# Patient Record
Sex: Female | Born: 1955 | Race: White | Hispanic: No | Smoking: Former smoker
Health system: Southern US, Community
[De-identification: ages and names within clinical notes are randomized; demographics above are authoritative.]

## PROBLEM LIST (undated history)

## (undated) DIAGNOSIS — J449 Chronic obstructive pulmonary disease, unspecified: Secondary | ICD-10-CM

## (undated) DIAGNOSIS — G709 Myoneural disorder, unspecified: Secondary | ICD-10-CM

## (undated) DIAGNOSIS — D649 Anemia, unspecified: Secondary | ICD-10-CM

## (undated) DIAGNOSIS — J189 Pneumonia, unspecified organism: Secondary | ICD-10-CM

## (undated) DIAGNOSIS — G4730 Sleep apnea, unspecified: Secondary | ICD-10-CM

## (undated) DIAGNOSIS — E162 Hypoglycemia, unspecified: Secondary | ICD-10-CM

## (undated) DIAGNOSIS — R7303 Prediabetes: Secondary | ICD-10-CM

## (undated) DIAGNOSIS — M1990 Unspecified osteoarthritis, unspecified site: Secondary | ICD-10-CM

## (undated) DIAGNOSIS — E785 Hyperlipidemia, unspecified: Secondary | ICD-10-CM

## (undated) DIAGNOSIS — I471 Supraventricular tachycardia, unspecified: Secondary | ICD-10-CM

## (undated) DIAGNOSIS — T7840XA Allergy, unspecified, initial encounter: Secondary | ICD-10-CM

## (undated) DIAGNOSIS — K5790 Diverticulosis of intestine, part unspecified, without perforation or abscess without bleeding: Secondary | ICD-10-CM

## (undated) DIAGNOSIS — M810 Age-related osteoporosis without current pathological fracture: Secondary | ICD-10-CM

## (undated) DIAGNOSIS — F329 Major depressive disorder, single episode, unspecified: Secondary | ICD-10-CM

## (undated) DIAGNOSIS — J441 Chronic obstructive pulmonary disease with (acute) exacerbation: Secondary | ICD-10-CM

## (undated) DIAGNOSIS — B029 Zoster without complications: Secondary | ICD-10-CM

## (undated) DIAGNOSIS — Z9889 Other specified postprocedural states: Secondary | ICD-10-CM

## (undated) DIAGNOSIS — I499 Cardiac arrhythmia, unspecified: Secondary | ICD-10-CM

## (undated) DIAGNOSIS — K219 Gastro-esophageal reflux disease without esophagitis: Secondary | ICD-10-CM

## (undated) DIAGNOSIS — F419 Anxiety disorder, unspecified: Secondary | ICD-10-CM

## (undated) DIAGNOSIS — K589 Irritable bowel syndrome without diarrhea: Secondary | ICD-10-CM

## (undated) DIAGNOSIS — R0600 Dyspnea, unspecified: Secondary | ICD-10-CM

## (undated) DIAGNOSIS — F32A Depression, unspecified: Secondary | ICD-10-CM

## (undated) HISTORY — PX: SPINAL FUSION: SHX223

## (undated) HISTORY — PX: BREAST SURGERY: SHX581

## (undated) HISTORY — DX: Allergy, unspecified, initial encounter: T78.40XA

## (undated) HISTORY — DX: Other specified postprocedural states: Z98.890

## (undated) HISTORY — PX: BACK SURGERY: SHX140

## (undated) HISTORY — DX: Chronic obstructive pulmonary disease with (acute) exacerbation: J44.1

## (undated) HISTORY — PX: VULVECTOMY: SHX1086

## (undated) HISTORY — DX: Myoneural disorder, unspecified: G70.9

## (undated) HISTORY — DX: Supraventricular tachycardia: I47.1

## (undated) HISTORY — DX: Unspecified osteoarthritis, unspecified site: M19.90

## (undated) HISTORY — DX: Age-related osteoporosis without current pathological fracture: M81.0

## (undated) HISTORY — DX: Supraventricular tachycardia, unspecified: I47.10

## (undated) SURGERY — COLONOSCOPY
Anesthesia: Moderate Sedation

---

## 1997-06-08 ENCOUNTER — Ambulatory Visit (HOSPITAL_COMMUNITY): Admission: RE | Admit: 1997-06-08 | Discharge: 1997-06-08 | Payer: Self-pay | Admitting: *Deleted

## 1998-07-04 ENCOUNTER — Other Ambulatory Visit: Admission: RE | Admit: 1998-07-04 | Discharge: 1998-07-04 | Payer: Self-pay | Admitting: Gynecology

## 1999-08-20 ENCOUNTER — Other Ambulatory Visit: Admission: RE | Admit: 1999-08-20 | Discharge: 1999-08-20 | Payer: Self-pay | Admitting: Gynecology

## 2002-04-12 ENCOUNTER — Other Ambulatory Visit: Admission: RE | Admit: 2002-04-12 | Discharge: 2002-04-12 | Payer: Self-pay | Admitting: Gynecology

## 2002-05-06 HISTORY — PX: CARDIAC CATHETERIZATION: SHX172

## 2003-01-28 ENCOUNTER — Encounter: Payer: Self-pay | Admitting: Family Medicine

## 2003-01-28 ENCOUNTER — Ambulatory Visit (HOSPITAL_COMMUNITY): Admission: RE | Admit: 2003-01-28 | Discharge: 2003-01-28 | Payer: Self-pay | Admitting: Family Medicine

## 2003-04-18 ENCOUNTER — Other Ambulatory Visit: Admission: RE | Admit: 2003-04-18 | Discharge: 2003-04-18 | Payer: Self-pay | Admitting: Gynecology

## 2003-05-12 ENCOUNTER — Ambulatory Visit (HOSPITAL_COMMUNITY): Admission: RE | Admit: 2003-05-12 | Discharge: 2003-05-12 | Payer: Self-pay | Admitting: Family Medicine

## 2003-07-05 ENCOUNTER — Ambulatory Visit (HOSPITAL_COMMUNITY): Admission: RE | Admit: 2003-07-05 | Discharge: 2003-07-05 | Payer: Self-pay | Admitting: *Deleted

## 2003-10-04 ENCOUNTER — Emergency Department (HOSPITAL_COMMUNITY): Admission: EM | Admit: 2003-10-04 | Discharge: 2003-10-04 | Payer: Self-pay | Admitting: Emergency Medicine

## 2004-03-26 ENCOUNTER — Encounter (INDEPENDENT_AMBULATORY_CARE_PROVIDER_SITE_OTHER): Payer: Self-pay | Admitting: Specialist

## 2004-03-26 ENCOUNTER — Ambulatory Visit (HOSPITAL_COMMUNITY): Admission: RE | Admit: 2004-03-26 | Discharge: 2004-03-26 | Payer: Self-pay | Admitting: Gynecology

## 2004-04-23 ENCOUNTER — Other Ambulatory Visit: Admission: RE | Admit: 2004-04-23 | Discharge: 2004-04-23 | Payer: Self-pay | Admitting: Gynecology

## 2004-10-30 ENCOUNTER — Ambulatory Visit (HOSPITAL_COMMUNITY): Admission: RE | Admit: 2004-10-30 | Discharge: 2004-10-30 | Payer: Self-pay | Admitting: Gynecology

## 2005-04-11 ENCOUNTER — Ambulatory Visit (HOSPITAL_COMMUNITY): Admission: RE | Admit: 2005-04-11 | Discharge: 2005-04-11 | Payer: Self-pay | Admitting: Family Medicine

## 2006-05-26 ENCOUNTER — Ambulatory Visit: Payer: Self-pay | Admitting: Orthopedic Surgery

## 2006-05-27 ENCOUNTER — Encounter (HOSPITAL_COMMUNITY): Admission: RE | Admit: 2006-05-27 | Discharge: 2006-06-26 | Payer: Self-pay | Admitting: Orthopedic Surgery

## 2006-06-10 ENCOUNTER — Inpatient Hospital Stay (HOSPITAL_COMMUNITY): Admission: EM | Admit: 2006-06-10 | Discharge: 2006-06-18 | Payer: Self-pay | Admitting: Emergency Medicine

## 2006-06-26 ENCOUNTER — Ambulatory Visit (HOSPITAL_COMMUNITY): Admission: RE | Admit: 2006-06-26 | Discharge: 2006-06-26 | Payer: Self-pay | Admitting: Internal Medicine

## 2006-06-30 ENCOUNTER — Ambulatory Visit (HOSPITAL_COMMUNITY): Admission: RE | Admit: 2006-06-30 | Discharge: 2006-06-30 | Payer: Self-pay | Admitting: Family Medicine

## 2006-08-07 ENCOUNTER — Ambulatory Visit (HOSPITAL_COMMUNITY): Admission: RE | Admit: 2006-08-07 | Discharge: 2006-08-07 | Payer: Self-pay | Admitting: Neurosurgery

## 2007-06-03 ENCOUNTER — Encounter: Admission: RE | Admit: 2007-06-03 | Discharge: 2007-06-03 | Payer: Self-pay | Admitting: Neurosurgery

## 2007-06-08 ENCOUNTER — Encounter: Payer: Self-pay | Admitting: Orthopedic Surgery

## 2007-06-11 ENCOUNTER — Ambulatory Visit (HOSPITAL_COMMUNITY): Admission: RE | Admit: 2007-06-11 | Discharge: 2007-06-12 | Payer: Self-pay | Admitting: Neurosurgery

## 2007-11-25 ENCOUNTER — Ambulatory Visit (HOSPITAL_BASED_OUTPATIENT_CLINIC_OR_DEPARTMENT_OTHER): Admission: RE | Admit: 2007-11-25 | Discharge: 2007-11-25 | Payer: Self-pay | Admitting: Gynecology

## 2007-11-25 ENCOUNTER — Encounter (INDEPENDENT_AMBULATORY_CARE_PROVIDER_SITE_OTHER): Payer: Self-pay | Admitting: Gynecology

## 2008-07-28 ENCOUNTER — Emergency Department (HOSPITAL_COMMUNITY): Admission: EM | Admit: 2008-07-28 | Discharge: 2008-07-28 | Payer: Self-pay | Admitting: Emergency Medicine

## 2008-08-28 ENCOUNTER — Emergency Department (HOSPITAL_COMMUNITY): Admission: EM | Admit: 2008-08-28 | Discharge: 2008-08-28 | Payer: Self-pay | Admitting: Emergency Medicine

## 2009-04-11 ENCOUNTER — Ambulatory Visit (HOSPITAL_COMMUNITY)
Admission: RE | Admit: 2009-04-11 | Discharge: 2009-04-11 | Payer: Self-pay | Admitting: Physical Medicine and Rehabilitation

## 2010-05-06 DIAGNOSIS — B029 Zoster without complications: Secondary | ICD-10-CM

## 2010-05-06 HISTORY — DX: Zoster without complications: B02.9

## 2010-05-27 ENCOUNTER — Encounter: Payer: Self-pay | Admitting: Internal Medicine

## 2010-07-24 ENCOUNTER — Other Ambulatory Visit (HOSPITAL_COMMUNITY): Payer: Self-pay | Admitting: Internal Medicine

## 2010-07-26 ENCOUNTER — Ambulatory Visit (HOSPITAL_COMMUNITY)
Admission: RE | Admit: 2010-07-26 | Discharge: 2010-07-26 | Disposition: A | Discharge status: Home / Self Care | Payer: Medicare Other | Source: Ambulatory Visit | Attending: Internal Medicine | Admitting: Internal Medicine

## 2010-07-26 DIAGNOSIS — I319 Disease of pericardium, unspecified: Secondary | ICD-10-CM | POA: Insufficient documentation

## 2010-07-26 DIAGNOSIS — R109 Unspecified abdominal pain: Secondary | ICD-10-CM | POA: Insufficient documentation

## 2010-08-15 LAB — POCT CARDIAC MARKERS
CKMB, poc: 1.6 ng/mL (ref 1.0–8.0)
Myoglobin, poc: 42.4 ng/mL (ref 12–200)
Troponin i, poc: <0.05 ng/mL (ref 0.00–0.09)

## 2010-08-16 LAB — DIFFERENTIAL
Lymphocytes Relative: 10 % — ABNORMAL LOW (ref 12–46)
Lymphs Abs: 2 10*3/uL (ref 0.7–4.0)
Monocytes Relative: 6 % (ref 3–12)
Neutro Abs: 16.6 10*3/uL — ABNORMAL HIGH (ref 1.7–7.7)
Neutrophils Relative %: 83 % — ABNORMAL HIGH (ref 43–77)

## 2010-08-16 LAB — BASIC METABOLIC PANEL
BUN: 7 mg/dL (ref 6–23)
Chloride: 98 mEq/L (ref 96–112)
Creatinine, Ser: 0.68 mg/dL (ref 0.4–1.2)
GFR calc Af Amer: >60 mL/min (ref >60)
GFR calc non Af Amer: >60 mL/min (ref >60)
Potassium: 3.9 mEq/L (ref 3.5–5.1)

## 2010-08-16 LAB — CBC
MCV: 96.1 fL (ref 78.0–100.0)
Platelets: 192 10*3/uL (ref 150–400)
RBC: 4.19 MIL/uL (ref 3.87–5.11)
WBC: 19.9 10*3/uL — ABNORMAL HIGH (ref 4.0–10.5)

## 2010-08-16 LAB — POCT CARDIAC MARKERS
CKMB, poc: <1 ng/mL — ABNORMAL LOW (ref 1.0–8.0)
Myoglobin, poc: 29 ng/mL (ref 12–200)
Myoglobin, poc: 31.2 ng/mL (ref 12–200)
Troponin i, poc: <0.05 ng/mL (ref 0.00–0.09)

## 2010-09-18 NOTE — Op Note (Signed)
NAMEFALISA, LAMORA                ACCOUNT NO.:  [id number]   MEDICAL RECORD NO.:  [id number]         PATIENT TYPE:  HAMB   LOCATION:                               FACILITY:  [facility]   PHYSICIAN:  Luvenia Redden, M.D.   DATE OF BIRTH:  1955/09/14   DATE OF PROCEDURE:  11/25/2007  DATE OF DISCHARGE:                               OPERATIVE REPORT   PREOPERATIVE DIAGNOSIS:  Metrorrhagia.   POSTOPERATIVE DIAGNOSES:  1. Metrorrhagia.  2. Endometrial polyps.   OPERATION:  1. Hysteroscopy.  2. Dilation and curettage.   SURGEON:  Luvenia Redden, M.D.   PROCEDURE:  Under good anesthesia, the patient was prepped and draped in  a sterile manner.  Bimanual exam revealed the uterus anterior, normal  size, and no adnexal masses.  Uterus sounded to a depth of 3-1/2 inches.  Cervix was dilated.  The hysteroscope was introduced into the cervical  canal.  Using sorbitol as the distending medium, the scope was advanced,  and the uterine cavity was observed and evaluated.  There were multiple  small polyps, and a very shaggy looking endometrium.  No evidence of any  intracavitary fibroids.   The scope was retracted, and the endocervical canal and the endometrial  canal were thoroughly curetted with a moderate amount of tissue being  obtained.  The endometrial cavity was then wiped with a dry sponge and  the procedure was terminated.  Blood loss was estimated at 25 mL, none  was replaced.  The fluid deficit was 40 mL.  The patient tolerated the  procedure well, and was removed to recovery in good condition.           ______________________________  Luvenia Redden, M.D.     WSB/MEDQ  D:  11/25/2007  T:  11/25/2007  Job:  [id number]

## 2010-09-21 NOTE — Group Therapy Note (Signed)
NAMECARAL, WHAN                ACCOUNT NO.:  [id number]   MEDICAL RECORD NO.:  [id number]          PATIENT TYPE:  INP   LOCATION:  A201                          FACILITY:  [facility]   PHYSICIAN:  Edward L. Juanetta Gosling, M.D.DATE OF BIRTH:  1956-01-06   DATE OF PROCEDURE:  DATE OF DISCHARGE:                                 PROGRESS NOTE   Patient of [facility]   SUBJECTIVE:  Ms. Belmar says she is feeling a little bit better.  She  continues to be somewhat short of breath, and does not feel as well as  she would like, but she is doing a little better.   PHYSICAL EXAMINATION:  Shows her chest is clear.  Her heart is regular.  Still with a mild tachycardia.  She still has  some prolonged expiration but she is not wheezing.  Her abdomen is soft.  Her extremities showed no edema.   ASSESSMENT:  She has chronic obstructive pulmonary disease with  exacerbation.  She is, I think, slowly improving.      Edward L. Juanetta Gosling, M.D.  Electronically Signed     ELH/MEDQ  D:  06/16/2006  T:  06/16/2006  Job:  [id number]

## 2010-09-21 NOTE — Op Note (Signed)
[name], [name]                ACCOUNT NO.:  [id number]   MEDICAL RECORD NO.:  [id number]          PATIENT TYPE:  OIB   LOCATION:  3534                         FACILITY:  [facility]   PHYSICIAN:  Cristi Loron, M.D.DATE OF BIRTH:  [birth year]   DATE OF PROCEDURE:  DATE OF DISCHARGE:  06/12/2007                               OPERATIVE REPORT   BRIEF HISTORY:  The patient is a 55 year old white female who has  suffered from neck and left arm pain consistent with left C7  radiculopathy.  She failed medical management and was worked up with a  cervical MRI, which demonstrated herniated disk at C6-C7 on the left.  I  discussed the various treatment options with the patient including  surgery.  The patient is aware of the risks, benefits, and alternatives  of surgery, so I will proceed with C6-C7 anterior cervical diskectomy,  fusion, plating, and placement of interbody prosthesis.   PREOPERATIVE DIAGNOSES:  C6-C7 herniated nucleus pulposus, spondylosis,  stenosis, cervical radiculopathy/myelopathy, and cervicalgia.   POSTOPERATIVE DIAGNOSES:  C6-C7 herniated nucleus pulposus, spondylosis,  stenosis, cervical radiculopathy/myelopathy, and cervicalgia.   PROCEDURE:  C6-C7 extensive anterior cervical diskectomy/decompression;  C6-C7 anterior interbody arthrodesis with local morselized autograft  bone Actifuse bone graft extender; C6-C7 interbody  prosthesis (Alphatec  PEEK interbody prosthesis); C6-7 anterior cervical plating with Codman  Slim-Loc  titanium plate and screws.   SURGEON:  Cristi Loron, M.D.   ASSISTANT:  Hilda Lias, M.D.   ANESTHESIA:  General endotracheal.   ESTIMATED BLOOD LOSS:  50 cc.   SPECIMENS:  None.   DRAINS:  None.   COMPLICATIONS:  None.   DESCRIPTION OF PROCEDURE:  The patient was brought to the operating room  by the anesthesia team.  General endotracheal anesthesia was induced.  The patient remained in supine position.  A roll was  placed on her  shoulders placing the neck in slight extension.  Her anterior cervical  region was then prepared with Betadine and scrubbed with Betadine  solution.  Sterile drapes were applied and then injected the area to be  incised with Marcaine with epinephrine solution.  We used a scalpel to  make a transverse incision in the patient's left anterior neck.  I used  the Metzenbaum scissors to divide the platysma muscle and then dissected  medial sternocleidomastoid muscle, jugular vein, and carotid artery.  I  carefully dissected down towards the anterior cervical spine identifying  the esophagus and retracted it medially.  We then used kitner swabs to  clear soft tissue from the anterior cervical spine and then we inserted  a bent spinal needle into the upper exposed intervertebral disk space.  We obtained intraoperative radiograph to confirm our location.   We then used electrocautery to detach the medial border of the longus  colli muscle bilaterally from C6-C7 intervertebral disk space.  We  inserted the Caspar self-retaining retractor underneath the longus colli  muscle bilaterally to provide exposure.  We then incised the C6-C7  intervertebral disk with 15-blade scalpel and performed partial  intervertebral diskectomy with pituitary forceps and Carlens curettes.  We  then inserted distraction screws into C6-C7 interspace and then used  a high-speed drill to decorticate the vertebral endplates at C6-C7  to  drill away the remainder of C6-C7 intervertebral disk, to drill away  some posterior spondylosis, and to thin out the posterior longitudinal  ligament.  We then incised the ligament with an arachnoid knife  undercutting the vertebral endplates decompressing the thecal sac.  We  then performed foraminotomies of bilateral C7 nerve root completing the  decompression.  Of note, we encountered a herniated disk and spondylosis  on the left as expected compressing the left C7 nerve  root.   Having completed the decompression, we now turned attention to the  arthrodesis.  We used trial spacers and determined to use a small PEEK  interbody prosthesis.  We prefilled the prosthesis with a combination of  local autograft bone we obtained during the decompression as well as  Actifuse bone graft extender.  We inserted the prosthesis, distracted C6-  C7 interspace and then removed distraction screws.  There was a good  snug fit of the prostheses in the interspace.   We now turned attention to the anterior instrumentation.  We used a high-  speed drill to remove some ventral spondylosis from the vertebral  endplates at C6-C7, so that the plate lay down flat.  We then selected  the appropriate-length Codman Slim-Loc anterior cervical plate, laid it  along the anterior aspect of intravertebral bodies at C6 and C7.  We  then drilled two 12-mm holes at C6-C7 and secured the plate to the  vertebral bodies by placing two 12 mm self-tapping screws to C6-C7.  We  obtained intraoperative radiograph.  There was limited visualization of  the plate screws and interbody prostheses, but they look good in vivo.  We therefore secured the screws and plate by locking each again.   We then obtained hemostasis using bipolar cautery, irrigated the wound  out with bacitracin solution, and removed the retractor.  We then re-  inspected the esophagus for any damage, there was none apparent.  We  then reapproximated the patient's platysmal muscle with interrupted 3-0  Vicryl suture, subcutaneous tissue with interrupted 3-0 Vicryl suture,  and the skin with Steri-Strips and Benzoin.  The wound was then coated  with bacitracin ointment.  Sterile dressing applied.  The drapes were  removed.  The patient was subsequently extubated by the anesthesia team  and transported to post-anesthesia care unit in stable condition.  All  sponge, instrument, and needle counts were correct at the end of the   case.      Cristi Loron, M.D.  Electronically Signed     JDJ/MEDQ  D:  06/15/2007  T:  06/16/2007  Job:  [id number]

## 2010-09-21 NOTE — Cardiovascular Report (Signed)
NAME:  [name], [name]                          ACCOUNT NO.:  [id number]   MEDICAL RECORD NO.:  [id number]                   PATIENT TYPE:  OIB   LOCATION:  2854                                 FACILITY:  [facility]   PHYSICIAN:  Darlin Priestly, M.D.             DATE OF BIRTH:  [birth year]   DATE OF PROCEDURE:  07/05/2003  DATE OF DISCHARGE:  07/05/2003                              CARDIAC CATHETERIZATION   PROCEDURES:  1. Left heart catheterization.  2. Coronary angiography.  3. Left ventriculogram.  4. Abdominal aortogram.   ATTENDING PHYSICIAN:  Darlin Priestly, M.D.   COMPLICATIONS:  None.   INDICATIONS:  [name] is a 55 year old female patient of Dr. 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  She did undergo a dobutamine  Cardiolite which revealed evidence of mild anterior wall thinning with  suggestion of mild anterior wall ischemia toward the apex and normal EF.  She is now referred for cardiac catheterization to define her coronary  anatomy.   DESCRIPTION OF OPERATION:  After obtaining informed written consent, the  patient was brought to the cardiac catheterization laboratory.  Right and  left groin was shaved and prepped and draped in the usual sterile fashion.  ECG monitor was established.  Using the modified Seldinger technique, a 6  French arterial sheath was inserted in the right femoral artery. A 6 French  diagnostic catheter was then used to performed diagnostic angiography.  This  reveals a medium size short left main with no significant disease.  The LAD  is a large vessel that coursed to the apex and gives rise to one diagonal  branch and one large septal perforator.  The LAD has no significant disease.  First diagonal is a medium size vessel  which bifurcates distally with no  significant disease.  There is a medium size septal perforator with a 50%  ostial lesion.   Left circumflex is a large vessel that coursed in the AV groove and gives  rise to three obtuse marginal branches.  The AV groove circumflex has no  significant disease.   The first OM is a large vessel which bifurcates distally with no significant  disease.  Second and third OMs are small vessels with no significant  disease.   The right coronary artery is a large vessel.  It is dominant.  It gives rise  to both PDA as well as posterior lateral branch.  There is mild 30% proximal  RCA narrowing.  The PDA and posterior lateral branch have no significant  disease.   Left ventriculogram reveals a preserved EF at 60%.   Abdominal aortogram reveals minimal distal aortic plaque with widely patent  renal arteries.   HEMODYNAMICS:  Systemic arterial pressure 117/75, LV pressure  118/9, LVEDP  of 13.   CONCLUSIONS:  1. No significant coronary artery disease.  2. Normal left ventricular systolic function.  3. No evidence of renal artery stenosis with minimal plaque in the distal     descending aorta.                                               Darlin Priestly, M.D.    RHM/MEDQ  D:  07/05/2003  T:  07/06/2003  Job:  [id number]   cc:   Corrie Mckusick, M.D.  [address] [place] [place]  [place]  [place] [address]  Fax: [phone]

## 2010-09-21 NOTE — Op Note (Signed)
NAMEANNICE, JOLLY                ACCOUNT NO.:  [id number]   MEDICAL RECORD NO.:  [id number]          PATIENT TYPE:  INP   LOCATION:  A201                          FACILITY:  [facility]   PHYSICIAN:  Ky Barban, M.D.DATE OF BIRTH:  1955-07-05   DATE OF PROCEDURE:  06/17/2006  DATE OF DISCHARGE:                               OPERATIVE REPORT   PREOPERATIVE DIAGNOSES:  Difficulty to void and hesitancy.   POSTOPERATIVE DIAGNOSES:  Difficulty to void and hesitancy.   PROCEDURE:  Cystoscopy, dilatation.   SURGEON:  Ky Barban, M.D.   DESCRIPTION OF PROCEDURE:  The patient was given general endotracheal  anesthesia and placed in the lithotomy position.  After the usual prep  and drape, a #25 cystoscope was introduced.  The urethra was tight so I  dilated it to a 30-French, and the cystoscope was then introduced.  The  bladder was inspected.  No tumor, stone or foreign body or inflammation.  Both orifices were located and were of normal size with clear efflux.  The urethra also looked normal.  The cystoscope was removed.  The  urethra was dilated with a 32-French.  A bimanual pelvic exam was  unremarkable.   The patient left the operating room in satisfactory condition.      Ky Barban, M.D.  Electronically Signed     MIJ/MEDQ  D:  06/17/2006  T:  06/17/2006  Job:  [id number]

## 2010-09-21 NOTE — Op Note (Signed)
[name], [name]                ACCOUNT NO.:  [id number]   MEDICAL RECORD NO.:  [id number]          PATIENT TYPE:  AMB   LOCATION:  SDC                           FACILITY:  [facility]   PHYSICIAN:  Luvenia Redden, M.D.   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   OPERATION:  Incision and destruction of chronic draining sinus tracts.   SURGEON:  Luvenia Redden, M.D.   PROCEDURE:  Under good sedation, the area of the left vulva was injected  with 1% lidocaine to achieve anesthesia.  There were two sinus tracts with  pinpoint openings.  These were probed.  The first was about a centimeter  long and the second one was about 1.5 cm long to probe.  These areas were  opened with a small iris scissors to open up the tract completely.  The  tract was then coagulated with electrocoagulation.  The wound was then  packed with Neosporin ointment copiously and it will be left to heal by a  second intention.  No dressing was applied.  Blood loss was nil.  The  procedure was terminated and the patient was removed to recovery in good  condition.       WSB/MEDQ  D:  10/30/2004  T:  10/30/2004  Job:  [id number]

## 2010-09-21 NOTE — Op Note (Signed)
[name], [name]                ACCOUNT NO.:  [id number]   MEDICAL RECORD NO.:  [id number]          PATIENT TYPE:  AMB   LOCATION:  SDC                           FACILITY[facility]   PHYSICIAN:  Luvenia Redden, M.D.   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   OPERATION:  Excision of sinus tract and chronic draining vulvar abscess.   SURGEON:  Luvenia Redden, M.D.   PROCEDURE IN DETAIL:  Under good anesthesia, the patient was prepped and  draped in a sterile manner.  The sinus tract was probed with small forceps  and actually was probed to depth of approximately 1-1/2 inch.  This was an  awful lot longer than our exam revealed in the office but at any rate, the  depth of the tract as ascertained.  Following this, a tract was opened  sharply with a pair of small scissors.  Once the extent of the region could  then be determined, the entire tract was excised completely using a scalpel.  This left a defect approximately 1-3/4 inches in length on the left labia  extending down into the subcutaneous tissues.  Bleeders were  electrocoagulated.  The area was irrigated copiously with warm saline.  Additional bleeders were coagulated.  The subcutaneous tissue was then  closed using layers of interrupted 4-0 Vicryl suture.  The skin was then  closed using 4-0 Vicryl horizontal mattress sutures to close the dead space  and close the skin.  The area was again irrigated with saline, 1% lidocaine  with some epinephrine was then injected locally for pain control.  Estimated  blood loss was about 50 mL.  None was replaced.  Specimen was sent to  pathology for examination.  The patient tolerated the procedure well.  She  was moved to recovery in good condition.      WSB/MEDQ  D:  03/26/2004  T:  03/26/2004  Job:  [id number]

## 2010-09-21 NOTE — Group Therapy Note (Signed)
[name], [name]                ACCOUNT NO.:  [id number]   MEDICAL RECORD NO.:  [id number]          PATIENT TYPE:  INP   LOCATION:  A201                          FACILITY:  [facility]   PHYSICIAN:  Edward L. Juanetta Gosling, M.D.DATE OF BIRTH:  1955-08-21   DATE OF PROCEDURE:  DATE OF DISCHARGE:                                 PROGRESS NOTE   Ms. [name] says she is feeling a little better, still very tired when she  moves around.   Her exam shows temperature is 98.9, pulse 93, respirations 18, blood  pressure 109/62, 02 saturation is 94% on room air.  Her chest is clear.  Heart is regular.  Abdomen is soft.   White count is down to 16,100, platelets 287.  Her electrolytes are  normal with the exception of a C02 of 33.   ASSESSMENT:  I think she is overall better.   PLAN:  I am going to try to get her up and moving and see what happens.  She complains of being very tired when she gets up.  I want to see if  she develops hypoxia with ambulation.      Edward L. Juanetta Gosling, M.D.  Electronically Signed     ELH/MEDQ  D:  06/17/2006  T:  06/17/2006  Job:  [id number]

## 2010-09-21 NOTE — Consult Note (Signed)
NAMEGEANA, LELLA                ACCOUNT NO.:  [id number]   MEDICAL RECORD NO.:  [id number]          PATIENT TYPE:  INP   LOCATION:  A201                          FACILITY:  [facility]   PHYSICIAN:  Edward L. Juanetta Gosling, M.D.DATE OF BIRTH:  03/31/1956   DATE OF CONSULTATION:  DATE OF DISCHARGE:                                 CONSULTATION   REASON FOR CONSULTATION:  COPD.   HISTORY:  Ms. Stefanick is a 55 year old who has had shortness of breath for  about a week or so.  She had been having fever at home last week but  none this.  She has not had any hemoptysis.  She did have some right  shoulder pain in December, and it was felt to be some sort of a  arthritic problem.  She has been trying to stop smoking using Nicoderm  patches.  Almost immediately before I arrived she had an episode of  supraventricular tachycardia which apparently she has had in the past  and said it felt a little different made her feel like she was going to  pass out.  Her heart rate jumped up into the 130 range.  It had been in  the 100 range.  She has been on theophylline.  She has been on  nebulizers.   PAST MEDICAL HISTORY:  Is positive for:  1. SVT.  2. COPD and asthma exacerbations.  3. Depression.  4. Hyperlipidemia.   SOCIAL HISTORY:  She does have a history of smoking cigarettes but is  working on reducing her consumption of cigarettes.   FAMILY HISTORY:  Her mother had COPD and congestive heart failure,  severe, and eventually died of complications from an MI.  She has not  had any complaints with her legs, except her ankles were somewhat  painful yesterday.   PHYSICAL EXAMINATION:  GENERAL:  She looks tachypneic.  VITAL SIGNS:  Her heart rate is about 120-130.  Her respirations are  rate of 18, blood pressure 106/64.  She is afebrile.  O2 sat 91% on room  air.  HEENT:  Her pupils are reactive.  Her nose and throat are clear.  NECK:  Supple without masses.  No bruits.  No jugular venous distension.  CHEST:  Pretty clear without wheezes, rales or rhonchi.  Her chest shows  decreased breath sounds and wheezing bilaterally.  HEART:  Regular with a tachycardia.  EXTREMITIES:  Showed no edema.  ABDOMEN:  Soft.   Her white blood count 24,300, hemoglobin 14.3, platelets 344.  D-dimer  on admission was 0.22, normal.  Electrolytes are normal.  BUN 13,  creatinine 0.7.  Theophylline level this morning was 9.8.   ASSESSMENT:  1. She has chronic obstructive pulmonary disease.  2. She has had episodes of supraventricular tachycardia in the past.      She has what looks like a sinus tach now.   However, I think we should go ahead do a ventilation/perfusion lung  scan.  I think she is low probability.  But I will go ahead and get a  TSH, get an EKG, a cardiac panel, and  make sure, since she did have a  near syncope with this episode.  I have discussed her situation with Dr.  Sherwood Gambler.      Ramon Dredge L. Juanetta Gosling, M.D.  Electronically Signed     ELH/MEDQ  D:  06/13/2006  T:  06/13/2006  Job:  [id number]

## 2010-09-21 NOTE — H&P (Signed)
NAME:  [name], [name] NO.:  [id number]   MEDICAL RECORD NO.:  [id number]                   PATIENT TYPE:  OIB   LOCATION:  2854                                 FACILITY:  [facility]   PHYSICIAN:  Darlin Priestly, M.D.             DATE OF BIRTH:  1955-08-01   DATE OF ADMISSION:  07/05/2003  DATE OF DISCHARGE:                                HISTORY & PHYSICAL   Ms. Kusel is a 55 year old female followed by Dr. Assunta Found who was  referred to Dr. Jenne Campus on June 01, 2003, for evaluation of  PSVT.  She  has a history of COPD.  She has had intermittent episodes of a heart rate of  170.  She was seen by Dr. Jenne Campus June 01, 2003.  She was set up for  echocardiogram  and Cardiolite study. Cardiolite study done June 14, 2003, revealed suggestion of anterior wall ischemia with good LV function.  Echocardiogram  done June 14, 2003, revealed normal LV function.  She is  admitted now for diagnostic catheterization. She would have had this sooner  except she had an upper respiratory tract infection, apparently she has  fairly significant asthmatic type COPD.  She denies any fever at the last 48  hours.   PAST MEDICAL HISTORY:  1. COPD.  2. PSVT.   ALLERGIES:  1. PENICILLIN.  2. IV CONTRAST which caused her to have hypotension and nausea and vomiting.   SOCIAL HISTORY:  She is divorced. She has two daughters, both of which were  at medical school.  She works at [employer] area of the [place].  She smokes half a pack a day.  She denies alcohol.   FAMILY HISTORY:  The patient's father is alive at 2 with COPD.  Mother is  alive at 65 with diabetes.  She has one sister in good health.   REVIEW OF SYMPTOMS:  Essentially unremarkable except for as noted above.  She denies any claudication.   PHYSICAL EXAMINATION:  GENERAL APPEARANCE:  She is a well-developed, well-  nourished female in no acute distress.  VITAL SIGNS:  Blood pressure  121/73, pulse 92, temperature 98.6.  HEENT:  Normocephalic.  Extraocular movements intact.  Sclerae are  anicteric.  Lids and conjunctivae within normal limits.  NECK:  Without JVD, without bruit.  CHEST:  Clear to auscultation and percussion.  There is no wheezing.  CARDIOVASCULAR:  Regular rate and rhythm without murmurs, rubs, or gallops.  Normal S1 and S2.  ABDOMEN:  Nontender, no hepatosplenomegaly.  EXTREMITIES:  Without edema.  Distal pulses are diminished bilaterally.  Could not appreciate femoral bruits.  NEUROLOGIC:  Grossly intact.  She is awake, alert and oriented and  cooperative.  She moves lower extremities without obvious deficit.   EKG from the office showed sinus rhythm without acute changes.   IMPRESSION:  1. Abnormal Cardiolite study, rule out cornua disease.  2. History of paroxysmal supraventricular tachycardia.  3. Asthmatic chronic obstructive pulmonary disease.  4. IV contrast allergy and penicillin allergy.  5. Question of peripheral vascular disease with diminished distal pulses on     examination.   PLAN:  The patient is to be admitted for diagnostic catheterization. We did  order some Tussionex.  She has been premedicated with steroids, Pepcid and  Benadryl.  Will also check a lipid panel.      Abelino Derrick, P.A.                      Darlin Priestly, M.D.    Lenard Lance  D:  07/05/2003  T:  07/05/2003  Job:  [id number]

## 2010-09-21 NOTE — Group Therapy Note (Signed)
NAMEMARGART, [name]                ACCOUNT NO.:  [id number]   MEDICAL RECORD NO.:  [id number]          PATIENT TYPE:  INP   LOCATION:  A201                          FACILITY:  [facility]   PHYSICIAN:  Edward L. Juanetta Gosling, M.D.DATE OF BIRTH:  Sep 06, 1955   DATE OF PROCEDURE:  DATE OF DISCHARGE:                                 PROGRESS NOTE   PROBLEM:  COPD.  A patient Dr. Sherwood Gambler.  [name] is overall about the  same.  She had some sort of an allergic reaction this morning.  She says  she feels a little bit less congested and she is having a little bit  less symptoms otherwise.   EXAM:  She is awake and alert.  Temperature is 97.7, pulse 81, respirations 20, blood pressure 98/58, O2  sats 96% on 2L.  Her chest is clearer.  She is moving air better.   ASSESSMENT:  She is improving.   PLAN:  Continue current treatments and medications, and follow.      Edward L. Juanetta Gosling, M.D.  Electronically Signed     ELH/MEDQ  D:  06/15/2006  T:  06/15/2006  Job:  [id number]

## 2010-09-21 NOTE — Group Therapy Note (Signed)
[name], [name]                ACCOUNT NO.:  [id number]   MEDICAL RECORD NO.:  [id number]          PATIENT TYPE:  INP   LOCATION:  A201                          FACILITY:  [facility]   PHYSICIAN:  Edward L. Juanetta Gosling, M.D.DATE OF BIRTH:  08-05-1955   DATE OF PROCEDURE:  DATE OF DISCHARGE:                                 PROGRESS NOTE   Ms. Pherigo is doing a little better I think.  She still has an elevated  heart rate about 116, but she feels a little bit better.  She is moving  air better.  Her chest is clearer than it was.  She says she is worried  about taking the albuterol tablet, because she said something she took  in pill form, and she seems to think it may have an albuterol, caused  her throat to swell in the past, so I have gone ahead and discontinued  that, even though it has just been ordered by Dr. Sherwood Gambler.  Otherwise, she  is awake and alert.  Temperature is 98.2, pulse was 90, now about 116,  respirations 24, blood pressure 110/63, O2 SATs 97% on 2 liters.  White  count is down to 19,300.  Electrolytes are normal.  Cardiac enzymes are  negative.  TSH normal.  Theophylline level was only 3.9.   ASSESSMENT:  I have think she is slowly improving.  Plan is to continue  with treatments and follow.      Edward L. Juanetta Gosling, M.D.  Electronically Signed     ELH/MEDQ  D:  06/14/2006  T:  06/14/2006  Job:  [id number]

## 2010-09-21 NOTE — Discharge Summary (Signed)
NAMESANDY, HAYE                ACCOUNT NO.:  [id number]   MEDICAL RECORD NO.:  [id number]          PATIENT TYPE:  INP   LOCATION:  A201                          FACILITY:  [facility]   PHYSICIAN:  Madelin Rear. Sherwood Gambler, MD  DATE OF BIRTH:  1955-07-09   DATE OF ADMISSION:  06/10/2006  DATE OF DISCHARGE:  02/13/2008LH                               DISCHARGE SUMMARY   DISCHARGE DIAGNOSES:  1. Acute exacerbation of chronic obstructive pulmonary disease,      severe.  2. Tobacco abuse.  3. Depression.  4. Hyperlipidemia.  5. Cardiac arrhythmia.  6. Adverse effects to medication.  7. Urethral stricture.  8. Stress incontinence.   DISCHARGE MEDICATIONS:  1. Advair 50/250 one inhalation b.i.d.  2. Lexapro 20 mg p.o. daily.  3. Aspirin 81 mg p.o. daily.  4. Vytorin 10/40 p.o. daily.  5. Zyrtec 10 daily p.r.n.  6. Spiriva 1 inhalation daily.  7. Nystatin p.o. q.i.d.  8. Xopenex nebulizer 1.25 nebulizer q.i.d.  9. Tussionex p.o. q.i.d. one teaspoon.  10.Advil p.r.n.  11.Prednisone 20 mg p.o. daily times one week.  12.Nicotine patch.  13.Xanax 0.25 mg p.o. q.i.d. p.r.n. anxiety.   SUMMARY:  The patient was admitted with severe acute dyspnea.  She was  medicated at home and was unsuccessful in resolving her symptoms.  On  the day of presentation, she was extremely dyspneic in the emergency  department and failed to resolve with nebulizers there.   PAST MEDICAL HISTORY:  1. Notable for SVTs in the past.  2. Chronic obstructive pulmonary disease.  3. Asthma exacerbation.  4. Depression.  5. Hyperlipidemia.   Her response to bronchodilators parenterally, as well as by inhalation,  as well as high-dose IV steroids, was one of gradual improvement in  house.  She was seen in consultation by pulmonology with my gratitude.  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  This was  secondary to urethral stricture.  On a  gradual day-by-day basis, she did improve from a pulmonary standpoint.  She was discharged in stable condition on the above aforementioned  medications and will follow-up in the office 1 week post discharge.      Madelin Rear. Sherwood Gambler, MD  Electronically Signed     LJF/MEDQ  D:  07/16/2006  T:  07/17/2006  Job:  [id number]

## 2010-09-21 NOTE — H&P (Signed)
[name], [name]                ACCOUNT NO.:  [id number]   MEDICAL RECORD NO.:  [id number]          PATIENT TYPE:  INP   LOCATION:  A201                          FACILITY:  [facility]   PHYSICIAN:  Madelin Rear. Sherwood Gambler, MD  DATE OF BIRTH:  [birth year]   DATE OF ADMISSION:  06/10/2006  DATE OF DISCHARGE:  LH                              HISTORY & PHYSICAL   CHIEF COMPLAINT:  Shortness of breath.   HISTORY OF PRESENT ILLNESS:  The patient had approximately one week of  progressively increasing shortness of breath.  She has been attempting  to quit smoking with the use of Nicoderm patches.  She has cut down her  consumption.  She admits to a cough but no fever, rigors, or chills.  No  hemoptysis.   PAST MEDICAL HISTORY:  She had SVT, previous COPD/asthma exacerbations,  depression, and hyperlipidemia.   SOCIAL HISTORY:  Positive for cigarette smoking as outlined above, she  is decreasing consumption.   FAMILY HISTORY:  Noncontributory.   REVIEW OF SYSTEMS:  In detail is outlined as under HPI, all else is  negative.  Specifically, she denied any leg swelling or DVT symptoms.   PHYSICAL EXAMINATION:  GENERAL:  She is tachypneic with accessory muscle  use and paradoxical respirations.  HEAD AND NECK:  No JVD or adenopathy.  Neck is supple.  CHEST:  Diffuse wheezing, prolonged expiratory phase, with obvious  obstructive symptoms.  No rales.  CARDIAC:  Regular rhythm, distant, no gallop or rub appreciated.  ABDOMEN:  Benign.  EXTREMITIES:  No cyanosis, clubbing, and edema.  NEUROLOGICAL:  Exam nonfocal.  PSYCHIATRIC:  Normal, alert and cooperative.   LABORATORY DATA:  Chest x-ray shows marked COPD changes but no  infiltrate or other acute pathology.  Lab work revealed an extremely  elevated white count greater than 20,000 at 24.3.  She had a normal  platelet count and normal H&H, positive left shift with 93% polys.  Electrolytes were unrevealing.  Arterial blood gas revealed pH 7.43,  pCO2 43.7, pO2 75.5 with oxygen saturation of 94%.  This was on FIO2 of  28% (2 liters nasal cannula).   IMPRESSION/PLAN:  1. The patient will be admitted for more aggressive bronchodilators,      IV bronchodilators, around the clock nebs.  Antibiotic empiric      coverage with a white count so high.  This may be steroid affect,      however, better safe than      sorry.  2. Hyperlipidemia, no acute intervention at present.  3. Depression.  Continue her Lexapro as ordered at home, 20 mg daily.      Madelin Rear. Sherwood Gambler, MD  Electronically Signed     LJF/MEDQ  D:  06/10/2006  T:  06/10/2006  Job:  [id number]

## 2010-09-21 NOTE — Consult Note (Signed)
NAMEJESSA, [name]                ACCOUNT NO.:  [id number]   MEDICAL RECORD NO.:  [id number]          PATIENT TYPE:  INP   LOCATION:  A201                          FACILITY:  [facility]   PHYSICIAN:  Ky Barban, M.D.DATE OF BIRTH:  26-Jul-1955   DATE OF CONSULTATION:  DATE OF DISCHARGE:                                 CONSULTATION   HISTORY OF PRESENT ILLNESS:  A 55 year old female who has difficulty  breathing. She was having shortness of breath, episodes of coughing, and  she was trying to quit smoking. Her coughing got worse, so she was  admitted primarily for that but she also complains of having a lot of  stress incontinence, going on for 1 week. As the cough was getting  better, the stress incontinence was getting better. At the same time,  she complains of difficulty voiding, no fever, chills, or gross  hematuria.   PAST MEDICAL HISTORY:  Includes COPD, asthma exacerbation, depression.   PHYSICAL EXAMINATION:  GENERAL:  A well developed, well nourished,  female not in acute distress. Fully conscious, alert, oriented.  VITAL SIGNS:  Blood pressure 130/80.  ABDOMEN:  Soft, flat. Liver, spleen, and kidney's not palpable.   LABORATORY DATA:  White blood cell count 12.9. Hematocrit 37.9. Sodium  140, potassium 3.6, chloride 101, CO2 33.   IMPRESSION/PLAN:  Difficulty to void, probably __________ stenosis. She  want to get it done while she is here in the hospital and wants to have  it done under anesthesia, so I will schedule it for Tuesday. __________  Dr. Sherwood Gambler.      Ky Barban, M.D.  Electronically Signed     MIJ/MEDQ  D:  06/15/2006  T:  06/15/2006  Job:  [id number]   cc:   Madelin Rear. Sherwood Gambler, MD  Fax: [phone]

## 2010-10-15 ENCOUNTER — Other Ambulatory Visit (HOSPITAL_COMMUNITY): Payer: Self-pay | Admitting: Physical Medicine and Rehabilitation

## 2010-10-15 DIAGNOSIS — R52 Pain, unspecified: Secondary | ICD-10-CM

## 2010-10-19 ENCOUNTER — Ambulatory Visit (HOSPITAL_COMMUNITY)
Admission: RE | Admit: 2010-10-19 | Discharge: 2010-10-19 | Disposition: A | Discharge status: Home / Self Care | Payer: Medicare Other | Source: Ambulatory Visit | Attending: Physical Medicine and Rehabilitation | Admitting: Physical Medicine and Rehabilitation

## 2010-10-19 DIAGNOSIS — M5020 Other cervical disc displacement, unspecified cervical region: Secondary | ICD-10-CM | POA: Insufficient documentation

## 2010-10-19 DIAGNOSIS — M542 Cervicalgia: Secondary | ICD-10-CM | POA: Insufficient documentation

## 2010-10-19 DIAGNOSIS — R52 Pain, unspecified: Secondary | ICD-10-CM

## 2010-10-19 DIAGNOSIS — M25519 Pain in unspecified shoulder: Secondary | ICD-10-CM | POA: Insufficient documentation

## 2010-10-19 MED ORDER — GADOBENATE DIMEGLUMINE 529 MG/ML IV SOLN: 10.0000 mL | Freq: Once | INTRAVENOUS | Status: AC | PRN

## 2010-12-10 ENCOUNTER — Encounter (HOSPITAL_COMMUNITY)
Admission: RE | Admit: 2010-12-10 | Discharge: 2010-12-10 | Disposition: A | Discharge status: Home / Self Care | Payer: Medicare Other | Source: Ambulatory Visit | Attending: Neurosurgery | Admitting: Neurosurgery

## 2010-12-10 LAB — BASIC METABOLIC PANEL
BUN: 10 mg/dL (ref 6–23)
Calcium: 9.4 mg/dL (ref 8.4–10.5)
Chloride: 98 mEq/L (ref 96–112)
Creatinine, Ser: 0.57 mg/dL (ref 0.50–1.10)
GFR calc Af Amer: >60 mL/min (ref >60)

## 2010-12-10 LAB — CBC
HCT: 37.3 % (ref 36.0–46.0)
MCH: 32.2 pg (ref 26.0–34.0)
MCHC: 34.3 g/dL (ref 30.0–36.0)
MCV: 94 fL (ref 78.0–100.0)
Platelets: 222 10*3/uL (ref 150–400)
RDW: 12.3 % (ref 11.5–15.5)
WBC: 10.6 10*3/uL — ABNORMAL HIGH (ref 4.0–10.5)

## 2010-12-17 ENCOUNTER — Inpatient Hospital Stay (HOSPITAL_COMMUNITY)
Admission: RE | Admit: 2010-12-17 | Discharge: 2010-12-18 | DRG: 472 | Disposition: A | Discharge status: Home / Self Care | Payer: Medicare Other | Source: Ambulatory Visit | Attending: Neurosurgery | Admitting: Neurosurgery

## 2010-12-17 ENCOUNTER — Inpatient Hospital Stay (HOSPITAL_COMMUNITY): Payer: Medicare Other

## 2010-12-17 DIAGNOSIS — Z79899 Other long term (current) drug therapy: Secondary | ICD-10-CM

## 2010-12-17 DIAGNOSIS — J4489 Other specified chronic obstructive pulmonary disease: Secondary | ICD-10-CM | POA: Diagnosis present

## 2010-12-17 DIAGNOSIS — Z981 Arthrodesis status: Secondary | ICD-10-CM

## 2010-12-17 DIAGNOSIS — Z01812 Encounter for preprocedural laboratory examination: Secondary | ICD-10-CM

## 2010-12-17 DIAGNOSIS — M4712 Other spondylosis with myelopathy, cervical region: Secondary | ICD-10-CM | POA: Diagnosis present

## 2010-12-17 DIAGNOSIS — M5000 Cervical disc disorder with myelopathy, unspecified cervical region: Principal | ICD-10-CM | POA: Diagnosis present

## 2010-12-17 DIAGNOSIS — Z7982 Long term (current) use of aspirin: Secondary | ICD-10-CM

## 2010-12-17 DIAGNOSIS — J449 Chronic obstructive pulmonary disease, unspecified: Secondary | ICD-10-CM | POA: Diagnosis present

## 2010-12-17 DIAGNOSIS — Z472 Encounter for removal of internal fixation device: Secondary | ICD-10-CM

## 2010-12-18 NOTE — Op Note (Signed)
[name], [name] NO.:  [id number]  MEDICAL RECORD NO.:  [id number]  LOCATION:  3309                         FACILITY:  [facility]  PHYSICIAN:  Cristi Loron, M.D.DATE OF BIRTH:  02-Dec-1955  DATE OF PROCEDURE:  12/17/2010 DATE OF DISCHARGE:                              OPERATIVE REPORT   BRIEF HISTORY:  The patient is a 55 year old white female, who I performed a C6-7 anterior cervical diskectomy, fusion and plating a few years ago.  The patient has developed recurrent neck and left shoulder and arm pain.  She has failed medical management, was worked up with the cervical MRI, which demonstrated the patient has a herniated disk at C5 and spondylosis/stenosis at C5-6.  These symptoms were consistent with a left C5 and/or C6 radiculopathy.  I discussed the various treatment options with the patient including surgery.  She has weighed the risks, benefits, and alternatives of surgery and desired to proceed with a exploration and cervical fusion with the C4-5, C5-6 anterior cervical diskectomy, fusion, and plating.  PREOPERATIVE DIAGNOSES: 1. 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 2. C5-6 spondylosis, stenosis. 3. Cervicalgia. 4. Cervical radiculopathy/myelopathy.  POSTOPERATIVE DIAGNOSES: 1. 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 2. C5-6 spondylosis, stenosis. 3. Cervicalgia. 4. Cervical radiculopathy/myelopathy.  PROCEDURES:  Exploration of cervical fusion with removal of plate at C6- 7; C4-5 and C5-6 extensive anterior cervical diskectomy/decompression; C4-5, C5-6 anterior interbody arthrodesis with local morselized autograft bone; and Actifuse bone graft extender; insertion of interbody prosthesis at C4-5 and C5-6 (Zimmer PEEK interbody prosthesis); intracervical plating C4-C6 with globus titanium plate and screws.  SURGEON:  Cristi Loron, MD.  ASSISTANT:  Hilda Lias, MD.  ANESTHESIA:  General endotracheal.  ESTIMATED BLOOD LOSS:  100  mL.  SPECIMENS:  None.  DRAINS:  None.  COMPLICATIONS:  None.  DESCRIPTION OF PROCEDURE:  The patient was brought to the operating room by the anesthesia team and general endotracheal anesthesia was induced. The patient remained in supine position.  A roll was placed under shoulders to keep her neck in the neutral position.  Her anterior cervical region was then prepared with Betadine scrub and Betadine solution.  Sterile drapes were applied.  I then injected the area to be incised with Marcaine with epinephrine solution.  I used a scalpel to make a transverse incision in the patient's left anterior neck.  I then used the Metzenbaum scissors to divide the platysma muscle and then to dissect medial to sternocleidomastoid muscle, jugular vein, and carotid artery.  We carefully dissected through the scar tissue from prior operation.  We used Kittner swabs to clear soft tissue from the anterior cervical spine exposing the old plate by use of 15 blade scalpel to incise the scar tissue over the old plate exposing the old screws.  I then used a cam tightener to align the screws and then the screwdriver to remove the screws.  We then removed the plate and inspected the arthrodesis at C6-7, it appeared to be solid.  We now turned our attention to the decompression.  I incised the C4-5 and C5-6 intervertebral disk with 15-blade scalpel and performed a partial intervertebral diskectomy with the pituitary forceps and Carlen curettes.  I  then inserted distraction screws at C4 interspace.  I then used high-speed drill to decorticate the vertebral endplates of C4-5, to drill away the remaining C4-5 intervertebral disk, to drill away some posterior spondylosis, and to thin out the posterior longitudinal ligament.  I then incised the ligament with arachnoid knife and then removed it with Kerrison punch undercutting the vertebral endplates, decompressing the thecal sac, and then performed  foraminotomy about the bilateral C5 nerve root completing the decompression.  Of note, we encountered a herniated disk on the left as expected, removed with Kerrison punch and pituitary forceps, decompressing the left C5 nerve roots.  We then repeated this procedure in an analogous fashion at C5-6 thecal sac and bilateral C6 nerve roots.  Having completed the decompression, we now turned our attention to arthrodesis.  I used a trial spacers and determined to use a 6-mm prosthesis at C4-5 and a 5-mm prosthesis at C5-6.  We prefilled this prosthesis with combination of local autograft bone we obtained during the decompression as well as above bone graft extender.  We inserted prosthesis and distracted interspaces at C4-5 and C5-6 and then removed distraction screws.  There was a good snug fit of prosthesis in the interspace at both levels.  We now turned our attention to the anterior spinal instrumentation.  We selected appropriate length globus anterior cervical plate laid along the anterior aspect of the vertebral process at C4-C6.  We used one of the old holes at C6 and secured the plate in place with a 12-mm self- tapping screw.  We also drilled new holes at C6 and again placed a 12-mm self-tapping screw.  We drilled two 12-mm holes at C4-C5 and secured the plate to the vertebra with two 12-mm self-tapping screws.  We then obtained intraoperative radiograph that demonstrated good position of the instrumentation.  We therefore secured the screws and plate by locking each cam.  This completed the instrumentation.  We then obtained hemostasis using bipolar electrocautery.  We irrigated the wound out with bacitracin solution.  We then removed the retractor. We inspected the esophagus for any damage, there was none apparent.  We then reapproximated the patient's platysma muscle with interrupted 3-0 Vicryl suture, subcutaneous tissue with interrupted 3-0 Vicryl suture, and the skin with  Steri-Strips and Benzoin.  The wound was then coated with bacitracin ointment and sterile dressing applied.  The drapes were removed, and the patient was subsequently extubated by the Anesthesia Team and transported to the Post Anesthesia Care Unit in stable condition.  All sponge, instrument, and needle counts were correct at the end of this case.     Cristi Loron, M.D.     JDJ/MEDQ  D:  12/17/2010  T:  12/18/2010  Job:  [id number]  Electronically Signed by Tressie Stalker M.D. on 12/18/2010 08:27:48 PM

## 2011-01-25 LAB — BASIC METABOLIC PANEL
BUN: 5 — ABNORMAL LOW
CO2: 26
Calcium: 8.1 — ABNORMAL LOW
Glucose, Bld: 94
Sodium: 132 — ABNORMAL LOW

## 2011-01-25 LAB — CBC
HCT: 38.8
Hemoglobin: 13.2
MCHC: 33.9
Platelets: 264
RDW: 13.1

## 2011-01-29 ENCOUNTER — Emergency Department (HOSPITAL_COMMUNITY): Payer: Medicare Other

## 2011-01-29 ENCOUNTER — Emergency Department (HOSPITAL_COMMUNITY)
Admission: EM | Admit: 2011-01-29 | Discharge: 2011-01-29 | Disposition: A | Discharge status: Home / Self Care | Payer: Medicare Other | Attending: Emergency Medicine | Admitting: Emergency Medicine

## 2011-01-29 DIAGNOSIS — K5792 Diverticulitis of intestine, part unspecified, without perforation or abscess without bleeding: Secondary | ICD-10-CM

## 2011-01-29 DIAGNOSIS — J4489 Other specified chronic obstructive pulmonary disease: Secondary | ICD-10-CM | POA: Insufficient documentation

## 2011-01-29 DIAGNOSIS — J449 Chronic obstructive pulmonary disease, unspecified: Secondary | ICD-10-CM | POA: Insufficient documentation

## 2011-01-29 DIAGNOSIS — K5732 Diverticulitis of large intestine without perforation or abscess without bleeding: Secondary | ICD-10-CM | POA: Insufficient documentation

## 2011-01-29 DIAGNOSIS — J984 Other disorders of lung: Secondary | ICD-10-CM | POA: Insufficient documentation

## 2011-01-29 HISTORY — DX: Hypoglycemia, unspecified: E16.2

## 2011-01-29 HISTORY — DX: Chronic obstructive pulmonary disease, unspecified: J44.9

## 2011-01-29 HISTORY — DX: Supraventricular tachycardia: I47.1

## 2011-01-29 LAB — URINE MICROSCOPIC-ADD ON

## 2011-01-29 LAB — LIPASE, BLOOD: Lipase: 36 U/L (ref 11–59)

## 2011-01-29 LAB — URINALYSIS, ROUTINE W REFLEX MICROSCOPIC
Bilirubin Urine: NEGATIVE
Nitrite: NEGATIVE
Specific Gravity, Urine: 1.02 (ref 1.005–1.030)
Urobilinogen, UA: 0.2 mg/dL (ref 0.0–1.0)

## 2011-01-29 LAB — DIFFERENTIAL
Basophils Relative: 0 % (ref 0–1)
Eosinophils Absolute: 0.1 10*3/uL (ref 0.0–0.7)
Neutrophils Relative %: 86 % — ABNORMAL HIGH (ref 43–77)

## 2011-01-29 LAB — BASIC METABOLIC PANEL
GFR calc Af Amer: >60 mL/min (ref >60)
GFR calc non Af Amer: >60 mL/min (ref >60)
Potassium: 3.9 mEq/L (ref 3.5–5.1)
Sodium: 138 mEq/L (ref 135–145)

## 2011-01-29 LAB — CBC
MCH: 31 pg (ref 26.0–34.0)
MCHC: 33.7 g/dL (ref 30.0–36.0)
Platelets: 242 10*3/uL (ref 150–400)
RBC: 4 MIL/uL (ref 3.87–5.11)

## 2011-01-29 MED ORDER — HYDROMORPHONE HCL 1 MG/ML IJ SOLN
1.0000 mg | Freq: Once | INTRAMUSCULAR | Status: AC
  Administered 2011-01-29: 1 mg via INTRAVENOUS
  Filled 2011-01-29: qty 1

## 2011-01-29 MED ORDER — ONDANSETRON HCL 4 MG/2ML IJ SOLN
4.0000 mg | Freq: Once | INTRAMUSCULAR | Status: AC
  Administered 2011-01-29: 4 mg via INTRAVENOUS
  Filled 2011-01-29: qty 2

## 2011-01-29 MED ORDER — MOXIFLOXACIN HCL 400 MG PO TABS: 400.0000 mg | ORAL_TABLET | Freq: Every day | ORAL | Status: AC

## 2011-01-29 MED ORDER — HYDROMORPHONE HCL 1 MG/ML IJ SOLN: 1.0000 mg | Freq: Once | INTRAMUSCULAR | Status: DC

## 2011-01-29 MED ORDER — SODIUM CHLORIDE 0.9 % IV BOLUS (SEPSIS)
250.0000 mL | Freq: Once | INTRAVENOUS | Status: AC
  Administered 2011-01-29: 1000 mL via INTRAVENOUS

## 2011-01-29 MED ORDER — DIPHENHYDRAMINE HCL 50 MG/ML IJ SOLN
INTRAMUSCULAR | Status: AC
  Administered 2011-01-29: 25 mg
  Filled 2011-01-29: qty 1

## 2011-01-29 MED ORDER — OXYCODONE-ACETAMINOPHEN 5-325 MG PO TABS: 1.0000 | ORAL_TABLET | ORAL | Status: AC | PRN

## 2011-01-29 MED ORDER — DIPHENHYDRAMINE HCL 50 MG/ML IJ SOLN: 25.0000 mg | Freq: Once | INTRAMUSCULAR | Status: DC

## 2011-01-29 MED ORDER — MOXIFLOXACIN HCL IN NACL 400 MG/250ML IV SOLN
400.0000 mg | Freq: Once | INTRAVENOUS | Status: AC
  Administered 2011-01-29: 400 mg via INTRAVENOUS
  Filled 2011-01-29: qty 250

## 2011-01-29 MED ORDER — SODIUM CHLORIDE 0.9 % IV SOLN: INTRAVENOUS | Status: DC

## 2011-01-29 MED ORDER — ONDANSETRON 8 MG PO TBDP: 8.0000 mg | ORAL_TABLET | Freq: Three times a day (TID) | ORAL | Status: AC | PRN

## 2011-01-29 MED ORDER — HYDROMORPHONE HCL 2 MG/ML IJ SOLN
INTRAMUSCULAR | Status: AC
  Administered 2011-01-29: 1 mg via INTRAVENOUS
  Filled 2011-01-29: qty 1

## 2011-01-29 NOTE — ED Notes (Signed)
edp ok w/ pt getting ice chips at this time. Ice chips provided to pt.

## 2011-01-29 NOTE — ED Notes (Signed)
Pt states she is feeling better at this time. Itching has improved. Pt requesting something to drink. Advised we are awaiting results of ct scan.

## 2011-01-29 NOTE — ED Notes (Signed)
Pt reports severe abd pain that began last night.  Pt reports frequent bm's with a lot of pressure, and cramping but little stool.  Pt denies any n/v.

## 2011-01-29 NOTE — ED Notes (Signed)
Pt stating she felt itchy after drinking oral contrast dr Saul Fordyce verbal order for benadryl given pt to ct. No noted distress no sob, no hives.

## 2011-02-01 LAB — GLUCOSE, CAPILLARY: Glucose-Capillary: 97

## 2011-02-05 ENCOUNTER — Encounter (INDEPENDENT_AMBULATORY_CARE_PROVIDER_SITE_OTHER): Payer: Self-pay | Admitting: *Deleted

## 2011-02-25 ENCOUNTER — Other Ambulatory Visit (INDEPENDENT_AMBULATORY_CARE_PROVIDER_SITE_OTHER): Payer: Self-pay | Admitting: *Deleted

## 2011-02-25 ENCOUNTER — Encounter (INDEPENDENT_AMBULATORY_CARE_PROVIDER_SITE_OTHER): Payer: Self-pay | Admitting: *Deleted

## 2011-02-25 ENCOUNTER — Ambulatory Visit (INDEPENDENT_AMBULATORY_CARE_PROVIDER_SITE_OTHER): Payer: Medicare Other | Admitting: Internal Medicine

## 2011-02-25 ENCOUNTER — Encounter (INDEPENDENT_AMBULATORY_CARE_PROVIDER_SITE_OTHER): Payer: Self-pay | Admitting: Internal Medicine

## 2011-02-25 ENCOUNTER — Telehealth (INDEPENDENT_AMBULATORY_CARE_PROVIDER_SITE_OTHER): Payer: Self-pay | Admitting: *Deleted

## 2011-02-25 VITALS — BP 104/58 | HR 72 | Temp 98.2°F | Ht 62.0 in | Wt 88.9 lb

## 2011-02-25 DIAGNOSIS — K5732 Diverticulitis of large intestine without perforation or abscess without bleeding: Secondary | ICD-10-CM

## 2011-02-25 DIAGNOSIS — K5792 Diverticulitis of intestine, part unspecified, without perforation or abscess without bleeding: Secondary | ICD-10-CM

## 2011-02-25 MED ORDER — PEG-KCL-NACL-NASULF-NA ASC-C 100 G PO SOLR: 1.0000 | Freq: Once | ORAL | Status: DC

## 2011-02-25 MED ORDER — MOXIFLOXACIN HCL 400 MG PO TABS: 400.0000 mg | ORAL_TABLET | Freq: Every day | ORAL | Status: AC

## 2011-02-25 NOTE — Patient Instructions (Signed)
Progress report in 2 weeks. Complete Avelox. Will schedule a colonoscopy when symptoms have completely resolved.

## 2011-02-25 NOTE — Telephone Encounter (Signed)
Patient needs movi prep 

## 2011-02-25 NOTE — Progress Notes (Signed)
Subjective:     Patient ID: [name], female   DOB: 1956-02-24, 55 y.o.   MRN: [id number]  HPI [name] is a 55 yr old female referred to our office for diverticulitis.  She presented to the ED in September.  She was hurting in her upper abdomen. She thought her appendix had burst. She was in severe pain.   She was placed on Avelox 400mg  for 10 days.  She tells me for the last 53 days she has ran a fever and abdominal pain.  She rates the pain 3 or 4 out of 10. Last night she rated the pain at a 7.    She had tenderness in her upper abdomen,.  She says her appetite is good. No weight loss. She has a BM usually once a day. No melena or rectal bleeding. Since she had diverticulitis her bowel movements have been irregular. She has never undergone a colonoscopy in the past.   01/29/2011 CT. Abdomen and Pelvis w/wo CM. IMPRESSION:  1. Findings suggestive of acute diverticulitis of the sigmoid  colon. Suggest correlation with the patient's symptoms and  laboratory values. After the patient's symptoms resolve, consider  follow-up colonoscopy to exclude a mass lesion as the cause of  sigmoid colon thickening.  2. Emphysema.  3. Multiple calcified pulmonary nodules. A solitary noncalcified  3-4 mm nodule is seen in the right lower lobe. Especially given  the patient's smoking history, a follow-up noncontrast chest CT  should be considered.  Review of Systems see hpi Current Outpatient Prescriptions  Medication Sig Dispense Refill  . albuterol (PROAIR HFA) 108 (90 BASE) MCG/ACT inhaler Inhale 2 puffs into the lungs every 6 (six) hours as needed. Rescue inhaler       . albuterol (PROVENTIL HFA;VENTOLIN HFA) 108 (90 BASE) MCG/ACT inhaler Inhale 2 puffs into the lungs every 6 (six) hours as needed.        . ALPRAZolam (XANAX) 0.5 MG tablet Take 0.5 mg by mouth 3 (three) times daily as needed. anxiety       . aspirin 81 MG chewable tablet Chew 81 mg by mouth daily.       Marland Kitchen BLACK COHOSH HOT FLASH RELIEF  PO Take 1 tablet by mouth 2 (two) times daily.       . cetirizine (ZYRTEC) 10 MG tablet Take 10 mg by mouth daily.        . diazepam (VALIUM) 5 MG tablet Take 5 mg by mouth every 6 (six) hours as needed. Spasms/prescribed after surgery       . diltiazem (DILACOR XR) 180 MG 24 hr capsule Take 180 mg by mouth daily.        Marland Kitchen escitalopram (LEXAPRO) 20 MG tablet Take 20 mg by mouth daily.        . Fluticasone-Salmeterol (ADVAIR) 250-50 MCG/DOSE AEPB Inhale 1 puff into the lungs every 12 (twelve) hours.        Marland Kitchen ibuprofen (ADVIL) 200 MG tablet Take 400 mg by mouth every 6 (six) hours as needed. pain       . metaxalone (SKELAXIN) 800 MG tablet Take 800 mg by mouth as needed. spasms      . Multiple Vitamins-Minerals (MULTIVITAMINS THER. W/MINERALS) TABS Take 1 tablet by mouth daily.        . roflumilast (DALIRESP) 500 MCG TABS tablet Take 500 mcg by mouth daily.        . rosuvastatin (CRESTOR) 20 MG tablet Take 20 mg by mouth daily.        Marland Kitchen  tiotropium (SPIRIVA) 18 MCG inhalation capsule Place 18 mcg into inhaler and inhale daily.       . traMADol-acetaminophen (ULTRACET) 37.5-325 MG per tablet Take 2 tablets by mouth every 6 (six) hours as needed. pain       . triamcinolone (NASACORT) 55 MCG/ACT nasal inhaler Place 2 sprays into the nose 2 (two) times daily as needed. Nasal congestion       . moxifloxacin (AVELOX) 400 MG tablet Take 1 tablet (400 mg total) by mouth daily.  10 tablet  0   Allergies  Allergen Reactions  . Iohexol      Desc: IV DYE  ANAPHYLATIC SHOCK X2 ONCE AFTER PREMEDS   . Penicillins    Past Surgical History  Procedure Date  . Spinal fusion   . Back surgery    Past Medical History  Diagnosis Date  . COPD (chronic obstructive pulmonary disease)   . Hypoglycemia   . SVT (supraventricular tachycardia)   . Tachycardia    .soc History   Social History Narrative  . No narrative on file   Family Status  Relation Status Death Age  . Mother Deceased     Heart disease    . Father Deceased     Pneumona, lung disease  . Sister Alive     good health  . Child Alive     good health   History   Social History Narrative  . No narrative on file       Objective:   Physical Exam  Filed Vitals:   02/25/11 1017  BP: 104/58  [name]: 72  Temp: 98.2 F (36.8 C)  Height: 5\' 2"  (1.575 m)  Weight: 88 lb 14.4 oz (40.325 kg)     Alert and oriented. Skin warm and dry. Oral mucosa is moist. Natural teeth in good condition. Sclera anicteric, conjunctivae is pink. Thyroid not enlarged. No cervical lymphadenopathy. Lungs clear. Heart regular rate and rhythm.  Abdomen is soft. Bowel sounds are positive. No hepatomegaly. No abdominal masses felt. No tenderness on palpatation. .  No edema to lower extremities. Patient is alert and oriented.      Assessment:    Diverticulitis. She c/o across her abdomen. Similar pain in September when she had a bout of diverticulitis, but not as intense.   Abnormal CT.  Recommended follow up colonoscopy to exclude a mass lesion as the cause of sigmoid colon thickening.    Plan:    Avelox 400mg  once a day x 10 days. Will schedule a colonoscopy.  She will call with a progress report in 2 weeks

## 2011-02-26 NOTE — ED Provider Notes (Signed)
History     CSN: [id number] Arrival date & time: 01/29/2011  1:20 PM   None     Chief Complaint  Patient presents with  . Abdominal Pain    (Consider location/radiation/quality/duration/timing/severity/associated sxs/prior treatment) Patient is a 55 y.o. female presenting with abdominal pain. The history is provided by the patient.  Abdominal Pain The primary symptoms of the illness include abdominal pain and nausea. The primary symptoms of the illness do not include fever, shortness of breath, vomiting, diarrhea or dysuria. The current episode started yesterday (AT 1900). The onset of the illness was sudden. The problem has been gradually worsening.  The abdominal pain is located in the RUQ and RLQ. The abdominal pain radiates to the back. The severity of the abdominal pain is 8/10. The abdominal pain is relieved by nothing.  The patient states that she believes she is currently not pregnant. Additional symptoms associated with the illness include back pain. Symptoms associated with the illness do not include chills, diaphoresis, urgency, hematuria or frequency. Associated symptoms comments: SPINAL FUSION 3 WEEKS AGO. HAS IVP DYE ALLERGY. . Significant associated medical issues include cardiac disease. Significant associated medical issues do not include PUD, GERD, inflammatory bowel disease, diabetes, sickle cell disease, gallstones or diverticulitis.    Past Medical History  Diagnosis Date  . COPD (chronic obstructive pulmonary disease)   . Hypoglycemia   . SVT (supraventricular tachycardia)   . Tachycardia     Past Surgical History  Procedure Date  . Spinal fusion   . Back surgery     No family history on file.  History  Substance Use Topics  . Smoking status: Former Games developer  . Smokeless tobacco: Not on file   Comment: quit 2 months ago after smoking at age 78 pack a day  . Alcohol Use: Yes     rare    OB History    Grav Para Term Preterm Abortions TAB SAB Ect Mult  Living                  Review of Systems  Constitutional: Negative for fever, chills and diaphoresis.  HENT: Negative for neck pain.   Eyes: Negative for visual disturbance.  Respiratory: Negative for shortness of breath.   Cardiovascular: Negative for chest pain and leg swelling.  Gastrointestinal: Positive for nausea and abdominal pain. Negative for vomiting and diarrhea.  Genitourinary: Negative for dysuria, urgency, frequency and hematuria.  Musculoskeletal: Positive for back pain.  Skin: Negative for rash.  Neurological: Negative for headaches.  Hematological: Negative for adenopathy. Does not bruise/bleed easily.    Allergies  Iohexol and Penicillins  Home Medications   Current Outpatient Rx  Name Route Sig Dispense Refill  . ALBUTEROL SULFATE HFA 108 (90 BASE) MCG/ACT IN AERS Inhalation Inhale 2 puffs into the lungs every 6 (six) hours as needed.      . ASPIRIN 81 MG PO CHEW Oral Chew 81 mg by mouth daily.     Marland Kitchen BLACK COHOSH HOT FLASH RELIEF PO Oral Take 1 tablet by mouth 2 (two) times daily.     Marland Kitchen CETIRIZINE HCL 10 MG PO TABS Oral Take 10 mg by mouth daily.      Marland Kitchen DILTIAZEM HCL CR 180 MG PO CP24 Oral Take 180 mg by mouth daily.      Marland Kitchen ESCITALOPRAM OXALATE 20 MG PO TABS Oral Take 20 mg by mouth daily.      Marland Kitchen FLUTICASONE-SALMETEROL 250-50 MCG/DOSE IN AEPB Inhalation Inhale 1 puff into the lungs  every 12 (twelve) hours.      . IBUPROFEN 200 MG PO TABS Oral Take 400 mg by mouth every 6 (six) hours as needed. pain     . METAXALONE 800 MG PO TABS Oral Take 800 mg by mouth as needed. spasms    . THERA M PLUS PO TABS Oral Take 1 tablet by mouth daily.      . ROFLUMILAST 500 MCG PO TABS Oral Take 500 mcg by mouth daily.      Marland Kitchen ROSUVASTATIN CALCIUM 20 MG PO TABS Oral Take 20 mg by mouth daily.      Marland Kitchen TIOTROPIUM BROMIDE MONOHYDRATE 18 MCG IN CAPS Inhalation Place 18 mcg into inhaler and inhale daily.     . TRIAMCINOLONE ACETONIDE 55 MCG/ACT NA INHA Nasal Place 2 sprays into the  nose 2 (two) times daily as needed. Nasal congestion     . ALBUTEROL SULFATE HFA 108 (90 BASE) MCG/ACT IN AERS Inhalation Inhale 2 puffs into the lungs every 6 (six) hours as needed. Rescue inhaler     . ALPRAZOLAM 0.5 MG PO TABS Oral Take 0.5 mg by mouth 3 (three) times daily as needed. anxiety     . DIAZEPAM 5 MG PO TABS Oral Take 5 mg by mouth every 6 (six) hours as needed. Spasms/prescribed after surgery     . MOXIFLOXACIN HCL 400 MG PO TABS Oral Take 1 tablet (400 mg total) by mouth daily. 10 tablet 0  . PEG-KCL-NACL-NASULF-NA ASC-C 100 G PO SOLR Oral Take 1 kit (100 g total) by mouth once. 1 kit 0  . TRAMADOL-ACETAMINOPHEN 37.5-325 MG PO TABS Oral Take 2 tablets by mouth every 6 (six) hours as needed. pain       BP 116/75  Pulse 101  Temp(Src) 98.8 F (37.1 C) (Oral)  Resp 17  Ht 5\' 3"  (1.6 m)  Wt 95 lb (43.092 kg)  BMI 16.83 kg/m2  SpO2 94%  Physical Exam  Nursing note and vitals reviewed. Constitutional: She is oriented to person, place, and time. She appears well-developed and well-nourished. No distress.  HENT:  Head: Normocephalic and atraumatic.  Mouth/Throat: Oropharynx is clear and moist.  Eyes: Conjunctivae and EOM are normal. Pupils are equal, round, and reactive to light.  Neck: Normal range of motion. Neck supple.  Cardiovascular: Normal rate, regular rhythm, normal heart sounds and intact distal pulses.   Pulmonary/Chest: Effort normal and breath sounds normal. She has no wheezes. She exhibits no tenderness.  Abdominal: Soft. Bowel sounds are normal. She exhibits no mass. There is tenderness. There is no guarding.       RIGHT SIDED.   Musculoskeletal: Normal range of motion. She exhibits no edema and no tenderness.  Neurological: She is alert and oriented to person, place, and time. No cranial nerve deficit. She exhibits normal muscle tone. Coordination normal.  Skin: Skin is warm. No rash noted.    ED Course  Procedures (including critical care time)  Labs  Reviewed  CBC - Abnormal; Notable for the following:    WBC 19.9 (*)    All other components within normal limits  DIFFERENTIAL - Abnormal; Notable for the following:    Neutrophils Relative 86 (*)    Neutro Abs 17.2 (*)    Lymphocytes Relative 8 (*)    Monocytes Absolute 1.1 (*)    All other components within normal limits  URINALYSIS, ROUTINE W REFLEX MICROSCOPIC - Abnormal; Notable for the following:    Hgb urine dipstick SMALL (*)  Ketones, ur TRACE (*)    All other components within normal limits  URINE MICROSCOPIC-ADD ON - Abnormal; Notable for the following:    Squamous Epithelial / LPF FEW (*)    All other components within normal limits  BASIC METABOLIC PANEL  LIPASE, BLOOD  LAB REPORT - SCANNED   Results for orders placed during the hospital encounter of 01/29/11  CBC      Component Value Range   WBC 19.9 (*) 4.0 - 10.5 (K/uL)   RBC 4.00  3.87 - 5.11 (MIL/uL)   Hemoglobin 12.4  12.0 - 15.0 (g/dL)   HCT 16.1  09.6 - 04.5 (%)   MCV 92.0  78.0 - 100.0 (fL)   MCH 31.0  26.0 - 34.0 (pg)   MCHC 33.7  30.0 - 36.0 (g/dL)   RDW 40.9  81.1 - 91.4 (%)   Platelets 242  150 - 400 (K/uL)  DIFFERENTIAL      Component Value Range   Neutrophils Relative 86 (*) 43 - 77 (%)   Neutro Abs 17.2 (*) 1.7 - 7.7 (K/uL)   Lymphocytes Relative 8 (*) 12 - 46 (%)   Lymphs Abs 1.5  0.7 - 4.0 (K/uL)   Monocytes Relative 5  3 - 12 (%)   Monocytes Absolute 1.1 (*) 0.1 - 1.0 (K/uL)   Eosinophils Relative 1  0 - 5 (%)   Eosinophils Absolute 0.1  0.0 - 0.7 (K/uL)   Basophils Relative 0  0 - 1 (%)   Basophils Absolute 0.0  0.0 - 0.1 (K/uL)  BASIC METABOLIC PANEL      Component Value Range   Sodium 138  135 - 145 (mEq/L)   Potassium 3.9  3.5 - 5.1 (mEq/L)   Chloride 99  96 - 112 (mEq/L)   CO2 29  19 - 32 (mEq/L)   Glucose, Bld 98  70 - 99 (mg/dL)   BUN 9  6 - 23 (mg/dL)   Creatinine, Ser 7.82  0.50 - 1.10 (mg/dL)   Calcium 9.3  8.4 - 95.6 (mg/dL)   GFR calc non Af Amer >60  >60 (mL/min)     GFR calc Af Amer >60  >60 (mL/min)  URINALYSIS, ROUTINE W REFLEX MICROSCOPIC      Component Value Range   Color, Urine YELLOW  YELLOW    Appearance CLEAR  CLEAR    Specific Gravity, Urine 1.020  1.005 - 1.030    pH 6.0  5.0 - 8.0    Glucose, UA NEGATIVE  NEGATIVE (mg/dL)   Hgb urine dipstick SMALL (*) NEGATIVE    Bilirubin Urine NEGATIVE  NEGATIVE    Ketones, ur TRACE (*) NEGATIVE (mg/dL)   Protein, ur NEGATIVE  NEGATIVE (mg/dL)   Urobilinogen, UA 0.2  0.0 - 1.0 (mg/dL)   Nitrite NEGATIVE  NEGATIVE    Leukocytes, UA NEGATIVE  NEGATIVE   URINE MICROSCOPIC-ADD ON      Component Value Range   Squamous Epithelial / LPF FEW (*) RARE    RBC / HPF 3-6  <3 (RBC/hpf)   Bacteria, UA RARE  RARE   LIPASE, BLOOD      Component Value Range   Lipase 36  11 - 59 (U/L)   Ct Abdomen Pelvis Wo Contrast  01/29/2011  *RADIOLOGY REPORT*  Clinical Data: Abdominal pain.  Frequent bowel movements and abdominal cramps for 2 days.  COPD.  Reported history of contrast allergy.  CT ABDOMEN AND PELVIS WITHOUT CONTRAST  Technique:  Multidetector CT imaging of the abdomen  and pelvis was performed following the standard protocol without intravenous contrast.  Comparison: Chest radiograph 01/29/2011 and abdominal ultrasound 07/26/2010  Findings: The visualized lung bases demonstrate emphysema.  There are multiple peripheral tiny nodules, the majority of which are calcified.  A 3-4 mm noncalcified nodule is seen on image #1 in the right lower lobe.  There is no pleural effusion.  The kidneys are normal in size and contour.  There is no renal calculus.  Negative for hydronephrosis.  The noncontrast appearance of the liver, gallbladder, spleen, adrenal glands, and pancreas is within normal limits.  Common bile duct appears normal.  Oral contrast was given to for examination, and the bowel is opacified, to the level of the hepatic flexure.  There is evidence of wall thickening of the sigmoid colon, in a region of numerous  diverticula.  The fat planes in the adjacent sigmoid mesentery demonstrate diffuse haziness.  Findings raise possibility of acute sigmoid colon diverticulitis.  No definite significant free fluid is seen.  No focal collection to suggest abscess.  Negative for free air.  There is no evidence of bowel obstruction.  The appendix is visualized and is normal.  Small bowel loops are normal in caliber.  The urinary bladder is not very distended at the time imaging grossly unremarkable.  The uterus is present and unremarkable by CT.  No adnexal mass is appreciated.  No lymphadenopathy. Abdominal aorta is normal in caliber contains atherosclerotic calcification.  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  No acute bony abnormality or significant degenerative change of the lumbar spine.  IMPRESSION: 1.  Findings suggestive of acute diverticulitis of the sigmoid colon.  Suggest correlation with the patient's symptoms and laboratory values. After the patient's symptoms resolve, consider follow-up colonoscopy to exclude a mass lesion as the cause of sigmoid colon thickening. 2.  Emphysema.  3.  Multiple calcified pulmonary nodules.  A solitary noncalcified 3-4 mm nodule is seen in the right lower lobe.  Especially given the patient's smoking history, a follow-up noncontrast chest CT should be considered.  Original Report Authenticated By: Britta Mccreedy, M.D.   Dg Abd Acute W/chest  01/29/2011  *RADIOLOGY REPORT*  Clinical Data: Abdominal pain  ACUTE ABDOMEN SERIES (ABDOMEN 2 VIEW & CHEST 1 VIEW)  Comparison: Chest radiographs dated 12/17/2010 and 07/28/2008  Findings: Stable emphysematous changes with calcified granulomata bilaterally.  Patchy opacity / scarring in the right lower lung, stable from recent prior but new from 2010. No pleural effusion or pneumothorax.  Cardiomediastinal silhouette is within normal limits.  Nonobstructive bowel gas pattern.  No evidence of free air under the diaphragm on the upright view.   Mild curvature of the thoracolumbar spine. Prior cervical spine fixation.  IMPRESSION: No evidence of small bowel obstruction or free air.  No evidence of acute cardiopulmonary disease. Emphysematous changes with calcified granulomata bilaterally.  Patchy opacity / scarring in the right lower lung, stable from recent prior but new from 2010.  CT chest is suggested for further characterization.  Original Report Authenticated By: Charline Bills, M.D.      1. Diverticulitis       MDM  HAS FOLLOW UP WITH DR Phillips Odor. CT CW DIVERTICULITIS. STABLE FOR OUT PATIENT TX. RX WIT ANTIBIOTICS IN THE ED.         Shelda Jakes, MD 02/26/11 1728

## 2011-03-07 ENCOUNTER — Telehealth (INDEPENDENT_AMBULATORY_CARE_PROVIDER_SITE_OTHER): Payer: Self-pay | Admitting: Internal Medicine

## 2011-03-07 DIAGNOSIS — R1012 Left upper quadrant pain: Secondary | ICD-10-CM

## 2011-03-07 NOTE — Telephone Encounter (Signed)
Pain rt upper quadrant. Will get a CBC and a Cmet on her . She is scheduled for a colonoscopy this month. She has a recent hx of diverticulitis and as taken two rounds of Avelox.

## 2011-03-08 ENCOUNTER — Telehealth (INDEPENDENT_AMBULATORY_CARE_PROVIDER_SITE_OTHER): Payer: Self-pay | Admitting: Internal Medicine

## 2011-03-08 DIAGNOSIS — R1012 Left upper quadrant pain: Secondary | ICD-10-CM

## 2011-03-08 LAB — COMPREHENSIVE METABOLIC PANEL
ALT: 35 U/L (ref 0–35)
CO2: 30 mEq/L (ref 19–32)
Chloride: 99 mEq/L (ref 96–112)
Sodium: 137 mEq/L (ref 135–145)
Total Bilirubin: 0.3 mg/dL (ref 0.3–1.2)
Total Protein: 6.9 g/dL (ref 6.0–8.3)

## 2011-03-08 LAB — CBC WITH DIFFERENTIAL/PLATELET
Eosinophils Absolute: 0.2 10*3/uL (ref 0.0–0.7)
Lymphocytes Relative: 30 % (ref 12–46)
Lymphs Abs: 2.6 10*3/uL (ref 0.7–4.0)
Neutrophils Relative %: 58 % (ref 43–77)
Platelets: 271 10*3/uL (ref 150–400)
RBC: 4.1 MIL/uL (ref 3.87–5.11)
WBC: 8.5 10*3/uL (ref 4.0–10.5)

## 2011-03-08 NOTE — Telephone Encounter (Signed)
Will get a cmet

## 2011-03-19 ENCOUNTER — Encounter (HOSPITAL_COMMUNITY): Payer: Self-pay | Admitting: Pharmacy Technician

## 2011-03-21 MED ORDER — SODIUM CHLORIDE 0.45 % IV SOLN
Freq: Once | INTRAVENOUS | Status: AC
  Administered 2011-03-22: 08:00:00 via INTRAVENOUS

## 2011-03-22 ENCOUNTER — Other Ambulatory Visit (INDEPENDENT_AMBULATORY_CARE_PROVIDER_SITE_OTHER): Payer: Self-pay | Admitting: Internal Medicine

## 2011-03-22 ENCOUNTER — Encounter (HOSPITAL_COMMUNITY): Payer: Self-pay

## 2011-03-22 ENCOUNTER — Encounter (HOSPITAL_COMMUNITY)
Admission: RE | Disposition: A | Discharge status: Home / Self Care | Payer: Self-pay | Source: Ambulatory Visit | Attending: Internal Medicine

## 2011-03-22 ENCOUNTER — Ambulatory Visit (HOSPITAL_COMMUNITY)
Admission: RE | Admit: 2011-03-22 | Discharge: 2011-03-22 | Disposition: A | Discharge status: Home / Self Care | Payer: Medicare Other | Source: Ambulatory Visit | Attending: Internal Medicine | Admitting: Internal Medicine

## 2011-03-22 DIAGNOSIS — D126 Benign neoplasm of colon, unspecified: Secondary | ICD-10-CM

## 2011-03-22 DIAGNOSIS — D129 Benign neoplasm of anus and anal canal: Secondary | ICD-10-CM

## 2011-03-22 DIAGNOSIS — D128 Benign neoplasm of rectum: Secondary | ICD-10-CM | POA: Insufficient documentation

## 2011-03-22 DIAGNOSIS — Z7982 Long term (current) use of aspirin: Secondary | ICD-10-CM | POA: Insufficient documentation

## 2011-03-22 DIAGNOSIS — Z1211 Encounter for screening for malignant neoplasm of colon: Secondary | ICD-10-CM

## 2011-03-22 DIAGNOSIS — K5730 Diverticulosis of large intestine without perforation or abscess without bleeding: Secondary | ICD-10-CM

## 2011-03-22 DIAGNOSIS — K644 Residual hemorrhoidal skin tags: Secondary | ICD-10-CM

## 2011-03-22 DIAGNOSIS — J4489 Other specified chronic obstructive pulmonary disease: Secondary | ICD-10-CM | POA: Insufficient documentation

## 2011-03-22 DIAGNOSIS — K5792 Diverticulitis of intestine, part unspecified, without perforation or abscess without bleeding: Secondary | ICD-10-CM

## 2011-03-22 DIAGNOSIS — Z8719 Personal history of other diseases of the digestive system: Secondary | ICD-10-CM

## 2011-03-22 DIAGNOSIS — R1011 Right upper quadrant pain: Secondary | ICD-10-CM

## 2011-03-22 DIAGNOSIS — J449 Chronic obstructive pulmonary disease, unspecified: Secondary | ICD-10-CM | POA: Insufficient documentation

## 2011-03-22 HISTORY — PX: COLONOSCOPY: SHX5424

## 2011-03-22 SURGERY — COLONOSCOPY
Anesthesia: Moderate Sedation

## 2011-03-22 MED ORDER — STERILE WATER FOR IRRIGATION IR SOLN
Status: DC | PRN
  Administered 2011-03-22: 09:00:00

## 2011-03-22 MED ORDER — MEPERIDINE HCL 50 MG/ML IJ SOLN
INTRAMUSCULAR | Status: AC
  Filled 2011-03-22: qty 1

## 2011-03-22 MED ORDER — MIDAZOLAM HCL 5 MG/5ML IJ SOLN
INTRAMUSCULAR | Status: DC | PRN
  Administered 2011-03-22 (×5): 2 mg via INTRAVENOUS

## 2011-03-22 MED ORDER — HYOSCYAMINE SULFATE 0.125 MG SL SUBL: 0.1250 mg | SUBLINGUAL_TABLET | Freq: Three times a day (TID) | SUBLINGUAL | Status: AC | PRN

## 2011-03-22 MED ORDER — MIDAZOLAM HCL 5 MG/5ML IJ SOLN
INTRAMUSCULAR | Status: AC
  Filled 2011-03-22: qty 10

## 2011-03-22 MED ORDER — MEPERIDINE HCL 50 MG/ML IJ SOLN
INTRAMUSCULAR | Status: DC | PRN
  Administered 2011-03-22 (×2): 20 mg via INTRAVENOUS

## 2011-03-22 NOTE — Op Note (Signed)
COLONOSCOPY PROCEDURE REPORT  PATIENT:  [name]  MR#:  [id number] Birthdate:  12/19/1955, 55 y.o., female Endoscopist:  Dr. Malissa Hippo, MD Referred By:  Dr. [name], MD. Procedure Date: 03/22/2011  Procedure:   Colonoscopy with snare polypectomy.  Indications: Patient is 55 year old Caucasian female who was treated for diverticulitis about 7 weeks ago. She is undergoing colonoscopy both for diagnostic and screening purposes.  Informed Consent:  Procedure and risks were reviewed with the patient and informed consent was obtained.  Medications:  Demerol 40 mg IV Versed 10 mg IV  Description of procedure:  After a digital rectal exam was performed, that colonoscope was advanced from the anus through the rectum and colon to the area of the cecum, ileocecal valve and appendiceal orifice. The cecum was deeply intubated. These structures were well-seen and photographed for the record. From the level of the cecum and ileocecal valve, the scope was slowly and cautiously withdrawn. The mucosal surfaces were carefully surveyed utilizing scope tip to flexion to facilitate fold flattening as needed. The scope was pulled down into the rectum where a thorough exam including retroflexion was performed.  Findings:   Prep fair to satisfactory. She had a lot of thick liquid stool and some segments. 8 mm broad-based polyp snared from distal transverse colon. This polyp wasn't retrieved using the Roth net. 3 small polyps at rectum one was coagulated and 2 were snared. One of these was lost. Moderate number of diverticula involving sigmoid colon. Hemorrhoids below the dentate line and sentinel skin tags  Therapeutic/Diagnostic Maneuvers Performed:  See above.  Complications:  None  Cecal Withdrawal Time:  28 minutes  Impression:  Examination performed to cecum. Moderate sigmoid diverticulosis. 8 mm polyp snared from distal transverse colon. Three small polyps at rectum two of  which were snared and one was coagulated. External hemorrhoids and sentinel skin tags.  Recommendations:  Standard instructions given. Patient advised to use ibuprofen sparingly as it can cause diverticulitis. I will be contacting patient with results of biopsy.  Eleaner Dibartolo U  03/22/2011 9:28 AM  CC: Dr. Cassell Smiles., MD & Dr. Bonnetta Barry ref. provider found

## 2011-03-22 NOTE — H&P (Signed)
This is an update to history and physical from 02/25/2011. She was diagnosed with diverticulitis about 7 weeks ago and symptoms of resolved with antibiotic therapy she is undergoing colonoscopy to make sure she does not have neoplasm or colitis.

## 2011-03-26 ENCOUNTER — Encounter (INDEPENDENT_AMBULATORY_CARE_PROVIDER_SITE_OTHER): Payer: Self-pay | Admitting: *Deleted

## 2011-04-01 ENCOUNTER — Encounter (HOSPITAL_COMMUNITY): Payer: Self-pay | Admitting: Internal Medicine

## 2011-07-11 DIAGNOSIS — J449 Chronic obstructive pulmonary disease, unspecified: Secondary | ICD-10-CM | POA: Diagnosis not present

## 2011-07-11 DIAGNOSIS — Z87891 Personal history of nicotine dependence: Secondary | ICD-10-CM | POA: Diagnosis not present

## 2011-07-11 DIAGNOSIS — J9600 Acute respiratory failure, unspecified whether with hypoxia or hypercapnia: Secondary | ICD-10-CM | POA: Diagnosis not present

## 2011-07-11 DIAGNOSIS — B370 Candidal stomatitis: Secondary | ICD-10-CM | POA: Diagnosis not present

## 2011-08-22 DIAGNOSIS — J449 Chronic obstructive pulmonary disease, unspecified: Secondary | ICD-10-CM | POA: Diagnosis not present

## 2011-08-22 DIAGNOSIS — E782 Mixed hyperlipidemia: Secondary | ICD-10-CM | POA: Diagnosis not present

## 2011-09-09 ENCOUNTER — Ambulatory Visit (HOSPITAL_COMMUNITY)
Admission: RE | Admit: 2011-09-09 | Discharge: 2011-09-09 | Disposition: A | Discharge status: Home / Self Care | Payer: Medicare Other | Source: Ambulatory Visit | Attending: Neurosurgery | Admitting: Neurosurgery

## 2011-09-09 DIAGNOSIS — M545 Low back pain, unspecified: Secondary | ICD-10-CM | POA: Insufficient documentation

## 2011-09-09 DIAGNOSIS — M6281 Muscle weakness (generalized): Secondary | ICD-10-CM | POA: Insufficient documentation

## 2011-09-09 DIAGNOSIS — IMO0001 Reserved for inherently not codable concepts without codable children: Secondary | ICD-10-CM | POA: Diagnosis not present

## 2011-09-09 NOTE — Evaluation (Signed)
Physical Therapy Evaluation  Patient Details  Name: [name] MRN: [id number] Date of Birth: 05-Mar-1956  Today's Date: 09/09/2011 Time: 4098-1191 PT Time Calculation (min): 38 min  Visit#: 1  of 8   Re-eval: 10/09/11 Assessment Diagnosis: back pain Next MD Visit: 10/09/2011 Prior Therapy: none   Authorization Visit#: 1  of 10  (will need G-codes)   Past Medical History:  Past Medical History  Diagnosis Date  . COPD (chronic obstructive pulmonary disease)   . Hypoglycemia   . SVT (supraventricular tachycardia)   . Tachycardia   . Complication of anesthesia     o2 sats dropped after back surgery  . Shortness of breath    Past Surgical History:  Past Surgical History  Procedure Date  . Spinal fusion   . Back surgery   . Colonoscopy 03/22/2011    Procedure: COLONOSCOPY;  Surgeon: Malissa Hippo, MD;  [facility]: [facility];  Service: Endoscopy;  Laterality: N/A;    Subjective Symptoms/Limitations Symptoms: [name] states that she has had three cervical spinal fusions.  The last fusion was less than a year ago.  She states that her neck pain has improved since the surgery but now she is now having low back pain.    She states that the pain is equal with no radicular pain. How long can you sit comfortably?: The patient states that she is only able to sit for ten minutes before she wants to shift her weight. How long can you stand comfortably?: The patient is able to stand unless she is holding or carrying an object. How long can you walk comfortably?: She is on O2 due to COPD therefore limitation in walking is due to back and breathing.  She states she is only able to go to the grocery store for 30 minutes and then she has to stop. Special Tests: The patient is waking up around three to four in the morning and then has difficulty falling back to sleep. Pain Assessment Currently in Pain?: Yes Pain Score:   5 (Highest has been a 7-8 that is in the mornings.) Pain  Location: Back Pain Orientation: Right;Left;Lower Pain Type: Chronic pain Pain Onset: More than a month ago Pain Frequency: Constant Pain Relieving Factors: Massaging with tennis balls. Effect of Pain on Daily Activities: increases    Prior Functioning  Prior Function Vocation: On disability  Cognition/Observation Cognition Overall Cognitive Status: Appears within functional limits for tasks assessed  Sensation/Coordination/Flexibility/Functional Tests Functional Tests Functional Tests: Oswestry 25/50  Assessment RLE Strength Right Hip Flexion: 5/5 Right Hip Extension: 3+/5 Right Hip ABduction: 5/5 Right Hip ADduction: 5/5 Right Knee Flexion: 3+/5 Right Knee Extension: 5/5 Right Ankle Dorsiflexion: 5/5 LLE Strength Left Hip Flexion: 5/5 Left Hip Extension: 3+/5 Left Hip ABduction: 5/5 Left Hip ADduction: 5/5 Left Knee Flexion: 3+/5 Left Knee Extension: 5/5 Left Ankle Dorsiflexion: 5/5 Lumbar AROM Lumbar Flexion: decreased 30% motion with hip flexion Lumbar Extension: decreased 30% Lumbar - Right Side Bend: decreased 30% Lumbar - Left Side Bend: decreased 30% Lumbar - Right Rotation: decreased 50% Lumbar - Left Rotation: decreased 505 Palpation Palpation: tight paraspinal mm  Exercise/Treatments      Seated Other Seated Lumbar Exercises: pelvic floor 10x 10 Other Seated Lumbar Exercises: transverse ab 10x 10   Physical Therapy Assessment and Plan PT Assessment and Plan Clinical Impression Statement: Pt with decreased core strength causing increased low back pain who will benefit from skilled Pt to educate on proper core strengthening to improve pt quality  of life. Pt will benefit from skilled therapeutic intervention in order to improve on the following deficits: Decreased activity tolerance;Cardiopulmonary status limiting activity;Pain;Increased muscle spasms;Increased fascial restricitons;Impaired perceived functional ability Rehab Potential: Good PT  Frequency: Min 2X/week PT Duration: 4 weeks PT Treatment/Interventions: Therapeutic exercise PT Plan: begin Knee to chest, active ham stretch, clam, bent knee lift and prone anterior pelvic tilt for exercises next treatment followed by massage to low back    Goals Home Exercise Program Pt will Perform Home Exercise Program: Independently PT Short Term Goals Time to Complete Short Term Goals: 2 weeks PT Short Term Goal 1: Pain no greater than a 5/10 PT Short Term Goal 2: Pt able to sleep throughout the night PT Long Term Goals Time to Complete Long Term Goals: 4 weeks PT Long Term Goal 1: I in advance HEP PT Long Term Goal 2: Pt pain to be no greater than a 2 75% of the day Long Term Goal 3: Pt to be able to sit for an hour without increase pain Long Term Goal 4: Pt to be able to walk for 30 minutes and not have any increase back pain, (pt will not be able to walk more secondary to COPD)  Problem List Patient Active Problem List  Diagnoses  . Diverticulitis    PT - End of Session Activity Tolerance: Patient tolerated treatment well General Behavior During Session: [facility] for tasks performed Cognition: [facility] for tasks performed PT Plan of Care PT Patient Instructions: given Consulted and Agree with Plan of Care: Patient  GP  Functional Reporting Modifier  Current Status  [phone] - Mobility: Walking & Moving Around CK - At least 40% but less than 60% impaired, limited or restricted  Goal Status  [id number] - Mobility: [place] & Moving Around CI - At least 1% but less than 20% impaired, limited or restricted  I chose CK due to pain level and oswestry;  CI due to pt COPD she will not be able to return to no impairment. [name],[name] 09/09/2011, 3:18 PM  Physician Documentation Your signature is required to indicate approval of the treatment plan as stated above.  Please sign and either send electronically or make a copy of this report for your files and return this physician signed original.    Please mark one 1.__approve of plan  2. ___approve of plan with the following conditions.   ______________________________                                                          _____________________ Physician Signature                                                                                                             Date

## 2011-09-11 ENCOUNTER — Ambulatory Visit (HOSPITAL_COMMUNITY): Payer: Medicare Other | Admitting: Physical Therapy

## 2011-09-11 DIAGNOSIS — Z79899 Other long term (current) drug therapy: Secondary | ICD-10-CM | POA: Diagnosis not present

## 2011-09-11 DIAGNOSIS — Z681 Body mass index (BMI) 19 or less, adult: Secondary | ICD-10-CM | POA: Diagnosis not present

## 2011-09-11 DIAGNOSIS — S01502A Unspecified open wound of oral cavity, initial encounter: Secondary | ICD-10-CM | POA: Diagnosis not present

## 2011-09-11 DIAGNOSIS — R5383 Other fatigue: Secondary | ICD-10-CM | POA: Diagnosis not present

## 2011-09-11 DIAGNOSIS — R5381 Other malaise: Secondary | ICD-10-CM | POA: Diagnosis not present

## 2011-09-11 DIAGNOSIS — B029 Zoster without complications: Secondary | ICD-10-CM | POA: Diagnosis not present

## 2011-09-12 ENCOUNTER — Ambulatory Visit (HOSPITAL_COMMUNITY)
Admission: RE | Admit: 2011-09-12 | Discharge: 2011-09-12 | Disposition: A | Discharge status: Home / Self Care | Payer: Medicare Other | Source: Ambulatory Visit | Attending: Internal Medicine | Admitting: Internal Medicine

## 2011-09-12 DIAGNOSIS — M6281 Muscle weakness (generalized): Secondary | ICD-10-CM | POA: Diagnosis not present

## 2011-09-12 DIAGNOSIS — IMO0001 Reserved for inherently not codable concepts without codable children: Secondary | ICD-10-CM | POA: Diagnosis not present

## 2011-09-12 DIAGNOSIS — M545 Low back pain: Secondary | ICD-10-CM | POA: Diagnosis not present

## 2011-09-12 NOTE — Progress Notes (Signed)
Physical Therapy Treatment Patient Details  Name: [name] MRN: [id number] Date of Birth: 09-26-1955  Today's Date: 09/12/2011 Time: 1305-1350 PT Time Calculation (min): 45 min Visit#: 2  of 8   Re-eval: 10/09/11 Charges:  therex  32', manual 10'    Subjective: Symptoms/Limitations Symptoms: Pt. states she is sore today in her abdominal area.  Reports her back is hurting today and usually wakes her up every morning.  Currently 6/10 from central LB to coccyx area. Pain Assessment Currently in Pain?: Yes Pain Score:   6 Pain Location: Back Pain Orientation: Mid;Lower   Exercise/Treatments Stretches Active Hamstring Stretch: 1 rep;60 seconds Single Knee to Chest Stretch: 1 rep;60 seconds Supine Ab Set: 10 reps;Limitations AB Set Limitations: TrA 10"hold Clam: 5 reps Bent Knee Raise: 10 reps Straight Leg Raise: 5 reps Other Supine Lumbar Exercises: kegals 10X10"   Manual Therapy Manual Therapy: Massage Massage: prone massage to central lumbar/SI region   Physical Therapy Assessment and Plan PT Assessment and Plan Clinical Impression Statement: Instructed with new exercises with overall good form/control.  Pt. with noted fatigue of glute med mm. bilaterally.    Pt. with decreased pain at end of session. PT Plan: Continue to progress per POC.  Add prone anterior pelvic tilt next visit.     Problem List Patient Active Problem List  Diagnoses  . Diverticulitis    PT - End of Session Activity Tolerance: Patient tolerated treatment well General Behavior During Session: [facility] for tasks performed Cognition: [facility] for tasks performed   Tyaira Heward B. Bascom Levels, PTA 09/12/2011, 3:46 PM

## 2011-09-16 ENCOUNTER — Ambulatory Visit (HOSPITAL_COMMUNITY)
Admission: RE | Admit: 2011-09-16 | Discharge: 2011-09-16 | Disposition: A | Discharge status: Home / Self Care | Payer: Medicare Other | Source: Ambulatory Visit | Attending: Neurosurgery | Admitting: Neurosurgery

## 2011-09-16 DIAGNOSIS — M545 Low back pain: Secondary | ICD-10-CM | POA: Diagnosis not present

## 2011-09-16 DIAGNOSIS — IMO0001 Reserved for inherently not codable concepts without codable children: Secondary | ICD-10-CM | POA: Diagnosis not present

## 2011-09-16 DIAGNOSIS — M6281 Muscle weakness (generalized): Secondary | ICD-10-CM | POA: Diagnosis not present

## 2011-09-16 NOTE — Progress Notes (Signed)
Physical Therapy Treatment Patient Details  Name: [name] MRN: [id number] Date of Birth: Apr 16, 1956  Today's Date: 09/16/2011 Time: 4098-1191 PT Time Calculation (min): 46 min Visit#: 3  of 8   Re-eval: 10/09/11 Charges: Manual x 10' Therex 32'   Subjective: Symptoms/Limitations Symptoms: The massage helped last tx. I'm hurting today. Pain Assessment Currently in Pain?: Yes Pain Score:   5 Pain Location: Back Pain Orientation: Mid;Lower   Exercise/Treatments Stretches Active Hamstring Stretch: 2 reps;60 seconds Single Knee to Chest Stretch: 2 reps;60 seconds Supine Ab Set: 10 reps;Limitations AB Set Limitations: TrA 10"hold Bent Knee Raise: 10 reps Straight Leg Raise: 5 reps Other Supine Lumbar Exercises: kegals 10X10"  Manual Therapy Manual Therapy: Massage Massage: prone massage to central lumbar/SI region   Physical Therapy Assessment and Plan PT Assessment and Plan Clinical Impression Statement: Pt completes all therex well but does require vc's for breathing. Pt presents with tightness and spasms throughout lumbar/sacral mm. Pt reports pain decrease to 3/10 at end of session. PT Plan: Continue to progress per PT POC. Begin prone anterior pelvic tild and progress clams to SL next session.     Problem List Patient Active Problem List  Diagnoses  . Diverticulitis    PT - End of Session Activity Tolerance: Patient tolerated treatment well General Behavior During Session: [facility] for tasks performed Cognition: [facility] for tasks performed   Seth Bake, PTA 09/16/2011, 3:31 PM

## 2011-09-18 ENCOUNTER — Ambulatory Visit (HOSPITAL_COMMUNITY): Payer: Medicare Other | Admitting: *Deleted

## 2011-09-19 ENCOUNTER — Ambulatory Visit (HOSPITAL_COMMUNITY): Payer: Medicare Other | Admitting: Physical Therapy

## 2011-09-20 ENCOUNTER — Ambulatory Visit (HOSPITAL_COMMUNITY)
Admission: RE | Admit: 2011-09-20 | Discharge: 2011-09-20 | Disposition: A | Discharge status: Home / Self Care | Payer: Medicare Other | Source: Ambulatory Visit

## 2011-09-20 DIAGNOSIS — M545 Low back pain: Secondary | ICD-10-CM | POA: Diagnosis not present

## 2011-09-20 DIAGNOSIS — IMO0001 Reserved for inherently not codable concepts without codable children: Secondary | ICD-10-CM | POA: Diagnosis not present

## 2011-09-20 DIAGNOSIS — M6281 Muscle weakness (generalized): Secondary | ICD-10-CM | POA: Diagnosis not present

## 2011-09-20 NOTE — Progress Notes (Signed)
Physical Therapy Treatment Patient Details  Name: [name] MRN: [id number] Date of Birth: 1955-09-29  Today's Date: 09/20/2011 Time: 5284-1324 PT Time Calculation (min): 40 min  Visit#: 4  of 8   Re-eval: 10/09/11  Charge: therex 30  Manual 10 min  Authorization: Medicare  Authorization Time Period:    Authorization Visit#: 4  of 10    Subjective: Symptoms/Limitations Symptoms: It's been a crazy day, LBP 4/10. Pain Assessment Currently in Pain?: Yes Pain Score:   4 Pain Location: Back Pain Orientation: Lower  Objective:  Exercise/Treatments Stretches Active Hamstring Stretch: 2 reps;60 seconds Single Knee to Chest Stretch: 60 seconds;1 rep Double Knee to Chest Stretch: 1 rep;60 seconds Supine Ab Set: 10 reps;Limitations AB Set Limitations: TrA 10"hold Bent Knee Raise: 10 reps Bridge: 10 reps Other Supine Lumbar Exercises: kegals 10X10" Sidelying Clam: Limitations Clam Limitations: NMR for glut med  with tactile cueing for correct mm 8x 5" Prone  Other Prone Lumbar Exercises: NMR for multifidus (bring tail bone towards back of head) tactile cueing for correct mm 10x 10"  Manual Therapy Manual Therapy: Massage Massage: prone massage to central lumbar/sacral region x 10  Physical Therapy Assessment and Plan PT Assessment and Plan Clinical Impression Statement: NMR complete for multifidus and glut med for strengthening and stability requiring vc-ing for correct technique and tactile cueing for correct mm.  No spasms palpated wtih STM to lumbar/sacral  region just tight mm, pt stated pain reduced to 1/10 at end of session. PT Plan: Continue progressing PT POC.    Goals    Problem List Patient Active Problem List  Diagnoses  . Diverticulitis    PT - End of Session Activity Tolerance: Patient tolerated treatment well General Behavior During Session: [facility] for tasks performed Cognition: [facility] for tasks performed  GP No functional reporting  required  Juel Burrow, PTA 09/20/2011, 2:26 PM

## 2011-09-23 ENCOUNTER — Ambulatory Visit (HOSPITAL_COMMUNITY)
Admission: RE | Admit: 2011-09-23 | Discharge: 2011-09-23 | Disposition: A | Discharge status: Home / Self Care | Payer: Medicare Other | Source: Ambulatory Visit | Attending: Internal Medicine | Admitting: Internal Medicine

## 2011-09-23 DIAGNOSIS — M6281 Muscle weakness (generalized): Secondary | ICD-10-CM | POA: Diagnosis not present

## 2011-09-23 DIAGNOSIS — M545 Low back pain: Secondary | ICD-10-CM | POA: Diagnosis not present

## 2011-09-23 DIAGNOSIS — IMO0001 Reserved for inherently not codable concepts without codable children: Secondary | ICD-10-CM | POA: Diagnosis not present

## 2011-09-23 NOTE — Evaluation (Signed)
Physical Therapy Evaluation  Patient Details  Name: [name] MRN: [id number] Date of Birth: 1955-05-31  Today's Date: 09/23/2011 Time: 4098-1191 PT Time Calculation (min): 45 min  Visit#: 5  of 8   Re-eval: 10/09/11   Authorization: Medicare    Past Medical History:  Past Medical History  Diagnosis Date  . COPD (chronic obstructive pulmonary disease)   . Hypoglycemia   . SVT (supraventricular tachycardia)   . Tachycardia   . Complication of anesthesia     o2 sats dropped after back surgery  . Shortness of breath    Past Surgical History:  Past Surgical History  Procedure Date  . Spinal fusion   . Back surgery   . Colonoscopy 03/22/2011    Procedure: COLONOSCOPY;  Surgeon: Malissa Hippo, MD;  Location: [facility];  Service: Endoscopy;  Laterality: N/A;    Subjective Symptoms/Limitations Symptoms: Pt states that she has only been doing pelvic floor and TA ex at home. Pain Assessment Currently in Pain?: Yes Pain Score:   5 Pain Location: Back Pain Type: Chronic pain      Exercise/Treatments      Stretches Active Hamstring Stretch: 2 reps;60 seconds Single Knee to Chest Stretch: 1 rep;10 seconds;30 seconds Lower Trunk Rotation: 5 reps Aerobic   Machines for Strengthening   Standing   Seated   Supine Clam: 5 reps Bent Knee Raise: 10 reps Bridge: 5 reps Sidelying Clam: Limitations Clam Limitations: NMR for glut med  with tactile cueing for correct mm 8x 5" Prone  Straight Leg Raise: 5 reps Other Prone Lumbar Exercises: NMR for multifidus (bring tail bone towards back of head) tactile cueing for correct mm 10x 10" Other Prone Lumbar Exercises: glut set x 10   Manual Therapy Manual Therapy: Massage Massage: Prone massage to lumbosacral area with Grade II mobs for L3-4; L4-5  Pt pain down to a 2 at end of sesssion. Physical Therapy Assessment and Plan PT Assessment and Plan Clinical Impression Statement: Pt observed putting O2 tank on  floor incorrectly,( using back and not legs); lying down incorrectly,(reverse sit-up), Pt educated on correct way to complete the above tasks.  Pt allowing back to move duing stab ex worked on correct technique.  Added prone hip ext and heel squeezes for glut strengthening. Rehab Potential: Good PT Frequency: Min 2X/week PT Duration: 4 weeks PT Plan: begin opposite arm leg lift next treatment.  Review proper body mechanics.    Goals    Problem List Patient Active Problem List  Diagnoses  . Diverticulitis    PT - End of Session Activity Tolerance: Patient tolerated treatment well General Behavior During Session: [facility] for tasks performed Cognition: [facility] for tasks performed    [name],[name] 09/23/2011, 12:03 PM  Physician Documentation Your signature is required to indicate approval of the treatment plan as stated above.  Please sign and either send electronically or make a copy of this report for your files and return this physician signed original.   Please mark one 1.__approve of plan  2. ___approve of plan with the following conditions.   ______________________________                                                          _____________________ Physician Signature  Date

## 2011-09-25 ENCOUNTER — Ambulatory Visit (HOSPITAL_COMMUNITY)
Admission: RE | Admit: 2011-09-25 | Discharge: 2011-09-25 | Disposition: A | Discharge status: Home / Self Care | Payer: Medicare Other | Source: Ambulatory Visit | Attending: Internal Medicine | Admitting: Internal Medicine

## 2011-09-25 DIAGNOSIS — M545 Low back pain: Secondary | ICD-10-CM | POA: Diagnosis not present

## 2011-09-25 DIAGNOSIS — M6281 Muscle weakness (generalized): Secondary | ICD-10-CM | POA: Diagnosis not present

## 2011-09-25 DIAGNOSIS — IMO0001 Reserved for inherently not codable concepts without codable children: Secondary | ICD-10-CM | POA: Diagnosis not present

## 2011-09-25 NOTE — Progress Notes (Signed)
Physical Therapy Treatment Patient Details  Name: NAYDA RIESEN MRN: [id number] Date of Birth: Oct 03, 1955  Today's Date: 09/25/2011 Time: 5284-1324 PT Time Calculation (min): 47 min  Visit#: 6  of 8   Re-eval: 10/03/11   Subjective:  Doing ex at home     Exercise/Treatments  Stretches Active Hamstring Stretch: 2 reps;30 seconds Single Knee to Chest Stretch: 3 reps;20 seconds Lower Trunk Rotation:  (10) Quadruped Mid Back Stretch: 5 reps    Supine Clam: 5 reps Heel Slides: 5 reps Bent Knee Raise: 10 reps Sidelying Clam: Limitations Clam Limitations: NMR for glut med  with tactile cueing for correct mm 8x 5" Hip Abduction: 10 reps Prone  Straight Leg Raise: 10 reps Opposite Arm/Leg Raise: 10 reps Other Prone Lumbar Exercises: NMR for multifidus (bring tail bone towards back of head) tactile cueing for correct mm 10x 10" Other Prone Lumbar Exercises: glut set x 10 Quadruped    Manual Therapy Manual Therapy: Massage Massage: Lumbosacral with grade II mobs for L3-4; L4-5  Physical Therapy Assessment and Plan PT Assessment and Plan Clinical Impression Statement: added heelslide, opposite A/L for  stability; all four mad cat/old horse for mobility.  Pt has difficulty isolating mm ie uses neck mm with clam PT Plan: continue to work on isolating mm with pt.  Body mechanics reviewed     Goals    Problem List Patient Active Problem List  Diagnoses  . Diverticulitis    PT - End of Session Activity Tolerance: Patient tolerated treatment well General Behavior During Session: [facility] for tasks performed Cognition: [facility] for tasks performed  GP No functional reporting required  Shekela Goodridge,[name] 09/25/2011, 11:59 AM

## 2011-10-01 ENCOUNTER — Ambulatory Visit (HOSPITAL_COMMUNITY)
Admission: RE | Admit: 2011-10-01 | Discharge: 2011-10-01 | Disposition: A | Discharge status: Home / Self Care | Payer: Medicare Other | Source: Ambulatory Visit | Attending: Internal Medicine | Admitting: Internal Medicine

## 2011-10-01 DIAGNOSIS — M545 Low back pain: Secondary | ICD-10-CM | POA: Diagnosis not present

## 2011-10-01 DIAGNOSIS — IMO0001 Reserved for inherently not codable concepts without codable children: Secondary | ICD-10-CM | POA: Diagnosis not present

## 2011-10-01 DIAGNOSIS — M6281 Muscle weakness (generalized): Secondary | ICD-10-CM | POA: Diagnosis not present

## 2011-10-01 NOTE — Progress Notes (Signed)
Physical Therapy Treatment Patient Details  Name: [name] MRN: [id number] Date of Birth: [birth year]  Today's Date: 10/01/2011 Time: 2130-8657 PT Time Calculation (min): 46 min  Visit#: 7  of 8   Re-eval: 10/03/11   There ex 24/ massage 19  Subjective: Symptoms/Limitations Symptoms: Pt states she has felt shaky last night and all today.  BP 125/69 pulse 87.  Pt states pulse is normal for her. Pain Assessment Currently in Pain?: Yes Pain Score:   4 Pain Location: Back Pain Orientation: Right Pain Type: Chronic pain    Exercise/Treatments     Stretches Double Knee to Chest Stretch: 5 reps;10 seconds Lower Trunk Rotation:  (10) Quadruped Mid Back Stretch: 5 reps Standing Wall Slides: 10 reps Other Standing Lumbar Exercises: wall push up x 10   Prone  Other Prone Lumbar Exercises: IR/ER B  Quadruped  mad cat/old horse x 10  Manual Therapy Manual Therapy: Massage Massage: thoracosacral with grade II mobs   Physical Therapy Assessment and Plan    Pain decreased to a 2 after treatment.  Pt stated shaky feeling had let up significantly.  Plan contract relax for biofeedback to relax legs/back next session.  Goals    Problem List Patient Active Problem List  Diagnoses  . Diverticulitis       GP No functional reporting required  [name],[name] 10/01/2011, 3:14 PM

## 2011-10-03 ENCOUNTER — Ambulatory Visit (HOSPITAL_COMMUNITY)
Admission: RE | Admit: 2011-10-03 | Discharge: 2011-10-03 | Disposition: A | Discharge status: Home / Self Care | Payer: Medicare Other | Source: Ambulatory Visit | Attending: Internal Medicine | Admitting: Internal Medicine

## 2011-10-03 DIAGNOSIS — IMO0001 Reserved for inherently not codable concepts without codable children: Secondary | ICD-10-CM | POA: Diagnosis not present

## 2011-10-03 DIAGNOSIS — M6281 Muscle weakness (generalized): Secondary | ICD-10-CM | POA: Diagnosis not present

## 2011-10-03 DIAGNOSIS — M545 Low back pain: Secondary | ICD-10-CM | POA: Diagnosis not present

## 2011-10-03 NOTE — Evaluation (Signed)
Physical Therapy Re-Evaluation  Patient Details  Name: [name] MRN: [id number] Date of Birth: July 22, 1955  Today's Date: 10/03/2011 Time: 1100-1158 PT Time Calculation (min): 58 min Charges: 1 ROM, 1 MMT, Manual x38, NMR x10, Self x8 Visit#: 8  of 16   Re-eval: 11/02/11    Authorization: MEDICARE  Authorization Time Period: Current: Mobility: CJ, Goal: CI  Authorization Visit#: 8  of 18    Past Medical History:  Past Medical History  Diagnosis Date  . COPD (chronic obstructive pulmonary disease)   . Hypoglycemia   . SVT (supraventricular tachycardia)   . Tachycardia   . Complication of anesthesia     o2 sats dropped after back surgery  . Shortness of breath    Past Surgical History:  Past Surgical History  Procedure Date  . Spinal fusion   . Back surgery   . Colonoscopy 03/22/2011    Procedure: COLONOSCOPY;  Surgeon: Malissa Hippo, MD;  Location: [facility];  Service: Endoscopy;  Laterality: N/A;    Subjective Symptoms/Limitations Symptoms: Pt visibly shaking, reports she has eaten this morning.  States it does not feel like a hypdglymic attack. HR: 77, BP: 133/74, O2 Supine: 100.  Sitting 94% w/4 L. My back is feeling ok, it is just everything else I have going on that is bothering me.   Pain Assessment Currently in Pain?: Yes Pain Score:   1 Pain Location: Back Pain Orientation: Right;Left Pain Type: Chronic pain  Cognition/Observation Observation/Other Assessments Observations: impaired breathing techique, increased anxiety, elevated shoulders, tight clasp while sitting.  Reports she does not know why her body is stressed, when she does not feel stressed.   Sensation/Coordination/Flexibility/Functional Tests Functional Tests Functional Tests: Oswestry: 27/50 - pt reports she does not know exactly how to fill it out because she is so limited by her COPD and states she is "guessing"for most of the questions.  (was 25/50)  Assessment RLE  Strength Right Hip Extension: 4/5 (was 3+/5) Right Knee Flexion: 4/5 (was 3+/5) LLE Strength Left Hip Extension: 4/5 (was 3+/5) Left Knee Flexion: 4/5 (was 3+/5) Lumbar AROM Lumbar Flexion: decreased 10% (decreased 30% motion with hip flexion) Lumbar Extension: decreased 50% - most painful (decreased 30%) Lumbar - Right Side Bend: WNL -mild increased pain  (decreased 30%) Lumbar - Left Side Bend: WNL - increaed pain  (decreased 30%) Lumbar - Right Rotation: WNL - pain free (decreased 50%) Lumbar - Left Rotation: WNL - pain free (decreased 50%) Palpation Palpation: tight paraspinal mm, decreased PA mobility to L2-L5 R and L TP's and L5 SP.  Increased fascial and muscle restrcitions to R coccyx region   Exercise/Treatments  Manual Therapy Manual Therapy: Myofascial release Massage: Grade I-IV PA to L3-S1 R and L TP's w/STM after to decrease muscular spasms. Myofascial Release: to L coccyx region to decrease pain and fascial restrcitions. Other Manual Therapy: Deep pressure applied to shoulders w/blanket over top to decrease visible shaking, with instruction for pursed lip breathing x10 minutes.  (Reports 85% improvement after manual techniques)  Physical Therapy Assessment and Plan PT Assessment and Plan Clinical Impression Statement: [name] has attended 8 OP PT visits to address LBP.  She has met 1/3 STG, and 0/3 LTG.  Added new goal.  She reports that she has had increasing shaking lately.  Vitals were stable and she improved after deep pressure applied.  Continues to have decreased pain after manual techniques and overall has improved lumbar ROM and LE strength.  Pt will benefit from  skilled therapeutic intervention in order to improve on the following deficits: Pain;Increased fascial restricitons;Increased muscle spasms;Decreased strength;Decreased mobility;Improper body mechanics;Impaired tone PT Frequency: Min 2X/week PT Duration: 4 weeks PT Treatment/Interventions: Functional  mobility training;Therapeutic activities;Therapeutic exercise;Neuromuscular re-education;Patient/family education;Other (comment) (manual techniques and modalities for pain) PT Plan: Continue with joint mobilization and manual STM to lumbar spine to decrease pain and muscle spasms, NMR breathing techniques to control ANS hypersensitivity and muscular spams, core strengthening    Goals Home Exercise Program Pt will Perform Home Exercise Program: Independently PT Goal: Perform Home Exercise Program - Progress: Met PT Short Term Goals Time to Complete Short Term Goals: 2 weeks PT Short Term Goal 1: Pain no greater than a 5/10 (At night 7-8/10; Day 3-5/10) PT Short Term Goal 1 - Progress: Partly met PT Short Term Goal 2: Pt able to sleep throughout the night PT Short Term Goal 2 - Progress: Not met PT Short Term Goal 3: New Goal 10/03/11:  Pt will be educated in a breathing program to help decrease her shaking episodes to improve QOL PT Short Term Goal 3 - Progress: Progressing toward goal PT Long Term Goals Time to Complete Long Term Goals: 8 weeks PT Long Term Goal 1: I in advance HEP PT Long Term Goal 1 - Progress: Progressing toward goal PT Long Term Goal 2: Pt pain to be no greater than a 2 75% of the day PT Long Term Goal 2 - Progress: Not met Long Term Goal 3: Pt to be able to sit for an hour without increase pain Long Term Goal 3 Progress: Not met (30 minutes) Long Term Goal 4: Pt to be able to walk for 30 minutes and not have any increase back pain, (pt will not be able to walk more secondary to COPD) Long Term Goal 4 Progress: Progressing toward goal (10 minutes, still unable secondary to COPD)  Problem List Patient Active Problem List  Diagnoses  . Diverticulitis    PT - End of Session Activity Tolerance: Patient tolerated treatment well PT Plan of Care PT Patient Instructions: on compression garments and deep pressure she can do on her own to control her shaking and ANS  hypersensitivity.  Consulted and Agree with Plan of Care: Patient  GP  Functional Reporting Modifier  Current Status  [phone] - Mobility: Walking & Moving Around CJ - At least 20% but less than 40% impaired, limited or restricted  Goal Status  [id number] - Mobility: [place] & Moving Around CI - At least 1% but less than 20% impaired, limited or restricted   [name] 10/03/2011, 12:26 PM  Physician Documentation Your signature is required to indicate approval of the treatment plan as stated above.  Please sign and either send electronically or make a copy of this report for your files and return this physician signed original.   Please mark one 1.__approve of plan  2. ___approve of plan with the following conditions.   ______________________________                                                          _____________________ Physician Signature  Date

## 2011-10-09 ENCOUNTER — Ambulatory Visit (HOSPITAL_COMMUNITY)
Admission: RE | Admit: 2011-10-09 | Discharge: 2011-10-09 | Disposition: A | Discharge status: Home / Self Care | Payer: Medicare Other | Source: Ambulatory Visit | Attending: Neurosurgery | Admitting: Neurosurgery

## 2011-10-09 DIAGNOSIS — IMO0001 Reserved for inherently not codable concepts without codable children: Secondary | ICD-10-CM | POA: Diagnosis not present

## 2011-10-09 DIAGNOSIS — M6281 Muscle weakness (generalized): Secondary | ICD-10-CM | POA: Diagnosis not present

## 2011-10-09 DIAGNOSIS — M545 Low back pain, unspecified: Secondary | ICD-10-CM | POA: Insufficient documentation

## 2011-10-09 NOTE — Progress Notes (Signed)
Physical Therapy Treatment Patient Details  Name: [name] MRN: [id number] Date of Birth: [birth year]  Today's Date: 10/09/2011 Time: 1130-1213 PT Time Calculation (min): 43 min  Visit#: 9  of 16   Re-eval: 11/01/11    Authorization:    Authorization Time Period:    Authorization Visit#:   of     Subjective: Symptoms/Limitations Symptoms: My back hurts all day.  Motion seems to help where staying still increases pain.   Pain Assessment Currently in Pain?: Yes Pain Score:   4 (was an 8 this morning.) Pain Location: Back  Precautions/Restrictions     Exercise/Treatments   Stretches Quadruped Mid Back Stretch: 5 reps   Prone  Single Arm Raise: 10 reps Straight Leg Raise: 10 reps Opposite Arm/Leg Raise: 10 reps    Manual Therapy Manual Therapy: Massage Massage: Grade II PA to L4-S1; f/b massage to decrease spasm  Physical Therapy Assessment and Plan PT Assessment and Plan Clinical Impression Statement: Pt with significant decreased tightness of paraspinal mm after treatment. PT Plan: continue with core stability and STM  add t-band ex next visit.    Goals    Problem List Patient Active Problem List  Diagnoses  . Diverticulitis    PT - End of Session Activity Tolerance: Patient tolerated treatment well General Behavior During Session: [facility] for tasks performed Cognition: [facility] for tasks performed  GP No functional reporting required  Marlies Ligman,[name] 10/09/2011, 12:28 PM

## 2011-10-10 ENCOUNTER — Ambulatory Visit (HOSPITAL_COMMUNITY)
Admission: RE | Admit: 2011-10-10 | Discharge: 2011-10-10 | Disposition: A | Discharge status: Home / Self Care | Payer: Medicare Other | Source: Ambulatory Visit | Attending: Physical Therapy | Admitting: Physical Therapy

## 2011-10-10 NOTE — Progress Notes (Signed)
Physical Therapy Treatment Patient Details  Name: [name] MRN: [id number] Date of Birth: 02/07/1956  Today's Date: 10/10/2011 Time: 9629-5284 PT Time Calculation (min): 52 min  Visit#: 10  of 16   Re-eval: 11/01/11    Authorization:    Authorization Time Period:    Authorization Visit#:   of     Subjective: Symptoms/Limitations Symptoms: My back is tight today Pain Assessment Currently in Pain?: Yes Pain Score:   2 Pain Location: Back    Exercise/Treatments     Stretches Quadruped Mid Back Stretch: 5 reps Prone Mid Back Stretch: 5 reps Standing Row: Both;10 reps;Theraband Theraband Level (Row): Level 3 (Green) Shoulder Extension: Strengthening;Left;Both;10 reps;Theraband Theraband Level (Shoulder Extension): Level 3 (Green)   Supine Other Supine Lumbar Exercises: biofeedback relax/contract from toes to fingers for relaxation of back mm Sidelying Hip Abduction: 15 reps   Quadruped Single Arm Raise: 5 reps Straight Leg Raise: 5 reps  Manual Therapy Manual Therapy: Massage Massage: Grad III to L2-L5 f/b stm to improve motion decrease spasm  Physical Therapy Assessment and Plan PT Assessment and Plan Clinical Impression Statement: Pt responded well to biofeedback with lumbar mm relaxing.   PT Plan: add scapular retraction. assess how pt does with biofeedback techniques at home.  Begin decompression ex 1-5    Goals    Problem List Patient Active Problem List  Diagnoses  . Diverticulitis    PT - End of Session Activity Tolerance: Patient tolerated treatment well General Behavior During Session: [facility] for tasks performed Cognition: [facility] for tasks performed  GP No functional reporting required  Felice Hope,[name] 10/10/2011, 3:30 PM

## 2011-10-14 ENCOUNTER — Inpatient Hospital Stay (HOSPITAL_COMMUNITY): Admission: RE | Admit: 2011-10-14 | Payer: Medicare Other | Source: Ambulatory Visit | Admitting: Physical Therapy

## 2011-10-14 ENCOUNTER — Telehealth (HOSPITAL_COMMUNITY): Payer: Self-pay

## 2011-10-16 ENCOUNTER — Ambulatory Visit (HOSPITAL_COMMUNITY)
Admission: RE | Admit: 2011-10-16 | Discharge: 2011-10-16 | Disposition: A | Discharge status: Home / Self Care | Payer: Medicare Other | Source: Ambulatory Visit | Attending: Internal Medicine | Admitting: Internal Medicine

## 2011-10-16 NOTE — Progress Notes (Addendum)
Physical Therapy Treatment Patient Details  Name: [name] MRN: [id number] Date of Birth: [birth year]  Today's Date: 10/16/2011 Time: 4098-1191 PT Time Calculation (min): 42 min Charges: 1 traction, 10' TE, 10' Self care Visit#: 70  of 16   Re-eval: 11/01/11    Authorization: MEDICARE  Authorization Time Period: Mobility:  Current CJ, Goal: CI  Authorization Visit#: 11  of 18    Subjective: Symptoms/Limitations Symptoms: I have noticed mild improvement in my back pain throughout the day, but I am still having the most difficulty at night.  I wake up around 4 every morning and I try to do a few exercises and it realizes it some, but I can't seem to rest.  I think it would feel great if I was on one of those table that I could hang upside down on.   Educated pt on sleeping patterns and disc rehydration.  Discuss lumbar traction, pt was agreeable to treatment.  Pain Assessment Currently in Pain?: Yes Pain Location: Back Pain Orientation: Right;Left  Exercise/Treatments Stretches Double Knee to Chest Stretch: 1 rep;60 seconds;Other (comment) (w/short and long ballistic movements) Lower Trunk Rotation: Other (comment) (20 reps) Supine Bridge: 5 reps;Limitations Bridge Limitations: w/pelvic rotation 5x on each leg Other Supine Lumbar Exercises: has biofeedback app on iphone she has used 2x.  Educated to continue to use before she goes to bed and before her mid day nap.  Modalities Modalities: Traction Traction Type of Traction: Lumbar Min (lbs): 25 Max (lbs): 50 Hold Time: 60 Rest Time: 15 Time: 17  Physical Therapy Assessment and Plan PT Assessment and Plan Clinical Impression Statement: Pt reports feeling stretched out and feeling good after traction and ther-ex. Educated to continue with biofeedback before resting in her bed.  PT Plan: F/U on lumbar traction.     Problem List Patient Active Problem List  Diagnosis  . Diverticulitis       GP No functional  reporting required  [name] 10/16/2011, 12:02 PM

## 2011-10-17 ENCOUNTER — Other Ambulatory Visit: Payer: Self-pay

## 2011-10-17 DIAGNOSIS — Z124 Encounter for screening for malignant neoplasm of cervix: Secondary | ICD-10-CM | POA: Diagnosis not present

## 2011-10-17 DIAGNOSIS — Z1231 Encounter for screening mammogram for malignant neoplasm of breast: Secondary | ICD-10-CM | POA: Diagnosis not present

## 2011-10-23 ENCOUNTER — Ambulatory Visit (HOSPITAL_COMMUNITY)
Admission: RE | Admit: 2011-10-23 | Discharge: 2011-10-23 | Disposition: A | Discharge status: Home / Self Care | Payer: Medicare Other | Source: Ambulatory Visit | Attending: Internal Medicine | Admitting: Internal Medicine

## 2011-10-23 NOTE — Progress Notes (Signed)
Physical Therapy Treatment Patient Details  Name: [name] MRN: [id number] Date of Birth: 08-28-1955  Today's Date: 10/23/2011 Time: 1115-1213 PT Time Calculation (min): 58 min  Visit#: 12  of 16   Re-eval: 11/01/11    Authorization: Medicare  Authorization Time Period: Current CJ goal Ci  Authorization Visit#:   of     Subjective: Symptoms/Limitations Symptoms: Pt states that the traction did seem to help a little bit last time.  Precautions/Restrictions     Exercise/Treatments Mobility/Balance        Stretches Active Hamstring Stretch: 2 reps;30 seconds Double Knee to Chest Stretch: 2 reps;30 seconds Lower Trunk Rotation: 5 reps Quadruped Mid Back Stretch: 5 reps Prone Mid Back Stretch: 5 reps      Modalities Modalities: Traction Manual Therapy Manual Therapy: Massage Massage: Grade III to reduce spasem Traction Type of Traction: Lumbar Min (lbs): 35 Max (lbs): 50 Hold Time: 60 Rest Time: 15 Time: 15  Physical Therapy Assessment and Plan PT Assessment and Plan Clinical Impression Statement: Pt mm not as tight.  States she felt traction helped last session.  Pt completed stretcheing f/b mobs with massage to imporve lumbar motion and traction. PT Frequency: Min 2X/week PT Treatment/Interventions: Therapeutic exercise;Modalities PT Plan: continue with traction.  Change treatment emphasis on mobility rather than stabiltiy.    Goals    Problem List Patient Active Problem List  Diagnosis  . Diverticulitis    PT - End of Session Activity Tolerance: Patient tolerated treatment well General Behavior During Session: [facility] for tasks performed Cognition: [facility] for tasks performed  GP No functional reporting required  Haylea Schlichting,[name] 10/23/2011, 12:04 PM

## 2011-10-24 ENCOUNTER — Ambulatory Visit (HOSPITAL_COMMUNITY)
Admission: RE | Admit: 2011-10-24 | Discharge: 2011-10-24 | Disposition: A | Discharge status: Home / Self Care | Payer: Medicare Other | Source: Ambulatory Visit | Attending: Internal Medicine | Admitting: Internal Medicine

## 2011-10-24 NOTE — Progress Notes (Signed)
Physical Therapy Treatment Patient Details  Name: [name] MRN: [id number] Date of Birth: 1955/10/02  Today's Date: 10/24/2011 Time: 1020-1118 PT Time Calculation (min): 58 min  Visit#: 13  of 16   Re-eval: 11/01/11  Charge: therex 38 min Traction 17 min  Authorization: Medicare  Authorization Time Period: Current CJ goal CI  Authorization Visit#: 13  of 18    Subjective: Symptoms/Limitations Symptoms: Pt stated that the traction did help a little but unsure if any better than the manual.  Pain scale 3/10. Pain Assessment Currently in Pain?: Yes Pain Score:   3 Pain Location: Back Pain Orientation: Right;Lower  Objective:   Exercise/Treatments Stretches Active Hamstring Stretch: 2 reps;30 seconds Double Knee to Chest Stretch: 2 reps;30 seconds Lower Trunk Rotation: 5 reps Quadruped Mid Back Stretch: 5 reps Prone Mid Back Stretch: 5 reps Seated Other Seated Lumbar Exercises: therapeutic ball pelvic weight gliding R/L, A/P, circles 10 reps, prone on ball following curvature  3x 20" holds, supine on ball 1x   Traction Type of Traction: Lumbar Min (lbs): 40 Max (lbs): 55 Hold Time: 45 Rest Time: 15 Time: 15 (17' total)  Physical Therapy Assessment and Plan PT Assessment and Plan Clinical Impression Statement: Added therapeutic ball activities to promote lumbar mobility.  Pt stated she had a ball at home and would search for it to begin these activities at home.  Increased lumbar traction weight by 5# max and min, pt stated pain reduced to 1/10 at end of session following traction. PT Plan: Continue with current POC.  Treatment emphasis on lumbar mobility, continue with traction.     Goals    Problem List Patient Active Problem List  Diagnosis  . Diverticulitis    PT - End of Session Activity Tolerance: Patient tolerated treatment well General Behavior During Session: [facility] for tasks performed Cognition: [facility] for tasks performed  GP No functional  reporting required  Juel Burrow, PTA 10/24/2011, 11:38 AM

## 2011-10-28 ENCOUNTER — Ambulatory Visit (HOSPITAL_COMMUNITY)
Admission: RE | Admit: 2011-10-28 | Discharge: 2011-10-28 | Disposition: A | Discharge status: Home / Self Care | Payer: Medicare Other | Source: Ambulatory Visit | Attending: Physical Therapy | Admitting: Physical Therapy

## 2011-10-28 NOTE — Progress Notes (Signed)
Physical Therapy Treatment Patient Details  Name: [name] MRN: [id number] Date of Birth: 1955-08-31  Today's Date: 10/28/2011 Time: 4098-1191 PT Time Calculation (min): 68 min  Visit#: 14  of 16   Re-eval: 11/01/11 Charges: Therex x 28' Manual x 10' Mech traction x 17'   Authorization: Medicare  Authorization Time Period: Current CJ goal CI  Authorization Visit#: 14  of 18    Subjective: Symptoms/Limitations Symptoms: Pt states that she beleives that the manual and the traction are helping her back pain. Pain Assessment Currently in Pain?: Yes Pain Score:   3 Pain Location: Back Pain Orientation: Right;Lower   Exercise/Treatments Stretches Active Hamstring Stretch: 2 reps;30 seconds Double Knee to Chest Stretch: 2 reps;30 seconds Lower Trunk Rotation: 5 reps Seated Other Seated Lumbar Exercises: therapeutic ball pelvic weight gliding R/L, A/P, circles 10 reps, supine on ball   Manual Therapy Manual Therapy: Joint mobilization Joint Mobilization:Grade II-III Spinal mobs completed to improve mobility/decrease pain (completed by Annett Fabian, PT) Traction Type of Traction: Lumbar Min (lbs): 40 Max (lbs): 55 Hold Time: 45 Rest Time: 15 Time: 15 (17' total)  Physical Therapy Assessment and Plan PT Assessment and Plan Clinical Impression Statement: Pt presents with improve pelvic mobility on physio ball. No change in traciton wt as pt is very small and reported good results after last session. Pt reports pain decrease to 1.5/10 at end of session. PT Plan: Continue with current POC.  Treatment emphasis on lumbar mobility, continue with traction.      Problem List Patient Active Problem List  Diagnosis  . Diverticulitis    PT - End of Session Activity Tolerance: Patient tolerated treatment well General Behavior During Session: [facility] for tasks performed Cognition: [facility] for tasks performed    Seth Bake, PTA 10/28/2011, 12:21 PM

## 2011-10-30 ENCOUNTER — Inpatient Hospital Stay (HOSPITAL_COMMUNITY): Admission: RE | Admit: 2011-10-30 | Payer: Medicare Other | Source: Ambulatory Visit | Admitting: Physical Therapy

## 2011-11-01 ENCOUNTER — Ambulatory Visit (HOSPITAL_COMMUNITY)
Admission: RE | Admit: 2011-11-01 | Discharge: 2011-11-01 | Disposition: A | Discharge status: Home / Self Care | Payer: Medicare Other | Source: Ambulatory Visit | Attending: Physical Therapy | Admitting: Physical Therapy

## 2011-11-06 ENCOUNTER — Ambulatory Visit (HOSPITAL_COMMUNITY)
Admission: RE | Admit: 2011-11-06 | Discharge: 2011-11-06 | Disposition: A | Discharge status: Home / Self Care | Payer: Medicare Other | Source: Ambulatory Visit | Attending: Neurosurgery | Admitting: Neurosurgery

## 2011-11-06 DIAGNOSIS — M545 Low back pain, unspecified: Secondary | ICD-10-CM | POA: Insufficient documentation

## 2011-11-06 DIAGNOSIS — IMO0001 Reserved for inherently not codable concepts without codable children: Secondary | ICD-10-CM | POA: Diagnosis not present

## 2011-11-06 DIAGNOSIS — M6281 Muscle weakness (generalized): Secondary | ICD-10-CM | POA: Insufficient documentation

## 2011-11-06 NOTE — Progress Notes (Signed)
Physical Therapy Treatment Patient Details  Name: [name] MRN: [id number] Date of Birth: [birth year]  Today's Date: 11/06/2011 Time: 7846-9629 PT Time Calculation (min): 50 min 25' therex 17' traction  Visit#: 15  of 16   Re-eval:      Authorization: Medicare  Authorization Time Period: Current CJ goal CI  Authorization Visit#: 15  of 18    Subjective: Symptoms/Limitations Symptoms: 5/10 LBP middle of spine;slightly to the right. Pain woke pt up at 4am this morning. She can't recall having done anything that might have aggravated her pain. Pain Assessment Pain Score:   5 Pain Location: Back Pain Orientation: Mid;Medial;Right  Precautions/Restrictions     Exercise/Treatments Mobility/Balance        Stretches Active Hamstring Stretch: 3 reps;30 seconds Double Knee to Chest Stretch: 3 reps;30 seconds Lower Trunk Rotation: 5 reps Aerobic   Machines for Strengthening   Standing   Seated Other Seated Lumbar Exercises: therapeutic ball pelvic weight gliding R/L, A/P, circles 10 reps, supine on ball  Supine   Sidelying   Prone    Quadruped    Modalities Modalities: Traction Traction Type of Traction: Lumbar Min (lbs): 40 Max (lbs): 55 Hold Time: 45 Rest Time: 15 Time: 15' (17' total)  Physical Therapy Assessment and Plan PT Assessment and Plan Clinical Impression Statement:  Patient reports LBP pain at end of session 2/10. Patient suggested to use larger (blue) ther ball for supine back stretches instead of green. Continue with traction and increase lumbar stabilization exercises    Goals    Problem List Patient Active Problem List  Diagnosis  . Diverticulitis    PT - End of Session Activity Tolerance: Patient tolerated treatment well General Behavior During Session: [facility] for tasks performed Cognition: [facility] for tasks performed  GP    [name] 11/06/2011, 12:36 PM

## 2011-11-08 ENCOUNTER — Ambulatory Visit (HOSPITAL_COMMUNITY)
Admission: RE | Admit: 2011-11-08 | Discharge: 2011-11-08 | Disposition: A | Discharge status: Home / Self Care | Payer: Medicare Other | Source: Ambulatory Visit | Attending: Internal Medicine | Admitting: Internal Medicine

## 2011-11-08 NOTE — Evaluation (Signed)
Physical Therapy Re-evaluation  Patient Details  Name: [name] MRN: [id number] Date of Birth: 09-Jan-1956  Today's Date: 11/08/2011 Time: 1110-1205 PT Time Calculation (min): 55 min  Visit#: 16  of 16   Re-eval: 11/08/11 Assessment Diagnosis: back pain Next MD Visit: 11/18/2011 Prior Therapy: none  Authorization: MEDICARE  Authorization Time Period: Goal CI; DC CI based on clinical assessment.  Authorization Visit#: 16  of 16    Past Medical History:  Past Medical History  Diagnosis Date  . COPD (chronic obstructive pulmonary disease)   . Hypoglycemia   . SVT (supraventricular tachycardia)   . Tachycardia   . Complication of anesthesia     o2 sats dropped after back surgery  . Shortness of breath    Past Surgical History:  Past Surgical History  Procedure Date  . Spinal fusion   . Back surgery   . Colonoscopy 03/22/2011    Procedure: COLONOSCOPY;  Surgeon: Malissa Hippo, MD;  Location: [facility];  Service: Endoscopy;  Laterality: N/A;    Subjective Symptoms/Limitations Symptoms: [name] states that overall her mobility and flexibility has improved immensely.  Pain currently is a 4/10 How long can you sit comfortably?: The patient states she is unable to sit more than ten minutes before she wants to shift her weight.  This has not changed since her initial evaluation How long can you stand comfortably?: The patient states she still is unable to stand and walk without any significant weight.  This has not changed. How long can you walk comfortably?: The patient is limited in walking due to her breathing, (pt is on continuous 02).   Special Tests: The patient states she is now waking up once a night and then has difficuty falling back to sleep.   Pain Assessment Currently in Pain?: Yes Pain Score:   4 (worst 8/10 this is brief; best 0/10.  PT is at a 3/10 80%/) Pain Orientation: Mid;Lower Pain Type: Chronic pain Pain Onset: More than a month ago Pain  Relieving Factors: exercise.     Prior Functioning  Prior Function Vocation: On disability  Cognition/Observation Cognition Overall Cognitive Status: Appears within functional limits for tasks assessed  Sensation/Coordination/Flexibility/Functional Tests Functional Tests Functional Tests: Oswestry 25/50 (Pt states she does not know how to answer these questions.  )  Assessment RLE Strength Right Hip Flexion: 5/5 Right Hip Extension: 5/5 (was 4/5 last reassessment) Right Hip ABduction: 5/5 Right Hip ADduction: 5/5 Right Knee Flexion: 5/5 (was 4/5 last reassessment) Right Knee Extension: 5/5 Right Ankle Dorsiflexion: 5/5 LLE Strength Left Hip Flexion: 5/5 Left Hip Extension: 5/5 (was 3+/5 last reassessment) Left Hip ABduction: 5/5 Left Hip ADduction: 5/5 Left Knee Flexion: 5/5 (was 4/5 last reassessment) Left Knee Extension: 5/5 Left Ankle Dorsiflexion: 5/5 Lumbar AROM Lumbar Flexion: wnl was decreased by 30% Lumbar Extension: wnl was decreased 305 Lumbar - Right Side Bend: wnl was decreased 30% Lumbar - Left Side Bend: wnl was decreased 30% Lumbar - Right Rotation: wnl was decreased 50% Lumbar - Left Rotation: wnl was decreased 50% Palpation Palpation: tightness in  paraspinal mm has decreased 50%  Exercise/Treatments Mobility/Balance    Pt states mobility is improved significantly since initial evaluation.  Modalities Modalities: Traction Manual Therapy Manual Therapy: Massage Joint Mobilization: grade II and III mobs to improve mobility and decrease pain. Traction Type of Traction: Lumbar Min (lbs): 40 Max (lbs): 50 Hold Time: 60 Rest Time: 15 Time: 15  Physical Therapy Assessment and Plan PT Assessment and Plan  Clinical Impression Statement: Pt has no deficits with objective measurements now but still has c/o pain.  Will see if insurance will cover home traction unit as pt pain significantly decreases after traction, down to a 1-2. PT Plan: Pt to be  discharged from therapy to HEP; encouraged to continue HEP and  get routine massage to help decrease lumbar paraspinal tightening    Goals Home Exercise Program Pt will Perform Home Exercise Program: Independently PT Goal: Perform Home Exercise Program - Progress: Met PT Short Term Goals PT Short Term Goal 1: Pain generally is at a 3/10 but at night will go as high as an 8 PT Short Term Goal 1 - Progress: Progressing toward goal PT Short Term Goal 2: Pt is continuing to wake up once a night due to pain. PT Short Term Goal 2 - Progress: Not met PT Short Term Goal 3 - Progress: Met PT Long Term Goals PT Long Term Goal 1: Pt I in ball exercise program. PT Long Term Goal 1 - Progress: Met PT Long Term Goal 2: Pt states her pain is no greater than a 3 75% of the day goal was to be at a 2 PT Long Term Goal 2 - Progress: Progressing toward goal Long Term Goal 3: Pt still has difficulty with sitting for a prolong time. Long Term Goal 3 Progress: Not met Long Term Goal 4: LTG 4 of being able to walk for 30 minutes will not be obtainable secondary to COPD  Problem List Patient Active Problem List  Diagnosis  . Diverticulitis    PT - End of Session Activity Tolerance: Patient tolerated treatment well General Behavior During Session: [facility] for tasks performed Cognition: [facility] for tasks performed PT Plan of Care Consulted and Agree with Plan of Care: Patient  GP Functional Assessment Tool Used: Clinical assessment-pt given Oswestry but pt states she is just guessing due to not being able to fit into many category ie can lift heavy object but unable to carry; breathing stops walking.... Functional Limitation: Mobility: Walking and moving around Mobility: Walking and Moving Around Goal Status [phone]): At least 1 percent but less than 20 percent impaired, limited or restricted Mobility: Walking and Moving Around Discharge Status [phone]): At least 1 percent but less than 20 percent impaired,  limited or restricted  [name],[name] 11/08/2011, 12:26 PM  Physician Documentation Your signature is required to indicate approval of the treatment plan as stated above.  Please sign and either send electronically or make a copy of this report for your files and return this physician signed original.   Please mark one 1.__approve of plan  2. ___approve of plan with the following conditions.   ______________________________                                                          _____________________ Physician Signature  Date

## 2011-11-11 ENCOUNTER — Ambulatory Visit (HOSPITAL_COMMUNITY): Payer: Medicare Other | Admitting: Physical Therapy

## 2011-11-19 DIAGNOSIS — K055 Other periodontal diseases: Secondary | ICD-10-CM | POA: Diagnosis not present

## 2011-11-19 DIAGNOSIS — R0602 Shortness of breath: Secondary | ICD-10-CM | POA: Diagnosis not present

## 2011-11-19 DIAGNOSIS — K056 Periodontal disease, unspecified: Secondary | ICD-10-CM | POA: Diagnosis not present

## 2011-11-19 DIAGNOSIS — J441 Chronic obstructive pulmonary disease with (acute) exacerbation: Secondary | ICD-10-CM | POA: Diagnosis not present

## 2011-11-19 DIAGNOSIS — J984 Other disorders of lung: Secondary | ICD-10-CM | POA: Diagnosis not present

## 2011-11-19 DIAGNOSIS — M5020 Other cervical disc displacement, unspecified cervical region: Secondary | ICD-10-CM | POA: Diagnosis not present

## 2011-11-19 DIAGNOSIS — M25549 Pain in joints of unspecified hand: Secondary | ICD-10-CM | POA: Diagnosis not present

## 2011-11-19 DIAGNOSIS — J9610 Chronic respiratory failure, unspecified whether with hypoxia or hypercapnia: Secondary | ICD-10-CM | POA: Diagnosis not present

## 2011-11-19 DIAGNOSIS — R0989 Other specified symptoms and signs involving the circulatory and respiratory systems: Secondary | ICD-10-CM | POA: Diagnosis not present

## 2011-11-27 ENCOUNTER — Ambulatory Visit (INDEPENDENT_AMBULATORY_CARE_PROVIDER_SITE_OTHER): Payer: Medicare Other | Admitting: Internal Medicine

## 2011-11-28 ENCOUNTER — Encounter (INDEPENDENT_AMBULATORY_CARE_PROVIDER_SITE_OTHER): Payer: Self-pay | Admitting: Internal Medicine

## 2011-11-28 ENCOUNTER — Other Ambulatory Visit (INDEPENDENT_AMBULATORY_CARE_PROVIDER_SITE_OTHER): Payer: Self-pay | Admitting: *Deleted

## 2011-11-28 ENCOUNTER — Encounter (INDEPENDENT_AMBULATORY_CARE_PROVIDER_SITE_OTHER): Payer: Self-pay | Admitting: *Deleted

## 2011-11-28 ENCOUNTER — Ambulatory Visit (INDEPENDENT_AMBULATORY_CARE_PROVIDER_SITE_OTHER): Payer: Medicare Other | Admitting: Internal Medicine

## 2011-11-28 VITALS — BP 124/64 | HR 84 | Temp 98.4°F | Ht 63.0 in | Wt 101.4 lb

## 2011-11-28 DIAGNOSIS — K219 Gastro-esophageal reflux disease without esophagitis: Secondary | ICD-10-CM

## 2011-11-28 MED ORDER — SUCRALFATE 1 GM/10ML PO SUSP: 1.0000 g | Freq: Four times a day (QID) | ORAL | Status: DC

## 2011-11-28 NOTE — Progress Notes (Signed)
Subjective:     Patient ID: [name], female   DOB: Sep 20, 1955, 56 y.o.   MRN: [id number]  HPI [name] is a 56 yr old female presenting with burning of her lips, gums and throat. She says her lips and gums are burning. She says she has a sorethroat.  Symptoms for a couple of months. She says foods do not feel like they are going down or she may choke.  She has actually bent over and had food to come back up in her mouth. Sometimes her stools are jelly like at time and then others are normal.  Appetite is okay. No weight loss. BM x 1-2 a day.  She is incontinent of stools when she voids.  She says she has seen a Peridontist and has periodontal disease.  She just finished Levaquin empirically. CXR was normal. Had just started Prilosec BID Colonoscopy with polypectomy 03/2011:Impression:  Examination performed to cecum.  Moderate sigmoid diverticulosis.  8 mm polyp snared from distal transverse colon.  Three small polyps at rectum two of which were snared and one was coagulated.  External hemorrhoids and sentinel skin tags. Biopsy: Tubular adenoma. No high grade dysplasia.  Review of Systems see hpi Current Outpatient Prescriptions  Medication Sig Dispense Refill  . albuterol (PROAIR HFA) 108 (90 BASE) MCG/ACT inhaler Inhale 2 puffs into the lungs every 6 (six) hours as needed. For shortness of breath      . ALPRAZolam (XANAX) 0.5 MG tablet Take 0.5 mg by mouth 3 (three) times daily as needed. anxiety       . aspirin 81 MG tablet Take 81 mg by mouth daily.      Marland Kitchen BLACK COHOSH HOT FLASH RELIEF PO Take 1 tablet by mouth 2 (two) times daily.       . cetirizine (ZYRTEC) 10 MG tablet Take 10 mg by mouth daily.        . diazepam (VALIUM) 5 MG tablet Take 5 mg by mouth every 6 (six) hours as needed. Spasms/prescribed after surgery       . diltiazem (DILACOR XR) 180 MG 24 hr capsule Take 180 mg by mouth daily.        Marland Kitchen escitalopram (LEXAPRO) 20 MG tablet Take 20 mg by mouth daily.        .  Fluticasone-Salmeterol (ADVAIR) 250-50 MCG/DOSE AEPB Inhale 1 puff into the lungs daily.       . Multiple Vitamins-Minerals (MULTIVITAMINS THER. W/MINERALS) TABS Take 1 tablet by mouth daily.        Marland Kitchen oxyCODONE (OXYCONTIN) 10 MG 12 hr tablet Take 10 mg by mouth every 12 (twelve) hours.      . roflumilast (DALIRESP) 500 MCG TABS tablet Take 500 mcg by mouth daily.        . rosuvastatin (CRESTOR) 20 MG tablet Take 20 mg by mouth daily.        Marland Kitchen tiotropium (SPIRIVA) 18 MCG inhalation capsule Place 18 mcg into inhaler and inhale daily.       . metaxalone (SKELAXIN) 800 MG tablet Take 800 mg by mouth as needed. spasms      . oxyCODONE-acetaminophen (PERCOCET) 5-325 MG per tablet Take 1 tablet by mouth every 6 (six) hours as needed. For severe pain       . peg 3350 powder (MOVIPREP) 100 G SOLR Take 1 kit (100 g total) by mouth once.  1 kit  0   Past Medical History  Diagnosis Date  . COPD (chronic obstructive pulmonary disease)   .  Hypoglycemia   . SVT (supraventricular tachycardia)   . Tachycardia   . Complication of anesthesia     o2 sats dropped after back surgery  . Shortness of breath    Past Surgical History  Procedure Date  . Spinal fusion   . Back surgery   . Colonoscopy 03/22/2011    Procedure: COLONOSCOPY;  Surgeon: Malissa Hippo, MD;  Location: [facility];  Service: Endoscopy;  Laterality: N/A;   . Family Status  Relation Status Death Age  . Mother Deceased     Heart disease  . Father Deceased     Pneumona, lung disease  . Sister Alive     good health  . Child Alive     good health   History   Social History  . Marital Status: Divorced    Spouse Name: N/A    Number of Children: N/A  . Years of Education: N/A   Occupational History  . Not on file.   Social History Main Topics  . Smoking status: Former Games developer  . Smokeless tobacco: Not on file   Comment: presently smoking about 2 a day  . Alcohol Use: Yes     rare  . Drug Use: No  . Sexually Active: Not  on file   Other Topics Concern  . Not on file   Social History Narrative  . No narrative on file   Family Status  Relation Status Death Age  . Mother Deceased     Heart disease  . Father Deceased     Pneumona, lung disease  . Sister Alive     good health  . Child Alive     good health   Allergies  Allergen Reactions  . Iohexol Anaphylaxis     Desc: IV DYE  ANAPHYLATIC SHOCK X2 ONCE AFTER PREMEDS   . Penicillins Other (See Comments)    unknown         Objective:   Physical Exam Filed Vitals:   11/28/11 1457  Height: 5\' 3"  (1.6 m)  Weight: 101 lb 6.4 oz (45.995 kg)   Alert and oriented. Skin warm and dry. Oral mucosa is moist. White patches to posterior pharynx  . Sclera anicteric, conjunctivae is pink. Thyroid not enlarged. No cervical lymphadenopathy. Lungs clear. Heart regular rate and rhythm.  Abdomen is soft. Bowel sounds are positive. No hepatomegaly. No abdominal masses felt. No tenderness.  No edema to lower extremities.       Assessment:    Dysphagia, Burning in her esophagus. Possible esophagitis.  Structure, candida esophagitis needs to be ruled out.    Plan:    EGD/ED. Carafate 1gm QID. Dexilant samples given to patient. Nystatin Swish and Swallow called to [facility]

## 2011-11-28 NOTE — Patient Instructions (Addendum)
Samples of Dexilant +5 given to patient. EGD/ED with Dr. Karilyn Cota.Switch and swallow called to pharmacy. Carafate e-prescribed

## 2011-11-29 ENCOUNTER — Encounter (HOSPITAL_COMMUNITY): Payer: Self-pay | Admitting: Pharmacy Technician

## 2011-12-04 ENCOUNTER — Telehealth (INDEPENDENT_AMBULATORY_CARE_PROVIDER_SITE_OTHER): Payer: Self-pay | Admitting: Internal Medicine

## 2011-12-04 DIAGNOSIS — R0602 Shortness of breath: Secondary | ICD-10-CM | POA: Diagnosis not present

## 2011-12-04 DIAGNOSIS — J9610 Chronic respiratory failure, unspecified whether with hypoxia or hypercapnia: Secondary | ICD-10-CM | POA: Diagnosis not present

## 2011-12-04 DIAGNOSIS — J029 Acute pharyngitis, unspecified: Secondary | ICD-10-CM | POA: Diagnosis not present

## 2011-12-04 DIAGNOSIS — K219 Gastro-esophageal reflux disease without esophagitis: Secondary | ICD-10-CM | POA: Diagnosis not present

## 2011-12-04 DIAGNOSIS — J449 Chronic obstructive pulmonary disease, unspecified: Secondary | ICD-10-CM | POA: Diagnosis not present

## 2011-12-04 MED ORDER — DEXLANSOPRAZOLE 60 MG PO CPDR: 60.0000 mg | DELAYED_RELEASE_CAPSULE | Freq: Every day | ORAL | Status: DC

## 2011-12-04 NOTE — Telephone Encounter (Signed)
Rx for Dexilant eprescribed to pharmacy

## 2011-12-06 ENCOUNTER — Encounter (HOSPITAL_COMMUNITY): Admission: RE | Payer: Self-pay | Source: Ambulatory Visit

## 2011-12-06 ENCOUNTER — Ambulatory Visit (HOSPITAL_COMMUNITY): Admission: RE | Admit: 2011-12-06 | Payer: Medicare Other | Source: Ambulatory Visit | Admitting: Internal Medicine

## 2011-12-06 SURGERY — ESOPHAGOGASTRODUODENOSCOPY (EGD) WITH ESOPHAGEAL DILATION
Anesthesia: Moderate Sedation

## 2011-12-23 ENCOUNTER — Ambulatory Visit (INDEPENDENT_AMBULATORY_CARE_PROVIDER_SITE_OTHER): Payer: Medicare Other | Admitting: Internal Medicine

## 2011-12-23 ENCOUNTER — Encounter (INDEPENDENT_AMBULATORY_CARE_PROVIDER_SITE_OTHER): Payer: Self-pay | Admitting: Internal Medicine

## 2011-12-23 VITALS — BP 120/80 | HR 80 | Temp 98.4°F | Resp 18 | Ht 63.0 in | Wt 100.7 lb

## 2011-12-23 DIAGNOSIS — K589 Irritable bowel syndrome without diarrhea: Secondary | ICD-10-CM | POA: Diagnosis not present

## 2011-12-23 DIAGNOSIS — K146 Glossodynia: Secondary | ICD-10-CM | POA: Diagnosis not present

## 2011-12-23 DIAGNOSIS — M62838 Other muscle spasm: Secondary | ICD-10-CM | POA: Insufficient documentation

## 2011-12-23 DIAGNOSIS — E785 Hyperlipidemia, unspecified: Secondary | ICD-10-CM

## 2011-12-23 DIAGNOSIS — J449 Chronic obstructive pulmonary disease, unspecified: Secondary | ICD-10-CM | POA: Insufficient documentation

## 2011-12-23 MED ORDER — DIAZEPAM 5 MG PO TABS: 5.0000 mg | ORAL_TABLET | Freq: Two times a day (BID) | ORAL | Status: DC | PRN

## 2011-12-23 NOTE — Patient Instructions (Addendum)
Fiber supplement 3-4 g by mouth daily at bedtime. Take diazepam 5 mg by mouth every morning. Keep stool diary for 4 weeks prior to office visit. Can take Imodium OTC 1 mg by mouth daily if above measures do not help decrease frequency of bowel movements.

## 2011-12-23 NOTE — Progress Notes (Signed)
Presenting complaint;  Persistent burning in mouth and throat. Frequent stools.  Subjective: [name] who is here for reevaluation of multiple GI symptoms. She was last seen by Ms. Setzer NP on 11/28/2011. For  last 3 months she's been experiencing unrelenting burning sensation involving her lips, mouth and throat. Nothing seems to help this symptom. she was switched from Prilosec to Dexilant on her last visit 4 weeks ago. She has noted improvement in her heartburn but not this burning. She was also given prescription for nystatin on her last exam possible candidiasis and she was also complaining of dysphagia. She noted this burning started as she was decreasing nicotine dose in an electronic cigarettes. She recently went back on electronic cigarettes and has noted decrease in severity of burning sensation. She was also seen by her dentist and periodontitis and felt to have mild periodontal disease. She was told the cost to treat her would be $6000 and it may or may not help her symptom.. She continues to complain of postprandial bloating and sporadic regurgitation. She denies dysphagia. She is having an average of 8-9 stools per day. Her baseline was formed stool every other day until she developed diverticulitis last year. Every now and then she may have a formed stool but most of her stools are "jelly crumbles" or bits and pieces. She has a bowel movement every time she voids. She also complains of urgency and mild lower abdominal discomfort. He was treated with course of antibiotic one month ago. She states her breathing has gotten worse over the last few months and she is requiring higher concentration of oxygen and wonders if her GI symptoms other reason for this deterioration. Last year she had an episode of diverticulitis and underwent colonoscopy following recovery with removal of 8 mm adenoma from transverse colon. Polyps in the rectum were hyperplastic. She also  complains of swollen nodes in her neck pain or spasms. She is using diazepam on as-needed basis. She states she seems to breathe better when she takes diazepam.    Current Medications: Current Outpatient Prescriptions  Medication Sig Dispense Refill  . Aclidinium Bromide (TUDORZA PRESSAIR) 400 MCG/ACT AEPB Inhale 400 mcg into the lungs 2 (two) times daily.      Marland Kitchen albuterol (PROAIR HFA) 108 (90 BASE) MCG/ACT inhaler Inhale 2 puffs into the lungs every 6 (six) hours as needed. For shortness of breath      . ALPRAZolam (XANAX) 0.5 MG tablet Take 0.5 mg by mouth daily as needed. anxiety      . aspirin 81 MG tablet Take 81 mg by mouth daily.      Marland Kitchen BLACK COHOSH HOT FLASH RELIEF PO Take 1 tablet by mouth 2 (two) times daily.       . cetirizine (ZYRTEC) 10 MG tablet Take 10 mg by mouth daily.        Marland Kitchen dexlansoprazole (DEXILANT) 60 MG capsule Take 1 capsule (60 mg total) by mouth daily.  90 capsule  3  . diazepam (VALIUM) 5 MG tablet Take 5 mg by mouth every 6 (six) hours as needed. Spasms/prescribed after surgery       . diltiazem (DILACOR XR) 180 MG 24 hr capsule Take 180 mg by mouth daily.        Marland Kitchen escitalopram (LEXAPRO) 20 MG tablet Take 20 mg by mouth daily.        . Fluticasone-Salmeterol (ADVAIR) 250-50 MCG/DOSE AEPB Inhale 1 puff into the lungs 2 (two) times daily.       Marland Kitchen  Multiple Vitamins-Minerals (MULTIVITAMINS THER. W/MINERALS) TABS Take 1 tablet by mouth daily.        Marland Kitchen oxyCODONE (OXYCONTIN) 10 MG 12 hr tablet Take 10 mg by mouth 2 (two) times daily as needed.       Marland Kitchen oxyCODONE-acetaminophen (PERCOCET) 5-325 MG per tablet Take 1 tablet by mouth every 6 (six) hours as needed. For severe pain       . roflumilast (DALIRESP) 500 MCG TABS tablet Take 500 mcg by mouth daily.        . rosuvastatin (CRESTOR) 20 MG tablet Take 20 mg by mouth daily.        . sucralfate (CARAFATE) 1 GM/10ML suspension Take 10 mLs (1 g total) by mouth 4 (four) times daily.  420 mL  1     Objective: Blood  pressure 120/80, pulse 80, temperature 98.4 F (36.9 C), temperature source Oral, resp. rate 18, height 5\' 3"  (1.6 m), weight 100 lb 11.2 oz (45.677 kg). Patient is alert and in no acute distress. She is on O2 via nasal cannula. Conjunctiva is pink. Sclera is nonicteric Oropharyngeal mucosa is normal. Palpation of floor of her mouth, upper mucosa and lips revealed no abnormality. No neck masses or thyromegaly noted. Cardiac exam with regular rhythm normal S1 and S2. No murmur or gallop noted. Auscultation of lungs revealed diminished intensity of breath sounds bilaterally. Abdomen is flat. Bowel sounds are normal. Abdomen is soft with mild mid epigastric tenderness.  No LE edema or clubbing noted.   Assessment:  #1. Oropharyngeal burning syndrome. This symptom does not appear to be secondary to GERD. 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 It is interesting to note that this symptom is not as severe when she is using electronic cigarette. I would not recommend gabapentin or similar medications cause of potential side effects. She may try taking diazepam once or twice daily to see if it helps. I do not believe EGD would help in sorting out this symptom. #2. Frequent stooling. Suspect she has irritable bowel syndrome could have been triggered by bronchodilator therapy. #3. GERD. She is having less retrosternal burning or heartburn as well as decrease in frequency of regurgitation.   Plan: Fiber supplement 3-4 g by mouth daily. Diazepam 5 mg by mouth every morning or twice a day. Prescription for diazepam given to the patient; 60 doses with one refill (please note original prescription was given to her by her neurosurgeon). Imodium OTC 1 mg by mouth every morning if fiber supplement and diazepam does not help slow down frequency of bowel movements. Office visit in 12 weeks. She'll keep symptom diary for 4 weeks prior to next office  visit.

## 2011-12-31 DIAGNOSIS — K219 Gastro-esophageal reflux disease without esophagitis: Secondary | ICD-10-CM | POA: Diagnosis not present

## 2011-12-31 DIAGNOSIS — R0602 Shortness of breath: Secondary | ICD-10-CM | POA: Diagnosis not present

## 2011-12-31 DIAGNOSIS — J441 Chronic obstructive pulmonary disease with (acute) exacerbation: Secondary | ICD-10-CM | POA: Diagnosis not present

## 2011-12-31 DIAGNOSIS — J9610 Chronic respiratory failure, unspecified whether with hypoxia or hypercapnia: Secondary | ICD-10-CM | POA: Diagnosis not present

## 2011-12-31 DIAGNOSIS — R0902 Hypoxemia: Secondary | ICD-10-CM | POA: Diagnosis not present

## 2011-12-31 DIAGNOSIS — K146 Glossodynia: Secondary | ICD-10-CM | POA: Diagnosis not present

## 2011-12-31 DIAGNOSIS — J984 Other disorders of lung: Secondary | ICD-10-CM | POA: Diagnosis not present

## 2012-01-16 ENCOUNTER — Encounter (HOSPITAL_COMMUNITY)
Admission: RE | Admit: 2012-01-16 | Discharge: 2012-01-16 | Disposition: A | Discharge status: Home / Self Care | Payer: Medicare Other | Source: Ambulatory Visit | Attending: Pediatric Pulmonology | Admitting: Pediatric Pulmonology

## 2012-01-16 ENCOUNTER — Encounter (HOSPITAL_COMMUNITY): Payer: Self-pay

## 2012-01-16 VITALS — BP 108/60 | HR 80 | Ht 62.0 in | Wt 98.8 lb

## 2012-01-16 DIAGNOSIS — J449 Chronic obstructive pulmonary disease, unspecified: Secondary | ICD-10-CM | POA: Insufficient documentation

## 2012-01-16 DIAGNOSIS — Z5189 Encounter for other specified aftercare: Secondary | ICD-10-CM | POA: Insufficient documentation

## 2012-01-16 DIAGNOSIS — J4489 Other specified chronic obstructive pulmonary disease: Secondary | ICD-10-CM | POA: Insufficient documentation

## 2012-01-16 NOTE — Progress Notes (Signed)
Patient was referred to Pulmonary Rehab by Dr. Ronnette Juniper due to COPD 496. During orientation advised patient on arrival and appointment times what to wear, what to do before, during and after exercise. Reviewed attendance and class policy. Talked about inclement weather and class consultation policy. Pt is scheduled to start Pulmonary Rehab on 01/20/12 at 1 pm. Pt was advised to come to class 5 minutes before class starts. He was also given instructions on meeting with the dietician and attending the Family Structure classes. Pt is eager to get started.

## 2012-01-16 NOTE — Patient Instructions (Addendum)
Pt has finished orientation and is scheduled to start Pulmonary Rehab on 01/20/12 at 1 pm. Pt has been instructed to arrive to class 15 minutes early for scheduled class. Pt has been instructed to wear comfortable clothing and shoes with rubber soles. Pt has been told to take their medications 1 hour prior to coming to class.  If the patient is not going to attend class, he/she has been instructed to call.   

## 2012-01-20 ENCOUNTER — Encounter (HOSPITAL_COMMUNITY)
Admission: RE | Admit: 2012-01-20 | Discharge: 2012-01-20 | Disposition: A | Discharge status: Home / Self Care | Payer: Medicare Other | Source: Ambulatory Visit | Attending: Pediatric Pulmonology | Admitting: Pediatric Pulmonology

## 2012-01-20 DIAGNOSIS — J449 Chronic obstructive pulmonary disease, unspecified: Secondary | ICD-10-CM | POA: Diagnosis not present

## 2012-01-20 DIAGNOSIS — Z5189 Encounter for other specified aftercare: Secondary | ICD-10-CM | POA: Diagnosis not present

## 2012-01-22 ENCOUNTER — Encounter (HOSPITAL_COMMUNITY)
Admission: RE | Admit: 2012-01-22 | Discharge: 2012-01-22 | Disposition: A | Discharge status: Home / Self Care | Payer: Medicare Other | Source: Ambulatory Visit | Attending: Pediatric Pulmonology | Admitting: Pediatric Pulmonology

## 2012-01-22 DIAGNOSIS — Z5189 Encounter for other specified aftercare: Secondary | ICD-10-CM | POA: Diagnosis not present

## 2012-01-22 DIAGNOSIS — J449 Chronic obstructive pulmonary disease, unspecified: Secondary | ICD-10-CM | POA: Diagnosis not present

## 2012-01-27 ENCOUNTER — Encounter (HOSPITAL_COMMUNITY)
Admission: RE | Admit: 2012-01-27 | Discharge: 2012-01-27 | Disposition: A | Discharge status: Home / Self Care | Payer: Medicare Other | Source: Ambulatory Visit | Attending: Pediatric Pulmonology | Admitting: Pediatric Pulmonology

## 2012-01-27 DIAGNOSIS — J449 Chronic obstructive pulmonary disease, unspecified: Secondary | ICD-10-CM | POA: Diagnosis not present

## 2012-01-27 DIAGNOSIS — Z5189 Encounter for other specified aftercare: Secondary | ICD-10-CM | POA: Diagnosis not present

## 2012-01-29 ENCOUNTER — Encounter (HOSPITAL_COMMUNITY): Payer: Medicare Other

## 2012-02-03 ENCOUNTER — Encounter (HOSPITAL_COMMUNITY)
Admission: RE | Admit: 2012-02-03 | Discharge: 2012-02-03 | Disposition: A | Discharge status: Home / Self Care | Payer: Medicare Other | Source: Ambulatory Visit | Attending: Pediatric Pulmonology | Admitting: Pediatric Pulmonology

## 2012-02-03 DIAGNOSIS — Z5189 Encounter for other specified aftercare: Secondary | ICD-10-CM | POA: Diagnosis not present

## 2012-02-03 DIAGNOSIS — J449 Chronic obstructive pulmonary disease, unspecified: Secondary | ICD-10-CM | POA: Diagnosis not present

## 2012-02-05 ENCOUNTER — Encounter (HOSPITAL_COMMUNITY)
Admission: RE | Admit: 2012-02-05 | Discharge: 2012-02-05 | Disposition: A | Discharge status: Home / Self Care | Payer: Medicare Other | Source: Ambulatory Visit | Attending: Pediatric Pulmonology | Admitting: Pediatric Pulmonology

## 2012-02-05 DIAGNOSIS — Z5189 Encounter for other specified aftercare: Secondary | ICD-10-CM | POA: Diagnosis not present

## 2012-02-05 DIAGNOSIS — J4489 Other specified chronic obstructive pulmonary disease: Secondary | ICD-10-CM | POA: Insufficient documentation

## 2012-02-05 DIAGNOSIS — J449 Chronic obstructive pulmonary disease, unspecified: Secondary | ICD-10-CM | POA: Diagnosis not present

## 2012-02-10 ENCOUNTER — Encounter (HOSPITAL_COMMUNITY): Payer: Medicare Other

## 2012-02-12 ENCOUNTER — Encounter (HOSPITAL_COMMUNITY)
Admission: RE | Admit: 2012-02-12 | Discharge: 2012-02-12 | Disposition: A | Discharge status: Home / Self Care | Payer: Medicare Other | Source: Ambulatory Visit | Attending: Pediatric Pulmonology | Admitting: Pediatric Pulmonology

## 2012-02-17 ENCOUNTER — Encounter (HOSPITAL_COMMUNITY): Payer: Medicare Other

## 2012-02-19 ENCOUNTER — Encounter (HOSPITAL_COMMUNITY): Payer: Medicare Other

## 2012-02-19 DIAGNOSIS — J309 Allergic rhinitis, unspecified: Secondary | ICD-10-CM | POA: Diagnosis not present

## 2012-02-19 DIAGNOSIS — J984 Other disorders of lung: Secondary | ICD-10-CM | POA: Diagnosis not present

## 2012-02-19 DIAGNOSIS — Z23 Encounter for immunization: Secondary | ICD-10-CM | POA: Diagnosis not present

## 2012-02-19 DIAGNOSIS — K219 Gastro-esophageal reflux disease without esophagitis: Secondary | ICD-10-CM | POA: Diagnosis not present

## 2012-02-19 DIAGNOSIS — J9610 Chronic respiratory failure, unspecified whether with hypoxia or hypercapnia: Secondary | ICD-10-CM | POA: Diagnosis not present

## 2012-02-19 DIAGNOSIS — J449 Chronic obstructive pulmonary disease, unspecified: Secondary | ICD-10-CM | POA: Diagnosis not present

## 2012-02-24 ENCOUNTER — Encounter (HOSPITAL_COMMUNITY)
Admission: RE | Admit: 2012-02-24 | Discharge: 2012-02-24 | Disposition: A | Discharge status: Home / Self Care | Payer: Medicare Other | Source: Ambulatory Visit | Attending: Pediatric Pulmonology | Admitting: Pediatric Pulmonology

## 2012-02-26 ENCOUNTER — Encounter (HOSPITAL_COMMUNITY)
Admission: RE | Admit: 2012-02-26 | Discharge: 2012-02-26 | Disposition: A | Discharge status: Home / Self Care | Payer: Medicare Other | Source: Ambulatory Visit | Attending: Pediatric Pulmonology | Admitting: Pediatric Pulmonology

## 2012-03-02 ENCOUNTER — Encounter (HOSPITAL_COMMUNITY)
Admission: RE | Admit: 2012-03-02 | Discharge: 2012-03-02 | Disposition: A | Discharge status: Home / Self Care | Payer: Medicare Other | Source: Ambulatory Visit | Attending: Pediatric Pulmonology | Admitting: Pediatric Pulmonology

## 2012-03-04 ENCOUNTER — Encounter (HOSPITAL_COMMUNITY)
Admission: RE | Admit: 2012-03-04 | Discharge: 2012-03-04 | Disposition: A | Discharge status: Home / Self Care | Payer: Medicare Other | Source: Ambulatory Visit | Attending: Pediatric Pulmonology | Admitting: Pediatric Pulmonology

## 2012-03-09 ENCOUNTER — Encounter (HOSPITAL_COMMUNITY)
Admission: RE | Admit: 2012-03-09 | Discharge: 2012-03-09 | Disposition: A | Discharge status: Home / Self Care | Payer: Medicare Other | Source: Ambulatory Visit | Attending: Pediatric Pulmonology | Admitting: Pediatric Pulmonology

## 2012-03-09 DIAGNOSIS — J4489 Other specified chronic obstructive pulmonary disease: Secondary | ICD-10-CM | POA: Insufficient documentation

## 2012-03-09 DIAGNOSIS — J449 Chronic obstructive pulmonary disease, unspecified: Secondary | ICD-10-CM | POA: Diagnosis not present

## 2012-03-09 DIAGNOSIS — Z5189 Encounter for other specified aftercare: Secondary | ICD-10-CM | POA: Diagnosis not present

## 2012-03-11 ENCOUNTER — Encounter (HOSPITAL_COMMUNITY)
Admission: RE | Admit: 2012-03-11 | Discharge: 2012-03-11 | Disposition: A | Discharge status: Home / Self Care | Payer: Medicare Other | Source: Ambulatory Visit | Attending: Pediatric Pulmonology | Admitting: Pediatric Pulmonology

## 2012-03-11 DIAGNOSIS — Z5189 Encounter for other specified aftercare: Secondary | ICD-10-CM | POA: Diagnosis not present

## 2012-03-11 DIAGNOSIS — J449 Chronic obstructive pulmonary disease, unspecified: Secondary | ICD-10-CM | POA: Diagnosis not present

## 2012-03-16 ENCOUNTER — Encounter (HOSPITAL_COMMUNITY): Payer: Medicare Other

## 2012-03-18 ENCOUNTER — Encounter (HOSPITAL_COMMUNITY): Payer: Medicare Other

## 2012-03-23 ENCOUNTER — Encounter (HOSPITAL_COMMUNITY): Payer: Medicare Other

## 2012-03-24 ENCOUNTER — Ambulatory Visit (INDEPENDENT_AMBULATORY_CARE_PROVIDER_SITE_OTHER): Payer: Medicare Other | Admitting: Internal Medicine

## 2012-03-25 ENCOUNTER — Encounter (HOSPITAL_COMMUNITY)
Admission: RE | Admit: 2012-03-25 | Discharge: 2012-03-25 | Disposition: A | Discharge status: Home / Self Care | Payer: Medicare Other | Source: Ambulatory Visit | Attending: Pediatric Pulmonology | Admitting: Pediatric Pulmonology

## 2012-03-25 DIAGNOSIS — J449 Chronic obstructive pulmonary disease, unspecified: Secondary | ICD-10-CM | POA: Diagnosis not present

## 2012-03-25 DIAGNOSIS — Z5189 Encounter for other specified aftercare: Secondary | ICD-10-CM | POA: Diagnosis not present

## 2012-03-30 ENCOUNTER — Encounter (HOSPITAL_COMMUNITY): Payer: Medicare Other

## 2012-04-01 ENCOUNTER — Encounter (HOSPITAL_COMMUNITY): Payer: Medicare Other

## 2012-04-06 ENCOUNTER — Encounter (HOSPITAL_COMMUNITY): Payer: Medicare Other

## 2012-04-08 ENCOUNTER — Encounter (HOSPITAL_COMMUNITY): Payer: Medicare Other

## 2012-04-13 ENCOUNTER — Encounter (HOSPITAL_COMMUNITY): Payer: Medicare Other

## 2012-04-15 ENCOUNTER — Encounter (HOSPITAL_COMMUNITY)
Admission: RE | Admit: 2012-04-15 | Discharge: 2012-04-15 | Disposition: A | Discharge status: Home / Self Care | Payer: Medicare Other | Source: Ambulatory Visit | Attending: Pediatric Pulmonology | Admitting: Pediatric Pulmonology

## 2012-04-15 DIAGNOSIS — J449 Chronic obstructive pulmonary disease, unspecified: Secondary | ICD-10-CM | POA: Diagnosis not present

## 2012-04-15 DIAGNOSIS — Z5189 Encounter for other specified aftercare: Secondary | ICD-10-CM | POA: Insufficient documentation

## 2012-04-15 DIAGNOSIS — J4489 Other specified chronic obstructive pulmonary disease: Secondary | ICD-10-CM | POA: Insufficient documentation

## 2012-04-20 ENCOUNTER — Encounter (HOSPITAL_COMMUNITY): Payer: Medicare Other

## 2012-04-22 ENCOUNTER — Encounter (HOSPITAL_COMMUNITY)
Admission: RE | Admit: 2012-04-22 | Discharge: 2012-04-22 | Disposition: A | Discharge status: Home / Self Care | Payer: Medicare Other | Source: Ambulatory Visit | Attending: Pediatric Pulmonology | Admitting: Pediatric Pulmonology

## 2012-04-27 ENCOUNTER — Encounter (HOSPITAL_COMMUNITY): Payer: Medicare Other

## 2012-05-04 ENCOUNTER — Encounter (HOSPITAL_COMMUNITY): Payer: Medicare Other

## 2012-05-06 ENCOUNTER — Encounter (HOSPITAL_COMMUNITY): Payer: Medicare Other

## 2012-05-11 ENCOUNTER — Encounter (HOSPITAL_COMMUNITY): Payer: Medicare Other

## 2012-05-13 ENCOUNTER — Encounter (HOSPITAL_COMMUNITY)
Admission: RE | Admit: 2012-05-13 | Discharge: 2012-05-13 | Disposition: A | Discharge status: Home / Self Care | Payer: Medicare Other | Source: Ambulatory Visit | Attending: Pediatric Pulmonology | Admitting: Pediatric Pulmonology

## 2012-05-13 DIAGNOSIS — J449 Chronic obstructive pulmonary disease, unspecified: Secondary | ICD-10-CM | POA: Diagnosis not present

## 2012-05-13 DIAGNOSIS — J4489 Other specified chronic obstructive pulmonary disease: Secondary | ICD-10-CM | POA: Insufficient documentation

## 2012-05-13 DIAGNOSIS — Z5189 Encounter for other specified aftercare: Secondary | ICD-10-CM | POA: Diagnosis not present

## 2012-05-18 ENCOUNTER — Encounter (HOSPITAL_COMMUNITY): Payer: Medicare Other

## 2012-05-19 ENCOUNTER — Encounter (INDEPENDENT_AMBULATORY_CARE_PROVIDER_SITE_OTHER): Payer: Self-pay | Admitting: Internal Medicine

## 2012-05-19 ENCOUNTER — Ambulatory Visit (INDEPENDENT_AMBULATORY_CARE_PROVIDER_SITE_OTHER): Payer: Medicare Other | Admitting: Internal Medicine

## 2012-05-19 VITALS — BP 122/68 | HR 84 | Temp 98.7°F | Resp 20 | Ht 63.0 in | Wt 94.7 lb

## 2012-05-19 DIAGNOSIS — R7309 Other abnormal glucose: Secondary | ICD-10-CM | POA: Diagnosis not present

## 2012-05-19 DIAGNOSIS — K589 Irritable bowel syndrome without diarrhea: Secondary | ICD-10-CM | POA: Diagnosis not present

## 2012-05-19 DIAGNOSIS — K146 Glossodynia: Secondary | ICD-10-CM

## 2012-05-19 DIAGNOSIS — R7303 Prediabetes: Secondary | ICD-10-CM

## 2012-05-19 NOTE — Patient Instructions (Addendum)
Physician will call you with results of blood work when available

## 2012-05-19 NOTE — Progress Notes (Signed)
Presenting complaint;  Follow for diarrhea and burning mouth syndrome. Subjective:  Patient is 57 year old Caucasian female who is here for scheduled visit. She was last seen 4 months ago for burning mouth syndrome and diarrhea. Diarrhea was felt to be secondary to IBS. 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 She was advised to take Imodium 1 mg every morning along with fiber supplement 3-4 g per day and she was also given prescription for diazepam she could use of diarrhea did not respond to loperamide and fiber supplements. She now returns stating that diarrhea is much better. On most days she has 2-3 soft stools. She remains with burning mouth. She seemed to have less burning with nicotine. Her appetite is fair. Heartburn is well controlled with therapy. Her weight is down by 5 pounds since her last visit. She is wondering if her glucose levels have gone up. She has history of present diabetes. She would like for me to check her hemoglobin A1c.  Current Medications: Current Outpatient Prescriptions  Medication Sig Dispense Refill  . albuterol (PROAIR HFA) 108 (90 BASE) MCG/ACT inhaler Inhale 2 puffs into the lungs every 6 (six) hours as needed. For shortness of breath      . ALPRAZolam (XANAX) 0.5 MG tablet Take 0.5 mg by mouth daily as needed. anxiety      . aspirin 81 MG tablet Take 81 mg by mouth daily.      . cetirizine (ZYRTEC) 10 MG tablet Take 10 mg by mouth daily.        . diazepam (VALIUM) 5 MG tablet Take 1 tablet (5 mg total) by mouth 2 (two) times daily as needed. Spasms/prescribed after surgery  60 tablet  1  . diltiazem (DILACOR XR) 180 MG 24 hr capsule Take 180 mg by mouth daily.        Marland Kitchen escitalopram (LEXAPRO) 20 MG tablet Take 20 mg by mouth daily.        . Fluticasone-Salmeterol (ADVAIR) 250-50 MCG/DOSE AEPB Inhale 1 puff into the lungs 2 (two) times daily.       . Multiple Vitamins-Minerals (MULTIVITAMINS THER. W/MINERALS) TABS Take 1 tablet by mouth  daily.        Marland Kitchen oxyCODONE (OXYCONTIN) 10 MG 12 hr tablet Take 10 mg by mouth 2 (two) times daily as needed.       Marland Kitchen oxyCODONE-acetaminophen (PERCOCET) 5-325 MG per tablet Take 1 tablet by mouth every 6 (six) hours as needed. For severe pain       . roflumilast (DALIRESP) 500 MCG TABS tablet Take 500 mcg by mouth daily.        . rosuvastatin (CRESTOR) 20 MG tablet Take 20 mg by mouth daily.        . Tiotropium Bromide Monohydrate (SPIRIVA HANDIHALER IN) Inhale 1 puff into the lungs daily.      Marland Kitchen dexlansoprazole (DEXILANT) 60 MG capsule Take 1 capsule (60 mg total) by mouth daily.  90 capsule  3     Objective: Blood pressure 122/68, pulse 84, temperature 98.7 F (37.1 C), temperature source Oral, resp. rate 20, height 5\' 3"  (1.6 m), weight 94 lb 11.2 oz (42.956 kg). Patient is alert in no acute distress. She has her nasal O2 on. Conjunctiva is pink. Sclera is nonicteric Oropharyngeal mucosa is normal. No neck masses or thyromegaly noted. Cardiac exam with regular rhythm normal S1 and S2. No murmur or gallop noted. Lungs are clear to auscultation. Abdomen is flat soft and nontender without organomegaly or masses.  No LE edema or clubbing noted.    Assessment:  #1. Diarrhea. She significantly improved since last visit. Diarrhea felt to be secondary to irritable bowel syndrome. I would still like her to try diazepam to determine if she gets additional benefit. #2. 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 Diazepam may help. It is interesting to note that nicotine seems to alleviate this symptom. #3. 3 diabetes. She has lost a few pounds. Will check hemoglobin A1c.  Plan:  Hemoglobin A1c. Continue fiber supplement. Can take diazepam 5 mg once or twice daily as needed for diarrhea and mouth symptoms. Office visit in 6 months.

## 2012-05-20 ENCOUNTER — Encounter (HOSPITAL_COMMUNITY)
Admission: RE | Admit: 2012-05-20 | Discharge: 2012-05-20 | Disposition: A | Discharge status: Home / Self Care | Payer: Medicare Other | Source: Ambulatory Visit | Attending: Pediatric Pulmonology | Admitting: Pediatric Pulmonology

## 2012-05-20 DIAGNOSIS — Z5189 Encounter for other specified aftercare: Secondary | ICD-10-CM | POA: Diagnosis not present

## 2012-05-20 DIAGNOSIS — J449 Chronic obstructive pulmonary disease, unspecified: Secondary | ICD-10-CM | POA: Diagnosis not present

## 2012-05-20 LAB — HEMOGLOBIN A1C
Hgb A1c MFr Bld: 5.9 % — ABNORMAL HIGH (ref <5.7)
Mean Plasma Glucose: 123 mg/dL — ABNORMAL HIGH (ref <117)

## 2012-05-21 DIAGNOSIS — J449 Chronic obstructive pulmonary disease, unspecified: Secondary | ICD-10-CM | POA: Diagnosis not present

## 2012-05-21 DIAGNOSIS — F17200 Nicotine dependence, unspecified, uncomplicated: Secondary | ICD-10-CM | POA: Diagnosis not present

## 2012-05-21 DIAGNOSIS — B370 Candidal stomatitis: Secondary | ICD-10-CM | POA: Diagnosis not present

## 2012-05-21 DIAGNOSIS — J9610 Chronic respiratory failure, unspecified whether with hypoxia or hypercapnia: Secondary | ICD-10-CM | POA: Diagnosis not present

## 2012-05-21 DIAGNOSIS — K146 Glossodynia: Secondary | ICD-10-CM | POA: Diagnosis not present

## 2012-05-24 DIAGNOSIS — R7303 Prediabetes: Secondary | ICD-10-CM | POA: Insufficient documentation

## 2012-05-25 ENCOUNTER — Encounter (HOSPITAL_COMMUNITY)
Admission: RE | Admit: 2012-05-25 | Discharge: 2012-05-25 | Disposition: A | Discharge status: Home / Self Care | Payer: Medicare Other | Source: Ambulatory Visit | Attending: Pediatric Pulmonology | Admitting: Pediatric Pulmonology

## 2012-05-25 DIAGNOSIS — Z5189 Encounter for other specified aftercare: Secondary | ICD-10-CM | POA: Diagnosis not present

## 2012-05-25 DIAGNOSIS — J449 Chronic obstructive pulmonary disease, unspecified: Secondary | ICD-10-CM | POA: Diagnosis not present

## 2012-05-27 ENCOUNTER — Encounter (HOSPITAL_COMMUNITY)
Admission: RE | Admit: 2012-05-27 | Discharge: 2012-05-27 | Disposition: A | Discharge status: Home / Self Care | Payer: Medicare Other | Source: Ambulatory Visit | Attending: Pediatric Pulmonology | Admitting: Pediatric Pulmonology

## 2012-05-27 DIAGNOSIS — Z5189 Encounter for other specified aftercare: Secondary | ICD-10-CM | POA: Diagnosis not present

## 2012-05-27 DIAGNOSIS — J449 Chronic obstructive pulmonary disease, unspecified: Secondary | ICD-10-CM | POA: Diagnosis not present

## 2012-06-01 ENCOUNTER — Encounter (HOSPITAL_COMMUNITY): Payer: Medicare Other

## 2012-06-03 ENCOUNTER — Encounter (HOSPITAL_COMMUNITY): Payer: Medicare Other

## 2012-06-08 ENCOUNTER — Encounter (HOSPITAL_COMMUNITY): Payer: Medicare Other

## 2012-06-10 ENCOUNTER — Encounter (HOSPITAL_COMMUNITY): Payer: Medicare Other

## 2012-06-15 ENCOUNTER — Encounter (HOSPITAL_COMMUNITY): Payer: Medicare Other

## 2012-06-16 ENCOUNTER — Other Ambulatory Visit (HOSPITAL_COMMUNITY): Payer: Self-pay | Admitting: Internal Medicine

## 2012-06-16 DIAGNOSIS — Z23 Encounter for immunization: Secondary | ICD-10-CM | POA: Diagnosis not present

## 2012-06-16 DIAGNOSIS — R51 Headache: Secondary | ICD-10-CM

## 2012-06-16 DIAGNOSIS — R159 Full incontinence of feces: Secondary | ICD-10-CM | POA: Diagnosis not present

## 2012-06-16 DIAGNOSIS — Z681 Body mass index (BMI) 19 or less, adult: Secondary | ICD-10-CM | POA: Diagnosis not present

## 2012-06-16 DIAGNOSIS — R413 Other amnesia: Secondary | ICD-10-CM | POA: Diagnosis not present

## 2012-06-17 ENCOUNTER — Encounter (HOSPITAL_COMMUNITY): Payer: Medicare Other

## 2012-06-17 ENCOUNTER — Ambulatory Visit (HOSPITAL_COMMUNITY)
Admission: RE | Admit: 2012-06-17 | Discharge: 2012-06-17 | Disposition: A | Discharge status: Home / Self Care | Payer: Medicare Other | Source: Ambulatory Visit | Attending: Internal Medicine | Admitting: Internal Medicine

## 2012-06-17 DIAGNOSIS — R259 Unspecified abnormal involuntary movements: Secondary | ICD-10-CM | POA: Diagnosis not present

## 2012-06-17 DIAGNOSIS — R51 Headache: Secondary | ICD-10-CM | POA: Diagnosis not present

## 2012-06-20 ENCOUNTER — Other Ambulatory Visit: Payer: Self-pay

## 2012-06-22 ENCOUNTER — Encounter (HOSPITAL_COMMUNITY): Payer: Medicare Other

## 2012-06-24 ENCOUNTER — Encounter (HOSPITAL_COMMUNITY): Payer: Medicare Other

## 2012-06-25 DIAGNOSIS — R413 Other amnesia: Secondary | ICD-10-CM | POA: Diagnosis not present

## 2012-06-25 DIAGNOSIS — R32 Unspecified urinary incontinence: Secondary | ICD-10-CM | POA: Diagnosis not present

## 2012-06-25 DIAGNOSIS — R197 Diarrhea, unspecified: Secondary | ICD-10-CM | POA: Diagnosis not present

## 2012-06-27 DIAGNOSIS — R413 Other amnesia: Secondary | ICD-10-CM | POA: Diagnosis not present

## 2012-06-27 DIAGNOSIS — M542 Cervicalgia: Secondary | ICD-10-CM | POA: Diagnosis not present

## 2012-06-29 ENCOUNTER — Encounter (HOSPITAL_COMMUNITY): Payer: Medicare Other

## 2012-07-01 ENCOUNTER — Encounter (HOSPITAL_COMMUNITY): Payer: Medicare Other

## 2012-07-06 ENCOUNTER — Encounter (HOSPITAL_COMMUNITY): Payer: Medicare Other

## 2012-07-08 ENCOUNTER — Encounter (HOSPITAL_COMMUNITY): Payer: Medicare Other

## 2012-07-13 ENCOUNTER — Encounter (HOSPITAL_COMMUNITY): Payer: Medicare Other

## 2012-07-15 ENCOUNTER — Encounter (HOSPITAL_COMMUNITY): Payer: Medicare Other

## 2012-07-17 NOTE — Progress Notes (Signed)
Pulmonary Rehabilitation Program Outcomes Report   Orientation:  01/16/2012 !st week report: 01/27/2012 Graduate Date:  tbd  Discharge Date:  tbd # of sessions completed: 3 DX: COPD  Pulmonologist: Dr. Ronnette Juniper Family MD:  Dr. Sigmund Hazel Time:  13:00  A.  Exercise Program:  Tolerates exercise @ 3.99 METS for 15 minutes and Walk Test Results:  Pre: Pre 6 minute Walk test, Resting HR 80, BP 108/60, O2 97%, RPE 9, RPD 9,, 6 min HR 91, BP 142/50, O2 95%, RPE 15 and RPD 15, Post HR 76,  BP 102/60, O2 99%, and RPE 7, and RPD 9. Walked 310ft at 1.1 mph.  B.  Mental Health:  Good mental attitude  C.  Education/Instruction/Skills  Accurately checks own pulse.  Rest:  89  Exercise:  105, Knows THR for exercise and Uses Perceived Exertion Scale and/or Dyspnea Scale  Uses Perceived Exertion Scale and/or Dyspnea Scale  D.  Nutrition/Weight Control/Body Composition:  Adherence to prescribed nutrition program: good  and There is no weight on file to calculate BMI.   E.  Blood Lipids    No results found for this basename: CHOL, HDL, LDLCALC, LDLDIRECT, TRIG, CHOLHDL    F.  Lifestyle Changes:  Making positive lifestyle changes  G.  Symptoms noted with exercise:  Asymptomatic  Report Completed By:  Lelon Huh. Ollivander See RN   Comments:  This is patients 1st week report. Her resting BP was 110/62 and resting Hr was 89 and Peak BP was 112/80 and peak HR was 105. A halfway report will follow on her 12th visit.

## 2012-07-17 NOTE — Progress Notes (Signed)
Pulmonary Rehabilitation Program Outcomes Report   Orientation:  01/16/2012 Halfway report: 03/11/2012 Graduate Date:  tbd Discharge Date:  tbd # of sessions completed: 12 DX: COPD  Pulmonologist: Dr. Ronnette Juniper Family MD:  Dr. Sigmund Hazel Time:  13:00  A.  Exercise Program:  Tolerates exercise @ 3.92 METS for 15 minutes and Poor attendance due to illness  B.  Mental Health:  Good mental attitude and Quality of Life (QOL)  improvements:  Overall  11.77 %, Health/Functioning 2.00 %, Socioeconomics 28.80 %, Psych/Spiritual 11.86 %, Family 27.00 %    C.  Education/Instruction/Skills  Accurately checks own pulse.  Rest:  95  Exercise:  112, Knows THR for exercise and Uses Perceived Exertion Scale and/or Dyspnea Scale  Uses Perceived Exertion Scale and/or Dyspnea Scale  D.  Nutrition/Weight Control/Body Composition:  Adherence to prescribed nutrition program: good    E.  Blood Lipids    No results found for this basename: CHOL, HDL, LDLCALC, LDLDIRECT, TRIG, CHOLHDL    F.  Lifestyle Changes:  Making positive lifestyle changes  G.  Symptoms noted with exercise:  Asymptomatic  Report Completed By: Lelon Huh. Raylin Winer RN   Comments:  This is patients halfway report . She has done well while in Pulmonary rehab, She achieved a Peak METS of 3.92. Her resting HR was 95 and resting BP was 120/82, Her peak HR was 112 and peak BP was 130/80. A report will follow upon graduation.

## 2012-07-17 NOTE — Progress Notes (Signed)
Pulmonary Rehabilitation Program Outcomes Report   Orientation:  01/16/2012 Graduate Date:  N/A Discharge Date:  05/27/2012 # of sessions completed: 19  Pulmonologist: Dr. Ronnette Juniper Family MD:  Sigmund Hazel Time:  13:00  A.  Exercise Program:  Tolerates exercise @ 3.90 METS for 15 minutes, Poor attendance due to illness and Discharged  B.  Mental Health:  Good mental attitude and Quality of Life (QOL)  improvements:  Overall  11.77 %, Health/Functioning 2.00 %, Socioeconomics 28.80 %, Psych/Spiritual 11.86 %, Family 27.00 %    C.  Education/Instruction/Skills  Accurately checks own pulse.  Rest:  90  Exercise:99 and Uses Perceived Exertion Scale and/or Dyspnea Scale  Anticipated home compliance with respiratory program:  excellent  D.  Nutrition/Weight Control/Body Composition:  Adherence to prescribed nutrition program: good    E.  Blood Lipids    No results found for this basename: CHOL, HDL, LDLCALC, LDLDIRECT, TRIG, CHOLHDL    F.  Lifestyle Changes:  Making positive lifestyle changes  G.  Symptoms noted with exercise:  Asymptomatic  Report Completed By:  Lelon Huh. Ruth Tully RN   Comments:  This is patients last day she attended Pulmonary Rehab, 05/27/2012. She attended 19 out of 24 sessions. She was absent a lot from breathing issues. Her resting HR was 90 and resting BP was 122/70. Her peak Hr was 99 and peak BP was 138/60. She did very well while in the program. She did not return to finish the program.

## 2012-07-28 NOTE — Patient Instructions (Signed)
Patient was instructed to continue to keep doctors appointment for her chronic condition.

## 2012-07-28 NOTE — Progress Notes (Signed)
Patient stopped Pulmonary Rehabilitation today on 05/27/12 after completing 19 sessions. She did not complete any of the exit test before stopping. Cardiac Rehab staff will make f/u calls at 1 month, 6 months, and 1 year.

## 2012-08-10 DIAGNOSIS — J449 Chronic obstructive pulmonary disease, unspecified: Secondary | ICD-10-CM | POA: Diagnosis not present

## 2012-08-10 DIAGNOSIS — E782 Mixed hyperlipidemia: Secondary | ICD-10-CM | POA: Diagnosis not present

## 2012-08-10 DIAGNOSIS — R079 Chest pain, unspecified: Secondary | ICD-10-CM | POA: Diagnosis not present

## 2012-08-13 ENCOUNTER — Other Ambulatory Visit (HOSPITAL_COMMUNITY): Payer: Self-pay | Admitting: Cardiovascular Disease

## 2012-08-13 DIAGNOSIS — R079 Chest pain, unspecified: Secondary | ICD-10-CM

## 2012-08-19 ENCOUNTER — Ambulatory Visit (HOSPITAL_COMMUNITY)
Admission: RE | Admit: 2012-08-19 | Discharge: 2012-08-19 | Disposition: A | Discharge status: Home / Self Care | Payer: Medicare Other | Source: Ambulatory Visit | Attending: Cardiovascular Disease | Admitting: Cardiovascular Disease

## 2012-08-19 DIAGNOSIS — R079 Chest pain, unspecified: Secondary | ICD-10-CM | POA: Insufficient documentation

## 2012-08-19 MED ORDER — TECHNETIUM TC 99M SESTAMIBI GENERIC - CARDIOLITE
8.7000 | Freq: Once | INTRAVENOUS | Status: AC | PRN
  Administered 2012-08-19: 9 via INTRAVENOUS

## 2012-08-19 MED ORDER — DOBUTAMINE INFUSION FOR EP/ECHO/NUC (1000 MCG/ML)
0.0000 ug/kg/min | INTRAVENOUS | Status: DC
  Administered 2012-08-19: 30 ug/kg/min via INTRAVENOUS

## 2012-08-19 MED ORDER — TECHNETIUM TC 99M SESTAMIBI GENERIC - CARDIOLITE
26.0000 | Freq: Once | INTRAVENOUS | Status: AC | PRN
  Administered 2012-08-19: 26 via INTRAVENOUS

## 2012-08-19 NOTE — Procedures (Addendum)
[facility] [address] [place] [address] [phone]  Cardiology Nuclear Med Study  [name] is a 57 y.o. female     MRN : [id number]     DOB: 1955-11-23  Procedure Date: 08/19/2012  Nuclear Med Background Indication for Stress Test:  Evaluation for Ischemia and PTCA Patency History:  COPD and PTCA--07/05/2003 Cardiac Risk Factors: Family History - CAD, Hypertension, Lipids and Smoker  Symptoms:  Chest Pain, Dizziness, DOE, Fatigue, Light-Headedness, Near Syncope, Palpitations and SOB   Nuclear Pre-Procedure Caffeine/Decaff Intake:  1:00am NPO After: 11 AM   IV Site: R Forearm  IV 0.9% NS with Angio Cath:  22g  Chest Size (in):  N/A IV Started by: Emmit Pomfret, RN  Height: 5\' 3"  (1.6 m)  Cup Size: B  BMI:  Body mass index is 16.83 kg/(m^2). Weight:  95 lb (43.092 kg)   Tech Comments:  N/A    Nuclear Med Study 1 or 2 day study: 1 day  Stress Test Type:  Dobutamine  Order Authorizing Provider:  Nanetta Batty, MD   Resting Radionuclide: Technetium 69m Sestamibi  Resting Radionuclide Dose: 8.7 mCi   Stress Radionuclide:  Technetium 74m Sestamibi  Stress Radionuclide Dose: 26.0 mCi           Stress Protocol Rest HR: 104 Stress HR: 142  Rest BP:132/83 Stress BP:155/78  Exercise Time (min): n/a METS: n/a   Predicted Max HR: 165 bpm % Max HR: 86.06 bpm Rate Pressure Product: 28413  Dose of Adenosine (mg):  n/a Dose of Lexiscan: n/a mg  Dose of Atropine (mg): n/a Dose of Dobutamine: n/a mcg/kg/min (at max HR)  Stress Test Technologist: Ernestene Mention, CCT Nuclear Technologist: Koren Shiver, CNMT   Rest Procedure:  Myocardial perfusion imaging was performed at rest 45 minutes following the intravenous administration of Technetium 82m Sestamibi. Stress Procedure:  The patient received IV dobutamine and no IV atropine.  There were no significant changes with infusion.  Technetium 61m Sestamibi was injected  at peak heart rate and quantitative spect images were obtained after a 45 minute delay.  Transient Ischemic Dilatation (Normal <1.22):  0.99 Lung/Heart Ratio (Normal <0.45):  0.26 QGS EDV:  45 ml QGS ESV:  8 ml LV Ejection Fraction: 83%  Signed by       Rest ECG: NSR - Normal EKG  Stress ECG: No significant change from baseline ECG  QPS Raw Data Images:  Normal; no motion artifact; normal heart/lung ratio. Stress Images:  Normal homogeneous uptake in all areas of the myocardium. Rest Images:  Normal homogeneous uptake in all areas of the myocardium. Subtraction (SDS):  No evidence of ischemia.  Impression Exercise Capacity:  Dobutamine study with no exercise. BP Response:  Normal blood pressure response. Clinical Symptoms:  No significant symptoms noted. ECG Impression:  No significant ST segment change suggestive of ischemia. Comparison with Prior Nuclear Study: No significant change from previous study  Overall Impression:  Normal stress nuclear study.  LV Wall Motion:  NL LV Function; NL Wall Motion   Runell Gess, MD  08/19/2012 5:58 PM

## 2012-08-20 DIAGNOSIS — J309 Allergic rhinitis, unspecified: Secondary | ICD-10-CM | POA: Diagnosis not present

## 2012-08-20 DIAGNOSIS — J449 Chronic obstructive pulmonary disease, unspecified: Secondary | ICD-10-CM | POA: Diagnosis not present

## 2012-08-20 DIAGNOSIS — J9610 Chronic respiratory failure, unspecified whether with hypoxia or hypercapnia: Secondary | ICD-10-CM | POA: Diagnosis not present

## 2012-08-20 DIAGNOSIS — B370 Candidal stomatitis: Secondary | ICD-10-CM | POA: Diagnosis not present

## 2012-08-20 DIAGNOSIS — R0902 Hypoxemia: Secondary | ICD-10-CM | POA: Diagnosis not present

## 2012-08-31 ENCOUNTER — Encounter: Payer: Self-pay | Admitting: Neurology

## 2012-08-31 ENCOUNTER — Ambulatory Visit (INDEPENDENT_AMBULATORY_CARE_PROVIDER_SITE_OTHER): Payer: Medicare Other | Admitting: Neurology

## 2012-08-31 VITALS — BP 122/66 | HR 86 | Ht 61.5 in | Wt 94.5 lb

## 2012-08-31 DIAGNOSIS — R259 Unspecified abnormal involuntary movements: Secondary | ICD-10-CM | POA: Diagnosis not present

## 2012-08-31 DIAGNOSIS — R251 Tremor, unspecified: Secondary | ICD-10-CM

## 2012-08-31 NOTE — Progress Notes (Signed)
HPI:  [name] is a 57 years old right-handed Caucasian female, referred by her primary care physician Dr. Elfredia Nevins accompanied by her sister, for evaluation of constellation of symptoms.  She has past medical history of COPD, home oxygen dependent for 2 years, also had a history of cervical infusion by Dr. Lovell Sheehan, last surgery was 2 years ago .  Review EPIC, she had a history of difficulty void and hesitancy had urethral dilatation in 2008.  She reported subacute worsening of functional status over the past 6 months, initial symptoms was dropped off her oxygen saturation from upper nineties to lower nineties, after consultant with her pulmonologist Dr. [name], she increased her nasal oxygen from 3 to 4 L every minutes, which failed to improve her symptoms, she later developed  diffuse lower abdominal pain, intermixed diarrhea, and constipation, she was diagnosed with diverticulitis, was treated with antibiotics, which has improved her symptoms,but ever since the event, she began to notice profound bowel and bladder incontinence, sometimes she would have urgency but could not make it, but sometimes she could not sense the signal,  She also complains of recurrent left buttock area shingle, oral thrush, burning mouth, bleeding gum, bilateral hands, whole-body tremor, difficulty concentrating, could not make a complicated meal, also become very emotional, anxious, lost her appetite  Her symptoms have plataued over the past few months   MRI brain showed  Subcortical, periventricular, brainstem white matter hypodensity, suggestive of small vessel disease, minimum compression compared to MRI scan in February 2008, no involvement in corpus callosum, MRI cervical spine showed post operative changes of anterior cervical spine from C4-7, without significant compression,  Laboratory evaluation showed negative or normal CBC, with elevated WBC 11.5, CMP, HIV, TSH, ANA, vitamin B12, C. reactive protein, hepatitis  A B. C.  She continued to complains of bilateral hands, whole-body jerking movement, she denies gait difficulty,  Physical Exam  Neck: supple no carotid bruits Respiratory: clear to auscultation bilaterally Cardiovascular: regular rate rhythm  Neurologic Exam  Mental Status: anxious oxygen dependent,  cooperative to history taking and casual conversation. Cranial Nerves: CN II-XII pupils were equal round reactive to light.  Fundi were sharp bilaterally.  Extraocular movements were full.  Visual fields were full on confrontational test.  Facial sensation and strength were normal.  Hearing was intact to finger rubbing bilaterally.  Uvula tongue were midline.  Head turning and shoulder shrugging were normal and symmetric.  Tongue protrusion into the cheeks strength were normal.  Motor: Normal tone, bulk, and strength, with mild bilateral hand postural tremor. Sensory: length dependent decreased light touch, pinprick to distal shin, preserved toe  proprioception, and vibratory sensation. Coordination: Normal finger-to-nose, heel-to-shin.  There was no dysmetria noticed. Gait and Station: Narrow based and steady, was able to perform tiptoe, heel, and tandem walking without difficulty.  Romberg sign: Negative Reflexes: Deep tendon reflexes: Biceps: 3/3, Brachioradialis: 3/3, Triceps: 3/3, Pateller: 3/3, Achilles:1/1.  Plantar responses are flexor.   Assessment and plan: 18 Caucasian female, with past medical history of COPD home oxygen dependent, multiple cervical decompression surgery now presenting with six-month history of urinary and bowel incontinence, bilateral hands tremor, gait difficulty, intermittent confusion, difficulty focusing, burning mouth, recurrent shingles, she was hyperreflexia on examination,  Tremor present also consistent with mild myoclonic jerking-like movements, there was no parkinsonian features, history and examination also does not support essential tremor   RTC as  needed.

## 2012-10-28 DIAGNOSIS — J9610 Chronic respiratory failure, unspecified whether with hypoxia or hypercapnia: Secondary | ICD-10-CM | POA: Diagnosis not present

## 2012-10-28 DIAGNOSIS — B370 Candidal stomatitis: Secondary | ICD-10-CM | POA: Diagnosis not present

## 2012-10-28 DIAGNOSIS — J449 Chronic obstructive pulmonary disease, unspecified: Secondary | ICD-10-CM | POA: Diagnosis not present

## 2012-10-28 DIAGNOSIS — J984 Other disorders of lung: Secondary | ICD-10-CM | POA: Diagnosis not present

## 2012-10-28 DIAGNOSIS — R0902 Hypoxemia: Secondary | ICD-10-CM | POA: Diagnosis not present

## 2012-11-10 DIAGNOSIS — H1030 Unspecified acute conjunctivitis, unspecified eye: Secondary | ICD-10-CM | POA: Diagnosis not present

## 2012-11-16 ENCOUNTER — Ambulatory Visit (INDEPENDENT_AMBULATORY_CARE_PROVIDER_SITE_OTHER): Payer: Medicare Other | Admitting: Internal Medicine

## 2012-11-18 DIAGNOSIS — H04129 Dry eye syndrome of unspecified lacrimal gland: Secondary | ICD-10-CM | POA: Diagnosis not present

## 2013-01-07 DIAGNOSIS — K146 Glossodynia: Secondary | ICD-10-CM | POA: Diagnosis not present

## 2013-01-07 DIAGNOSIS — J9610 Chronic respiratory failure, unspecified whether with hypoxia or hypercapnia: Secondary | ICD-10-CM | POA: Diagnosis not present

## 2013-01-07 DIAGNOSIS — J441 Chronic obstructive pulmonary disease with (acute) exacerbation: Secondary | ICD-10-CM | POA: Diagnosis not present

## 2013-01-07 DIAGNOSIS — B370 Candidal stomatitis: Secondary | ICD-10-CM | POA: Diagnosis not present

## 2013-01-22 ENCOUNTER — Other Ambulatory Visit: Payer: Self-pay | Admitting: *Deleted

## 2013-01-22 MED ORDER — ROSUVASTATIN CALCIUM 20 MG PO TABS: 20.0000 mg | ORAL_TABLET | Freq: Every day | ORAL | Status: DC

## 2013-02-18 DIAGNOSIS — J449 Chronic obstructive pulmonary disease, unspecified: Secondary | ICD-10-CM | POA: Diagnosis not present

## 2013-02-18 DIAGNOSIS — J9610 Chronic respiratory failure, unspecified whether with hypoxia or hypercapnia: Secondary | ICD-10-CM | POA: Diagnosis not present

## 2013-02-18 DIAGNOSIS — J984 Other disorders of lung: Secondary | ICD-10-CM | POA: Diagnosis not present

## 2013-02-18 DIAGNOSIS — J309 Allergic rhinitis, unspecified: Secondary | ICD-10-CM | POA: Diagnosis not present

## 2013-02-18 DIAGNOSIS — Z23 Encounter for immunization: Secondary | ICD-10-CM | POA: Diagnosis not present

## 2013-02-18 DIAGNOSIS — F17200 Nicotine dependence, unspecified, uncomplicated: Secondary | ICD-10-CM | POA: Diagnosis not present

## 2013-03-02 ENCOUNTER — Ambulatory Visit: Payer: Medicare Other | Admitting: Neurology

## 2013-03-11 ENCOUNTER — Other Ambulatory Visit: Payer: Self-pay

## 2013-03-18 DIAGNOSIS — Z681 Body mass index (BMI) 19 or less, adult: Secondary | ICD-10-CM | POA: Diagnosis not present

## 2013-03-18 DIAGNOSIS — N390 Urinary tract infection, site not specified: Secondary | ICD-10-CM | POA: Diagnosis not present

## 2013-04-08 ENCOUNTER — Other Ambulatory Visit (HOSPITAL_COMMUNITY): Payer: Self-pay | Admitting: Family Medicine

## 2013-04-08 ENCOUNTER — Ambulatory Visit (HOSPITAL_COMMUNITY)
Admission: RE | Admit: 2013-04-08 | Discharge: 2013-04-08 | Disposition: A | Discharge status: Home / Self Care | Payer: Medicare Other | Source: Ambulatory Visit | Attending: Family Medicine | Admitting: Family Medicine

## 2013-04-08 DIAGNOSIS — J449 Chronic obstructive pulmonary disease, unspecified: Secondary | ICD-10-CM | POA: Diagnosis not present

## 2013-04-08 DIAGNOSIS — R933 Abnormal findings on diagnostic imaging of other parts of digestive tract: Secondary | ICD-10-CM | POA: Diagnosis not present

## 2013-04-08 DIAGNOSIS — Z681 Body mass index (BMI) 19 or less, adult: Secondary | ICD-10-CM | POA: Diagnosis not present

## 2013-04-08 DIAGNOSIS — R109 Unspecified abdominal pain: Secondary | ICD-10-CM

## 2013-04-08 DIAGNOSIS — J4489 Other specified chronic obstructive pulmonary disease: Secondary | ICD-10-CM | POA: Insufficient documentation

## 2013-04-08 DIAGNOSIS — J438 Other emphysema: Secondary | ICD-10-CM | POA: Diagnosis not present

## 2013-04-21 ENCOUNTER — Other Ambulatory Visit (HOSPITAL_COMMUNITY): Payer: Self-pay | Admitting: Cardiovascular Disease

## 2013-04-23 NOTE — Telephone Encounter (Signed)
Rx was sent to pharmacy electronically. 

## 2013-05-06 DIAGNOSIS — J189 Pneumonia, unspecified organism: Secondary | ICD-10-CM

## 2013-05-06 HISTORY — DX: Pneumonia, unspecified organism: J18.9

## 2013-07-22 ENCOUNTER — Other Ambulatory Visit: Payer: Self-pay

## 2013-07-22 MED ORDER — DILTIAZEM HCL ER COATED BEADS 180 MG PO CP24: 180.0000 mg | ORAL_CAPSULE | Freq: Every day | ORAL | Status: DC

## 2013-07-22 NOTE — Telephone Encounter (Signed)
Rx was sent to pharmacy electronically. 

## 2013-08-11 ENCOUNTER — Telehealth: Payer: Self-pay | Admitting: Cardiovascular Disease

## 2013-08-13 ENCOUNTER — Encounter: Payer: Self-pay | Admitting: *Deleted

## 2013-08-17 ENCOUNTER — Ambulatory Visit: Payer: Medicare Other | Admitting: Cardiovascular Disease

## 2013-08-26 NOTE — Telephone Encounter (Signed)
Closed encounter °

## 2013-09-08 ENCOUNTER — Encounter: Payer: Self-pay | Admitting: Cardiovascular Disease

## 2013-09-08 ENCOUNTER — Ambulatory Visit (INDEPENDENT_AMBULATORY_CARE_PROVIDER_SITE_OTHER): Payer: Medicare Other | Admitting: Cardiovascular Disease

## 2013-09-08 VITALS — BP 144/68 | HR 88 | Ht 62.0 in | Wt 109.0 lb

## 2013-09-08 DIAGNOSIS — E785 Hyperlipidemia, unspecified: Secondary | ICD-10-CM | POA: Diagnosis not present

## 2013-09-08 DIAGNOSIS — R079 Chest pain, unspecified: Secondary | ICD-10-CM | POA: Insufficient documentation

## 2013-09-08 MED ORDER — ATORVASTATIN CALCIUM 40 MG PO TABS: 40.0000 mg | ORAL_TABLET | Freq: Every day | ORAL | Status: DC

## 2013-09-08 NOTE — Progress Notes (Signed)
Patient ID: [name], female   DOB: 12-26-1955, 58 y.o.   MRN: [id number]     09/08/2013 [name]   Sep 25, 1955  [id number]  Primary Physicia Cassell Smiles., MD Primary Cardiologist: Nanetta Batty, MD  HPI:  58 year old F with advanced COPD on continuous oxygen who presents for routine follow up. From a cardiac standpoint, she had a normal catheterization by Dr. Jenne Campus in 2005. She was last seen in April 2014 and underwent a nuclear stress test that was normal. Since that time she notes intermittent chest pain. She has severe left sided chest pain twice in the last two weeks. She describes it as a Engineer, building services" going through her chest. These episodes lasted for several minutes and resolved on their own. She did not take nitro or aspirin at the time. It was associated with nausea but no vomiting. She is always short of breath and continues for follow up with her pulmonologist in [place], [place].    Current Outpatient Prescriptions  Medication Sig Dispense Refill  . albuterol (PROAIR HFA) 108 (90 BASE) MCG/ACT inhaler Inhale 2 puffs into the lungs every 6 (six) hours as needed. For shortness of breath      . ALPRAZolam (XANAX) 0.5 MG tablet Take 0.5 mg by mouth daily as needed. anxiety      . aspirin 81 MG tablet Take 81 mg by mouth daily.      . cetirizine (ZYRTEC) 10 MG tablet Take 10 mg by mouth daily.        . diazepam (VALIUM) 5 MG tablet Take 1 tablet (5 mg total) by mouth 2 (two) times daily as needed. Spasms/prescribed after surgery  60 tablet  1  . diltiazem (CARDIZEM CD) 180 MG 24 hr capsule Take 1 capsule (180 mg total) by mouth daily.  90 capsule  0  . escitalopram (LEXAPRO) 20 MG tablet Take 20 mg by mouth daily.        . fluconazole (DIFLUCAN) 150 MG tablet 150 mg once a week.       . Fluticasone-Salmeterol (ADVAIR) 250-50 MCG/DOSE AEPB Inhale 1 puff into the lungs 2 (two) times daily.       . Multiple Vitamins-Minerals (MULTIVITAMINS THER. W/MINERALS) TABS Take 1  tablet by mouth daily.        Marland Kitchen oxybutynin (DITROPAN) 5 MG tablet Take 5 mg by mouth daily.       Marland Kitchen oxyCODONE (OXYCONTIN) 10 MG 12 hr tablet Take 10 mg by mouth 2 (two) times daily as needed.       . predniSONE (DELTASONE) 10 MG tablet 10 mg daily.       . roflumilast (DALIRESP) 500 MCG TABS tablet Take 500 mcg by mouth daily.        . Tiotropium Bromide Monohydrate (SPIRIVA HANDIHALER IN) Inhale 1 puff into the lungs daily.       No current facility-administered medications for this visit.    Allergies  Allergen Reactions  . Iohexol Anaphylaxis     Desc: IV DYE  ANAPHYLATIC SHOCK X2 ONCE AFTER PREMEDS   . Chantix [Varenicline] Other (See Comments)    PALPITATIONS  . Ketek [Telithromycin]   . Penicillins Other (See Comments)    unknown  . Prednisone     History   Social History  . Marital Status: Divorced    Spouse Name: N/A    Number of Children: N/A  . Years of Education: N/A   Occupational History  . Not on file.  Social History Main Topics  . Smoking status: Former Games developer  . Smokeless tobacco: Never Used     Comment: presently smoking about 2 a day  . Alcohol Use: Yes     Comment: rare  . Drug Use: No  . Sexual Activity: Not on file   Other Topics Concern  . Not on file   Social History Narrative  . No narrative on file     Review of Systems: General: negative for chills, fever, night sweats or weight changes.  Cardiovascular: positive for chest pain and dyspnea on exertion and shortness of breath; negative edema, orthopnea, palpitations, paroxysmal nocturnal dyspnea Dermatological: negative for rash Respiratory: negative for cough or wheezing Urologic: negative for hematuria Abdominal: negative for nausea, vomiting, diarrhea, bright red blood per rectum, melena, or hematemesis Neurologic: positive for decrease sensation in all extremities; negative for visual changes, syncope, or dizziness All other systems reviewed and are otherwise negative except as  noted above.    Blood pressure 144/68, pulse 88, height 5\' 2"  (1.575 m), weight 109 lb (49.442 kg).  General appearance: alert, cooperative, cachectic, mild distress and thin white female, appears much older than stated age Neck: no adenopathy and no carotid bruit Lungs: wheezes bilaterally and poor air movement, increased work of breathing, increased respiratory phase Heart: regular rate and rhythm and distant heart sounds Extremities: extremities normal, atraumatic, no cyanosis or edema Skin: petechiae - arm(s) bilateral  EKG normal sinus rhythm, rate 88 bpm   ASSESSMENT AND PLAN:   Chest pain A: two episodes of chest pain unlikely to be cardiac given hx of normal cath in 2005 and normal nuclear stress test 1 year ago P: check lipids, f/u in 1 year or sooner if needed    Early Chars, [facility] 09/08/2013 4:29 PM   I have seen and examined [name]  and agree with the assessment and plan Dr. Clinton Sawyer. She has severe oxygen-dependent COPD. Since I saw her last one year ago she's had 2 recent episodes of chest pain. She did have a negative Myoview one year ago and a normal cath back in 2005. At this point I'm going to treat her conservatively unless she has recurrent accelerated episodes. I will see her back in one year.  Runell Gess, M.D., FACP, [facility], [facility] [address] [place], [place]  [address]  [phone] 09/08/2013 5:17 PM

## 2013-09-08 NOTE — Assessment & Plan Note (Signed)
A: two episodes of chest pain unlikely to be cardiac given hx of normal cath in 2005 and normal nuclear stress test 1 year ago P: check lipids, f/u in 1 year or sooner if needed

## 2013-09-08 NOTE — Patient Instructions (Addendum)
Your physician wants you to follow-up in: 1 year with Dr Gwenlyn Found. You will receive a reminder letter in the mail two months in advance. If you don't receive a letter, please call our office to schedule the follow-up appointment.  Change the Crestor to Atorvastatin 40mg  daily.  Do not take your Atorvastatin the day that you take the [place] or the day after.  Please contact our office immediately if you develop any muscle aches or pains that are new or have worsened.

## 2013-10-18 ENCOUNTER — Other Ambulatory Visit: Payer: Self-pay | Admitting: *Deleted

## 2013-10-18 MED ORDER — DILTIAZEM HCL ER COATED BEADS 180 MG PO CP24: 180.0000 mg | ORAL_CAPSULE | Freq: Every day | ORAL | Status: DC

## 2013-10-18 NOTE — Telephone Encounter (Signed)
Rx refill sent to patient pharmacy   

## 2013-10-30 ENCOUNTER — Encounter: Payer: Self-pay | Admitting: Cardiovascular Disease

## 2013-11-16 DIAGNOSIS — J9610 Chronic respiratory failure, unspecified whether with hypoxia or hypercapnia: Secondary | ICD-10-CM | POA: Diagnosis not present

## 2013-11-16 DIAGNOSIS — J449 Chronic obstructive pulmonary disease, unspecified: Secondary | ICD-10-CM | POA: Diagnosis not present

## 2013-11-16 DIAGNOSIS — IMO0002 Reserved for concepts with insufficient information to code with codable children: Secondary | ICD-10-CM | POA: Diagnosis not present

## 2013-11-16 DIAGNOSIS — J309 Allergic rhinitis, unspecified: Secondary | ICD-10-CM | POA: Diagnosis not present

## 2013-11-16 DIAGNOSIS — R0902 Hypoxemia: Secondary | ICD-10-CM | POA: Diagnosis not present

## 2014-01-18 ENCOUNTER — Other Ambulatory Visit: Payer: Self-pay | Admitting: Cardiovascular Disease

## 2014-01-19 NOTE — Telephone Encounter (Signed)
Rx was sent to pharmacy electronically. 

## 2014-02-18 ENCOUNTER — Other Ambulatory Visit: Payer: Self-pay

## 2014-02-24 DIAGNOSIS — F419 Anxiety disorder, unspecified: Secondary | ICD-10-CM | POA: Diagnosis not present

## 2014-02-24 DIAGNOSIS — R000 Tachycardia, unspecified: Secondary | ICD-10-CM | POA: Diagnosis not present

## 2014-02-24 DIAGNOSIS — Z23 Encounter for immunization: Secondary | ICD-10-CM | POA: Diagnosis not present

## 2014-02-24 DIAGNOSIS — J9610 Chronic respiratory failure, unspecified whether with hypoxia or hypercapnia: Secondary | ICD-10-CM | POA: Diagnosis not present

## 2014-02-24 DIAGNOSIS — Z8619 Personal history of other infectious and parasitic diseases: Secondary | ICD-10-CM | POA: Diagnosis not present

## 2014-02-24 DIAGNOSIS — J449 Chronic obstructive pulmonary disease, unspecified: Secondary | ICD-10-CM | POA: Diagnosis not present

## 2014-02-24 DIAGNOSIS — Z716 Tobacco abuse counseling: Secondary | ICD-10-CM | POA: Diagnosis not present

## 2014-02-24 DIAGNOSIS — Z720 Tobacco use: Secondary | ICD-10-CM | POA: Diagnosis not present

## 2014-05-15 DIAGNOSIS — G4733 Obstructive sleep apnea (adult) (pediatric): Secondary | ICD-10-CM | POA: Diagnosis not present

## 2014-05-15 DIAGNOSIS — J9612 Chronic respiratory failure with hypercapnia: Secondary | ICD-10-CM | POA: Diagnosis not present

## 2014-06-01 DIAGNOSIS — B370 Candidal stomatitis: Secondary | ICD-10-CM | POA: Diagnosis not present

## 2014-06-01 DIAGNOSIS — I471 Supraventricular tachycardia: Secondary | ICD-10-CM | POA: Diagnosis not present

## 2014-06-01 DIAGNOSIS — G4733 Obstructive sleep apnea (adult) (pediatric): Secondary | ICD-10-CM | POA: Diagnosis not present

## 2014-06-01 DIAGNOSIS — J9621 Acute and chronic respiratory failure with hypoxia: Secondary | ICD-10-CM | POA: Diagnosis not present

## 2014-06-01 DIAGNOSIS — J9622 Acute and chronic respiratory failure with hypercapnia: Secondary | ICD-10-CM | POA: Diagnosis not present

## 2014-06-01 DIAGNOSIS — J449 Chronic obstructive pulmonary disease, unspecified: Secondary | ICD-10-CM | POA: Diagnosis not present

## 2014-07-07 DIAGNOSIS — J449 Chronic obstructive pulmonary disease, unspecified: Secondary | ICD-10-CM | POA: Diagnosis not present

## 2014-07-07 DIAGNOSIS — F419 Anxiety disorder, unspecified: Secondary | ICD-10-CM | POA: Diagnosis not present

## 2014-07-07 DIAGNOSIS — I471 Supraventricular tachycardia: Secondary | ICD-10-CM | POA: Diagnosis not present

## 2014-07-07 DIAGNOSIS — G4733 Obstructive sleep apnea (adult) (pediatric): Secondary | ICD-10-CM | POA: Diagnosis not present

## 2014-07-07 DIAGNOSIS — J9610 Chronic respiratory failure, unspecified whether with hypoxia or hypercapnia: Secondary | ICD-10-CM | POA: Diagnosis not present

## 2014-07-07 DIAGNOSIS — Z8619 Personal history of other infectious and parasitic diseases: Secondary | ICD-10-CM | POA: Diagnosis not present

## 2014-07-07 DIAGNOSIS — G4752 REM sleep behavior disorder: Secondary | ICD-10-CM | POA: Diagnosis not present

## 2014-09-12 DIAGNOSIS — I471 Supraventricular tachycardia: Secondary | ICD-10-CM | POA: Diagnosis not present

## 2014-09-12 DIAGNOSIS — Z8619 Personal history of other infectious and parasitic diseases: Secondary | ICD-10-CM | POA: Diagnosis not present

## 2014-09-12 DIAGNOSIS — G4752 REM sleep behavior disorder: Secondary | ICD-10-CM | POA: Diagnosis not present

## 2014-09-12 DIAGNOSIS — F419 Anxiety disorder, unspecified: Secondary | ICD-10-CM | POA: Diagnosis not present

## 2014-09-12 DIAGNOSIS — J449 Chronic obstructive pulmonary disease, unspecified: Secondary | ICD-10-CM | POA: Diagnosis not present

## 2014-09-12 DIAGNOSIS — J9610 Chronic respiratory failure, unspecified whether with hypoxia or hypercapnia: Secondary | ICD-10-CM | POA: Diagnosis not present

## 2014-09-12 DIAGNOSIS — G4733 Obstructive sleep apnea (adult) (pediatric): Secondary | ICD-10-CM | POA: Diagnosis not present

## 2014-09-26 DIAGNOSIS — E785 Hyperlipidemia, unspecified: Secondary | ICD-10-CM | POA: Diagnosis not present

## 2014-09-26 DIAGNOSIS — J449 Chronic obstructive pulmonary disease, unspecified: Secondary | ICD-10-CM | POA: Diagnosis not present

## 2014-09-26 DIAGNOSIS — H5203 Hypermetropia, bilateral: Secondary | ICD-10-CM | POA: Diagnosis not present

## 2014-09-26 DIAGNOSIS — Z681 Body mass index (BMI) 19 or less, adult: Secondary | ICD-10-CM | POA: Diagnosis not present

## 2014-09-26 DIAGNOSIS — H52223 Regular astigmatism, bilateral: Secondary | ICD-10-CM | POA: Diagnosis not present

## 2014-09-26 DIAGNOSIS — F419 Anxiety disorder, unspecified: Secondary | ICD-10-CM | POA: Diagnosis not present

## 2014-09-26 DIAGNOSIS — H2510 Age-related nuclear cataract, unspecified eye: Secondary | ICD-10-CM | POA: Diagnosis not present

## 2014-09-26 DIAGNOSIS — H3561 Retinal hemorrhage, right eye: Secondary | ICD-10-CM | POA: Diagnosis not present

## 2014-10-12 ENCOUNTER — Other Ambulatory Visit: Payer: Self-pay | Admitting: Cardiovascular Disease

## 2014-10-12 NOTE — Telephone Encounter (Signed)
Rx(s) sent to pharmacy electronically.  

## 2014-10-17 ENCOUNTER — Other Ambulatory Visit: Payer: Self-pay | Admitting: Cardiovascular Disease

## 2014-10-18 ENCOUNTER — Other Ambulatory Visit: Payer: Self-pay | Admitting: *Deleted

## 2014-10-18 MED ORDER — ATORVASTATIN CALCIUM 40 MG PO TABS: 40.0000 mg | ORAL_TABLET | Freq: Every day | ORAL | Status: DC

## 2014-10-24 DIAGNOSIS — B370 Candidal stomatitis: Secondary | ICD-10-CM | POA: Diagnosis not present

## 2014-10-24 DIAGNOSIS — J9611 Chronic respiratory failure with hypoxia: Secondary | ICD-10-CM | POA: Diagnosis not present

## 2014-10-24 DIAGNOSIS — G4733 Obstructive sleep apnea (adult) (pediatric): Secondary | ICD-10-CM | POA: Diagnosis not present

## 2014-10-24 DIAGNOSIS — J449 Chronic obstructive pulmonary disease, unspecified: Secondary | ICD-10-CM | POA: Diagnosis not present

## 2014-10-24 DIAGNOSIS — G4752 REM sleep behavior disorder: Secondary | ICD-10-CM | POA: Diagnosis not present

## 2014-10-26 DIAGNOSIS — D225 Melanocytic nevi of trunk: Secondary | ICD-10-CM | POA: Diagnosis not present

## 2014-10-26 DIAGNOSIS — D485 Neoplasm of uncertain behavior of skin: Secondary | ICD-10-CM | POA: Diagnosis not present

## 2014-10-26 DIAGNOSIS — L853 Xerosis cutis: Secondary | ICD-10-CM | POA: Diagnosis not present

## 2014-10-26 DIAGNOSIS — C44729 Squamous cell carcinoma of skin of left lower limb, including hip: Secondary | ICD-10-CM | POA: Diagnosis not present

## 2014-10-26 DIAGNOSIS — Z1283 Encounter for screening for malignant neoplasm of skin: Secondary | ICD-10-CM | POA: Diagnosis not present

## 2014-11-14 ENCOUNTER — Emergency Department (HOSPITAL_COMMUNITY): Payer: Medicare Other

## 2014-11-14 ENCOUNTER — Encounter (HOSPITAL_COMMUNITY): Payer: Self-pay | Admitting: Emergency Medicine

## 2014-11-14 ENCOUNTER — Inpatient Hospital Stay (HOSPITAL_COMMUNITY): Payer: Medicare Other

## 2014-11-14 ENCOUNTER — Inpatient Hospital Stay (HOSPITAL_COMMUNITY)
Admission: EM | Admit: 2014-11-14 | Discharge: 2014-11-19 | DRG: 193 | Disposition: A | Discharge status: Home Health | Payer: Medicare Other | Attending: Internal Medicine | Admitting: Internal Medicine

## 2014-11-14 DIAGNOSIS — Z7952 Long term (current) use of systemic steroids: Secondary | ICD-10-CM | POA: Diagnosis not present

## 2014-11-14 DIAGNOSIS — J441 Chronic obstructive pulmonary disease with (acute) exacerbation: Secondary | ICD-10-CM | POA: Diagnosis present

## 2014-11-14 DIAGNOSIS — R109 Unspecified abdominal pain: Secondary | ICD-10-CM

## 2014-11-14 DIAGNOSIS — F419 Anxiety disorder, unspecified: Secondary | ICD-10-CM | POA: Diagnosis present

## 2014-11-14 DIAGNOSIS — J189 Pneumonia, unspecified organism: Secondary | ICD-10-CM | POA: Diagnosis not present

## 2014-11-14 DIAGNOSIS — J13 Pneumonia due to Streptococcus pneumoniae: Principal | ICD-10-CM | POA: Diagnosis present

## 2014-11-14 DIAGNOSIS — E785 Hyperlipidemia, unspecified: Secondary | ICD-10-CM | POA: Diagnosis present

## 2014-11-14 DIAGNOSIS — Z8249 Family history of ischemic heart disease and other diseases of the circulatory system: Secondary | ICD-10-CM

## 2014-11-14 DIAGNOSIS — Z87891 Personal history of nicotine dependence: Secondary | ICD-10-CM

## 2014-11-14 DIAGNOSIS — Z833 Family history of diabetes mellitus: Secondary | ICD-10-CM

## 2014-11-14 DIAGNOSIS — D649 Anemia, unspecified: Secondary | ICD-10-CM

## 2014-11-14 DIAGNOSIS — F17210 Nicotine dependence, cigarettes, uncomplicated: Secondary | ICD-10-CM | POA: Diagnosis not present

## 2014-11-14 DIAGNOSIS — K5730 Diverticulosis of large intestine without perforation or abscess without bleeding: Secondary | ICD-10-CM | POA: Diagnosis not present

## 2014-11-14 DIAGNOSIS — R739 Hyperglycemia, unspecified: Secondary | ICD-10-CM | POA: Diagnosis present

## 2014-11-14 DIAGNOSIS — R0602 Shortness of breath: Secondary | ICD-10-CM | POA: Diagnosis not present

## 2014-11-14 DIAGNOSIS — J9622 Acute and chronic respiratory failure with hypercapnia: Secondary | ICD-10-CM | POA: Diagnosis present

## 2014-11-14 DIAGNOSIS — Z9981 Dependence on supplemental oxygen: Secondary | ICD-10-CM

## 2014-11-14 DIAGNOSIS — J9620 Acute and chronic respiratory failure, unspecified whether with hypoxia or hypercapnia: Secondary | ICD-10-CM | POA: Diagnosis present

## 2014-11-14 DIAGNOSIS — J9602 Acute respiratory failure with hypercapnia: Secondary | ICD-10-CM | POA: Insufficient documentation

## 2014-11-14 DIAGNOSIS — R000 Tachycardia, unspecified: Secondary | ICD-10-CM | POA: Diagnosis not present

## 2014-11-14 DIAGNOSIS — Z7982 Long term (current) use of aspirin: Secondary | ICD-10-CM

## 2014-11-14 DIAGNOSIS — B370 Candidal stomatitis: Secondary | ICD-10-CM | POA: Diagnosis present

## 2014-11-14 DIAGNOSIS — Z825 Family history of asthma and other chronic lower respiratory diseases: Secondary | ICD-10-CM

## 2014-11-14 DIAGNOSIS — J9691 Respiratory failure, unspecified with hypoxia: Secondary | ICD-10-CM | POA: Diagnosis not present

## 2014-11-14 DIAGNOSIS — Z981 Arthrodesis status: Secondary | ICD-10-CM | POA: Diagnosis not present

## 2014-11-14 DIAGNOSIS — K5900 Constipation, unspecified: Secondary | ICD-10-CM | POA: Diagnosis not present

## 2014-11-14 DIAGNOSIS — J9692 Respiratory failure, unspecified with hypercapnia: Secondary | ICD-10-CM | POA: Insufficient documentation

## 2014-11-14 DIAGNOSIS — R0682 Tachypnea, not elsewhere classified: Secondary | ICD-10-CM | POA: Diagnosis not present

## 2014-11-14 HISTORY — DX: Diverticulosis of intestine, part unspecified, without perforation or abscess without bleeding: K57.90

## 2014-11-14 HISTORY — DX: Irritable bowel syndrome, unspecified: K58.9

## 2014-11-14 HISTORY — DX: Hyperlipidemia, unspecified: E78.5

## 2014-11-14 LAB — BLOOD GAS, ARTERIAL
ACID-BASE EXCESS: 11.7 mmol/L — AB (ref 0.0–2.0)
Bicarbonate: 38 mEq/L — ABNORMAL HIGH (ref 20.0–24.0)
DRAWN BY: 21310
O2 Content: 4 L/min
O2 SAT: 96 %
TCO2: 33 mmol/L (ref 0–100)
pCO2 arterial: 74.4 mmHg (ref 35.0–45.0)
pH, Arterial: 7.328 — ABNORMAL LOW (ref 7.350–7.450)
pO2, Arterial: 93.9 mmHg (ref 80.0–100.0)

## 2014-11-14 LAB — CBC WITH DIFFERENTIAL/PLATELET
BASOS PCT: 0 % (ref 0–1)
Basophils Absolute: 0 10*3/uL (ref 0.0–0.1)
Eosinophils Absolute: 0 10*3/uL (ref 0.0–0.7)
Eosinophils Relative: 0 % (ref 0–5)
HCT: 36.9 % (ref 36.0–46.0)
Hemoglobin: 11.9 g/dL — ABNORMAL LOW (ref 12.0–15.0)
LYMPHS PCT: 2 % — AB (ref 12–46)
Lymphs Abs: 0.4 10*3/uL — ABNORMAL LOW (ref 0.7–4.0)
MCH: 32.7 pg (ref 26.0–34.0)
MCHC: 32.2 g/dL (ref 30.0–36.0)
MCV: 101.4 fL — ABNORMAL HIGH (ref 78.0–100.0)
Monocytes Absolute: 1.2 10*3/uL — ABNORMAL HIGH (ref 0.1–1.0)
Monocytes Relative: 5 % (ref 3–12)
NEUTROS ABS: 22.5 10*3/uL — AB (ref 1.7–7.7)
Neutrophils Relative %: 93 % — ABNORMAL HIGH (ref 43–77)
PLATELETS: 320 10*3/uL (ref 150–400)
RBC: 3.64 MIL/uL — AB (ref 3.87–5.11)
RDW: 13.1 % (ref 11.5–15.5)
WBC: 24 10*3/uL — ABNORMAL HIGH (ref 4.0–10.5)

## 2014-11-14 LAB — COMPREHENSIVE METABOLIC PANEL
ALK PHOS: 129 U/L — AB (ref 38–126)
ALT: 21 U/L (ref 14–54)
AST: 26 U/L (ref 15–41)
Albumin: 3.1 g/dL — ABNORMAL LOW (ref 3.5–5.0)
Anion gap: 12 (ref 5–15)
BILIRUBIN TOTAL: 0.5 mg/dL (ref 0.3–1.2)
BUN: 11 mg/dL (ref 6–20)
CO2: 36 mmol/L — AB (ref 22–32)
Calcium: 8.9 mg/dL (ref 8.9–10.3)
Chloride: 93 mmol/L — ABNORMAL LOW (ref 101–111)
Creatinine, Ser: 0.8 mg/dL (ref 0.44–1.00)
Glucose, Bld: 161 mg/dL — ABNORMAL HIGH (ref 65–99)
POTASSIUM: 3.9 mmol/L (ref 3.5–5.1)
SODIUM: 141 mmol/L (ref 135–145)
Total Protein: 7.6 g/dL (ref 6.5–8.1)

## 2014-11-14 LAB — TROPONIN I: Troponin I: <0.03 ng/mL (ref <0.031)

## 2014-11-14 LAB — BRAIN NATRIURETIC PEPTIDE: B NATRIURETIC PEPTIDE 5: 104 pg/mL — AB (ref 0.0–100.0)

## 2014-11-14 LAB — D-DIMER, QUANTITATIVE: D-Dimer, Quant: 2.15 ug/mL-FEU — ABNORMAL HIGH (ref 0.00–0.48)

## 2014-11-14 MED ORDER — IPRATROPIUM-ALBUTEROL 0.5-2.5 (3) MG/3ML IN SOLN
3.0000 mL | Freq: Once | RESPIRATORY_TRACT | Status: AC
  Administered 2014-11-14: 3 mL via RESPIRATORY_TRACT
  Filled 2014-11-14: qty 3

## 2014-11-14 MED ORDER — LEVOFLOXACIN IN D5W 500 MG/100ML IV SOLN
500.0000 mg | Freq: Once | INTRAVENOUS | Status: AC
  Administered 2014-11-14: 500 mg via INTRAVENOUS
  Filled 2014-11-14: qty 100

## 2014-11-14 MED ORDER — METHYLPREDNISOLONE SODIUM SUCC 125 MG IJ SOLR
125.0000 mg | Freq: Once | INTRAMUSCULAR | Status: AC
  Administered 2014-11-14: 125 mg via INTRAVENOUS
  Filled 2014-11-14: qty 2

## 2014-11-14 MED ORDER — SODIUM CHLORIDE 0.9 % IV SOLN
INTRAVENOUS | Status: AC
  Administered 2014-11-15: 01:00:00 via INTRAVENOUS

## 2014-11-14 NOTE — H&P (Signed)
[name] is an 59 y.o. female.    Dr. Gerarda [name] (pcp) [name] (pulmonary , [place], [place])   Chief Complaint: dyspnea HPI: 59 yo female with Copd on home o2, apparently c/o dyspnea x 1 week.  + fever,  + cough (nonproductive),  Denies cp, palp, n/v, diarrhea, brbpr, black stool.  Some right sided pain at time.  Denies dysuria, hematuria.  Pt came to ER because she was tired of being more sob.  Pt was noted to have leukocytosis, and tachycardic.  CXR =>  Emphysema with right lower lung field subsegmental atelectasis and possible small right pleural effusion. Superimposed pneumonia is not excluded. Clinical correlation and follow-up recommended stop.  Pt will be admitted for evaluation of copd exacerbation, and due to + d dimer  , VQ scan is pending.    Past Medical History  Diagnosis Date  . COPD (chronic obstructive pulmonary disease)     on home o2 4LNC  . Hypoglycemia   . SVT (supraventricular tachycardia)   . Tachycardia   . Complication of anesthesia     o2 sats dropped after back surgery  . Shortness of breath   . IBS (irritable bowel syndrome)   . Diverticulosis   . Hyperlipidemia     Past Surgical History  Procedure Laterality Date  . Spinal fusion    . Back surgery    . Colonoscopy  03/22/2011    Procedure: COLONOSCOPY;  Surgeon: Rogene Houston, MD;  Location: [facility];  Service: Endoscopy;  Laterality: N/A;  . Cardiac catheterization  07/05/2003    no significant CAD  . Nm myocar perf wall motion  08/19/2012    normal  . US echocardiography  09/14/2010    mild pulmonary artery hypertension    Family History  Problem Relation Age of Onset  . Asthma Mother   . Heart attack Mother   . Diabetes Mother   . Heart failure Mother    Social History:  reports that she quit smoking about 3 years ago. Her smoking use included Cigarettes. She has a 40 pack-year smoking history. She has never used smokeless tobacco. She reports that she drinks alcohol. She reports  that she does not use illicit drugs.  Allergies:  Allergies  Allergen Reactions  . Iohexol Anaphylaxis     Desc: IV DYE  ANAPHYLATIC SHOCK X2 ONCE AFTER PREMEDS   . Chantix [Varenicline] Other (See Comments)    PALPITATIONS  . Ketek [Telithromycin]   . Penicillins Other (See Comments)    unknown     (Not in a hospital admission)  Results for orders placed or performed during the hospital encounter of 11/14/14 (from the past 48 hour(s))  CBC with Differential/Platelet     Status: Abnormal   Collection Time: 11/14/14  8:30 PM  Result Value Ref Range   WBC 24.0 (H) 4.0 - 10.5 K/uL   RBC 3.64 (L) 3.87 - 5.11 MIL/uL   Hemoglobin 11.9 (L) 12.0 - 15.0 g/dL   HCT 36.9 36.0 - 46.0 %   MCV 101.4 (H) 78.0 - 100.0 fL   MCH 32.7 26.0 - 34.0 pg   MCHC 32.2 30.0 - 36.0 g/dL   RDW 13.1 11.5 - 15.5 %   Platelets 320 150 - 400 K/uL   Neutrophils Relative % 93 (H) 43 - 77 %   Neutro Abs 22.5 (H) 1.7 - 7.7 K/uL   Lymphocytes Relative 2 (L) 12 - 46 %   Lymphs Abs 0.4 (L) 0.7 - 4.0  K/uL   Monocytes Relative 5 3 - 12 %   Monocytes Absolute 1.2 (H) 0.1 - 1.0 K/uL   Eosinophils Relative 0 0 - 5 %   Eosinophils Absolute 0.0 0.0 - 0.7 K/uL   Basophils Relative 0 0 - 1 %   Basophils Absolute 0.0 0.0 - 0.1 K/uL  Comprehensive metabolic panel     Status: Abnormal   Collection Time: 11/14/14  8:30 PM  Result Value Ref Range   Sodium 141 135 - 145 mmol/L   Potassium 3.9 3.5 - 5.1 mmol/L   Chloride 93 (L) 101 - 111 mmol/L   CO2 36 (H) 22 - 32 mmol/L   Glucose, Bld 161 (H) 65 - 99 mg/dL   BUN 11 6 - 20 mg/dL   Creatinine, Ser 0.80 0.44 - 1.00 mg/dL   Calcium 8.9 8.9 - 10.3 mg/dL   Total Protein 7.6 6.5 - 8.1 g/dL   Albumin 3.1 (L) 3.5 - 5.0 g/dL   AST 26 15 - 41 U/L   ALT 21 14 - 54 U/L   Alkaline Phosphatase 129 (H) 38 - 126 U/L   Total Bilirubin 0.5 0.3 - 1.2 mg/dL   GFR calc non Af Amer >60 >60 mL/min   GFR calc Af Amer >60 >60 mL/min    Comment: (NOTE) The eGFR has been calculated  using the CKD EPI equation. This calculation has not been validated in all clinical situations. eGFR's persistently <60 mL/min signify possible Chronic Kidney Disease.    Anion gap 12 5 - 15  Troponin I     Status: None   Collection Time: 11/14/14  8:30 PM  Result Value Ref Range   Troponin I <0.03 <0.031 ng/mL    Comment:        NO INDICATION OF MYOCARDIAL INJURY.   Brain natriuretic peptide     Status: Abnormal   Collection Time: 11/14/14  8:30 PM  Result Value Ref Range   B Natriuretic Peptide 104.0 (H) 0.0 - 100.0 pg/mL  D-dimer, quantitative (not at [facility])     Status: Abnormal   Collection Time: 11/14/14  8:30 PM  Result Value Ref Range   D-Dimer, Quant 2.15 (H) 0.00 - 0.48 ug/mL-FEU    Comment:        AT THE INHOUSE ESTABLISHED CUTOFF VALUE OF 0.48 ug/mL FEU, THIS ASSAY HAS BEEN DOCUMENTED IN THE LITERATURE TO HAVE A SENSITIVITY AND NEGATIVE PREDICTIVE VALUE OF AT LEAST 98 TO 99%.  THE TEST RESULT SHOULD BE CORRELATED WITH AN ASSESSMENT OF THE CLINICAL PROBABILITY OF DVT / VTE.   Blood gas, arterial ([facility] ONLY)     Status: Abnormal   Collection Time: 11/14/14  8:45 PM  Result Value Ref Range   FIO2  %    CRITICAL RESULT CALLED TO, READ BACK BY AND VERIFIED WITH:    Comment: HAYMORE,R RN AT 2104 11/14/14 ANDERSON,S RRT   O2 Content 4.0 L/min   Delivery systems NASAL CANNULA    pH, Arterial 7.328 (L) 7.350 - 7.450   pCO2 arterial 74.4 (HH) 35.0 - 45.0 mmHg    Comment: HAYMORE RN 2104 11/14/14 ANDERSON,S RRT   pO2, Arterial 93.9 80.0 - 100.0 mmHg   Bicarbonate 38.0 (H) 20.0 - 24.0 mEq/L   TCO2 33.0 0 - 100 mmol/L   Acid-Base Excess 11.7 (H) 0.0 - 2.0 mmol/L   O2 Saturation 96.0 %   Collection site RIGHT RADIAL    Drawn by [id number]    Sample type ARTERIAL  Allens test (pass/fail) PASS PASS   Dg Chest Portable 1 View  11/14/2014   CLINICAL DATA:  59 year old female with shortness of breath  EXAM: PORTABLE CHEST - 1 VIEW  COMPARISON:  None.  FINDINGS:  Single-view of the chest demonstrate emphysematous changes with hyperinflation of the lungs. Bilateral lower lung field linear atelectasis/ scarring noted. There is blunting of the right costophrenic angles which may be related to atelectatic changes or a small pleural effusion. Mild cardiomegaly. The osseous structures are grossly unremarkable. Anterior cervical fixation plate and screws noted.  IMPRESSION: Emphysema with right lower lung field subsegmental atelectasis and possible small right pleural effusion. Superimposed pneumonia is not excluded. Clinical correlation and follow-up recommended stop.   Electronically Signed   By: Anner Crete M.D.   On: 11/14/2014 21:07    Review of Systems  Constitutional: Positive for fever. Negative for chills, weight loss, malaise/fatigue and diaphoresis.  HENT: Negative.   Eyes: Negative.   Respiratory: Positive for cough, shortness of breath and wheezing. Negative for hemoptysis and sputum production.   Cardiovascular: Negative.  Negative for chest pain, palpitations, orthopnea, claudication, leg swelling and PND.  Gastrointestinal: Negative.   Genitourinary: Negative.   Musculoskeletal: Negative.   Skin: Negative.   Neurological: Negative.  Negative for weakness.  Endo/Heme/Allergies: Negative.   Psychiatric/Behavioral: Negative.     Blood pressure 149/83, pulse 106, temperature 99.4 F (37.4 C), temperature source Oral, resp. rate 24, height 5' (1.524 m), weight 49.896 kg (110 lb), SpO2 100 %. Physical Exam  Constitutional: She is oriented to person, place, and time. She appears well-developed and well-nourished.  HENT:  Head: Normocephalic and atraumatic.  Mouth/Throat: No oropharyngeal exudate.  Eyes: Conjunctivae and EOM are normal. Pupils are equal, round, and reactive to light. No scleral icterus.  Neck: Normal range of motion. Neck supple. No JVD present. No tracheal deviation present. No thyromegaly present.  Cardiovascular: Exam  reveals no gallop and no friction rub.   No murmur heard. Tachycardic s1, s2  Respiratory: She is in respiratory distress. She has wheezes. She has no rales. She exhibits no tenderness.  GI: Soft. Bowel sounds are normal. She exhibits no distension. There is no tenderness. There is no rebound and no guarding.  Musculoskeletal: Normal range of motion. She exhibits no edema or tenderness.  Lymphadenopathy:    She has no cervical adenopathy.  Neurological: She is alert and oriented to person, place, and time. She has normal reflexes. She displays normal reflexes. No cranial nerve deficit. She exhibits normal muscle tone. Coordination normal.  Skin: Skin is warm and dry. No rash noted. No erythema. No pallor.  Psychiatric: She has a normal mood and affect. Her behavior is normal. Judgment and thought content normal.     Assessment/Plan Dyspnea, Acute respiratory failure with hypercapnea Secondary to Copd exacerbation ? Pneumonia Solumedrol 80mg  iv q8h vanco iv pharmacy to dose,  levaquin 500mg  iv qday spiriva 1puff qday Cont advair 250/50 1puff bid Albuterol neb 1 neb po q6h  And q6h prn Cont daliresp  Tachycardia Check Trop i q6hx 3 Check tsh D dimer is positive,  Awaiting VQ scan lovenox 1mg / kg [place] x1  Anemia Check cbc in am  Hyperglycemia Check hga1c  Anxiety Cont clonazepam  DVT prophylaxis:  Scd, lovenox  Larrie Lucia 11/14/2014, 10:40 PM

## 2014-11-14 NOTE — ED Provider Notes (Signed)
CSN: [id number]   Arrival date & time 11/14/14 2010  History  This chart was scribed for  Cynthia Essex, MD by Altamease Oiler, ED Scribe. This patient was seen in room [facility] and the patient's care was started at 8:18 PM.  Chief Complaint  Patient presents with  . Shortness of Breath    HPI The history is provided by the patient. No language interpreter was used.   Brought in by EMS on CPAP, [name] is a 59 y.o. female with PMHx of COPD who presents to the Emergency Department complaining of increasing SOB with onset 6 days ago. 4 L of oxygen and her home inhalers were not sufficient to relieve her symptoms. Associated symptoms include subjective fever, "bubbly" clear nasal discharge, "sore" abdominal pain,and myalgias.  Pt denies cough, chest pain, back pain, and LE swelling. At home she uses Bipap when laying down. She has never been hospitalized for her COPD. Pt denies cardiac history and history of PE. No recent travel.   Past Medical History  Diagnosis Date  . COPD (chronic obstructive pulmonary disease)     on home o2 4LNC  . Hypoglycemia   . SVT (supraventricular tachycardia)   . Tachycardia   . Complication of anesthesia     o2 sats dropped after back surgery  . Shortness of breath   . IBS (irritable bowel syndrome)   . Diverticulosis   . Hyperlipidemia     Past Surgical History  Procedure Laterality Date  . Spinal fusion    . Back surgery    . Colonoscopy  03/22/2011    Procedure: COLONOSCOPY;  Surgeon: Rogene Houston, MD;  Location: [facility];  Service: Endoscopy;  Laterality: N/A;  . Cardiac catheterization  07/05/2003    no significant CAD  . Nm myocar perf wall motion  08/19/2012    normal  . US echocardiography  09/14/2010    mild pulmonary artery hypertension    Family History  Problem Relation Age of Onset  . Asthma Mother   . Heart attack Mother   . Diabetes Mother   . Heart failure Mother     History  Substance Use Topics  . Smoking  status: Former Smoker -- 1.00 packs/day for 40 years    Types: Cigarettes    Quit date: 05/07/2011  . Smokeless tobacco: Never Used     Comment: presently smoking about 2 a day  . Alcohol Use: 0.0 oz/week    0 Standard drinks or equivalent per week     Comment: rare     Review of Systems  Constitutional: Positive for fever.  HENT:       Rhinorrhea  Respiratory: Negative for cough, sputum production and shortness of breath.   Cardiovascular: Negative for chest pain and leg swelling.  All other systems reviewed and are negative.   Home Medications   Prior to Admission medications   Medication Sig Start Date End Date Taking? Authorizing Provider  albuterol (PROAIR HFA) 108 (90 BASE) MCG/ACT inhaler Inhale 2 puffs into the lungs every 6 (six) hours as needed. For shortness of breath   Yes Historical Provider, MD  ALPRAZolam Duanne Moron) 0.5 MG tablet Take 0.5 mg by mouth daily as needed. anxiety   Yes Historical Provider, MD  aspirin 81 MG tablet Take 81 mg by mouth daily.   Yes Historical Provider, MD  atorvastatin (LIPITOR) 40 MG tablet Take 1 tablet (40 mg total) by mouth daily. 10/18/14  Yes Lorretta Harp, MD  cetirizine (  ZYRTEC) 10 MG tablet Take 10 mg by mouth daily.     Yes Historical Provider, MD  clonazePAM (KLONOPIN) 0.5 MG tablet Take 0.5 mg by mouth at bedtime. 11/01/14  Yes Historical Provider, MD  diltiazem (CARDIZEM CD) 180 MG 24 hr capsule TAKE 1 CAPSULE BY MOUTH DAILY 10/12/14  Yes Lorretta Harp, MD  escitalopram (LEXAPRO) 20 MG tablet Take 20 mg by mouth daily.     Yes Historical Provider, MD  Fluticasone-Salmeterol (ADVAIR) 250-50 MCG/DOSE AEPB Inhale 1 puff into the lungs 2 (two) times daily.    Yes Historical Provider, MD  ibuprofen (ADVIL,MOTRIN) 200 MG tablet Take 400 mg by mouth 2 (two) times daily as needed for mild pain or moderate pain.   Yes Historical Provider, MD  Multiple Vitamins-Minerals (MULTIVITAMINS THER. W/MINERALS) TABS Take 1 tablet by mouth daily.      Yes Historical Provider, MD  oxybutynin (DITROPAN) 5 MG tablet Take 5 mg by mouth daily.  08/28/13  Yes Historical Provider, MD  predniSONE (DELTASONE) 10 MG tablet 10 mg daily.  08/20/12  Yes Historical Provider, MD  roflumilast (DALIRESP) 500 MCG TABS tablet Take 500 mcg by mouth daily.     Yes Historical Provider, MD  SPIRIVA RESPIMAT 2.5 MCG/ACT AERS Inhale 1 puff into the lungs daily.  10/17/14  Yes Historical Provider, MD  triamcinolone (NASACORT ALLERGY 24HR) 55 MCG/ACT AERO nasal inhaler Place 2 sprays into the nose daily as needed (FOR ALLERGIES).   Yes Historical Provider, MD  fluconazole (DIFLUCAN) 150 MG tablet Take 150 mg by mouth once a week.  08/21/12   Historical Provider, MD    Allergies  Iohexol; Chantix; Ketek; and Penicillins  Triage Vitals: BP 149/83 mmHg  Pulse 118  Temp(Src) 99.4 F (37.4 C) (Oral)  Resp 24  Ht 5' (1.524 m)  Wt 110 lb (49.896 kg)  BMI 21.48 kg/m2  SpO2 94%  Physical Exam  Constitutional: She is oriented to person, place, and time. She appears well-developed and well-nourished. No distress.  On BIPAP sppeaking in full sentences   HENT:  Head: Normocephalic and atraumatic.  Mouth/Throat: Oropharynx is clear and moist. No oropharyngeal exudate.  Eyes: Conjunctivae and EOM are normal. Pupils are equal, round, and reactive to light.  Neck: Normal range of motion. Neck supple.  No meningismus.  Cardiovascular: Normal rate, regular rhythm, normal heart sounds and intact distal pulses.   No murmur heard. Pulmonary/Chest:  Diminished breath sounds bilaterally  Abdominal: Soft. There is tenderness. There is no rebound and no guarding.  Diffuse abdominal tenderness  Musculoskeletal: Normal range of motion. She exhibits no edema or tenderness.  No LE swelling  Neurological: She is alert and oriented to person, place, and time. No cranial nerve deficit. She exhibits normal muscle tone. Coordination normal.  No ataxia on finger to nose bilaterally. No  pronator drift. 5/5 strength throughout. CN 2-12 intact. Negative Romberg. Equal grip strength. Sensation intact.   Skin: Skin is warm.  Psychiatric: She has a normal mood and affect. Her behavior is normal.  Nursing note and vitals reviewed.   ED Course  Procedures   DIAGNOSTIC STUDIES: Oxygen Saturation is 94%% on CPAP.   COORDINATION OF CARE: 8:25 PM Discussed treatment plan which includes a Duoneb, Solu-medrol, EKG, CXRand labs with pt at bedside and pt agreed to plan.  9:41 PM I re-evaluated the patient, updated her on the results of her XR, and provided an update on the treatment plan including abx, VQ scan, and admission.   10:09  PM-Consult complete with Dr. Maudie Mercury. Patient case explained and discussed. Dr. Maudie Mercury agrees to admit patient for further evaluation and treatment. Call ended at 10:11 PM.   Labs Review-  Labs Reviewed  CBC WITH DIFFERENTIAL/PLATELET - Abnormal; Notable for the following:    WBC 24.0 (*)    RBC 3.64 (*)    Hemoglobin 11.9 (*)    MCV 101.4 (*)    Neutrophils Relative % 93 (*)    Neutro Abs 22.5 (*)    Lymphocytes Relative 2 (*)    Lymphs Abs 0.4 (*)    Monocytes Absolute 1.2 (*)    All other components within normal limits  COMPREHENSIVE METABOLIC PANEL - Abnormal; Notable for the following:    Chloride 93 (*)    CO2 36 (*)    Glucose, Bld 161 (*)    Albumin 3.1 (*)    Alkaline Phosphatase 129 (*)    All other components within normal limits  BRAIN NATRIURETIC PEPTIDE - Abnormal; Notable for the following:    B Natriuretic Peptide 104.0 (*)    All other components within normal limits  BLOOD GAS, ARTERIAL - Abnormal; Notable for the following:    pH, Arterial 7.328 (*)    pCO2 arterial 74.4 (*)    Bicarbonate 38.0 (*)    Acid-Base Excess 11.7 (*)    All other components within normal limits  D-DIMER, QUANTITATIVE (NOT AT [facility]) - Abnormal; Notable for the following:    D-Dimer, Quant 2.15 (*)    All other components within normal limits   TROPONIN I    Imaging Review Dg Chest Portable 1 View  11/14/2014   CLINICAL DATA:  59 year old female with shortness of breath  EXAM: PORTABLE CHEST - 1 VIEW  COMPARISON:  None.  FINDINGS: Single-view of the chest demonstrate emphysematous changes with hyperinflation of the lungs. Bilateral lower lung field linear atelectasis/ scarring noted. There is blunting of the right costophrenic angles which may be related to atelectatic changes or a small pleural effusion. Mild cardiomegaly. The osseous structures are grossly unremarkable. Anterior cervical fixation plate and screws noted.  IMPRESSION: Emphysema with right lower lung field subsegmental atelectasis and possible small right pleural effusion. Superimposed pneumonia is not excluded. Clinical correlation and follow-up recommended stop.   Electronically Signed   By: Anner Crete M.D.   On: 11/14/2014 21:07    EKG Interpretation  Date/Time:  Monday November 14 2014 20:17:25 EDT Ventricular Rate:  108 PR Interval:  83 QRS Duration: 81 QT Interval:  313 QTC Calculation: 419 R Axis:   93 Text Interpretation:  Sinus tachycardia Borderline right axis deviation Minimal ST depression, inferior leads No significant change was found Confirmed by Wyvonnia Dusky  MD, Aundrea Higginbotham [phone]) on 11/14/2014 8:58:13 PM       MDM   Final diagnoses:  SOB (shortness of breath)  Acute respiratory failure with hypercapnia  Right sided abdominal pain   SOB with coughing and wheezing.  Hx COPD on home O2.  No chest pain.  Cough is nonproductive.  bipap on arrival with nebs, steroids, magnesium.  Fair air exchange with scattered wheezing.  CXR with R base infiltrate.  Treat with levaquin. CO2 retention on ABG.  Continue Bipap.  Mental status normal. D-dimer obtained due to lack of wheezing.  Positive but patient allergic to IV contrast.  VQ ordered.  Admission d wDr. Maudie Mercury  CRITICAL CARE Performed by: [name] Total critical care time: 30 Critical care  time was exclusive of separately billable procedures and treating  other patients. Critical care was necessary to treat or prevent imminent or life-threatening deterioration. Critical care was time spent personally by me on the following activities: development of treatment plan with patient and/or surrogate as well as nursing, discussions with consultants, evaluation of patient's response to treatment, examination of patient, obtaining history from patient or surrogate, ordering and performing treatments and interventions, ordering and review of laboratory studies, ordering and review of radiographic studies, pulse oximetry and re-evaluation of patient's condition.    Cynthia Essex, MD 11/15/14 [phone]

## 2014-11-14 NOTE — ED Notes (Signed)
CRITICAL VALUE ALERT  Critical value received:  CO2 74.4  Date of notification:  11/14/14  Time of notification:  2105  Critical value read back:Yes.    Nurse who received alert Derek Mound, RN  MD notified (1st page):  Rancour  Time of first page:  2106  MD notified (2nd page):  Time of second page:  Responding MD:  Rancour  Time MD responded:  2106

## 2014-11-14 NOTE — ED Notes (Signed)
Pt c/o increased sob x one week. Pt placed on cpap by ems. Pts o2 sat on arrival was 92

## 2014-11-15 ENCOUNTER — Inpatient Hospital Stay (HOSPITAL_COMMUNITY): Payer: Medicare Other

## 2014-11-15 ENCOUNTER — Encounter (HOSPITAL_COMMUNITY): Payer: Self-pay

## 2014-11-15 LAB — APTT: aPTT: 38 seconds — ABNORMAL HIGH (ref 24–37)

## 2014-11-15 LAB — CBC WITH DIFFERENTIAL/PLATELET
BASOS ABS: 0 10*3/uL (ref 0.0–0.1)
Basophils Relative: 0 % (ref 0–1)
EOS ABS: 0 10*3/uL (ref 0.0–0.7)
Eosinophils Relative: 0 % (ref 0–5)
HEMATOCRIT: 32.5 % — AB (ref 36.0–46.0)
Hemoglobin: 10.4 g/dL — ABNORMAL LOW (ref 12.0–15.0)
Lymphocytes Relative: 1 % — ABNORMAL LOW (ref 12–46)
Lymphs Abs: 0.3 10*3/uL — ABNORMAL LOW (ref 0.7–4.0)
MCH: 32.6 pg (ref 26.0–34.0)
MCHC: 32 g/dL (ref 30.0–36.0)
MCV: 101.9 fL — AB (ref 78.0–100.0)
MONO ABS: 0.2 10*3/uL (ref 0.1–1.0)
MONOS PCT: 1 % — AB (ref 3–12)
Neutro Abs: 19.4 10*3/uL — ABNORMAL HIGH (ref 1.7–7.7)
Neutrophils Relative %: 98 % — ABNORMAL HIGH (ref 43–77)
Platelets: 299 10*3/uL (ref 150–400)
RBC: 3.19 MIL/uL — ABNORMAL LOW (ref 3.87–5.11)
RDW: 13 % (ref 11.5–15.5)
WBC: 19.8 10*3/uL — AB (ref 4.0–10.5)

## 2014-11-15 LAB — PROTIME-INR
INR: 1.21 (ref 0.00–1.49)
Prothrombin Time: 15.5 seconds — ABNORMAL HIGH (ref 11.6–15.2)

## 2014-11-15 LAB — COMPREHENSIVE METABOLIC PANEL
ALBUMIN: 2.6 g/dL — AB (ref 3.5–5.0)
ALT: 19 U/L (ref 14–54)
ANION GAP: 9 (ref 5–15)
AST: 19 U/L (ref 15–41)
Alkaline Phosphatase: 110 U/L (ref 38–126)
BILIRUBIN TOTAL: 0.4 mg/dL (ref 0.3–1.2)
BUN: 11 mg/dL (ref 6–20)
CALCIUM: 8.3 mg/dL — AB (ref 8.9–10.3)
CHLORIDE: 96 mmol/L — AB (ref 101–111)
CO2: 34 mmol/L — AB (ref 22–32)
CREATININE: 0.64 mg/dL (ref 0.44–1.00)
GFR calc non Af Amer: >60 mL/min (ref >60)
Glucose, Bld: 142 mg/dL — ABNORMAL HIGH (ref 65–99)
Potassium: 4.1 mmol/L (ref 3.5–5.1)
Sodium: 139 mmol/L (ref 135–145)
Total Protein: 6.6 g/dL (ref 6.5–8.1)

## 2014-11-15 LAB — STREP PNEUMONIAE URINARY ANTIGEN: STREP PNEUMO URINARY ANTIGEN: POSITIVE — AB

## 2014-11-15 LAB — TROPONIN I: Troponin I: <0.03 ng/mL (ref <0.031)

## 2014-11-15 LAB — TSH: TSH: 1.116 u[IU]/mL (ref 0.350–4.500)

## 2014-11-15 LAB — MRSA PCR SCREENING: MRSA by PCR: NEGATIVE

## 2014-11-15 MED ORDER — SODIUM CHLORIDE 0.9 % IJ SOLN
3.0000 mL | Freq: Two times a day (BID) | INTRAMUSCULAR | Status: DC
  Administered 2014-11-15 – 2014-11-18 (×4): 3 mL via INTRAVENOUS

## 2014-11-15 MED ORDER — TECHNETIUM TO 99M ALBUMIN AGGREGATED
6.0000 | Freq: Once | INTRAVENOUS | Status: AC | PRN
  Administered 2014-11-15: 6 via INTRAVENOUS

## 2014-11-15 MED ORDER — VANCOMYCIN HCL IN DEXTROSE 1-5 GM/200ML-% IV SOLN
1000.0000 mg | Freq: Once | INTRAVENOUS | Status: AC
  Administered 2014-11-15: 1000 mg via INTRAVENOUS
  Filled 2014-11-15: qty 200

## 2014-11-15 MED ORDER — ALBUTEROL SULFATE HFA 108 (90 BASE) MCG/ACT IN AERS
2.0000 | INHALATION_SPRAY | Freq: Four times a day (QID) | RESPIRATORY_TRACT | Status: DC | PRN
  Filled 2014-11-15: qty 6.7

## 2014-11-15 MED ORDER — ESCITALOPRAM OXALATE 10 MG PO TABS
20.0000 mg | ORAL_TABLET | Freq: Every day | ORAL | Status: DC
  Administered 2014-11-15 – 2014-11-19 (×5): 20 mg via ORAL
  Filled 2014-11-15: qty 2
  Filled 2014-11-15 (×2): qty 1
  Filled 2014-11-15 (×4): qty 2

## 2014-11-15 MED ORDER — OXYBUTYNIN CHLORIDE 5 MG PO TABS
5.0000 mg | ORAL_TABLET | Freq: Every day | ORAL | Status: DC
  Administered 2014-11-15 – 2014-11-19 (×5): 5 mg via ORAL
  Filled 2014-11-15 (×5): qty 1

## 2014-11-15 MED ORDER — ALBUTEROL SULFATE (2.5 MG/3ML) 0.083% IN NEBU: 2.5000 mg | INHALATION_SOLUTION | Freq: Four times a day (QID) | RESPIRATORY_TRACT | Status: DC | PRN

## 2014-11-15 MED ORDER — ASPIRIN 81 MG PO CHEW
81.0000 mg | CHEWABLE_TABLET | Freq: Every day | ORAL | Status: DC
  Administered 2014-11-15 – 2014-11-19 (×5): 81 mg via ORAL
  Filled 2014-11-15 (×5): qty 1

## 2014-11-15 MED ORDER — ACETAMINOPHEN 650 MG RE SUPP: 650.0000 mg | Freq: Four times a day (QID) | RECTAL | Status: DC | PRN

## 2014-11-15 MED ORDER — CETYLPYRIDINIUM CHLORIDE 0.05 % MT LIQD
7.0000 mL | Freq: Two times a day (BID) | OROMUCOSAL | Status: DC
  Administered 2014-11-15 (×2): 7 mL via OROMUCOSAL

## 2014-11-15 MED ORDER — ACETAMINOPHEN 325 MG PO TABS
650.0000 mg | ORAL_TABLET | Freq: Four times a day (QID) | ORAL | Status: DC | PRN
  Administered 2014-11-16 – 2014-11-18 (×4): 650 mg via ORAL
  Filled 2014-11-15 (×4): qty 2

## 2014-11-15 MED ORDER — LEVOFLOXACIN IN D5W 750 MG/150ML IV SOLN
750.0000 mg | INTRAVENOUS | Status: DC
  Administered 2014-11-15 – 2014-11-17 (×3): 750 mg via INTRAVENOUS
  Filled 2014-11-15 (×3): qty 150

## 2014-11-15 MED ORDER — ATORVASTATIN CALCIUM 40 MG PO TABS
40.0000 mg | ORAL_TABLET | Freq: Every day | ORAL | Status: DC
  Administered 2014-11-15 – 2014-11-19 (×5): 40 mg via ORAL
  Filled 2014-11-15 (×5): qty 1

## 2014-11-15 MED ORDER — VANCOMYCIN HCL 500 MG IV SOLR
500.0000 mg | Freq: Two times a day (BID) | INTRAVENOUS | Status: DC
  Administered 2014-11-15 – 2014-11-16 (×3): 500 mg via INTRAVENOUS
  Filled 2014-11-15 (×5): qty 500

## 2014-11-15 MED ORDER — LEVOFLOXACIN IN D5W 500 MG/100ML IV SOLN: 500.0000 mg | INTRAVENOUS | Status: DC

## 2014-11-15 MED ORDER — MOMETASONE FURO-FORMOTEROL FUM 100-5 MCG/ACT IN AERO
2.0000 | INHALATION_SPRAY | Freq: Two times a day (BID) | RESPIRATORY_TRACT | Status: DC
  Administered 2014-11-15 – 2014-11-16 (×2): 2 via RESPIRATORY_TRACT
  Filled 2014-11-15: qty 8.8

## 2014-11-15 MED ORDER — LEVOFLOXACIN IN D5W 500 MG/100ML IV SOLN
500.0000 mg | INTRAVENOUS | Status: DC
  Filled 2014-11-15: qty 100

## 2014-11-15 MED ORDER — ENOXAPARIN SODIUM 40 MG/0.4ML ~~LOC~~ SOLN
40.0000 mg | SUBCUTANEOUS | Status: DC
  Administered 2014-11-16 – 2014-11-19 (×4): 40 mg via SUBCUTANEOUS
  Filled 2014-11-15 (×4): qty 0.4

## 2014-11-15 MED ORDER — VANCOMYCIN HCL IN DEXTROSE 1-5 GM/200ML-% IV SOLN
INTRAVENOUS | Status: AC
  Filled 2014-11-15: qty 200

## 2014-11-15 MED ORDER — SODIUM CHLORIDE 0.9 % IJ SOLN
3.0000 mL | Freq: Two times a day (BID) | INTRAMUSCULAR | Status: DC
  Administered 2014-11-15 – 2014-11-18 (×6): 3 mL via INTRAVENOUS

## 2014-11-15 MED ORDER — ENOXAPARIN SODIUM 60 MG/0.6ML ~~LOC~~ SOLN
1.0000 mg/kg | Freq: Once | SUBCUTANEOUS | Status: AC
  Administered 2014-11-15: 50 mg via SUBCUTANEOUS
  Filled 2014-11-15: qty 0.6

## 2014-11-15 MED ORDER — CLONAZEPAM 0.5 MG PO TABS
0.5000 mg | ORAL_TABLET | Freq: Every day | ORAL | Status: DC
  Administered 2014-11-15 – 2014-11-18 (×5): 0.5 mg via ORAL
  Filled 2014-11-15 (×5): qty 1

## 2014-11-15 MED ORDER — FLUCONAZOLE 100MG IVPB
100.0000 mg | INTRAVENOUS | Status: DC
  Administered 2014-11-15 – 2014-11-18 (×4): 100 mg via INTRAVENOUS
  Filled 2014-11-15 (×6): qty 50

## 2014-11-15 MED ORDER — DILTIAZEM HCL ER COATED BEADS 180 MG PO CP24
180.0000 mg | ORAL_CAPSULE | Freq: Every day | ORAL | Status: DC
  Administered 2014-11-15 – 2014-11-19 (×5): 180 mg via ORAL
  Filled 2014-11-15 (×5): qty 1

## 2014-11-15 MED ORDER — SODIUM CHLORIDE 0.9 % IV SOLN: 250.0000 mL | INTRAVENOUS | Status: DC | PRN

## 2014-11-15 MED ORDER — LORATADINE 10 MG PO TABS
10.0000 mg | ORAL_TABLET | Freq: Every day | ORAL | Status: DC
  Administered 2014-11-15 – 2014-11-19 (×5): 10 mg via ORAL
  Filled 2014-11-15 (×5): qty 1

## 2014-11-15 MED ORDER — LEVALBUTEROL HCL 1.25 MG/0.5ML IN NEBU
1.2500 mg | INHALATION_SOLUTION | Freq: Four times a day (QID) | RESPIRATORY_TRACT | Status: DC
Start: 2014-11-15 — End: 2014-11-16
  Administered 2014-11-15 (×3): 1.25 mg via RESPIRATORY_TRACT
  Filled 2014-11-15 (×3): qty 0.5

## 2014-11-15 MED ORDER — TECHNETIUM TC 99M DIETHYLENETRIAME-PENTAACETIC ACID
40.0000 | Freq: Once | INTRAVENOUS | Status: AC | PRN
  Administered 2014-11-15: 38.9 via RESPIRATORY_TRACT

## 2014-11-15 MED ORDER — ROFLUMILAST 500 MCG PO TABS
500.0000 ug | ORAL_TABLET | Freq: Every day | ORAL | Status: DC
  Administered 2014-11-15 – 2014-11-19 (×5): 500 ug via ORAL
  Filled 2014-11-15 (×5): qty 1

## 2014-11-15 MED ORDER — METHYLPREDNISOLONE SODIUM SUCC 125 MG IJ SOLR
80.0000 mg | Freq: Three times a day (TID) | INTRAMUSCULAR | Status: DC
  Filled 2014-11-15: qty 2

## 2014-11-15 MED ORDER — FLUTICASONE PROPIONATE 50 MCG/ACT NA SUSP
2.0000 | Freq: Every day | NASAL | Status: DC | PRN
Start: 2014-11-15 — End: 2014-11-19

## 2014-11-15 MED ORDER — TIOTROPIUM BROMIDE MONOHYDRATE 18 MCG IN CAPS
1.0000 | ORAL_CAPSULE | Freq: Every day | RESPIRATORY_TRACT | Status: DC
  Administered 2014-11-15 – 2014-11-16 (×2): 18 ug via RESPIRATORY_TRACT
  Filled 2014-11-15: qty 5

## 2014-11-15 MED ORDER — ADULT MULTIVITAMIN W/MINERALS CH
1.0000 | ORAL_TABLET | Freq: Every day | ORAL | Status: DC
  Administered 2014-11-15 – 2014-11-19 (×5): 1 via ORAL
  Filled 2014-11-15 (×5): qty 1

## 2014-11-15 MED ORDER — CHLORHEXIDINE GLUCONATE 0.12 % MT SOLN
15.0000 mL | Freq: Two times a day (BID) | OROMUCOSAL | Status: DC
  Administered 2014-11-15: 15 mL via OROMUCOSAL
  Filled 2014-11-15: qty 15

## 2014-11-15 MED ORDER — ALPRAZOLAM 0.5 MG PO TABS
0.5000 mg | ORAL_TABLET | Freq: Every day | ORAL | Status: DC | PRN
  Administered 2014-11-15 – 2014-11-18 (×4): 0.5 mg via ORAL
  Filled 2014-11-15 (×4): qty 1

## 2014-11-15 MED ORDER — METHYLPREDNISOLONE SODIUM SUCC 125 MG IJ SOLR
80.0000 mg | Freq: Three times a day (TID) | INTRAMUSCULAR | Status: DC
  Administered 2014-11-15 – 2014-11-18 (×11): 80 mg via INTRAVENOUS
  Filled 2014-11-15 (×10): qty 2

## 2014-11-15 NOTE — Progress Notes (Signed)
[facility] PROGRESS NOTE  [name] ZOX:[id number] DOB: Dec 25, 1955    PCP:   Cassell Smiles., MD   HPI: [name] is an 59 y.o. female with hx of severe COPD, prior smoker, on 4L [place] home oxygen, hx of CAD, hx of SVT, HLD, admitted for right sided CP and increase SOB over the past week.  She has anaphylaxis to IV dye, and was given one dose of Lovenox 1mg  per kg pending V/Q scan this am.  She was found to have leukocytosis, but has been on oral steroid PTA.  She was given IV antibiotics, IV steroids, and neb.  Her ABG 7.33/74/pOx=94 on 4 L.  Her bicarb was 35, suggestive she is a chronic CO2 retainer. Bipap came off today.   Rewiew of Systems:  Constitutional: Negative for malaise, fever and chills. No significant weight loss or weight gain Eyes: Negative for eye pain, redness and discharge, diplopia, visual changes, or flashes of light. ENMT: Negative for ear pain, hoarseness, nasal congestion, sinus pressure and sore throat. No headaches; tinnitus, drooling, or problem swallowing. Cardiovascular: Negative for chest pain, palpitations, diaphoresis, dyspnea and peripheral edema. ; No orthopnea, PND Respiratory: Negative for cough, hemoptysis, wheezing and stridor. No pleuritic chestpain. Gastrointestinal: Negative for nausea, vomiting, diarrhea, constipation, abdominal pain, melena, blood in stool, hematemesis, jaundice and rectal bleeding.    Genitourinary: Negative for frequency, dysuria, incontinence,flank pain and hematuria; Musculoskeletal: Negative for back pain and neck pain. Negative for swelling and trauma.;  Skin: . Negative for pruritus, rash, abrasions, bruising and skin lesion.; ulcerations Neuro: Negative for headache, lightheadedness and neck stiffness. Negative for weakness, altered level of consciousness , altered mental status, extremity weakness, burning feet, involuntary movement, seizure and syncope.  Psych: negative for anxiety, depression, insomnia,  tearfulness, panic attacks, hallucinations, paranoia, suicidal or homicidal ideation    Past Medical History  Diagnosis Date  . COPD (chronic obstructive pulmonary disease)     on home o2 4LNC  . Hypoglycemia   . SVT (supraventricular tachycardia)   . Tachycardia   . Complication of anesthesia     o2 sats dropped after back surgery  . Shortness of breath   . IBS (irritable bowel syndrome)   . Diverticulosis   . Hyperlipidemia     Past Surgical History  Procedure Laterality Date  . Spinal fusion    . Back surgery    . Colonoscopy  03/22/2011    Procedure: COLONOSCOPY;  Surgeon: Malissa Hippo, MD;  Location: [facility];  Service: Endoscopy;  Laterality: N/A;  . Cardiac catheterization  07/05/2003    no significant CAD  . Nm myocar perf wall motion  08/19/2012    normal  . US echocardiography  09/14/2010    mild pulmonary artery hypertension    Medications:  HOME MEDS: Prior to Admission medications   Medication Sig Start Date End Date Taking? Authorizing Provider  albuterol (PROAIR HFA) 108 (90 BASE) MCG/ACT inhaler Inhale 2 puffs into the lungs every 6 (six) hours as needed. For shortness of breath   Yes Historical Provider, MD  ALPRAZolam Prudy Feeler) 0.5 MG tablet Take 0.5 mg by mouth daily as needed. anxiety   Yes Historical Provider, MD  aspirin 81 MG tablet Take 81 mg by mouth daily.   Yes Historical Provider, MD  atorvastatin (LIPITOR) 40 MG tablet Take 1 tablet (40 mg total) by mouth daily. 10/18/14  Yes Runell Gess, MD  cetirizine (ZYRTEC) 10 MG tablet Take 10 mg by mouth daily.  Yes Historical Provider, MD  clonazePAM (KLONOPIN) 0.5 MG tablet Take 0.5 mg by mouth at bedtime. 11/01/14  Yes Historical Provider, MD  diltiazem (CARDIZEM CD) 180 MG 24 hr capsule TAKE 1 CAPSULE BY MOUTH DAILY 10/12/14  Yes Runell Gess, MD  escitalopram (LEXAPRO) 20 MG tablet Take 20 mg by mouth daily.     Yes Historical Provider, MD  Fluticasone-Salmeterol (ADVAIR) 250-50 MCG/DOSE  AEPB Inhale 1 puff into the lungs 2 (two) times daily.    Yes Historical Provider, MD  ibuprofen (ADVIL,MOTRIN) 200 MG tablet Take 400 mg by mouth 2 (two) times daily as needed for mild pain or moderate pain.   Yes Historical Provider, MD  Multiple Vitamins-Minerals (MULTIVITAMINS THER. W/MINERALS) TABS Take 1 tablet by mouth daily.     Yes Historical Provider, MD  oxybutynin (DITROPAN) 5 MG tablet Take 5 mg by mouth daily.  08/28/13  Yes Historical Provider, MD  predniSONE (DELTASONE) 10 MG tablet 10 mg daily.  08/20/12  Yes Historical Provider, MD  roflumilast (DALIRESP) 500 MCG TABS tablet Take 500 mcg by mouth daily.     Yes Historical Provider, MD  SPIRIVA RESPIMAT 2.5 MCG/ACT AERS Inhale 1 puff into the lungs daily.  10/17/14  Yes Historical Provider, MD  triamcinolone (NASACORT ALLERGY 24HR) 55 MCG/ACT AERO nasal inhaler Place 2 sprays into the nose daily as needed (FOR ALLERGIES).   Yes Historical Provider, MD  fluconazole (DIFLUCAN) 150 MG tablet Take 150 mg by mouth once a week.  08/21/12   Historical Provider, MD     Allergies:  Allergies  Allergen Reactions  . Iohexol Anaphylaxis     Desc: IV DYE  ANAPHYLATIC SHOCK X2 ONCE AFTER PREMEDS   . Chantix [Varenicline] Other (See Comments)    PALPITATIONS  . Ketek [Telithromycin]   . Penicillins Other (See Comments)    unknown    Social History:   reports that she quit smoking about 3 years ago. Her smoking use included Cigarettes. She has a 40 pack-year smoking history. She has never used smokeless tobacco. She reports that she drinks alcohol. She reports that she does not use illicit drugs.  Family History: Family History  Problem Relation Age of Onset  . Asthma Mother   . Heart attack Mother   . Diabetes Mother   . Heart failure Mother      Physical Exam: Filed Vitals:   11/15/14 0600 11/15/14 0700 11/15/14 0800 11/15/14 0814  BP: 103/58 107/64 109/79   Pulse: 83 76 90   Temp:    97.7 F (36.5 C)  TempSrc:    Oral   Resp: 20 24 22    Height:      Weight:      SpO2: 95% 95% 94%    Blood pressure 109/79, pulse 90, temperature 97.7 F (36.5 C), temperature source Oral, resp. rate 22, height 5\' 3"  (1.6 m), weight 49.8 kg (109 lb 12.6 oz), SpO2 94 %.  GEN:  Pleasant patient lying in the stretcher in no acute distress; cooperative with exam. PSYCH:  alert and oriented x4; does not appear anxious or depressed; affect is appropriate. HEENT: Mucous membranes pink and anicteric; PERRLA; EOM intact; no cervical lymphadenopathy nor thyromegaly or carotid bruit; no JVD; There were no stridor. Neck is very supple. Breasts:: Not examined CHEST WALL: No tenderness CHEST: Normal respiration, clear to auscultation bilaterally. Shallow BS, but no wheezing or rales.  HEART: Regular rate and rhythm.  There are no murmur, rub, or gallops.   BACK: No  kyphosis or scoliosis; no CVA tenderness ABDOMEN: soft and non-tender; no masses, no organomegaly, normal abdominal bowel sounds; no pannus; no intertriginous candida. There is no rebound and no distention. Rectal Exam: Not done EXTREMITIES: No bone or joint deformity; age-appropriate arthropathy of the hands and knees; no edema; no ulcerations.  There is no calf tenderness. Genitalia: not examined PULSES: 2+ and symmetric SKIN: Normal hydration no rash or ulceration CNS: Cranial nerves 2-12 grossly intact no focal lateralizing neurologic deficit.  Speech is fluent; uvula elevated with phonation, facial symmetry and tongue midline. DTR are normal bilaterally, cerebella exam is intact, barbinski is negative and strengths are equaled bilaterally.  No sensory loss.   Labs on Admission:  Basic Metabolic Panel:  Recent Labs Lab 11/14/14 2030 11/15/14 0540  NA 141 139  K 3.9 4.1  CL 93* 96*  CO2 36* 34*  GLUCOSE 161* 142*  BUN 11 11  CREATININE 0.80 0.64  CALCIUM 8.9 8.3*   Liver Function Tests:  Recent Labs Lab 11/14/14 2030 11/15/14 0540  AST 26 19  ALT 21 19   ALKPHOS 129* 110  BILITOT 0.5 0.4  PROT 7.6 6.6  ALBUMIN 3.1* 2.6*     Recent Labs Lab 11/14/14 2030 11/15/14 0540  WBC 24.0* 19.8*  NEUTROABS 22.5* 19.4*  HGB 11.9* 10.4*  HCT 36.9 32.5*  MCV 101.4* 101.9*  PLT 320 299   Cardiac Enzymes:  Recent Labs Lab 11/14/14 2030 11/15/14 0037 11/15/14 0540  TROPONINI <0.03 <0.03 <0.03    CBG: No results for input(s): GLUCAP in the last 168 hours.   Radiological Exams on Admission: Ct Abdomen Pelvis Wo Contrast  11/15/2014   CLINICAL DATA:  59 year old female with COPD presenting with dyspnea fever and cough  EXAM: CT ABDOMEN AND PELVIS WITHOUT CONTRAST  TECHNIQUE: Multidetector CT imaging of the abdomen and pelvis was performed following the standard protocol without IV contrast.  COMPARISON:  CT dated 04/08/2013  FINDINGS: Evaluation of this exam is limited in the absence of intravenous contrast.  There is emphysematous changes of the right lung base with an area of consolidation and air bronchograms compatible with pneumonia.  No intra-abdominal free air or free fluid.  The liver, gallbladder, pancreas, spleen, adrenal glands, kidneys, visualized ureters, and urinary bladder appear unremarkable. A punctate calcific density at the base of the bladder (series 2, image 62 and sagittal image 49) may represent a urethral calculus or focal calcification of the bladder floor.  The uterus is grossly unremarkable.  There is sigmoid diverticulosis with muscular hypertrophy. No active inflammation. Constipation. No bowel obstruction. Normal appendix.  There is aortoiliac atherosclerotic disease. The small fat containing umbilical hernia for degenerative changes of the spine. The  IMPRESSION: Large right lung base consolidation and air bronchogram compatible with pneumonia. Clinical correlation and follow-up to resolution recommended.  Punctate calcific density at the floor of the bladder may represent a urethral calculus versus focal calcification  of the bladder floor. There is no hydronephrosis.  Constipation. No evidence of bowel obstruction or inflammation. Normal appendix.  Sigmoid diverticulosis.   Electronically Signed   By: Elgie Collard M.D.   On: 11/15/2014 00:46   Dg Chest Portable 1 View  11/14/2014   CLINICAL DATA:  59 year old female with shortness of breath  EXAM: PORTABLE CHEST - 1 VIEW  COMPARISON:  None.  FINDINGS: Single-view of the chest demonstrate emphysematous changes with hyperinflation of the lungs. Bilateral lower lung field linear atelectasis/ scarring noted. There is blunting of the right costophrenic angles  which may be related to atelectatic changes or a small pleural effusion. Mild cardiomegaly. The osseous structures are grossly unremarkable. Anterior cervical fixation plate and screws noted.  IMPRESSION: Emphysema with right lower lung field subsegmental atelectasis and possible small right pleural effusion. Superimposed pneumonia is not excluded. Clinical correlation and follow-up recommended stop.   Electronically Signed   By: Elgie Collard M.D.   On: 11/14/2014 21:07   Assessment/Plan Present on Admission:  . Respiratory failure with hypercapnia . Acute respiratory failure with hypercapnia  PLAN:  Continue with IV steroids, antibiotics, and nebs.  Consider Diamox for chronic CO2 retainer.  Keep in ICU today.  Proceed with V/Q scan and adjust anticoagulation.  Check Mag and continue current therapy.   Other plans as per orders.  Code Status: FULL Unk Lightning, MD. [facility] Pager [phone] 7pm to 7am.  11/15/2014, 9:34 AM

## 2014-11-15 NOTE — Progress Notes (Signed)
ANTIBIOTIC CONSULT NOTE-Preliminary  Pharmacy Consult for Vancomycin Indication: Pneumonia  Allergies  Allergen Reactions  . Iohexol Anaphylaxis     Desc: IV DYE  ANAPHYLATIC SHOCK X2 ONCE AFTER PREMEDS   . Chantix [Varenicline] Other (See Comments)    PALPITATIONS  . Ketek [Telithromycin]   . Penicillins Other (See Comments)    unknown    Patient Measurements: Height: 5' (152.4 cm) Weight: 110 lb (49.896 kg) IBW/kg (Calculated) : 45.5  Vital Signs: Temp: 99.4 F (37.4 C) (07/11 2015) Temp Source: Oral (07/11 2015) BP: 109/78 mmHg (07/11 2300) Pulse Rate: 111 (07/11 2300)  Labs:  Recent Labs  11/14/14 2030  WBC 24.0*  HGB 11.9*  PLT 320  CREATININE 0.80    Estimated Creatinine Clearance: 55.7 mL/min (by C-G formula based on Cr of 0.8).  No results for input(s): VANCOTROUGH, VANCOPEAK, VANCORANDOM, GENTTROUGH, GENTPEAK, GENTRANDOM, TOBRATROUGH, TOBRAPEAK, TOBRARND, AMIKACINPEAK, AMIKACINTROU, AMIKACIN in the last 72 hours.   Microbiology: No results found for this or any previous visit (from the past 720 hour(s)).  Medical History: Past Medical History  Diagnosis Date  . COPD (chronic obstructive pulmonary disease)     on home o2 4LNC  . Hypoglycemia   . SVT (supraventricular tachycardia)   . Tachycardia   . Complication of anesthesia     o2 sats dropped after back surgery  . Shortness of breath   . IBS (irritable bowel syndrome)   . Diverticulosis   . Hyperlipidemia     Medications:  Levofloxacin 500 mg IV x 1 dose in the ED at 2200 11/14/14  Assessment: 59 yo female with hx of COPD seen in the ED for increased SOB x 1 week. Pt with fever, cough and leukocytosis. VQ scan pending. Pneumonia superimposed on emphysematous changes cannot be ruled out. Empiric antibiotic coverage on admission.  Goal of Therapy:  Vancomycin troughs 15-20 mcg/ml  Plan:  Preliminary review of pertinent patient information completed.  Protocol will be initiated with a  one-time dose of Vancomycin 1000 mg IV.  Forestine Na clinical pharmacist will complete review during morning rounds to assess patient and finalize treatment regimen.  Norberto Sorenson, [facility] 11/15/2014,12:59 AM

## 2014-11-15 NOTE — Care Management Note (Signed)
Case Management Note  Patient Details  Name: [name] MRN: [id number] Date of Birth: 08/08/1955  Expected Discharge Date:                  Expected Discharge Plan:  [place]  In-House Referral:  NA  Discharge planning Services  CM Consult  Post Acute Care Choice:  Durable Medical Equipment Choice offered to:  Patient  DME Arranged:  3-N-1, Walker rolling DME Agency:  [place]:    [facility] Agency:     Status of Service:  In process, will continue to follow  Medicare Important Message Given:    Date Medicare IM Given:    Medicare IM give by:    Date Additional Medicare IM Given:    Additional Medicare Important Message give by:     If discussed at [place] of Stay Meetings, dates discussed:    Additional Comments: Pt is from home, lives alone and is independent at  Baseline. Pt has home O2, neb machine and BIPAP at home. Pt says she is able to ambulate short distances and uses a grocery cart in the home to walk with. Pt will be ordered a 3 in 1 and rollator. Pt would like DME from [facility] Ernestina Columbia, from [facility] made aware of DME needs and will obtain pt info from chart. Pt's rollator will be delivered to home and pt's 3 in 1 will be delivered to pt's home. CM anticipates pt may need HH RN for disease management. Will cont to follow for pt progression and DC planning.  Sherald Barge, RN 11/15/2014, 3:03 PM

## 2014-11-15 NOTE — Progress Notes (Signed)
ANTIBIOTIC CONSULT NOTE - FOLLOW UP  Pharmacy Consult for Vancomycin, Renal Adjustment Indication: rule out pneumonia  Allergies  Allergen Reactions  . Iohexol Anaphylaxis     Desc: IV DYE  ANAPHYLATIC SHOCK X2 ONCE AFTER PREMEDS   . Chantix [Varenicline] Other (See Comments)    PALPITATIONS  . Ketek [Telithromycin]   . Penicillins Other (See Comments)    unknown   Patient Measurements: Height: 5\' 3"  (160 cm) Weight: 109 lb 12.6 oz (49.8 kg) IBW/kg (Calculated) : 52.4  Vital Signs: Temp: 97.7 F (36.5 C) (07/12 0814) Temp Source: Oral (07/12 0814) BP: 109/79 mmHg (07/12 0800) Pulse Rate: 90 (07/12 0800) Intake/Output from previous day: 07/11 0701 - 07/12 0700 In: 250 [I.V.:250] Out: -  Intake/Output from this shift: Total I/O In: 580 [P.O.:480; I.V.:100] Out: -   Labs:  Recent Labs  11/14/14 2030 11/15/14 0540  WBC 24.0* 19.8*  HGB 11.9* 10.4*  PLT 320 299  CREATININE 0.80 0.64   Estimated Creatinine Clearance: 61 mL/min (by C-G formula based on Cr of 0.64). No results for input(s): VANCOTROUGH, VANCOPEAK, VANCORANDOM, GENTTROUGH, GENTPEAK, GENTRANDOM, TOBRATROUGH, TOBRAPEAK, TOBRARND, AMIKACINPEAK, AMIKACINTROU, AMIKACIN in the last 72 hours.   Microbiology: Recent Results (from the past 720 hour(s))  Culture, blood (routine x 2) Call MD if unable to obtain prior to antibiotics being given     Status: None (Preliminary result)   Collection Time: 11/15/14 12:00 AM  Result Value Ref Range Status   Specimen Description BLOOD RIGHT ANTECUBITAL  Final   Special Requests BOTTLES DRAWN AEROBIC AND ANAEROBIC 10CC EACH  Final   Culture PENDING  Incomplete   Report Status PENDING  Incomplete  Culture, blood (routine x 2) Call MD if unable to obtain prior to antibiotics being given     Status: None (Preliminary result)   Collection Time: 11/15/14 12:37 AM  Result Value Ref Range Status   Specimen Description BLOOD RIGHT ANTECUBITAL  Final   Special Requests  BOTTLES DRAWN AEROBIC AND ANAEROBIC 10CC EACH  Final   Culture PENDING  Incomplete   Report Status PENDING  Incomplete  MRSA PCR Screening     Status: None   Collection Time: 11/15/14  1:30 AM  Result Value Ref Range Status   MRSA by PCR NEGATIVE NEGATIVE Final    Comment:        The GeneXpert MRSA Assay (FDA approved for NASAL specimens only), is one component of a comprehensive MRSA colonization surveillance program. It is not intended to diagnose MRSA infection nor to guide or monitor treatment for MRSA infections.    Anti-infectives    Start     Dose/Rate Route Frequency Ordered Stop   11/15/14 2200  levofloxacin (LEVAQUIN) IVPB 500 mg     500 mg 100 mL/hr over 60 Minutes Intravenous Every 24 hours 11/15/14 0046     11/15/14 0100  vancomycin (VANCOCIN) IVPB 1000 mg/200 mL premix     1,000 mg 200 mL/hr over 60 Minutes Intravenous  Once 11/15/14 0059 11/15/14 0216   11/15/14 0030  levofloxacin (LEVAQUIN) IVPB 500 mg  Status:  Discontinued     500 mg 100 mL/hr over 60 Minutes Intravenous Every 24 hours 11/15/14 0030 11/15/14 0043   11/14/14 2145  levofloxacin (LEVAQUIN) IVPB 500 mg     500 mg 100 mL/hr over 60 Minutes Intravenous  Once 11/14/14 2130 11/14/14 2313     Assessment: Okay for Protocol, renal function at baseline.  Initial dose given last evening.  Goal of Therapy:  Vancomycin  trough level 15-20 mcg/ml  Eradicate infection.   Plan:  Levaquin 750mg  IV every 24 hours Vancomycin 500mg  IV every 12 hours. Measure antibiotic drug levels at steady state Follow up culture results  Pricilla Larsson 11/15/2014,10:02 AM

## 2014-11-15 NOTE — Progress Notes (Signed)
PT BECOMES EXTREMELY SOB WITH ANY EXERTION. ESP UP TO BSC AND WHEN TRYING TO EAT.

## 2014-11-16 DIAGNOSIS — J441 Chronic obstructive pulmonary disease with (acute) exacerbation: Secondary | ICD-10-CM

## 2014-11-16 DIAGNOSIS — J9622 Acute and chronic respiratory failure with hypercapnia: Secondary | ICD-10-CM

## 2014-11-16 DIAGNOSIS — J189 Pneumonia, unspecified organism: Secondary | ICD-10-CM

## 2014-11-16 LAB — BASIC METABOLIC PANEL
ANION GAP: 8 (ref 5–15)
BUN: 13 mg/dL (ref 6–20)
CALCIUM: 8.3 mg/dL — AB (ref 8.9–10.3)
CO2: 35 mmol/L — AB (ref 22–32)
CREATININE: 0.63 mg/dL (ref 0.44–1.00)
Chloride: 99 mmol/L — ABNORMAL LOW (ref 101–111)
GLUCOSE: 149 mg/dL — AB (ref 65–99)
POTASSIUM: 4.1 mmol/L (ref 3.5–5.1)
SODIUM: 142 mmol/L (ref 135–145)

## 2014-11-16 LAB — LEGIONELLA ANTIGEN, URINE

## 2014-11-16 LAB — HEMOGLOBIN A1C
Hgb A1c MFr Bld: 6 % — ABNORMAL HIGH (ref 4.8–5.6)
Mean Plasma Glucose: 126 mg/dL

## 2014-11-16 MED ORDER — IPRATROPIUM BROMIDE 0.02 % IN SOLN
0.5000 mg | Freq: Three times a day (TID) | RESPIRATORY_TRACT | Status: DC
  Administered 2014-11-16 – 2014-11-19 (×9): 0.5 mg via RESPIRATORY_TRACT
  Filled 2014-11-16 (×9): qty 2.5

## 2014-11-16 MED ORDER — GUAIFENESIN 100 MG/5ML PO SOLN: 5.0000 mL | ORAL | Status: DC | PRN

## 2014-11-16 MED ORDER — LEVALBUTEROL HCL 1.25 MG/0.5ML IN NEBU
1.2500 mg | INHALATION_SOLUTION | Freq: Three times a day (TID) | RESPIRATORY_TRACT | Status: DC
  Administered 2014-11-16 – 2014-11-19 (×10): 1.25 mg via RESPIRATORY_TRACT
  Filled 2014-11-16 (×10): qty 0.5

## 2014-11-16 MED ORDER — BUDESONIDE 0.25 MG/2ML IN SUSP
0.2500 mg | Freq: Two times a day (BID) | RESPIRATORY_TRACT | Status: DC
  Administered 2014-11-16 – 2014-11-19 (×6): 0.25 mg via RESPIRATORY_TRACT
  Filled 2014-11-16 (×10): qty 2

## 2014-11-16 MED ORDER — IPRATROPIUM BROMIDE 0.02 % IN SOLN: 0.5000 mg | Freq: Four times a day (QID) | RESPIRATORY_TRACT | Status: DC

## 2014-11-16 NOTE — Progress Notes (Signed)
PROGRESS NOTE  [name] ZOX:[id number] DOB: 1955/07/28 DOA: 11/14/2014 PCP: Cassell Smiles., MD  HPI: [name] is an 59 y.o. female with hx of severe COPD, prior smoker, on 4L [place] home oxygen, hx of CAD, hx of SVT, HLD, admitted for right sided CP and increase SOB over the past week. She has anaphylaxis to IV dye, and was given one dose of Lovenox 1mg  per kg pending V/Q scan this am. She was found to have leukocytosis, but has been on oral steroid PTA. She was given IV antibiotics, IV steroids, and neb. Her ABG 7.33/74/pOx=94 on 4 L. Her bicarb was 35, suggestive she is a chronic CO2 retainer.   Subjective / 24 H Interval events - no chest pain, no abdominal pain, nausea or vomiting.  - breathing improved some but she is still pretty dyspneic  Assessment/Plan: Active Problems:   Respiratory failure with hypercapnia   COPD exacerbation   Anemia   Tachycardia   Acute respiratory failure with hypercapnia   CAP - patient febrile at home, CXR with possible pneumonia and with positive strep pneumo - continue Levofloxacin, she is pen allergic.  - improving  COPD exacerbation - continue IV steroids, add pulmicort and Atrovent nebulizers - antibiotics as above - guaifenesin for cough  Acute on chronic respiratory failure with hypoxia and hypercapnia - in the setting of #1 and #2, -  daytime and BiPAP QHS - remain in SDU today  - VQ scan low probability for PE  Anemia - stable, no evidence of bleeding   Diet: Diet Heart Room service appropriate?: Yes; Fluid consistency:: Thin Fluids: none DVT Prophylaxis: Lovenox  Code Status: Full Code Family Communication: d/w daughter Dr. Leretha Pol [phone]) over the phone Disposition Plan: remain in SDU today   Consultants:  None   Procedures:  None    Antibiotics Vancomycin 7/11 >> 7/13 Levofloxacin 7/11 >>   Studies  Ct Abdomen Pelvis Wo Contrast  11/15/2014   CLINICAL DATA:  59 year old  female with COPD presenting with dyspnea fever and cough  EXAM: CT ABDOMEN AND PELVIS WITHOUT CONTRAST  TECHNIQUE: Multidetector CT imaging of the abdomen and pelvis was performed following the standard protocol without IV contrast.  COMPARISON:  CT dated 04/08/2013  FINDINGS: Evaluation of this exam is limited in the absence of intravenous contrast.  There is emphysematous changes of the right lung base with an area of consolidation and air bronchograms compatible with pneumonia.  No intra-abdominal free air or free fluid.  The liver, gallbladder, pancreas, spleen, adrenal glands, kidneys, visualized ureters, and urinary bladder appear unremarkable. A punctate calcific density at the base of the bladder (series 2, image 62 and sagittal image 49) may represent a urethral calculus or focal calcification of the bladder floor.  The uterus is grossly unremarkable.  There is sigmoid diverticulosis with muscular hypertrophy. No active inflammation. Constipation. No bowel obstruction. Normal appendix.  There is aortoiliac atherosclerotic disease. The small fat containing umbilical hernia for degenerative changes of the spine. The  IMPRESSION: Large right lung base consolidation and air bronchogram compatible with pneumonia. Clinical correlation and follow-up to resolution recommended.  Punctate calcific density at the floor of the bladder may represent a urethral calculus versus focal calcification of the bladder floor. There is no hydronephrosis.  Constipation. No evidence of bowel obstruction or inflammation. Normal appendix.  Sigmoid diverticulosis.   Electronically Signed   By: Elgie Collard M.D.   On: 11/15/2014 00:46   Nm Pulmonary Perf And Vent  11/15/2014   CLINICAL DATA:  Chronic shortness of breath. Three-day history of chest pain.  EXAM: NUCLEAR MEDICINE VENTILATION - PERFUSION LUNG SCAN  Views: Anterior, posterior, left lateral, right lateral, RPO, LPO, RAO, LAO -ventilation and perfusion  Radionuclide:  Technetium 65m DTPA-ventilation; Technetium 47m macroaggregated albumin-perfusion  Dose:  38.9 mCi-ventilation; 6.0 mCi-perfusion  Route of administration: Inhalation -ventilation ; intravenous -perfusion  COMPARISON:  Chest radiograph November 14, 2014  FINDINGS: Ventilation: There is near absence of ventilation on the right. Chest radiograph shows extensive bullous disease throughout the right upper lung zone as well as extensive infiltrate throughout the remainder of the right lung. Radiotracer uptake on the left is normal except for a few minimal peripheral ventilation defects.  Perfusion: There is absence of perfusion in the area of the bullous disease in the right upper lobe which corresponds to absence of ventilation in this area. There is no other appreciable perfusion defect. There is significantly better perfusion of the right mid and lower lung zones than ventilation in the right mid and lower lung zones. On the left, there are no perfusion defects.  IMPRESSION: There is a matching ventilation and perfusion defect in the right upper lobe which corresponds to extensive bullous disease seen on radiographic examination. Elsewhere, perfusion is otherwise essentially unremarkable. Note that there is near complete absence of ventilation in the right mid and lower lung zones with significantly better perfusion in these areas. This study constitutes an overall low probability of pulmonary embolus given that the sizable defect in the right upper lobe can be accounted for by bullous disease, documented on chest radiographic examination.   Electronically Signed   By: Bretta Bang III M.D.   On: 11/15/2014 11:48   Dg Chest Portable 1 View  11/14/2014   CLINICAL DATA:  59 year old female with shortness of breath  EXAM: PORTABLE CHEST - 1 VIEW  COMPARISON:  None.  FINDINGS: Single-view of the chest demonstrate emphysematous changes with hyperinflation of the lungs. Bilateral lower lung field linear atelectasis/  scarring noted. There is blunting of the right costophrenic angles which may be related to atelectatic changes or a small pleural effusion. Mild cardiomegaly. The osseous structures are grossly unremarkable. Anterior cervical fixation plate and screws noted.  IMPRESSION: Emphysema with right lower lung field subsegmental atelectasis and possible small right pleural effusion. Superimposed pneumonia is not excluded. Clinical correlation and follow-up recommended stop.   Electronically Signed   By: Elgie Collard M.D.   On: 11/14/2014 21:07    Objective  Filed Vitals:   11/16/14 0700 11/16/14 0736 11/16/14 0800 11/16/14 0900  BP: 105/63  98/78 114/64  Pulse: 66  66 86  Temp: 97.3 F (36.3 C)     TempSrc: Axillary     Resp: 23  25 26   Height:      Weight:      SpO2: 96% 96% 94% 96%    Intake/Output Summary (Last 24 hours) at 11/16/14 1255 Last data filed at 11/15/14 2322  Gross per 24 hour  Intake   1190 ml  Output    500 ml  Net    690 ml   Filed Weights   11/14/14 2013 11/15/14 0100 11/16/14 0500  Weight: 110 lb (49.896 kg) 109 lb 12.6 oz (49.8 kg) 111 lb 12.4 oz (50.7 kg)   Exam:  General:  NAD, [place] in place, eating  HEENT: no scleral icterus, PERRL  Cardiovascular: RRR without MRG, 2+ peripheral pulses, no edema  Respiratory: overall decreased breath sounds, mild  wheezing, no crackles, no rales  Abdomen: soft, non tender, BS +, no guarding  MSK/Extremities: no clubbing/cyanosis, no joint swelling  Skin: no rashes  Neuro: non focal, strength 5/5 in all 4   Data Reviewed: Basic Metabolic Panel:  Recent Labs Lab 11/14/14 2030 11/15/14 0540 11/16/14 0549  NA 141 139 142  K 3.9 4.1 4.1  CL 93* 96* 99*  CO2 36* 34* 35*  GLUCOSE 161* 142* 149*  BUN 11 11 13   CREATININE 0.80 0.64 0.63  CALCIUM 8.9 8.3* 8.3*   Liver Function Tests:  Recent Labs Lab 11/14/14 2030 11/15/14 0540  AST 26 19  ALT 21 19  ALKPHOS 129* 110  BILITOT 0.5 0.4  PROT 7.6 6.6    ALBUMIN 3.1* 2.6*   CBC:  Recent Labs Lab 11/14/14 2030 11/15/14 0540  WBC 24.0* 19.8*  NEUTROABS 22.5* 19.4*  HGB 11.9* 10.4*  HCT 36.9 32.5*  MCV 101.4* 101.9*  PLT 320 299   Cardiac Enzymes:  Recent Labs Lab 11/14/14 2030 11/15/14 0037 11/15/14 0540 11/15/14 1220  TROPONINI <0.03 <0.03 <0.03 <0.03   BNP (last 3 results)  Recent Labs  11/14/14 2030  BNP 104.0*    Recent Results (from the past 240 hour(s))  Culture, blood (routine x 2) Call MD if unable to obtain prior to antibiotics being given     Status: None (Preliminary result)   Collection Time: 11/15/14 12:00 AM  Result Value Ref Range Status   Specimen Description BLOOD RIGHT ANTECUBITAL  Final   Special Requests BOTTLES DRAWN AEROBIC AND ANAEROBIC 10CC EACH  Final   Culture NO GROWTH 1 DAY  Final   Report Status PENDING  Incomplete  Culture, blood (routine x 2) Call MD if unable to obtain prior to antibiotics being given     Status: None (Preliminary result)   Collection Time: 11/15/14 12:37 AM  Result Value Ref Range Status   Specimen Description BLOOD RIGHT ANTECUBITAL  Final   Special Requests BOTTLES DRAWN AEROBIC AND ANAEROBIC 10CC EACH  Final   Culture NO GROWTH 1 DAY  Final   Report Status PENDING  Incomplete  MRSA PCR Screening     Status: None   Collection Time: 11/15/14  1:30 AM  Result Value Ref Range Status   MRSA by PCR NEGATIVE NEGATIVE Final    Comment:        The GeneXpert MRSA Assay (FDA approved for NASAL specimens only), is one component of a comprehensive MRSA colonization surveillance program. It is not intended to diagnose MRSA infection nor to guide or monitor treatment for MRSA infections.      Scheduled Meds: . aspirin  81 mg Oral Daily  . atorvastatin  40 mg Oral Daily  . budesonide (PULMICORT) nebulizer solution  0.25 mg Nebulization BID  . clonazePAM  0.5 mg Oral QHS  . diltiazem  180 mg Oral Daily  . enoxaparin (LOVENOX) injection  40 mg Subcutaneous Q24H   . escitalopram  20 mg Oral Daily  . fluconazole (DIFLUCAN) IV  100 mg Intravenous Q24H  . ipratropium  0.5 mg Nebulization Q6H  . levalbuterol  1.25 mg Nebulization TID  . levofloxacin (LEVAQUIN) IV  750 mg Intravenous Q24H  . loratadine  10 mg Oral Daily  . methylPREDNISolone (SOLU-MEDROL) injection  80 mg Intravenous Q8H  . multivitamin with minerals  1 tablet Oral Daily  . oxybutynin  5 mg Oral Daily  . roflumilast  500 mcg Oral Daily  . sodium chloride  3 mL Intravenous  Q12H  . sodium chloride  3 mL Intravenous Q12H   Continuous Infusions:   Time spent: 35 minutes, 50% bedside, discussing with patient and family members  Pamella Pert, MD [facility] Pager [phone]. If 7 PM - 7 AM, please contact night-coverage at [email or web address], password [facility] 11/16/2014, 12:55 PM  LOS: 2 days

## 2014-11-16 NOTE — Progress Notes (Signed)
PT Cancellation Note  Patient Details Name: [name] MRN: [id number] DOB: 05/19/1955   Cancelled Treatment:    Reason Eval/Treat Not Completed: Other (comment);Patient declined, no reason specified. Chart reviewed, RN consulted. Holding pt treatment at this time at patient request. RN reports pt has been desatting with minimal mobility in room. Pt refuses pt eval at this time stating that she would like to continue napping and does not feel well at this time. Will attempt at later date/time.     Sylvain Hasten C 11/16/2014, 4:15 PM  4:16 PM  [name], PT, DPT [place] License # [id number]

## 2014-11-17 DIAGNOSIS — J9602 Acute respiratory failure with hypercapnia: Secondary | ICD-10-CM

## 2014-11-17 LAB — BLOOD GAS, ARTERIAL
ACID-BASE EXCESS: 13.1 mmol/L — AB (ref 0.0–2.0)
BICARBONATE: 38.9 meq/L — AB (ref 20.0–24.0)
DRAWN BY: 22223
Delivery systems: POSITIVE
EXPIRATORY PAP: 6
FIO2: 35 %
Inspiratory PAP: 12
O2 Saturation: 96.3 %
PO2 ART: 86.4 mmHg (ref 80.0–100.0)
Patient temperature: 37
TCO2: 35.6 mmol/L (ref 0–100)
pCO2 arterial: 69.7 mmHg (ref 35.0–45.0)
pH, Arterial: 7.366 (ref 7.350–7.450)

## 2014-11-17 LAB — BASIC METABOLIC PANEL
Anion gap: 10 (ref 5–15)
BUN: 17 mg/dL (ref 6–20)
CALCIUM: 8.3 mg/dL — AB (ref 8.9–10.3)
CO2: 37 mmol/L — AB (ref 22–32)
Chloride: 96 mmol/L — ABNORMAL LOW (ref 101–111)
Creatinine, Ser: 0.7 mg/dL (ref 0.44–1.00)
GFR calc Af Amer: >60 mL/min (ref >60)
GFR calc non Af Amer: >60 mL/min (ref >60)
Glucose, Bld: 140 mg/dL — ABNORMAL HIGH (ref 65–99)
POTASSIUM: 4.2 mmol/L (ref 3.5–5.1)
SODIUM: 143 mmol/L (ref 135–145)

## 2014-11-17 LAB — CBC
HCT: 37 % (ref 36.0–46.0)
HEMOGLOBIN: 11.8 g/dL — AB (ref 12.0–15.0)
MCH: 32.6 pg (ref 26.0–34.0)
MCHC: 31.9 g/dL (ref 30.0–36.0)
MCV: 102.2 fL — ABNORMAL HIGH (ref 78.0–100.0)
Platelets: 453 10*3/uL — ABNORMAL HIGH (ref 150–400)
RBC: 3.62 MIL/uL — ABNORMAL LOW (ref 3.87–5.11)
RDW: 13.1 % (ref 11.5–15.5)
WBC: 17.3 10*3/uL — AB (ref 4.0–10.5)

## 2014-11-17 MED ORDER — GUAIFENESIN ER 600 MG PO TB12
1200.0000 mg | ORAL_TABLET | Freq: Two times a day (BID) | ORAL | Status: DC
  Administered 2014-11-17 – 2014-11-19 (×5): 1200 mg via ORAL
  Filled 2014-11-17 (×5): qty 2

## 2014-11-17 NOTE — Evaluation (Signed)
Physical Therapy Evaluation Patient Details Name: [name] MRN: [id number] DOB: July 06, 1955 Today's Date: 11/17/2014   History of Present Illness  Brought in by EMS on CPAP, [name] is a 59 y.o. female with PMHx of COPD who presents to the Emergency Department complaining of increasing SOB with onset 6 days ago. 4 L of oxygen and her home inhalers were not sufficient to relieve her symptoms. Associated symptoms include subjective fever, "bubbly" clear nasal discharge, "sore" abdominal pain,and myalgias. Pt denies cough, chest pain, back pain, and LE swelling. At home she uses Bipap when laying down. She has never been hospitalized for her COPD. Pt denies cardiac history and history of PE. No recent travel.  Pt has been found to have pneumonia.  Clinical Impression   Pt was seen for evaluation.  She was alert and oriented, very pleasant and cooperative.  Staff reports that pt has been resistant to increasing her activity with them but now seems quite willing.  Her HR and O2 sats are WNL on 5 L O2 at rest.  Her functional strength is WNL and she is independent in transfers.  A walker was used for gait in order to decrease her exertional level.  She was able to ambulate 175' with O2 at 6 L/min (5 L/min not available on O2 tank).  Gait stability was good.  She is clearly very deconditioned due to severe respiratory disease.  In my opinion, she would benefit from HHPT initially upon d/c in order to help motivate her to continue to increase her activity level.  We will follow on our schedule and I will ask nursing service to have her walk in the hallway intermittently.    Follow Up Recommendations Home health PT    Equipment Recommendations  None recommended by PT    Recommendations for Other Services   none    Precautions / Restrictions Precautions Precautions: Fall Restrictions Weight Bearing Restrictions: No      Mobility  Bed Mobility Overal bed mobility: Independent                 Transfers Overall transfer level: Independent                  Ambulation/Gait Ambulation/Gait assistance: Supervision Ambulation Distance (Feet): 175 Feet Assistive device: Rolling walker (2 wheeled) Gait Pattern/deviations: Trunk flexed;WFL(Within Functional Limits)   Gait velocity interpretation: Below normal speed for age/gender General Gait Details: appropriate for situation  Stairs            Wheelchair Mobility    Modified Rankin (Stroke Patients Only)       Balance Overall balance assessment: No apparent balance deficits (not formally assessed)                                           Pertinent Vitals/Pain Pain Assessment: No/denies pain    Home Living Family/patient expects to be discharged to:: Private residence Living Arrangements: Alone Available Help at Discharge: Family;Available PRN/intermittently Type of Home: House Home Access: Ramped entrance     Home Layout: One level;Laundry or work area in [employer] Equipment: Environmental consultant - 2 wheels (small grocery cart that she uses as a walker)      Prior Function Level of Independence: Independent with assistive device(s)         Comments: sister assists her with getting groceries and accompanying her to MD  appointments     Hand Dominance        Extremity/Trunk Assessment   Upper Extremity Assessment: Overall WFL for tasks assessed           Lower Extremity Assessment: Overall WFL for tasks assessed      Cervical / Trunk Assessment: Kyphotic  Communication   Communication: No difficulties  Cognition Arousal/Alertness: Awake/alert Behavior During Therapy: WFL for tasks assessed/performed Overall Cognitive Status: Within Functional Limits for tasks assessed                      General Comments      Exercises        Assessment/Plan    PT Assessment Patient needs continued PT services  PT Diagnosis Difficulty walking  (generally deconditioned)   PT Problem List Decreased activity tolerance;Decreased mobility;Cardiopulmonary status limiting activity  PT Treatment Interventions Gait training;Therapeutic exercise;Patient/family education   PT Goals (Current goals can be found in the Care Plan section) Acute Rehab PT Goals Patient Stated Goal: return to maximal independence at home PT Goal Formulation: With patient Time For Goal Achievement: 12/01/14 Potential to Achieve Goals: Good    Frequency Min 3X/week   Barriers to discharge   none    Co-evaluation               End of Session Equipment Utilized During Treatment: Gait belt;Oxygen Activity Tolerance: Patient tolerated treatment well Patient left: in chair;with call bell/phone within reach Nurse Communication: Mobility status         Time: 0865-7846 PT Time Calculation (min) (ACUTE ONLY): 36 min   Charges:   PT Evaluation $Initial PT Evaluation Tier I: 1 Procedure     PT G CodesMyrlene Broker L  PT 11/17/2014, 4:21 PM [phone]

## 2014-11-17 NOTE — Care Management Important Message (Signed)
Important Message  Patient Details  Name: [name] MRN: [id number] Date of Birth: [birth year]   Medicare Important Message Given:  Yes-second notification given    Sherald Barge, RN 11/17/2014, 12:38 PM

## 2014-11-17 NOTE — Consult Note (Signed)
Consult requested by: [facility] hospitalist Consult requested for respiratory failure:  HPI: This is a 59 year old with a known history of severe COPD. She stopped smoking many years ago. She's been on oxygen at home for some time and has also been on BiPAP at night and if she lies down to take a nap. She said that she was in her usual state of fairly poor health when she developed mostly systemic symptoms with aching all over fever and chills. She did not feel that she had a lot of respiratory symptoms at least initially but then she started having right-sided chest pain and increased shortness of breath. Her CT abdomen without contrast shows a right lower lobe infiltrate consistent with pneumonia and I think that's consistent with her clinical course. She says she feels better but not well. She's coughing in the last 24 hours or so. Her chest pain is still giving her trouble.  Past Medical History  Diagnosis Date  . COPD (chronic obstructive pulmonary disease)     on home o2 4LNC  . Hypoglycemia   . SVT (supraventricular tachycardia)   . Tachycardia   . Complication of anesthesia     o2 sats dropped after back surgery  . Shortness of breath   . IBS (irritable bowel syndrome)   . Diverticulosis   . Hyperlipidemia      Family History  Problem Relation Age of Onset  . Asthma Mother   . Heart attack Mother   . Diabetes Mother   . Heart failure Mother      History   Social History  . Marital Status: Divorced    Spouse Name: N/A  . Number of Children: N/A  . Years of Education: N/A   Social History Main Topics  . Smoking status: Former Smoker -- 1.00 packs/day for 40 years    Types: Cigarettes    Quit date: 05/07/2011  . Smokeless tobacco: Never Used     Comment: presently smoking about 2 a day  . Alcohol Use: 0.0 oz/week    0 Standard drinks or equivalent per week     Comment: rare  . Drug Use: No  . Sexual Activity: Not on file   Other Topics Concern  . None   Social  History Narrative     ROS: She is pretty short of breath with minimal activity. She continues to have chest pain. She has not had any hemoptysis. No diarrhea but she has had some nausea. Otherwise per the history and physical    Objective: Vital signs in last 24 hours: Temp:  [96.8 F (36 C)-98 F (36.7 C)] 96.8 F (36 C) (07/14 0900) Pulse Rate:  [30-95] 83 (07/14 1100) Resp:  [13-32] 14 (07/14 1100) BP: (95-137)/(53-82) 123/68 mmHg (07/14 1100) SpO2:  [78 %-97 %] 95 % (07/14 1100) FiO2 (%):  [35 %] 35 % (07/14 0752) Weight:  [50.9 kg (112 lb 3.4 oz)] 50.9 kg (112 lb 3.4 oz) (07/14 0500) Weight change: 0.2 kg (7.1 oz) Last BM Date: 11/16/14  Intake/Output from previous day: 07/13 0701 - 07/14 0700 In: 440 [P.O.:240; IV Piggyback:200] Out: 400 [Urine:400]  PHYSICAL EXAM She is awake and alert and looks relatively comfortable. Her HEENT examination is generally unremarkable. Her neck is supple. Her chest shows diminished breath sounds and some rales in the right base. Her abdomen is soft without masses. Extremities showed no edema. Central nervous system examination is grossly intact. Her heart is regular and I do not hear an S3 gallop.  Lab Results: Basic Metabolic Panel:  Recent Labs  11/16/14 0549 11/17/14 0548  NA 142 143  K 4.1 4.2  CL 99* 96*  CO2 35* 37*  GLUCOSE 149* 140*  BUN 13 17  CREATININE 0.63 0.70  CALCIUM 8.3* 8.3*   Liver Function Tests:  Recent Labs  11/14/14 2030 11/15/14 0540  AST 26 19  ALT 21 19  ALKPHOS 129* 110  BILITOT 0.5 0.4  PROT 7.6 6.6  ALBUMIN 3.1* 2.6*   No results for input(s): LIPASE, AMYLASE in the last 72 hours. No results for input(s): AMMONIA in the last 72 hours. CBC:  Recent Labs  11/14/14 2030 11/15/14 0540 11/17/14 0548  WBC 24.0* 19.8* 17.3*  NEUTROABS 22.5* 19.4*  --   HGB 11.9* 10.4* 11.8*  HCT 36.9 32.5* 37.0  MCV 101.4* 101.9* 102.2*  PLT 320 299 453*   Cardiac Enzymes:  Recent Labs   11/15/14 0037 11/15/14 0540 11/15/14 1220  TROPONINI <0.03 <0.03 <0.03   BNP: No results for input(s): PROBNP in the last 72 hours. D-Dimer:  Recent Labs  11/14/14 2030  DDIMER 2.15*   CBG: No results for input(s): GLUCAP in the last 72 hours. Hemoglobin A1C:  Recent Labs  11/14/14 2030  HGBA1C 6.0*   Fasting Lipid Panel: No results for input(s): CHOL, HDL, LDLCALC, TRIG, CHOLHDL, LDLDIRECT in the last 72 hours. Thyroid Function Tests:  Recent Labs  11/14/14 2030  TSH 1.116   Anemia Panel: No results for input(s): VITAMINB12, FOLATE, FERRITIN, TIBC, IRON, RETICCTPCT in the last 72 hours. Coagulation:  Recent Labs  11/15/14 0540  LABPROT 15.5*  INR 1.21   Urine Drug Screen: Drugs of Abuse  No results found for: LABOPIA, COCAINSCRNUR, LABBENZ, AMPHETMU, THCU, LABBARB  Alcohol Level: No results for input(s): ETH in the last 72 hours. Urinalysis: No results for input(s): COLORURINE, LABSPEC, PHURINE, GLUCOSEU, HGBUR, BILIRUBINUR, KETONESUR, PROTEINUR, UROBILINOGEN, NITRITE, LEUKOCYTESUR in the last 72 hours.  Invalid input(s): APPERANCEUR Misc. Labs:   ABGS:  Recent Labs  11/17/14 0535  PHART 7.366  PO2ART 86.4  TCO2 35.6  HCO3 38.9*     MICROBIOLOGY: Recent Results (from the past 240 hour(s))  Culture, blood (routine x 2) Call MD if unable to obtain prior to antibiotics being given     Status: None (Preliminary result)   Collection Time: 11/15/14 12:00 AM  Result Value Ref Range Status   Specimen Description BLOOD RIGHT ANTECUBITAL  Final   Special Requests BOTTLES DRAWN AEROBIC AND ANAEROBIC 10CC EACH  Final   Culture NO GROWTH 2 DAYS  Final   Report Status PENDING  Incomplete  Culture, blood (routine x 2) Call MD if unable to obtain prior to antibiotics being given     Status: None (Preliminary result)   Collection Time: 11/15/14 12:37 AM  Result Value Ref Range Status   Specimen Description BLOOD RIGHT ANTECUBITAL  Final   Special  Requests BOTTLES DRAWN AEROBIC AND ANAEROBIC 10CC EACH  Final   Culture NO GROWTH 2 DAYS  Final   Report Status PENDING  Incomplete  MRSA PCR Screening     Status: None   Collection Time: 11/15/14  1:30 AM  Result Value Ref Range Status   MRSA by PCR NEGATIVE NEGATIVE Final    Comment:        The GeneXpert MRSA Assay (FDA approved for NASAL specimens only), is one component of a comprehensive MRSA colonization surveillance program. It is not intended to diagnose MRSA infection nor to guide or monitor  treatment for MRSA infections.     Studies/Results: No results found.  Medications:  Prior to Admission:  Prescriptions prior to admission  Medication Sig Dispense Refill Last Dose  . albuterol (PROAIR HFA) 108 (90 BASE) MCG/ACT inhaler Inhale 2 puffs into the lungs every 6 (six) hours as needed. For shortness of breath   11/14/2014 at Unknown time  . ALPRAZolam (XANAX) 0.5 MG tablet Take 0.5 mg by mouth daily as needed. anxiety   Past Week at Unknown time  . aspirin 81 MG tablet Take 81 mg by mouth daily.   11/14/2014 at Unknown time  . atorvastatin (LIPITOR) 40 MG tablet Take 1 tablet (40 mg total) by mouth daily. 90 tablet 3 11/13/2014 at Unknown time  . cetirizine (ZYRTEC) 10 MG tablet Take 10 mg by mouth daily.     11/14/2014 at Unknown time  . clonazePAM (KLONOPIN) 0.5 MG tablet Take 0.5 mg by mouth at bedtime.   11/13/2014 at Unknown time  . diltiazem (CARDIZEM CD) 180 MG 24 hr capsule TAKE 1 CAPSULE BY MOUTH DAILY 90 capsule 3 11/14/2014 at Unknown time  . escitalopram (LEXAPRO) 20 MG tablet Take 20 mg by mouth daily.     11/14/2014 at Unknown time  . Fluticasone-Salmeterol (ADVAIR) 250-50 MCG/DOSE AEPB Inhale 1 puff into the lungs 2 (two) times daily.    11/14/2014 at Unknown time  . ibuprofen (ADVIL,MOTRIN) 200 MG tablet Take 400 mg by mouth 2 (two) times daily as needed for mild pain or moderate pain.   11/14/2014 at Unknown time  . Multiple Vitamins-Minerals (MULTIVITAMINS THER.  W/MINERALS) TABS Take 1 tablet by mouth daily.     11/14/2014 at Unknown time  . oxybutynin (DITROPAN) 5 MG tablet Take 5 mg by mouth daily.    11/14/2014 at Unknown time  . predniSONE (DELTASONE) 10 MG tablet 10 mg daily.    11/14/2014 at Unknown time  . roflumilast (DALIRESP) 500 MCG TABS tablet Take 500 mcg by mouth daily.     11/14/2014 at Unknown time  . SPIRIVA RESPIMAT 2.5 MCG/ACT AERS Inhale 1 puff into the lungs daily.    11/14/2014 at Unknown time  . triamcinolone (NASACORT ALLERGY 24HR) 55 MCG/ACT AERO nasal inhaler Place 2 sprays into the nose daily as needed (FOR ALLERGIES).   Past Week at Unknown time  . fluconazole (DIFLUCAN) 150 MG tablet Take 150 mg by mouth once a week.    Taking   Scheduled: . aspirin  81 mg Oral Daily  . atorvastatin  40 mg Oral Daily  . budesonide (PULMICORT) nebulizer solution  0.25 mg Nebulization BID  . clonazePAM  0.5 mg Oral QHS  . diltiazem  180 mg Oral Daily  . enoxaparin (LOVENOX) injection  40 mg Subcutaneous Q24H  . escitalopram  20 mg Oral Daily  . fluconazole (DIFLUCAN) IV  100 mg Intravenous Q24H  . guaiFENesin  1,200 mg Oral BID  . ipratropium  0.5 mg Nebulization TID  . levalbuterol  1.25 mg Nebulization TID  . levofloxacin (LEVAQUIN) IV  750 mg Intravenous Q24H  . loratadine  10 mg Oral Daily  . methylPREDNISolone (SOLU-MEDROL) injection  80 mg Intravenous Q8H  . multivitamin with minerals  1 tablet Oral Daily  . oxybutynin  5 mg Oral Daily  . roflumilast  500 mcg Oral Daily  . sodium chloride  3 mL Intravenous Q12H  . sodium chloride  3 mL Intravenous Q12H   Continuous:  OJJ:KKXFGH chloride, acetaminophen **OR** acetaminophen, ALPRAZolam, fluticasone  Assesment: She has  community-acquired pneumonia and acute on chronic hypercapnic and hypoxic respiratory failure from that. She has severe COPD at baseline and requires oxygen and positive pressure ventilation. She is on appropriate antibiotic, she is on steroids and nebulizer treatments  and roflumilast. She seems to be slowly improving. Her blood gas done this morning on BiPAP is probably at about her baseline. Active Problems:   Respiratory failure with hypercapnia   COPD exacerbation   Anemia   Tachycardia   Acute respiratory failure with hypercapnia    Plan: I added Mucinex on a routine basis and a flutter valve. I will check with her primary pulmonologist    LOS: 3 days   Aithana Kushner L 11/17/2014, 1:48 PM

## 2014-11-17 NOTE — Progress Notes (Signed)
ABG drawn. PCO2 69.7 Pt will continue on with BiPAP

## 2014-11-17 NOTE — Progress Notes (Signed)
PROGRESS NOTE  [name] ZOX:[id number] DOB: 06-Jul-1955 DOA: 11/14/2014 PCP: Cassell Smiles., MD  HPI: [name] is an 59 y.o. female with hx of severe COPD, prior smoker, on 4L [place] home oxygen, hx of CAD, hx of SVT, HLD, admitted for right sided CP and increase SOB over the past week. She has anaphylaxis to IV dye, and was given one dose of Lovenox 1mg  per kg pending V/Q scan this am. She was found to have leukocytosis, but has been on oral steroid PTA. She was given IV antibiotics, IV steroids, and neb. Her ABG 7.33/74/pOx=94 on 4 L. Her bicarb was 35, suggestive she is a chronic CO2 retainer.   Subjective / 24 H Interval events - no chest pain, no abdominal pain, nausea or vomiting.  - feels better but not back to baseline  Assessment/Plan: Active Problems:   Respiratory failure with hypercapnia   COPD exacerbation   Anemia   Tachycardia   Acute respiratory failure with hypercapnia   CAP - patient febrile at home, CXR with possible pneumonia and with positive strep pneumo - continue Levofloxacin, she is pen allergic.  - improving  COPD exacerbation - continue IV steroids, add pulmicort and Atrovent nebulizers - antibiotics as above - guaifenesin for cough - ABG with persistent CO2 retention, I don't have a baseline CO2 for her.  - Pulmonology consulted, appreciate input into optimizing her regimen   Acute on chronic respiratory failure with hypoxia and hypercapnia - in the setting of #1 and #2, - [place] daytime and BiPAP QHS - remain in SDU today since she is using BiPAP PRN - VQ scan low probability for PE  Anemia - stable, no evidence of bleeding   Diet: Diet Heart Room service appropriate?: Yes; Fluid consistency:: Thin Fluids: none DVT Prophylaxis: Lovenox  Code Status: Full Code Family Communication: no family bedside today. 7/13 d/w daughter Dr. Leretha Pol [phone]) over the phone Disposition Plan: remain in SDU today    Consultants:  None   Procedures:  None    Antibiotics Vancomycin 7/11 >> 7/13 Levofloxacin 7/11 >>   Studies  No results found.  Objective  Filed Vitals:   11/17/14 0752 11/17/14 0800 11/17/14 0900 11/17/14 1000  BP: 122/79 132/73 116/71 119/64  Pulse:  76 77 95  Temp:   96.8 F (36 C)   TempSrc:   Axillary   Resp: 23 25 22 13   Height:      Weight:      SpO2: 90% 88% 94% 93%    Intake/Output Summary (Last 24 hours) at 11/17/14 1151 Last data filed at 11/16/14 2300  Gross per 24 hour  Intake    440 ml  Output    400 ml  Net     40 ml   Filed Weights   11/15/14 0100 11/16/14 0500 11/17/14 0500  Weight: 49.8 kg (109 lb 12.6 oz) 50.7 kg (111 lb 12.4 oz) 50.9 kg (112 lb 3.4 oz)   Exam:  General:  NAD, sleeping  HEENT: no scleral icterus, PERRL  Cardiovascular: RRR without MRG, 2+ peripheral pulses, no edema  Respiratory: overall decreased breath sounds, mild wheezing, no crackles, no rales  Abdomen: soft, non tender, BS +, no guarding  MSK/Extremities: no clubbing/cyanosis, no joint swelling  Skin: no rashes  Neuro: non focal, strength 5/5 in all 4   Data Reviewed: Basic Metabolic Panel:  Recent Labs Lab 11/14/14 2030 11/15/14 0540 11/16/14 0549 11/17/14 0548  NA 141 139 142 143  K  3.9 4.1 4.1 4.2  CL 93* 96* 99* 96*  CO2 36* 34* 35* 37*  GLUCOSE 161* 142* 149* 140*  BUN 11 11 13 17   CREATININE 0.80 0.64 0.63 0.70  CALCIUM 8.9 8.3* 8.3* 8.3*   Liver Function Tests:  Recent Labs Lab 11/14/14 2030 11/15/14 0540  AST 26 19  ALT 21 19  ALKPHOS 129* 110  BILITOT 0.5 0.4  PROT 7.6 6.6  ALBUMIN 3.1* 2.6*   CBC:  Recent Labs Lab 11/14/14 2030 11/15/14 0540 11/17/14 0548  WBC 24.0* 19.8* 17.3*  NEUTROABS 22.5* 19.4*  --   HGB 11.9* 10.4* 11.8*  HCT 36.9 32.5* 37.0  MCV 101.4* 101.9* 102.2*  PLT 320 299 453*   Cardiac Enzymes:  Recent Labs Lab 11/14/14 2030 11/15/14 0037 11/15/14 0540 11/15/14 1220  TROPONINI  <0.03 <0.03 <0.03 <0.03   BNP (last 3 results)  Recent Labs  11/14/14 2030  BNP 104.0*    Recent Results (from the past 240 hour(s))  Culture, blood (routine x 2) Call MD if unable to obtain prior to antibiotics being given     Status: None (Preliminary result)   Collection Time: 11/15/14 12:00 AM  Result Value Ref Range Status   Specimen Description BLOOD RIGHT ANTECUBITAL  Final   Special Requests BOTTLES DRAWN AEROBIC AND ANAEROBIC 10CC EACH  Final   Culture NO GROWTH 2 DAYS  Final   Report Status PENDING  Incomplete  Culture, blood (routine x 2) Call MD if unable to obtain prior to antibiotics being given     Status: None (Preliminary result)   Collection Time: 11/15/14 12:37 AM  Result Value Ref Range Status   Specimen Description BLOOD RIGHT ANTECUBITAL  Final   Special Requests BOTTLES DRAWN AEROBIC AND ANAEROBIC 10CC EACH  Final   Culture NO GROWTH 2 DAYS  Final   Report Status PENDING  Incomplete  MRSA PCR Screening     Status: None   Collection Time: 11/15/14  1:30 AM  Result Value Ref Range Status   MRSA by PCR NEGATIVE NEGATIVE Final    Comment:        The GeneXpert MRSA Assay (FDA approved for NASAL specimens only), is one component of a comprehensive MRSA colonization surveillance program. It is not intended to diagnose MRSA infection nor to guide or monitor treatment for MRSA infections.      Scheduled Meds: . aspirin  81 mg Oral Daily  . atorvastatin  40 mg Oral Daily  . budesonide (PULMICORT) nebulizer solution  0.25 mg Nebulization BID  . clonazePAM  0.5 mg Oral QHS  . diltiazem  180 mg Oral Daily  . enoxaparin (LOVENOX) injection  40 mg Subcutaneous Q24H  . escitalopram  20 mg Oral Daily  . fluconazole (DIFLUCAN) IV  100 mg Intravenous Q24H  . ipratropium  0.5 mg Nebulization TID  . levalbuterol  1.25 mg Nebulization TID  . levofloxacin (LEVAQUIN) IV  750 mg Intravenous Q24H  . loratadine  10 mg Oral Daily  . methylPREDNISolone (SOLU-MEDROL)  injection  80 mg Intravenous Q8H  . multivitamin with minerals  1 tablet Oral Daily  . oxybutynin  5 mg Oral Daily  . roflumilast  500 mcg Oral Daily  . sodium chloride  3 mL Intravenous Q12H  . sodium chloride  3 mL Intravenous Q12H   Continuous Infusions:    Pamella Pert, MD [facility] Pager [phone]. If 7 PM - 7 AM, please contact night-coverage at [email or web address], password [facility] 11/17/2014, 11:51 AM  LOS:  3 days

## 2014-11-18 LAB — BASIC METABOLIC PANEL
Anion gap: 6 (ref 5–15)
BUN: 19 mg/dL (ref 6–20)
CALCIUM: 8 mg/dL — AB (ref 8.9–10.3)
CO2: 39 mmol/L — AB (ref 22–32)
CREATININE: 0.65 mg/dL (ref 0.44–1.00)
Chloride: 96 mmol/L — ABNORMAL LOW (ref 101–111)
GFR calc Af Amer: >60 mL/min (ref >60)
GFR calc non Af Amer: >60 mL/min (ref >60)
Glucose, Bld: 153 mg/dL — ABNORMAL HIGH (ref 65–99)
Potassium: 3.6 mmol/L (ref 3.5–5.1)
Sodium: 141 mmol/L (ref 135–145)

## 2014-11-18 MED ORDER — LEVOFLOXACIN 750 MG PO TABS
750.0000 mg | ORAL_TABLET | Freq: Every day | ORAL | Status: DC
  Administered 2014-11-18 – 2014-11-19 (×2): 750 mg via ORAL
  Filled 2014-11-18 (×2): qty 1

## 2014-11-18 MED ORDER — PREDNISONE 20 MG PO TABS
50.0000 mg | ORAL_TABLET | Freq: Every day | ORAL | Status: DC
  Administered 2014-11-19: 50 mg via ORAL
  Filled 2014-11-18: qty 1
  Filled 2014-11-18: qty 2

## 2014-11-18 MED ORDER — FLUCONAZOLE 100 MG PO TABS
100.0000 mg | ORAL_TABLET | Freq: Every day | ORAL | Status: DC
  Administered 2014-11-18 – 2014-11-19 (×2): 100 mg via ORAL
  Filled 2014-11-18 (×2): qty 1

## 2014-11-18 NOTE — Progress Notes (Signed)
Physical Therapy Treatment Patient Details Name: [name] MRN: [id number] DOB: 06-24-1955 Today's Date: 11/18/2014    History of Present Illness Brought in by EMS on CPAP, [name] is a 59 y.o. female with PMHx of COPD who presents to the Emergency Department complaining of increasing SOB with onset 6 days ago. 4 L of oxygen and her home inhalers were not sufficient to relieve her symptoms. Associated symptoms include subjective fever, "bubbly" clear nasal discharge, "sore" abdominal pain,and myalgias. Pt denies cough, chest pain, back pain, and LE swelling. At home she uses Bipap when laying down. She has never been hospitalized for her COPD. Pt denies cardiac history and history of PE. No recent travel.    PT Comments    Pt stated that she continues to have significant dyspnea with any activity.  She is on 4.5 L O2.  She has dyspnea with speaking.  She wanted to begin the visit with gait training.  She was instructed in gait with a walker, cues for increased thoracic extension.  She was very fatigued after ambulating 175' with a walker,  gait was stable.  I have strongly recommended that pt have someone stay with her initially upon discharge.  She thinks that her sister will be able to help her out.  Follow Up Recommendations  Home health PT     Equipment Recommendations  None recommended by PT    Recommendations for Other Services  none     Precautions / Restrictions Precautions Precautions: Fall Restrictions Weight Bearing Restrictions: No    Mobility  Bed Mobility Overal bed mobility: Independent                Transfers Overall transfer level: Independent                  Ambulation/Gait Ambulation/Gait assistance: Supervision Ambulation Distance (Feet): 175 Feet Assistive device: Rolling walker (2 wheeled) Gait Pattern/deviations: WFL(Within Functional Limits);Trunk flexed   Gait velocity interpretation: Below normal speed for  age/gender General Gait Details: appropriate for situation   Stairs            Wheelchair Mobility    Modified Rankin (Stroke Patients Only)       Balance Overall balance assessment: No apparent balance deficits (not formally assessed)                                  Cognition Arousal/Alertness: Awake/alert Behavior During Therapy: WFL for tasks assessed/performed Overall Cognitive Status: Within Functional Limits for tasks assessed                      Exercises      General Comments        Pertinent Vitals/Pain Pain Assessment: No/denies pain    Home Living                      Prior Function            PT Goals (current goals can now be found in the care plan section) Progress towards PT goals: Progressing toward goals;Not progressing toward goals - comment (pt unable to do any exercise due to dyspnea)    Frequency  Min 3X/week    PT Plan Current plan remains appropriate    Co-evaluation             End of Session Equipment Utilized During Treatment: Gait belt;Oxygen Activity  Tolerance: Patient limited by fatigue Patient left: in chair;with call bell/phone within reach;with nursing/sitter in room     Time: 1328-1401 PT Time Calculation (min) (ACUTE ONLY): 33 min  Charges:  $Gait Training: 8-22 mins                    G CodesKonrad Penta  PT 11/18/2014  [phone]

## 2014-11-18 NOTE — Progress Notes (Signed)
PROGRESS NOTE  [name] UXL:[id number] DOB: 1956-03-04 DOA: 11/14/2014 PCP: Cassell Smiles., MD  HPI: [name] is an 59 y.o. female with hx of severe COPD, prior smoker, on 4L [place] home oxygen, hx of CAD, hx of SVT, HLD, admitted for right sided CP and increase SOB over the past week. She has anaphylaxis to IV dye, and was given one dose of Lovenox 1mg  per kg pending V/Q scan this am. She was found to have leukocytosis, but has been on oral steroid PTA. She was given IV antibiotics, IV steroids, and neb. Her ABG 7.33/74/pOx=94 on 4 L. Her bicarb was 35, suggestive she is a chronic CO2 retainer.   Subjective / 24 H Interval events - no chest pain, no abdominal pain, nausea or vomiting.  - feels better, happy that was able to walk with PT yesterday but was "drained"  Assessment/Plan: Active Problems:   Respiratory failure with hypercapnia   COPD exacerbation   Anemia   Tachycardia   Acute respiratory failure with hypercapnia   CAP - patient febrile at home, CXR with possible pneumonia and with positive strep pneumo - continue Levofloxacin, she is pen allergic.  - improving  COPD exacerbation - transition to po prednisone today, continue nebs, appreciate Dr. Juanetta Gosling input - antibiotics as above - guaifenesin for cough   Acute on chronic respiratory failure with hypoxia and hypercapnia - in the setting of #1 and #2, -  daytime and BiPAP and QHS - remain in SDU today since she is using BiPAP PRN - VQ scan low probability for PE  Anemia - stable, no evidence of bleeding   Diet: Diet Heart Room service appropriate?: Yes; Fluid consistency:: Thin Fluids: none DVT Prophylaxis: Lovenox  Code Status: Full Code Family Communication: d/w daughter Dr. Leretha Pol [phone] over the phone Disposition Plan: hopefully home in am   Consultants:  Pulmonology   Procedures:  None    Antibiotics Vancomycin 7/11 >> 7/13 Levofloxacin 7/11  >>   Studies  No results found.  Objective  Filed Vitals:   11/18/14 0600 11/18/14 0814 11/18/14 0825 11/18/14 0919  BP: 122/68     Pulse: 80     Temp:   96.5 F (35.8 C)   TempSrc:   Axillary   Resp: 15     Height:      Weight:      SpO2: 97% 93%  94%    Intake/Output Summary (Last 24 hours) at 11/18/14 1205 Last data filed at 11/17/14 2220  Gross per 24 hour  Intake      3 ml  Output      0 ml  Net      3 ml   Filed Weights   11/16/14 0500 11/17/14 0500 11/18/14 0400  Weight: 50.7 kg (111 lb 12.4 oz) 50.9 kg (112 lb 3.4 oz) 50.5 kg (111 lb 5.3 oz)   Exam:  General:  NAD, sleeping  HEENT: no scleral icterus, PERRL  Cardiovascular: RRR without MRG, 2+ peripheral pulses, no edema  Respiratory: overall decreased breath sounds, mild wheezing, no crackles, no rales  Abdomen: soft, non tender, BS +, no guarding  MSK/Extremities: no clubbing/cyanosis, no joint swelling  Skin: no rashes  Neuro: non focal, strength 5/5 in all 4   Data Reviewed: Basic Metabolic Panel:  Recent Labs Lab 11/14/14 2030 11/15/14 0540 11/16/14 0549 11/17/14 0548 11/18/14 0535  NA 141 139 142 143 141  K 3.9 4.1 4.1 4.2 3.6  CL 93* 96* 99*  96* 96*  CO2 36* 34* 35* 37* 39*  GLUCOSE 161* 142* 149* 140* 153*  BUN 11 11 13 17 19   CREATININE 0.80 0.64 0.63 0.70 0.65  CALCIUM 8.9 8.3* 8.3* 8.3* 8.0*   Liver Function Tests:  Recent Labs Lab 11/14/14 2030 11/15/14 0540  AST 26 19  ALT 21 19  ALKPHOS 129* 110  BILITOT 0.5 0.4  PROT 7.6 6.6  ALBUMIN 3.1* 2.6*   CBC:  Recent Labs Lab 11/14/14 2030 11/15/14 0540 11/17/14 0548  WBC 24.0* 19.8* 17.3*  NEUTROABS 22.5* 19.4*  --   HGB 11.9* 10.4* 11.8*  HCT 36.9 32.5* 37.0  MCV 101.4* 101.9* 102.2*  PLT 320 299 453*   Cardiac Enzymes:  Recent Labs Lab 11/14/14 2030 11/15/14 0037 11/15/14 0540 11/15/14 1220  TROPONINI <0.03 <0.03 <0.03 <0.03   BNP (last 3 results)  Recent Labs  11/14/14 2030  BNP 104.0*     Recent Results (from the past 240 hour(s))  Culture, blood (routine x 2) Call MD if unable to obtain prior to antibiotics being given     Status: None (Preliminary result)   Collection Time: 11/15/14 12:00 AM  Result Value Ref Range Status   Specimen Description BLOOD RIGHT ANTECUBITAL  Final   Special Requests BOTTLES DRAWN AEROBIC AND ANAEROBIC 10CC EACH  Final   Culture NO GROWTH 3 DAYS  Final   Report Status PENDING  Incomplete  Culture, blood (routine x 2) Call MD if unable to obtain prior to antibiotics being given     Status: None (Preliminary result)   Collection Time: 11/15/14 12:37 AM  Result Value Ref Range Status   Specimen Description BLOOD RIGHT ANTECUBITAL  Final   Special Requests BOTTLES DRAWN AEROBIC AND ANAEROBIC 10CC EACH  Final   Culture NO GROWTH 3 DAYS  Final   Report Status PENDING  Incomplete  MRSA PCR Screening     Status: None   Collection Time: 11/15/14  1:30 AM  Result Value Ref Range Status   MRSA by PCR NEGATIVE NEGATIVE Final    Comment:        The GeneXpert MRSA Assay (FDA approved for NASAL specimens only), is one component of a comprehensive MRSA colonization surveillance program. It is not intended to diagnose MRSA infection nor to guide or monitor treatment for MRSA infections.      Scheduled Meds: . aspirin  81 mg Oral Daily  . atorvastatin  40 mg Oral Daily  . budesonide (PULMICORT) nebulizer solution  0.25 mg Nebulization BID  . clonazePAM  0.5 mg Oral QHS  . diltiazem  180 mg Oral Daily  . enoxaparin (LOVENOX) injection  40 mg Subcutaneous Q24H  . escitalopram  20 mg Oral Daily  . fluconazole (DIFLUCAN) IV  100 mg Intravenous Q24H  . guaiFENesin  1,200 mg Oral BID  . ipratropium  0.5 mg Nebulization TID  . levalbuterol  1.25 mg Nebulization TID  . levofloxacin  750 mg Oral Daily  . loratadine  10 mg Oral Daily  . multivitamin with minerals  1 tablet Oral Daily  . oxybutynin  5 mg Oral Daily  . predniSONE  50 mg Oral Q  breakfast  . roflumilast  500 mcg Oral Daily  . sodium chloride  3 mL Intravenous Q12H  . sodium chloride  3 mL Intravenous Q12H   Continuous Infusions:    Pamella Pert, MD [facility] Pager [phone]. If 7 PM - 7 AM, please contact night-coverage at [email or web address], password [facility] 11/18/2014, 12:05 PM  LOS: 4 days

## 2014-11-18 NOTE — Care Management Important Message (Signed)
Important Message  Patient Details  Name: [name] MRN: [id number] Date of Birth: [birth year]   Medicare Important Message Given:  Yes-third notification given    Sherald Barge, RN 11/18/2014, 1:58 PM

## 2014-11-18 NOTE — Progress Notes (Signed)
Subjective: She says she feels a little better this morning. She thinks she is moving air better. She is still coughing mostly nonproductively  Objective: Vital signs in last 24 hours: Temp:  [96.8 F (36 C)-97.6 F (36.4 C)] 97.6 F (36.4 C) (07/15 0400) Pulse Rate:  [57-96] 80 (07/15 0600) Resp:  [8-26] 15 (07/15 0600) BP: (105-146)/(58-122) 122/68 mmHg (07/15 0600) SpO2:  [93 %-100 %] 93 % (07/15 0814) Weight:  [50.5 kg (111 lb 5.3 oz)] 50.5 kg (111 lb 5.3 oz) (07/15 0400) Weight change: -0.4 kg (-14.1 oz) Last BM Date: 11/16/14  Intake/Output from previous day: 07/14 0701 - 07/15 0700 In: 3 [I.V.:3] Out: -   PHYSICAL EXAM General appearance: alert, cooperative and no distress Resp: She is moving air better and has less of the rales and rhonchi on the right Cardio: regular rate and rhythm, S1, S2 normal, no murmur, click, rub or gallop GI: soft, non-tender; bowel sounds normal; no masses,  no organomegaly Extremities: extremities normal, atraumatic, no cyanosis or edema  Lab Results:  Results for orders placed or performed during the hospital encounter of 11/14/14 (from the past 48 hour(s))  Blood gas, arterial     Status: Abnormal   Collection Time: 11/17/14  5:35 AM  Result Value Ref Range   FIO2 35.00 %   Delivery systems BILEVEL POSITIVE AIRWAY PRESSURE    Inspiratory PAP 12    Expiratory PAP 6    pH, Arterial 7.366 7.350 - 7.450   pCO2 arterial 69.7 (HH) 35.0 - 45.0 mmHg    Comment: CRITICAL RESULT CALLED TO, READ BACK BY AND VERIFIED WITH: ROBBIE WAGONER,RN BY K KNICK RRT,RCP ON 11/17/2014 AT 0542    pO2, Arterial 86.4 80.0 - 100.0 mmHg   Bicarbonate 38.9 (H) 20.0 - 24.0 mEq/L   TCO2 35.6 0 - 100 mmol/L   Acid-Base Excess 13.1 (H) 0.0 - 2.0 mmol/L   O2 Saturation 96.3 %   Patient temperature 37.0    Collection site RIGHT RADIAL    Drawn by [id number]    Sample type ARTERIAL    Allens test (pass/fail) PASS PASS  Basic metabolic panel     Status: Abnormal   Collection Time: 11/17/14  5:48 AM  Result Value Ref Range   Sodium 143 135 - 145 mmol/L   Potassium 4.2 3.5 - 5.1 mmol/L   Chloride 96 (L) 101 - 111 mmol/L   CO2 37 (H) 22 - 32 mmol/L   Glucose, Bld 140 (H) 65 - 99 mg/dL   BUN 17 6 - 20 mg/dL   Creatinine, Ser 0.70 0.44 - 1.00 mg/dL   Calcium 8.3 (L) 8.9 - 10.3 mg/dL   GFR calc non Af Amer >60 >60 mL/min   GFR calc Af Amer >60 >60 mL/min    Comment: (NOTE) The eGFR has been calculated using the CKD EPI equation. This calculation has not been validated in all clinical situations. eGFR's persistently <60 mL/min signify possible Chronic Kidney Disease.    Anion gap 10 5 - 15  CBC     Status: Abnormal   Collection Time: 11/17/14  5:48 AM  Result Value Ref Range   WBC 17.3 (H) 4.0 - 10.5 K/uL   RBC 3.62 (L) 3.87 - 5.11 MIL/uL   Hemoglobin 11.8 (L) 12.0 - 15.0 g/dL   HCT 37.0 36.0 - 46.0 %   MCV 102.2 (H) 78.0 - 100.0 fL   MCH 32.6 26.0 - 34.0 pg   MCHC 31.9 30.0 - 36.0 g/dL  RDW 13.1 11.5 - 15.5 %   Platelets 453 (H) 150 - 400 K/uL  Basic metabolic panel     Status: Abnormal   Collection Time: 11/18/14  5:35 AM  Result Value Ref Range   Sodium 141 135 - 145 mmol/L   Potassium 3.6 3.5 - 5.1 mmol/L   Chloride 96 (L) 101 - 111 mmol/L   CO2 39 (H) 22 - 32 mmol/L   Glucose, Bld 153 (H) 65 - 99 mg/dL   BUN 19 6 - 20 mg/dL   Creatinine, Ser 0.65 0.44 - 1.00 mg/dL   Calcium 8.0 (L) 8.9 - 10.3 mg/dL   GFR calc non Af Amer >60 >60 mL/min   GFR calc Af Amer >60 >60 mL/min    Comment: (NOTE) The eGFR has been calculated using the CKD EPI equation. This calculation has not been validated in all clinical situations. eGFR's persistently <60 mL/min signify possible Chronic Kidney Disease.    Anion gap 6 5 - 15    ABGS  Recent Labs  11/17/14 0535  PHART 7.366  PO2ART 86.4  TCO2 35.6  HCO3 38.9*   CULTURES Recent Results (from the past 240 hour(s))  Culture, blood (routine x 2) Call MD if unable to obtain prior to  antibiotics being given     Status: None (Preliminary result)   Collection Time: 11/15/14 12:00 AM  Result Value Ref Range Status   Specimen Description BLOOD RIGHT ANTECUBITAL  Final   Special Requests BOTTLES DRAWN AEROBIC AND ANAEROBIC 10CC EACH  Final   Culture NO GROWTH 3 DAYS  Final   Report Status PENDING  Incomplete  Culture, blood (routine x 2) Call MD if unable to obtain prior to antibiotics being given     Status: None (Preliminary result)   Collection Time: 11/15/14 12:37 AM  Result Value Ref Range Status   Specimen Description BLOOD RIGHT ANTECUBITAL  Final   Special Requests BOTTLES DRAWN AEROBIC AND ANAEROBIC 10CC EACH  Final   Culture NO GROWTH 3 DAYS  Final   Report Status PENDING  Incomplete  MRSA PCR Screening     Status: None   Collection Time: 11/15/14  1:30 AM  Result Value Ref Range Status   MRSA by PCR NEGATIVE NEGATIVE Final    Comment:        The GeneXpert MRSA Assay (FDA approved for NASAL specimens only), is one component of a comprehensive MRSA colonization surveillance program. It is not intended to diagnose MRSA infection nor to guide or monitor treatment for MRSA infections.    Studies/Results: No results found.  Medications:  Prior to Admission:  Prescriptions prior to admission  Medication Sig Dispense Refill Last Dose  . albuterol (PROAIR HFA) 108 (90 BASE) MCG/ACT inhaler Inhale 2 puffs into the lungs every 6 (six) hours as needed. For shortness of breath   11/14/2014 at Unknown time  . ALPRAZolam (XANAX) 0.5 MG tablet Take 0.5 mg by mouth daily as needed. anxiety   Past Week at Unknown time  . aspirin 81 MG tablet Take 81 mg by mouth daily.   11/14/2014 at Unknown time  . atorvastatin (LIPITOR) 40 MG tablet Take 1 tablet (40 mg total) by mouth daily. 90 tablet 3 11/13/2014 at Unknown time  . cetirizine (ZYRTEC) 10 MG tablet Take 10 mg by mouth daily.     11/14/2014 at Unknown time  . clonazePAM (KLONOPIN) 0.5 MG tablet Take 0.5 mg by mouth at  bedtime.   11/13/2014 at Unknown time  .  diltiazem (CARDIZEM CD) 180 MG 24 hr capsule TAKE 1 CAPSULE BY MOUTH DAILY 90 capsule 3 11/14/2014 at Unknown time  . escitalopram (LEXAPRO) 20 MG tablet Take 20 mg by mouth daily.     11/14/2014 at Unknown time  . Fluticasone-Salmeterol (ADVAIR) 250-50 MCG/DOSE AEPB Inhale 1 puff into the lungs 2 (two) times daily.    11/14/2014 at Unknown time  . ibuprofen (ADVIL,MOTRIN) 200 MG tablet Take 400 mg by mouth 2 (two) times daily as needed for mild pain or moderate pain.   11/14/2014 at Unknown time  . Multiple Vitamins-Minerals (MULTIVITAMINS THER. W/MINERALS) TABS Take 1 tablet by mouth daily.     11/14/2014 at Unknown time  . oxybutynin (DITROPAN) 5 MG tablet Take 5 mg by mouth daily.    11/14/2014 at Unknown time  . predniSONE (DELTASONE) 10 MG tablet 10 mg daily.    11/14/2014 at Unknown time  . roflumilast (DALIRESP) 500 MCG TABS tablet Take 500 mcg by mouth daily.     11/14/2014 at Unknown time  . SPIRIVA RESPIMAT 2.5 MCG/ACT AERS Inhale 1 puff into the lungs daily.    11/14/2014 at Unknown time  . triamcinolone (NASACORT ALLERGY 24HR) 55 MCG/ACT AERO nasal inhaler Place 2 sprays into the nose daily as needed (FOR ALLERGIES).   Past Week at Unknown time  . fluconazole (DIFLUCAN) 150 MG tablet Take 150 mg by mouth once a week.    Taking   Scheduled: . aspirin  81 mg Oral Daily  . atorvastatin  40 mg Oral Daily  . budesonide (PULMICORT) nebulizer solution  0.25 mg Nebulization BID  . clonazePAM  0.5 mg Oral QHS  . diltiazem  180 mg Oral Daily  . enoxaparin (LOVENOX) injection  40 mg Subcutaneous Q24H  . escitalopram  20 mg Oral Daily  . fluconazole (DIFLUCAN) IV  100 mg Intravenous Q24H  . guaiFENesin  1,200 mg Oral BID  . ipratropium  0.5 mg Nebulization TID  . levalbuterol  1.25 mg Nebulization TID  . levofloxacin (LEVAQUIN) IV  750 mg Intravenous Q24H  . loratadine  10 mg Oral Daily  . methylPREDNISolone (SOLU-MEDROL) injection  80 mg Intravenous Q8H  .  multivitamin with minerals  1 tablet Oral Daily  . oxybutynin  5 mg Oral Daily  . roflumilast  500 mcg Oral Daily  . sodium chloride  3 mL Intravenous Q12H  . sodium chloride  3 mL Intravenous Q12H   Continuous:  OEV:[id number] chloride, acetaminophen **OR** acetaminophen, ALPRAZolam, fluticasone  Assesment: She was admitted with acute on chronic hypercapnic and hypoxic respiratory failure on the basis of COPD exacerbation and pneumonia. She is improving. She still has nonproductive cough. She still uses BiPAP at night and during the day when she naps Active Problems:   Respiratory failure with hypercapnia   COPD exacerbation   Anemia   Tachycardia   Acute respiratory failure with hypercapnia    Plan: Continue current treatments. Discussed with Dr.Gherghe and he feels she may be able to go home tomorrow. I think that is appropriate    LOS: 4 days   [name] L 11/18/2014, 8:17 AM

## 2014-11-18 NOTE — Care Management Note (Signed)
Case Management Note  Patient Details  Name: [name] MRN: [id number] Date of Birth: 1955-08-16  Expected Discharge Date:     11/20/2014             Expected Discharge Plan:  [place]  In-House Referral:  NA  Discharge planning Services  CM Consult  Post Acute Care Choice:  Durable Medical Equipment, Home Health Choice offered to:  Patient  DME Arranged:  3-N-1, Walker rolling DME Agency:  [place]:  RN, PT [facility] Agency:  [place]  Status of Service:  Completed, signed off  Medicare Important Message Given:  Yes-second notification given Date Medicare IM Given:    Medicare IM give by:    Date Additional Medicare IM Given:    Additional Medicare Important Message give by:     If discussed at [place] of Stay Meetings, dates discussed:    Additional Comments: Pt anticipates DC home over weekend. Pt will need HH RN/PT. Pt has chosen [facility] for [facility] needs. Romualdo Bolk, of [facility] aware of discharge plan and will obtain pt info from chart. Pt aware HH has 48 hours to make first visit. Pt's weekend RN will notify [facility] of DC. No further CM needs.   Sherald Barge, RN 11/18/2014, 1:56 PM

## 2014-11-18 NOTE — Progress Notes (Signed)
PHARMACIST - PHYSICIAN COMMUNICATION DR:   Cruzita Lederer CONCERNING: Antibiotic IV to Oral Route Change Policy  RECOMMENDATION: This patient is receiving Diflucan by the intravenous route.  Based on criteria approved by the Pharmacy and Therapeutics Committee, the antibiotic(s) is/are being converted to the equivalent oral dose form(s).   DESCRIPTION: These criteria include:  Patient being treated for a respiratory tract infection, urinary tract infection, cellulitis or clostridium difficile associated diarrhea if on metronidazole  The patient is not neutropenic and does not exhibit a GI malabsorption state  The patient is eating (either orally or via tube) and/or has been taking other orally administered medications for a least 24 hours  The patient is improving clinically and has a Tmax < 100.5  If you have questions about this conversion, please contact the Pharmacy Department  [x]   [phone] )  [facility] []   [phone] )  [facility] []   [phone] )  [facility] []   [phone] )  [facility] []   [phone] )  [facility]    S. [place] Crane, PharmD

## 2014-11-19 DIAGNOSIS — J9620 Acute and chronic respiratory failure, unspecified whether with hypoxia or hypercapnia: Secondary | ICD-10-CM | POA: Diagnosis present

## 2014-11-19 DIAGNOSIS — B370 Candidal stomatitis: Secondary | ICD-10-CM | POA: Diagnosis present

## 2014-11-19 DIAGNOSIS — J189 Pneumonia, unspecified organism: Secondary | ICD-10-CM | POA: Diagnosis present

## 2014-11-19 DIAGNOSIS — J13 Pneumonia due to Streptococcus pneumoniae: Secondary | ICD-10-CM | POA: Diagnosis present

## 2014-11-19 MED ORDER — CETYLPYRIDINIUM CHLORIDE 0.05 % MT LIQD: 7.0000 mL | Freq: Two times a day (BID) | OROMUCOSAL | Status: DC

## 2014-11-19 MED ORDER — LEVOFLOXACIN 750 MG PO TABS: 750.0000 mg | ORAL_TABLET | Freq: Every day | ORAL | Status: DC

## 2014-11-19 MED ORDER — FLUCONAZOLE 100 MG PO TABS: 100.0000 mg | ORAL_TABLET | Freq: Every day | ORAL | Status: DC

## 2014-11-19 MED ORDER — PREDNISONE 10 MG PO TABS: 10.0000 mg | ORAL_TABLET | Freq: Every day | ORAL | Status: DC

## 2014-11-19 MED ORDER — CHLORHEXIDINE GLUCONATE 0.12 % MT SOLN: 15.0000 mL | Freq: Two times a day (BID) | OROMUCOSAL | Status: DC

## 2014-11-19 NOTE — Discharge Instructions (Signed)
Follow with Glo Herring., MD in 5-7 days  Please get a complete blood count and chemistry panel checked by your Primary MD at your next visit, and again as instructed by your Primary MD. Please get your medications reviewed and adjusted by your Primary MD.  Please request your Primary MD to go over all Hospital Tests and Procedure/Radiological results at the follow up, please get all Hospital records sent to your Prim MD by signing hospital release before you go home.  If you had Pneumonia of Lung problems at the Hospital: Please get a 2 view Chest X ray done in 6-8 weeks after hospital discharge or sooner if instructed by your Primary MD.  If you have Congestive Heart Failure: Please call your Cardiologist or Primary MD anytime you have any of the following symptoms:  1) 3 pound weight gain in 24 hours or 5 pounds in 1 week  2) shortness of breath, with or without a dry hacking cough  3) swelling in the hands, feet or stomach  4) if you have to sleep on extra pillows at night in order to breathe  Follow cardiac low salt diet and 1.5 lit/day fluid restriction.  If you have diabetes Accuchecks 4 times/day, Once in AM empty stomach and then before each meal. Log in all results and show them to your primary doctor at your next visit. If any glucose reading is under 80 or above 300 call your primary MD immediately.  If you have Seizure/Convulsions/Epilepsy: Please do not drive, operate heavy machinery, participate in activities at heights or participate in high speed sports until you have seen by Primary MD or a Neurologist and advised to do so again.  If you had Gastrointestinal Bleeding: Please ask your Primary MD to check a complete blood count within one week of discharge or at your next visit. Your endoscopic/colonoscopic biopsies that are pending at the time of discharge, will also need to followed by your Primary MD.  Get Medicines reviewed and adjusted. Please take all your  medications with you for your next visit with your Primary MD  Please request your Primary MD to go over all hospital tests and procedure/radiological results at the follow up, please ask your Primary MD to get all Hospital records sent to his/her office.  If you experience worsening of your admission symptoms, develop shortness of breath, life threatening emergency, suicidal or homicidal thoughts you must seek medical attention immediately by calling 911 or calling your MD immediately  if symptoms less severe.  You must read complete instructions/literature along with all the possible adverse reactions/side effects for all the Medicines you take and that have been prescribed to you. Take any new Medicines after you have completely understood and accpet all the possible adverse reactions/side effects.   Do not drive or operate heavy machinery when taking Pain medications.   Do not take more than prescribed Pain, Sleep and Anxiety Medications  Special Instructions: If you have smoked or chewed Tobacco  in the last 2 yrs please stop smoking, stop any regular Alcohol  and or any Recreational drug use.  Wear Seat belts while driving.  Please note You were cared for by a hospitalist during your hospital stay. If you have any questions about your discharge medications or the care you received while you were in the hospital after you are discharged, you can call the unit and asked to speak with the hospitalist on call if the hospitalist that took care of you is not available. Once  you are discharged, your primary care physician will handle any further medical issues. Please note that NO REFILLS for any discharge medications will be authorized once you are discharged, as it is imperative that you return to your primary care physician (or establish a relationship with a primary care physician if you do not have one) for your aftercare needs so that they can reassess your need for medications and monitor your  lab values.  You can reach the hospitalist office at phone [phone] or fax [phone]   If you do not have a primary care physician, you can call [phone] for a physician referral.  Activity: As tolerated with Full fall precautions use walker/cane & assistance as needed  Diet: regular  Disposition Home

## 2014-11-19 NOTE — Progress Notes (Signed)
DISCHARGE INSTRUCTION AND PRESCRIPTIONS GIVEN.[facility] REFERRAL.PT IS WAITING FOR SISTER TO COME AND PICK HER UP TO GO HOME

## 2014-11-19 NOTE — Progress Notes (Signed)
Pt discharged home on o2

## 2014-11-19 NOTE — Discharge Summary (Signed)
Physician Discharge Summary  [name] GNF:[id number] DOB: [birth year] DOA: 11/14/2014  PCP: Cassell Smiles., MD  Admit date: 11/14/2014 Discharge date: 11/19/2014  Time spent: > 30 minutes  Recommendations for Outpatient Follow-up:  1. Follow up with Dr. Sherwood Gambler in 2-4 weeks 2. Follow up with Primary Pulmonologist in [place] in 1 week   Discharge Diagnoses:  Principal Problem:   COPD exacerbation Active Problems:   Anemia   Tachycardia   CAP (community acquired pneumonia)   Acute on chronic respiratory failure   Streptococcus pneumoniae pneumonia   Thrush, oral  Discharge Condition: stable  Diet recommendation: regular  Filed Weights   11/17/14 0500 11/18/14 0400 11/19/14 0400  Weight: 50.9 kg (112 lb 3.4 oz) 50.5 kg (111 lb 5.3 oz) 51.5 kg (113 lb 8.6 oz)   History of present illness:  Per Dr. Selena Batten, 59 yo female with Copd on home o2, apparently c/o dyspnea x 1 week. + fever, + cough (nonproductive), Denies cp, palp, n/v, diarrhea, brbpr, black stool. Some right sided pain at time. Denies dysuria, hematuria. Pt came to ER because she was tired of being more sob. Pt was noted to have leukocytosis, and tachycardic. CXR => Emphysema with right lower lung field subsegmental atelectasis and possible small right pleural effusion. Superimposed pneumonia is not excluded. Clinical correlation and follow-up recommended stop.  Hospital Course:  Community acquired pneumonia, strep pneumonia positive - patient febrile at home, CXR with possible pneumonia and with positive strep pneumoniae. Patient was started on levofloxacin with subsequent improvement in her respiratory status. She has received 6 days of Levofloxacin and was prescribed 3 additional days on discharge.  COPD exacerbation - initially on IV steroids, transitioned to po prednisone, was prescribed a taper on discharge. Pulmonology was consulted and have followed patient while hospitalized.  Acute on chronic  respiratory failure with hypoxia and hypercapnia - in the setting of #1 and #2, VQ scan low probability for PE. Anemia - stable, no evidence of bleeding Oral thrush - due to home inhalers, Fluconazole for 9 additional days to complete 14 day course.  Procedures:  None    Consultations:  Pulmonology  Discharge Exam: Filed Vitals:   11/19/14 0700 11/19/14 0800 11/19/14 0848 11/19/14 0900  BP: 122/72 135/60  145/67  Pulse: 67 67  88  Temp:      TempSrc:      Resp: 18 24  20   Height:      Weight:      SpO2: 98% 96% 97% 97%   General: NAD Cardiovascular: RRR Respiratory: overall decreased breath sounds, no wheezing  Discharge Instructions     Medication List    TAKE these medications        ALPRAZolam 0.5 MG tablet  Commonly known as:  XANAX  Take 0.5 mg by mouth daily as needed. anxiety     aspirin 81 MG tablet  Take 81 mg by mouth daily.     atorvastatin 40 MG tablet  Commonly known as:  LIPITOR  Take 1 tablet (40 mg total) by mouth daily.     cetirizine 10 MG tablet  Commonly known as:  ZYRTEC  Take 10 mg by mouth daily.     clonazePAM 0.5 MG tablet  Commonly known as:  KLONOPIN  Take 0.5 mg by mouth at bedtime.     DALIRESP 500 MCG Tabs tablet  Generic drug:  roflumilast  Take 500 mcg by mouth daily.     diltiazem 180 MG 24 hr capsule  Commonly  known as:  CARDIZEM CD  TAKE 1 CAPSULE BY MOUTH DAILY     escitalopram 20 MG tablet  Commonly known as:  LEXAPRO  Take 20 mg by mouth daily.     fluconazole 150 MG tablet  Commonly known as:  DIFLUCAN  Take 150 mg by mouth once a week.     fluconazole 100 MG tablet  Commonly known as:  DIFLUCAN  Take 1 tablet (100 mg total) by mouth daily.     Fluticasone-Salmeterol 250-50 MCG/DOSE Aepb  Commonly known as:  ADVAIR  Inhale 1 puff into the lungs 2 (two) times daily.     ibuprofen 200 MG tablet  Commonly known as:  ADVIL,MOTRIN  Take 400 mg by mouth 2 (two) times daily as needed for mild pain or  moderate pain.     levofloxacin 750 MG tablet  Commonly known as:  LEVAQUIN  Take 1 tablet (750 mg total) by mouth daily.     multivitamins ther. w/minerals Tabs tablet  Take 1 tablet by mouth daily.     NASACORT ALLERGY 24HR 55 MCG/ACT Aero nasal inhaler  Generic drug:  triamcinolone  Place 2 sprays into the nose daily as needed (FOR ALLERGIES).     oxybutynin 5 MG tablet  Commonly known as:  DITROPAN  Take 5 mg by mouth daily.     predniSONE 10 MG tablet  Commonly known as:  DELTASONE  Take 1 tablet (10 mg total) by mouth daily. Take 40 mg x 3 days then 30 mg x 3 days then 20 mg x 3 days then resume maintenance dose 10 mg daily     PROAIR HFA 108 (90 BASE) MCG/ACT inhaler  Generic drug:  albuterol  Inhale 2 puffs into the lungs every 6 (six) hours as needed. For shortness of breath     SPIRIVA RESPIMAT 2.5 MCG/ACT Aers  Generic drug:  Tiotropium Bromide Monohydrate  Inhale 1 puff into the lungs daily.           Follow-up Information    Follow up with [facility].   Contact information:   [address] [place] [address] [phone]       Follow up with [facility]. Schedule an appointment as soon as possible for a visit in 1 week.      The results of significant diagnostics from this hospitalization (including imaging, microbiology, ancillary and laboratory) are listed below for reference.    Significant Diagnostic Studies: Ct Abdomen Pelvis Wo Contrast  11/15/2014   CLINICAL DATA:  59 year old female with COPD presenting with dyspnea fever and cough  EXAM: CT ABDOMEN AND PELVIS WITHOUT CONTRAST  TECHNIQUE: Multidetector CT imaging of the abdomen and pelvis was performed following the standard protocol without IV contrast.  COMPARISON:  CT dated 04/08/2013  FINDINGS: Evaluation of this exam is limited in the absence of intravenous contrast.  There is emphysematous changes of the right lung base with an area of  consolidation and air bronchograms compatible with pneumonia.  No intra-abdominal free air or free fluid.  The liver, gallbladder, pancreas, spleen, adrenal glands, kidneys, visualized ureters, and urinary bladder appear unremarkable. A punctate calcific density at the base of the bladder (series 2, image 62 and sagittal image 49) may represent a urethral calculus or focal calcification of the bladder floor.  The uterus is grossly unremarkable.  There is sigmoid diverticulosis with muscular hypertrophy. No active inflammation. Constipation. No bowel obstruction. Normal appendix.  There is aortoiliac atherosclerotic disease. The  small fat containing umbilical hernia for degenerative changes of the spine. The  IMPRESSION: Large right lung base consolidation and air bronchogram compatible with pneumonia. Clinical correlation and follow-up to resolution recommended.  Punctate calcific density at the floor of the bladder may represent a urethral calculus versus focal calcification of the bladder floor. There is no hydronephrosis.  Constipation. No evidence of bowel obstruction or inflammation. Normal appendix.  Sigmoid diverticulosis.   Electronically Signed   By: Elgie Collard M.D.   On: 11/15/2014 00:46   Nm Pulmonary Perf And Vent  11/15/2014   CLINICAL DATA:  Chronic shortness of breath. Three-day history of chest pain.  EXAM: NUCLEAR MEDICINE VENTILATION - PERFUSION LUNG SCAN  Views: Anterior, posterior, left lateral, right lateral, RPO, LPO, RAO, LAO -ventilation and perfusion  Radionuclide: Technetium 67m DTPA-ventilation; Technetium 27m macroaggregated albumin-perfusion  Dose:  38.9 mCi-ventilation; 6.0 mCi-perfusion  Route of administration: Inhalation -ventilation ; intravenous -perfusion  COMPARISON:  Chest radiograph November 14, 2014  FINDINGS: Ventilation: There is near absence of ventilation on the right. Chest radiograph shows extensive bullous disease throughout the right upper lung zone as well as  extensive infiltrate throughout the remainder of the right lung. Radiotracer uptake on the left is normal except for a few minimal peripheral ventilation defects.  Perfusion: There is absence of perfusion in the area of the bullous disease in the right upper lobe which corresponds to absence of ventilation in this area. There is no other appreciable perfusion defect. There is significantly better perfusion of the right mid and lower lung zones than ventilation in the right mid and lower lung zones. On the left, there are no perfusion defects.  IMPRESSION: There is a matching ventilation and perfusion defect in the right upper lobe which corresponds to extensive bullous disease seen on radiographic examination. Elsewhere, perfusion is otherwise essentially unremarkable. Note that there is near complete absence of ventilation in the right mid and lower lung zones with significantly better perfusion in these areas. This study constitutes an overall low probability of pulmonary embolus given that the sizable defect in the right upper lobe can be accounted for by bullous disease, documented on chest radiographic examination.   Electronically Signed   By: Bretta Bang III M.D.   On: 11/15/2014 11:48   Dg Chest Portable 1 View  11/14/2014   CLINICAL DATA:  59 year old female with shortness of breath  EXAM: PORTABLE CHEST - 1 VIEW  COMPARISON:  None.  FINDINGS: Single-view of the chest demonstrate emphysematous changes with hyperinflation of the lungs. Bilateral lower lung field linear atelectasis/ scarring noted. There is blunting of the right costophrenic angles which may be related to atelectatic changes or a small pleural effusion. Mild cardiomegaly. The osseous structures are grossly unremarkable. Anterior cervical fixation plate and screws noted.  IMPRESSION: Emphysema with right lower lung field subsegmental atelectasis and possible small right pleural effusion. Superimposed pneumonia is not excluded.  Clinical correlation and follow-up recommended stop.   Electronically Signed   By: Elgie Collard M.D.   On: 11/14/2014 21:07    Microbiology: Recent Results (from the past 240 hour(s))  Culture, blood (routine x 2) Call MD if unable to obtain prior to antibiotics being given     Status: None (Preliminary result)   Collection Time: 11/15/14 12:00 AM  Result Value Ref Range Status   Specimen Description BLOOD RIGHT ANTECUBITAL  Final   Special Requests BOTTLES DRAWN AEROBIC AND ANAEROBIC 10CC EACH  Final   Culture NO GROWTH 3 DAYS  Final   Report Status PENDING  Incomplete  Culture, blood (routine x 2) Call MD if unable to obtain prior to antibiotics being given     Status: None (Preliminary result)   Collection Time: 11/15/14 12:37 AM  Result Value Ref Range Status   Specimen Description BLOOD RIGHT ANTECUBITAL  Final   Special Requests BOTTLES DRAWN AEROBIC AND ANAEROBIC 10CC EACH  Final   Culture NO GROWTH 3 DAYS  Final   Report Status PENDING  Incomplete  MRSA PCR Screening     Status: None   Collection Time: 11/15/14  1:30 AM  Result Value Ref Range Status   MRSA by PCR NEGATIVE NEGATIVE Final    Comment:        The GeneXpert MRSA Assay (FDA approved for NASAL specimens only), is one component of a comprehensive MRSA colonization surveillance program. It is not intended to diagnose MRSA infection nor to guide or monitor treatment for MRSA infections.      Labs: Basic Metabolic Panel:  Recent Labs Lab 11/14/14 2030 11/15/14 0540 11/16/14 0549 11/17/14 0548 11/18/14 0535  NA 141 139 142 143 141  K 3.9 4.1 4.1 4.2 3.6  CL 93* 96* 99* 96* 96*  CO2 36* 34* 35* 37* 39*  GLUCOSE 161* 142* 149* 140* 153*  BUN 11 11 13 17 19   CREATININE 0.80 0.64 0.63 0.70 0.65  CALCIUM 8.9 8.3* 8.3* 8.3* 8.0*   Liver Function Tests:  Recent Labs Lab 11/14/14 2030 11/15/14 0540  AST 26 19  ALT 21 19  ALKPHOS 129* 110  BILITOT 0.5 0.4  PROT 7.6 6.6  ALBUMIN 3.1* 2.6*    No results for input(s): LIPASE, AMYLASE in the last 168 hours. No results for input(s): AMMONIA in the last 168 hours. CBC:  Recent Labs Lab 11/14/14 2030 11/15/14 0540 11/17/14 0548  WBC 24.0* 19.8* 17.3*  NEUTROABS 22.5* 19.4*  --   HGB 11.9* 10.4* 11.8*  HCT 36.9 32.5* 37.0  MCV 101.4* 101.9* 102.2*  PLT 320 299 453*   Cardiac Enzymes:  Recent Labs Lab 11/14/14 2030 11/15/14 0037 11/15/14 0540 11/15/14 1220  TROPONINI <0.03 <0.03 <0.03 <0.03   BNP: BNP (last 3 results)  Recent Labs  11/14/14 2030  BNP 104.0*     Signed:  GHERGHE, COSTIN  [facility] 11/19/2014, 1:54 PM

## 2014-11-19 NOTE — Progress Notes (Signed)
Subjective: She says she feels better. She is still short of breath. She feels like she is approaching baseline. She still has to use BiPAP fairly frequently. She had PT treatment yesterday and was able to walk about 175 feet but became very fatigued  Objective: Vital signs in last 24 hours: Temp:  [97.5 F (36.4 C)-98.6 F (37 C)] 97.5 F (36.4 C) (07/16 0400) Pulse Rate:  [60-101] 71 (07/16 0600) Resp:  [14-27] 23 (07/16 0600) BP: (101-151)/(52-113) 133/61 mmHg (07/16 0600) SpO2:  [90 %-100 %] 97 % (07/16 0600) Weight:  [51.5 kg (113 lb 8.6 oz)] 51.5 kg (113 lb 8.6 oz) (07/16 0400) Weight change: 1 kg (2 lb 3.3 oz) Last BM Date: 11/18/14  Intake/Output from previous day: 07/15 0701 - 07/16 0700 In: 1453 [P.O.:1200; I.V.:3; IV Piggyback:250] Out: 1275 [Urine:1275]  PHYSICAL EXAM General appearance: alert, cooperative and no distress Resp: rhonchi bilaterally Cardio: regular rate and rhythm, S1, S2 normal, no murmur, click, rub or gallop GI: soft, non-tender; bowel sounds normal; no masses,  no organomegaly Extremities: extremities normal, atraumatic, no cyanosis or edema  Lab Results:  Results for orders placed or performed during the hospital encounter of 11/14/14 (from the past 48 hour(s))  Basic metabolic panel     Status: Abnormal   Collection Time: 11/18/14  5:35 AM  Result Value Ref Range   Sodium 141 135 - 145 mmol/L   Potassium 3.6 3.5 - 5.1 mmol/L   Chloride 96 (L) 101 - 111 mmol/L   CO2 39 (H) 22 - 32 mmol/L   Glucose, Bld 153 (H) 65 - 99 mg/dL   BUN 19 6 - 20 mg/dL   Creatinine, Ser 0.65 0.44 - 1.00 mg/dL   Calcium 8.0 (L) 8.9 - 10.3 mg/dL   GFR calc non Af Amer >60 >60 mL/min   GFR calc Af Amer >60 >60 mL/min    Comment: (NOTE) The eGFR has been calculated using the CKD EPI equation. This calculation has not been validated in all clinical situations. eGFR's persistently <60 mL/min signify possible Chronic Kidney Disease.    Anion gap 6 5 - 15     ABGS  Recent Labs  11/17/14 0535  PHART 7.366  PO2ART 86.4  TCO2 35.6  HCO3 38.9*   CULTURES Recent Results (from the past 240 hour(s))  Culture, blood (routine x 2) Call MD if unable to obtain prior to antibiotics being given     Status: None (Preliminary result)   Collection Time: 11/15/14 12:00 AM  Result Value Ref Range Status   Specimen Description BLOOD RIGHT ANTECUBITAL  Final   Special Requests BOTTLES DRAWN AEROBIC AND ANAEROBIC 10CC EACH  Final   Culture NO GROWTH 3 DAYS  Final   Report Status PENDING  Incomplete  Culture, blood (routine x 2) Call MD if unable to obtain prior to antibiotics being given     Status: None (Preliminary result)   Collection Time: 11/15/14 12:37 AM  Result Value Ref Range Status   Specimen Description BLOOD RIGHT ANTECUBITAL  Final   Special Requests BOTTLES DRAWN AEROBIC AND ANAEROBIC 10CC EACH  Final   Culture NO GROWTH 3 DAYS  Final   Report Status PENDING  Incomplete  MRSA PCR Screening     Status: None   Collection Time: 11/15/14  1:30 AM  Result Value Ref Range Status   MRSA by PCR NEGATIVE NEGATIVE Final    Comment:        The GeneXpert MRSA Assay (FDA approved for NASAL  specimens only), is one component of a comprehensive MRSA colonization surveillance program. It is not intended to diagnose MRSA infection nor to guide or monitor treatment for MRSA infections.    Studies/Results: No results found.  Medications:  Prior to Admission:  Prescriptions prior to admission  Medication Sig Dispense Refill Last Dose  . albuterol (PROAIR HFA) 108 (90 BASE) MCG/ACT inhaler Inhale 2 puffs into the lungs every 6 (six) hours as needed. For shortness of breath   11/14/2014 at Unknown time  . ALPRAZolam (XANAX) 0.5 MG tablet Take 0.5 mg by mouth daily as needed. anxiety   Past Week at Unknown time  . aspirin 81 MG tablet Take 81 mg by mouth daily.   11/14/2014 at Unknown time  . atorvastatin (LIPITOR) 40 MG tablet Take 1 tablet (40  mg total) by mouth daily. 90 tablet 3 11/13/2014 at Unknown time  . cetirizine (ZYRTEC) 10 MG tablet Take 10 mg by mouth daily.     11/14/2014 at Unknown time  . clonazePAM (KLONOPIN) 0.5 MG tablet Take 0.5 mg by mouth at bedtime.   11/13/2014 at Unknown time  . diltiazem (CARDIZEM CD) 180 MG 24 hr capsule TAKE 1 CAPSULE BY MOUTH DAILY 90 capsule 3 11/14/2014 at Unknown time  . escitalopram (LEXAPRO) 20 MG tablet Take 20 mg by mouth daily.     11/14/2014 at Unknown time  . Fluticasone-Salmeterol (ADVAIR) 250-50 MCG/DOSE AEPB Inhale 1 puff into the lungs 2 (two) times daily.    11/14/2014 at Unknown time  . ibuprofen (ADVIL,MOTRIN) 200 MG tablet Take 400 mg by mouth 2 (two) times daily as needed for mild pain or moderate pain.   11/14/2014 at Unknown time  . Multiple Vitamins-Minerals (MULTIVITAMINS THER. W/MINERALS) TABS Take 1 tablet by mouth daily.     11/14/2014 at Unknown time  . oxybutynin (DITROPAN) 5 MG tablet Take 5 mg by mouth daily.    11/14/2014 at Unknown time  . predniSONE (DELTASONE) 10 MG tablet 10 mg daily.    11/14/2014 at Unknown time  . roflumilast (DALIRESP) 500 MCG TABS tablet Take 500 mcg by mouth daily.     11/14/2014 at Unknown time  . SPIRIVA RESPIMAT 2.5 MCG/ACT AERS Inhale 1 puff into the lungs daily.    11/14/2014 at Unknown time  . triamcinolone (NASACORT ALLERGY 24HR) 55 MCG/ACT AERO nasal inhaler Place 2 sprays into the nose daily as needed (FOR ALLERGIES).   Past Week at Unknown time  . fluconazole (DIFLUCAN) 150 MG tablet Take 150 mg by mouth once a week.    Taking   Scheduled: . aspirin  81 mg Oral Daily  . atorvastatin  40 mg Oral Daily  . budesonide (PULMICORT) nebulizer solution  0.25 mg Nebulization BID  . clonazePAM  0.5 mg Oral QHS  . diltiazem  180 mg Oral Daily  . enoxaparin (LOVENOX) injection  40 mg Subcutaneous Q24H  . escitalopram  20 mg Oral Daily  . fluconazole  100 mg Oral Daily  . guaiFENesin  1,200 mg Oral BID  . ipratropium  0.5 mg Nebulization TID  .  levalbuterol  1.25 mg Nebulization TID  . levofloxacin  750 mg Oral Daily  . loratadine  10 mg Oral Daily  . multivitamin with minerals  1 tablet Oral Daily  . oxybutynin  5 mg Oral Daily  . predniSONE  50 mg Oral Q breakfast  . roflumilast  500 mcg Oral Daily  . sodium chloride  3 mL Intravenous Q12H  . sodium chloride  3 mL Intravenous Q12H   Continuous:  AUE:BVPLWU chloride, acetaminophen **OR** acetaminophen, ALPRAZolam, fluticasone  Assesment: She has COPD exacerbation and acute on chronic hypercapnic and hypoxic respiratory failure. She is improving. She seems to be approaching baseline. Active Problems:   Respiratory failure with hypercapnia   COPD exacerbation   Anemia   Tachycardia   Acute respiratory failure with hypercapnia    Plan: From a strictly pulmonary point of view I think she is okay to go home. She had done pulmonary rehabilitation about 2 years ago. She is going to do physical therapy at home and then she is going to consider whether she wants to try to do pulmonary rehabilitation again. She said she had a lot of days that she could not participate because of her COPD.    LOS: 5 days   Kailash Hinze L 11/19/2014, 8:36 AM

## 2014-11-20 DIAGNOSIS — J441 Chronic obstructive pulmonary disease with (acute) exacerbation: Secondary | ICD-10-CM | POA: Diagnosis not present

## 2014-11-20 DIAGNOSIS — J9622 Acute and chronic respiratory failure with hypercapnia: Secondary | ICD-10-CM | POA: Diagnosis not present

## 2014-11-20 DIAGNOSIS — J189 Pneumonia, unspecified organism: Secondary | ICD-10-CM | POA: Diagnosis not present

## 2014-11-20 DIAGNOSIS — Z9981 Dependence on supplemental oxygen: Secondary | ICD-10-CM | POA: Diagnosis not present

## 2014-11-20 DIAGNOSIS — Z87891 Personal history of nicotine dependence: Secondary | ICD-10-CM | POA: Diagnosis not present

## 2014-11-20 DIAGNOSIS — D649 Anemia, unspecified: Secondary | ICD-10-CM | POA: Diagnosis not present

## 2014-11-21 LAB — CULTURE, BLOOD (ROUTINE X 2)
CULTURE: NO GROWTH
CULTURE: NO GROWTH

## 2014-11-22 DIAGNOSIS — J9622 Acute and chronic respiratory failure with hypercapnia: Secondary | ICD-10-CM | POA: Diagnosis not present

## 2014-11-22 DIAGNOSIS — Z87891 Personal history of nicotine dependence: Secondary | ICD-10-CM | POA: Diagnosis not present

## 2014-11-22 DIAGNOSIS — J441 Chronic obstructive pulmonary disease with (acute) exacerbation: Secondary | ICD-10-CM | POA: Diagnosis not present

## 2014-11-22 DIAGNOSIS — J189 Pneumonia, unspecified organism: Secondary | ICD-10-CM | POA: Diagnosis not present

## 2014-11-22 DIAGNOSIS — Z9981 Dependence on supplemental oxygen: Secondary | ICD-10-CM | POA: Diagnosis not present

## 2014-11-22 DIAGNOSIS — D649 Anemia, unspecified: Secondary | ICD-10-CM | POA: Diagnosis not present

## 2014-11-24 DIAGNOSIS — J9611 Chronic respiratory failure with hypoxia: Secondary | ICD-10-CM | POA: Diagnosis not present

## 2014-11-24 DIAGNOSIS — J13 Pneumonia due to Streptococcus pneumoniae: Secondary | ICD-10-CM | POA: Diagnosis not present

## 2014-11-24 DIAGNOSIS — J449 Chronic obstructive pulmonary disease, unspecified: Secondary | ICD-10-CM | POA: Diagnosis not present

## 2014-11-24 DIAGNOSIS — B370 Candidal stomatitis: Secondary | ICD-10-CM | POA: Diagnosis not present

## 2014-11-24 DIAGNOSIS — G4733 Obstructive sleep apnea (adult) (pediatric): Secondary | ICD-10-CM | POA: Diagnosis not present

## 2014-11-24 DIAGNOSIS — G4752 REM sleep behavior disorder: Secondary | ICD-10-CM | POA: Diagnosis not present

## 2014-11-25 DIAGNOSIS — Z9981 Dependence on supplemental oxygen: Secondary | ICD-10-CM | POA: Diagnosis not present

## 2014-11-25 DIAGNOSIS — Z87891 Personal history of nicotine dependence: Secondary | ICD-10-CM | POA: Diagnosis not present

## 2014-11-25 DIAGNOSIS — J9622 Acute and chronic respiratory failure with hypercapnia: Secondary | ICD-10-CM | POA: Diagnosis not present

## 2014-11-25 DIAGNOSIS — J441 Chronic obstructive pulmonary disease with (acute) exacerbation: Secondary | ICD-10-CM | POA: Diagnosis not present

## 2014-11-25 DIAGNOSIS — J189 Pneumonia, unspecified organism: Secondary | ICD-10-CM | POA: Diagnosis not present

## 2014-11-25 DIAGNOSIS — D649 Anemia, unspecified: Secondary | ICD-10-CM | POA: Diagnosis not present

## 2014-11-28 DIAGNOSIS — J9622 Acute and chronic respiratory failure with hypercapnia: Secondary | ICD-10-CM | POA: Diagnosis not present

## 2014-11-28 DIAGNOSIS — J189 Pneumonia, unspecified organism: Secondary | ICD-10-CM | POA: Diagnosis not present

## 2014-11-28 DIAGNOSIS — Z87891 Personal history of nicotine dependence: Secondary | ICD-10-CM | POA: Diagnosis not present

## 2014-11-28 DIAGNOSIS — Z9981 Dependence on supplemental oxygen: Secondary | ICD-10-CM | POA: Diagnosis not present

## 2014-11-28 DIAGNOSIS — J441 Chronic obstructive pulmonary disease with (acute) exacerbation: Secondary | ICD-10-CM | POA: Diagnosis not present

## 2014-11-28 DIAGNOSIS — D649 Anemia, unspecified: Secondary | ICD-10-CM | POA: Diagnosis not present

## 2014-11-29 DIAGNOSIS — J9622 Acute and chronic respiratory failure with hypercapnia: Secondary | ICD-10-CM | POA: Diagnosis not present

## 2014-11-29 DIAGNOSIS — Z9981 Dependence on supplemental oxygen: Secondary | ICD-10-CM | POA: Diagnosis not present

## 2014-11-29 DIAGNOSIS — Z87891 Personal history of nicotine dependence: Secondary | ICD-10-CM | POA: Diagnosis not present

## 2014-11-29 DIAGNOSIS — D649 Anemia, unspecified: Secondary | ICD-10-CM | POA: Diagnosis not present

## 2014-11-29 DIAGNOSIS — J441 Chronic obstructive pulmonary disease with (acute) exacerbation: Secondary | ICD-10-CM | POA: Diagnosis not present

## 2014-11-29 DIAGNOSIS — J189 Pneumonia, unspecified organism: Secondary | ICD-10-CM | POA: Diagnosis not present

## 2014-11-30 DIAGNOSIS — D649 Anemia, unspecified: Secondary | ICD-10-CM | POA: Diagnosis not present

## 2014-11-30 DIAGNOSIS — J9622 Acute and chronic respiratory failure with hypercapnia: Secondary | ICD-10-CM | POA: Diagnosis not present

## 2014-11-30 DIAGNOSIS — J189 Pneumonia, unspecified organism: Secondary | ICD-10-CM | POA: Diagnosis not present

## 2014-11-30 DIAGNOSIS — Z9981 Dependence on supplemental oxygen: Secondary | ICD-10-CM | POA: Diagnosis not present

## 2014-11-30 DIAGNOSIS — Z87891 Personal history of nicotine dependence: Secondary | ICD-10-CM | POA: Diagnosis not present

## 2014-11-30 DIAGNOSIS — J441 Chronic obstructive pulmonary disease with (acute) exacerbation: Secondary | ICD-10-CM | POA: Diagnosis not present

## 2014-12-01 DIAGNOSIS — Z87891 Personal history of nicotine dependence: Secondary | ICD-10-CM | POA: Diagnosis not present

## 2014-12-01 DIAGNOSIS — J441 Chronic obstructive pulmonary disease with (acute) exacerbation: Secondary | ICD-10-CM | POA: Diagnosis not present

## 2014-12-01 DIAGNOSIS — D649 Anemia, unspecified: Secondary | ICD-10-CM | POA: Diagnosis not present

## 2014-12-01 DIAGNOSIS — J9622 Acute and chronic respiratory failure with hypercapnia: Secondary | ICD-10-CM | POA: Diagnosis not present

## 2014-12-01 DIAGNOSIS — J189 Pneumonia, unspecified organism: Secondary | ICD-10-CM | POA: Diagnosis not present

## 2014-12-01 DIAGNOSIS — Z9981 Dependence on supplemental oxygen: Secondary | ICD-10-CM | POA: Diagnosis not present

## 2014-12-02 DIAGNOSIS — J441 Chronic obstructive pulmonary disease with (acute) exacerbation: Secondary | ICD-10-CM | POA: Diagnosis not present

## 2014-12-02 DIAGNOSIS — Z87891 Personal history of nicotine dependence: Secondary | ICD-10-CM | POA: Diagnosis not present

## 2014-12-02 DIAGNOSIS — J189 Pneumonia, unspecified organism: Secondary | ICD-10-CM | POA: Diagnosis not present

## 2014-12-02 DIAGNOSIS — D649 Anemia, unspecified: Secondary | ICD-10-CM | POA: Diagnosis not present

## 2014-12-02 DIAGNOSIS — J9622 Acute and chronic respiratory failure with hypercapnia: Secondary | ICD-10-CM | POA: Diagnosis not present

## 2014-12-02 DIAGNOSIS — Z9981 Dependence on supplemental oxygen: Secondary | ICD-10-CM | POA: Diagnosis not present

## 2014-12-05 DIAGNOSIS — D649 Anemia, unspecified: Secondary | ICD-10-CM | POA: Diagnosis not present

## 2014-12-05 DIAGNOSIS — Z87891 Personal history of nicotine dependence: Secondary | ICD-10-CM | POA: Diagnosis not present

## 2014-12-05 DIAGNOSIS — J9622 Acute and chronic respiratory failure with hypercapnia: Secondary | ICD-10-CM | POA: Diagnosis not present

## 2014-12-05 DIAGNOSIS — J441 Chronic obstructive pulmonary disease with (acute) exacerbation: Secondary | ICD-10-CM | POA: Diagnosis not present

## 2014-12-05 DIAGNOSIS — J189 Pneumonia, unspecified organism: Secondary | ICD-10-CM | POA: Diagnosis not present

## 2014-12-05 DIAGNOSIS — Z9981 Dependence on supplemental oxygen: Secondary | ICD-10-CM | POA: Diagnosis not present

## 2014-12-06 DIAGNOSIS — J189 Pneumonia, unspecified organism: Secondary | ICD-10-CM | POA: Diagnosis not present

## 2014-12-06 DIAGNOSIS — C44519 Basal cell carcinoma of skin of other part of trunk: Secondary | ICD-10-CM | POA: Diagnosis not present

## 2014-12-06 DIAGNOSIS — D649 Anemia, unspecified: Secondary | ICD-10-CM | POA: Diagnosis not present

## 2014-12-06 DIAGNOSIS — Z85828 Personal history of other malignant neoplasm of skin: Secondary | ICD-10-CM | POA: Diagnosis not present

## 2014-12-06 DIAGNOSIS — D485 Neoplasm of uncertain behavior of skin: Secondary | ICD-10-CM | POA: Diagnosis not present

## 2014-12-06 DIAGNOSIS — B009 Herpesviral infection, unspecified: Secondary | ICD-10-CM | POA: Diagnosis not present

## 2014-12-06 DIAGNOSIS — J441 Chronic obstructive pulmonary disease with (acute) exacerbation: Secondary | ICD-10-CM | POA: Diagnosis not present

## 2014-12-06 DIAGNOSIS — Z87891 Personal history of nicotine dependence: Secondary | ICD-10-CM | POA: Diagnosis not present

## 2014-12-06 DIAGNOSIS — J9622 Acute and chronic respiratory failure with hypercapnia: Secondary | ICD-10-CM | POA: Diagnosis not present

## 2014-12-06 DIAGNOSIS — Z9981 Dependence on supplemental oxygen: Secondary | ICD-10-CM | POA: Diagnosis not present

## 2014-12-06 DIAGNOSIS — Z08 Encounter for follow-up examination after completed treatment for malignant neoplasm: Secondary | ICD-10-CM | POA: Diagnosis not present

## 2014-12-07 DIAGNOSIS — Z87891 Personal history of nicotine dependence: Secondary | ICD-10-CM | POA: Diagnosis not present

## 2014-12-07 DIAGNOSIS — J189 Pneumonia, unspecified organism: Secondary | ICD-10-CM | POA: Diagnosis not present

## 2014-12-07 DIAGNOSIS — D649 Anemia, unspecified: Secondary | ICD-10-CM | POA: Diagnosis not present

## 2014-12-07 DIAGNOSIS — J441 Chronic obstructive pulmonary disease with (acute) exacerbation: Secondary | ICD-10-CM | POA: Diagnosis not present

## 2014-12-07 DIAGNOSIS — Z9981 Dependence on supplemental oxygen: Secondary | ICD-10-CM | POA: Diagnosis not present

## 2014-12-07 DIAGNOSIS — J9622 Acute and chronic respiratory failure with hypercapnia: Secondary | ICD-10-CM | POA: Diagnosis not present

## 2014-12-08 DIAGNOSIS — J441 Chronic obstructive pulmonary disease with (acute) exacerbation: Secondary | ICD-10-CM | POA: Diagnosis not present

## 2014-12-08 DIAGNOSIS — Z87891 Personal history of nicotine dependence: Secondary | ICD-10-CM | POA: Diagnosis not present

## 2014-12-08 DIAGNOSIS — Z9981 Dependence on supplemental oxygen: Secondary | ICD-10-CM | POA: Diagnosis not present

## 2014-12-08 DIAGNOSIS — J189 Pneumonia, unspecified organism: Secondary | ICD-10-CM | POA: Diagnosis not present

## 2014-12-08 DIAGNOSIS — D649 Anemia, unspecified: Secondary | ICD-10-CM | POA: Diagnosis not present

## 2014-12-08 DIAGNOSIS — J9622 Acute and chronic respiratory failure with hypercapnia: Secondary | ICD-10-CM | POA: Diagnosis not present

## 2014-12-09 DIAGNOSIS — Z87891 Personal history of nicotine dependence: Secondary | ICD-10-CM | POA: Diagnosis not present

## 2014-12-09 DIAGNOSIS — J9622 Acute and chronic respiratory failure with hypercapnia: Secondary | ICD-10-CM | POA: Diagnosis not present

## 2014-12-09 DIAGNOSIS — Z9981 Dependence on supplemental oxygen: Secondary | ICD-10-CM | POA: Diagnosis not present

## 2014-12-09 DIAGNOSIS — J189 Pneumonia, unspecified organism: Secondary | ICD-10-CM | POA: Diagnosis not present

## 2014-12-09 DIAGNOSIS — D649 Anemia, unspecified: Secondary | ICD-10-CM | POA: Diagnosis not present

## 2014-12-09 DIAGNOSIS — J441 Chronic obstructive pulmonary disease with (acute) exacerbation: Secondary | ICD-10-CM | POA: Diagnosis not present

## 2014-12-13 DIAGNOSIS — Z9981 Dependence on supplemental oxygen: Secondary | ICD-10-CM | POA: Diagnosis not present

## 2014-12-13 DIAGNOSIS — J9622 Acute and chronic respiratory failure with hypercapnia: Secondary | ICD-10-CM | POA: Diagnosis not present

## 2014-12-13 DIAGNOSIS — Z87891 Personal history of nicotine dependence: Secondary | ICD-10-CM | POA: Diagnosis not present

## 2014-12-13 DIAGNOSIS — D649 Anemia, unspecified: Secondary | ICD-10-CM | POA: Diagnosis not present

## 2014-12-13 DIAGNOSIS — J441 Chronic obstructive pulmonary disease with (acute) exacerbation: Secondary | ICD-10-CM | POA: Diagnosis not present

## 2014-12-13 DIAGNOSIS — J189 Pneumonia, unspecified organism: Secondary | ICD-10-CM | POA: Diagnosis not present

## 2014-12-14 DIAGNOSIS — Z87891 Personal history of nicotine dependence: Secondary | ICD-10-CM | POA: Diagnosis not present

## 2014-12-14 DIAGNOSIS — D649 Anemia, unspecified: Secondary | ICD-10-CM | POA: Diagnosis not present

## 2014-12-14 DIAGNOSIS — J189 Pneumonia, unspecified organism: Secondary | ICD-10-CM | POA: Diagnosis not present

## 2014-12-14 DIAGNOSIS — Z9981 Dependence on supplemental oxygen: Secondary | ICD-10-CM | POA: Diagnosis not present

## 2014-12-14 DIAGNOSIS — J441 Chronic obstructive pulmonary disease with (acute) exacerbation: Secondary | ICD-10-CM | POA: Diagnosis not present

## 2014-12-14 DIAGNOSIS — J9622 Acute and chronic respiratory failure with hypercapnia: Secondary | ICD-10-CM | POA: Diagnosis not present

## 2014-12-15 DIAGNOSIS — J441 Chronic obstructive pulmonary disease with (acute) exacerbation: Secondary | ICD-10-CM | POA: Diagnosis not present

## 2014-12-15 DIAGNOSIS — J9622 Acute and chronic respiratory failure with hypercapnia: Secondary | ICD-10-CM | POA: Diagnosis not present

## 2014-12-15 DIAGNOSIS — Z9981 Dependence on supplemental oxygen: Secondary | ICD-10-CM | POA: Diagnosis not present

## 2014-12-15 DIAGNOSIS — D649 Anemia, unspecified: Secondary | ICD-10-CM | POA: Diagnosis not present

## 2014-12-15 DIAGNOSIS — Z87891 Personal history of nicotine dependence: Secondary | ICD-10-CM | POA: Diagnosis not present

## 2014-12-15 DIAGNOSIS — J189 Pneumonia, unspecified organism: Secondary | ICD-10-CM | POA: Diagnosis not present

## 2014-12-16 DIAGNOSIS — J441 Chronic obstructive pulmonary disease with (acute) exacerbation: Secondary | ICD-10-CM | POA: Diagnosis not present

## 2014-12-16 DIAGNOSIS — Z9981 Dependence on supplemental oxygen: Secondary | ICD-10-CM | POA: Diagnosis not present

## 2014-12-16 DIAGNOSIS — D649 Anemia, unspecified: Secondary | ICD-10-CM | POA: Diagnosis not present

## 2014-12-16 DIAGNOSIS — J9622 Acute and chronic respiratory failure with hypercapnia: Secondary | ICD-10-CM | POA: Diagnosis not present

## 2014-12-16 DIAGNOSIS — Z87891 Personal history of nicotine dependence: Secondary | ICD-10-CM | POA: Diagnosis not present

## 2014-12-16 DIAGNOSIS — J189 Pneumonia, unspecified organism: Secondary | ICD-10-CM | POA: Diagnosis not present

## 2014-12-20 DIAGNOSIS — J441 Chronic obstructive pulmonary disease with (acute) exacerbation: Secondary | ICD-10-CM | POA: Diagnosis not present

## 2014-12-20 DIAGNOSIS — Z9981 Dependence on supplemental oxygen: Secondary | ICD-10-CM | POA: Diagnosis not present

## 2014-12-20 DIAGNOSIS — J9622 Acute and chronic respiratory failure with hypercapnia: Secondary | ICD-10-CM | POA: Diagnosis not present

## 2014-12-20 DIAGNOSIS — Z87891 Personal history of nicotine dependence: Secondary | ICD-10-CM | POA: Diagnosis not present

## 2014-12-20 DIAGNOSIS — J189 Pneumonia, unspecified organism: Secondary | ICD-10-CM | POA: Diagnosis not present

## 2014-12-20 DIAGNOSIS — D649 Anemia, unspecified: Secondary | ICD-10-CM | POA: Diagnosis not present

## 2014-12-21 DIAGNOSIS — D649 Anemia, unspecified: Secondary | ICD-10-CM | POA: Diagnosis not present

## 2014-12-21 DIAGNOSIS — J189 Pneumonia, unspecified organism: Secondary | ICD-10-CM | POA: Diagnosis not present

## 2014-12-21 DIAGNOSIS — Z87891 Personal history of nicotine dependence: Secondary | ICD-10-CM | POA: Diagnosis not present

## 2014-12-21 DIAGNOSIS — J9622 Acute and chronic respiratory failure with hypercapnia: Secondary | ICD-10-CM | POA: Diagnosis not present

## 2014-12-21 DIAGNOSIS — Z9981 Dependence on supplemental oxygen: Secondary | ICD-10-CM | POA: Diagnosis not present

## 2014-12-21 DIAGNOSIS — J441 Chronic obstructive pulmonary disease with (acute) exacerbation: Secondary | ICD-10-CM | POA: Diagnosis not present

## 2014-12-23 DIAGNOSIS — Z9981 Dependence on supplemental oxygen: Secondary | ICD-10-CM | POA: Diagnosis not present

## 2014-12-23 DIAGNOSIS — J9622 Acute and chronic respiratory failure with hypercapnia: Secondary | ICD-10-CM | POA: Diagnosis not present

## 2014-12-23 DIAGNOSIS — J441 Chronic obstructive pulmonary disease with (acute) exacerbation: Secondary | ICD-10-CM | POA: Diagnosis not present

## 2014-12-23 DIAGNOSIS — Z87891 Personal history of nicotine dependence: Secondary | ICD-10-CM | POA: Diagnosis not present

## 2014-12-23 DIAGNOSIS — J189 Pneumonia, unspecified organism: Secondary | ICD-10-CM | POA: Diagnosis not present

## 2014-12-23 DIAGNOSIS — D649 Anemia, unspecified: Secondary | ICD-10-CM | POA: Diagnosis not present

## 2014-12-24 DIAGNOSIS — J189 Pneumonia, unspecified organism: Secondary | ICD-10-CM | POA: Diagnosis not present

## 2014-12-24 DIAGNOSIS — J441 Chronic obstructive pulmonary disease with (acute) exacerbation: Secondary | ICD-10-CM | POA: Diagnosis not present

## 2014-12-24 DIAGNOSIS — D649 Anemia, unspecified: Secondary | ICD-10-CM | POA: Diagnosis not present

## 2014-12-24 DIAGNOSIS — Z9981 Dependence on supplemental oxygen: Secondary | ICD-10-CM | POA: Diagnosis not present

## 2014-12-24 DIAGNOSIS — J9622 Acute and chronic respiratory failure with hypercapnia: Secondary | ICD-10-CM | POA: Diagnosis not present

## 2014-12-24 DIAGNOSIS — Z87891 Personal history of nicotine dependence: Secondary | ICD-10-CM | POA: Diagnosis not present

## 2014-12-27 DIAGNOSIS — D649 Anemia, unspecified: Secondary | ICD-10-CM | POA: Diagnosis not present

## 2014-12-27 DIAGNOSIS — J189 Pneumonia, unspecified organism: Secondary | ICD-10-CM | POA: Diagnosis not present

## 2014-12-27 DIAGNOSIS — J441 Chronic obstructive pulmonary disease with (acute) exacerbation: Secondary | ICD-10-CM | POA: Diagnosis not present

## 2014-12-27 DIAGNOSIS — J9622 Acute and chronic respiratory failure with hypercapnia: Secondary | ICD-10-CM | POA: Diagnosis not present

## 2014-12-27 DIAGNOSIS — Z87891 Personal history of nicotine dependence: Secondary | ICD-10-CM | POA: Diagnosis not present

## 2014-12-27 DIAGNOSIS — Z9981 Dependence on supplemental oxygen: Secondary | ICD-10-CM | POA: Diagnosis not present

## 2015-01-04 ENCOUNTER — Other Ambulatory Visit (HOSPITAL_COMMUNITY)
Admission: AD | Admit: 2015-01-04 | Discharge: 2015-01-04 | Disposition: A | Discharge status: Home / Self Care | Payer: Medicare Other | Source: Other Acute Inpatient Hospital | Attending: Internal Medicine | Admitting: Internal Medicine

## 2015-01-04 DIAGNOSIS — J441 Chronic obstructive pulmonary disease with (acute) exacerbation: Secondary | ICD-10-CM | POA: Diagnosis not present

## 2015-01-04 DIAGNOSIS — N390 Urinary tract infection, site not specified: Secondary | ICD-10-CM | POA: Insufficient documentation

## 2015-01-04 DIAGNOSIS — Z87891 Personal history of nicotine dependence: Secondary | ICD-10-CM | POA: Diagnosis not present

## 2015-01-04 DIAGNOSIS — D649 Anemia, unspecified: Secondary | ICD-10-CM | POA: Diagnosis not present

## 2015-01-04 DIAGNOSIS — J189 Pneumonia, unspecified organism: Secondary | ICD-10-CM | POA: Diagnosis not present

## 2015-01-04 DIAGNOSIS — J9622 Acute and chronic respiratory failure with hypercapnia: Secondary | ICD-10-CM | POA: Diagnosis not present

## 2015-01-04 DIAGNOSIS — Z9981 Dependence on supplemental oxygen: Secondary | ICD-10-CM | POA: Diagnosis not present

## 2015-01-04 LAB — URINALYSIS, ROUTINE W REFLEX MICROSCOPIC
Bilirubin Urine: NEGATIVE
Glucose, UA: NEGATIVE mg/dL
KETONES UR: NEGATIVE mg/dL
LEUKOCYTES UA: NEGATIVE
NITRITE: NEGATIVE
PROTEIN: NEGATIVE mg/dL
Specific Gravity, Urine: 1.01 (ref 1.005–1.030)
UROBILINOGEN UA: 0.2 mg/dL (ref 0.0–1.0)
pH: 5.5 (ref 5.0–8.0)

## 2015-01-04 LAB — URINE MICROSCOPIC-ADD ON

## 2015-01-05 LAB — URINE CULTURE: Culture: 1000

## 2015-01-12 DIAGNOSIS — J9611 Chronic respiratory failure with hypoxia: Secondary | ICD-10-CM | POA: Diagnosis not present

## 2015-01-12 DIAGNOSIS — G4733 Obstructive sleep apnea (adult) (pediatric): Secondary | ICD-10-CM | POA: Diagnosis not present

## 2015-01-12 DIAGNOSIS — B370 Candidal stomatitis: Secondary | ICD-10-CM | POA: Diagnosis not present

## 2015-01-12 DIAGNOSIS — Z23 Encounter for immunization: Secondary | ICD-10-CM | POA: Diagnosis not present

## 2015-01-12 DIAGNOSIS — G4752 REM sleep behavior disorder: Secondary | ICD-10-CM | POA: Diagnosis not present

## 2015-01-12 DIAGNOSIS — J449 Chronic obstructive pulmonary disease, unspecified: Secondary | ICD-10-CM | POA: Diagnosis not present

## 2015-01-13 DIAGNOSIS — J441 Chronic obstructive pulmonary disease with (acute) exacerbation: Secondary | ICD-10-CM | POA: Diagnosis not present

## 2015-01-13 DIAGNOSIS — Z9981 Dependence on supplemental oxygen: Secondary | ICD-10-CM | POA: Diagnosis not present

## 2015-01-13 DIAGNOSIS — Z87891 Personal history of nicotine dependence: Secondary | ICD-10-CM | POA: Diagnosis not present

## 2015-01-13 DIAGNOSIS — D649 Anemia, unspecified: Secondary | ICD-10-CM | POA: Diagnosis not present

## 2015-01-13 DIAGNOSIS — J9622 Acute and chronic respiratory failure with hypercapnia: Secondary | ICD-10-CM | POA: Diagnosis not present

## 2015-01-13 DIAGNOSIS — J189 Pneumonia, unspecified organism: Secondary | ICD-10-CM | POA: Diagnosis not present

## 2015-01-18 DIAGNOSIS — J9622 Acute and chronic respiratory failure with hypercapnia: Secondary | ICD-10-CM | POA: Diagnosis not present

## 2015-01-18 DIAGNOSIS — Z9981 Dependence on supplemental oxygen: Secondary | ICD-10-CM | POA: Diagnosis not present

## 2015-01-18 DIAGNOSIS — J441 Chronic obstructive pulmonary disease with (acute) exacerbation: Secondary | ICD-10-CM | POA: Diagnosis not present

## 2015-01-18 DIAGNOSIS — Z87891 Personal history of nicotine dependence: Secondary | ICD-10-CM | POA: Diagnosis not present

## 2015-01-18 DIAGNOSIS — D649 Anemia, unspecified: Secondary | ICD-10-CM | POA: Diagnosis not present

## 2015-01-18 DIAGNOSIS — J189 Pneumonia, unspecified organism: Secondary | ICD-10-CM | POA: Diagnosis not present

## 2015-01-19 DIAGNOSIS — D649 Anemia, unspecified: Secondary | ICD-10-CM | POA: Diagnosis not present

## 2015-01-19 DIAGNOSIS — J441 Chronic obstructive pulmonary disease with (acute) exacerbation: Secondary | ICD-10-CM | POA: Diagnosis not present

## 2015-01-19 DIAGNOSIS — J9622 Acute and chronic respiratory failure with hypercapnia: Secondary | ICD-10-CM | POA: Diagnosis not present

## 2015-01-19 DIAGNOSIS — Z87891 Personal history of nicotine dependence: Secondary | ICD-10-CM | POA: Diagnosis not present

## 2015-01-19 DIAGNOSIS — Z8701 Personal history of pneumonia (recurrent): Secondary | ICD-10-CM | POA: Diagnosis not present

## 2015-01-19 DIAGNOSIS — Z9981 Dependence on supplemental oxygen: Secondary | ICD-10-CM | POA: Diagnosis not present

## 2015-01-20 ENCOUNTER — Inpatient Hospital Stay (HOSPITAL_COMMUNITY)
Admission: EM | Admit: 2015-01-20 | Discharge: 2015-01-26 | DRG: 191 | Disposition: A | Discharge status: Home Health | Payer: Medicare Other | Attending: Internal Medicine | Admitting: Internal Medicine

## 2015-01-20 ENCOUNTER — Emergency Department (HOSPITAL_COMMUNITY): Payer: Medicare Other

## 2015-01-20 ENCOUNTER — Encounter (HOSPITAL_COMMUNITY): Payer: Self-pay | Admitting: Emergency Medicine

## 2015-01-20 DIAGNOSIS — Z8249 Family history of ischemic heart disease and other diseases of the circulatory system: Secondary | ICD-10-CM

## 2015-01-20 DIAGNOSIS — Z8701 Personal history of pneumonia (recurrent): Secondary | ICD-10-CM

## 2015-01-20 DIAGNOSIS — Z981 Arthrodesis status: Secondary | ICD-10-CM | POA: Diagnosis not present

## 2015-01-20 DIAGNOSIS — R0602 Shortness of breath: Secondary | ICD-10-CM | POA: Diagnosis not present

## 2015-01-20 DIAGNOSIS — J9611 Chronic respiratory failure with hypoxia: Secondary | ICD-10-CM

## 2015-01-20 DIAGNOSIS — B370 Candidal stomatitis: Secondary | ICD-10-CM | POA: Diagnosis not present

## 2015-01-20 DIAGNOSIS — E860 Dehydration: Secondary | ICD-10-CM | POA: Diagnosis present

## 2015-01-20 DIAGNOSIS — Z7952 Long term (current) use of systemic steroids: Secondary | ICD-10-CM | POA: Diagnosis not present

## 2015-01-20 DIAGNOSIS — Z825 Family history of asthma and other chronic lower respiratory diseases: Secondary | ICD-10-CM | POA: Diagnosis not present

## 2015-01-20 DIAGNOSIS — Z87891 Personal history of nicotine dependence: Secondary | ICD-10-CM

## 2015-01-20 DIAGNOSIS — E785 Hyperlipidemia, unspecified: Secondary | ICD-10-CM | POA: Diagnosis not present

## 2015-01-20 DIAGNOSIS — D72829 Elevated white blood cell count, unspecified: Secondary | ICD-10-CM

## 2015-01-20 DIAGNOSIS — J9620 Acute and chronic respiratory failure, unspecified whether with hypoxia or hypercapnia: Secondary | ICD-10-CM | POA: Diagnosis present

## 2015-01-20 DIAGNOSIS — J441 Chronic obstructive pulmonary disease with (acute) exacerbation: Secondary | ICD-10-CM | POA: Diagnosis not present

## 2015-01-20 DIAGNOSIS — R05 Cough: Secondary | ICD-10-CM

## 2015-01-20 DIAGNOSIS — Z9981 Dependence on supplemental oxygen: Secondary | ICD-10-CM

## 2015-01-20 DIAGNOSIS — J9621 Acute and chronic respiratory failure with hypoxia: Secondary | ICD-10-CM | POA: Diagnosis not present

## 2015-01-20 DIAGNOSIS — D539 Nutritional anemia, unspecified: Secondary | ICD-10-CM | POA: Diagnosis not present

## 2015-01-20 DIAGNOSIS — R509 Fever, unspecified: Secondary | ICD-10-CM | POA: Diagnosis not present

## 2015-01-20 DIAGNOSIS — J9612 Chronic respiratory failure with hypercapnia: Secondary | ICD-10-CM

## 2015-01-20 DIAGNOSIS — Z833 Family history of diabetes mellitus: Secondary | ICD-10-CM

## 2015-01-20 DIAGNOSIS — R059 Cough, unspecified: Secondary | ICD-10-CM

## 2015-01-20 LAB — CBC WITH DIFFERENTIAL/PLATELET
BASOS PCT: 0 %
Basophils Absolute: 0 10*3/uL (ref 0.0–0.1)
Eosinophils Absolute: 0.1 10*3/uL (ref 0.0–0.7)
Eosinophils Relative: 0 %
HEMATOCRIT: 37.9 % (ref 36.0–46.0)
HEMOGLOBIN: 12.3 g/dL (ref 12.0–15.0)
LYMPHS ABS: 0.7 10*3/uL (ref 0.7–4.0)
Lymphocytes Relative: 2 %
MCH: 32.6 pg (ref 26.0–34.0)
MCHC: 32.5 g/dL (ref 30.0–36.0)
MCV: 100.5 fL — ABNORMAL HIGH (ref 78.0–100.0)
MONO ABS: 1.2 10*3/uL — AB (ref 0.1–1.0)
MONOS PCT: 4 %
NEUTROS ABS: 27.1 10*3/uL — AB (ref 1.7–7.7)
NEUTROS PCT: 93 %
Platelets: 253 10*3/uL (ref 150–400)
RBC: 3.77 MIL/uL — ABNORMAL LOW (ref 3.87–5.11)
RDW: 13.7 % (ref 11.5–15.5)
WBC: 29.1 10*3/uL — ABNORMAL HIGH (ref 4.0–10.5)

## 2015-01-20 LAB — URINE MICROSCOPIC-ADD ON

## 2015-01-20 LAB — BLOOD GAS, VENOUS
ACID-BASE EXCESS: 9.8 mmol/L — AB (ref 0.0–2.0)
Bicarbonate: 31.7 mEq/L — ABNORMAL HIGH (ref 20.0–24.0)
O2 Content: 4 L/min
O2 Saturation: 90.4 %
PCO2 VEN: 67.4 mmHg — AB (ref 45.0–50.0)
PH VEN: 7.34 — AB (ref 7.250–7.300)
PO2 VEN: 64.4 mmHg — AB (ref 30.0–45.0)
TCO2: 15.3 mmol/L (ref 0–100)

## 2015-01-20 LAB — PROTIME-INR
INR: 1.01 (ref 0.00–1.49)
PROTHROMBIN TIME: 13.5 s (ref 11.6–15.2)

## 2015-01-20 LAB — URINALYSIS, ROUTINE W REFLEX MICROSCOPIC
Bilirubin Urine: NEGATIVE
GLUCOSE, UA: NEGATIVE mg/dL
KETONES UR: NEGATIVE mg/dL
LEUKOCYTES UA: NEGATIVE
NITRITE: NEGATIVE
PROTEIN: NEGATIVE mg/dL
Specific Gravity, Urine: 1.01 (ref 1.005–1.030)
UROBILINOGEN UA: 0.2 mg/dL (ref 0.0–1.0)
pH: 6.5 (ref 5.0–8.0)

## 2015-01-20 LAB — I-STAT TROPONIN, ED: Troponin i, poc: 0.01 ng/mL (ref 0.00–0.08)

## 2015-01-20 LAB — BRAIN NATRIURETIC PEPTIDE: B Natriuretic Peptide: 86 pg/mL (ref 0.0–100.0)

## 2015-01-20 LAB — RAPID STREP SCREEN (MED CTR MEBANE ONLY): STREPTOCOCCUS, GROUP A SCREEN (DIRECT): NEGATIVE

## 2015-01-20 LAB — I-STAT CG4 LACTIC ACID, ED: LACTIC ACID, VENOUS: 0.75 mmol/L (ref 0.5–2.0)

## 2015-01-20 MED ORDER — HYDROMORPHONE HCL 1 MG/ML IJ SOLN
0.5000 mg | Freq: Once | INTRAMUSCULAR | Status: AC
  Administered 2015-01-20: 0.5 mg via INTRAVENOUS
  Filled 2015-01-20: qty 1

## 2015-01-20 MED ORDER — KETOROLAC TROMETHAMINE 30 MG/ML IJ SOLN
INTRAMUSCULAR | Status: AC
  Filled 2015-01-20: qty 1

## 2015-01-20 MED ORDER — KETOROLAC TROMETHAMINE 30 MG/ML IJ SOLN
30.0000 mg | Freq: Once | INTRAMUSCULAR | Status: AC
  Administered 2015-01-20: 30 mg via INTRAVENOUS
  Filled 2015-01-20: qty 1

## 2015-01-20 MED ORDER — SODIUM CHLORIDE 0.9 % IV BOLUS (SEPSIS)
1000.0000 mL | Freq: Once | INTRAVENOUS | Status: AC
  Administered 2015-01-20: 1000 mL via INTRAVENOUS

## 2015-01-20 MED ORDER — FLUCONAZOLE 100 MG PO TABS
100.0000 mg | ORAL_TABLET | Freq: Once | ORAL | Status: AC
  Administered 2015-01-20: 100 mg via ORAL
  Filled 2015-01-20: qty 1

## 2015-01-20 MED ORDER — SODIUM CHLORIDE 0.9 % IV BOLUS (SEPSIS)
500.0000 mL | Freq: Once | INTRAVENOUS | Status: AC
  Administered 2015-01-20: 500 mL via INTRAVENOUS

## 2015-01-20 NOTE — ED Notes (Signed)
Pt with SOB since Tuesday.

## 2015-01-20 NOTE — H&P (Signed)
[facility] History and Physical  [name]:[id number] DOB: July 01, 1955    PCP:   Cassell Smiles., MD   Chief Complaint: SOB.  HPI:   [name] is an 59 y.o. female with hx of severe COPD, prior smoker, on 4L  home oxygen, hx of CAD, hx of SVT, HLD, presented to the ER with increase SOB, feeling feverish, and general fatigue, requiring increase oxygen use.  She was found to have a marked leukocytosis with WBC of 30K, and MVC of 100.  She has had chronic leukocytosis on steroids, but not this high.  Evaluation included a CXR showing no infiltrate, and her bicarb was elevated to 30's.  Hospitalist was asked to admit her for further evaluation and tx.  I remember her as I had taken care of her before.  She sees Dr Egbert Garibaldi of pulmonary outpatient, and she had seen Dr Blase Mess shortly during her last bout.  EDP spoke with both of her children tonight, one is a hospitalist in [employer], the other the a radiologist in [facility].  It was decided that we will bring her in for COPD exacerbation, though she was not in significant respiratory distress.  She does use her CPAP/Bipap every night at home.   Rewiew of Systems:  Constitutional: Negative for malaise, fever and chills. No significant weight loss or weight gain Eyes: Negative for eye pain, redness and discharge, diplopia, visual changes, or flashes of light. ENMT: Negative for ear pain, hoarseness, nasal congestion, sinus pressure and sore throat. No headaches; tinnitus, drooling, or problem swallowing. Cardiovascular: Negative for chest pain, palpitations, diaphoresis, and peripheral edema. ; No orthopnea, PND Respiratory: Negative for cough, hemoptysis, wheezing and stridor. No pleuritic chestpain. Gastrointestinal: Negative for nausea, vomiting, diarrhea, constipation, abdominal pain, melena, blood in stool, hematemesis, jaundice and rectal bleeding.    Genitourinary: Negative for frequency, dysuria, incontinence,flank pain and  hematuria; Musculoskeletal: Negative for back pain and neck pain. Negative for swelling and trauma.;  Skin: . Negative for pruritus, rash, abrasions, bruising and skin lesion.; ulcerations Neuro: Negative for headache, lightheadedness and neck stiffness. Negative for weakness, altered level of consciousness , altered mental status, extremity weakness, burning feet, involuntary movement, seizure and syncope.  Psych: negative for anxiety, depression, insomnia, tearfulness, panic attacks, hallucinations, paranoia, suicidal or homicidal ideation    Past Medical History  Diagnosis Date  . COPD (chronic obstructive pulmonary disease)     on home o2 4LNC  . Hypoglycemia   . SVT (supraventricular tachycardia)   . Tachycardia   . Complication of anesthesia     o2 sats dropped after back surgery  . Shortness of breath   . IBS (irritable bowel syndrome)   . Diverticulosis   . Hyperlipidemia     Past Surgical History  Procedure Laterality Date  . Spinal fusion    . Back surgery    . Colonoscopy  03/22/2011    Procedure: COLONOSCOPY;  Surgeon: Malissa Hippo, MD;  [facility];  Service: Endoscopy;  Laterality: N/A;  . Cardiac catheterization  07/05/2003    no significant CAD  . Nm myocar perf wall motion  08/19/2012    normal  . US echocardiography  09/14/2010    mild pulmonary artery hypertension    Medications:  HOME MEDS: Prior to Admission medications   Medication Sig Start Date End Date Taking? Authorizing Provider  albuterol (PROAIR HFA) 108 (90 BASE) MCG/ACT inhaler Inhale 2 puffs into the lungs every 6 (six) hours as needed. For shortness  of breath   Yes Historical Provider, MD  ALPRAZolam Prudy Feeler) 0.5 MG tablet Take 0.5 mg by mouth daily as needed. anxiety   Yes Historical Provider, MD  aspirin 81 MG tablet Take 81 mg by mouth daily.   Yes Historical Provider, MD  atorvastatin (LIPITOR) 40 MG tablet Take 1 tablet (40 mg total) by mouth daily. 10/18/14  Yes Runell Gess, MD  cetirizine (ZYRTEC) 10 MG tablet Take 10 mg by mouth daily.     Yes Historical Provider, MD  clonazePAM (KLONOPIN) 0.5 MG tablet Take 0.5 mg by mouth at bedtime. 11/01/14  Yes Historical Provider, MD  diltiazem (CARDIZEM CD) 180 MG 24 hr capsule TAKE 1 CAPSULE BY MOUTH DAILY 10/12/14  Yes Runell Gess, MD  escitalopram (LEXAPRO) 20 MG tablet Take 20 mg by mouth daily.     Yes Historical Provider, MD  fluconazole (DIFLUCAN) 100 MG tablet Take 1 tablet (100 mg total) by mouth daily. Patient taking differently: Take 100 mg by mouth every Monday at 6 PM.  11/19/14  Yes Costin Otelia Sergeant, MD  Fluticasone-Salmeterol (ADVAIR) 250-50 MCG/DOSE AEPB Inhale 1 puff into the lungs 2 (two) times daily.    Yes Historical Provider, MD  ibuprofen (ADVIL,MOTRIN) 200 MG tablet Take 400 mg by mouth 2 (two) times daily as needed for mild pain or moderate pain.   Yes Historical Provider, MD  Multiple Vitamins-Minerals (MULTIVITAMINS THER. W/MINERALS) TABS Take 1 tablet by mouth daily.     Yes Historical Provider, MD  oxybutynin (DITROPAN) 5 MG tablet Take 5 mg by mouth daily.  08/28/13  Yes Historical Provider, MD  predniSONE (DELTASONE) 10 MG tablet Take 1 tablet (10 mg total) by mouth daily. Take 40 mg x 3 days then 30 mg x 3 days then 20 mg x 3 days then resume maintenance dose 10 mg daily Patient taking differently: Take 10 mg by mouth daily.  11/19/14  Yes Costin Otelia Sergeant, MD  roflumilast (DALIRESP) 500 MCG TABS tablet Take 500 mcg by mouth daily.     Yes Historical Provider, MD  SPIRIVA RESPIMAT 2.5 MCG/ACT AERS Inhale 1 puff into the lungs daily.  10/17/14  Yes Historical Provider, MD  triamcinolone (NASACORT ALLERGY 24HR) 55 MCG/ACT AERO nasal inhaler Place 2 sprays into the nose daily as needed (FOR ALLERGIES).   Yes Historical Provider, MD  fluconazole (DIFLUCAN) 150 MG tablet Take 150 mg by mouth once a week.  08/21/12   Historical Provider, MD  levofloxacin (LEVAQUIN) 750 MG tablet Take 1 tablet (750  mg total) by mouth daily. Patient not taking: Reported on 01/20/2015 11/19/14   Leatha Gilding, MD     Allergies:  Allergies  Allergen Reactions  . Iohexol Anaphylaxis     Desc: IV DYE  ANAPHYLATIC SHOCK X2 ONCE AFTER PREMEDS   . Chantix [Varenicline] Other (See Comments)    PALPITATIONS  . Ketek [Telithromycin]   . Penicillins Other (See Comments)    unknown    Social History:   reports that she quit smoking about 3 years ago. Her smoking use included Cigarettes. She has a 40 pack-year smoking history. She has never used smokeless tobacco. She reports that she drinks alcohol. She reports that she does not use illicit drugs.  Family History: Family History  Problem Relation Age of Onset  . Asthma Mother   . Heart attack Mother   . Diabetes Mother   . Heart failure Mother      Physical Exam: Filed Vitals:  01/20/15 2028 01/20/15 2030 01/20/15 2130 01/20/15 2200  BP: 128/78 126/70 116/58 124/68  Pulse: 96  93 91  Temp:      TempSrc:      Resp: 20     Height:      Weight:      SpO2: 95%  98% 98%   Blood pressure 124/68, pulse 91, temperature 100 F (37.8 C), temperature source Oral, resp. rate 20, height 5\' 3"  (1.6 m), weight 48.535 kg (107 lb), SpO2 98 %.  GEN:  Pleasant patient lying in the stretcher in no acute distress; cooperative with exam. PSYCH:  alert and oriented x4; does not appear anxious or depressed; affect is appropriate. HEENT: Mucous membranes pink and anicteric; PERRLA; EOM intact; no cervical lymphadenopathy nor thyromegaly or carotid bruit; no JVD; There were no stridor. Neck is very supple. Breasts:: Not examined CHEST WALL: No tenderness CHEST: Normal respiration, clear to auscultation bilaterally.  HEART: Regular rate and rhythm.  There are no murmur, rub, or gallops.   BACK: No kyphosis or scoliosis; no CVA tenderness ABDOMEN: soft and non-tender; no masses, no organomegaly, normal abdominal bowel sounds; no pannus; no intertriginous  candida. There is no rebound and no distention. Rectal Exam: Not done EXTREMITIES: No bone or joint deformity; age-appropriate arthropathy of the hands and knees; no edema; no ulcerations.  There is no calf tenderness. Genitalia: not examined PULSES: 2+ and symmetric SKIN: Normal hydration no rash or ulceration CNS: Cranial nerves 2-12 grossly intact no focal lateralizing neurologic deficit.  Speech is fluent; uvula elevated with phonation, facial symmetry and tongue midline. DTR are normal bilaterally, cerebella exam is intact, barbinski is negative and strengths are equaled bilaterally.  No sensory loss.   CBC:  Recent Labs Lab 01/20/15 1906  WBC 29.1*  NEUTROABS 27.1*  HGB 12.3  HCT 37.9  MCV 100.5*  PLT 253    Radiological Exams on Admission: Dg Chest 2 View  01/20/2015   CLINICAL DATA:  59 year old female with shortness of breath and congestion and fever.  EXAM: CHEST  2 VIEW  COMPARISON:  Radiograph dated 11/13/2013 and the dated 06/12/2006  FINDINGS: Two views of the chest demonstrate emphysematous changes of the lungs. There is mild blunting of the costophrenic angles which may represent a small pleural effusion. Scattered bilateral nodular density measuring up to 6 mm in the right mid lung field correspond to the calcified granuloma seen on the prior CT. Linear atelectasis/ scarring noted at the lower lung fields. There is no focal consolidation or pneumothorax. Stable cardiac silhouette. Degenerative changes of the spine. Cervical fixation plate and screws with no acute fracture.  IMPRESSION: Emphysema.  No focal consolidation or pneumothorax.  Slight blunting of the costophrenic angles may represent a small pleural effusion.   Electronically Signed   By: Elgie Collard M.D.   On: 01/20/2015 20:50    EKG: Independently reviewed.    Assessment/Plan Present on Admission:  . COPD exacerbation . Acute on chronic respiratory failure . Hyperlipidemia  PLAN:  Will admit her  for COPD exacerbation.  Will give Levoquin IV, and do cultures.  I will start her on IV Steroids, along with frequent nebs.  Of note, she has macrocytic indices, and I will obtain B12 and folate, though possible, I don't think she has any myeloproliferative process going on.  I suspect it is pure steroid induced.  We will need to follow this up at outpatient upon discharge.  Also consideration give for theophyllin, oral terbutaline, and Diamox discussed  with her, but not instituted.  She is stable, full code, and will be admitted to [facility] service.  Thank you and Good day.   Other plans as per orders.  Code Status: FULL Unk Lightning, MD. [facility] Pager [phone] 7pm to 7am.  01/20/2015, 11:25 PM

## 2015-01-20 NOTE — ED Provider Notes (Signed)
CSN: [id number]     Arrival date & time 01/20/15  1810 History   First MD Initiated Contact with Patient 01/20/15 1816     Chief Complaint  Patient presents with  . Shortness of Breath     (Consider location/radiation/quality/duration/timing/severity/associated sxs/prior Treatment) HPI   Patient is a pleasant 59 year old female with past mental history significant for COPD on 4 L of home O2. She has history of tachycardia recent history of pneumonia last month. She is presenting today with fevers at home and elevated heart rate at home. Patient states the last 3 days she's had occasional fevers and she's been feeling unwell, dehydrated. Patient had no increase in cough. She has been blowing her nose more than usual. She's had no increased shortness of breath and no increased oxygen use.  Denies any urinary symptoms. She had recent visit with the pulmonologist last week which went normally. Past Medical History  Diagnosis Date  . COPD (chronic obstructive pulmonary disease)     on home o2 4LNC  . Hypoglycemia   . SVT (supraventricular tachycardia)   . Tachycardia   . Complication of anesthesia     o2 sats dropped after back surgery  . Shortness of breath   . IBS (irritable bowel syndrome)   . Diverticulosis   . Hyperlipidemia    Past Surgical History  Procedure Laterality Date  . Spinal fusion    . Back surgery    . Colonoscopy  03/22/2011    Procedure: COLONOSCOPY;  Surgeon: Rogene Houston, MD;  Location: [facility];  Service: Endoscopy;  Laterality: N/A;  . Cardiac catheterization  07/05/2003    no significant CAD  . Nm myocar perf wall motion  08/19/2012    normal  . US echocardiography  09/14/2010    mild pulmonary artery hypertension   Family History  Problem Relation Age of Onset  . Asthma Mother   . Heart attack Mother   . Diabetes Mother   . Heart failure Mother    Social History  Substance Use Topics  . Smoking status: Former Smoker -- 1.00 packs/day for 40  years    Types: Cigarettes    Quit date: 05/07/2011  . Smokeless tobacco: Never Used     Comment: presently smoking about 2 a day  . Alcohol Use: 0.0 oz/week    0 Standard drinks or equivalent per week     Comment: rare   OB History    No data available     Review of Systems  Constitutional: Negative for activity change and fatigue.  HENT: Positive for rhinorrhea and sore throat. Negative for congestion and drooling.   Eyes: Negative for discharge.  Respiratory: Negative for cough, chest tightness and shortness of breath.   Cardiovascular: Negative for chest pain.  Gastrointestinal: Negative for vomiting, abdominal pain, diarrhea and abdominal distention.  Genitourinary: Negative for dysuria and difficulty urinating.  Musculoskeletal: Negative for joint swelling.  Skin: Negative for rash.  Allergic/Immunologic: Negative for immunocompromised state.  Neurological: Negative for speech difficulty.  Psychiatric/Behavioral: Negative for behavioral problems and agitation.      Allergies  Iohexol; Chantix; Ketek; and Penicillins  Home Medications   Prior to Admission medications   Medication Sig Start Date End Date Taking? Authorizing Provider  albuterol (PROAIR HFA) 108 (90 BASE) MCG/ACT inhaler Inhale 2 puffs into the lungs every 6 (six) hours as needed. For shortness of breath   Yes Historical Provider, MD  ALPRAZolam Duanne Moron) 0.5 MG tablet Take 0.5 mg by mouth  daily as needed. anxiety   Yes Historical Provider, MD  aspirin 81 MG tablet Take 81 mg by mouth daily.   Yes Historical Provider, MD  atorvastatin (LIPITOR) 40 MG tablet Take 1 tablet (40 mg total) by mouth daily. 10/18/14  Yes Lorretta Harp, MD  cetirizine (ZYRTEC) 10 MG tablet Take 10 mg by mouth daily.     Yes Historical Provider, MD  clonazePAM (KLONOPIN) 0.5 MG tablet Take 0.5 mg by mouth at bedtime. 11/01/14  Yes Historical Provider, MD  diltiazem (CARDIZEM CD) 180 MG 24 hr capsule TAKE 1 CAPSULE BY MOUTH DAILY  10/12/14  Yes Lorretta Harp, MD  escitalopram (LEXAPRO) 20 MG tablet Take 20 mg by mouth daily.     Yes Historical Provider, MD  fluconazole (DIFLUCAN) 100 MG tablet Take 1 tablet (100 mg total) by mouth daily. Patient taking differently: Take 100 mg by mouth every Monday at 6 PM.  11/19/14  Yes Costin Karlyne Greenspan, MD  Fluticasone-Salmeterol (ADVAIR) 250-50 MCG/DOSE AEPB Inhale 1 puff into the lungs 2 (two) times daily.    Yes Historical Provider, MD  ibuprofen (ADVIL,MOTRIN) 200 MG tablet Take 400 mg by mouth 2 (two) times daily as needed for mild pain or moderate pain.   Yes Historical Provider, MD  Multiple Vitamins-Minerals (MULTIVITAMINS THER. W/MINERALS) TABS Take 1 tablet by mouth daily.     Yes Historical Provider, MD  oxybutynin (DITROPAN) 5 MG tablet Take 5 mg by mouth daily.  08/28/13  Yes Historical Provider, MD  predniSONE (DELTASONE) 10 MG tablet Take 1 tablet (10 mg total) by mouth daily. Take 40 mg x 3 days then 30 mg x 3 days then 20 mg x 3 days then resume maintenance dose 10 mg daily Patient taking differently: Take 10 mg by mouth daily.  11/19/14  Yes Costin Karlyne Greenspan, MD  roflumilast (DALIRESP) 500 MCG TABS tablet Take 500 mcg by mouth daily.     Yes Historical Provider, MD  SPIRIVA RESPIMAT 2.5 MCG/ACT AERS Inhale 1 puff into the lungs daily.  10/17/14  Yes Historical Provider, MD  triamcinolone (NASACORT ALLERGY 24HR) 55 MCG/ACT AERO nasal inhaler Place 2 sprays into the nose daily as needed (FOR ALLERGIES).   Yes Historical Provider, MD  fluconazole (DIFLUCAN) 150 MG tablet Take 150 mg by mouth once a week.  08/21/12   Historical Provider, MD  levofloxacin (LEVAQUIN) 750 MG tablet Take 1 tablet (750 mg total) by mouth daily. Patient not taking: Reported on 01/20/2015 11/19/14   Costin Karlyne Greenspan, MD   BP 116/58 mmHg  Pulse 93  Temp(Src) 100 F (37.8 C) (Oral)  Resp 20  Ht 5\' 3"  (1.6 m)  Wt 107 lb (48.535 kg)  BMI 18.96 kg/m2  SpO2 98% Physical Exam  Constitutional: She is  oriented to person, place, and time. She appears well-developed and well-nourished.  HENT:  Head: Normocephalic and atraumatic.  White exudate bilateral posterior fornix pharynx and roof of the mouth.  Eyes: Conjunctivae are normal. Right eye exhibits no discharge.  Neck: Neck supple.  Cardiovascular: Normal rate, regular rhythm and normal heart sounds.   No murmur heard. Pulmonary/Chest: Effort normal and breath sounds normal. She has no wheezes. She has no rales.  On 4 L of oxygen.  Abdominal: Soft. She exhibits no distension. There is no tenderness.  Musculoskeletal: Normal range of motion. She exhibits no edema.  Neurological: She is oriented to person, place, and time. No cranial nerve deficit.  Skin: Skin is warm and dry. No  rash noted. She is not diaphoretic.  Psychiatric: She has a normal mood and affect. Her behavior is normal.  Nursing note and vitals reviewed.   ED Course  Procedures (including critical care time) Labs Review Labs Reviewed  BLOOD GAS, VENOUS - Abnormal; Notable for the following:    pH, Ven 7.34 (*)    pCO2, Ven 67.4 (*)    pO2, Ven 64.4 (*)    Bicarbonate 31.7 (*)    Acid-Base Excess 9.8 (*)    All other components within normal limits  CBC WITH DIFFERENTIAL/PLATELET - Abnormal; Notable for the following:    WBC 29.1 (*)    RBC 3.77 (*)    MCV 100.5 (*)    Neutro Abs 27.1 (*)    Monocytes Absolute 1.2 (*)    All other components within normal limits  URINALYSIS, ROUTINE W REFLEX MICROSCOPIC (NOT AT [facility]) - Abnormal; Notable for the following:    Hgb urine dipstick MODERATE (*)    All other components within normal limits  URINE MICROSCOPIC-ADD ON - Abnormal; Notable for the following:    Bacteria, UA FEW (*)    All other components within normal limits  RAPID STREP SCREEN (NOT AT [facility])  URINE CULTURE  CULTURE, GROUP A STREP  BRAIN NATRIURETIC PEPTIDE  PROTIME-INR  I-STAT TROPOININ, ED  I-STAT CG4 LACTIC ACID, ED  I-STAT CG4 LACTIC ACID, ED     Imaging Review Dg Chest 2 View  01/20/2015   CLINICAL DATA:  59 year old female with shortness of breath and congestion and fever.  EXAM: CHEST  2 VIEW  COMPARISON:  Radiograph dated 11/13/2013 and the dated 06/12/2006  FINDINGS: Two views of the chest demonstrate emphysematous changes of the lungs. There is mild blunting of the costophrenic angles which may represent a small pleural effusion. Scattered bilateral nodular density measuring up to 6 mm in the right mid lung field correspond to the calcified granuloma seen on the prior CT. Linear atelectasis/ scarring noted at the lower lung fields. There is no focal consolidation or pneumothorax. Stable cardiac silhouette. Degenerative changes of the spine. Cervical fixation plate and screws with no acute fracture.  IMPRESSION: Emphysema.  No focal consolidation or pneumothorax.  Slight blunting of the costophrenic angles may represent a small pleural effusion.   Electronically Signed   By: Anner Crete M.D.   On: 01/20/2015 20:50   I have personally reviewed and evaluated these images and lab results as part of my medical decision-making.   EKG Interpretation   Date/Time:  Friday January 20 2015 19:53:44 EDT Ventricular Rate:  105 PR Interval:  87 QRS Duration: 77 QT Interval:  394 QTC Calculation: 521 R Axis:   87 Text Interpretation:  Sinus tachycardia Abnormal R-wave progression, late  transition Nonspecific T abnormalities, diffuse leads Prolonged QT  interval no acute ischemia No significant change since last tracing  Confirmed by Gerald Leitz ([id number]) on 01/20/2015 9:57:23 PM      MDM   Final diagnoses:  None    Patient is a very pleasant 59 year old female with COPD presenting with fevers for the last couple days. Condition patient feels like her heart rate is been elevated and she is dehydrated. On exam patient has elevated heart rate, mucous hemorrhage and dry. Patient has notable thrush in her mouth. We'll test for  strep but most likely thrush given that it extends to the roof of her mouth. Patient says this is new since her last visits the doctor last week.  We will check  for chest x-ray for pneumonia, urinary tract infectino and strep. Patient is breathing normally, without any new O2 requirement so pneumonia is less likely on my differential. She had no increase in sputum so I don't think this is a COPD exacerbation. Concern of the fever either could be from thrush, or urinary tract infection, or viral syndrome. However she has very little reserve, and her heart rate continues to be elevated if she moves around the room. We will give hydration and continue evaluation.  9:57 PM Patietn has very elevated wbc. She is on 10 mg prednisone daily, but this wbc is higher than I would expect even with that. No known source. Discussed with both twin daughters (both doctors).  Will admit for observation to make sure WBC trending. Pt tachcardic after initial 1L, so will give an additional 500 cc. Treated thrush with diflucan.  Courteney Julio Alm, MD 01/20/15 2157

## 2015-01-21 DIAGNOSIS — D539 Nutritional anemia, unspecified: Secondary | ICD-10-CM

## 2015-01-21 DIAGNOSIS — D72829 Elevated white blood cell count, unspecified: Secondary | ICD-10-CM

## 2015-01-21 DIAGNOSIS — J9611 Chronic respiratory failure with hypoxia: Secondary | ICD-10-CM

## 2015-01-21 LAB — CBC
HCT: 33.7 % — ABNORMAL LOW (ref 36.0–46.0)
Hemoglobin: 10.8 g/dL — ABNORMAL LOW (ref 12.0–15.0)
MCH: 32.7 pg (ref 26.0–34.0)
MCHC: 32 g/dL (ref 30.0–36.0)
MCV: 102.1 fL — ABNORMAL HIGH (ref 78.0–100.0)
PLATELETS: 245 10*3/uL (ref 150–400)
RBC: 3.3 MIL/uL — AB (ref 3.87–5.11)
RDW: 13.6 % (ref 11.5–15.5)
WBC: 24.2 10*3/uL — AB (ref 4.0–10.5)

## 2015-01-21 LAB — BASIC METABOLIC PANEL
ANION GAP: 5 (ref 5–15)
BUN: 9 mg/dL (ref 6–20)
CALCIUM: 8.1 mg/dL — AB (ref 8.9–10.3)
CO2: 34 mmol/L — ABNORMAL HIGH (ref 22–32)
Chloride: 100 mmol/L — ABNORMAL LOW (ref 101–111)
Creatinine, Ser: 0.63 mg/dL (ref 0.44–1.00)
GLUCOSE: 134 mg/dL — AB (ref 65–99)
POTASSIUM: 4.7 mmol/L (ref 3.5–5.1)
SODIUM: 139 mmol/L (ref 135–145)

## 2015-01-21 LAB — VITAMIN B12: VITAMIN B 12: 775 pg/mL (ref 180–914)

## 2015-01-21 LAB — TSH: TSH: 0.574 u[IU]/mL (ref 0.350–4.500)

## 2015-01-21 MED ORDER — CLONAZEPAM 0.5 MG PO TABS
0.5000 mg | ORAL_TABLET | Freq: Every day | ORAL | Status: DC
  Administered 2015-01-21 – 2015-01-25 (×6): 0.5 mg via ORAL
  Filled 2015-01-21 (×6): qty 1

## 2015-01-21 MED ORDER — SODIUM CHLORIDE 0.9 % IJ SOLN
3.0000 mL | Freq: Two times a day (BID) | INTRAMUSCULAR | Status: DC
  Administered 2015-01-21 – 2015-01-26 (×11): 3 mL via INTRAVENOUS

## 2015-01-21 MED ORDER — IPRATROPIUM-ALBUTEROL 0.5-2.5 (3) MG/3ML IN SOLN
3.0000 mL | RESPIRATORY_TRACT | Status: DC
  Administered 2015-01-21 – 2015-01-22 (×10): 3 mL via RESPIRATORY_TRACT
  Filled 2015-01-21 (×10): qty 3

## 2015-01-21 MED ORDER — DILTIAZEM HCL ER COATED BEADS 180 MG PO CP24
180.0000 mg | ORAL_CAPSULE | Freq: Every day | ORAL | Status: DC
  Administered 2015-01-21 – 2015-01-26 (×6): 180 mg via ORAL
  Filled 2015-01-21 (×6): qty 1

## 2015-01-21 MED ORDER — MOMETASONE FURO-FORMOTEROL FUM 100-5 MCG/ACT IN AERO
2.0000 | INHALATION_SPRAY | Freq: Two times a day (BID) | RESPIRATORY_TRACT | Status: DC
  Administered 2015-01-21 – 2015-01-22 (×3): 2 via RESPIRATORY_TRACT
  Filled 2015-01-21: qty 8.8

## 2015-01-21 MED ORDER — HEPARIN SODIUM (PORCINE) 5000 UNIT/ML IJ SOLN
5000.0000 [IU] | Freq: Three times a day (TID) | INTRAMUSCULAR | Status: DC
  Administered 2015-01-21 – 2015-01-26 (×17): 5000 [IU] via SUBCUTANEOUS
  Filled 2015-01-21 (×17): qty 1

## 2015-01-21 MED ORDER — ALBUTEROL SULFATE (2.5 MG/3ML) 0.083% IN NEBU
2.5000 mg | INHALATION_SOLUTION | RESPIRATORY_TRACT | Status: DC | PRN
  Administered 2015-01-21 – 2015-01-26 (×2): 2.5 mg via RESPIRATORY_TRACT
  Filled 2015-01-21 (×2): qty 3

## 2015-01-21 MED ORDER — LEVOFLOXACIN IN D5W 750 MG/150ML IV SOLN
750.0000 mg | INTRAVENOUS | Status: DC
  Administered 2015-01-21 – 2015-01-23 (×3): 750 mg via INTRAVENOUS
  Filled 2015-01-21 (×3): qty 150

## 2015-01-21 MED ORDER — ADULT MULTIVITAMIN W/MINERALS CH
1.0000 | ORAL_TABLET | Freq: Every day | ORAL | Status: DC
  Administered 2015-01-21 – 2015-01-26 (×6): 1 via ORAL
  Filled 2015-01-21 (×6): qty 1

## 2015-01-21 MED ORDER — ROFLUMILAST 500 MCG PO TABS
500.0000 ug | ORAL_TABLET | Freq: Every day | ORAL | Status: DC
  Administered 2015-01-21 – 2015-01-26 (×6): 500 ug via ORAL
  Filled 2015-01-21 (×6): qty 1

## 2015-01-21 MED ORDER — ALPRAZOLAM 0.5 MG PO TABS
0.5000 mg | ORAL_TABLET | Freq: Three times a day (TID) | ORAL | Status: DC | PRN
  Administered 2015-01-21 – 2015-01-25 (×6): 0.5 mg via ORAL
  Filled 2015-01-21 (×6): qty 1

## 2015-01-21 MED ORDER — TIOTROPIUM BROMIDE MONOHYDRATE 18 MCG IN CAPS
1.0000 | ORAL_CAPSULE | Freq: Every day | RESPIRATORY_TRACT | Status: DC
  Administered 2015-01-21: 18 ug via RESPIRATORY_TRACT
  Filled 2015-01-21: qty 5

## 2015-01-21 MED ORDER — ATORVASTATIN CALCIUM 40 MG PO TABS
40.0000 mg | ORAL_TABLET | Freq: Every day | ORAL | Status: DC
  Administered 2015-01-21 – 2015-01-26 (×6): 40 mg via ORAL
  Filled 2015-01-21: qty 2
  Filled 2015-01-21 (×5): qty 1

## 2015-01-21 MED ORDER — ASPIRIN EC 81 MG PO TBEC
81.0000 mg | DELAYED_RELEASE_TABLET | Freq: Every day | ORAL | Status: DC
  Administered 2015-01-21 – 2015-01-26 (×6): 81 mg via ORAL
  Filled 2015-01-21 (×6): qty 1

## 2015-01-21 MED ORDER — FLUCONAZOLE 100 MG PO TABS: 100.0000 mg | ORAL_TABLET | ORAL | Status: DC

## 2015-01-21 MED ORDER — OXYBUTYNIN CHLORIDE 5 MG PO TABS
5.0000 mg | ORAL_TABLET | Freq: Every day | ORAL | Status: DC
  Administered 2015-01-21 – 2015-01-26 (×6): 5 mg via ORAL
  Filled 2015-01-21 (×6): qty 1

## 2015-01-21 MED ORDER — METHYLPREDNISOLONE SODIUM SUCC 125 MG IJ SOLR
60.0000 mg | INTRAMUSCULAR | Status: DC
  Administered 2015-01-21 – 2015-01-26 (×32): 60 mg via INTRAVENOUS
  Filled 2015-01-21 (×32): qty 2

## 2015-01-21 MED ORDER — FLUCONAZOLE 100 MG PO TABS
100.0000 mg | ORAL_TABLET | Freq: Every day | ORAL | Status: DC
  Administered 2015-01-21 – 2015-01-26 (×6): 100 mg via ORAL
  Filled 2015-01-21 (×6): qty 1

## 2015-01-21 MED ORDER — ESCITALOPRAM OXALATE 10 MG PO TABS
20.0000 mg | ORAL_TABLET | Freq: Every day | ORAL | Status: DC
  Administered 2015-01-21 – 2015-01-26 (×6): 20 mg via ORAL
  Filled 2015-01-21 (×6): qty 2

## 2015-01-21 NOTE — Progress Notes (Signed)
PROGRESS NOTE  [name] NFA:[id number] DOB: 1955/10/17 DOA: 01/20/2015 PCP: Cassell Smiles., MD  Summary: 30 yof with a hx of oxygen dependent COPD on 4L [place], HLD and smoking presented with SOB, fever and fatigue. Was found to have marked leukocytosis and was admitted for further evaluation and treatment.    Assessment/Plan: 1. COPD exacerbation. Slow to improve. On roflumilast, Spiriva, Advair, albuterol as an oupatient.  2. Chronic hypoxic respiratory failure, on 4L [place] and BiPAP QHS at home 3. Leukocytosis, possibly chronic. Present 11/2014 when 17-24. This admit 29 >> 24. TSH WNL 4. Oral candidiasis on Diflucan 5. Macrocytic anemia, likely at baseline.  6. PMH SVT   Little improvement thus far though stable on 4L.   Continue steroids, abx, nebs.  F/u B12, folate  Screen for HIV  Possibly home next 48 hours.  Code Status: Full  DVT prophylaxis: Heparin  Family Communication: Discussed with daughter Judeth Cornfield (hospitalist in [place]) and son in law ([facility] physician) at bedside. No questions at this time Disposition Plan: Home once improved. Anticipate discharge in 1-2 days.   Brendia Sacks, MD  [facility]  Pager [phone] If 7PM-7AM, please contact night-coverage at [email or web address], password [facility] 01/21/2015, 6:25 AM  LOS: 1 day   Consultants:    Procedures:    Antibiotics:  Levaquin 9/17 >>  HPI/Subjective: Feeling a little better but still SOB, especially when ambulatory. No cough. No n/v/d or pain. Intermittent pain in back   Objective: Filed Vitals:   01/20/15 2330 01/21/15 0010 01/21/15 0154 01/21/15 0524  BP: 122/65 121/61  108/60  Pulse: 88 92 87 81  Temp:  98.7 F (37.1 C)  98 F (36.7 C)  TempSrc:  Oral  Oral  Resp:  18 21 20   Height:  5\' 2"  (1.575 m)    Weight:  49.85 kg (109 lb 14.4 oz)    SpO2: 95% 92% 95% 91%   No intake or output data in the 24 hours ending 01/21/15 0625   Filed Weights   01/20/15 1816 01/21/15 0010    Weight: 48.535 kg (107 lb) 49.85 kg (109 lb 14.4 oz)    Exam:     Afebrile, no hypoxia  General:  Appears calm and comfortable, sitting up in bed Eyes: PERRL, normal lids, irises & conjunctiva ENT: grossly normal hearing, lips & tongue. White exudate on back of throat.  Cardiovascular: distant RRR, no m/r/g. No LE edema. Respiratory: CTA bilaterally, no r/r. Expiratory wheezing, decreased breath sounds.Speaks in short sentences.  Musculoskeletal: grossly normal tone BUE/BLE Psychiatric: grossly normal mood and affect, speech fluent and appropriate  New data reviewed:  Lactic acid WNL  BMP normal  Pertinent data since admission:  CXR: IMPRESSION: Emphysema. No focal consolidation or pneumothorax.  BNP and troponin WNL  LA WNL  Pending data:  UC  GAS culture  Scheduled Meds: . aspirin EC  81 mg Oral Daily  . atorvastatin  40 mg Oral Daily  . clonazePAM  0.5 mg Oral QHS  . diltiazem  180 mg Oral Daily  . escitalopram  20 mg Oral Daily  . [START ON 01/23/2015] fluconazole  100 mg Oral Q Mon-1800  . heparin  5,000 Units Subcutaneous 3 times per day  . levofloxacin  750 mg Intravenous Q24H  . methylPREDNISolone (SOLU-MEDROL) injection  60 mg Intravenous Q4H  . mometasone-formoterol  2 puff Inhalation BID  . multivitamin with minerals  1 tablet Oral Daily  . oxybutynin  5 mg Oral Daily  . roflumilast  500  mcg Oral Daily  . sodium chloride  3 mL Intravenous Q12H  . tiotropium  1 capsule Inhalation Daily   Continuous Infusions:   Principal Problem:   COPD exacerbation Active Problems:   Hyperlipidemia   Acute on chronic respiratory failure   Thrush, oral   Chronic respiratory failure with hypoxia   Leukocytosis   Macrocytic anemia   Time spent 25 minutes  By signing my name below, I, Arielle Khosrowpour, attest that this documentation has been prepared under the direction and in the presence of Daniel P. Irene Limbo, MD. Electronically signed: Dawayne Cirri. 01/21/2015  I personally performed the services described in this documentation. All medical record entries made by the scribe were at my direction. I have reviewed the chart and agree that the record reflects my personal performance and is accurate and complete. Brendia Sacks, MD

## 2015-01-21 NOTE — Progress Notes (Signed)
Utilization review Completed Kimberly Welborn RN BSN   

## 2015-01-21 NOTE — Plan of Care (Signed)
Problem: ICU Phase Progression Outcomes Goal: Flu/PneumoVaccines if indicated Outcome: Completed/Met Date Met:  01/21/15 Patient is up to date on these vaccines

## 2015-01-22 LAB — CBC
HEMATOCRIT: 33.1 % — AB (ref 36.0–46.0)
HEMOGLOBIN: 10.5 g/dL — AB (ref 12.0–15.0)
MCH: 31.8 pg (ref 26.0–34.0)
MCHC: 31.7 g/dL (ref 30.0–36.0)
MCV: 100.3 fL — AB (ref 78.0–100.0)
Platelets: 311 10*3/uL (ref 150–400)
RBC: 3.3 MIL/uL — AB (ref 3.87–5.11)
RDW: 13.3 % (ref 11.5–15.5)
WBC: 15.5 10*3/uL — ABNORMAL HIGH (ref 4.0–10.5)

## 2015-01-22 MED ORDER — NYSTATIN 100000 UNIT/ML MT SUSP
5.0000 mL | Freq: Four times a day (QID) | OROMUCOSAL | Status: DC
  Administered 2015-01-22 – 2015-01-26 (×16): 500000 [IU] via ORAL
  Filled 2015-01-22 (×16): qty 5

## 2015-01-22 MED ORDER — FLUTICASONE PROPIONATE 50 MCG/ACT NA SUSP
1.0000 | Freq: Every day | NASAL | Status: DC
  Administered 2015-01-22 – 2015-01-26 (×5): 1 via NASAL
  Filled 2015-01-22: qty 16

## 2015-01-22 MED ORDER — TRIAMCINOLONE ACETONIDE 55 MCG/ACT NA AERO: 1.0000 | INHALATION_SPRAY | Freq: Every day | NASAL | Status: DC

## 2015-01-22 NOTE — Progress Notes (Signed)
PROGRESS NOTE  [name]:[id number] DOB: 23-Dec-1955 DOA: 01/20/2015 PCP: Cassell Smiles., MD  Summary: 57 yof with a hx of oxygen dependent COPD, on 4L [place] at home and tobacco abuse presented with SOB, fever and fatigue. Was found to have marked leukocytosis in the ED and was admitted for further evaluation and treatment.    Assessment/Plan: 1. COPD exacerbation, no significant improvement. Remains dyspneic and speaking in short sentences. CXR consistent with emphysema. On roflumilast, Spiriva, Advair, albuterol as an oupatient.  2. Chronic hypoxic respiratory failure, on 4L [place] and BiPAP QHS at home. Oxygen requirement stable.  3. Leukocytosis, possibly chronic. Improving.     4. Oral candidiasis, on Diflucan, worsening, likely secondary to antibiotic and steroids. Will begin on Nystatin in addition to the Diflucan.  5. Macrocytic anemia, stable.  6. Maxillary sinus pain, postnasal drainage. 7. PMH SVT, stable.    No significant change today. Continue steroids, nebulizers, and abx.   Add Nystatin, nasal steroid  CBC in the morning   Code Status: Full  DVT prophylaxis: Heparin  Family Communication: No family at bedside. Discussed with patient who understands and has no concerns at this time. Disposition Plan: Anticipate discharge within 2-3 days.   Brendia Sacks, MD  [facility]  Pager [phone] If 7PM-7AM, please contact night-coverage at [email or web address], password [facility] 01/22/2015, 7:24 AM  LOS: 2 days   Consultants:    Procedures:    Antibiotics:  Levaquin 9/17 >>  HPI/Subjective: Does not feel well.  Feels very weak and SOB/cough not improved. Able to sleep last night and eat breakfast this morning. Gets short of breath when speaking. Feels like thrush is worse. Lot of nasal drainage.  Objective: Filed Vitals:   01/21/15 1922 01/21/15 2110 01/21/15 2306 01/22/15 0544  BP:  114/64  98/71  Pulse:  88 82 66  Temp:  98.1 F (36.7 C)  97.7 F (36.5  C)  TempSrc:  Oral  Axillary  Resp:  18 18 18   Height:      Weight:      SpO2: 97% 90% 96% 95%    Intake/Output Summary (Last 24 hours) at 01/22/15 0724 Last data filed at 01/22/15 0546  Gross per 24 hour  Intake    480 ml  Output   1150 ml  Net   -670 ml     Filed Weights   01/20/15 1816 01/21/15 0010  Weight: 48.535 kg (107 lb) 49.85 kg (109 lb 14.4 oz)    Exam:    VSS, afebrile, not hypoxic General:  Appears mildly uncomfortable, calm, ill but not toxic. Eyes: PERRL, normal lids, irises ENT: increased exudate soft palate Cardiovascular: Regular rate and rhythm, no murmur, rub or gallop. No lower extremity edema. Distant.  Respiratory:  Poor air movement, some audible wheezing, few rhonchi, few rales left ase. Moderate increased respiratory effort, dyspneic with moderately increased respiratory effort. Speaking in short sentences Abdomen: soft, ntnd Musculoskeletal: grossly normal tone bilateral upper and lower extremities Psychiatric: grossly normal mood and affect, speech fluent and appropriate  New data reviewed:  WBC improving, 15.5  Pertinent data since admission:  CXR: IMPRESSION: Emphysema. No focal consolidation or pneumothorax.  Pending data:  UC  GAS culture  Scheduled Meds: . aspirin EC  81 mg Oral Daily  . atorvastatin  40 mg Oral Daily  . clonazePAM  0.5 mg Oral QHS  . diltiazem  180 mg Oral Daily  . escitalopram  20 mg Oral Daily  . fluconazole  100 mg Oral  Daily  . heparin  5,000 Units Subcutaneous 3 times per day  . ipratropium-albuterol  3 mL Nebulization Q4H  . levofloxacin  750 mg Intravenous Q24H  . methylPREDNISolone (SOLU-MEDROL) injection  60 mg Intravenous Q4H  . mometasone-formoterol  2 puff Inhalation BID  . multivitamin with minerals  1 tablet Oral Daily  . oxybutynin  5 mg Oral Daily  . roflumilast  500 mcg Oral Daily  . sodium chloride  3 mL Intravenous Q12H   Continuous Infusions:   Principal Problem:   COPD  exacerbation Active Problems:   Hyperlipidemia   Acute on chronic respiratory failure   Thrush, oral   Chronic respiratory failure with hypoxia   Leukocytosis   Macrocytic anemia   Time spent 20 minutes  By signing my name below, I, Burnett Harry attest that this documentation has been prepared under the direction and in the presence of Brendia Sacks, MD Electronically signed: Burnett Harry, Scribe.  01/22/2015   I personally performed the services described in this documentation. All medical record entries made by the scribe were at my direction. I have reviewed the chart and agree that the record reflects my personal performance and is accurate and complete. Brendia Sacks, MD

## 2015-01-23 LAB — URINE CULTURE

## 2015-01-23 LAB — CBC
HEMATOCRIT: 35.2 % — AB (ref 36.0–46.0)
HEMOGLOBIN: 11.1 g/dL — AB (ref 12.0–15.0)
MCH: 31.6 pg (ref 26.0–34.0)
MCHC: 31.5 g/dL (ref 30.0–36.0)
MCV: 100.3 fL — AB (ref 78.0–100.0)
Platelets: 391 10*3/uL (ref 150–400)
RBC: 3.51 MIL/uL — ABNORMAL LOW (ref 3.87–5.11)
RDW: 13.3 % (ref 11.5–15.5)
WBC: 15.8 10*3/uL — AB (ref 4.0–10.5)

## 2015-01-23 LAB — CULTURE, GROUP A STREP: Strep A Culture: NEGATIVE

## 2015-01-23 LAB — FOLATE RBC
Folate, Hemolysate: >620 ng/mL
Hematocrit: 33.2 % — ABNORMAL LOW (ref 34.0–46.6)

## 2015-01-23 MED ORDER — IPRATROPIUM-ALBUTEROL 0.5-2.5 (3) MG/3ML IN SOLN
3.0000 mL | Freq: Four times a day (QID) | RESPIRATORY_TRACT | Status: DC
  Administered 2015-01-23 (×3): 3 mL via RESPIRATORY_TRACT
  Filled 2015-01-23 (×3): qty 3

## 2015-01-23 MED ORDER — GUAIFENESIN ER 600 MG PO TB12
1200.0000 mg | ORAL_TABLET | Freq: Two times a day (BID) | ORAL | Status: DC
  Administered 2015-01-23 – 2015-01-26 (×7): 1200 mg via ORAL
  Filled 2015-01-23 (×8): qty 2

## 2015-01-23 MED ORDER — LEVOFLOXACIN 750 MG PO TABS
750.0000 mg | ORAL_TABLET | Freq: Every day | ORAL | Status: DC
  Administered 2015-01-23 – 2015-01-25 (×3): 750 mg via ORAL
  Filled 2015-01-23 (×3): qty 1

## 2015-01-23 MED ORDER — IPRATROPIUM-ALBUTEROL 0.5-2.5 (3) MG/3ML IN SOLN
3.0000 mL | Freq: Three times a day (TID) | RESPIRATORY_TRACT | Status: DC
  Administered 2015-01-24 – 2015-01-25 (×6): 3 mL via RESPIRATORY_TRACT
  Filled 2015-01-23 (×8): qty 3

## 2015-01-23 NOTE — Progress Notes (Addendum)
PROGRESS NOTE  [name] HQI:[id number] DOB: 04-21-1956 DOA: 01/20/2015 PCP: Cassell Smiles., MD  Summary: 58 yof with a hx of oxygen dependent COPD, on 4L [place] at home and tobacco abuse presented with SOB, fever and fatigue. Was found to have marked leukocytosis in the ED and was admitted for COPD exacerbation. Has been slow to improve. Followed by pulmonology. Home when respiratory status improved.  Assessment/Plan: 1. COPD exacerbation, very modest improvement. CXR consistent with emphysema. On roflumilast, Spiriva, Advair, albuterol as an outpatient.  2. Chronic hypoxic respiratory failure, on 4L [place] and BiPAP QHS at home. Oxygen requirement stable.  3. Leukocytosis, possibly chronic. Unchanged. Remains afebrile. UC and GAS throat cultures unremarkable. 4. Oral candidiasis  likely secondary to antibiotic and steroids, improving with Diflucan and Nystatin..  5. Macrocytic anemia, stable.  6. Maxillary sinus pain, postnasal drainage. Improving with steroid and Mucinex.  7. PMH SVT, stable.    Some modest improvement, continue steroids and breathing treatments. Appreciate pulmonology recommendations.  Improvement in sinus pain with antibiotic, steroids, and Mucinex.  If continue to improve, hopefully discharge home in the next 2-3 days.   Code Status: Full  DVT prophylaxis: Heparin  Family Communication: No family at bedside. Discussed with patient who understands and has no concerns at this time. Disposition Plan: Anticipate discharge within 48 hours.   Brendia Sacks, MD  [facility]  Pager [phone] If 7PM-7AM, please contact night-coverage at [email or web address], password [facility] 01/23/2015, 8:06 AM  LOS: 3 days   Consultants:    Procedures:    Antibiotics:  Levaquin 9/17 >>  HPI/Subjective: Breathing may be slightly improved, worse when she's moving around. Has a cough now which she believes is helping to clear things up in her chest. Thrush somewhat improved.     Objective: Filed Vitals:   01/22/15 2258 01/22/15 2259 01/23/15 0509 01/23/15 0732  BP:   107/54   Pulse:  86 67   Temp:   98 F (36.7 C)   TempSrc:   Oral   Resp:  18 18   Height:      Weight:      SpO2: 96% 96% 97% 98%    Intake/Output Summary (Last 24 hours) at 01/23/15 0806 Last data filed at 01/23/15 0500  Gross per 24 hour  Intake   2093 ml  Output   1800 ml  Net    293 ml     Filed Weights   01/20/15 1816 01/21/15 0010  Weight: 48.535 kg (107 lb) 49.85 kg (109 lb 14.4 oz)    Exam:    VSS, afebrile, not hypoxic General:  Appears calm and comfortable. SOB but not toxic ENT: grossly normal hearing, white exudate on soft palate but less than yesterday Cardiovascular: RRR, no m/r/g. No LE edema. Respiratory: diminished breath sounds, poor air movement, few wheezes. No significant change. Able to speak in short sentences, mild increased respiratory effort. Psychiatric: grossly normal mood and affect, speech fluent and appropriate  New data reviewed:  WBC 15.8, unchanged.  Hgb 11.1  Pertinent data since admission:  CXR: IMPRESSION: Emphysema. No focal consolidation or pneumothorax.  Pending data:  UC  GAS culture  Scheduled Meds: . aspirin EC  81 mg Oral Daily  . atorvastatin  40 mg Oral Daily  . clonazePAM  0.5 mg Oral QHS  . diltiazem  180 mg Oral Daily  . escitalopram  20 mg Oral Daily  . fluconazole  100 mg Oral Daily  . fluticasone  1 spray Each Nare Daily  .  heparin  5,000 Units Subcutaneous 3 times per day  . ipratropium-albuterol  3 mL Nebulization Q6H  . levofloxacin  750 mg Intravenous Q24H  . methylPREDNISolone (SOLU-MEDROL) injection  60 mg Intravenous Q4H  . multivitamin with minerals  1 tablet Oral Daily  . nystatin  5 mL Oral QID  . oxybutynin  5 mg Oral Daily  . roflumilast  500 mcg Oral Daily  . sodium chloride  3 mL Intravenous Q12H   Continuous Infusions:   Principal Problem:   COPD exacerbation Active Problems:    Hyperlipidemia   Acute on chronic respiratory failure   Thrush, oral   Chronic respiratory failure with hypoxia   Leukocytosis   Macrocytic anemia   Time spent 25 minutes  By signing my name below, I, Burnett Harry attest that this documentation has been prepared under the direction and in the presence of Brendia Sacks, MD Electronically signed: Burnett Harry, Scribe.  01/23/2015  I personally performed the services described in this documentation. All medical record entries made by the scribe were at my direction. I have reviewed the chart and agree that the record reflects my personal performance and is accurate and complete. Brendia Sacks, MD

## 2015-01-23 NOTE — Consult Note (Signed)
Consult requested by: [facility] Consult requested for COPD exacerbation/acute on chronic respiratory failure:  HPI: This is a 59 year old who has a long known history of severe COPD. She said that she was in her usual state of poor health at home when she developed increasing shortness of breath cough and congestion. She came to the emergency department eventually was noted to have what appears to be COPD exacerbation and she has been on steroids antibiotics inhaled bronchodilators since. She says she feels a little better. She feels like her cough is a little bit looser.  Past Medical History  Diagnosis Date  . COPD (chronic obstructive pulmonary disease)     on home o2 4L[place]  . Hypoglycemia   . SVT (supraventricular tachycardia)   . Tachycardia   . Complication of anesthesia     o2 sats dropped after back surgery  . Shortness of breath   . IBS (irritable bowel syndrome)   . Diverticulosis   . Hyperlipidemia      Family History  Problem Relation Age of Onset  . Asthma Mother   . Heart attack Mother   . Diabetes Mother   . Heart failure Mother      Social History   Social History  . Marital Status: Divorced    Spouse Name: N/A  . Number of Children: N/A  . Years of Education: N/A   Social History Main Topics  . Smoking status: Former Smoker -- 1.00 packs/day for 40 years    Types: Cigarettes    Quit date: 05/07/2011  . Smokeless tobacco: Never Used     Comment: presently smoking about 2 a day  . Alcohol Use: 0.0 oz/week    0 Standard drinks or equivalent per week     Comment: rare  . Drug Use: No  . Sexual Activity: Not Asked   Other Topics Concern  . None   Social History Narrative     ROS: She's not had any chest pain hemoptysis night sweats or fever or weight loss    Objective: Vital signs in last 24 hours: Temp:  [97.9 F (36.6 C)-98.6 F (37 C)] 98 F (36.7 C) (09/19 0509) Pulse Rate:  [67-88] 67 (09/19 0509) Resp:  [18] 18 (09/19  0509) BP: (107-128)/(51-64) 107/54 mmHg (09/19 0509) SpO2:  [91 %-98 %] 98 % (09/19 0732) Weight change:  Last BM Date: 01/22/15  Intake/Output from previous day: 09/18 0701 - 09/19 0700 In: 2093 [P.O.:1640; I.V.:3; IV Piggyback:450] Out: 1800 [Urine:1800]  PHYSICAL EXAM She's awake and alert. She is on BiPAP. She looks pretty comfortable. Her pupils are reactive nose and throat are clear neck is supple without masses her chest shows diminished breath sounds and she is still somewhat "tight". Her heart is regular with distant heart sounds. Her abdomen is soft without masses bowel sounds are present and active. She has no clubbing cyanosis or edema of the extremities and her central nervous system examination is grossly intact  Lab Results: Basic Metabolic Panel:  Recent Labs  01/21/15 0448  NA 139  K 4.7  CL 100*  CO2 34*  GLUCOSE 134*  BUN 9  CREATININE 0.63  CALCIUM 8.1*   Liver Function Tests: No results for input(s): AST, ALT, ALKPHOS, BILITOT, PROT, ALBUMIN in the last 72 hours. No results for input(s): LIPASE, AMYLASE in the last 72 hours. No results for input(s): AMMONIA in the last 72 hours. CBC:  Recent Labs  01/20/15 1906  01/22/15 0551 01/23/15 0634  WBC 29.1*  < >  15.5* 15.8*  NEUTROABS 27.1*  --   --   --   HGB 12.3  < > 10.5* 11.1*  HCT 37.9  < > 33.1* 35.2*  MCV 100.5*  < > 100.3* 100.3*  PLT 253  < > 311 391  < > = values in this interval not displayed. Cardiac Enzymes: No results for input(s): CKTOTAL, CKMB, CKMBINDEX, TROPONINI in the last 72 hours. BNP: No results for input(s): PROBNP in the last 72 hours. D-Dimer: No results for input(s): DDIMER in the last 72 hours. CBG: No results for input(s): GLUCAP in the last 72 hours. Hemoglobin A1C: No results for input(s): HGBA1C in the last 72 hours. Fasting Lipid Panel: No results for input(s): CHOL, HDL, LDLCALC, TRIG, CHOLHDL, LDLDIRECT in the last 72 hours. Thyroid Function Tests:  Recent  Labs  01/21/15 0448  TSH 0.574   Anemia Panel:  Recent Labs  01/21/15 0448  VITAMINB12 775   Coagulation:  Recent Labs  01/20/15 1906  LABPROT 13.5  INR 1.01   Urine Drug Screen: Drugs of Abuse  No results found for: LABOPIA, COCAINSCRNUR, LABBENZ, AMPHETMU, THCU, LABBARB  Alcohol Level: No results for input(s): ETH in the last 72 hours. Urinalysis:  Recent Labs  01/20/15 1957  COLORURINE YELLOW  LABSPEC 1.010  PHURINE 6.5  GLUCOSEU NEGATIVE  HGBUR MODERATE*  BILIRUBINUR NEGATIVE  KETONESUR NEGATIVE  PROTEINUR NEGATIVE  UROBILINOGEN 0.2  NITRITE NEGATIVE  LEUKOCYTESUR NEGATIVE   Misc. Labs:   ABGS:  Recent Labs  01/20/15 1850  TCO2 15.3  HCO3 31.7*     MICROBIOLOGY: Recent Results (from the past 240 hour(s))  Rapid strep screen     Status: None   Collection Time: 01/20/15  7:06 PM  Result Value Ref Range Status   Streptococcus, Group A Screen (Direct) NEGATIVE NEGATIVE Final    Comment: (NOTE) A Rapid Antigen test may result negative if the antigen level in the sample is below the detection level of this test. The FDA has not cleared this test as a stand-alone test therefore the rapid antigen negative result has reflexed to a Group A Strep culture.   Urine culture     Status: None (Preliminary result)   Collection Time: 01/20/15  7:57 PM  Result Value Ref Range Status   Specimen Description URINE, CLEAN CATCH  Final   Special Requests NONE  Final   Culture   Final    NO GROWTH < 24 HOURS Performed at [facility]    Report Status PENDING  Incomplete    Studies/Results: No results found.  Medications:  Prior to Admission:  Prescriptions prior to admission  Medication Sig Dispense Refill Last Dose  . albuterol (PROAIR HFA) 108 (90 BASE) MCG/ACT inhaler Inhale 2 puffs into the lungs every 6 (six) hours as needed. For shortness of breath   Past Week at Unknown time  . ALPRAZolam (XANAX) 0.5 MG tablet Take 0.5 mg by mouth daily  as needed. anxiety   Past Week at Unknown time  . aspirin 81 MG tablet Take 81 mg by mouth daily.   01/20/2015 at Unknown time  . atorvastatin (LIPITOR) 40 MG tablet Take 1 tablet (40 mg total) by mouth daily. 90 tablet 3 01/19/2015 at Unknown time  . cetirizine (ZYRTEC) 10 MG tablet Take 10 mg by mouth daily.     01/20/2015 at Unknown time  . clonazePAM (KLONOPIN) 0.5 MG tablet Take 0.5 mg by mouth at bedtime.   01/19/2015 at Unknown time  . diltiazem (  CARDIZEM CD) 180 MG 24 hr capsule TAKE 1 CAPSULE BY MOUTH DAILY 90 capsule 3 01/20/2015 at Unknown time  . escitalopram (LEXAPRO) 20 MG tablet Take 20 mg by mouth daily.     01/20/2015 at Unknown time  . fluconazole (DIFLUCAN) 100 MG tablet Take 1 tablet (100 mg total) by mouth daily. (Patient taking differently: Take 100 mg by mouth every Monday at 6 PM. ) 9 tablet 0 01/20/2015 at Unknown time  . Fluticasone-Salmeterol (ADVAIR) 250-50 MCG/DOSE AEPB Inhale 1 puff into the lungs 2 (two) times daily.    01/20/2015 at Unknown time  . ibuprofen (ADVIL,MOTRIN) 200 MG tablet Take 400 mg by mouth 2 (two) times daily as needed for mild pain or moderate pain.   01/20/2015 at Unknown time  . Multiple Vitamins-Minerals (MULTIVITAMINS THER. W/MINERALS) TABS Take 1 tablet by mouth daily.     01/20/2015 at Unknown time  . oxybutynin (DITROPAN) 5 MG tablet Take 5 mg by mouth daily.    01/20/2015 at Unknown time  . predniSONE (DELTASONE) 10 MG tablet Take 1 tablet (10 mg total) by mouth daily. Take 40 mg x 3 days then 30 mg x 3 days then 20 mg x 3 days then resume maintenance dose 10 mg daily (Patient taking differently: Take 10 mg by mouth daily. ) 30 tablet 2 01/20/2015 at Unknown time  . roflumilast (DALIRESP) 500 MCG TABS tablet Take 500 mcg by mouth daily.     01/20/2015 at Unknown time  . SPIRIVA RESPIMAT 2.5 MCG/ACT AERS Inhale 1 puff into the lungs daily.    01/20/2015 at Unknown time  . triamcinolone (NASACORT ALLERGY 24HR) 55 MCG/ACT AERO nasal inhaler Place 2 sprays into  the nose daily as needed (FOR ALLERGIES).   Past Month at Unknown time  . fluconazole (DIFLUCAN) 150 MG tablet Take 150 mg by mouth once a week.    01/16/2015  . levofloxacin (LEVAQUIN) 750 MG tablet Take 1 tablet (750 mg total) by mouth daily. (Patient not taking: Reported on 01/20/2015) 3 tablet 0    Scheduled: . aspirin EC  81 mg Oral Daily  . atorvastatin  40 mg Oral Daily  . clonazePAM  0.5 mg Oral QHS  . diltiazem  180 mg Oral Daily  . escitalopram  20 mg Oral Daily  . fluconazole  100 mg Oral Daily  . fluticasone  1 spray Each Nare Daily  . guaiFENesin  1,200 mg Oral BID  . heparin  5,000 Units Subcutaneous 3 times per day  . ipratropium-albuterol  3 mL Nebulization Q6H  . levofloxacin  750 mg Intravenous Q24H  . methylPREDNISolone (SOLU-MEDROL) injection  60 mg Intravenous Q4H  . multivitamin with minerals  1 tablet Oral Daily  . nystatin  5 mL Oral QID  . oxybutynin  5 mg Oral Daily  . roflumilast  500 mcg Oral Daily  . sodium chloride  3 mL Intravenous Q12H   Continuous:  VZS:MOLMBEMLJ, ALPRAZolam  Assesment: She was admitted with COPD exacerbation. She has acute on chronic respiratory failure and she is improving. At baseline she is on maximum treatment and I don't think there is really anything to add Principal Problem:   COPD exacerbation Active Problems:   Hyperlipidemia   Acute on chronic respiratory failure   Thrush, oral   Chronic respiratory failure with hypoxia   Leukocytosis   Macrocytic anemia    Plan: Continue current treatments    LOS: 3 days   Myrtle Barnhard L 01/23/2015, 9:16 AM

## 2015-01-23 NOTE — Care Management Note (Signed)
Case Management Note  Patient Details  Name: [name] MRN: [id number] Date of Birth: [birth year]  Expected Discharge Date:    01/25/2015              Expected Discharge Plan:  [place]  In-House Referral:  NA  Discharge planning Services  CM Consult  Post Acute Care Choice:  Resumption of Svcs/PTA Provider Choice offered to:  Patient  DME Arranged:    DME Agency:     HH Arranged:  RN, Nurse's Aide [place] Agency:  [facility]  Status of Service:  In process, will continue to follow  Medicare Important Message Given:    Date Medicare IM Given:    Medicare IM give by:    Date Additional Medicare IM Given:    Additional Medicare Important Message give by:     If discussed at [place] of Stay Meetings, dates discussed:    Additional Comments: Pt is from home, lives alone and most independent with ADL's. Pt is active with [facility] for RN services. PT and aid services have recently been stopped. Pt says she really struggles with bathing and would like it if the aid could be resumed at DC. Explained to patient that the aid can only come as long as the nurse is coming, if pt requires aid longer than that, she will need to discuss other options such as PD care or ALF. Pt verbalizes understanding. Romualdo Bolk, of [facility] made aware of admission and will obtain pt info from chart. Pt has home O2, BIPAP, neb machine prior to admission. Pt plans to return home with resumptions of [place] services. Will cont to follow for DC planning.  Sherald Barge, RN 01/23/2015, 11:41 AM

## 2015-01-24 ENCOUNTER — Inpatient Hospital Stay (HOSPITAL_COMMUNITY): Payer: Medicare Other

## 2015-01-24 LAB — HIV ANTIBODY (ROUTINE TESTING W REFLEX): HIV Screen 4th Generation wRfx: NONREACTIVE

## 2015-01-24 MED ORDER — MONTELUKAST SODIUM 10 MG PO TABS
10.0000 mg | ORAL_TABLET | Freq: Every day | ORAL | Status: DC
  Administered 2015-01-24 – 2015-01-25 (×2): 10 mg via ORAL
  Filled 2015-01-24 (×2): qty 1

## 2015-01-24 MED ORDER — SALINE SPRAY 0.65 % NA SOLN: 1.0000 | NASAL | Status: DC | PRN

## 2015-01-24 NOTE — Evaluation (Signed)
Physical Therapy Evaluation Patient Details Name: [name] MRN: [id number] DOB: 01-Aug-1955 Today's Date: 01/24/2015   History of Present Illness  [name] is an 59 y.o. female with hx of severe COPD, prior smoker, on 4L [place] home oxygen, hx of CAD, hx of SVT, HLD, presented to the ER with increase SOB, feeling feverish, and general fatigue, requiring increase oxygen use. She was found to have a marked leukocytosis with WBC of 30K, and MVC of 100. She has had chronic leukocytosis on steroids, but not this high. Evaluation included a CXR showing no infiltrate, and her bicarb was elevated to 30's. Hospitalist was asked to admit her for further evaluation and tx. I remember her as I had taken care of her before. She sees Dr Egbert Garibaldi of pulmonary outpatient, and she had seen Dr Blase Mess shortly during her last bout. EDP spoke with both of her children tonight, one is a hospitalist in [employer], the other the a radiologist in [facility]. It was decided that we will bring her in for COPD exacerbation, though she was not in significant respiratory distress. She does use her CPAP/Bipap every night at home.  Clinical Impression   Pt reports feeling better with hospital stay.  She states that she has been doing fairly well at home until this recent episode.  She is currently on 4 L O2 with resting O2 sat=98%.  Her strength and balance are WNL and she is independent with transfers.  I reviewed energy conservation with her and recommend that she use her walker at home for awhile.  She was able to ambulate 150' with a rolling walker, good stability with O2 at 4 L/min.  O2 sat after gait=93%.  We will transfer gait over to nursing service.    Follow Up Recommendations No PT follow up    Equipment Recommendations  None recommended by PT    Recommendations for Other Services       Precautions / Restrictions Precautions Precautions: None Restrictions Weight Bearing Restrictions: No      Mobility  Bed  Mobility Overal bed mobility: Independent                Transfers Overall transfer level: Independent                  Ambulation/Gait Ambulation/Gait assistance: Modified independent (Device/Increase time) Ambulation Distance (Feet): 150 Feet Assistive device: Rolling walker (2 wheeled) Gait Pattern/deviations: WFL(Within Functional Limits) Gait velocity: appropriate for situation Gait velocity interpretation: <1.8 ft/sec, indicative of risk for recurrent falls General Gait Details: O2 on 4 L/min with O2 sat =93% after gait  Stairs            Wheelchair Mobility    Modified Rankin (Stroke Patients Only)       Balance Overall balance assessment: No apparent balance deficits (not formally assessed)                                           Pertinent Vitals/Pain Pain Assessment: No/denies pain    Home Living Family/patient expects to be discharged to:: Private residence Living Arrangements: Alone Available Help at Discharge: Family;Available PRN/intermittently Type of Home: House Home Access: Ramped entrance     Home Layout: One level;Laundry or work area in [employer] Equipment: Environmental consultant - 4 wheels;Grab bars - tub/shower      Prior Function Level of Independence: Independent  Comments: sister assists her with getting groceries and accompanying her to MD appointments     Hand Dominance        Extremity/Trunk Assessment   Upper Extremity Assessment: Overall WFL for tasks assessed           Lower Extremity Assessment: Overall WFL for tasks assessed      Cervical / Trunk Assessment: Normal  Communication      Cognition Arousal/Alertness: Awake/alert Behavior During Therapy: WFL for tasks assessed/performed Overall Cognitive Status: Within Functional Limits for tasks assessed                      General Comments      Exercises        Assessment/Plan    PT Assessment [place] does  not need any further PT services  PT Diagnosis     PT Problem List    PT Treatment Interventions     PT Goals (Current goals can be found in the Care Plan section) Acute Rehab PT Goals PT Goal Formulation: All assessment and education complete, DC therapy    Frequency     Barriers to discharge        Co-evaluation               End of Session Equipment Utilized During Treatment: Gait belt;Oxygen Activity Tolerance: Patient tolerated treatment well Patient left: in bed;with call bell/phone within reach;with family/visitor present           Time: 1610-9604 PT Time Calculation (min) (ACUTE ONLY): 25 min   Charges:   PT Evaluation $Initial PT Evaluation Tier I: 1 Procedure     PT G CodesKonrad Penta  PT 01/24/2015, 3:02 PM [phone]

## 2015-01-24 NOTE — Progress Notes (Signed)
Subjective: She says she feels a little better. She is able to cough up a little bit of sputum. She has no other new complaints.  Objective: Vital signs in last 24 hours: Temp:  [97.4 F (36.3 C)-98.5 F (36.9 C)] 97.4 F (36.3 C) (09/19 2119) Pulse Rate:  [69-82] 69 (09/20 0500) Resp:  [18-22] 18 (09/20 0500) BP: (112-124)/(56-68) 124/64 mmHg (09/20 0500) SpO2:  [93 %-97 %] 93 % (09/20 0500) Weight change:  Last BM Date: 01/22/15  Intake/Output from previous day: 09/19 0701 - 09/20 0700 In: -  Out: 800 [Urine:800]  PHYSICAL EXAM General appearance: alert, cooperative and mild distress Resp: She has diminished breath sounds and prolonged expiration but sounds better and is moving air better than yesterday Cardio: regular rate and rhythm, S1, S2 normal, no murmur, click, rub or gallop GI: soft, non-tender; bowel sounds normal; no masses,  no organomegaly Extremities: extremities normal, atraumatic, no cyanosis or edema  Lab Results:  Results for orders placed or performed during the hospital encounter of 01/20/15 (from the past 48 hour(s))  HIV antibody     Status: None   Collection Time: 01/23/15  6:34 AM  Result Value Ref Range   HIV Screen 4th Generation wRfx Non Reactive Non Reactive    Comment: (NOTE) Performed At: [facility] [place], [place] [id number] Lindon Romp MD JW:[id number]   CBC     Status: Abnormal   Collection Time: 01/23/15  6:34 AM  Result Value Ref Range   WBC 15.8 (H) 4.0 - 10.5 K/uL   RBC 3.51 (L) 3.87 - 5.11 MIL/uL   Hemoglobin 11.1 (L) 12.0 - 15.0 g/dL   HCT 35.2 (L) 36.0 - 46.0 %   MCV 100.3 (H) 78.0 - 100.0 fL   MCH 31.6 26.0 - 34.0 pg   MCHC 31.5 30.0 - 36.0 g/dL   RDW 13.3 11.5 - 15.5 %   Platelets 391 150 - 400 K/uL    ABGS No results for input(s): PHART, PO2ART, TCO2, HCO3 in the last 72 hours.  Invalid input(s): PCO2 CULTURES Recent Results (from the past 240 hour(s))  Rapid strep screen     Status:  None   Collection Time: 01/20/15  7:06 PM  Result Value Ref Range Status   Streptococcus, Group A Screen (Direct) NEGATIVE NEGATIVE Final    Comment: (NOTE) A Rapid Antigen test may result negative if the antigen level in the sample is below the detection level of this test. The FDA has not cleared this test as a stand-alone test therefore the rapid antigen negative result has reflexed to a Group A Strep culture.   Culture, Group A Strep     Status: None   Collection Time: 01/20/15  7:06 PM  Result Value Ref Range Status   Strep A Culture Negative  Final    Comment: (NOTE) Performed At: [facility] [place], [place] [id number] Lindon Romp MD QI:[id number]   Urine culture     Status: None   Collection Time: 01/20/15  7:57 PM  Result Value Ref Range Status   Specimen Description URINE, CLEAN CATCH  Final   Special Requests NONE  Final   Culture   Final    MULTIPLE SPECIES PRESENT, SUGGEST RECOLLECTION Performed at [facility]    Report Status 01/23/2015 FINAL  Final   Studies/Results: No results found.  Medications:  Prior to Admission:  Prescriptions prior to admission  Medication Sig Dispense Refill Last Dose  .  albuterol (PROAIR HFA) 108 (90 BASE) MCG/ACT inhaler Inhale 2 puffs into the lungs every 6 (six) hours as needed. For shortness of breath   Past Week at Unknown time  . ALPRAZolam (XANAX) 0.5 MG tablet Take 0.5 mg by mouth daily as needed. anxiety   Past Week at Unknown time  . aspirin 81 MG tablet Take 81 mg by mouth daily.   01/20/2015 at Unknown time  . atorvastatin (LIPITOR) 40 MG tablet Take 1 tablet (40 mg total) by mouth daily. 90 tablet 3 01/19/2015 at Unknown time  . cetirizine (ZYRTEC) 10 MG tablet Take 10 mg by mouth daily.     01/20/2015 at Unknown time  . clonazePAM (KLONOPIN) 0.5 MG tablet Take 0.5 mg by mouth at bedtime.   01/19/2015 at Unknown time  . diltiazem (CARDIZEM CD) 180 MG 24 hr capsule TAKE 1 CAPSULE BY MOUTH  DAILY 90 capsule 3 01/20/2015 at Unknown time  . escitalopram (LEXAPRO) 20 MG tablet Take 20 mg by mouth daily.     01/20/2015 at Unknown time  . fluconazole (DIFLUCAN) 100 MG tablet Take 1 tablet (100 mg total) by mouth daily. (Patient taking differently: Take 100 mg by mouth every Monday at 6 PM. ) 9 tablet 0 01/20/2015 at Unknown time  . Fluticasone-Salmeterol (ADVAIR) 250-50 MCG/DOSE AEPB Inhale 1 puff into the lungs 2 (two) times daily.    01/20/2015 at Unknown time  . ibuprofen (ADVIL,MOTRIN) 200 MG tablet Take 400 mg by mouth 2 (two) times daily as needed for mild pain or moderate pain.   01/20/2015 at Unknown time  . Multiple Vitamins-Minerals (MULTIVITAMINS THER. W/MINERALS) TABS Take 1 tablet by mouth daily.     01/20/2015 at Unknown time  . oxybutynin (DITROPAN) 5 MG tablet Take 5 mg by mouth daily.    01/20/2015 at Unknown time  . predniSONE (DELTASONE) 10 MG tablet Take 1 tablet (10 mg total) by mouth daily. Take 40 mg x 3 days then 30 mg x 3 days then 20 mg x 3 days then resume maintenance dose 10 mg daily (Patient taking differently: Take 10 mg by mouth daily. ) 30 tablet 2 01/20/2015 at Unknown time  . roflumilast (DALIRESP) 500 MCG TABS tablet Take 500 mcg by mouth daily.     01/20/2015 at Unknown time  . SPIRIVA RESPIMAT 2.5 MCG/ACT AERS Inhale 1 puff into the lungs daily.    01/20/2015 at Unknown time  . triamcinolone (NASACORT ALLERGY 24HR) 55 MCG/ACT AERO nasal inhaler Place 2 sprays into the nose daily as needed (FOR ALLERGIES).   Past Month at Unknown time  . fluconazole (DIFLUCAN) 150 MG tablet Take 150 mg by mouth once a week.    01/16/2015  . levofloxacin (LEVAQUIN) 750 MG tablet Take 1 tablet (750 mg total) by mouth daily. (Patient not taking: Reported on 01/20/2015) 3 tablet 0    Scheduled: . aspirin EC  81 mg Oral Daily  . atorvastatin  40 mg Oral Daily  . clonazePAM  0.5 mg Oral QHS  . diltiazem  180 mg Oral Daily  . escitalopram  20 mg Oral Daily  . fluconazole  100 mg Oral  Daily  . fluticasone  1 spray Each Nare Daily  . guaiFENesin  1,200 mg Oral BID  . heparin  5,000 Units Subcutaneous 3 times per day  . ipratropium-albuterol  3 mL Nebulization TID  . levofloxacin  750 mg Oral QHS  . methylPREDNISolone (SOLU-MEDROL) injection  60 mg Intravenous Q4H  . multivitamin with minerals  1 tablet Oral Daily  . nystatin  5 mL Oral QID  . oxybutynin  5 mg Oral Daily  . roflumilast  500 mcg Oral Daily  . sodium chloride  3 mL Intravenous Q12H   Continuous:  FEO:[id number], ALPRAZolam  Assesment: She is admitted with COPD exacerbation and is slowly improving. She is on maximum therapy. She has chronic hypoxic respiratory therapy at baseline Principal Problem:   COPD exacerbation Active Problems:   Hyperlipidemia   Acute on chronic respiratory failure   Thrush, oral   Chronic respiratory failure with hypoxia   Leukocytosis   Macrocytic anemia    Plan: Continue current treatments.    LOS: 4 days   HAWKINS,EDWARD L 01/24/2015, 8:52 AM

## 2015-01-24 NOTE — Progress Notes (Signed)
PROGRESS NOTE  [name] VZD:[id number] DOB: [birth year] DOA: 01/20/2015 PCP: Cassell Smiles., MD  Summary: 65 yof with a hx of oxygen dependent COPD, on 4L [place] at home and tobacco abuse presented with SOB, fever and fatigue. Was found to have marked leukocytosis in the ED and was admitted for COPD exacerbation. Has been slow to improve. Followed by pulmonology. Home when respiratory status improved.  Assessment/Plan: 1. COPD exacerbation, very modest improvement. CXR consistent with emphysema. On roflumilast, Spiriva, Advair, albuterol as an outpatient. Still on iv steroids, consider taper to home dose steroids if continued improvement, on levaquin. 2. Chronic hypoxic respiratory failure, on 4L [place] and BiPAP QHS at home. Oxygen requirement stable. Reported baseline DOE even with a few steps. 3. Leukocytosis, possibly chronic. On chronic prednisone 10mg  po qd at home for a year.  Remains afebrile. UC and GAS throat cultures unremarkable. 4. Oral candidiasis  likely secondary to antibiotic and steroids, improving with Diflucan and Nystatin. No thrush onTongue on 9/20, but still reported sore throat. 5. Macrocytic anemia, stable.  6. Maxillary sinus pain, postnasal drainage. Improving with steroid and Mucinex. Reported flonase help. Want to have saline nasal spray as well. 7. PMH SVT, stable. Tolerating albuterol.   Some modest improvement, continue steroids and breathing treatments. Appreciate pulmonology recommendations.  Improvement in sinus pain with antibiotic, steroids, and Mucinex.  If continue to improve, hopefully discharge home in the next 2-3 days.   Code Status: Full  DVT prophylaxis: Heparin  Family Communication: No family at bedside. Discussed with patient who understands and has no concerns at this time. Disposition Plan: Anticipate discharge within 48 hours if able to taper steroids and ambulate with PT well, suspect patient will need home health, but seems patient is  reluctant to have home health ( reported not able to do much, baseline sob with even a few steps)  Albertine Grates, MD PhD [facility]  Pager [phone] If 7PM-7AM, please contact night-coverage at [email or web address], password [facility] 01/24/2015, 1:39 PM  LOS: 4 days   Consultants:  pulm  Procedures:  none  Antibiotics:  Levaquin 9/17 >>  HPI/Subjective: Still cough, reported able to cough up easier, Thrush somewhat improved, but still sore throat.  Objective: Filed Vitals:   01/23/15 2345 01/24/15 0500 01/24/15 0855 01/24/15 1321  BP:  124/64  117/59  Pulse: 82 69  85  Temp:    97.2 F (36.2 C)  TempSrc:    Oral  Resp: 18 18  24   Height:      Weight:      SpO2: 96% 93% 98% 98%    Intake/Output Summary (Last 24 hours) at 01/24/15 1339 Last data filed at 01/24/15 1200  Gross per 24 hour  Intake    483 ml  Output   2000 ml  Net  -1517 ml     Filed Weights   01/20/15 1816 01/21/15 0010  Weight: 107 lb (48.535 kg) 109 lb 14.4 oz (49.85 kg)    Exam:    VSS, afebrile, not hypoxic General:  Appears calm and comfortable. SOB but not toxic ENT: grossly normal hearing, white exudate on soft palate but less than yesterday, no thrush on tongue. Cardiovascular: RRR, no m/r/g. No LE edema. Respiratory: poor air movement overall, but no wheezing heard today. No significant change. Able to speak in short sentences, mild increased respiratory effort. Psychiatric: grossly normal mood and affect, speech fluent and appropriate  New data reviewed:  WBC 15.8, unchanged.  Hgb 11.1  Pertinent data since  admission:  CXR: IMPRESSION: Emphysema. No focal consolidation or pneumothorax.  Pending data:  UC  GAS culture  Scheduled Meds: . aspirin EC  81 mg Oral Daily  . atorvastatin  40 mg Oral Daily  . clonazePAM  0.5 mg Oral QHS  . diltiazem  180 mg Oral Daily  . escitalopram  20 mg Oral Daily  . fluconazole  100 mg Oral Daily  . fluticasone  1 spray Each Nare Daily  .  guaiFENesin  1,200 mg Oral BID  . heparin  5,000 Units Subcutaneous 3 times per day  . ipratropium-albuterol  3 mL Nebulization TID  . levofloxacin  750 mg Oral QHS  . methylPREDNISolone (SOLU-MEDROL) injection  60 mg Intravenous Q4H  . montelukast  10 mg Oral QHS  . multivitamin with minerals  1 tablet Oral Daily  . nystatin  5 mL Oral QID  . oxybutynin  5 mg Oral Daily  . roflumilast  500 mcg Oral Daily  . sodium chloride  3 mL Intravenous Q12H   Continuous Infusions:   Principal Problem:   COPD exacerbation Active Problems:   Hyperlipidemia   Acute on chronic respiratory failure   Thrush, oral   Chronic respiratory failure with hypoxia   Leukocytosis   Macrocytic anemia   Time spent 25 minutes  Albertine Grates MD PhD [phone] Please page [facility] night coverage after 7pm.

## 2015-01-25 DIAGNOSIS — J441 Chronic obstructive pulmonary disease with (acute) exacerbation: Principal | ICD-10-CM

## 2015-01-25 DIAGNOSIS — D539 Nutritional anemia, unspecified: Secondary | ICD-10-CM

## 2015-01-25 DIAGNOSIS — B370 Candidal stomatitis: Secondary | ICD-10-CM

## 2015-01-25 DIAGNOSIS — J9611 Chronic respiratory failure with hypoxia: Secondary | ICD-10-CM

## 2015-01-25 DIAGNOSIS — E785 Hyperlipidemia, unspecified: Secondary | ICD-10-CM

## 2015-01-25 MED ORDER — SALINE SPRAY 0.65 % NA SOLN: 1.0000 | NASAL | Status: DC | PRN

## 2015-01-25 MED ORDER — CYCLOBENZAPRINE HCL 10 MG PO TABS
10.0000 mg | ORAL_TABLET | Freq: Once | ORAL | Status: AC
Start: 2015-01-25 — End: 2015-01-25
  Administered 2015-01-25: 10 mg via ORAL
  Filled 2015-01-25: qty 1

## 2015-01-25 MED ORDER — KETOROLAC TROMETHAMINE 30 MG/ML IJ SOLN
30.0000 mg | Freq: Once | INTRAMUSCULAR | Status: AC
  Administered 2015-01-25: 30 mg via INTRAVENOUS
  Filled 2015-01-25: qty 1

## 2015-01-25 MED ORDER — CYCLOBENZAPRINE HCL 10 MG PO TABS: 5.0000 mg | ORAL_TABLET | Freq: Three times a day (TID) | ORAL | Status: DC | PRN

## 2015-01-25 MED ORDER — IBUPROFEN 800 MG PO TABS: 800.0000 mg | ORAL_TABLET | Freq: Four times a day (QID) | ORAL | Status: DC | PRN

## 2015-01-25 NOTE — Progress Notes (Signed)
Subjective: She says she feels some better. Her breathing is a little better  Objective: Vital signs in last 24 hours: Temp:  [97.2 F (36.2 C)-98.4 F (36.9 C)] 98 F (36.7 C) (09/21 0512) Pulse Rate:  [64-92] 64 (09/21 0512) Resp:  [8-24] 20 (09/21 0512) BP: (117-135)/(59-64) 135/61 mmHg (09/21 0512) SpO2:  [92 %-99 %] 92 % (09/21 0855) FiO2 (%):  [4 %] 4 % (09/20 1321) Weight change:  Last BM Date: 01/22/15  Intake/Output from previous day: 09/20 0701 - 09/21 0700 In: 723 [P.O.:720; I.V.:3] Out: 1900 [Urine:1900]  PHYSICAL EXAM General appearance: alert, cooperative and mild distress Resp: I think she is moving air better Cardio: regular rate and rhythm, S1, S2 normal, no murmur, click, rub or gallop GI: soft, non-tender; bowel sounds normal; no masses,  no organomegaly Extremities: extremities normal, atraumatic, no cyanosis or edema  Lab Results:  No results found for this or any previous visit (from the past 48 hour(s)).  ABGS No results for input(s): PHART, PO2ART, TCO2, HCO3 in the last 72 hours.  Invalid input(s): PCO2 CULTURES Recent Results (from the past 240 hour(s))  Rapid strep screen     Status: None   Collection Time: 01/20/15  7:06 PM  Result Value Ref Range Status   Streptococcus, Group A Screen (Direct) NEGATIVE NEGATIVE Final    Comment: (NOTE) A Rapid Antigen test may result negative if the antigen level in the sample is below the detection level of this test. The FDA has not cleared this test as a stand-alone test therefore the rapid antigen negative result has reflexed to a Group A Strep culture.   Culture, Group A Strep     Status: None   Collection Time: 01/20/15  7:06 PM  Result Value Ref Range Status   Strep A Culture Negative  Final    Comment: (NOTE) Performed At: [facility] [place], [place] [id number] Lindon Romp MD SW:[id number]   Urine culture     Status: None   Collection Time: 01/20/15  7:57 PM   Result Value Ref Range Status   Specimen Description URINE, CLEAN CATCH  Final   Special Requests NONE  Final   Culture   Final    MULTIPLE SPECIES PRESENT, SUGGEST RECOLLECTION Performed at [facility]    Report Status 01/23/2015 FINAL  Final   Studies/Results: Dg Chest 2 View  01/24/2015   CLINICAL DATA:  Shortness of breath, congestion, cough  EXAM: CHEST  2 VIEW  COMPARISON:  01/20/2015  FINDINGS: Cardiomediastinal silhouette is stable. Hyperinflation and chronic interstitial prominence again noted. Calcified granuloma in right midlung laterally again noted. Stable scarring in left base. No acute infiltrate or pulmonary edema. Thoracic spine osteopenia.  IMPRESSION: Stable hyperinflation and chronic interstitial prominence. No definite superimposed infiltrate or pulmonary edema.   Electronically Signed   By: [place] Crocker M.D.   On: 01/24/2015 14:26    Medications:  Prior to Admission:  Prescriptions prior to admission  Medication Sig Dispense Refill Last Dose  . albuterol (PROAIR HFA) 108 (90 BASE) MCG/ACT inhaler Inhale 2 puffs into the lungs every 6 (six) hours as needed. For shortness of breath   Past Week at Unknown time  . ALPRAZolam (XANAX) 0.5 MG tablet Take 0.5 mg by mouth daily as needed. anxiety   Past Week at Unknown time  . aspirin 81 MG tablet Take 81 mg by mouth daily.   01/20/2015 at Unknown time  . atorvastatin (LIPITOR) 40 MG tablet Take  1 tablet (40 mg total) by mouth daily. 90 tablet 3 01/19/2015 at Unknown time  . cetirizine (ZYRTEC) 10 MG tablet Take 10 mg by mouth daily.     01/20/2015 at Unknown time  . clonazePAM (KLONOPIN) 0.5 MG tablet Take 0.5 mg by mouth at bedtime.   01/19/2015 at Unknown time  . diltiazem (CARDIZEM CD) 180 MG 24 hr capsule TAKE 1 CAPSULE BY MOUTH DAILY 90 capsule 3 01/20/2015 at Unknown time  . escitalopram (LEXAPRO) 20 MG tablet Take 20 mg by mouth daily.     01/20/2015 at Unknown time  . fluconazole (DIFLUCAN) 100 MG tablet Take 1  tablet (100 mg total) by mouth daily. (Patient taking differently: Take 100 mg by mouth every Monday at 6 PM. ) 9 tablet 0 01/20/2015 at Unknown time  . Fluticasone-Salmeterol (ADVAIR) 250-50 MCG/DOSE AEPB Inhale 1 puff into the lungs 2 (two) times daily.    01/20/2015 at Unknown time  . ibuprofen (ADVIL,MOTRIN) 200 MG tablet Take 400 mg by mouth 2 (two) times daily as needed for mild pain or moderate pain.   01/20/2015 at Unknown time  . Multiple Vitamins-Minerals (MULTIVITAMINS THER. W/MINERALS) TABS Take 1 tablet by mouth daily.     01/20/2015 at Unknown time  . oxybutynin (DITROPAN) 5 MG tablet Take 5 mg by mouth daily.    01/20/2015 at Unknown time  . predniSONE (DELTASONE) 10 MG tablet Take 1 tablet (10 mg total) by mouth daily. Take 40 mg x 3 days then 30 mg x 3 days then 20 mg x 3 days then resume maintenance dose 10 mg daily (Patient taking differently: Take 10 mg by mouth daily. ) 30 tablet 2 01/20/2015 at Unknown time  . roflumilast (DALIRESP) 500 MCG TABS tablet Take 500 mcg by mouth daily.     01/20/2015 at Unknown time  . SPIRIVA RESPIMAT 2.5 MCG/ACT AERS Inhale 1 puff into the lungs daily.    01/20/2015 at Unknown time  . triamcinolone (NASACORT ALLERGY 24HR) 55 MCG/ACT AERO nasal inhaler Place 2 sprays into the nose daily as needed (FOR ALLERGIES).   Past Month at Unknown time  . fluconazole (DIFLUCAN) 150 MG tablet Take 150 mg by mouth once a week.    01/16/2015  . levofloxacin (LEVAQUIN) 750 MG tablet Take 1 tablet (750 mg total) by mouth daily. (Patient not taking: Reported on 01/20/2015) 3 tablet 0    Scheduled: . aspirin EC  81 mg Oral Daily  . atorvastatin  40 mg Oral Daily  . clonazePAM  0.5 mg Oral QHS  . diltiazem  180 mg Oral Daily  . escitalopram  20 mg Oral Daily  . fluconazole  100 mg Oral Daily  . fluticasone  1 spray Each Nare Daily  . guaiFENesin  1,200 mg Oral BID  . heparin  5,000 Units Subcutaneous 3 times per day  . ipratropium-albuterol  3 mL Nebulization TID  .  levofloxacin  750 mg Oral QHS  . methylPREDNISolone (SOLU-MEDROL) injection  60 mg Intravenous Q4H  . montelukast  10 mg Oral QHS  . multivitamin with minerals  1 tablet Oral Daily  . nystatin  5 mL Oral QID  . oxybutynin  5 mg Oral Daily  . roflumilast  500 mcg Oral Daily  . sodium chloride  3 mL Intravenous Q12H   Continuous:  GYF:VCBSWHQPR, ALPRAZolam, sodium chloride  Assesment: She was admitted with COPD exacerbation and acute on chronic respiratory failure. She is improving but slowly Principal Problem:   COPD exacerbation Active  Problems:   Hyperlipidemia   Acute on chronic respiratory failure   Thrush, oral   Chronic respiratory failure with hypoxia   Leukocytosis   Macrocytic anemia    Plan: Continue current treatments    LOS: 5 days   HAWKINS,EDWARD L 01/25/2015, 9:15 AM

## 2015-01-25 NOTE — Progress Notes (Addendum)
[facility] PROGRESS NOTE  [name] EAV:[id number] DOB: Sep 03, 1955 DOA: 01/20/2015 PCP: Cassell Smiles., MD  Assessment/Plan: 1. COPD exacerbation,improving. Will continue current treatment with nebs, steroids, and abx.  CXR consistent with emphysema. On roflumilast, Spiriva, Advair, albuterol as an outpatient. Wheezing is improving. Will ambulate and monitor oxygen needs while moving.  2. Chronic hypoxic respiratory failure, on 4L [place] and BiPAP QHS at home. Oxygen requirement stable. Pulmonology has evaluated and agrees to continue current treatment .  3. Leukocytosis, possibly chronic. Unchanged. Remains afebrile. UC and GAS throat cultures unremarkable. 4. Oral candidiasis likely secondary to antibiotic and steroids, improving with Diflucan and Nystatin..  5. Macrocytic anemia, stable.  6. Maxillary sinus pain, postnasal drainage. Improving with steroid and Mucinex.  7. PMH SVT, stable.  Code Status: Full DVT prophylaxis: Heparin  Family Communication: Discussed with patient who understands and has no concerns at this time. Disposition Plan: Discharge home within 1 day.    Consultants:  Pulmonology    Procedures:    Antibiotics:  Levaquin 9/17>>  HPI/Subjective: Feeling better, breathing is improvement but is not back to baseline.  Objective: Filed Vitals:   01/25/15 0512  BP: 135/61  Pulse: 64  Temp: 98 F (36.7 C)  Resp: 20    Intake/Output Summary (Last 24 hours) at 01/25/15 1000 Last data filed at 01/25/15 0943  Gross per 24 hour  Intake    720 ml  Output   1600 ml  Net   -880 ml   Filed Weights   01/20/15 1816 01/21/15 0010  Weight: 48.535 kg (107 lb) 49.85 kg (109 lb 14.4 oz)    Exam: General: NAD, looks comfortable. Sitting up in bed. Able to speak in full sentences.  Cardiovascular: RRR, S1, S2  Respiratory: Coarse breath sounds at the bases. No wheezing, rales or rhnochi Abdomen: soft, non tender, no distention , bowel sounds  normal Musculoskeletal: No edema b/l  Data Reviewed: Basic Metabolic Panel:  Recent Labs Lab 01/21/15 0448  NA 139  K 4.7  CL 100*  CO2 34*  GLUCOSE 134*  BUN 9  CREATININE 0.63  CALCIUM 8.1*   Liver Function Tests: No results for input(s): AST, ALT, ALKPHOS, BILITOT, PROT, ALBUMIN in the last 168 hours. No results for input(s): LIPASE, AMYLASE in the last 168 hours. No results for input(s): AMMONIA in the last 168 hours. CBC:  Recent Labs Lab 01/20/15 1906 01/21/15 0448 01/22/15 0551 01/23/15 0634  WBC 29.1* 24.2* 15.5* 15.8*  NEUTROABS 27.1*  --   --   --   HGB 12.3 10.8* 10.5* 11.1*  HCT 37.9 33.7*  33.2* 33.1* 35.2*  MCV 100.5* 102.1* 100.3* 100.3*  PLT 253 245 311 391   Cardiac Enzymes: No results for input(s): CKTOTAL, CKMB, CKMBINDEX, TROPONINI in the last 168 hours. BNP (last 3 results)  Recent Labs  11/14/14 2030 01/20/15 1906  BNP 104.0* 86.0    ProBNP (last 3 results) No results for input(s): PROBNP in the last 8760 hours.  CBG: No results for input(s): GLUCAP in the last 168 hours.  Recent Results (from the past 240 hour(s))  Rapid strep screen     Status: None   Collection Time: 01/20/15  7:06 PM  Result Value Ref Range Status   Streptococcus, Group A Screen (Direct) NEGATIVE NEGATIVE Final    Comment: (NOTE) A Rapid Antigen test may result negative if the antigen level in the sample is below the detection level of this test. The FDA has not cleared this test  as a stand-alone test therefore the rapid antigen negative result has reflexed to a Group A Strep culture.   Culture, Group A Strep     Status: None   Collection Time: 01/20/15  7:06 PM  Result Value Ref Range Status   Strep A Culture Negative  Final    Comment: (NOTE) Performed At: [facility] [address] [place], [place] [id number] Mila Homer MD [id number]   Urine culture     Status: None   Collection Time: 01/20/15  7:57 PM  Result Value Ref Range  Status   Specimen Description URINE, CLEAN CATCH  Final   Special Requests NONE  Final   Culture   Final    MULTIPLE SPECIES PRESENT, SUGGEST RECOLLECTION Performed at [facility]    Report Status 01/23/2015 FINAL  Final     Studies: Dg Chest 2 View  01/24/2015   CLINICAL DATA:  Shortness of breath, congestion, cough  EXAM: CHEST  2 VIEW  COMPARISON:  01/20/2015  FINDINGS: Cardiomediastinal silhouette is stable. Hyperinflation and chronic interstitial prominence again noted. Calcified granuloma in right midlung laterally again noted. Stable scarring in left base. No acute infiltrate or pulmonary edema. Thoracic spine osteopenia.  IMPRESSION: Stable hyperinflation and chronic interstitial prominence. No definite superimposed infiltrate or pulmonary edema.   Electronically Signed   By: Natasha Mead M.D.   On: 01/24/2015 14:26    Scheduled Meds: . aspirin EC  81 mg Oral Daily  . atorvastatin  40 mg Oral Daily  . clonazePAM  0.5 mg Oral QHS  . diltiazem  180 mg Oral Daily  . escitalopram  20 mg Oral Daily  . fluconazole  100 mg Oral Daily  . fluticasone  1 spray Each Nare Daily  . guaiFENesin  1,200 mg Oral BID  . heparin  5,000 Units Subcutaneous 3 times per day  . ipratropium-albuterol  3 mL Nebulization TID  . levofloxacin  750 mg Oral QHS  . methylPREDNISolone (SOLU-MEDROL) injection  60 mg Intravenous Q4H  . montelukast  10 mg Oral QHS  . multivitamin with minerals  1 tablet Oral Daily  . nystatin  5 mL Oral QID  . oxybutynin  5 mg Oral Daily  . roflumilast  500 mcg Oral Daily  . sodium chloride  3 mL Intravenous Q12H   Continuous Infusions:   Principal Problem:   COPD exacerbation Active Problems:   Hyperlipidemia   Thrush, oral   Chronic respiratory failure with hypoxia   Leukocytosis   Macrocytic anemia    Time spent: 25 minutes     Erick Blinks, MD  [facility] Pager [phone]. If 7PM-7AM, please contact night-coverage at [email or web address],  password [facility] 01/25/2015, 10:00 AM  LOS: 5 days      By signing my name below, I, Zadie Cleverly, attest that this documentation has been prepared under the direction and in the presence of Erick Blinks, MD. Electronically signed: Zadie Cleverly, Scribe. 01/25/2015  I, Dr. Erick Blinks, personally performed the services described in this documentaiton. All medical record entries made by the scribe were at my direction and in my presence. I have reviewed the chart and agree that the record reflects my personal performance and is accurate and complete  Erick Blinks, MD, 01/25/2015 6:10 PM

## 2015-01-26 DIAGNOSIS — J9621 Acute and chronic respiratory failure with hypoxia: Secondary | ICD-10-CM

## 2015-01-26 DIAGNOSIS — D72829 Elevated white blood cell count, unspecified: Secondary | ICD-10-CM

## 2015-01-26 MED ORDER — GUAIFENESIN ER 600 MG PO TB12: 600.0000 mg | ORAL_TABLET | Freq: Two times a day (BID) | ORAL | Status: DC

## 2015-01-26 MED ORDER — PREDNISONE 10 MG PO TABS: ORAL_TABLET | ORAL | Status: DC

## 2015-01-26 MED ORDER — DILTIAZEM HCL ER COATED BEADS 180 MG PO CP24: 180.0000 mg | ORAL_CAPSULE | Freq: Every day | ORAL | Status: DC

## 2015-01-26 MED ORDER — MONTELUKAST SODIUM 10 MG PO TABS: 10.0000 mg | ORAL_TABLET | Freq: Every day | ORAL | Status: DC

## 2015-01-26 MED ORDER — IPRATROPIUM-ALBUTEROL 0.5-2.5 (3) MG/3ML IN SOLN: 3.0000 mL | Freq: Three times a day (TID) | RESPIRATORY_TRACT | Status: DC

## 2015-01-26 MED ORDER — MONTELUKAST SODIUM 10 MG PO TABS: 10.0000 mg | ORAL_TABLET | Freq: Every day | ORAL | Status: AC

## 2015-01-26 NOTE — Care Management Important Message (Signed)
Important Message  Patient Details  Name: [name] MRN: [id number] Date of Birth: 1956/01/24   Medicare Important Message Given:  Yes-second notification given    Sherald Barge, RN 01/26/2015, 11:46 AM

## 2015-01-26 NOTE — Progress Notes (Signed)
Patient with orders to be discharge home. Discharge instructions given, patient verbalized understanding. Prescriptions given. Patient stable. Patient left in private vehicle with family.  

## 2015-01-26 NOTE — Progress Notes (Signed)
She is being discharged so I will of course Sign off at this time

## 2015-01-26 NOTE — Care Management Note (Signed)
Case Management Note  Patient Details  Name: [name] MRN: [id number] Date of Birth: [birth year]  Expected Discharge Date:                  Expected Discharge Plan:  [place]  In-House Referral:  NA  Discharge planning Services  CM Consult  Post Acute Care Choice:  Resumption of Svcs/PTA Provider Choice offered to:  Patient  DME Arranged:    DME Agency:     HH Arranged:  RN, Nurse's Aide [place] Agency:  [place]  Status of Service:  Completed, signed off  Medicare Important Message Given:  Yes-second notification given Date Medicare IM Given:    Medicare IM give by:    Date Additional Medicare IM Given:    Additional Medicare Important Message give by:     If discussed at [place] of Stay Meetings, dates discussed:    Additional Comments: Pt discharging home today with resumptions of [place] services (RN/aid). Romualdo Bolk, of [facility], made aware of discharge today and will obtain pt info from chart. Pt aware [facility] has 48 hours to make first visit. No further CM needs. At this time.  Sherald Barge, RN 01/26/2015, 1:49 PM

## 2015-01-26 NOTE — Discharge Summary (Addendum)
Physician Discharge Summary  [name] AOZ:[id number] DOB: 1956/01/08 DOA: 01/20/2015  PCP: Cassell Smiles., MD  Admit date: 01/20/2015 Discharge date: 01/26/2015  Time spent: 35 minutes  Recommendations for Outpatient Follow-up:  1. Follow up with Pulmonologist, Dr.Kimberly Bird as scheduled. 2. Continue on roflumilast, Spiriva, Advair, albuterol as an outpatient.  3. Continue on 4L [place] and BiPAP QHS at home. 4. Repeat cbc in 1 week to follow leukocytosis   Discharge Diagnoses:  Principal Problem:   COPD exacerbation Active Problems:   Hyperlipidemia   Thrush, oral   Chronic respiratory failure with hypoxia   Leukocytosis   Macrocytic anemia   Discharge Condition: Improved  Diet recommendation: Heart healthy  Filed Weights   01/20/15 1816 01/21/15 0010  Weight: 48.535 kg (107 lb) 49.85 kg (109 lb 14.4 oz)    History of present illness:  59 yo female with history of COPD, on 4L [place] home O2, CAD, SVT, HLD presented with increased SOB requiring O2, fever and general fatigue. While in the ED patient was found to have leukocytosis with WBC of 30 and MCV of 100. Hospitalist was asked to admit her for further evaluation and treatment.  Hospital Course:  59 y.o. female with O2/BiPAP/steroid dependent COPD was admitted for COPD exacerbation and chronic hypoxic respiratory failure with hypoxia. She improved with nebs, steroids, and abx treatments and remained on 4L [place].Her CXR was consistent with emphysema. Wheezing improved. Patient was ambulated and her O2 and was monitored oxygen while moving.Pulmonology evaluated and agreed with her care. She will be transitioned to Prednisone taper and will continue a coarse of abx and nebulizer treatments at home. She feels that she is approaching her baseline and feels that she is ready to be discharged home. 1. Leukocytosis, possibly chronic. Unchanged. Remained afebrile. UC and GAS throat cultures were unremarkable. Recommend repeat CBC in  one week and further workup if Leukocytosis is persistent. 2. Oral candidiasis likely secondary to antibiotic and steroids, improved with Diflucan and Nystatin..  3. Macrocytic anemia, stable.  4. Maxillary sinus pain, postnasal drainage. Improved with steroid and Mucinex.  5. PMH SVT, stable.   Consultants:  Pulmonology  Procedures:    Antibiotics:  Levaquin 9/17>>  Discharge Exam: Filed Vitals:   01/26/15 0550  BP: 117/62  Pulse: 67  Temp: 97.9 F (36.6 C)  Resp: 20    General: NAD, looks comfortable. Sitting up in bed. Able to speak in full sentences.   Cardiovascular: regular rate and rhythm, S1, S2   Respiratory: Course breath sounds at the bases. No r/r  Abdomen: soft, ntnd, normal bowel sounds   Musculoskeletal: No bilateral LE edema  Discharge Instructions    Current Discharge Medication List    CONTINUE these medications which have NOT CHANGED   Details  albuterol (PROAIR HFA) 108 (90 BASE) MCG/ACT inhaler Inhale 2 puffs into the lungs every 6 (six) hours as needed. For shortness of breath    ALPRAZolam (XANAX) 0.5 MG tablet Take 0.5 mg by mouth daily as needed. anxiety    aspirin 81 MG tablet Take 81 mg by mouth daily.    atorvastatin (LIPITOR) 40 MG tablet Take 1 tablet (40 mg total) by mouth daily. Qty: 90 tablet, Refills: 3    cetirizine (ZYRTEC) 10 MG tablet Take 10 mg by mouth daily.      clonazePAM (KLONOPIN) 0.5 MG tablet Take 0.5 mg by mouth at bedtime.    diltiazem (CARDIZEM CD) 180 MG 24 hr capsule TAKE 1 CAPSULE BY MOUTH DAILY  Qty: 90 capsule, Refills: 3    escitalopram (LEXAPRO) 20 MG tablet Take 20 mg by mouth daily.      !! fluconazole (DIFLUCAN) 100 MG tablet Take 1 tablet (100 mg total) by mouth daily. Qty: 9 tablet, Refills: 0    Fluticasone-Salmeterol (ADVAIR) 250-50 MCG/DOSE AEPB Inhale 1 puff into the lungs 2 (two) times daily.     ibuprofen (ADVIL,MOTRIN) 200 MG tablet Take 400 mg by mouth 2 (two) times daily  as needed for mild pain or moderate pain.    Multiple Vitamins-Minerals (MULTIVITAMINS THER. W/MINERALS) TABS Take 1 tablet by mouth daily.      oxybutynin (DITROPAN) 5 MG tablet Take 5 mg by mouth daily.     predniSONE (DELTASONE) 10 MG tablet Take 1 tablet (10 mg total) by mouth daily. Take 40 mg x 3 days then 30 mg x 3 days then 20 mg x 3 days then resume maintenance dose 10 mg daily Qty: 30 tablet, Refills: 2    roflumilast (DALIRESP) 500 MCG TABS tablet Take 500 mcg by mouth daily.      SPIRIVA RESPIMAT 2.5 MCG/ACT AERS Inhale 1 puff into the lungs daily.     triamcinolone (NASACORT ALLERGY 24HR) 55 MCG/ACT AERO nasal inhaler Place 2 sprays into the nose daily as needed (FOR ALLERGIES).    !! fluconazole (DIFLUCAN) 150 MG tablet Take 150 mg by mouth once a week.     levofloxacin (LEVAQUIN) 750 MG tablet Take 1 tablet (750 mg total) by mouth daily. Qty: 3 tablet, Refills: 0     !! - Potential duplicate medications found. Please discuss with provider.     Allergies  Allergen Reactions  . Iohexol Anaphylaxis     Desc: IV DYE  ANAPHYLATIC SHOCK X2 ONCE AFTER PREMEDS   . Chantix [Varenicline] Other (See Comments)    PALPITATIONS  . Ketek [Telithromycin]   . Penicillins Other (See Comments)    unknown      The results of significant diagnostics from this hospitalization (including imaging, microbiology, ancillary and laboratory) are listed below for reference.    Significant Diagnostic Studies: Dg Chest 2 View  01/24/2015   CLINICAL DATA:  Shortness of breath, congestion, cough  EXAM: CHEST  2 VIEW  COMPARISON:  01/20/2015  FINDINGS: Cardiomediastinal silhouette is stable. Hyperinflation and chronic interstitial prominence again noted. Calcified granuloma in right midlung laterally again noted. Stable scarring in left base. No acute infiltrate or pulmonary edema. Thoracic spine osteopenia.  IMPRESSION: Stable hyperinflation and chronic interstitial prominence. No definite  superimposed infiltrate or pulmonary edema.   Electronically Signed   By: Natasha Mead M.D.   On: 01/24/2015 14:26   Dg Chest 2 View  01/20/2015   CLINICAL DATA:  59 year old female with shortness of breath and congestion and fever.  EXAM: CHEST  2 VIEW  COMPARISON:  Radiograph dated 11/13/2013 and the dated 06/12/2006  FINDINGS: Two views of the chest demonstrate emphysematous changes of the lungs. There is mild blunting of the costophrenic angles which may represent a small pleural effusion. Scattered bilateral nodular density measuring up to 6 mm in the right mid lung field correspond to the calcified granuloma seen on the prior CT. Linear atelectasis/ scarring noted at the lower lung fields. There is no focal consolidation or pneumothorax. Stable cardiac silhouette. Degenerative changes of the spine. Cervical fixation plate and screws with no acute fracture.  IMPRESSION: Emphysema.  No focal consolidation or pneumothorax.  Slight blunting of the costophrenic angles may represent a small pleural  effusion.   Electronically Signed   By: Elgie Collard M.D.   On: 01/20/2015 20:50    Microbiology: Recent Results (from the past 240 hour(s))  Rapid strep screen     Status: None   Collection Time: 01/20/15  7:06 PM  Result Value Ref Range Status   Streptococcus, Group A Screen (Direct) NEGATIVE NEGATIVE Final    Comment: (NOTE) A Rapid Antigen test may result negative if the antigen level in the sample is below the detection level of this test. The FDA has not cleared this test as a stand-alone test therefore the rapid antigen negative result has reflexed to a Group A Strep culture.   Culture, Group A Strep     Status: None   Collection Time: 01/20/15  7:06 PM  Result Value Ref Range Status   Strep A Culture Negative  Final    Comment: (NOTE) Performed At: [facility] [address] [place], [place] [id number] Mila Homer MD [id number]   Urine culture     Status: None    Collection Time: 01/20/15  7:57 PM  Result Value Ref Range Status   Specimen Description URINE, CLEAN CATCH  Final   Special Requests NONE  Final   Culture   Final    MULTIPLE SPECIES PRESENT, SUGGEST RECOLLECTION Performed at [facility]    Report Status 01/23/2015 FINAL  Final     Labs: Basic Metabolic Panel:  Recent Labs Lab 01/21/15 0448  NA 139  K 4.7  CL 100*  CO2 34*  GLUCOSE 134*  BUN 9  CREATININE 0.63  CALCIUM 8.1*   Liver Function Tests: CBC:  Recent Labs Lab 01/20/15 1906 01/21/15 0448 01/22/15 0551 01/23/15 0634  WBC 29.1* 24.2* 15.5* 15.8*  NEUTROABS 27.1*  --   --   --   HGB 12.3 10.8* 10.5* 11.1*  HCT 37.9 33.7*  33.2* 33.1* 35.2*  MCV 100.5* 102.1* 100.3* 100.3*  PLT 253 245 311 391   Cardiac Enzymes: BNP: BNP (last 3 results)  Recent Labs  11/14/14 2030 01/20/15 1906  BNP 104.0* 86.0    ProBNP (last 3 results)  CBG:   By signing my name below, I, Edman Circle attest that this documentation has been prepared under the direction and in the presence of Erick Blinks, M.D.  Electronically signed: Edman Circle  01/26/2015    Signed:  Erick Blinks, M.D. [facility] 01/26/2015, 6:56 AM    I, Dr. Erick Blinks, personally performed the services described in this documentaiton. All medical record entries made by the scribe were at my direction and in my presence. I have reviewed the chart and agree that the record reflects my personal performance and is accurate and complete  Erick Blinks, MD, 01/26/2015 11:08 AM

## 2015-01-28 DIAGNOSIS — D649 Anemia, unspecified: Secondary | ICD-10-CM | POA: Diagnosis not present

## 2015-01-28 DIAGNOSIS — J9622 Acute and chronic respiratory failure with hypercapnia: Secondary | ICD-10-CM | POA: Diagnosis not present

## 2015-01-28 DIAGNOSIS — Z8701 Personal history of pneumonia (recurrent): Secondary | ICD-10-CM | POA: Diagnosis not present

## 2015-01-28 DIAGNOSIS — Z9981 Dependence on supplemental oxygen: Secondary | ICD-10-CM | POA: Diagnosis not present

## 2015-01-28 DIAGNOSIS — Z87891 Personal history of nicotine dependence: Secondary | ICD-10-CM | POA: Diagnosis not present

## 2015-01-28 DIAGNOSIS — J441 Chronic obstructive pulmonary disease with (acute) exacerbation: Secondary | ICD-10-CM | POA: Diagnosis not present

## 2015-02-01 ENCOUNTER — Other Ambulatory Visit (HOSPITAL_COMMUNITY)
Admission: RE | Admit: 2015-02-01 | Discharge: 2015-02-01 | Disposition: A | Discharge status: Home / Self Care | Payer: Medicare Other | Source: Other Acute Inpatient Hospital | Attending: Pediatric Pulmonology | Admitting: Pediatric Pulmonology

## 2015-02-01 DIAGNOSIS — Z87891 Personal history of nicotine dependence: Secondary | ICD-10-CM | POA: Diagnosis not present

## 2015-02-01 DIAGNOSIS — J9622 Acute and chronic respiratory failure with hypercapnia: Secondary | ICD-10-CM | POA: Diagnosis not present

## 2015-02-01 DIAGNOSIS — Z9981 Dependence on supplemental oxygen: Secondary | ICD-10-CM | POA: Diagnosis not present

## 2015-02-01 DIAGNOSIS — Z8701 Personal history of pneumonia (recurrent): Secondary | ICD-10-CM | POA: Diagnosis not present

## 2015-02-01 DIAGNOSIS — J441 Chronic obstructive pulmonary disease with (acute) exacerbation: Secondary | ICD-10-CM | POA: Diagnosis not present

## 2015-02-01 DIAGNOSIS — D649 Anemia, unspecified: Secondary | ICD-10-CM | POA: Diagnosis not present

## 2015-02-01 LAB — CBC WITH DIFFERENTIAL/PLATELET
Basophils Absolute: 0 10*3/uL (ref 0.0–0.1)
Basophils Relative: 0 %
EOS ABS: 0.2 10*3/uL (ref 0.0–0.7)
EOS PCT: 1 %
HCT: 35.2 % — ABNORMAL LOW (ref 36.0–46.0)
Hemoglobin: 11.1 g/dL — ABNORMAL LOW (ref 12.0–15.0)
LYMPHS ABS: 2.5 10*3/uL (ref 0.7–4.0)
Lymphocytes Relative: 10 %
MCH: 31.5 pg (ref 26.0–34.0)
MCHC: 31.5 g/dL (ref 30.0–36.0)
MCV: 100 fL (ref 78.0–100.0)
MONO ABS: 1.5 10*3/uL — AB (ref 0.1–1.0)
MONOS PCT: 6 %
Neutro Abs: 22.1 10*3/uL — ABNORMAL HIGH (ref 1.7–7.7)
Neutrophils Relative %: 84 %
PLATELETS: 408 10*3/uL — AB (ref 150–400)
RBC: 3.52 MIL/uL — AB (ref 3.87–5.11)
RDW: 14.7 % (ref 11.5–15.5)
WBC: 26.3 10*3/uL — AB (ref 4.0–10.5)

## 2015-02-03 DIAGNOSIS — D649 Anemia, unspecified: Secondary | ICD-10-CM | POA: Diagnosis not present

## 2015-02-03 DIAGNOSIS — J441 Chronic obstructive pulmonary disease with (acute) exacerbation: Secondary | ICD-10-CM | POA: Diagnosis not present

## 2015-02-03 DIAGNOSIS — Z9981 Dependence on supplemental oxygen: Secondary | ICD-10-CM | POA: Diagnosis not present

## 2015-02-03 DIAGNOSIS — J9622 Acute and chronic respiratory failure with hypercapnia: Secondary | ICD-10-CM | POA: Diagnosis not present

## 2015-02-03 DIAGNOSIS — Z8701 Personal history of pneumonia (recurrent): Secondary | ICD-10-CM | POA: Diagnosis not present

## 2015-02-03 DIAGNOSIS — Z87891 Personal history of nicotine dependence: Secondary | ICD-10-CM | POA: Diagnosis not present

## 2015-02-06 DIAGNOSIS — Z8701 Personal history of pneumonia (recurrent): Secondary | ICD-10-CM | POA: Diagnosis not present

## 2015-02-06 DIAGNOSIS — J9622 Acute and chronic respiratory failure with hypercapnia: Secondary | ICD-10-CM | POA: Diagnosis not present

## 2015-02-06 DIAGNOSIS — Z87891 Personal history of nicotine dependence: Secondary | ICD-10-CM | POA: Diagnosis not present

## 2015-02-06 DIAGNOSIS — J441 Chronic obstructive pulmonary disease with (acute) exacerbation: Secondary | ICD-10-CM | POA: Diagnosis not present

## 2015-02-06 DIAGNOSIS — D649 Anemia, unspecified: Secondary | ICD-10-CM | POA: Diagnosis not present

## 2015-02-06 DIAGNOSIS — Z9981 Dependence on supplemental oxygen: Secondary | ICD-10-CM | POA: Diagnosis not present

## 2015-02-08 DIAGNOSIS — J449 Chronic obstructive pulmonary disease, unspecified: Secondary | ICD-10-CM | POA: Diagnosis not present

## 2015-02-08 DIAGNOSIS — J309 Allergic rhinitis, unspecified: Secondary | ICD-10-CM | POA: Diagnosis not present

## 2015-02-08 DIAGNOSIS — G4752 REM sleep behavior disorder: Secondary | ICD-10-CM | POA: Diagnosis not present

## 2015-02-08 DIAGNOSIS — J9611 Chronic respiratory failure with hypoxia: Secondary | ICD-10-CM | POA: Diagnosis not present

## 2015-02-09 DIAGNOSIS — J441 Chronic obstructive pulmonary disease with (acute) exacerbation: Secondary | ICD-10-CM | POA: Diagnosis not present

## 2015-02-09 DIAGNOSIS — Z9981 Dependence on supplemental oxygen: Secondary | ICD-10-CM | POA: Diagnosis not present

## 2015-02-09 DIAGNOSIS — J9622 Acute and chronic respiratory failure with hypercapnia: Secondary | ICD-10-CM | POA: Diagnosis not present

## 2015-02-09 DIAGNOSIS — D649 Anemia, unspecified: Secondary | ICD-10-CM | POA: Diagnosis not present

## 2015-02-09 DIAGNOSIS — Z8701 Personal history of pneumonia (recurrent): Secondary | ICD-10-CM | POA: Diagnosis not present

## 2015-02-09 DIAGNOSIS — Z87891 Personal history of nicotine dependence: Secondary | ICD-10-CM | POA: Diagnosis not present

## 2015-02-10 DIAGNOSIS — Z9981 Dependence on supplemental oxygen: Secondary | ICD-10-CM | POA: Diagnosis not present

## 2015-02-10 DIAGNOSIS — J9622 Acute and chronic respiratory failure with hypercapnia: Secondary | ICD-10-CM | POA: Diagnosis not present

## 2015-02-10 DIAGNOSIS — Z8701 Personal history of pneumonia (recurrent): Secondary | ICD-10-CM | POA: Diagnosis not present

## 2015-02-10 DIAGNOSIS — J441 Chronic obstructive pulmonary disease with (acute) exacerbation: Secondary | ICD-10-CM | POA: Diagnosis not present

## 2015-02-10 DIAGNOSIS — Z87891 Personal history of nicotine dependence: Secondary | ICD-10-CM | POA: Diagnosis not present

## 2015-02-10 DIAGNOSIS — D649 Anemia, unspecified: Secondary | ICD-10-CM | POA: Diagnosis not present

## 2015-02-13 DIAGNOSIS — J9622 Acute and chronic respiratory failure with hypercapnia: Secondary | ICD-10-CM | POA: Diagnosis not present

## 2015-02-13 DIAGNOSIS — Z9981 Dependence on supplemental oxygen: Secondary | ICD-10-CM | POA: Diagnosis not present

## 2015-02-13 DIAGNOSIS — Z8701 Personal history of pneumonia (recurrent): Secondary | ICD-10-CM | POA: Diagnosis not present

## 2015-02-13 DIAGNOSIS — Z87891 Personal history of nicotine dependence: Secondary | ICD-10-CM | POA: Diagnosis not present

## 2015-02-13 DIAGNOSIS — D649 Anemia, unspecified: Secondary | ICD-10-CM | POA: Diagnosis not present

## 2015-02-13 DIAGNOSIS — J441 Chronic obstructive pulmonary disease with (acute) exacerbation: Secondary | ICD-10-CM | POA: Diagnosis not present

## 2015-02-15 DIAGNOSIS — D649 Anemia, unspecified: Secondary | ICD-10-CM | POA: Diagnosis not present

## 2015-02-15 DIAGNOSIS — J441 Chronic obstructive pulmonary disease with (acute) exacerbation: Secondary | ICD-10-CM | POA: Diagnosis not present

## 2015-02-15 DIAGNOSIS — J9622 Acute and chronic respiratory failure with hypercapnia: Secondary | ICD-10-CM | POA: Diagnosis not present

## 2015-02-15 DIAGNOSIS — Z8701 Personal history of pneumonia (recurrent): Secondary | ICD-10-CM | POA: Diagnosis not present

## 2015-02-15 DIAGNOSIS — Z9981 Dependence on supplemental oxygen: Secondary | ICD-10-CM | POA: Diagnosis not present

## 2015-02-15 DIAGNOSIS — Z87891 Personal history of nicotine dependence: Secondary | ICD-10-CM | POA: Diagnosis not present

## 2015-02-17 DIAGNOSIS — Z87891 Personal history of nicotine dependence: Secondary | ICD-10-CM | POA: Diagnosis not present

## 2015-02-17 DIAGNOSIS — Z9981 Dependence on supplemental oxygen: Secondary | ICD-10-CM | POA: Diagnosis not present

## 2015-02-17 DIAGNOSIS — J441 Chronic obstructive pulmonary disease with (acute) exacerbation: Secondary | ICD-10-CM | POA: Diagnosis not present

## 2015-02-17 DIAGNOSIS — Z8701 Personal history of pneumonia (recurrent): Secondary | ICD-10-CM | POA: Diagnosis not present

## 2015-02-17 DIAGNOSIS — D649 Anemia, unspecified: Secondary | ICD-10-CM | POA: Diagnosis not present

## 2015-02-17 DIAGNOSIS — J9622 Acute and chronic respiratory failure with hypercapnia: Secondary | ICD-10-CM | POA: Diagnosis not present

## 2015-02-20 DIAGNOSIS — D649 Anemia, unspecified: Secondary | ICD-10-CM | POA: Diagnosis not present

## 2015-02-20 DIAGNOSIS — J441 Chronic obstructive pulmonary disease with (acute) exacerbation: Secondary | ICD-10-CM | POA: Diagnosis not present

## 2015-02-20 DIAGNOSIS — Z9981 Dependence on supplemental oxygen: Secondary | ICD-10-CM | POA: Diagnosis not present

## 2015-02-20 DIAGNOSIS — Z87891 Personal history of nicotine dependence: Secondary | ICD-10-CM | POA: Diagnosis not present

## 2015-02-20 DIAGNOSIS — Z8701 Personal history of pneumonia (recurrent): Secondary | ICD-10-CM | POA: Diagnosis not present

## 2015-02-20 DIAGNOSIS — J9622 Acute and chronic respiratory failure with hypercapnia: Secondary | ICD-10-CM | POA: Diagnosis not present

## 2015-02-22 DIAGNOSIS — J441 Chronic obstructive pulmonary disease with (acute) exacerbation: Secondary | ICD-10-CM | POA: Diagnosis not present

## 2015-02-22 DIAGNOSIS — Z9981 Dependence on supplemental oxygen: Secondary | ICD-10-CM | POA: Diagnosis not present

## 2015-02-22 DIAGNOSIS — J9622 Acute and chronic respiratory failure with hypercapnia: Secondary | ICD-10-CM | POA: Diagnosis not present

## 2015-02-22 DIAGNOSIS — D649 Anemia, unspecified: Secondary | ICD-10-CM | POA: Diagnosis not present

## 2015-02-22 DIAGNOSIS — Z8701 Personal history of pneumonia (recurrent): Secondary | ICD-10-CM | POA: Diagnosis not present

## 2015-02-22 DIAGNOSIS — Z87891 Personal history of nicotine dependence: Secondary | ICD-10-CM | POA: Diagnosis not present

## 2015-02-24 DIAGNOSIS — J9622 Acute and chronic respiratory failure with hypercapnia: Secondary | ICD-10-CM | POA: Diagnosis not present

## 2015-02-24 DIAGNOSIS — J441 Chronic obstructive pulmonary disease with (acute) exacerbation: Secondary | ICD-10-CM | POA: Diagnosis not present

## 2015-02-24 DIAGNOSIS — Z9981 Dependence on supplemental oxygen: Secondary | ICD-10-CM | POA: Diagnosis not present

## 2015-02-24 DIAGNOSIS — D649 Anemia, unspecified: Secondary | ICD-10-CM | POA: Diagnosis not present

## 2015-02-24 DIAGNOSIS — Z87891 Personal history of nicotine dependence: Secondary | ICD-10-CM | POA: Diagnosis not present

## 2015-02-24 DIAGNOSIS — Z8701 Personal history of pneumonia (recurrent): Secondary | ICD-10-CM | POA: Diagnosis not present

## 2015-02-27 DIAGNOSIS — Z8701 Personal history of pneumonia (recurrent): Secondary | ICD-10-CM | POA: Diagnosis not present

## 2015-02-27 DIAGNOSIS — Z9981 Dependence on supplemental oxygen: Secondary | ICD-10-CM | POA: Diagnosis not present

## 2015-02-27 DIAGNOSIS — J441 Chronic obstructive pulmonary disease with (acute) exacerbation: Secondary | ICD-10-CM | POA: Diagnosis not present

## 2015-02-27 DIAGNOSIS — J9622 Acute and chronic respiratory failure with hypercapnia: Secondary | ICD-10-CM | POA: Diagnosis not present

## 2015-02-27 DIAGNOSIS — D649 Anemia, unspecified: Secondary | ICD-10-CM | POA: Diagnosis not present

## 2015-02-27 DIAGNOSIS — Z87891 Personal history of nicotine dependence: Secondary | ICD-10-CM | POA: Diagnosis not present

## 2015-03-01 DIAGNOSIS — J441 Chronic obstructive pulmonary disease with (acute) exacerbation: Secondary | ICD-10-CM | POA: Diagnosis not present

## 2015-03-01 DIAGNOSIS — J9611 Chronic respiratory failure with hypoxia: Secondary | ICD-10-CM | POA: Diagnosis not present

## 2015-03-01 DIAGNOSIS — G4752 REM sleep behavior disorder: Secondary | ICD-10-CM | POA: Diagnosis not present

## 2015-03-01 DIAGNOSIS — G4733 Obstructive sleep apnea (adult) (pediatric): Secondary | ICD-10-CM | POA: Diagnosis not present

## 2015-03-01 DIAGNOSIS — B370 Candidal stomatitis: Secondary | ICD-10-CM | POA: Diagnosis not present

## 2015-03-03 DIAGNOSIS — Z9981 Dependence on supplemental oxygen: Secondary | ICD-10-CM | POA: Diagnosis not present

## 2015-03-03 DIAGNOSIS — Z87891 Personal history of nicotine dependence: Secondary | ICD-10-CM | POA: Diagnosis not present

## 2015-03-03 DIAGNOSIS — Z8701 Personal history of pneumonia (recurrent): Secondary | ICD-10-CM | POA: Diagnosis not present

## 2015-03-03 DIAGNOSIS — J441 Chronic obstructive pulmonary disease with (acute) exacerbation: Secondary | ICD-10-CM | POA: Diagnosis not present

## 2015-03-03 DIAGNOSIS — J9622 Acute and chronic respiratory failure with hypercapnia: Secondary | ICD-10-CM | POA: Diagnosis not present

## 2015-03-03 DIAGNOSIS — D649 Anemia, unspecified: Secondary | ICD-10-CM | POA: Diagnosis not present

## 2015-03-06 DIAGNOSIS — J441 Chronic obstructive pulmonary disease with (acute) exacerbation: Secondary | ICD-10-CM | POA: Diagnosis not present

## 2015-03-06 DIAGNOSIS — J9622 Acute and chronic respiratory failure with hypercapnia: Secondary | ICD-10-CM | POA: Diagnosis not present

## 2015-03-06 DIAGNOSIS — Z8701 Personal history of pneumonia (recurrent): Secondary | ICD-10-CM | POA: Diagnosis not present

## 2015-03-06 DIAGNOSIS — Z9981 Dependence on supplemental oxygen: Secondary | ICD-10-CM | POA: Diagnosis not present

## 2015-03-06 DIAGNOSIS — D649 Anemia, unspecified: Secondary | ICD-10-CM | POA: Diagnosis not present

## 2015-03-06 DIAGNOSIS — Z87891 Personal history of nicotine dependence: Secondary | ICD-10-CM | POA: Diagnosis not present

## 2015-03-08 DIAGNOSIS — D649 Anemia, unspecified: Secondary | ICD-10-CM | POA: Diagnosis not present

## 2015-03-08 DIAGNOSIS — Z8701 Personal history of pneumonia (recurrent): Secondary | ICD-10-CM | POA: Diagnosis not present

## 2015-03-08 DIAGNOSIS — J9622 Acute and chronic respiratory failure with hypercapnia: Secondary | ICD-10-CM | POA: Diagnosis not present

## 2015-03-08 DIAGNOSIS — J441 Chronic obstructive pulmonary disease with (acute) exacerbation: Secondary | ICD-10-CM | POA: Diagnosis not present

## 2015-03-08 DIAGNOSIS — Z87891 Personal history of nicotine dependence: Secondary | ICD-10-CM | POA: Diagnosis not present

## 2015-03-08 DIAGNOSIS — Z9981 Dependence on supplemental oxygen: Secondary | ICD-10-CM | POA: Diagnosis not present

## 2015-03-09 DIAGNOSIS — Z9981 Dependence on supplemental oxygen: Secondary | ICD-10-CM | POA: Diagnosis not present

## 2015-03-09 DIAGNOSIS — J9622 Acute and chronic respiratory failure with hypercapnia: Secondary | ICD-10-CM | POA: Diagnosis not present

## 2015-03-09 DIAGNOSIS — D649 Anemia, unspecified: Secondary | ICD-10-CM | POA: Diagnosis not present

## 2015-03-09 DIAGNOSIS — J441 Chronic obstructive pulmonary disease with (acute) exacerbation: Secondary | ICD-10-CM | POA: Diagnosis not present

## 2015-03-09 DIAGNOSIS — Z8701 Personal history of pneumonia (recurrent): Secondary | ICD-10-CM | POA: Diagnosis not present

## 2015-03-09 DIAGNOSIS — Z87891 Personal history of nicotine dependence: Secondary | ICD-10-CM | POA: Diagnosis not present

## 2015-03-10 DIAGNOSIS — D649 Anemia, unspecified: Secondary | ICD-10-CM | POA: Diagnosis not present

## 2015-03-10 DIAGNOSIS — Z9981 Dependence on supplemental oxygen: Secondary | ICD-10-CM | POA: Diagnosis not present

## 2015-03-10 DIAGNOSIS — J441 Chronic obstructive pulmonary disease with (acute) exacerbation: Secondary | ICD-10-CM | POA: Diagnosis not present

## 2015-03-10 DIAGNOSIS — J9622 Acute and chronic respiratory failure with hypercapnia: Secondary | ICD-10-CM | POA: Diagnosis not present

## 2015-03-10 DIAGNOSIS — Z8701 Personal history of pneumonia (recurrent): Secondary | ICD-10-CM | POA: Diagnosis not present

## 2015-03-10 DIAGNOSIS — Z87891 Personal history of nicotine dependence: Secondary | ICD-10-CM | POA: Diagnosis not present

## 2015-03-13 DIAGNOSIS — D649 Anemia, unspecified: Secondary | ICD-10-CM | POA: Diagnosis not present

## 2015-03-13 DIAGNOSIS — J9622 Acute and chronic respiratory failure with hypercapnia: Secondary | ICD-10-CM | POA: Diagnosis not present

## 2015-03-13 DIAGNOSIS — Z87891 Personal history of nicotine dependence: Secondary | ICD-10-CM | POA: Diagnosis not present

## 2015-03-13 DIAGNOSIS — J441 Chronic obstructive pulmonary disease with (acute) exacerbation: Secondary | ICD-10-CM | POA: Diagnosis not present

## 2015-03-13 DIAGNOSIS — Z9981 Dependence on supplemental oxygen: Secondary | ICD-10-CM | POA: Diagnosis not present

## 2015-03-13 DIAGNOSIS — Z8701 Personal history of pneumonia (recurrent): Secondary | ICD-10-CM | POA: Diagnosis not present

## 2015-03-15 DIAGNOSIS — D649 Anemia, unspecified: Secondary | ICD-10-CM | POA: Diagnosis not present

## 2015-03-15 DIAGNOSIS — J441 Chronic obstructive pulmonary disease with (acute) exacerbation: Secondary | ICD-10-CM | POA: Diagnosis not present

## 2015-03-15 DIAGNOSIS — Z9981 Dependence on supplemental oxygen: Secondary | ICD-10-CM | POA: Diagnosis not present

## 2015-03-15 DIAGNOSIS — J9622 Acute and chronic respiratory failure with hypercapnia: Secondary | ICD-10-CM | POA: Diagnosis not present

## 2015-03-15 DIAGNOSIS — Z8701 Personal history of pneumonia (recurrent): Secondary | ICD-10-CM | POA: Diagnosis not present

## 2015-03-15 DIAGNOSIS — Z87891 Personal history of nicotine dependence: Secondary | ICD-10-CM | POA: Diagnosis not present

## 2015-03-17 DIAGNOSIS — J441 Chronic obstructive pulmonary disease with (acute) exacerbation: Secondary | ICD-10-CM | POA: Diagnosis not present

## 2015-03-17 DIAGNOSIS — Z87891 Personal history of nicotine dependence: Secondary | ICD-10-CM | POA: Diagnosis not present

## 2015-03-17 DIAGNOSIS — D649 Anemia, unspecified: Secondary | ICD-10-CM | POA: Diagnosis not present

## 2015-03-17 DIAGNOSIS — Z9981 Dependence on supplemental oxygen: Secondary | ICD-10-CM | POA: Diagnosis not present

## 2015-03-17 DIAGNOSIS — Z8701 Personal history of pneumonia (recurrent): Secondary | ICD-10-CM | POA: Diagnosis not present

## 2015-03-17 DIAGNOSIS — J9622 Acute and chronic respiratory failure with hypercapnia: Secondary | ICD-10-CM | POA: Diagnosis not present

## 2015-04-24 DIAGNOSIS — J9611 Chronic respiratory failure with hypoxia: Secondary | ICD-10-CM | POA: Diagnosis not present

## 2015-04-24 DIAGNOSIS — B370 Candidal stomatitis: Secondary | ICD-10-CM | POA: Diagnosis not present

## 2015-04-24 DIAGNOSIS — G4733 Obstructive sleep apnea (adult) (pediatric): Secondary | ICD-10-CM | POA: Diagnosis not present

## 2015-04-24 DIAGNOSIS — J449 Chronic obstructive pulmonary disease, unspecified: Secondary | ICD-10-CM | POA: Diagnosis not present

## 2015-04-24 DIAGNOSIS — G4752 REM sleep behavior disorder: Secondary | ICD-10-CM | POA: Diagnosis not present

## 2015-06-15 DIAGNOSIS — G4733 Obstructive sleep apnea (adult) (pediatric): Secondary | ICD-10-CM | POA: Diagnosis not present

## 2015-06-15 DIAGNOSIS — B370 Candidal stomatitis: Secondary | ICD-10-CM | POA: Diagnosis not present

## 2015-06-15 DIAGNOSIS — J309 Allergic rhinitis, unspecified: Secondary | ICD-10-CM | POA: Diagnosis not present

## 2015-06-15 DIAGNOSIS — J9621 Acute and chronic respiratory failure with hypoxia: Secondary | ICD-10-CM | POA: Diagnosis not present

## 2015-06-15 DIAGNOSIS — G4752 REM sleep behavior disorder: Secondary | ICD-10-CM | POA: Diagnosis not present

## 2015-06-15 DIAGNOSIS — J441 Chronic obstructive pulmonary disease with (acute) exacerbation: Secondary | ICD-10-CM | POA: Diagnosis not present

## 2015-06-21 ENCOUNTER — Ambulatory Visit (INDEPENDENT_AMBULATORY_CARE_PROVIDER_SITE_OTHER): Payer: Medicare Other | Admitting: Cardiology

## 2015-06-21 ENCOUNTER — Encounter: Payer: Self-pay | Admitting: Cardiology

## 2015-06-21 VITALS — BP 122/58 | HR 100 | Ht 63.0 in | Wt 115.0 lb

## 2015-06-21 DIAGNOSIS — I471 Supraventricular tachycardia: Secondary | ICD-10-CM | POA: Diagnosis not present

## 2015-06-21 DIAGNOSIS — R002 Palpitations: Secondary | ICD-10-CM

## 2015-06-21 DIAGNOSIS — R0602 Shortness of breath: Secondary | ICD-10-CM

## 2015-06-21 DIAGNOSIS — J449 Chronic obstructive pulmonary disease, unspecified: Secondary | ICD-10-CM

## 2015-06-21 DIAGNOSIS — E785 Hyperlipidemia, unspecified: Secondary | ICD-10-CM

## 2015-06-21 NOTE — Progress Notes (Signed)
Cardiology Office Note  Date: 06/21/2015   ID: [name], DOB Sep 09, 1955, MRN [id number]  PCP: Cassell Smiles., MD  Evaluating Cardiologist: Nona Dell, MD   Chief Complaint  Patient presents with  . Cardiac follow-up    History of Present Illness: [name] is a 60 y.o. female former patient of Dr. Allyson Sabal, now establishing follow-up with me. This is my first meeting with her in the office. I reviewed records and updated the chart. She last saw Dr. Allyson Sabal in May 2015. She is here today with a friend. Cardiac history includes a reassuring cardiac catheterization in 2005 with no evidence of significant CAD at that time, more recently status post negative dobutamine Myoview in 2014. There is a history of SVT although I do not have details. She seems to be mostly limited by significant oxygen requiring COPD, and follows with Dr. Egbert Garibaldi in [place]. She states that she is fairly sedentary but does a few things around the house.  She tells me that she has noticed intermittent leg edema more regularly in the last several months. I reviewed her medications. She is on chronic steroids and also Cardizem CD. She states that when she checks her blood pressure at home the monitor often reads "irregular." Her resting heart rate is fairly high, not unusual to be 100-110.  She follows at [facility] intermitt[facility]. I do not have recent lab work to review.  I reviewed her ECG performed today which shows sinus rhythm at 90 bpm with low voltage in the early precordial leads and nonspecific T-wave changes.  Past Medical History  Diagnosis Date  . COPD (chronic obstructive pulmonary disease) (HCC)     Home oxygen at [facility]  . Hypoglycemia   . SVT (supraventricular tachycardia) (HCC)   . IBS (irritable bowel syndrome)   . Diverticulosis   . Hyperlipidemia   . History of cardiac catheterization     No significant CAD 2005    Past Surgical History  Procedure Laterality Date  . Spinal fusion     . Back surgery    . Colonoscopy  03/22/2011    Procedure: COLONOSCOPY;  Surgeon: Malissa Hippo, MD;  Location: [facility];  Service: Endoscopy;  Laterality: N/A;    Current Outpatient Prescriptions  Medication Sig Dispense Refill  . albuterol (PROAIR HFA) 108 (90 BASE) MCG/ACT inhaler Inhale 2 puffs into the lungs every 6 (six) hours as needed. For shortness of breath    . ALPRAZolam (XANAX) 0.5 MG tablet Take 0.5 mg by mouth daily as needed. anxiety    . aspirin 81 MG tablet Take 81 mg by mouth daily.    Marland Kitchen atorvastatin (LIPITOR) 40 MG tablet Take 1 tablet (40 mg total) by mouth daily. 90 tablet 3  . cetirizine (ZYRTEC) 10 MG tablet Take 10 mg by mouth daily.      . clonazePAM (KLONOPIN) 0.5 MG tablet Take 1 mg by mouth at bedtime.     Marland Kitchen diltiazem (CARDIZEM CD) 180 MG 24 hr capsule Take 1 capsule (180 mg total) by mouth daily. 30 capsule 3  . escitalopram (LEXAPRO) 20 MG tablet Take 20 mg by mouth daily.      . fluconazole (DIFLUCAN) 100 MG tablet Take 1 tablet (100 mg total) by mouth daily. (Patient taking differently: Take 100 mg by mouth every Monday at 6 PM. ) 9 tablet 0  . fluconazole (DIFLUCAN) 150 MG tablet Take 150 mg by mouth once a week.     . Fluticasone-Salmeterol (  ADVAIR) 250-50 MCG/DOSE AEPB Inhale 1 puff into the lungs 2 (two) times daily.     Marland Kitchen guaiFENesin (MUCINEX) 600 MG 12 hr tablet Take 1 tablet (600 mg total) by mouth 2 (two) times daily. 20 tablet 0  . ibuprofen (ADVIL,MOTRIN) 200 MG tablet Take 400 mg by mouth 2 (two) times daily as needed for mild pain or moderate pain.    Marland Kitchen ipratropium-albuterol (DUONEB) 0.5-2.5 (3) MG/3ML SOLN Take 3 mLs by nebulization 3 (three) times daily. 360 mL 1  . montelukast (SINGULAIR) 10 MG tablet Take 1 tablet (10 mg total) by mouth at bedtime. 30 tablet 1  . Multiple Vitamins-Minerals (MULTIVITAMINS THER. W/MINERALS) TABS Take 1 tablet by mouth daily.      Marland Kitchen oxybutynin (DITROPAN) 5 MG tablet Take 5 mg by mouth daily.     .  predniSONE (DELTASONE) 10 MG tablet Take 60 mg x 3 days then 40 mg x 3 days then 30 mg x 3 days then 20mg  x 3 days then resume maintenance dose 10 mg daily 60 tablet 2  . roflumilast (DALIRESP) 500 MCG TABS tablet Take 500 mcg by mouth daily.      Marland Kitchen SPIRIVA RESPIMAT 2.5 MCG/ACT AERS Inhale 1 puff into the lungs daily.     Marland Kitchen triamcinolone (NASACORT ALLERGY 24HR) 55 MCG/ACT AERO nasal inhaler Place 2 sprays into the nose daily as needed (FOR ALLERGIES).    Marland Kitchen azithromycin (ZITHROMAX) 250 MG tablet      No current facility-administered medications for this visit.   Allergies:  Iohexol; Chantix; Ketek; and Penicillins   Social History: The patient  reports that she quit smoking about 4 years ago. Her smoking use included Cigarettes. She has a 40 pack-year smoking history. She has never used smokeless tobacco. She reports that she drinks alcohol. She reports that she does not use illicit drugs.   ROS:  Please see the history of present illness. Otherwise, complete review of systems is positive for NYHA class III dyspnea which is chronic, intermittent cough, uses oxygen continuously.  All other systems are reviewed and negative.   Physical Exam: VS:  BP 122/58 mmHg  Pulse 100  Ht 5\' 3"  (1.6 m)  Wt 115 lb (52.164 kg)  BMI 20.38 kg/m2  SpO2 99%, BMI Body mass index is 20.38 kg/(m^2).  Wt Readings from Last 3 Encounters:  06/21/15 115 lb (52.164 kg)  01/21/15 109 lb 14.4 oz (49.85 kg)  11/19/14 113 lb 8.6 oz (51.5 kg)    General: Chronically ill-appearing woman seated in a wheelchair wearing oxygen via nasal cannula. HEENT: Conjunctiva and lids normal, oropharynx clear. Neck: Supple, no elevated JVP or carotid bruits, no thyromegaly. Lungs: Breath sounds throughout without wheezing, nonlabored breathing at rest. Cardiac: Distant regular heart sounds. No S3 or significant systolic murmur, no pericardial rub. Abdomen: Soft, nontender, bowel sounds present, no guarding or rebound. Extremities: No  pitting edema, distal pulses 2+. Skin: Warm and dry. Musculoskeletal: No kyphosis. Neuropsychiatric: Alert and oriented x3, affect grossly appropriate.  ECG: I personally reviewed the prior tracing from 01/20/2015 which showed sinus tachycardia with R' in lead V1, nonspecific T-wave changes and prolonged QT interval.  Recent Labwork: 11/15/2014: ALT 19; AST 19 01/20/2015: B Natriuretic Peptide 86.0 01/21/2015: BUN 9; Creatinine, Ser 0.63; Potassium 4.7; Sodium 139; TSH 0.574 02/01/2015: Hemoglobin 11.1*; Platelets 408*   Other Studies Reviewed Today:  Cardiac catheterization 07/05/2003: DESCRIPTION OF OPERATION: After obtaining informed written consent, the patient was brought to the cardiac catheterization laboratory. Right and left  groin was shaved and prepped and draped in the usual sterile fashion. ECG monitor was established. Using the modified Seldinger technique, a 6 French arterial sheath was inserted in the right femoral artery. A 6 French diagnostic catheter was then used to performed diagnostic angiography. This reveals a medium size short left main with no significant disease. The LAD is a large vessel that coursed to the apex and gives rise to one diagonal branch and one large septal perforator. The LAD has no significant disease. First diagonal is a medium size vessel which bifurcates distally with no significant disease. There is a medium size septal perforator with a 50% ostial lesion.  Left circumflex is a large vessel that coursed in the AV groove and gives rise to three obtuse marginal branches. The AV groove circumflex has no significant disease.  The first OM is a large vessel which bifurcates distally with no significant disease. Second and third OMs are small vessels with no significant disease.  The right coronary artery is a large vessel. It is dominant. It gives rise to both PDA as well as posterior lateral branch. There is  mild 30% proximal RCA narrowing. The PDA and posterior lateral branch have no significant disease.  Left ventriculogram reveals a preserved EF at 60%.  Abdominal aortogram reveals minimal distal aortic plaque with widely patent renal arteries.  HEMODYNAMICS: Systemic arterial pressure 117/75, LV pressure 118/9, LVEDP of 13.  CONCLUSIONS: 1. No significant coronary artery disease. 2. Normal left ventricular systolic function. 3. No evidence of renal artery stenosis with minimal plaque in the distal  descending aorta.  Dobutamine Myoview 08/19/2012: Transient Ischemic Dilatation (Normal <1.22): 0.99 Lung/Heart Ratio (Normal <0.45): 0.26 QGS EDV: 45 ml QGS ESV: 8 ml LV Ejection Fraction: 83%  Rest ECG: NSR - Normal EKG  Stress ECG: No significant change from baseline ECG  QPS Raw Data Images: Normal; no motion artifact; normal heart/lung ratio. Stress Images: Normal homogeneous uptake in all areas of the myocardium. Rest Images: Normal homogeneous uptake in all areas of the myocardium. Subtraction (SDS): No evidence of ischemia.  Impression Exercise Capacity: Dobutamine study with no exercise. BP Response: Normal blood pressure response. Clinical Symptoms: No significant symptoms noted. ECG Impression: No significant ST segment change suggestive of ischemia. Comparison with Prior Nuclear Study: No significant change from previous study  Overall Impression: Normal stress nuclear study.  LV Wall Motion: NL LV Function; NL Wall Motion  Assessment and Plan:  1. History of SVT, details not clear. Patient states that her blood pressure cuff often reads "irregular" referring to her pulse. She is on Cardizem CD and has a relatively fast heart rate at rest likely related to her COPD with chronic respiratory failure. ECG today shows sinus rhythm at 90 bpm. I have recommended that we obtain a 24-hour Holter monitor to further evaluate heart rate  variability and exclude any obvious arrhythmias.  2. Chronic shortness of breath and severe oxygen requiring COPD. She also reports ankle edema at times. More prominent compared to last year. We will obtain a follow-up echocardiogram to reassess cardiac structure and function.  3. Previously documented history of normal coronary arteries as outlined above with reassuring Myoview study within the last 3 years.  4. Hyperlipidemia, on Lipitor. Patient follows at [facility].  Current medicines were reviewed with the patient today.   Orders Placed This Encounter  Procedures  . Holter monitor - 48 hour  . EKG 12-Lead  . Echocardiogram    Disposition: FU with me in 6  months.   Signed, Jonelle Sidle, MD, [facility] 06/21/2015 2:18 PM    [facility] at [facility] [address] [place], [place] [address] Phone: [phone]; Fax: [phone]

## 2015-06-21 NOTE — Patient Instructions (Signed)
Your physician wants you to follow-up in: 6 months You will receive a reminder letter in the mail two months in advance. If you don't receive a letter, please call our office to schedule the follow-up appointment.   Your physician recommends that you continue on your current medications as directed. Please refer to the Current Medication list given to you today.   Your physician has recommended that you wear a holter monitor. Holter monitors are medical devices that record the heart's electrical activity. Doctors most often use these monitors to diagnose arrhythmias. Arrhythmias are problems with the speed or rhythm of the heartbeat. The monitor is a small, portable device. You can wear one while you do your normal daily activities. This is usually used to diagnose what is causing palpitations/syncope (passing out).    Your physician has requested that you have an echocardiogram. Echocardiography is a painless test that uses sound waves to create images of your heart. It provides your doctor with information about the size and shape of your heart and how well your heart's chambers and valves are working. This procedure takes approximately one hour. There are no restrictions for this procedure.     Thank you for choosing [facility] !

## 2015-06-26 ENCOUNTER — Ambulatory Visit (HOSPITAL_BASED_OUTPATIENT_CLINIC_OR_DEPARTMENT_OTHER)
Admission: RE | Admit: 2015-06-26 | Discharge: 2015-06-26 | Disposition: A | Discharge status: Home / Self Care | Payer: Medicare Other | Source: Ambulatory Visit | Attending: Cardiology | Admitting: Cardiology

## 2015-06-26 ENCOUNTER — Ambulatory Visit (HOSPITAL_COMMUNITY)
Admission: RE | Admit: 2015-06-26 | Discharge: 2015-06-26 | Disposition: A | Discharge status: Home / Self Care | Payer: Medicare Other | Source: Ambulatory Visit | Attending: Cardiology | Admitting: Cardiology

## 2015-06-26 DIAGNOSIS — R0602 Shortness of breath: Secondary | ICD-10-CM | POA: Diagnosis not present

## 2015-06-26 DIAGNOSIS — I371 Nonrheumatic pulmonary valve insufficiency: Secondary | ICD-10-CM | POA: Insufficient documentation

## 2015-06-26 DIAGNOSIS — I081 Rheumatic disorders of both mitral and tricuspid valves: Secondary | ICD-10-CM | POA: Diagnosis not present

## 2015-06-26 DIAGNOSIS — Z9981 Dependence on supplemental oxygen: Secondary | ICD-10-CM | POA: Insufficient documentation

## 2015-06-26 DIAGNOSIS — E785 Hyperlipidemia, unspecified: Secondary | ICD-10-CM | POA: Diagnosis not present

## 2015-06-26 DIAGNOSIS — I471 Supraventricular tachycardia: Secondary | ICD-10-CM | POA: Diagnosis not present

## 2015-06-26 DIAGNOSIS — J449 Chronic obstructive pulmonary disease, unspecified: Secondary | ICD-10-CM | POA: Insufficient documentation

## 2015-06-26 DIAGNOSIS — R002 Palpitations: Secondary | ICD-10-CM | POA: Diagnosis not present

## 2015-06-26 DIAGNOSIS — G4730 Sleep apnea, unspecified: Secondary | ICD-10-CM | POA: Diagnosis not present

## 2015-08-03 DIAGNOSIS — J309 Allergic rhinitis, unspecified: Secondary | ICD-10-CM | POA: Diagnosis not present

## 2015-08-03 DIAGNOSIS — G4752 REM sleep behavior disorder: Secondary | ICD-10-CM | POA: Diagnosis not present

## 2015-08-03 DIAGNOSIS — J449 Chronic obstructive pulmonary disease, unspecified: Secondary | ICD-10-CM | POA: Diagnosis not present

## 2015-08-03 DIAGNOSIS — J9611 Chronic respiratory failure with hypoxia: Secondary | ICD-10-CM | POA: Diagnosis not present

## 2015-08-03 DIAGNOSIS — G4733 Obstructive sleep apnea (adult) (pediatric): Secondary | ICD-10-CM | POA: Diagnosis not present

## 2015-08-21 ENCOUNTER — Encounter: Payer: Self-pay | Admitting: Cardiology

## 2015-08-22 ENCOUNTER — Other Ambulatory Visit: Payer: Self-pay

## 2015-08-22 MED ORDER — DILTIAZEM HCL ER COATED BEADS 180 MG PO CP24: 180.0000 mg | ORAL_CAPSULE | Freq: Every day | ORAL | Status: DC

## 2015-09-13 DIAGNOSIS — G4733 Obstructive sleep apnea (adult) (pediatric): Secondary | ICD-10-CM | POA: Diagnosis not present

## 2015-09-13 DIAGNOSIS — J9611 Chronic respiratory failure with hypoxia: Secondary | ICD-10-CM | POA: Diagnosis not present

## 2015-09-13 DIAGNOSIS — G4752 REM sleep behavior disorder: Secondary | ICD-10-CM | POA: Diagnosis not present

## 2015-09-13 DIAGNOSIS — J309 Allergic rhinitis, unspecified: Secondary | ICD-10-CM | POA: Diagnosis not present

## 2015-09-13 DIAGNOSIS — R079 Chest pain, unspecified: Secondary | ICD-10-CM | POA: Diagnosis not present

## 2015-09-13 DIAGNOSIS — B370 Candidal stomatitis: Secondary | ICD-10-CM | POA: Diagnosis not present

## 2015-09-13 DIAGNOSIS — J449 Chronic obstructive pulmonary disease, unspecified: Secondary | ICD-10-CM | POA: Diagnosis not present

## 2015-11-01 ENCOUNTER — Encounter: Payer: Self-pay | Admitting: Cardiology

## 2015-11-01 ENCOUNTER — Other Ambulatory Visit: Payer: Self-pay

## 2015-11-01 MED ORDER — ATORVASTATIN CALCIUM 40 MG PO TABS: 40.0000 mg | ORAL_TABLET | Freq: Every day | ORAL | Status: DC

## 2015-11-02 DIAGNOSIS — Z0000 Encounter for general adult medical examination without abnormal findings: Secondary | ICD-10-CM | POA: Diagnosis not present

## 2015-11-02 DIAGNOSIS — Z6821 Body mass index (BMI) 21.0-21.9, adult: Secondary | ICD-10-CM | POA: Diagnosis not present

## 2015-11-02 DIAGNOSIS — J449 Chronic obstructive pulmonary disease, unspecified: Secondary | ICD-10-CM | POA: Diagnosis not present

## 2015-12-28 ENCOUNTER — Other Ambulatory Visit: Payer: Self-pay | Admitting: Cardiology

## 2016-01-31 DIAGNOSIS — Z23 Encounter for immunization: Secondary | ICD-10-CM | POA: Diagnosis not present

## 2016-01-31 DIAGNOSIS — G4752 REM sleep behavior disorder: Secondary | ICD-10-CM | POA: Diagnosis not present

## 2016-01-31 DIAGNOSIS — J449 Chronic obstructive pulmonary disease, unspecified: Secondary | ICD-10-CM | POA: Diagnosis not present

## 2016-01-31 DIAGNOSIS — J9611 Chronic respiratory failure with hypoxia: Secondary | ICD-10-CM | POA: Diagnosis not present

## 2016-01-31 DIAGNOSIS — B370 Candidal stomatitis: Secondary | ICD-10-CM | POA: Diagnosis not present

## 2016-01-31 DIAGNOSIS — G4733 Obstructive sleep apnea (adult) (pediatric): Secondary | ICD-10-CM | POA: Diagnosis not present

## 2016-02-28 NOTE — Progress Notes (Signed)
Cardiology Office Note  Date: 02/29/2016   ID: [name], DOB 04-10-1956, MRN [id number]  PCP: Cassell Smiles., MD  Primary Cardiologist: Nona Dell, MD   Chief Complaint  Patient presents with  . Cardiac follow-up    History of Present Illness: [name] is a 60 y.o. female last seen in February. She presents for a routine follow-up visit. Reports no major change in health status since I last saw her. Continues on supplemental oxygen with severe COPD, follows with Dr. Egbert Garibaldi in [place]. She reports occasional sense of palpitations, but nothing prolonged, and has been satisfied with her current dose of Cardizem.  Cardiac monitor from February showed sinus rhythm and sinus tachycardia with rare PACs and PVCs. There were no sustained arrhythmias.  Past Medical History:  Diagnosis Date  . COPD (chronic obstructive pulmonary disease) (HCC)    Home oxygen at [facility]  . Diverticulosis   . History of cardiac catheterization    No significant CAD 2005  . Hyperlipidemia   . Hypoglycemia   . IBS (irritable bowel syndrome)   . SVT (supraventricular tachycardia) (HCC)     Current Outpatient Prescriptions  Medication Sig Dispense Refill  . albuterol (PROAIR HFA) 108 (90 BASE) MCG/ACT inhaler Inhale 2 puffs into the lungs every 6 (six) hours as needed. For shortness of breath    . ALPRAZolam (XANAX) 0.5 MG tablet Take 0.5 mg by mouth daily as needed. anxiety    . aspirin 81 MG tablet Take 81 mg by mouth daily.    Marland Kitchen atorvastatin (LIPITOR) 40 MG tablet Take 1 tablet (40 mg total) by mouth daily. 90 tablet 3  . azithromycin (ZITHROMAX) 250 MG tablet     . cetirizine (ZYRTEC) 10 MG tablet Take 10 mg by mouth daily.      . clonazePAM (KLONOPIN) 0.5 MG tablet Take 1 mg by mouth at bedtime.     Marland Kitchen diltiazem (CARDIZEM CD) 180 MG 24 hr capsule Take 1 capsule (180 mg total) by mouth daily. 90 capsule 3  . escitalopram (LEXAPRO) 20 MG tablet Take 20 mg by mouth daily.      .  fluconazole (DIFLUCAN) 100 MG tablet Take 1 tablet (100 mg total) by mouth daily. (Patient taking differently: Take 100 mg by mouth every Monday at 6 PM. ) 9 tablet 0  . fluconazole (DIFLUCAN) 150 MG tablet Take 150 mg by mouth once a week.     . Fluticasone-Salmeterol (ADVAIR) 250-50 MCG/DOSE AEPB Inhale 1 puff into the lungs 2 (two) times daily.     Marland Kitchen guaiFENesin (MUCINEX) 600 MG 12 hr tablet Take 1 tablet (600 mg total) by mouth 2 (two) times daily. 20 tablet 0  . ibuprofen (ADVIL,MOTRIN) 200 MG tablet Take 400 mg by mouth 2 (two) times daily as needed for mild pain or moderate pain.    Marland Kitchen ipratropium-albuterol (DUONEB) 0.5-2.5 (3) MG/3ML SOLN Take 3 mLs by nebulization 3 (three) times daily. 360 mL 1  . montelukast (SINGULAIR) 10 MG tablet Take 1 tablet (10 mg total) by mouth at bedtime. 30 tablet 1  . Multiple Vitamins-Minerals (MULTIVITAMINS THER. W/MINERALS) TABS Take 1 tablet by mouth daily.      Marland Kitchen oxybutynin (DITROPAN) 5 MG tablet Take 5 mg by mouth daily.     . predniSONE (DELTASONE) 10 MG tablet Take 60 mg x 3 days then 40 mg x 3 days then 30 mg x 3 days then 20mg  x 3 days then resume maintenance dose 10 mg daily 60  tablet 2  . roflumilast (DALIRESP) 500 MCG TABS tablet Take 500 mcg by mouth daily.      Marland Kitchen SPIRIVA RESPIMAT 2.5 MCG/ACT AERS Inhale 1 puff into the lungs daily.     Marland Kitchen triamcinolone (NASACORT ALLERGY 24HR) 55 MCG/ACT AERO nasal inhaler Place 2 sprays into the nose daily as needed (FOR ALLERGIES).     No current facility-administered medications for this visit.    Allergies:  Iohexol; Chantix [varenicline]; Ketek [telithromycin]; and Penicillins   Social History: The patient  reports that she quit smoking about 4 years ago. Her smoking use included Cigarettes. She has a 40.00 pack-year smoking history. She has never used smokeless tobacco. She reports that she drinks alcohol. She reports that she does not use drugs.   ROS:  Please see the history of present illness. Otherwise,  complete review of systems is positive for chronic shortness of breath.  All other systems are reviewed and negative.   Physical Exam: VS:  BP 118/64   Pulse 100   Ht 5\' 3"  (1.6 m)   Wt 119 lb (54 kg)   SpO2 98%   BMI 21.08 kg/m , BMI Body mass index is 21.08 kg/m.  Wt Readings from Last 3 Encounters:  02/29/16 119 lb (54 kg)  06/21/15 115 lb (52.2 kg)  01/21/15 109 lb 14.4 oz (49.9 kg)    General: Chronically ill-appearing woman seated in a wheelchair wearing oxygen via nasal cannula. HEENT: Conjunctiva and lids normal, oropharynx clear. Neck: Supple, no elevated JVP or carotid bruits, no thyromegaly. Lungs: Breath sounds throughout without wheezing, nonlabored breathing at rest. Cardiac: Distant regular heart sounds. No S3 or significant systolic murmur, no pericardial rub. Abdomen: Soft, nontender, bowel sounds present, no guarding or rebound. Extremities: No pitting edema, distal pulses 2+.  ECG: I personally reviewed the tracing from 06/21/2015 which showed sinus rhythm with lead artifact and nonspecific changes.  Other Studies Reviewed Today:  Echocardiogram 06/26/2015: Study Conclusions  - Left ventricle: The cavity size was normal. Wall thickness was   normal. Systolic function was normal. The estimated ejection   fraction was in the range of 60% to 65%. Wall motion was normal;   there were no regional wall motion abnormalities. Doppler   parameters are consistent with abnormal left ventricular   relaxation (grade 1 diastolic dysfunction). - Left atrium: The atrium was mildly dilated.  Assessment and Plan:  1. History of PSVT, planning to continue current dose of Cardizem CD at 180 mg daily for now. She has a relatively fast resting heart rate driven by severe COPD. If breakthrough rapid palpitations increase, would consider further advancing her Cardizem CD dose.  2. Severe oxygen-dependent COPD. Keep follow-up with Pulmonology.  Current medicines were reviewed  with the patient today.   Disposition: Follow-up with me in 6 months.  Signed, Jonelle Sidle, MD, [facility] 02/29/2016 1:34 PM    [facility] at [facility] [address] [place], [place] [address] Phone: [phone]; Fax: [phone]

## 2016-02-29 ENCOUNTER — Ambulatory Visit (INDEPENDENT_AMBULATORY_CARE_PROVIDER_SITE_OTHER): Payer: Medicare Other | Admitting: Cardiology

## 2016-02-29 ENCOUNTER — Encounter: Payer: Self-pay | Admitting: Cardiology

## 2016-02-29 VITALS — BP 118/64 | HR 100 | Ht 63.0 in | Wt 119.0 lb

## 2016-02-29 DIAGNOSIS — I471 Supraventricular tachycardia: Secondary | ICD-10-CM | POA: Diagnosis not present

## 2016-02-29 DIAGNOSIS — J449 Chronic obstructive pulmonary disease, unspecified: Secondary | ICD-10-CM

## 2016-02-29 MED ORDER — DILTIAZEM HCL ER COATED BEADS 180 MG PO CP24: 180.0000 mg | ORAL_CAPSULE | Freq: Every day | ORAL | 3 refills | Status: DC

## 2016-02-29 MED ORDER — ATORVASTATIN CALCIUM 40 MG PO TABS: 40.0000 mg | ORAL_TABLET | Freq: Every day | ORAL | 3 refills | Status: DC

## 2016-02-29 NOTE — Patient Instructions (Signed)
Your physician wants you to follow-up in: 6 months You will receive a reminder letter in the mail two months in advance. If you don't receive a letter, please call our office to schedule the follow-up appointment.     Your physician recommends that you continue on your current medications as directed. Please refer to the Current Medication list given to you today.      Thank you for choosing [facility] !        

## 2016-03-04 DIAGNOSIS — D225 Melanocytic nevi of trunk: Secondary | ICD-10-CM | POA: Diagnosis not present

## 2016-03-04 DIAGNOSIS — Z1283 Encounter for screening for malignant neoplasm of skin: Secondary | ICD-10-CM | POA: Diagnosis not present

## 2016-03-04 DIAGNOSIS — C44529 Squamous cell carcinoma of skin of other part of trunk: Secondary | ICD-10-CM | POA: Diagnosis not present

## 2016-03-18 ENCOUNTER — Encounter: Payer: Self-pay | Admitting: Cardiology

## 2016-03-20 ENCOUNTER — Encounter (INDEPENDENT_AMBULATORY_CARE_PROVIDER_SITE_OTHER): Payer: Self-pay | Admitting: *Deleted

## 2016-03-20 DIAGNOSIS — J9611 Chronic respiratory failure with hypoxia: Secondary | ICD-10-CM | POA: Diagnosis not present

## 2016-03-20 DIAGNOSIS — J449 Chronic obstructive pulmonary disease, unspecified: Secondary | ICD-10-CM | POA: Diagnosis not present

## 2016-03-20 DIAGNOSIS — G4733 Obstructive sleep apnea (adult) (pediatric): Secondary | ICD-10-CM | POA: Diagnosis not present

## 2016-03-20 DIAGNOSIS — G4752 REM sleep behavior disorder: Secondary | ICD-10-CM | POA: Diagnosis not present

## 2016-03-26 DIAGNOSIS — Z08 Encounter for follow-up examination after completed treatment for malignant neoplasm: Secondary | ICD-10-CM | POA: Diagnosis not present

## 2016-03-26 DIAGNOSIS — Z85828 Personal history of other malignant neoplasm of skin: Secondary | ICD-10-CM | POA: Diagnosis not present

## 2016-05-13 ENCOUNTER — Emergency Department (HOSPITAL_COMMUNITY): Payer: Medicare Other

## 2016-05-13 ENCOUNTER — Encounter (HOSPITAL_COMMUNITY): Payer: Self-pay | Admitting: Emergency Medicine

## 2016-05-13 ENCOUNTER — Inpatient Hospital Stay (HOSPITAL_COMMUNITY)
Admission: EM | Admit: 2016-05-13 | Discharge: 2016-05-28 | DRG: 189 | Disposition: A | Discharge status: Skilled Nursing Facility | Payer: Medicare Other | Attending: Nephrology | Admitting: Nephrology

## 2016-05-13 DIAGNOSIS — J189 Pneumonia, unspecified organism: Secondary | ICD-10-CM | POA: Diagnosis not present

## 2016-05-13 DIAGNOSIS — D72829 Elevated white blood cell count, unspecified: Secondary | ICD-10-CM | POA: Diagnosis present

## 2016-05-13 DIAGNOSIS — J9622 Acute and chronic respiratory failure with hypercapnia: Secondary | ICD-10-CM | POA: Diagnosis present

## 2016-05-13 DIAGNOSIS — Z7982 Long term (current) use of aspirin: Secondary | ICD-10-CM | POA: Diagnosis not present

## 2016-05-13 DIAGNOSIS — J9621 Acute and chronic respiratory failure with hypoxia: Secondary | ICD-10-CM | POA: Diagnosis not present

## 2016-05-13 DIAGNOSIS — A609 Anogenital herpesviral infection, unspecified: Secondary | ICD-10-CM

## 2016-05-13 DIAGNOSIS — R0682 Tachypnea, not elsewhere classified: Secondary | ICD-10-CM | POA: Diagnosis not present

## 2016-05-13 DIAGNOSIS — Z9981 Dependence on supplemental oxygen: Secondary | ICD-10-CM | POA: Diagnosis not present

## 2016-05-13 DIAGNOSIS — F29 Unspecified psychosis not due to a substance or known physiological condition: Secondary | ICD-10-CM | POA: Diagnosis present

## 2016-05-13 DIAGNOSIS — T380X5A Adverse effect of glucocorticoids and synthetic analogues, initial encounter: Secondary | ICD-10-CM | POA: Diagnosis present

## 2016-05-13 DIAGNOSIS — I5032 Chronic diastolic (congestive) heart failure: Secondary | ICD-10-CM | POA: Diagnosis present

## 2016-05-13 DIAGNOSIS — R41841 Cognitive communication deficit: Secondary | ICD-10-CM | POA: Diagnosis not present

## 2016-05-13 DIAGNOSIS — M6281 Muscle weakness (generalized): Secondary | ICD-10-CM

## 2016-05-13 DIAGNOSIS — J441 Chronic obstructive pulmonary disease with (acute) exacerbation: Secondary | ICD-10-CM | POA: Diagnosis present

## 2016-05-13 DIAGNOSIS — E785 Hyperlipidemia, unspecified: Secondary | ICD-10-CM | POA: Diagnosis not present

## 2016-05-13 DIAGNOSIS — Z9119 Patient's noncompliance with other medical treatment and regimen: Secondary | ICD-10-CM | POA: Diagnosis not present

## 2016-05-13 DIAGNOSIS — Z7401 Bed confinement status: Secondary | ICD-10-CM | POA: Diagnosis not present

## 2016-05-13 DIAGNOSIS — D72823 Leukemoid reaction: Secondary | ICD-10-CM | POA: Diagnosis not present

## 2016-05-13 DIAGNOSIS — J9690 Respiratory failure, unspecified, unspecified whether with hypoxia or hypercapnia: Secondary | ICD-10-CM

## 2016-05-13 DIAGNOSIS — J9612 Chronic respiratory failure with hypercapnia: Secondary | ICD-10-CM | POA: Diagnosis present

## 2016-05-13 DIAGNOSIS — F17210 Nicotine dependence, cigarettes, uncomplicated: Secondary | ICD-10-CM | POA: Diagnosis not present

## 2016-05-13 DIAGNOSIS — I482 Chronic atrial fibrillation: Secondary | ICD-10-CM | POA: Diagnosis not present

## 2016-05-13 DIAGNOSIS — Z981 Arthrodesis status: Secondary | ICD-10-CM

## 2016-05-13 DIAGNOSIS — D539 Nutritional anemia, unspecified: Secondary | ICD-10-CM | POA: Diagnosis not present

## 2016-05-13 DIAGNOSIS — F418 Other specified anxiety disorders: Secondary | ICD-10-CM | POA: Diagnosis present

## 2016-05-13 DIAGNOSIS — I2510 Atherosclerotic heart disease of native coronary artery without angina pectoris: Secondary | ICD-10-CM | POA: Diagnosis not present

## 2016-05-13 DIAGNOSIS — R0602 Shortness of breath: Secondary | ICD-10-CM

## 2016-05-13 DIAGNOSIS — E872 Acidosis: Secondary | ICD-10-CM | POA: Diagnosis not present

## 2016-05-13 DIAGNOSIS — R062 Wheezing: Secondary | ICD-10-CM | POA: Diagnosis not present

## 2016-05-13 DIAGNOSIS — R05 Cough: Secondary | ICD-10-CM | POA: Diagnosis not present

## 2016-05-13 DIAGNOSIS — J9602 Acute respiratory failure with hypercapnia: Secondary | ICD-10-CM | POA: Diagnosis not present

## 2016-05-13 DIAGNOSIS — R2689 Other abnormalities of gait and mobility: Secondary | ICD-10-CM | POA: Diagnosis not present

## 2016-05-13 DIAGNOSIS — I110 Hypertensive heart disease with heart failure: Secondary | ICD-10-CM | POA: Diagnosis not present

## 2016-05-13 DIAGNOSIS — Z87891 Personal history of nicotine dependence: Secondary | ICD-10-CM | POA: Diagnosis not present

## 2016-05-13 DIAGNOSIS — Z79899 Other long term (current) drug therapy: Secondary | ICD-10-CM

## 2016-05-13 DIAGNOSIS — R279 Unspecified lack of coordination: Secondary | ICD-10-CM | POA: Diagnosis not present

## 2016-05-13 DIAGNOSIS — J9611 Chronic respiratory failure with hypoxia: Secondary | ICD-10-CM | POA: Diagnosis present

## 2016-05-13 DIAGNOSIS — R5381 Other malaise: Secondary | ICD-10-CM

## 2016-05-13 DIAGNOSIS — J9620 Acute and chronic respiratory failure, unspecified whether with hypoxia or hypercapnia: Secondary | ICD-10-CM | POA: Diagnosis present

## 2016-05-13 DIAGNOSIS — R0902 Hypoxemia: Secondary | ICD-10-CM

## 2016-05-13 DIAGNOSIS — Z7952 Long term (current) use of systemic steroids: Secondary | ICD-10-CM

## 2016-05-13 DIAGNOSIS — F331 Major depressive disorder, recurrent, moderate: Secondary | ICD-10-CM | POA: Diagnosis not present

## 2016-05-13 LAB — BLOOD GAS, ARTERIAL
ACID-BASE EXCESS: 9.6 mmol/L — AB (ref 0.0–2.0)
ACID-BASE EXCESS: 9.2 mmol/L — AB (ref 0.0–2.0)
Bicarbonate: 28.6 mmol/L — ABNORMAL HIGH (ref 20.0–28.0)
Bicarbonate: 28.7 mmol/L — ABNORMAL HIGH (ref 20.0–28.0)
DELIVERY SYSTEMS: POSITIVE
DRAWN BY: 317771
Delivery systems: POSITIVE
Drawn by: 317771
EXPIRATORY PAP: 6
Expiratory PAP: 7
FIO2: 0.5
FIO2: 0.5
INSPIRATORY PAP: 14
Inspiratory PAP: 12
LHR: 14 {breaths}/min
O2 Content: 50 L/min
O2 Content: 50 L/min
O2 SAT: 96.3 %
O2 SAT: 84.8 %
PCO2 ART: 98.6 mmHg — AB (ref 32.0–48.0)
PCO2 ART: 101 mmHg — AB (ref 32.0–48.0)
PH ART: 7.205 — AB (ref 7.350–7.450)
PH ART: 7.192 — AB (ref 7.350–7.450)
PO2 ART: 119 mmHg — AB (ref 83.0–108.0)
PO2 ART: 63.1 mmHg — AB (ref 83.0–108.0)
RATE: 14 resp/min

## 2016-05-13 LAB — CBC WITH DIFFERENTIAL/PLATELET
Basophils Absolute: 0 10*3/uL (ref 0.0–0.1)
Basophils Relative: 0 %
EOS ABS: 0 10*3/uL (ref 0.0–0.7)
Eosinophils Relative: 0 %
HEMATOCRIT: 39.1 % (ref 36.0–46.0)
HEMOGLOBIN: 12.7 g/dL (ref 12.0–15.0)
LYMPHS ABS: 1.8 10*3/uL (ref 0.7–4.0)
Lymphocytes Relative: 10 %
MCH: 32.7 pg (ref 26.0–34.0)
MCHC: 32.5 g/dL (ref 30.0–36.0)
MCV: 100.8 fL — ABNORMAL HIGH (ref 78.0–100.0)
MONOS PCT: 4 %
Monocytes Absolute: 0.7 10*3/uL (ref 0.1–1.0)
NEUTROS ABS: 15.3 10*3/uL — AB (ref 1.7–7.7)
NEUTROS PCT: 86 %
Platelets: 363 10*3/uL (ref 150–400)
RBC: 3.88 MIL/uL (ref 3.87–5.11)
RDW: 12.5 % (ref 11.5–15.5)
WBC: 17.9 10*3/uL — AB (ref 4.0–10.5)

## 2016-05-13 LAB — COMPREHENSIVE METABOLIC PANEL
ALT: 19 U/L (ref 14–54)
ANION GAP: 8 (ref 5–15)
AST: 23 U/L (ref 15–41)
Albumin: 3.7 g/dL (ref 3.5–5.0)
Alkaline Phosphatase: 88 U/L (ref 38–126)
BUN: 13 mg/dL (ref 6–20)
CALCIUM: 8.9 mg/dL (ref 8.9–10.3)
CHLORIDE: 94 mmol/L — AB (ref 101–111)
CO2: 35 mmol/L — AB (ref 22–32)
Creatinine, Ser: 0.78 mg/dL (ref 0.44–1.00)
GFR calc non Af Amer: >60 mL/min (ref >60)
GLUCOSE: 192 mg/dL — AB (ref 65–99)
POTASSIUM: 4.3 mmol/L (ref 3.5–5.1)
SODIUM: 137 mmol/L (ref 135–145)
Total Bilirubin: 0.3 mg/dL (ref 0.3–1.2)
Total Protein: 7.2 g/dL (ref 6.5–8.1)

## 2016-05-13 LAB — TSH: TSH: 1.14 u[IU]/mL (ref 0.350–4.500)

## 2016-05-13 LAB — BRAIN NATRIURETIC PEPTIDE: B Natriuretic Peptide: 67 pg/mL (ref 0.0–100.0)

## 2016-05-13 LAB — TROPONIN I

## 2016-05-13 MED ORDER — DILTIAZEM HCL ER COATED BEADS 180 MG PO CP24
180.0000 mg | ORAL_CAPSULE | Freq: Every day | ORAL | Status: DC
  Administered 2016-05-14 – 2016-05-28 (×15): 180 mg via ORAL
  Filled 2016-05-13 (×16): qty 1

## 2016-05-13 MED ORDER — CLONAZEPAM 0.5 MG PO TABS
0.5000 mg | ORAL_TABLET | Freq: Every day | ORAL | Status: DC
  Administered 2016-05-14 – 2016-05-17 (×5): 0.75 mg via ORAL
  Administered 2016-05-18 – 2016-05-19 (×2): 0.5 mg via ORAL
  Administered 2016-05-20 – 2016-05-23 (×3): 0.75 mg via ORAL
  Administered 2016-05-24 – 2016-05-26 (×3): 0.5 mg via ORAL
  Filled 2016-05-13 (×2): qty 2
  Filled 2016-05-13: qty 1
  Filled 2016-05-13: qty 2
  Filled 2016-05-13: qty 1
  Filled 2016-05-13 (×2): qty 2
  Filled 2016-05-13: qty 1
  Filled 2016-05-13: qty 2
  Filled 2016-05-13 (×2): qty 1
  Filled 2016-05-13: qty 2
  Filled 2016-05-13: qty 1
  Filled 2016-05-13: qty 2

## 2016-05-13 MED ORDER — ENOXAPARIN SODIUM 40 MG/0.4ML ~~LOC~~ SOLN
40.0000 mg | SUBCUTANEOUS | Status: DC
  Administered 2016-05-14 – 2016-05-28 (×15): 40 mg via SUBCUTANEOUS
  Filled 2016-05-13 (×15): qty 0.4

## 2016-05-13 MED ORDER — ATORVASTATIN CALCIUM 40 MG PO TABS
40.0000 mg | ORAL_TABLET | Freq: Every evening | ORAL | Status: DC
  Administered 2016-05-14 – 2016-05-27 (×14): 40 mg via ORAL
  Filled 2016-05-13 (×16): qty 1

## 2016-05-13 MED ORDER — FLUCONAZOLE 100 MG PO TABS
100.0000 mg | ORAL_TABLET | ORAL | Status: DC
  Administered 2016-05-20 – 2016-05-27 (×2): 100 mg via ORAL
  Filled 2016-05-13 (×2): qty 1

## 2016-05-13 MED ORDER — METHYLPREDNISOLONE SODIUM SUCC 40 MG IJ SOLR
40.0000 mg | Freq: Four times a day (QID) | INTRAMUSCULAR | Status: DC
  Administered 2016-05-14 – 2016-05-15 (×5): 40 mg via INTRAVENOUS
  Filled 2016-05-13 (×4): qty 1

## 2016-05-13 MED ORDER — ALBUTEROL SULFATE (2.5 MG/3ML) 0.083% IN NEBU
2.5000 mg | INHALATION_SOLUTION | Freq: Four times a day (QID) | RESPIRATORY_TRACT | Status: DC
  Administered 2016-05-14 (×2): 2.5 mg via RESPIRATORY_TRACT
  Filled 2016-05-13 (×2): qty 3

## 2016-05-13 MED ORDER — ROFLUMILAST 500 MCG PO TABS
500.0000 ug | ORAL_TABLET | Freq: Every day | ORAL | Status: DC
  Administered 2016-05-14 – 2016-05-28 (×15): 500 ug via ORAL
  Filled 2016-05-13 (×17): qty 1

## 2016-05-13 MED ORDER — ACETAZOLAMIDE SODIUM 500 MG IJ SOLR
500.0000 mg | Freq: Once | INTRAMUSCULAR | Status: AC
  Administered 2016-05-14: 500 mg via INTRAVENOUS

## 2016-05-13 MED ORDER — ADULT MULTIVITAMIN W/MINERALS CH
1.0000 | ORAL_TABLET | Freq: Every day | ORAL | Status: DC
  Administered 2016-05-14 – 2016-05-28 (×15): 1 via ORAL
  Filled 2016-05-13 (×16): qty 1

## 2016-05-13 MED ORDER — OXYBUTYNIN CHLORIDE 5 MG PO TABS
5.0000 mg | ORAL_TABLET | Freq: Every day | ORAL | Status: DC
  Administered 2016-05-14 – 2016-05-28 (×15): 5 mg via ORAL
  Filled 2016-05-13 (×16): qty 1

## 2016-05-13 MED ORDER — SODIUM CHLORIDE 0.9% FLUSH
3.0000 mL | Freq: Two times a day (BID) | INTRAVENOUS | Status: DC
  Administered 2016-05-14 – 2016-05-27 (×27): 3 mL via INTRAVENOUS

## 2016-05-13 MED ORDER — VANCOMYCIN HCL IN DEXTROSE 1-5 GM/200ML-% IV SOLN
1000.0000 mg | Freq: Once | INTRAVENOUS | Status: AC
  Administered 2016-05-13: 1000 mg via INTRAVENOUS
  Filled 2016-05-13: qty 200

## 2016-05-13 MED ORDER — LEVOFLOXACIN IN D5W 750 MG/150ML IV SOLN
750.0000 mg | INTRAVENOUS | Status: DC
  Administered 2016-05-14 – 2016-05-19 (×6): 750 mg via INTRAVENOUS
  Filled 2016-05-13 (×7): qty 150

## 2016-05-13 MED ORDER — ALBUTEROL SULFATE (2.5 MG/3ML) 0.083% IN NEBU
2.5000 mg | INHALATION_SOLUTION | RESPIRATORY_TRACT | Status: DC | PRN
  Administered 2016-05-23: 2.5 mg via RESPIRATORY_TRACT
  Filled 2016-05-13: qty 3

## 2016-05-13 MED ORDER — TIOTROPIUM BROMIDE MONOHYDRATE 2.5 MCG/ACT IN AERS: 1.0000 | INHALATION_SPRAY | Freq: Every day | RESPIRATORY_TRACT | Status: DC

## 2016-05-13 MED ORDER — ESCITALOPRAM OXALATE 10 MG PO TABS
20.0000 mg | ORAL_TABLET | Freq: Every day | ORAL | Status: DC
  Administered 2016-05-14 – 2016-05-28 (×15): 20 mg via ORAL
  Filled 2016-05-13 (×16): qty 2

## 2016-05-13 MED ORDER — ASPIRIN 81 MG PO CHEW
81.0000 mg | CHEWABLE_TABLET | Freq: Every day | ORAL | Status: DC
  Administered 2016-05-14 – 2016-05-28 (×15): 81 mg via ORAL
  Filled 2016-05-13 (×15): qty 1

## 2016-05-13 MED ORDER — ALBUTEROL (5 MG/ML) CONTINUOUS INHALATION SOLN
INHALATION_SOLUTION | RESPIRATORY_TRACT | Status: AC
  Filled 2016-05-13: qty 20

## 2016-05-13 MED ORDER — LEVOFLOXACIN IN D5W 750 MG/150ML IV SOLN
750.0000 mg | Freq: Once | INTRAVENOUS | Status: AC
  Administered 2016-05-13: 750 mg via INTRAVENOUS
  Filled 2016-05-13: qty 150

## 2016-05-13 MED ORDER — METHYLPREDNISOLONE SODIUM SUCC 125 MG IJ SOLR
125.0000 mg | Freq: Once | INTRAMUSCULAR | Status: AC
  Administered 2016-05-13: 125 mg via INTRAVENOUS
  Filled 2016-05-13: qty 2

## 2016-05-13 MED ORDER — ALBUTEROL (5 MG/ML) CONTINUOUS INHALATION SOLN
10.0000 mg/h | INHALATION_SOLUTION | RESPIRATORY_TRACT | Status: DC
  Administered 2016-05-13: 10 mg/h via RESPIRATORY_TRACT

## 2016-05-13 MED ORDER — TRIAMCINOLONE ACETONIDE 55 MCG/ACT NA AERO: 2.0000 | INHALATION_SPRAY | Freq: Every day | NASAL | Status: DC | PRN

## 2016-05-13 NOTE — ED Triage Notes (Signed)
Pt was seen by PCP last week and given steroids, antibx, and nebuilzer tx at home without relief. Increase in SOB, pt anxious. 4L [place] at all times at home.

## 2016-05-13 NOTE — H&P (Signed)
History and Physical    [name]:[id number] DOB: [birth year] DOA: 05/13/2016  PCP: Cassell Smiles., MD  Patient coming from: Home.    Chief Complaint:  SOB.   HPI:  [name] is an 61 y.o. female with hx of severe COPD, prior smoker, on 4L [place] home oxygen, hx of CAD, hx of SVT, HLD, presented to the ER with increase SOB.  She has been on chronic steroid, and  was found to have a marked leukocytosis with WBC of 18K.   Evaluation included a CXR showing no infiltrate, and her bicarb was elevated to 35's.  Her ABG showed 7.2/99/pOx=119 of 50%FIO2. She was placed on Bipap, and repeat ABG did not change significantly.  Consideration for intubation, however, she appeared well and had significant improvement, so it was deferred.  Hospitalist was asked to admit her for further evaluation and tx.  I remember her as I had taken care of her before.  She sees Dr Egbert Garibaldi of pulmonary outpatient, and she had seen Dr Blase Mess shortly during her last bout. We will bring her in for COPD exacerbation, though she was not in significant respiratory distress.  She does use her CPAP/Bipap every night at home.   ED Course:  See above.   Past Medical History:  Diagnosis Date  . COPD (chronic obstructive pulmonary disease) (HCC)    Home oxygen at [facility]  . Diverticulosis   . History of cardiac catheterization    No significant CAD 2005  . Hyperlipidemia   . Hypoglycemia   . IBS (irritable bowel syndrome)   . SVT (supraventricular tachycardia) (HCC)     Rewiew of Systems:  Constitutional: Negative for malaise, fever and chills. No significant weight loss or weight gain Eyes: Negative for eye pain, redness and discharge, diplopia, visual changes, or flashes of light. ENMT: Negative for ear pain, hoarseness, nasal congestion, sinus pressure and sore throat. No headaches; tinnitus, drooling, or problem swallowing. Cardiovascular: Negative for chest pain, palpitations, diaphoresis,  and peripheral edema. ; No  orthopnea, PND Respiratory: Negative for cough, hemoptysis, wheezing and stridor. No pleuritic chestpain. Gastrointestinal: Negative for nausea, vomiting, diarrhea, constipation, abdominal pain, melena, blood in stool, hematemesis, jaundice and rectal bleeding.    Genitourinary: Negative for frequency, dysuria, incontinence,flank pain and hematuria; Musculoskeletal: Negative for back pain and neck pain. Negative for swelling and trauma.;  Skin: . Negative for pruritus, rash, abrasions, bruising and skin lesion.; ulcerations Neuro: Negative for headache, lightheadedness and neck stiffness. Negative for weakness, altered level of consciousness , altered mental status, extremity weakness, burning feet, involuntary movement, seizure and syncope.  Psych: negative for anxiety, depression, insomnia, tearfulness, panic attacks, hallucinations, paranoia, suicidal or homicidal ideation.  Past Surgical History:  Procedure Laterality Date  . BACK SURGERY    . COLONOSCOPY  03/22/2011   Procedure: COLONOSCOPY;  Surgeon: Malissa Hippo, MD;  Location[facility];  Service: Endoscopy;  Laterality: N/A;  . SPINAL FUSION       reports that she quit smoking about 5 years ago. Her smoking use included Cigarettes. She has a 40.00 pack-year smoking history. She has never used smokeless tobacco. She reports that she drinks alcohol. She reports that she does not use drugs.  Allergies  Allergen Reactions  . Iohexol Anaphylaxis     Desc: IV DYE  ANAPHYLATIC SHOCK X2 ONCE AFTER PREMEDS   . Chantix [Varenicline] Other (See Comments)    PALPITATIONS  . Ketek [Telithromycin]   . Penicillins Other (See Comments)  unknown    Family History  Problem Relation Age of Onset  . Asthma Mother   . Heart attack Mother   . Diabetes Mother   . Heart failure Mother      Prior to Admission medications   Medication Sig Start Date End Date Taking? Authorizing Provider  albuterol (PROAIR HFA) 108 (90 BASE) MCG/ACT  inhaler Inhale 2 puffs into the lungs every 6 (six) hours as needed. For shortness of breath   Yes Historical Provider, MD  ALPRAZolam Prudy Feeler) 0.5 MG tablet Take 0.5 mg by mouth daily as needed. anxiety   Yes Historical Provider, MD  aspirin 81 MG tablet Take 81 mg by mouth daily.   Yes Historical Provider, MD  atorvastatin (LIPITOR) 40 MG tablet Take 1 tablet (40 mg total) by mouth daily. Patient taking differently: Take 40 mg by mouth every evening.  02/29/16  Yes Jonelle Sidle, MD  azithromycin [facility]) 250 MG tablet Take 250 mg by mouth every Monday, Wednesday, and Friday.  06/06/15  Yes Historical Provider, MD  cetirizine (ZYRTEC) 10 MG tablet Take 10 mg by mouth daily.     Yes Historical Provider, MD  clonazePAM (KLONOPIN) 0.5 MG tablet Take 0.5-0.75 mg by mouth at bedtime.  11/01/14  Yes Historical Provider, MD  diltiazem (CARDIZEM CD) 180 MG 24 hr capsule Take 1 capsule (180 mg total) by mouth daily. 02/29/16  Yes Jonelle Sidle, MD  escitalopram (LEXAPRO) 20 MG tablet Take 20 mg by mouth daily.     Yes Historical Provider, MD  fluconazole (DIFLUCAN) 100 MG tablet Take 1 tablet (100 mg total) by mouth daily. Patient taking differently: Take 100 mg by mouth every Monday at 6 PM.  11/19/14  Yes Costin Otelia Sergeant, MD  Fluticasone-Salmeterol (ADVAIR) 250-50 MCG/DOSE AEPB Inhale 1 puff into the lungs 2 (two) times daily.    Yes Historical Provider, MD  guaiFENesin (MUCINEX) 600 MG 12 hr tablet Take 1 tablet (600 mg total) by mouth 2 (two) times daily. 01/26/15  Yes Erick Blinks, MD  ibuprofen (ADVIL,MOTRIN) 200 MG tablet Take 400 mg by mouth 2 (two) times daily as needed for mild pain or moderate pain.   Yes Historical Provider, MD  ipratropium-albuterol (DUONEB) 0.5-2.5 (3) MG/3ML SOLN Take 3 mLs by nebulization 3 (three) times daily. 01/26/15  Yes Erick Blinks, MD  levofloxacin (LEVAQUIN) 750 MG tablet Take 750 mg by mouth daily. 05/07/16  Yes Historical Provider, MD  montelukast  (SINGULAIR) 10 MG tablet Take 1 tablet (10 mg total) by mouth at bedtime. 01/26/15  Yes Erick Blinks, MD  Multiple Vitamins-Minerals (MULTIVITAMINS THER. W/MINERALS) TABS Take 1 tablet by mouth daily.     Yes Historical Provider, MD  oxybutynin (DITROPAN) 5 MG tablet Take 5 mg by mouth daily.  08/28/13  Yes Historical Provider, MD  predniSONE (DELTASONE) 10 MG tablet Take 10 mg by mouth every morning.   Yes Historical Provider, MD  roflumilast (DALIRESP) 500 MCG TABS tablet Take 500 mcg by mouth daily.     Yes Historical Provider, MD  SPIRIVA RESPIMAT 2.5 MCG/ACT AERS Inhale 1 puff into the lungs daily.  10/17/14  Yes Historical Provider, MD  triamcinolone (NASACORT ALLERGY 24HR) 55 MCG/ACT AERO nasal inhaler Place 2 sprays into the nose daily as needed (FOR ALLERGIES).   Yes Historical Provider, MD  fluconazole (DIFLUCAN) 150 MG tablet Take 150 mg by mouth once a week.  08/21/12   Historical Provider, MD  predniSONE (DELTASONE) 10 MG tablet Take 60 mg x 3  days then 40 mg x 3 days then 30 mg x 3 days then 20mg  x 3 days then resume maintenance dose 10 mg daily Patient not taking: Reported on 05/13/2016 01/26/15   Erick Blinks, MD    Physical Exam: Vitals:   05/13/16 1951 05/13/16 2000 05/13/16 2030 05/13/16 2100  BP:  128/63 116/67 109/65  Pulse:  114 106 108  Resp:  20 24 23   Temp:      TempSrc:      SpO2: 95% 97% 97% 98%  Weight:      Height:          Constitutional: NAD, calm, comfortable Vitals:   05/13/16 1951 05/13/16 2000 05/13/16 2030 05/13/16 2100  BP:  128/63 116/67 109/65  Pulse:  114 106 108  Resp:  20 24 23   Temp:      TempSrc:      SpO2: 95% 97% 97% 98%  Weight:      Height:       Eyes: PERRL, lids and conjunctivae normal ENMT: Mucous membranes are moist. Posterior pharynx clear of any exudate or lesions.Normal dentition.  Neck: normal, supple, no masses, no thyromegaly Respiratory: clear to auscultation bilaterally, bialteral wheezing, no crackles. Normal  respiratory effort. No accessory muscle use.  Cardiovascular: Regular rate and rhythm, no murmurs / rubs / gallops. No extremity edema. 2+ pedal pulses. No carotid bruits.  Abdomen: no tenderness, no masses palpated. No hepatosplenomegaly. Bowel sounds positive.  Musculoskeletal: no clubbing / cyanosis. No joint deformity upper and lower extremities. Good ROM, no contractures. Normal muscle tone.  Skin: no rashes, lesions, ulcers. No induration Neurologic: CN 2-12 grossly intact. Sensation intact, DTR normal. Strength 5/5 in all 4.  Psychiatric: Normal judgment and insight. Alert and oriented x 3. Normal mood.     Labs on Admission: I have personally reviewed following labs and imaging studies  CBC:  Recent Labs Lab 05/13/16 1929  WBC 17.9*  NEUTROABS 15.3*  HGB 12.7  HCT 39.1  MCV 100.8*  PLT 363   Basic Metabolic Panel:  Recent Labs Lab 05/13/16 1929  NA 137  K 4.3  CL 94*  CO2 35*  GLUCOSE 192*  BUN 13  CREATININE 0.78  CALCIUM 8.9   GFR: Estimated Creatinine Clearance: 60.6 mL/min (by C-G formula based on SCr of 0.78 mg/dL). Liver Function Tests:  Recent Labs Lab 05/13/16 1929  AST 23  ALT 19  ALKPHOS 88  BILITOT 0.3  PROT 7.2  ALBUMIN 3.7   Cardiac Enzymes:  Recent Labs Lab 05/13/16 1929  TROPONINI <0.03   Urine analysis:    Component Value Date/Time   COLORURINE YELLOW 01/20/2015 1957   APPEARANCEUR CLEAR 01/20/2015 1957   LABSPEC 1.010 01/20/2015 1957   PHURINE 6.5 01/20/2015 1957   GLUCOSEU NEGATIVE 01/20/2015 1957   HGBUR MODERATE (A) 01/20/2015 1957   BILIRUBINUR NEGATIVE 01/20/2015 1957   KETONESUR NEGATIVE 01/20/2015 1957   PROTEINUR NEGATIVE 01/20/2015 1957   UROBILINOGEN 0.2 01/20/2015 1957   NITRITE NEGATIVE 01/20/2015 1957   LEUKOCYTESUR NEGATIVE 01/20/2015 1957   Radiological Exams on Admission: Dg Chest 1 View  Result Date: 05/13/2016 CLINICAL DATA:  Shortness of breath.  Ex-smoker. EXAM: CHEST 1 VIEW COMPARISON:   01/24/2015 FINDINGS: Two frontal radiographs. Midline trachea. Lower cervical spine fixation. Normal heart size. Age advanced aortic atherosclerosis. No definite pleural fluid. No pneumothorax. 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 Scattered lower lobe predominant granulomas bilaterally. Hazy opacities project over both lower lobes. At least partially felt to  be due to overlying breast tissue. Improved aeration on the right since the prior frontal radiograph of 11/14/2014. IMPRESSION: Bullous type emphysema. Increased density projecting over the lung bases is at least partially felt to be due to overlying breast tissue and AP portable technique. Probable concurrent scarring. Cannot exclude mild bibasilar pneumonia. Consider further evaluation with PA and lateral radiographs. Age advanced aortic atherosclerosis. Electronically Signed   By: Jeronimo Greaves M.D.   On: 05/13/2016 19:56    EKG: Independently reviewed.  Assessment/Plan Principal Problem:   Acute respiratory failure with hypercapnia (HCC) Active Problems:   Hyperlipidemia   Acute on chronic respiratory failure (HCC)   Chronic respiratory failure with hypoxia (HCC)   Leukocytosis    PLAN:   Will admit her for severe COPD with exacerbation.  She was given IV Van/IV Levoquin in the ER.  I will continue with IV Levaquin only.  Will give IV steroids, nebs. And give one dose of IV Diamox.  Will continue with Bipap and admit her to the SDU.    HLD:  Continue with meds.  Leukocytosis:  Suspect from steroid induced.      DVT prophylaxis: SUB Q levonox.  Code Status: FULL CODE>  Reconfirmed tonight.  Family Communication: None.  Disposition Plan: to home when appropriate.  Consults called: None.  Admission status: inpatient.    Miloh Alcocer MD FACP. [facility]  If 7PM-7AM, please contact night-coverage [email or web address] Password [email or web address]  05/13/2016, 10:09 PM

## 2016-05-13 NOTE — ED Notes (Signed)
CRITICAL VALUE ALERT  Critical value received:  PH-7.205, CO2-98.6, PO2-119, Bicarb-28.6, O2 Sats-96.3  Date of notification:  05/13/16  Time of notification:  2008 hrs  Critical value read back:Yes.    Nurse who received alert:  Y. Jamilex Bohnsack, RN  Responding MD:  Dr. Stark Jock  Time MD responded:  2011 hrs

## 2016-05-13 NOTE — ED Provider Notes (Signed)
[facility] DEPT Provider Note   CSN: [id number] Arrival date & time: 05/13/16  1919     History   Chief Complaint Chief Complaint  Patient presents with  . Shortness of Breath    HPI [name] is a 61 y.o. female.  Patient is a 61 year old female with past medical history of COPD. She is on home oxygen 4 L by nasal cannula. She presents today with increased shortness of breath, chest congestion and cough that has worsened over the past several days. She was seen by her primary doctor last week and started on steroids and antibiotics with little relief. Her breathing became much worse this evening and she called 911.   The history is provided by the patient.  Shortness of Breath  This is a recurrent problem. The average episode lasts 2 days. The problem occurs continuously.The problem has been rapidly worsening. Associated symptoms include cough and sputum production. Pertinent negatives include no fever, no chest pain and no leg swelling. She has tried nothing for the symptoms. Associated medical issues include asthma and COPD. Associated medical issues do not include heart failure or past MI.    Past Medical History:  Diagnosis Date  . COPD (chronic obstructive pulmonary disease) (HCC)    Home oxygen at [facility]  . Diverticulosis   . History of cardiac catheterization    No significant CAD 2005  . Hyperlipidemia   . Hypoglycemia   . IBS (irritable bowel syndrome)   . SVT (supraventricular tachycardia) [facility])     Patient Active Problem List   Diagnosis Date Noted  . Chronic respiratory failure with hypoxia ([place]) 01/21/2015  . Leukocytosis 01/21/2015  . Macrocytic anemia 01/21/2015  . CAP (community acquired pneumonia) 11/19/2014  . Acute on chronic respiratory failure ([place]) 11/19/2014  . Streptococcus pneumoniae pneumonia ([place]) 11/19/2014  . Thrush, oral 11/19/2014  . Respiratory failure with hypercapnia ([place]) 11/14/2014  . COPD exacerbation ([place]) 11/14/2014  .  Anemia 11/14/2014  . Tachycardia 11/14/2014  . Acute respiratory failure with hypercapnia ([place]) 11/14/2014  . Chest pain 09/08/2013  . Glucose intolerance (pre-diabetes) 05/24/2012  . COPD (chronic obstructive pulmonary disease) ([place]) 12/23/2011  . 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 Hyperlipidemia 12/23/2011  . Muscle spasms of neck 12/23/2011  . Diverticulitis 02/25/2011    Past Surgical History:  Procedure Laterality Date  . BACK SURGERY    . COLONOSCOPY  03/22/2011   Procedure: COLONOSCOPY;  Surgeon: Rogene Houston, MD;  Location: [facility];  Service: Endoscopy;  Laterality: N/A;  . SPINAL FUSION      OB History    No data available       Home Medications    Prior to Admission medications   Medication Sig Start Date End Date Taking? Authorizing Provider  albuterol (PROAIR HFA) 108 (90 BASE) MCG/ACT inhaler Inhale 2 puffs into the lungs every 6 (six) hours as needed. For shortness of breath    Historical Provider, MD  ALPRAZolam Duanne Moron) 0.5 MG tablet Take 0.5 mg by mouth daily as needed. anxiety    Historical Provider, MD  aspirin 81 MG tablet Take 81 mg by mouth daily.    Historical Provider, MD  atorvastatin (LIPITOR) 40 MG tablet Take 1 tablet (40 mg total) by mouth daily. 02/29/16   Satira Sark, MD  azithromycin (ZITHROMAX) 250 MG tablet  06/06/15   Historical Provider, MD  cetirizine (ZYRTEC) 10 MG tablet Take 10 mg by mouth daily.      Historical Provider, MD  clonazePAM (KLONOPIN) 0.5 MG tablet Take 1 mg by mouth at bedtime.  11/01/14   Historical Provider, MD  diltiazem (CARDIZEM CD) 180 MG 24 hr capsule Take 1 capsule (180 mg total) by mouth daily. 02/29/16   Satira Sark, MD  escitalopram (LEXAPRO) 20 MG tablet Take 20 mg by mouth daily.      Historical Provider, MD  fluconazole (DIFLUCAN) 100 MG tablet Take 1 tablet (100 mg total) by mouth daily. Patient taking differently: Take 100 mg by mouth every Monday at 6 PM.  11/19/14   Caren Griffins, MD    fluconazole (DIFLUCAN) 150 MG tablet Take 150 mg by mouth once a week.  08/21/12   Historical Provider, MD  Fluticasone-Salmeterol (ADVAIR) 250-50 MCG/DOSE AEPB Inhale 1 puff into the lungs 2 (two) times daily.     Historical Provider, MD  guaiFENesin (MUCINEX) 600 MG 12 hr tablet Take 1 tablet (600 mg total) by mouth 2 (two) times daily. 01/26/15   Kathie Dike, MD  ibuprofen (ADVIL,MOTRIN) 200 MG tablet Take 400 mg by mouth 2 (two) times daily as needed for mild pain or moderate pain.    Historical Provider, MD  ipratropium-albuterol (DUONEB) 0.5-2.5 (3) MG/3ML SOLN Take 3 mLs by nebulization 3 (three) times daily. 01/26/15   Kathie Dike, MD  montelukast (SINGULAIR) 10 MG tablet Take 1 tablet (10 mg total) by mouth at bedtime. 01/26/15   Kathie Dike, MD  Multiple Vitamins-Minerals (MULTIVITAMINS THER. W/MINERALS) TABS Take 1 tablet by mouth daily.      Historical Provider, MD  oxybutynin (DITROPAN) 5 MG tablet Take 5 mg by mouth daily.  08/28/13   Historical Provider, MD  predniSONE (DELTASONE) 10 MG tablet Take 60 mg x 3 days then 40 mg x 3 days then 30 mg x 3 days then 20mg  x 3 days then resume maintenance dose 10 mg daily 01/26/15   Kathie Dike, MD  roflumilast (DALIRESP) 500 MCG TABS tablet Take 500 mcg by mouth daily.      Historical Provider, MD  SPIRIVA RESPIMAT 2.5 MCG/ACT AERS Inhale 1 puff into the lungs daily.  10/17/14   Historical Provider, MD  triamcinolone (NASACORT ALLERGY 24HR) 55 MCG/ACT AERO nasal inhaler Place 2 sprays into the nose daily as needed (FOR ALLERGIES).    Historical Provider, MD    Family History Family History  Problem Relation Age of Onset  . Asthma Mother   . Heart attack Mother   . Diabetes Mother   . Heart failure Mother     Social History Social History  Substance Use Topics  . Smoking status: Former Smoker    Packs/day: 1.00    Years: 40.00    Types: Cigarettes    Quit date: 05/07/2011  . Smokeless tobacco: Never Used     Comment:  presently smoking about 2 a day  . Alcohol use 0.0 oz/week     Comment: Rare     Allergies   Iohexol; Chantix [varenicline]; Ketek [telithromycin]; and Penicillins   Review of Systems Review of Systems  Constitutional: Negative for fever.  Respiratory: Positive for cough, sputum production and shortness of breath.   Cardiovascular: Negative for chest pain and leg swelling.  All other systems reviewed and are negative.    Physical Exam Updated Vital Signs Temp 98.2 F (36.8 C) (Oral)   Ht 5\' 2"  (1.575 m)   Wt 119 lb (54 kg)   BMI 21.77 kg/m   Physical Exam  Constitutional: She is oriented to person, place, and  time. She appears well-developed and well-nourished. No distress.  HENT:  Head: Normocephalic and atraumatic.  Mouth/Throat: Oropharynx is clear and moist.  Neck: Normal range of motion. Neck supple.  Cardiovascular: Normal rate and regular rhythm.  Exam reveals no gallop and no friction rub.   No murmur heard. Pulmonary/Chest: She is in respiratory distress. She has wheezes.  Patient is in moderate respiratory distress. She is tachypneic and there are bilateral rales. There is decreased air movement throughout.  Abdominal: Soft. Bowel sounds are normal. She exhibits no distension. There is no tenderness.  Musculoskeletal: Normal range of motion. She exhibits no edema.  Neurological: She is alert and oriented to person, place, and time.  Skin: Skin is warm and dry. She is not diaphoretic.  Nursing note and vitals reviewed.    ED Treatments / Results  Labs (all labs ordered are listed, but only abnormal results are displayed) Labs Reviewed  BLOOD GAS, ARTERIAL  COMPREHENSIVE METABOLIC PANEL  BRAIN NATRIURETIC PEPTIDE  CBC WITH DIFFERENTIAL/PLATELET  TROPONIN I    EKG  EKG Interpretation  Date/Time:  Monday May 13 2016 19:32:27 EST Ventricular Rate:  128 PR Interval:    QRS Duration: 86 QT Interval:  288 QTC Calculation: 421 R Axis:   92 Text  Interpretation:  Sinus tachycardia Borderline right axis deviation Confirmed by Elaine Middleton  MD, Shaniyah Wix ([id number]) on 05/13/2016 8:03:55 PM       Radiology No results found.  Procedures Procedures (including critical care time)  Medications Ordered in ED Medications - No data to display   Initial Impression / Assessment and Plan / ED Course  I have reviewed the triage vital signs and the nursing notes.  Pertinent labs & imaging results that were available during my care of the patient were reviewed by me and considered in my medical decision making (see chart for details).  Clinical Course     Patient with history of COPD presents with complaints of worsening breathing over the past several days. She was requesting BiPAP upon arrival. She was placed on BiPAP and given an hour-long neb along with a dose of steroids. Her initial ABG revealed a pH of 7.2 with a PCO2 of 99. An hour later, there was little improvement, however she is clinically feeling much better.  Her x-ray reveals the suggestion of bibasilar infiltrates and will be treated for community-acquired pneumonia with IV antibiotics. She will require admission. I've discussed this with Dr. Marin Comment on the hospitalist service who agrees to admit.  CRITICAL CARE Performed by: Veryl Speak Total critical care time: 45 minutes Critical care time was exclusive of separately billable procedures and treating other patients. Critical care was necessary to treat or prevent imminent or life-threatening deterioration. Critical care was time spent personally by me on the following activities: development of treatment plan with patient and/or surrogate as well as nursing, discussions with consultants, evaluation of patient's response to treatment, examination of patient, obtaining history from patient or surrogate, ordering and performing treatments and interventions, ordering and review of laboratory studies, ordering and review of radiographic studies,  pulse oximetry and re-evaluation of patient's condition.   Final Clinical Impressions(s) / ED Diagnoses   Final diagnoses:  None    New Prescriptions New Prescriptions   No medications on file     Veryl Speak, MD 05/13/16 2142

## 2016-05-13 NOTE — ED Notes (Signed)
CRITICAL VALUE ALERT  Critical value received:  ABG PH 7.192 pCO2 101 PO2 63.1 Bicarb 28.7 Acid Excess 9.2   Date of notification:  05/13/16  Time of notification:  2128  Critical value read back:yes  Nurse who received alert:  Tilden Fossa  MD notified (1st page):  MD Delo  Time of first page:  2129  MD notified (2nd page):  Time of second page:  Responding MD:  Stark Jock  Time MD responded:  2129

## 2016-05-14 DIAGNOSIS — J441 Chronic obstructive pulmonary disease with (acute) exacerbation: Secondary | ICD-10-CM

## 2016-05-14 DIAGNOSIS — J9621 Acute and chronic respiratory failure with hypoxia: Principal | ICD-10-CM

## 2016-05-14 DIAGNOSIS — D72823 Leukemoid reaction: Secondary | ICD-10-CM

## 2016-05-14 DIAGNOSIS — J9622 Acute and chronic respiratory failure with hypercapnia: Secondary | ICD-10-CM

## 2016-05-14 LAB — BASIC METABOLIC PANEL
ANION GAP: 6 (ref 5–15)
BUN: 12 mg/dL (ref 6–20)
CO2: 38 mmol/L — AB (ref 22–32)
Calcium: 8.7 mg/dL — ABNORMAL LOW (ref 8.9–10.3)
Chloride: 95 mmol/L — ABNORMAL LOW (ref 101–111)
Creatinine, Ser: 0.69 mg/dL (ref 0.44–1.00)
GFR calc Af Amer: >60 mL/min (ref >60)
GLUCOSE: 143 mg/dL — AB (ref 65–99)
POTASSIUM: 4.4 mmol/L (ref 3.5–5.1)
Sodium: 139 mmol/L (ref 135–145)

## 2016-05-14 LAB — MRSA PCR SCREENING: MRSA by PCR: NEGATIVE

## 2016-05-14 MED ORDER — ACETYLCYSTEINE 20 % IN SOLN
4.0000 mL | RESPIRATORY_TRACT | Status: DC
  Administered 2016-05-14 – 2016-05-19 (×30): 4 mL via RESPIRATORY_TRACT
  Filled 2016-05-14 (×36): qty 4

## 2016-05-14 MED ORDER — TIOTROPIUM BROMIDE MONOHYDRATE 18 MCG IN CAPS
18.0000 ug | ORAL_CAPSULE | Freq: Every day | RESPIRATORY_TRACT | Status: DC
  Filled 2016-05-14: qty 5

## 2016-05-14 MED ORDER — ALPRAZOLAM 0.5 MG PO TABS
0.5000 mg | ORAL_TABLET | Freq: Three times a day (TID) | ORAL | Status: DC | PRN
  Administered 2016-05-14 – 2016-05-23 (×15): 0.5 mg via ORAL
  Filled 2016-05-14 (×17): qty 1

## 2016-05-14 MED ORDER — ACETAZOLAMIDE SODIUM 500 MG IJ SOLR
INTRAMUSCULAR | Status: AC
  Filled 2016-05-14: qty 500

## 2016-05-14 MED ORDER — HYDROCOD POLST-CPM POLST ER 10-8 MG/5ML PO SUER
5.0000 mL | Freq: Once | ORAL | Status: DC
  Filled 2016-05-14: qty 5

## 2016-05-14 MED ORDER — TIOTROPIUM BROMIDE MONOHYDRATE 18 MCG IN CAPS: 18.0000 ug | ORAL_CAPSULE | Freq: Every day | RESPIRATORY_TRACT | Status: DC

## 2016-05-14 MED ORDER — IPRATROPIUM-ALBUTEROL 0.5-2.5 (3) MG/3ML IN SOLN: 3.0000 mL | Freq: Four times a day (QID) | RESPIRATORY_TRACT | Status: DC

## 2016-05-14 MED ORDER — FLUTICASONE PROPIONATE 50 MCG/ACT NA SUSP
2.0000 | Freq: Every day | NASAL | Status: DC | PRN
  Filled 2016-05-14: qty 16

## 2016-05-14 MED ORDER — ACETAMINOPHEN 325 MG PO TABS
650.0000 mg | ORAL_TABLET | ORAL | Status: DC | PRN
  Administered 2016-05-15 – 2016-05-27 (×6): 650 mg via ORAL
  Filled 2016-05-14 (×6): qty 2

## 2016-05-14 MED ORDER — IPRATROPIUM-ALBUTEROL 0.5-2.5 (3) MG/3ML IN SOLN
3.0000 mL | RESPIRATORY_TRACT | Status: DC
  Administered 2016-05-14 – 2016-05-22 (×49): 3 mL via RESPIRATORY_TRACT
  Filled 2016-05-14 (×50): qty 3

## 2016-05-14 NOTE — ED Notes (Signed)
Pt given meal tray at this time 

## 2016-05-14 NOTE — ED Notes (Signed)
Vanessa, respiratory at bedside.

## 2016-05-14 NOTE — Progress Notes (Signed)
PROGRESS NOTE    [name]  OZH:[id number] DOB: 11-Nov-1955 DOA: 05/13/2016 PCP: Cassell Smiles., MD   Brief Narrative: 61 y.o. female with hx of severe COPD, prior smoker, on 4L [place] home oxygen, hx of CAD, hx of SVT, HLD, presented to the ER with increase SOB.  She has been on chronic steroid, and  was found to have a marked leukocytosis with WBC of 18K. Evaluation included a CXR showing no infiltrate, and her bicarb was elevated to 35's.  Her ABG showed 7.2/99/pOx=119 of 50%FIO2. She was placed on Bipap.  Assessment & Plan:   Principal Problem:   Acute respiratory failure with hypercapnia (HCC) Active Problems:   Hyperlipidemia   Acute on chronic respiratory failure (HCC)   Chronic respiratory failure with hypoxia (HCC)   Leukocytosis  #Acute COPD exacerbation: -Chest x-ray with chronic COPD change. -Start DuoNeb 4 nebulization. -Continue Solu-Medrol, daliresp and Levaquin -Also added Mucomyst nebulization -Check respiratory viral studies., Droplet precaution  #Acute on chronic hypoxic and hypercapnic respiratory failure due to COPD exacerbation: Patient required BiPAP on admission. Try to wean to nasal cannula. She uses about 4 L of oxygen at home. Continue bronchodilator and steroid as discussed above. -I discussed with the respiratory therapist this morning. -Patient with respiratory acidosis and elevated CO2 on admission. Follow-up blood gas.  #Hypertension: Continue diltiazem  #Leukocytosis suspected from a steroid and a stress: WBC count trending down. Monitor CBC.  #Anxiety and dyspnea: Recent reported that she takes Xanax as needed with Klonopin for air hunger and dyspnea. Resumed in the hospital.  DVT prophylaxis: Lovenox subcutaneous Code Status: Full code Family Communication: No family present at bedside Disposition Plan: Admitted in stepdown unit for  BiPAP use    Consultants:   None  Procedures: BiPAP Antimicrobials:  Levaquin  Subjective: Patient was seen and examined at bedside. Patient reported somewhat better today. Still having shortness of breath and dry cough. Feels anxious. Denied fever, chills, chest pain, nausea or vomiting.  Objective: Vitals:   05/14/16 1530 05/14/16 1600 05/14/16 1615 05/14/16 1630  BP: 122/73   121/67  Pulse: 89 91  91  Resp: 22 18  23   Temp:      TempSrc:      SpO2: 95% 96% 97%   Weight:      Height:        Intake/Output Summary (Last 24 hours) at 05/14/16 1713 Last data filed at 05/14/16 0923  Gross per 24 hour  Intake                3 ml  Output                0 ml  Net                3 ml   Filed Weights   05/13/16 1922  Weight: 54 kg (119 lb)    Examination:  General exam: Not in distress, speaking full sentences. Off BiPAP this morning Respiratory system: Bilateral diffuse rhonchi with expiratory wheeze. Cardiovascular system: S1 & S2 heard, RRR.  No pedal edema. Gastrointestinal system: Abdomen is nondistended, soft and nontender. Normal bowel sounds heard. Central nervous system: Alert and oriented. No focal neurological deficits. Extremities: Symmetric 5 x 5 power. Skin: No rashes, lesions or ulcers Psychiatry: Judgement and insight appear normal. Mood & affect appropriate.     Data Reviewed: I have personally reviewed following labs and imaging studies  CBC:  Recent Labs Lab 05/13/16 1929  WBC 17.9*  NEUTROABS 15.3*  HGB 12.7  HCT 39.1  MCV 100.8*  PLT 363   Basic Metabolic Panel:  Recent Labs Lab 05/13/16 1929 05/14/16 0540  NA 137 139  K 4.3 4.4  CL 94* 95*  CO2 35* 38*  GLUCOSE 192* 143*  BUN 13 12  CREATININE 0.78 0.69  CALCIUM 8.9 8.7*   GFR: Estimated Creatinine Clearance: 60.6 mL/min (by C-G formula based on SCr of 0.69 mg/dL). Liver Function Tests:  Recent Labs Lab 05/13/16 1929  AST 23  ALT 19  ALKPHOS 88  BILITOT 0.3  PROT 7.2  ALBUMIN 3.7   No results for input(s): LIPASE, AMYLASE in the last  168 hours. No results for input(s): AMMONIA in the last 168 hours. Coagulation Profile: No results for input(s): INR, PROTIME in the last 168 hours. Cardiac Enzymes:  Recent Labs Lab 05/13/16 1929  TROPONINI <0.03   BNP (last 3 results) No results for input(s): PROBNP in the last 8760 hours. HbA1C: No results for input(s): HGBA1C in the last 72 hours. CBG: No results for input(s): GLUCAP in the last 168 hours. Lipid Profile: No results for input(s): CHOL, HDL, LDLCALC, TRIG, CHOLHDL, LDLDIRECT in the last 72 hours. Thyroid Function Tests:  Recent Labs  05/13/16 1929  TSH 1.140   Anemia Panel: No results for input(s): VITAMINB12, FOLATE, FERRITIN, TIBC, IRON, RETICCTPCT in the last 72 hours. Sepsis Labs: No results for input(s): PROCALCITON, LATICACIDVEN in the last 168 hours.  No results found for this or any previous visit (from the past 240 hour(s)).       Radiology Studies: Dg Chest 1 View  Result Date: 05/13/2016 CLINICAL DATA:  Shortness of breath.  Ex-smoker. EXAM: CHEST 1 VIEW COMPARISON:  01/24/2015 FINDINGS: Two frontal radiographs. Midline trachea. Lower cervical spine fixation. Normal heart size. Age advanced aortic atherosclerosis. No definite pleural fluid. No pneumothorax. 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 Scattered lower lobe predominant granulomas bilaterally. Hazy opacities project over both lower lobes. At least partially felt to be due to overlying breast tissue. Improved aeration on the right since the prior frontal radiograph of 11/14/2014. IMPRESSION: Bullous type emphysema. Increased density projecting over the lung bases is at least partially felt to be due to overlying breast tissue and AP portable technique. Probable concurrent scarring. Cannot exclude mild bibasilar pneumonia. Consider further evaluation with PA and lateral radiographs. Age advanced aortic atherosclerosis. Electronically Signed   By: Jeronimo Greaves M.D.   On: 05/13/2016  19:56        Scheduled Meds: . acetylcysteine  4 mL Nebulization Q4H  . aspirin  81 mg Oral Daily  . atorvastatin  40 mg Oral QPM  . clonazePAM  0.5-0.75 mg Oral QHS  . diltiazem  180 mg Oral Daily  . enoxaparin (LOVENOX) injection  40 mg Subcutaneous Q24H  . escitalopram  20 mg Oral Daily  . [START ON 05/20/2016] fluconazole  100 mg Oral Q Mon-1800  . ipratropium-albuterol  3 mL Nebulization Q4H  . methylPREDNISolone (SOLU-MEDROL) injection  40 mg Intravenous Q6H  . multivitamin with minerals  1 tablet Oral Daily  . oxybutynin  5 mg Oral Daily  . roflumilast  500 mcg Oral Daily  . sodium chloride flush  3 mL Intravenous Q12H   Continuous Infusions: . albuterol Stopped (05/13/16 2141)  . levofloxacin (LEVAQUIN) IV       LOS: 1 day    Dron Jaynie Collins, MD [facility] Pager [phone]  If 7PM-7AM, please contact night-coverage [email or web address] Password [email or web address]  05/14/2016, 5:13 PM

## 2016-05-14 NOTE — ED Notes (Signed)
Attempted report x1. 

## 2016-05-14 NOTE — ED Notes (Signed)
Pt placed on bedpan at this time.

## 2016-05-14 NOTE — ED Notes (Addendum)
Vanessa with respiratory at bedside.

## 2016-05-14 NOTE — ED Notes (Signed)
Pt placed on bed pan and taken off at this time, skin cleaned and dried.

## 2016-05-14 NOTE — Progress Notes (Signed)
Attempted to call back RN for report.

## 2016-05-14 NOTE — ED Notes (Signed)
Admitting MD at bedside at this time.  Dr. Carolin Sicks states pt can eat food.

## 2016-05-14 NOTE — Progress Notes (Signed)
Report received from [facility].

## 2016-05-14 NOTE — Progress Notes (Signed)
Respiratory therapist gave patient 4 saltine crackers and diet coke before RN administered oral medication.

## 2016-05-14 NOTE — ED Notes (Signed)
Patient pushed call bell, then started yelling help help. Upon entering room patient had accidentally  disconnected tubing from mask leading to Bi-Pap machine. Patient 02 sat was 86 percent. Connected tubing back to mask, patient 02 back at 95 percent.

## 2016-05-14 NOTE — ED Notes (Signed)
Pt taken off bedpan, skin cleaned and dried.  

## 2016-05-14 NOTE — ED Notes (Signed)
Respiratory at bedside.

## 2016-05-14 NOTE — ED Notes (Signed)
Pt placed on bedpan to urinate and taken off, skin cleaned and dried.

## 2016-05-15 ENCOUNTER — Inpatient Hospital Stay (HOSPITAL_COMMUNITY): Payer: Medicare Other

## 2016-05-15 DIAGNOSIS — J9602 Acute respiratory failure with hypercapnia: Secondary | ICD-10-CM

## 2016-05-15 LAB — BLOOD GAS, ARTERIAL
Acid-Base Excess: 15.8 mmol/L — ABNORMAL HIGH (ref 0.0–2.0)
Bicarbonate: 37 mmol/L — ABNORMAL HIGH (ref 20.0–28.0)
Delivery systems: POSITIVE
Drawn by: 234301
Expiratory PAP: 7
FIO2: 40
INSPIRATORY PAP: 14
MODE: POSITIVE
O2 Saturation: 97.5 %
PO2 ART: 117 mmHg — AB (ref 83.0–108.0)
pCO2 arterial: 84.6 mmHg (ref 32.0–48.0)
pH, Arterial: 7.321 — ABNORMAL LOW (ref 7.350–7.450)

## 2016-05-15 LAB — CBC
HEMATOCRIT: 38.7 % (ref 36.0–46.0)
Hemoglobin: 11.8 g/dL — ABNORMAL LOW (ref 12.0–15.0)
MCH: 31.6 pg (ref 26.0–34.0)
MCHC: 30.5 g/dL (ref 30.0–36.0)
MCV: 103.8 fL — AB (ref 78.0–100.0)
Platelets: 366 10*3/uL (ref 150–400)
RBC: 3.73 MIL/uL — ABNORMAL LOW (ref 3.87–5.11)
RDW: 12.1 % (ref 11.5–15.5)
WBC: 16.3 10*3/uL — ABNORMAL HIGH (ref 4.0–10.5)

## 2016-05-15 LAB — BASIC METABOLIC PANEL
Anion gap: 7 (ref 5–15)
BUN: 17 mg/dL (ref 6–20)
CHLORIDE: 93 mmol/L — AB (ref 101–111)
CO2: 40 mmol/L — ABNORMAL HIGH (ref 22–32)
Calcium: 8.6 mg/dL — ABNORMAL LOW (ref 8.9–10.3)
Creatinine, Ser: 0.64 mg/dL (ref 0.44–1.00)
GFR calc Af Amer: >60 mL/min (ref >60)
GFR calc non Af Amer: >60 mL/min (ref >60)
GLUCOSE: 152 mg/dL — AB (ref 65–99)
POTASSIUM: 3.9 mmol/L (ref 3.5–5.1)
Sodium: 140 mmol/L (ref 135–145)

## 2016-05-15 LAB — GLUCOSE, CAPILLARY: GLUCOSE-CAPILLARY: 158 mg/dL — AB (ref 65–99)

## 2016-05-15 MED ORDER — METHYLPREDNISOLONE SODIUM SUCC 125 MG IJ SOLR
60.0000 mg | Freq: Three times a day (TID) | INTRAMUSCULAR | Status: DC
  Administered 2016-05-15 – 2016-05-19 (×15): 60 mg via INTRAVENOUS
  Filled 2016-05-15 (×15): qty 2

## 2016-05-15 MED ORDER — ORAL CARE MOUTH RINSE
15.0000 mL | Freq: Two times a day (BID) | OROMUCOSAL | Status: DC
  Administered 2016-05-17 – 2016-05-28 (×22): 15 mL via OROMUCOSAL

## 2016-05-15 NOTE — Consult Note (Signed)
Consult requested by: [facility] Consult requested for COPD exacerbation acute on chronic respiratory failure:  HPI: This is a 61 year old with known severe COPD. She is on 4 L nasal cannula at home on BiPAP at home on chronic steroids. She says she was exposed to sick contacts over the holidays. She's been on BiPAP essentially since admission. Last blood gas showed no significant improvement. Chest x-ray which I personally reviewed I think shows some infiltrate. This is read as perhaps being breast attenuation but considering her clinical situation I think there may be some infiltrate and she is being appropriately treated for that anyway. She is able to talk to me with the BiPAP on although she's difficult to understand. She says she feels better. She uses BiPAP at night and she uses it during the day when she naps. She has been coughing nonproductively. Perhaps some fever. No chills. No chest pain except with cough. She has history of IBS but is not having diarrhea now. No abdominal pain. No nausea or vomiting. No urinary symptoms. No headache.  Past Medical History:  Diagnosis Date  . COPD (chronic obstructive pulmonary disease) (HCC)    Home oxygen at [facility]  . Diverticulosis   . History of cardiac catheterization    No significant CAD 2005  . Hyperlipidemia   . Hypoglycemia   . IBS (irritable bowel syndrome)   . SVT (supraventricular tachycardia) (HCC)      Family History  Problem Relation Age of Onset  . Asthma Mother   . Heart attack Mother   . Diabetes Mother   . Heart failure Mother      Social History   Social History  . Marital status: Divorced    Spouse name: N/A  . Number of children: N/A  . Years of education: N/A   Social History Main Topics  . Smoking status: Former Smoker    Packs/day: 1.00    Years: 40.00    Types: Cigarettes    Quit date: 05/07/2011  . Smokeless tobacco: Never Used     Comment: presently smoking about 2 a day  . Alcohol use 0.0  oz/week     Comment: Rare  . Drug use: No  . Sexual activity: No   Other Topics Concern  . None   Social History Narrative  . None     ROS: Except as mentioned with the limits of the BiPAP 10 point review of systems is negative   Objective: Vital signs in last 24 hours: Temp:  [97.5 F (36.4 C)-98.2 F (36.8 C)] 97.9 F (36.6 C) (01/10 0400) Pulse Rate:  [83-101] 86 (01/10 0320) Resp:  [15-29] 25 (01/10 0320) BP: (105-154)/(59-85) 105/64 (01/09 1700) SpO2:  [93 %-100 %] 95 % (01/10 0320) FiO2 (%):  [40 %] 40 % (01/10 0400) Weight change:  Last BM Date: 05/13/16  Intake/Output from previous day: 01/09 0701 - 01/10 0700 In: 633 [P.O.:480; I.V.:3; IV Piggyback:150] Out: 125 [Urine:125]  PHYSICAL EXAM Constitutional: She is awake and alert on BiPAP in no acute distress. Eyes: Pupils react. EOMI. Ears nose mouth and throat: I can't assess her oral mucosa very well. Hearing is grossly normal. Cardiovascular: Her heart is regular with somewhat distant heart sounds. No edema. Respiratory: Respiratory effort is increased. Lungs show prolonged expiratory phase. Gastrointestinal: Her abdomen is soft with no masses. Musculoskeletal: She is able to move all 4 extremities freely strength is grossly normal. Neurological: No focal abnormalities psychiatric: Normal mood and affect skin: Warm and dry  Lab  Results: Basic Metabolic Panel:  Recent Labs  05/14/16 0540 05/15/16 0433  NA 139 140  K 4.4 3.9  CL 95* 93*  CO2 38* 40*  GLUCOSE 143* 152*  BUN 12 17  CREATININE 0.69 0.64  CALCIUM 8.7* 8.6*   Liver Function Tests:  Recent Labs  05/13/16 1929  AST 23  ALT 19  ALKPHOS 88  BILITOT 0.3  PROT 7.2  ALBUMIN 3.7   No results for input(s): LIPASE, AMYLASE in the last 72 hours. No results for input(s): AMMONIA in the last 72 hours. CBC:  Recent Labs  05/13/16 1929 05/15/16 0433  WBC 17.9* 16.3*  NEUTROABS 15.3*  --   HGB 12.7 11.8*  HCT 39.1 38.7  MCV 100.8*  103.8*  PLT 363 366   Cardiac Enzymes:  Recent Labs  05/13/16 1929  TROPONINI <0.03   BNP: No results for input(s): PROBNP in the last 72 hours. D-Dimer: No results for input(s): DDIMER in the last 72 hours. CBG: No results for input(s): GLUCAP in the last 72 hours. Hemoglobin A1C: No results for input(s): HGBA1C in the last 72 hours. Fasting Lipid Panel: No results for input(s): CHOL, HDL, LDLCALC, TRIG, CHOLHDL, LDLDIRECT in the last 72 hours. Thyroid Function Tests:  Recent Labs  05/13/16 1929  TSH 1.140   Anemia Panel: No results for input(s): VITAMINB12, FOLATE, FERRITIN, TIBC, IRON, RETICCTPCT in the last 72 hours. Coagulation: No results for input(s): LABPROT, INR in the last 72 hours. Urine Drug Screen: Drugs of Abuse  No results found for: LABOPIA, COCAINSCRNUR, LABBENZ, AMPHETMU, THCU, LABBARB  Alcohol Level: No results for input(s): ETH in the last 72 hours. Urinalysis: No results for input(s): COLORURINE, LABSPEC, PHURINE, GLUCOSEU, HGBUR, BILIRUBINUR, KETONESUR, PROTEINUR, UROBILINOGEN, NITRITE, LEUKOCYTESUR in the last 72 hours.  Invalid input(s): APPERANCEUR Misc. Labs:   ABGS:  Recent Labs  05/13/16 2120  PHART 7.192*  PO2ART 63.1*  HCO3 28.7*     MICROBIOLOGY: Recent Results (from the past 240 hour(s))  MRSA PCR Screening     Status: None   Collection Time: 05/14/16  5:40 PM  Result Value Ref Range Status   MRSA by PCR NEGATIVE NEGATIVE Final    Comment:        The GeneXpert MRSA Assay (FDA approved for NASAL specimens only), is one component of a comprehensive MRSA colonization surveillance program. It is not intended to diagnose MRSA infection nor to guide or monitor treatment for MRSA infections.     Studies/Results: Dg Chest 1 View  Result Date: 05/13/2016 CLINICAL DATA:  Shortness of breath.  Ex-smoker. EXAM: CHEST 1 VIEW COMPARISON:  01/24/2015 FINDINGS: Two frontal radiographs. Midline trachea. Lower cervical spine  fixation. Normal heart size. Age advanced aortic atherosclerosis. No definite pleural fluid. No pneumothorax. 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 Scattered lower lobe predominant granulomas bilaterally. Hazy opacities project over both lower lobes. At least partially felt to be due to overlying breast tissue. Improved aeration on the right since the prior frontal radiograph of 11/14/2014. IMPRESSION: Bullous type emphysema. Increased density projecting over the lung bases is at least partially felt to be due to overlying breast tissue and AP portable technique. Probable concurrent scarring. Cannot exclude mild bibasilar pneumonia. Consider further evaluation with PA and lateral radiographs. Age advanced aortic atherosclerosis. Electronically Signed   By: Abigail Miyamoto M.D.   On: 05/13/2016 19:56    Medications:  Prior to Admission:  Prescriptions Prior to Admission  Medication Sig Dispense Refill Last Dose  . albuterol (  PROAIR HFA) 108 (90 BASE) MCG/ACT inhaler Inhale 2 puffs into the lungs every 6 (six) hours as needed. For shortness of breath   05/13/2016 at Unknown time  . ALPRAZolam (XANAX) 0.5 MG tablet Take 0.5 mg by mouth daily as needed. anxiety   05/13/2016 at Unknown time  . aspirin 81 MG tablet Take 81 mg by mouth daily.   05/13/2016 at Unknown time  . atorvastatin (LIPITOR) 40 MG tablet Take 1 tablet (40 mg total) by mouth daily. (Patient taking differently: Take 40 mg by mouth every evening. ) 90 tablet 3 05/12/2016 at Unknown time  . azithromycin (ZITHROMAX) 250 MG tablet Take 250 mg by mouth every Monday, Wednesday, and Friday.    05/10/2016  . cetirizine (ZYRTEC) 10 MG tablet Take 10 mg by mouth daily.     05/13/2016 at Unknown time  . clonazePAM (KLONOPIN) 0.5 MG tablet Take 0.5-0.75 mg by mouth at bedtime.    05/12/2016 at Unknown time  . diltiazem (CARDIZEM CD) 180 MG 24 hr capsule Take 1 capsule (180 mg total) by mouth daily. 90 capsule 3 05/13/2016 at Unknown time  . escitalopram  (LEXAPRO) 20 MG tablet Take 20 mg by mouth daily.     05/13/2016 at Unknown time  . fluconazole (DIFLUCAN) 150 MG tablet Take 150 mg by mouth once a week.    05/13/2016 at Unknown time  . Fluticasone-Salmeterol (ADVAIR) 250-50 MCG/DOSE AEPB Inhale 1 puff into the lungs 2 (two) times daily.    05/13/2016 at Unknown time  . guaiFENesin (MUCINEX) 600 MG 12 hr tablet Take 1 tablet (600 mg total) by mouth 2 (two) times daily. 20 tablet 0 05/13/2016 at Unknown time  . ibuprofen (ADVIL,MOTRIN) 200 MG tablet Take 400 mg by mouth 2 (two) times daily as needed for mild pain or moderate pain.   05/13/2016 at Unknown time  . ipratropium-albuterol (DUONEB) 0.5-2.5 (3) MG/3ML SOLN Take 3 mLs by nebulization 3 (three) times daily. 360 mL 1 05/13/2016 at Unknown time  . levofloxacin (LEVAQUIN) 750 MG tablet Take 750 mg by mouth daily.   05/13/2016 at Unknown time  . montelukast (SINGULAIR) 10 MG tablet Take 1 tablet (10 mg total) by mouth at bedtime. 30 tablet 1 05/12/2016 at Unknown time  . Multiple Vitamins-Minerals (MULTIVITAMINS THER. W/MINERALS) TABS Take 1 tablet by mouth daily.     05/13/2016 at Unknown time  . oxybutynin (DITROPAN) 5 MG tablet Take 5 mg by mouth daily.    05/13/2016 at Unknown time  . predniSONE (DELTASONE) 10 MG tablet Take 10 mg by mouth every morning.   HOLD  . roflumilast (DALIRESP) 500 MCG TABS tablet Take 500 mcg by mouth daily.     05/13/2016 at Unknown time  . SPIRIVA RESPIMAT 2.5 MCG/ACT AERS Inhale 1 puff into the lungs daily.    05/13/2016 at Unknown time  . triamcinolone (NASACORT ALLERGY 24HR) 55 MCG/ACT AERO nasal inhaler Place 2 sprays into the nose daily as needed (FOR ALLERGIES).   UNKNOWN  . predniSONE (DELTASONE) 10 MG tablet Take 60 mg x 3 days then 40 mg x 3 days then 30 mg x 3 days then 20mg  x 3 days then resume maintenance dose 10 mg daily (Patient not taking: Reported on 05/13/2016) 60 tablet 2 Not Taking at Unknown time   Scheduled: . acetylcysteine  4 mL Nebulization Q4H  . aspirin  81 mg  Oral Daily  . atorvastatin  40 mg Oral QPM  . chlorpheniramine-HYDROcodone  5 mL Oral Once  .  clonazePAM  0.5-0.75 mg Oral QHS  . diltiazem  180 mg Oral Daily  . enoxaparin (LOVENOX) injection  40 mg Subcutaneous Q24H  . escitalopram  20 mg Oral Daily  . [START ON 05/20/2016] fluconazole  100 mg Oral Q Mon-1800  . ipratropium-albuterol  3 mL Nebulization Q4H  . levofloxacin (LEVAQUIN) IV  750 mg Intravenous Q24H  . methylPREDNISolone (SOLU-MEDROL) injection  40 mg Intravenous Q6H  . multivitamin with minerals  1 tablet Oral Daily  . oxybutynin  5 mg Oral Daily  . roflumilast  500 mcg Oral Daily  . sodium chloride flush  3 mL Intravenous Q12H   Continuous: . albuterol Stopped (05/13/16 2141)   [id number], albuterol, ALPRAZolam, fluticasone  Assesment:She is admitted with acute on chronic respiratory failure with increased PCO2 and pH Around 7.2. Blood gas last night showed essentially no improvement. She is on IV steroids, IV antibiotics. Inhaled bronchodilators and not really much to add at this point.  Principal Problem:   Acute respiratory failure with hypercapnia (HCC) Active Problems:   Hyperlipidemia   Acute on chronic respiratory failure (HCC)   Chronic respiratory failure with hypoxia (HCC)   Leukocytosis    Plan:Repeat blood gas. Repeat chest x-ray. Continue other treatments. Flutter valve when she is clinically able to use it   LOS: 2 days   Vihan Santagata L 05/15/2016, 7:26 AM

## 2016-05-15 NOTE — Progress Notes (Signed)
ABG results reported to MD Hawkins via telephone. MD Candiss Norse paged with results

## 2016-05-15 NOTE — Progress Notes (Signed)
PROGRESS NOTE                                                                                                                                                                                                             Patient Demographics:    [name], is a 61 y.o. female, DOB - 09-Dec-1955, UYQ:[id number]  Admit date - 05/13/2016   Admitting Physician Houston Siren, MD  Outpatient Primary MD for the patient is Cassell Smiles., MD  LOS - 2  Chief Complaint  Patient presents with  . Shortness of Breath       Brief Narrative  61 y.o. female with hx of severe COPD, prior smoker, on 4L [place] home oxygen, hx of CAD, hx of SVT, HLD, presented to the ER with increase SOB. She has been on chronic steroid, and was found to have a marked leukocytosis with WBC of 18K.Evaluation included a CXR showing no infiltrate, and her bicarb was elevated to 35's. Her ABG showed 7.2/99/pOx=119 of 50%FIO2. She was placed on Bipap.   Subjective:    Virlee Bellow today has, No headache, No chest pain, No abdominal pain - No Nausea, No new weakness tingling or numbness, +ve Cough & SOB.     Assessment  & Plan :     1.Acute on chronic hypoxic and hypercapnic respiratory failure. In a patient with advanced COPD on 4 L nasal cannula oxygen at home, BiPAP at night at baseline along with chronically on oral steroids, who is still smoking.  This is due to COPD exacerbation, pneumonia cannot be entirely ruled out. For now she is on combination of IV steroids, Levaquin, nebulizer treatments and increased oxygen/BiPAP as needed. Pulmonary is following. Patient has been counseled to quit smoking. ABG trend is better, she is compensated around PCO2 of 70 in the past. Continue the present BiPAP settings with some drop in FiO2. Repeat 2 view chest x-ray.   2.Smoking - counseled to quit.  3.Weakness - increase activity, PT.  4. Anxiety and depression.  Continue home regimen of benzodiazepines.  5.Dyslipidemia - on statin  6.Hx of CAD and SVT - stable, pain-free, ASPIRIN, Cardizem and statin combination which will be continued.    Family Communication  :  None  Code Status :  Full  Diet : Diet Heart Room service appropriate? Yes; Fluid consistency: Thin  Disposition Plan  :  Stepdown  Consults  :  Pulm  Procedures  :    DVT Prophylaxis  :  Lovenox    Lab Results  Component Value Date   PLT 366 05/15/2016    Inpatient Medications  Scheduled Meds: . acetylcysteine  4 mL Nebulization Q4H  . aspirin  81 mg Oral Daily  . atorvastatin  40 mg Oral QPM  . chlorpheniramine-HYDROcodone  5 mL Oral Once  . clonazePAM  0.5-0.75 mg Oral QHS  . diltiazem  180 mg Oral Daily  . enoxaparin (LOVENOX) injection  40 mg Subcutaneous Q24H  . escitalopram  20 mg Oral Daily  . [START ON 05/20/2016] fluconazole  100 mg Oral Q Mon-1800  . ipratropium-albuterol  3 mL Nebulization Q4H  . levofloxacin (LEVAQUIN) IV  750 mg Intravenous Q24H  . methylPREDNISolone (SOLU-MEDROL) injection  60 mg Intravenous TID  . multivitamin with minerals  1 tablet Oral Daily  . oxybutynin  5 mg Oral Daily  . roflumilast  500 mcg Oral Daily  . sodium chloride flush  3 mL Intravenous Q12H   Continuous Infusions: . albuterol Stopped (05/13/16 2141)   PRN Meds:.acetaminophen, albuterol, ALPRAZolam, fluticasone  Antibiotics  :    Anti-infectives    Start     Dose/Rate Route Frequency Ordered Stop   05/20/16 1800  fluconazole (DIFLUCAN) tablet 100 mg     100 mg Oral Every Mon (1800) 05/13/16 2310     05/13/16 2315  levofloxacin (LEVAQUIN) IVPB 750 mg     750 mg 100 mL/hr over 90 Minutes Intravenous Every 24 hours 05/13/16 2310     05/13/16 2130  vancomycin (VANCOCIN) IVPB 1000 mg/200 mL premix     1,000 mg 200 mL/hr over 60 Minutes Intravenous  Once 05/13/16 2115 05/13/16 2232   05/13/16 2130  levofloxacin (LEVAQUIN) IVPB 750 mg     750 mg 100 mL/hr  over 90 Minutes Intravenous  Once 05/13/16 2115 05/13/16 2324         Objective:   Vitals:   05/15/16 0320 05/15/16 0400 05/15/16 0812 05/15/16 0813  BP:      Pulse: 86   87  Resp: (!) 25   (!) 22  Temp:  97.9 F (36.6 C)    TempSrc:  Oral    SpO2: 95%  98% 98%  Weight:      Height:        Wt Readings from Last 3 Encounters:  05/13/16 54 kg (119 lb)  02/29/16 54 kg (119 lb)  06/21/15 52.2 kg (115 lb)     Intake/Output Summary (Last 24 hours) at 05/15/16 0843 Last data filed at 05/15/16 0500  Gross per 24 hour  Intake              633 ml  Output              125 ml  Net              508 ml     Physical Exam  Awake Alert, Oriented X 3, No new F.N deficits, Normal affect [place].AT,PERRAL Supple Neck,No JVD, No cervical lymphadenopathy appriciated.  Symmetrical Chest wall movement, Mild to Mod air movement bilaterally, Bilat wheezing RRR,No Gallops,Rubs or new Murmurs, No Parasternal Heave +ve B.Sounds, Abd Soft, No tenderness, No organomegaly appriciated, No rebound - guarding or rigidity. No Cyanosis, Clubbing or edema, No new Rash or bruise       Data Review:    CBC  Recent Labs  Lab 05/13/16 1929 05/15/16 0433  WBC 17.9* 16.3*  HGB 12.7 11.8*  HCT 39.1 38.7  PLT 363 366  MCV 100.8* 103.8*  MCH 32.7 31.6  MCHC 32.5 30.5  RDW 12.5 12.1  LYMPHSABS 1.8  --   MONOABS 0.7  --   EOSABS 0.0  --   BASOSABS 0.0  --     Chemistries   Recent Labs Lab 05/13/16 1929 05/14/16 0540 05/15/16 0433  NA 137 139 140  K 4.3 4.4 3.9  CL 94* 95* 93*  CO2 35* 38* 40*  GLUCOSE 192* 143* 152*  BUN 13 12 17   CREATININE 0.78 0.69 0.64  CALCIUM 8.9 8.7* 8.6*  AST 23  --   --   ALT 19  --   --   ALKPHOS 88  --   --   BILITOT 0.3  --   --    ------------------------------------------------------------------------------------------------------------------ No results for input(s): CHOL, HDL, LDLCALC, TRIG, CHOLHDL, LDLDIRECT in the last 72 hours.  Lab Results    Component Value Date   HGBA1C 6.0 (H) 11/14/2014   ------------------------------------------------------------------------------------------------------------------  Recent Labs  05/13/16 1929  TSH 1.140   ------------------------------------------------------------------------------------------------------------------ No results for input(s): VITAMINB12, FOLATE, FERRITIN, TIBC, IRON, RETICCTPCT in the last 72 hours.  Coagulation profile No results for input(s): INR, PROTIME in the last 168 hours.  No results for input(s): DDIMER in the last 72 hours.  Cardiac Enzymes  Recent Labs Lab 05/13/16 1929  TROPONINI <0.03   ------------------------------------------------------------------------------------------------------------------    Component Value Date/Time   BNP 67.0 05/13/2016 1929    Micro Results Recent Results (from the past 240 hour(s))  MRSA PCR Screening     Status: None   Collection Time: 05/14/16  5:40 PM  Result Value Ref Range Status   MRSA by PCR NEGATIVE NEGATIVE Final    Comment:        The GeneXpert MRSA Assay (FDA approved for NASAL specimens only), is one component of a comprehensive MRSA colonization surveillance program. It is not intended to diagnose MRSA infection nor to guide or monitor treatment for MRSA infections.     Radiology Reports Dg Chest 1 View  Result Date: 05/13/2016 CLINICAL DATA:  Shortness of breath.  Ex-smoker. EXAM: CHEST 1 VIEW COMPARISON:  01/24/2015 FINDINGS: Two frontal radiographs. Midline trachea. Lower cervical spine fixation. Normal heart size. Age advanced aortic atherosclerosis. No definite pleural fluid. No pneumothorax. 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 Scattered lower lobe predominant granulomas bilaterally. Hazy opacities project over both lower lobes. At least partially felt to be due to overlying breast tissue. Improved aeration on the right since the prior frontal radiograph of 11/14/2014.  IMPRESSION: Bullous type emphysema. Increased density projecting over the lung bases is at least partially felt to be due to overlying breast tissue and AP portable technique. Probable concurrent scarring. Cannot exclude mild bibasilar pneumonia. Consider further evaluation with PA and lateral radiographs. Age advanced aortic atherosclerosis. Electronically Signed   By: Jeronimo Greaves M.D.   On: 05/13/2016 19:56    Time Spent in minutes  30   Tytan Sandate K M.D on 05/15/2016 at 8:43 AM  Between 7am to 7pm - Pager - [phone]  After 7pm go to [email or web address] - password [facility]  [facility] -  Office  [phone]

## 2016-05-16 LAB — RESPIRATORY PANEL BY PCR
ADENOVIRUS-RVPPCR: NOT DETECTED
Bordetella pertussis: NOT DETECTED
CORONAVIRUS 229E-RVPPCR: NOT DETECTED
CORONAVIRUS HKU1-RVPPCR: NOT DETECTED
CORONAVIRUS NL63-RVPPCR: NOT DETECTED
Chlamydophila pneumoniae: NOT DETECTED
Coronavirus OC43: NOT DETECTED
Influenza A: NOT DETECTED
Influenza B: NOT DETECTED
METAPNEUMOVIRUS-RVPPCR: NOT DETECTED
Mycoplasma pneumoniae: NOT DETECTED
PARAINFLUENZA VIRUS 1-RVPPCR: NOT DETECTED
PARAINFLUENZA VIRUS 2-RVPPCR: NOT DETECTED
PARAINFLUENZA VIRUS 3-RVPPCR: NOT DETECTED
Parainfluenza Virus 4: NOT DETECTED
RHINOVIRUS / ENTEROVIRUS - RVPPCR: NOT DETECTED
Respiratory Syncytial Virus: NOT DETECTED

## 2016-05-16 LAB — BASIC METABOLIC PANEL
Anion gap: 4 — ABNORMAL LOW (ref 5–15)
BUN: 19 mg/dL (ref 6–20)
CHLORIDE: 93 mmol/L — AB (ref 101–111)
CO2: 41 mmol/L — AB (ref 22–32)
CREATININE: 0.72 mg/dL (ref 0.44–1.00)
Calcium: 8.4 mg/dL — ABNORMAL LOW (ref 8.9–10.3)
GFR calc non Af Amer: >60 mL/min (ref >60)
Glucose, Bld: 172 mg/dL — ABNORMAL HIGH (ref 65–99)
POTASSIUM: 4.3 mmol/L (ref 3.5–5.1)
SODIUM: 138 mmol/L (ref 135–145)

## 2016-05-16 LAB — CBC
HEMATOCRIT: 38.3 % (ref 36.0–46.0)
Hemoglobin: 11.8 g/dL — ABNORMAL LOW (ref 12.0–15.0)
MCH: 31.9 pg (ref 26.0–34.0)
MCHC: 30.8 g/dL (ref 30.0–36.0)
MCV: 103.5 fL — AB (ref 78.0–100.0)
PLATELETS: 368 10*3/uL (ref 150–400)
RBC: 3.7 MIL/uL — AB (ref 3.87–5.11)
RDW: 11.9 % (ref 11.5–15.5)
WBC: 17.6 10*3/uL — AB (ref 4.0–10.5)

## 2016-05-16 NOTE — Progress Notes (Signed)
Subjective: She says she feels better. ABG yesterday better. She was able to come off BIPAP some yesterday. She's not coughing now as much. Chest x-ray yesterday which I personally reviewed does not show any infiltrate. This was a 2 view film. I think it's reasonable to continue her antibiotic for now. No other new complaints. No chest pain no nausea vomiting diarrhea abdominal pain urinary symptoms.  Objective: Vital signs in last 24 hours: Temp:  [96.6 F (35.9 C)-98.2 F (36.8 C)] 97.9 F (36.6 C) (01/11 0400) Pulse Rate:  [77-109] 80 (01/11 0600) Resp:  [16-26] 22 (01/11 0600) BP: (107-136)/(62-101) 121/80 (01/11 0600) SpO2:  [89 %-98 %] 91 % (01/11 0600) FiO2 (%):  [30 %-40 %] 35 % (01/11 0345) Weight:  [54 kg (119 lb 0.8 oz)] 54 kg (119 lb 0.8 oz) (01/11 0444) Weight change:  Last BM Date: 05/13/16  Intake/Output from previous day: 01/10 0701 - 01/11 0700 In: 870 [P.O.:720; IV Piggyback:150] Out: 700 [Urine:700]  PHYSICAL EXAM General appearance: alert and On BiPAP Resp: rhonchi bilaterally Cardio: regular rate and rhythm, S1, S2 normal, no murmur, click, rub or gallop GI: soft, non-tender; bowel sounds normal; no masses,  no organomegaly Extremities: extremities normal, atraumatic, no cyanosis or edema Skin warm and dry. Mucous membranes moist  Lab Results:  Results for orders placed or performed during the hospital encounter of 05/13/16 (from the past 48 hour(s))  MRSA PCR Screening     Status: None   Collection Time: 05/14/16  5:40 PM  Result Value Ref Range   MRSA by PCR NEGATIVE NEGATIVE    Comment:        The GeneXpert MRSA Assay (FDA approved for NASAL specimens only), is one component of a comprehensive MRSA colonization surveillance program. It is not intended to diagnose MRSA infection nor to guide or monitor treatment for MRSA infections.   Basic metabolic panel     Status: Abnormal   Collection Time: 05/15/16  4:33 AM  Result Value Ref Range   Sodium 140 135 - 145 mmol/L   Potassium 3.9 3.5 - 5.1 mmol/L   Chloride 93 (L) 101 - 111 mmol/L   CO2 40 (H) 22 - 32 mmol/L   Glucose, Bld 152 (H) 65 - 99 mg/dL   BUN 17 6 - 20 mg/dL   Creatinine, Ser 0.64 0.44 - 1.00 mg/dL   Calcium 8.6 (L) 8.9 - 10.3 mg/dL   GFR calc non Af Amer >60 >60 mL/min   GFR calc Af Amer >60 >60 mL/min    Comment: (NOTE) The eGFR has been calculated using the CKD EPI equation. This calculation has not been validated in all clinical situations. eGFR's persistently <60 mL/min signify possible Chronic Kidney Disease.    Anion gap 7 5 - 15  CBC     Status: Abnormal   Collection Time: 05/15/16  4:33 AM  Result Value Ref Range   WBC 16.3 (H) 4.0 - 10.5 K/uL   RBC 3.73 (L) 3.87 - 5.11 MIL/uL   Hemoglobin 11.8 (L) 12.0 - 15.0 g/dL   HCT 38.7 36.0 - 46.0 %   MCV 103.8 (H) 78.0 - 100.0 fL   MCH 31.6 26.0 - 34.0 pg   MCHC 30.5 30.0 - 36.0 g/dL   RDW 12.1 11.5 - 15.5 %   Platelets 366 150 - 400 K/uL  Blood gas, arterial     Status: Abnormal   Collection Time: 05/15/16  8:15 AM  Result Value Ref Range   FIO2 40.00  Delivery systems BILEVEL POSITIVE AIRWAY PRESSURE    Mode BILEVEL POSITIVE AIRWAY PRESSURE    Inspiratory PAP 14    Expiratory PAP 7    pH, Arterial 7.321 (L) 7.350 - 7.450   pCO2 arterial 84.6 (HH) 32.0 - 48.0 mmHg    Comment: CRITICAL RESULT CALLED TO, READ BACK BY AND VERIFIED WITH: WILLIE,E.RN AT 2482 05/15/16 BY BROADNAX,L.RRT    pO2, Arterial 117.00 (H) 83.0 - 108.0 mmHg   Bicarbonate 37.0 (H) 20.0 - 28.0 mmol/L   Acid-Base Excess 15.8 (H) 0.0 - 2.0 mmol/L   O2 Saturation 97.5 %   Collection site LEFT RADIAL    Drawn by [id number]    Sample type ARTERIAL    Allens test (pass/fail) PASS PASS  Glucose, capillary     Status: Abnormal   Collection Time: 05/15/16  4:32 PM  Result Value Ref Range   Glucose-Capillary 158 (H) 65 - 99 mg/dL   Comment 1 Notify RN   Basic metabolic panel     Status: Abnormal   Collection Time: 05/16/16  4:39  AM  Result Value Ref Range   Sodium 138 135 - 145 mmol/L   Potassium 4.3 3.5 - 5.1 mmol/L   Chloride 93 (L) 101 - 111 mmol/L   CO2 41 (H) 22 - 32 mmol/L   Glucose, Bld 172 (H) 65 - 99 mg/dL   BUN 19 6 - 20 mg/dL   Creatinine, Ser 0.72 0.44 - 1.00 mg/dL   Calcium 8.4 (L) 8.9 - 10.3 mg/dL   GFR calc non Af Amer >60 >60 mL/min   GFR calc Af Amer >60 >60 mL/min    Comment: (NOTE) The eGFR has been calculated using the CKD EPI equation. This calculation has not been validated in all clinical situations. eGFR's persistently <60 mL/min signify possible Chronic Kidney Disease.    Anion gap 4 (L) 5 - 15  CBC     Status: Abnormal   Collection Time: 05/16/16  4:39 AM  Result Value Ref Range   WBC 17.6 (H) 4.0 - 10.5 K/uL   RBC 3.70 (L) 3.87 - 5.11 MIL/uL   Hemoglobin 11.8 (L) 12.0 - 15.0 g/dL   HCT 38.3 36.0 - 46.0 %   MCV 103.5 (H) 78.0 - 100.0 fL   MCH 31.9 26.0 - 34.0 pg   MCHC 30.8 30.0 - 36.0 g/dL   RDW 11.9 11.5 - 15.5 %   Platelets 368 150 - 400 K/uL    ABGS  Recent Labs  05/15/16 0815  PHART 7.321*  PO2ART 117.00*  HCO3 37.0*   CULTURES Recent Results (from the past 240 hour(s))  MRSA PCR Screening     Status: None   Collection Time: 05/14/16  5:40 PM  Result Value Ref Range Status   MRSA by PCR NEGATIVE NEGATIVE Final    Comment:        The GeneXpert MRSA Assay (FDA approved for NASAL specimens only), is one component of a comprehensive MRSA colonization surveillance program. It is not intended to diagnose MRSA infection nor to guide or monitor treatment for MRSA infections.    Studies/Results: Dg Chest 2 View  Result Date: 05/15/2016 CLINICAL DATA:  Cough, shortness of breath. EXAM: CHEST  2 VIEW COMPARISON:  Radiographs of May 13, 2016. FINDINGS: Stable cardiomediastinal silhouette. Atherosclerosis of thoracic aorta is noted. Stable small calcified granulomata are noted bilaterally. Emphysematous disease is noted in the upper lobes bilaterally. No  pneumothorax or pleural effusion is noted. No acute pulmonary disease is noted.  Bony thorax is unremarkable. IMPRESSION: No active cardiopulmonary disease. Aortic atherosclerosis. Stable emphysematous disease. Electronically Signed   By: Marijo Conception, M.D.   On: 05/15/2016 10:42    Medications:  Prior to Admission:  Prescriptions Prior to Admission  Medication Sig Dispense Refill Last Dose  . albuterol (PROAIR HFA) 108 (90 BASE) MCG/ACT inhaler Inhale 2 puffs into the lungs every 6 (six) hours as needed. For shortness of breath   05/13/2016 at Unknown time  . ALPRAZolam (XANAX) 0.5 MG tablet Take 0.5 mg by mouth daily as needed. anxiety   05/13/2016 at Unknown time  . aspirin 81 MG tablet Take 81 mg by mouth daily.   05/13/2016 at Unknown time  . atorvastatin (LIPITOR) 40 MG tablet Take 1 tablet (40 mg total) by mouth daily. (Patient taking differently: Take 40 mg by mouth every evening. ) 90 tablet 3 05/12/2016 at Unknown time  . azithromycin (ZITHROMAX) 250 MG tablet Take 250 mg by mouth every Monday, Wednesday, and Friday.    05/10/2016  . cetirizine (ZYRTEC) 10 MG tablet Take 10 mg by mouth daily.     05/13/2016 at Unknown time  . clonazePAM (KLONOPIN) 0.5 MG tablet Take 0.5-0.75 mg by mouth at bedtime.    05/12/2016 at Unknown time  . diltiazem (CARDIZEM CD) 180 MG 24 hr capsule Take 1 capsule (180 mg total) by mouth daily. 90 capsule 3 05/13/2016 at Unknown time  . escitalopram (LEXAPRO) 20 MG tablet Take 20 mg by mouth daily.     05/13/2016 at Unknown time  . fluconazole (DIFLUCAN) 150 MG tablet Take 150 mg by mouth once a week.    05/13/2016 at Unknown time  . Fluticasone-Salmeterol (ADVAIR) 250-50 MCG/DOSE AEPB Inhale 1 puff into the lungs 2 (two) times daily.    05/13/2016 at Unknown time  . guaiFENesin (MUCINEX) 600 MG 12 hr tablet Take 1 tablet (600 mg total) by mouth 2 (two) times daily. 20 tablet 0 05/13/2016 at Unknown time  . ibuprofen (ADVIL,MOTRIN) 200 MG tablet Take 400 mg by mouth 2 (two) times  daily as needed for mild pain or moderate pain.   05/13/2016 at Unknown time  . ipratropium-albuterol (DUONEB) 0.5-2.5 (3) MG/3ML SOLN Take 3 mLs by nebulization 3 (three) times daily. 360 mL 1 05/13/2016 at Unknown time  . levofloxacin (LEVAQUIN) 750 MG tablet Take 750 mg by mouth daily.   05/13/2016 at Unknown time  . montelukast (SINGULAIR) 10 MG tablet Take 1 tablet (10 mg total) by mouth at bedtime. 30 tablet 1 05/12/2016 at Unknown time  . Multiple Vitamins-Minerals (MULTIVITAMINS THER. W/MINERALS) TABS Take 1 tablet by mouth daily.     05/13/2016 at Unknown time  . oxybutynin (DITROPAN) 5 MG tablet Take 5 mg by mouth daily.    05/13/2016 at Unknown time  . predniSONE (DELTASONE) 10 MG tablet Take 10 mg by mouth every morning.   HOLD  . roflumilast (DALIRESP) 500 MCG TABS tablet Take 500 mcg by mouth daily.     05/13/2016 at Unknown time  . SPIRIVA RESPIMAT 2.5 MCG/ACT AERS Inhale 1 puff into the lungs daily.    05/13/2016 at Unknown time  . triamcinolone (NASACORT ALLERGY 24HR) 55 MCG/ACT AERO nasal inhaler Place 2 sprays into the nose daily as needed (FOR ALLERGIES).   UNKNOWN  . predniSONE (DELTASONE) 10 MG tablet Take 60 mg x 3 days then 40 mg x 3 days then 30 mg x 3 days then 75m x 3 days then resume maintenance dose 10 mg  daily (Patient not taking: Reported on 05/13/2016) 60 tablet 2 Not Taking at Unknown time   Scheduled: . acetylcysteine  4 mL Nebulization Q4H  . aspirin  81 mg Oral Daily  . atorvastatin  40 mg Oral QPM  . chlorpheniramine-HYDROcodone  5 mL Oral Once  . clonazePAM  0.5-0.75 mg Oral QHS  . diltiazem  180 mg Oral Daily  . enoxaparin (LOVENOX) injection  40 mg Subcutaneous Q24H  . escitalopram  20 mg Oral Daily  . [START ON 05/20/2016] fluconazole  100 mg Oral Q Mon-1800  . ipratropium-albuterol  3 mL Nebulization Q4H  . levofloxacin (LEVAQUIN) IV  750 mg Intravenous Q24H  . mouth rinse  15 mL Mouth Rinse BID  . methylPREDNISolone (SOLU-MEDROL) injection  60 mg Intravenous TID  .  multivitamin with minerals  1 tablet Oral Daily  . oxybutynin  5 mg Oral Daily  . roflumilast  500 mcg Oral Daily  . sodium chloride flush  3 mL Intravenous Q12H   Continuous: . albuterol Stopped (05/13/16 2141)   TGG:YIRSWNIOEVOJJ, albuterol, ALPRAZolam, fluticasone  Assesment:She is admitted with acute on chronic hypercapnic/hypoxic respiratory failure. 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 She uses BiPAP at home. She is improving. Clinically she looks much better. Her pH was up to 7.3 yesterday. Chest x-ray does not show infiltrate. Principal Problem:   Acute respiratory failure with hypercapnia (HCC) Active Problems:   Hyperlipidemia   Acute on chronic respiratory failure (HCC)   Chronic respiratory failure with hypoxia (HCC)   Leukocytosis    Plan: Continue treatments. Come off BiPAP as possible. Continue steroids and nebulized bronchodilators etc.    LOS: 3 days   Reality Dejonge L 05/16/2016, 6:59 AM

## 2016-05-16 NOTE — Progress Notes (Signed)
PROGRESS NOTE                                                                                                                                                                                                             Patient Demographics:    [name], is a 60 y.o. female, DOB - Apr 21, 1956, ZOX:[id number]  Admit date - 05/13/2016   Admitting Physician Houston Siren, MD  Outpatient Primary MD for the patient is Cassell Smiles., MD  LOS - 3  Chief Complaint  Patient presents with  . Shortness of Breath       Brief Narrative  61 y.o. female with hx of severe COPD, prior smoker, on 4L [place] home oxygen, hx of CAD, hx of SVT, HLD, presented to the ER with increase SOB. She has been on chronic steroid, and was found to have a marked leukocytosis with WBC of 18K.Evaluation included a CXR showing no infiltrate, and her bicarb was elevated to 35's. Her ABG showed 7.2/99/pOx=119 of 50%FIO2. She was placed on Bipap.   Subjective:    [name] today has, No headache, No chest pain, No abdominal pain - No Nausea, No new weakness tingling or numbness, +ve Cough & SOB but feels better.     Assessment  & Plan :     1.Acute on chronic hypoxic and hypercapnic respiratory failure. In a patient with advanced COPD on 4 L nasal cannula oxygen at home, BiPAP at night at baseline along with chronically on oral steroids, who is still smoking.  This is due to COPD exacerbation, pneumonia cannot be entirely ruled out. For now she is on combination of IV steroids, Levaquin, nebulizer treatments and increased oxygen/BiPAP as needed. Pulmonary is following. Patient has been counseled to quit smoking. ABG trend is better, she was compensated around PCO2 of 70 in the past. Continue the present BiPAP PRN in am and scheduled QHS. Repeat 2 view chest x-ray was stable.   2.Smoking - counseled to quit.  3.Weakness - increase activity, PT.  4.  Anxiety and depression. Continue home regimen of benzodiazepines.  5.Dyslipidemia - on statin  6.Hx of CAD and SVT - stable, pain-free, aspirin, Cardizem and statin combination which will be continued.    Family Communication  :  None  Code Status :  Full  Diet : Diet Heart Room service appropriate?  Yes; Fluid consistency: Thin   Disposition Plan  :  Stepdown  Consults  :  Pulm  Procedures  :    DVT Prophylaxis  :  Lovenox    Lab Results  Component Value Date   PLT 368 05/16/2016    Inpatient Medications  Scheduled Meds: . acetylcysteine  4 mL Nebulization Q4H  . aspirin  81 mg Oral Daily  . atorvastatin  40 mg Oral QPM  . chlorpheniramine-HYDROcodone  5 mL Oral Once  . clonazePAM  0.5-0.75 mg Oral QHS  . diltiazem  180 mg Oral Daily  . enoxaparin (LOVENOX) injection  40 mg Subcutaneous Q24H  . escitalopram  20 mg Oral Daily  . [START ON 05/20/2016] fluconazole  100 mg Oral Q Mon-1800  . ipratropium-albuterol  3 mL Nebulization Q4H  . levofloxacin (LEVAQUIN) IV  750 mg Intravenous Q24H  . mouth rinse  15 mL Mouth Rinse BID  . methylPREDNISolone (SOLU-MEDROL) injection  60 mg Intravenous TID  . multivitamin with minerals  1 tablet Oral Daily  . oxybutynin  5 mg Oral Daily  . roflumilast  500 mcg Oral Daily  . sodium chloride flush  3 mL Intravenous Q12H   Continuous Infusions: . albuterol Stopped (05/13/16 2141)   PRN Meds:.acetaminophen, albuterol, ALPRAZolam, fluticasone  Antibiotics  :    Anti-infectives    Start     Dose/Rate Route Frequency Ordered Stop   05/20/16 1800  fluconazole (DIFLUCAN) tablet 100 mg     100 mg Oral Every Mon (1800) 05/13/16 2310     05/13/16 2315  levofloxacin (LEVAQUIN) IVPB 750 mg     750 mg 100 mL/hr over 90 Minutes Intravenous Every 24 hours 05/13/16 2310     05/13/16 2130  vancomycin (VANCOCIN) IVPB 1000 mg/200 mL premix     1,000 mg 200 mL/hr over 60 Minutes Intravenous  Once 05/13/16 2115 05/13/16 2232   05/13/16 2130   levofloxacin (LEVAQUIN) IVPB 750 mg     750 mg 100 mL/hr over 90 Minutes Intravenous  Once 05/13/16 2115 05/13/16 2324         Objective:   Vitals:   05/16/16 0500 05/16/16 0600 05/16/16 0829 05/16/16 0831  BP: 124/79 121/80    Pulse: 81 80  84  Resp: (!) 26 (!) 22  (!) 23  Temp:      TempSrc:      SpO2: 95% 91% 93%   Weight:      Height:        Wt Readings from Last 3 Encounters:  05/16/16 54 kg (119 lb 0.8 oz)  02/29/16 54 kg (119 lb)  06/21/15 52.2 kg (115 lb)     Intake/Output Summary (Last 24 hours) at 05/16/16 0914 Last data filed at 05/16/16 0600  Gross per 24 hour  Intake              630 ml  Output              200 ml  Net              430 ml     Physical Exam  Awake Alert, Oriented X 3, No new F.N deficits, Normal affect [place].AT,PERRAL Supple Neck,No JVD, No cervical lymphadenopathy appriciated.  Symmetrical Chest wall movement, Mild to Mod air movement bilaterally, Bilat wheezing RRR,No Gallops,Rubs or new Murmurs, No Parasternal Heave +ve B.Sounds, Abd Soft, No tenderness, No organomegaly appriciated, No rebound - guarding or rigidity. No Cyanosis, Clubbing or edema, No new Rash or  bruise       Data Review:    CBC  Recent Labs Lab 05/13/16 1929 05/15/16 0433 05/16/16 0439  WBC 17.9* 16.3* 17.6*  HGB 12.7 11.8* 11.8*  HCT 39.1 38.7 38.3  PLT 363 366 368  MCV 100.8* 103.8* 103.5*  MCH 32.7 31.6 31.9  MCHC 32.5 30.5 30.8  RDW 12.5 12.1 11.9  LYMPHSABS 1.8  --   --   MONOABS 0.7  --   --   EOSABS 0.0  --   --   BASOSABS 0.0  --   --     Chemistries   Recent Labs Lab 05/13/16 1929 05/14/16 0540 05/15/16 0433 05/16/16 0439  NA 137 139 140 138  K 4.3 4.4 3.9 4.3  CL 94* 95* 93* 93*  CO2 35* 38* 40* 41*  GLUCOSE 192* 143* 152* 172*  BUN 13 12 17 19   CREATININE 0.78 0.69 0.64 0.72  CALCIUM 8.9 8.7* 8.6* 8.4*  AST 23  --   --   --   ALT 19  --   --   --   ALKPHOS 88  --   --   --   BILITOT 0.3  --   --   --     ------------------------------------------------------------------------------------------------------------------ No results for input(s): CHOL, HDL, LDLCALC, TRIG, CHOLHDL, LDLDIRECT in the last 72 hours.  Lab Results  Component Value Date   HGBA1C 6.0 (H) 11/14/2014   ------------------------------------------------------------------------------------------------------------------  Recent Labs  05/13/16 1929  TSH 1.140   ------------------------------------------------------------------------------------------------------------------ No results for input(s): VITAMINB12, FOLATE, FERRITIN, TIBC, IRON, RETICCTPCT in the last 72 hours.  Coagulation profile No results for input(s): INR, PROTIME in the last 168 hours.  No results for input(s): DDIMER in the last 72 hours.  Cardiac Enzymes  Recent Labs Lab 05/13/16 1929  TROPONINI <0.03   ------------------------------------------------------------------------------------------------------------------    Component Value Date/Time   BNP 67.0 05/13/2016 1929    Micro Results Recent Results (from the past 240 hour(s))  MRSA PCR Screening     Status: None   Collection Time: 05/14/16  5:40 PM  Result Value Ref Range Status   MRSA by PCR NEGATIVE NEGATIVE Final    Comment:        The GeneXpert MRSA Assay (FDA approved for NASAL specimens only), is one component of a comprehensive MRSA colonization surveillance program. It is not intended to diagnose MRSA infection nor to guide or monitor treatment for MRSA infections.     Radiology Reports Dg Chest 1 View  Result Date: 05/13/2016 CLINICAL DATA:  Shortness of breath.  Ex-smoker. EXAM: CHEST 1 VIEW COMPARISON:  01/24/2015 FINDINGS: Two frontal radiographs. Midline trachea. Lower cervical spine fixation. Normal heart size. Age advanced aortic atherosclerosis. No definite pleural fluid. No pneumothorax. 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 Scattered lower lobe  predominant granulomas bilaterally. Hazy opacities project over both lower lobes. At least partially felt to be due to overlying breast tissue. Improved aeration on the right since the prior frontal radiograph of 11/14/2014. IMPRESSION: Bullous type emphysema. Increased density projecting over the lung bases is at least partially felt to be due to overlying breast tissue and AP portable technique. Probable concurrent scarring. Cannot exclude mild bibasilar pneumonia. Consider further evaluation with PA and lateral radiographs. Age advanced aortic atherosclerosis. Electronically Signed   By: Jeronimo Greaves M.D.   On: 05/13/2016 19:56   Dg Chest 2 View  Result Date: 05/15/2016 CLINICAL DATA:  Cough, shortness of breath. EXAM: CHEST  2 VIEW COMPARISON:  Radiographs of May 13, 2016. FINDINGS: Stable cardiomediastinal silhouette. Atherosclerosis of thoracic aorta is noted. Stable small calcified granulomata are noted bilaterally. Emphysematous disease is noted in the upper lobes bilaterally. No pneumothorax or pleural effusion is noted. No acute pulmonary disease is noted. Bony thorax is unremarkable. IMPRESSION: No active cardiopulmonary disease. Aortic atherosclerosis. Stable emphysematous disease. Electronically Signed   By: Lupita Raider, M.D.   On: 05/15/2016 10:42    Time Spent in minutes  30   Alvester Eads K M.D on 05/16/2016 at 9:14 AM  Between 7am to 7pm - Pager - [phone]  After 7pm go to [email or web address] - password [facility]  [facility] -  Office  [phone]

## 2016-05-16 NOTE — Progress Notes (Addendum)
PT Cancellation Note  Patient Details Name: [name] MRN: [id number] DOB: 03/29/1956   Cancelled Treatment:    Reason Eval/Treat Not Completed: Patient's level of consciousness;Patient not medically ready (Pt is sleeping soundly, and is currently on BiPap.  Will check back later today.  )   Checked back on pt at 1338, and discussed with pt's nurse, Festus Holts, who expressed that pt desaturates when she is off the BiPap, and she just finished eating lunch, and is exhausted.  Will check back tomorrow to perform initial PT evaluation.    [name], PT, DPT X: [phone]

## 2016-05-17 MED ORDER — GUAIFENESIN ER 600 MG PO TB12
1200.0000 mg | ORAL_TABLET | Freq: Two times a day (BID) | ORAL | Status: DC
  Administered 2016-05-17 – 2016-05-28 (×24): 1200 mg via ORAL
  Filled 2016-05-17 (×24): qty 2

## 2016-05-17 NOTE — Progress Notes (Signed)
Subjective: She says she feels a little better. She is still requiring BiPAP more than at home. Yesterday she had some desaturation when she was off the BiPAP. She is coughing nonproductively. She is alert. No chest pain no nausea vomiting diarrhea.  Objective: Vital signs in last 24 hours: Temp:  [97.3 F (36.3 C)-98.4 F (36.9 C)] 98 F (36.7 C) (01/12 0400) Pulse Rate:  [71-98] 77 (01/12 0700) Resp:  [16-30] 24 (01/12 0700) BP: (113-166)/(57-82) 122/78 (01/12 0700) SpO2:  [83 %-99 %] 97 % (01/12 0700) FiO2 (%):  [35 %-40 %] 40 % (01/12 0700) Weight:  [54.5 kg (120 lb 2.4 oz)] 54.5 kg (120 lb 2.4 oz) (01/12 0458) Weight change: 0.5 kg (1 lb 1.6 oz) Last BM Date: 05/13/16  Intake/Output from previous day: 01/11 0701 - 01/12 0700 In: 630 [P.O.:480; IV Piggyback:150] Out: 1000 [Urine:1000]  PHYSICAL EXAM General appearance: alert, cooperative, mild distress and On BiPAP Resp: Lungs are clear with prolonged expiratory phase. Cardio: Heart is regular with normal heart sounds. No edema GI: soft, non-tender; bowel sounds normal; no masses,  no organomegaly Extremities: extremities normal, atraumatic, no cyanosis or edema Skin warm and dry. I cannot assess her mucous membranes. Pupils are reactive  Lab Results:  Results for orders placed or performed during the hospital encounter of 05/13/16 (from the past 48 hour(s))  Blood gas, arterial     Status: Abnormal   Collection Time: 05/15/16  8:15 AM  Result Value Ref Range   FIO2 40.00    Delivery systems BILEVEL POSITIVE AIRWAY PRESSURE    Mode BILEVEL POSITIVE AIRWAY PRESSURE    Inspiratory PAP 14    Expiratory PAP 7    pH, Arterial 7.321 (L) 7.350 - 7.450   pCO2 arterial 84.6 (HH) 32.0 - 48.0 mmHg    Comment: CRITICAL RESULT CALLED TO, READ BACK BY AND VERIFIED WITH: WILLIE,E.RN AT 1610 05/15/16 BY BROADNAX,L.RRT    pO2, Arterial 117.00 (H) 83.0 - 108.0 mmHg   Bicarbonate 37.0 (H) 20.0 - 28.0 mmol/L   Acid-Base Excess 15.8  (H) 0.0 - 2.0 mmol/L   O2 Saturation 97.5 %   Collection site LEFT RADIAL    Drawn by [id number]    Sample type ARTERIAL    Allens test (pass/fail) PASS PASS  Respiratory Panel by PCR     Status: None   Collection Time: 05/15/16 10:54 AM  Result Value Ref Range   Adenovirus NOT DETECTED NOT DETECTED   Coronavirus 229E NOT DETECTED NOT DETECTED   Coronavirus HKU1 NOT DETECTED NOT DETECTED   Coronavirus NL63 NOT DETECTED NOT DETECTED   Coronavirus OC43 NOT DETECTED NOT DETECTED   Metapneumovirus NOT DETECTED NOT DETECTED   Rhinovirus / Enterovirus NOT DETECTED NOT DETECTED   Influenza A NOT DETECTED NOT DETECTED   Influenza B NOT DETECTED NOT DETECTED   Parainfluenza Virus 1 NOT DETECTED NOT DETECTED   Parainfluenza Virus 2 NOT DETECTED NOT DETECTED   Parainfluenza Virus 3 NOT DETECTED NOT DETECTED   Parainfluenza Virus 4 NOT DETECTED NOT DETECTED   Respiratory Syncytial Virus NOT DETECTED NOT DETECTED   Bordetella pertussis NOT DETECTED NOT DETECTED   Chlamydophila pneumoniae NOT DETECTED NOT DETECTED   Mycoplasma pneumoniae NOT DETECTED NOT DETECTED    Comment: Performed at [facility]  Glucose, capillary     Status: Abnormal   Collection Time: 05/15/16  4:32 PM  Result Value Ref Range   Glucose-Capillary 158 (H) 65 - 99 mg/dL   Comment 1 Notify RN  Basic metabolic panel     Status: Abnormal   Collection Time: 05/16/16  4:39 AM  Result Value Ref Range   Sodium 138 135 - 145 mmol/L   Potassium 4.3 3.5 - 5.1 mmol/L   Chloride 93 (L) 101 - 111 mmol/L   CO2 41 (H) 22 - 32 mmol/L   Glucose, Bld 172 (H) 65 - 99 mg/dL   BUN 19 6 - 20 mg/dL   Creatinine, Ser 0.72 0.44 - 1.00 mg/dL   Calcium 8.4 (L) 8.9 - 10.3 mg/dL   GFR calc non Af Amer >60 >60 mL/min   GFR calc Af Amer >60 >60 mL/min    Comment: (NOTE) The eGFR has been calculated using the CKD EPI equation. This calculation has not been validated in all clinical situations. eGFR's persistently <60 mL/min signify  possible Chronic Kidney Disease.    Anion gap 4 (L) 5 - 15  CBC     Status: Abnormal   Collection Time: 05/16/16  4:39 AM  Result Value Ref Range   WBC 17.6 (H) 4.0 - 10.5 K/uL   RBC 3.70 (L) 3.87 - 5.11 MIL/uL   Hemoglobin 11.8 (L) 12.0 - 15.0 g/dL   HCT 38.3 36.0 - 46.0 %   MCV 103.5 (H) 78.0 - 100.0 fL   MCH 31.9 26.0 - 34.0 pg   MCHC 30.8 30.0 - 36.0 g/dL   RDW 11.9 11.5 - 15.5 %   Platelets 368 150 - 400 K/uL    ABGS  Recent Labs  05/15/16 0815  PHART 7.321*  PO2ART 117.00*  HCO3 37.0*   CULTURES Recent Results (from the past 240 hour(s))  MRSA PCR Screening     Status: None   Collection Time: 05/14/16  5:40 PM  Result Value Ref Range Status   MRSA by PCR NEGATIVE NEGATIVE Final    Comment:        The GeneXpert MRSA Assay (FDA approved for NASAL specimens only), is one component of a comprehensive MRSA colonization surveillance program. It is not intended to diagnose MRSA infection nor to guide or monitor treatment for MRSA infections.   Respiratory Panel by PCR     Status: None   Collection Time: 05/15/16 10:54 AM  Result Value Ref Range Status   Adenovirus NOT DETECTED NOT DETECTED Final   Coronavirus 229E NOT DETECTED NOT DETECTED Final   Coronavirus HKU1 NOT DETECTED NOT DETECTED Final   Coronavirus NL63 NOT DETECTED NOT DETECTED Final   Coronavirus OC43 NOT DETECTED NOT DETECTED Final   Metapneumovirus NOT DETECTED NOT DETECTED Final   Rhinovirus / Enterovirus NOT DETECTED NOT DETECTED Final   Influenza A NOT DETECTED NOT DETECTED Final   Influenza B NOT DETECTED NOT DETECTED Final   Parainfluenza Virus 1 NOT DETECTED NOT DETECTED Final   Parainfluenza Virus 2 NOT DETECTED NOT DETECTED Final   Parainfluenza Virus 3 NOT DETECTED NOT DETECTED Final   Parainfluenza Virus 4 NOT DETECTED NOT DETECTED Final   Respiratory Syncytial Virus NOT DETECTED NOT DETECTED Final   Bordetella pertussis NOT DETECTED NOT DETECTED Final   Chlamydophila pneumoniae  NOT DETECTED NOT DETECTED Final   Mycoplasma pneumoniae NOT DETECTED NOT DETECTED Final    Comment: Performed at [facility]   Studies/Results: Dg Chest 2 View  Result Date: 05/15/2016 CLINICAL DATA:  Cough, shortness of breath. EXAM: CHEST  2 VIEW COMPARISON:  Radiographs of May 13, 2016. FINDINGS: Stable cardiomediastinal silhouette. Atherosclerosis of thoracic aorta is noted. Stable small calcified granulomata are noted  bilaterally. Emphysematous disease is noted in the upper lobes bilaterally. No pneumothorax or pleural effusion is noted. No acute pulmonary disease is noted. Bony thorax is unremarkable. IMPRESSION: No active cardiopulmonary disease. Aortic atherosclerosis. Stable emphysematous disease. Electronically Signed   By: Marijo Conception, M.D.   On: 05/15/2016 10:42    Medications:  Prior to Admission:  Prescriptions Prior to Admission  Medication Sig Dispense Refill Last Dose  . albuterol (PROAIR HFA) 108 (90 BASE) MCG/ACT inhaler Inhale 2 puffs into the lungs every 6 (six) hours as needed. For shortness of breath   05/13/2016 at Unknown time  . ALPRAZolam (XANAX) 0.5 MG tablet Take 0.5 mg by mouth daily as needed. anxiety   05/13/2016 at Unknown time  . aspirin 81 MG tablet Take 81 mg by mouth daily.   05/13/2016 at Unknown time  . atorvastatin (LIPITOR) 40 MG tablet Take 1 tablet (40 mg total) by mouth daily. (Patient taking differently: Take 40 mg by mouth every evening. ) 90 tablet 3 05/12/2016 at Unknown time  . azithromycin (ZITHROMAX) 250 MG tablet Take 250 mg by mouth every Monday, Wednesday, and Friday.    05/10/2016  . cetirizine (ZYRTEC) 10 MG tablet Take 10 mg by mouth daily.     05/13/2016 at Unknown time  . clonazePAM (KLONOPIN) 0.5 MG tablet Take 0.5-0.75 mg by mouth at bedtime.    05/12/2016 at Unknown time  . diltiazem (CARDIZEM CD) 180 MG 24 hr capsule Take 1 capsule (180 mg total) by mouth daily. 90 capsule 3 05/13/2016 at Unknown time  . escitalopram (LEXAPRO) 20 MG  tablet Take 20 mg by mouth daily.     05/13/2016 at Unknown time  . fluconazole (DIFLUCAN) 150 MG tablet Take 150 mg by mouth once a week.    05/13/2016 at Unknown time  . Fluticasone-Salmeterol (ADVAIR) 250-50 MCG/DOSE AEPB Inhale 1 puff into the lungs 2 (two) times daily.    05/13/2016 at Unknown time  . guaiFENesin (MUCINEX) 600 MG 12 hr tablet Take 1 tablet (600 mg total) by mouth 2 (two) times daily. 20 tablet 0 05/13/2016 at Unknown time  . ibuprofen (ADVIL,MOTRIN) 200 MG tablet Take 400 mg by mouth 2 (two) times daily as needed for mild pain or moderate pain.   05/13/2016 at Unknown time  . ipratropium-albuterol (DUONEB) 0.5-2.5 (3) MG/3ML SOLN Take 3 mLs by nebulization 3 (three) times daily. 360 mL 1 05/13/2016 at Unknown time  . levofloxacin (LEVAQUIN) 750 MG tablet Take 750 mg by mouth daily.   05/13/2016 at Unknown time  . montelukast (SINGULAIR) 10 MG tablet Take 1 tablet (10 mg total) by mouth at bedtime. 30 tablet 1 05/12/2016 at Unknown time  . Multiple Vitamins-Minerals (MULTIVITAMINS THER. W/MINERALS) TABS Take 1 tablet by mouth daily.     05/13/2016 at Unknown time  . oxybutynin (DITROPAN) 5 MG tablet Take 5 mg by mouth daily.    05/13/2016 at Unknown time  . predniSONE (DELTASONE) 10 MG tablet Take 10 mg by mouth every morning.   HOLD  . roflumilast (DALIRESP) 500 MCG TABS tablet Take 500 mcg by mouth daily.     05/13/2016 at Unknown time  . SPIRIVA RESPIMAT 2.5 MCG/ACT AERS Inhale 1 puff into the lungs daily.    05/13/2016 at Unknown time  . triamcinolone (NASACORT ALLERGY 24HR) 55 MCG/ACT AERO nasal inhaler Place 2 sprays into the nose daily as needed (FOR ALLERGIES).   UNKNOWN  . predniSONE (DELTASONE) 10 MG tablet Take 60 mg x 3 days  then 40 mg x 3 days then 30 mg x 3 days then '20mg'$  x 3 days then resume maintenance dose 10 mg daily (Patient not taking: Reported on 05/13/2016) 60 tablet 2 Not Taking at Unknown time   Scheduled: . acetylcysteine  4 mL Nebulization Q4H  . aspirin  81 mg Oral Daily  .  atorvastatin  40 mg Oral QPM  . chlorpheniramine-HYDROcodone  5 mL Oral Once  . clonazePAM  0.5-0.75 mg Oral QHS  . diltiazem  180 mg Oral Daily  . enoxaparin (LOVENOX) injection  40 mg Subcutaneous Q24H  . escitalopram  20 mg Oral Daily  . [START ON 05/20/2016] fluconazole  100 mg Oral Q Mon-1800  . guaiFENesin  1,200 mg Oral BID  . ipratropium-albuterol  3 mL Nebulization Q4H  . levofloxacin (LEVAQUIN) IV  750 mg Intravenous Q24H  . mouth rinse  15 mL Mouth Rinse BID  . methylPREDNISolone (SOLU-MEDROL) injection  60 mg Intravenous TID  . multivitamin with minerals  1 tablet Oral Daily  . oxybutynin  5 mg Oral Daily  . roflumilast  500 mcg Oral Daily  . sodium chloride flush  3 mL Intravenous Q12H   Continuous: . albuterol Stopped (05/13/16 2141)   KQA:[id number], albuterol, ALPRAZolam, fluticasone  Assesment:She was admitted with acute on chronic hypoxic/hypercapnic respiratory failure and with COPD exacerbation. She is being appropriately treated with IV antibiotics IV steroids inhaled bronchodilators and BiPAP. I think she is better. She says she still pretty tired. She desaturated some yesterday when she came off the BiPAP. Principal Problem:   Acute respiratory failure with hypercapnia (HCC) Active Problems:   Hyperlipidemia   Acute on chronic respiratory failure (HCC)   Chronic respiratory failure with hypoxia (HCC)   Leukocytosis    Plan: Discussed with Dr. Candiss Norse. I think it's okay to follow her clinically as far as coming off the BiPAP but if she desaturates of course she is going to have to go back on. I added flutter valve for when she is off the BiPAP and Mucomyst since she's having cough that is nonproductive    LOS: 4 days   Max Romano L 05/17/2016, 7:55 AM

## 2016-05-17 NOTE — Evaluation (Signed)
Physical Therapy Evaluation Patient Details Name: [name] MRN: [id number] DOB: August 11, 1955 Today's Date: 05/17/2016   History of Present Illness  61 y.o. female with hx of severe COPD, prior smoker, on 4L  home oxygen, hx of CAD, hx of SVT, HLD, presented to the ER with increase SOB.  She has been on chronic steroid, and  was found to have a marked leukocytosis with WBC of 18K.   Evaluation included a CXR showing no infiltrate, and her bicarb was elevated to 35's.  Her ABG showed 7.2/99/pOx=119 of 50%FIO2. She was placed on Bipap, and repeat ABG did not change significantly.  Consideration for intubation, however, she appeared well and had significant improvement, so it was deferred.  Hospitalist was asked to admit her for further evaluation and tx.  I remember her as I had taken care of her before.  She sees Dr Egbert Garibaldi of pulmonary outpatient, and she had seen Dr Blase Mess shortly during her last bout. We will bring her in for COPD exacerbation, though she was not in significant respiratory distress.  She does use her CPAP/Bipap every night at home.   Clinical Impression  Pt received in bed, on the BiPap, and was agreeable to PT evaluation.  Sister present.  Pt expressed that she is normally independent with ambulation, but uses her Rollator occasionally.  Pt also expressed that she normally is able to perform ADL's, but it is very time consuming and effortful.  During PT evaluation, she was able to perform SPT from bed<>BSC, and then from BSC<>chair with supervision.  Pt was placed on 10L HFNC per RN request, and was able to main SpO2 saturation >90%.  Pt expressed concern for being able to care for herself when she goes home.  Discharge options were discussed, and pt would likely be able to discharge home with HHPT if she is able to have 24/7 supervision/assistance, however if that is not available, then recommend SNF to build up her strength and endurance.      Follow Up Recommendations  Supervision/Assistance - 24 hour;SNF;Home health PT    Equipment Recommendations  Wheelchair (measurements PT);Wheelchair cushion (measurements PT)    Recommendations for Other Services       Precautions / Restrictions Precautions Precautions: None Restrictions Weight Bearing Restrictions: No      Mobility  Bed Mobility Overal bed mobility: Modified Independent                Transfers Overall transfer level: Needs assistance Equipment used: None Transfers: Stand Pivot Transfers   Stand pivot transfers: Supervision       General transfer comment: from the bed<>BSC, and then from the BSC<>chair.  Pt maintained SpO2 saturation >90% during mobility.   Ambulation/Gait                Stairs            Wheelchair Mobility    Modified Rankin (Stroke Patients Only)       Balance Overall balance assessment: No apparent balance deficits (not formally assessed)                                           Pertinent Vitals/Pain Pain Assessment: No/denies pain (no pain currently, but it gets up to 6/10 for back pain. )    Home Living   Living Arrangements: Alone Available Help at Discharge: Family;Friend(s);Available PRN/intermittently Type of Home:  House Home Access: Ramped entrance     Home Layout: One level Home Equipment: Walker - 4 wheels      Prior Function     Gait / Transfers Assistance Needed: Pt states that she occasionally uses her walker for ambulation  ADL's / Homemaking Assistance Needed: Pt states she is independent with ADL's, however it is getting hard and it takes a long time.          Hand Dominance        Extremity/Trunk Assessment   Upper Extremity Assessment Upper Extremity Assessment: Overall WFL for tasks assessed    Lower Extremity Assessment Lower Extremity Assessment: Overall WFL for tasks assessed       Communication   Communication: No difficulties  Cognition Arousal/Alertness:  Awake/alert Behavior During Therapy: WFL for tasks assessed/performed Overall Cognitive Status: Within Functional Limits for tasks assessed                      General Comments      Exercises     Assessment/Plan    PT Assessment Patient needs continued PT services  PT Problem List Decreased activity tolerance;Decreased mobility;Cardiopulmonary status limiting activity          PT Treatment Interventions DME instruction;Gait training;Functional mobility training;Therapeutic activities;Therapeutic exercise    PT Goals (Current goals can be found in the Care Plan section)  Acute Rehab PT Goals Patient Stated Goal: To go home.  PT Goal Formulation: With patient/family Time For Goal Achievement: 05/24/16 Potential to Achieve Goals: Good    Frequency Min 2X/week   Barriers to discharge Decreased caregiver support Pt lives alone    Co-evaluation               End of Session Equipment Utilized During Treatment: Oxygen Activity Tolerance: Patient tolerated treatment well;Patient limited by fatigue Patient left: in chair;with call bell/phone within reach;with family/visitor present Nurse Communication: Mobility status Carollee Herter, RN updated on pt's mobility status and location.  Mobility sheet filled out and left up in the room.  )    Functional Assessment Tool Used: [employer] AM-PAC "6-clicks"  Functional Limitation: Mobility: Walking and moving around Mobility: Walking and Moving Around Current Status [phone]): At least 40 percent but less than 60 percent impaired, limited or restricted Mobility: Walking and Moving Around Goal Status [phone]): At least 20 percent but less than 40 percent impaired, limited or restricted    Time: 1545-1609 PT Time Calculation (min) (ACUTE ONLY): 24 min   Charges:         PT G Codes:   PT G-Codes **NOT FOR INPATIENT CLASS** Functional Assessment Tool Used: [employer] "6-clicks"  Functional Limitation:  Mobility: Walking and moving around Mobility: Walking and Moving Around Current Status [phone]): At least 40 percent but less than 60 percent impaired, limited or restricted Mobility: Walking and Moving Around Goal Status [phone]): At least 20 percent but less than 40 percent impaired, limited or restricted    Beth Marik Sedore, PT, DPT X: [phone]

## 2016-05-17 NOTE — Progress Notes (Signed)
PROGRESS NOTE                                                                                                                                                                                                             Patient Demographics:    [name], is a 61 y.o. female, DOB - 1956/02/17, QIO:[id number]  Admit date - 05/13/2016   Admitting Physician Houston Siren, MD  Outpatient Primary MD for the patient is Cassell Smiles., MD  LOS - 4  Chief Complaint  Patient presents with  . Shortness of Breath       Brief Narrative  61 y.o. female with hx of severe COPD, prior smoker, on 4L [place] home oxygen, hx of CAD, hx of SVT, HLD, presented to the ER with increase SOB. She has been on chronic steroid, and was found to have a marked leukocytosis with WBC of 18K.Evaluation included a CXR showing no infiltrate, and her bicarb was elevated to 35's. Her ABG showed 7.2/99/pOx=119 of 50%FIO2. She was placed on Bipap.   Subjective:    [name] today has, No headache, No chest pain, No abdominal pain - No Nausea, No new weakness tingling or numbness, +ve Cough & SOB but feels better.     Assessment  & Plan :     1.Acute on chronic hypoxic and hypercapnic respiratory failure. In a patient with advanced COPD on 4 L nasal cannula oxygen at home, BiPAP at night at baseline along with chronically on oral steroids, who is still smoking.  This is due to COPD exacerbation, pneumonia cannot be entirely ruled out. For now she is on combination of IV steroids, Levaquin, nebulizer treatments and increased oxygen/BiPAP as needed. Pulmonary is following. Patient has been counseled to quit smoking. ABG trend is better, she was compensated around PCO2 of 70 in the past. Continue the present BiPAP PRN in am and scheduled QHS. Repeat 2 view chest x-ray was stable.   2.Smoking - counseled to quit.  3.Weakness - increase activity, PT.  4.  Anxiety and depression. Continue home regimen of benzodiazepines.  5.Dyslipidemia - on statin  6.Hx of CAD and SVT - stable, pain-free, aspirin, Cardizem and statin combination which will be continued.    Family Communication  :  None  Code Status :  Full  Diet : Diet Heart Room service appropriate?  Yes; Fluid consistency: Thin   Disposition Plan  :  Stepdown  Consults  :  Pulm  Procedures  :    DVT Prophylaxis  :  Lovenox    Lab Results  Component Value Date   PLT 368 05/16/2016    Inpatient Medications  Scheduled Meds: . acetylcysteine  4 mL Nebulization Q4H  . aspirin  81 mg Oral Daily  . atorvastatin  40 mg Oral QPM  . chlorpheniramine-HYDROcodone  5 mL Oral Once  . clonazePAM  0.5-0.75 mg Oral QHS  . diltiazem  180 mg Oral Daily  . enoxaparin (LOVENOX) injection  40 mg Subcutaneous Q24H  . escitalopram  20 mg Oral Daily  . [START ON 05/20/2016] fluconazole  100 mg Oral Q Mon-1800  . guaiFENesin  1,200 mg Oral BID  . ipratropium-albuterol  3 mL Nebulization Q4H  . levofloxacin (LEVAQUIN) IV  750 mg Intravenous Q24H  . mouth rinse  15 mL Mouth Rinse BID  . methylPREDNISolone (SOLU-MEDROL) injection  60 mg Intravenous TID  . multivitamin with minerals  1 tablet Oral Daily  . oxybutynin  5 mg Oral Daily  . roflumilast  500 mcg Oral Daily  . sodium chloride flush  3 mL Intravenous Q12H   Continuous Infusions: . albuterol Stopped (05/13/16 2141)   PRN Meds:.acetaminophen, albuterol, ALPRAZolam, fluticasone  Antibiotics  :    Anti-infectives    Start     Dose/Rate Route Frequency Ordered Stop   05/20/16 1800  fluconazole (DIFLUCAN) tablet 100 mg     100 mg Oral Every Mon (1800) 05/13/16 2310     05/13/16 2315  levofloxacin (LEVAQUIN) IVPB 750 mg     750 mg 100 mL/hr over 90 Minutes Intravenous Every 24 hours 05/13/16 2310     05/13/16 2130  vancomycin (VANCOCIN) IVPB 1000 mg/200 mL premix     1,000 mg 200 mL/hr over 60 Minutes Intravenous  Once 05/13/16  2115 05/13/16 2232   05/13/16 2130  levofloxacin (LEVAQUIN) IVPB 750 mg     750 mg 100 mL/hr over 90 Minutes Intravenous  Once 05/13/16 2115 05/13/16 2324         Objective:   Vitals:   05/17/16 0800 05/17/16 0801 05/17/16 0806 05/17/16 0926  BP:    131/76  Pulse:   90   Resp:   17   Temp: 98.3 F (36.8 C)     TempSrc: Axillary     SpO2:  95% 95%   Weight:      Height:        Wt Readings from Last 3 Encounters:  05/17/16 54.5 kg (120 lb 2.4 oz)  02/29/16 54 kg (119 lb)  06/21/15 52.2 kg (115 lb)     Intake/Output Summary (Last 24 hours) at 05/17/16 0956 Last data filed at 05/17/16 0930  Gross per 24 hour  Intake              633 ml  Output             1750 ml  Net            -1117 ml     Physical Exam  Awake Alert, Oriented X 3, No new F.N deficits, Normal affect [place].AT,PERRAL Supple Neck,No JVD, No cervical lymphadenopathy appriciated.  Symmetrical Chest wall movement, Mild to Mod air movement bilaterally, Bilat wheezing RRR,No Gallops,Rubs or new Murmurs, No Parasternal Heave +ve B.Sounds, Abd Soft, No tenderness, No organomegaly appriciated, No rebound - guarding or rigidity. No Cyanosis, Clubbing or  edema, No new Rash or bruise       Data Review:    CBC  Recent Labs Lab 05/13/16 1929 05/15/16 0433 05/16/16 0439  WBC 17.9* 16.3* 17.6*  HGB 12.7 11.8* 11.8*  HCT 39.1 38.7 38.3  PLT 363 366 368  MCV 100.8* 103.8* 103.5*  MCH 32.7 31.6 31.9  MCHC 32.5 30.5 30.8  RDW 12.5 12.1 11.9  LYMPHSABS 1.8  --   --   MONOABS 0.7  --   --   EOSABS 0.0  --   --   BASOSABS 0.0  --   --     Chemistries   Recent Labs Lab 05/13/16 1929 05/14/16 0540 05/15/16 0433 05/16/16 0439  NA 137 139 140 138  K 4.3 4.4 3.9 4.3  CL 94* 95* 93* 93*  CO2 35* 38* 40* 41*  GLUCOSE 192* 143* 152* 172*  BUN 13 12 17 19   CREATININE 0.78 0.69 0.64 0.72  CALCIUM 8.9 8.7* 8.6* 8.4*  AST 23  --   --   --   ALT 19  --   --   --   ALKPHOS 88  --   --   --   BILITOT  0.3  --   --   --    ------------------------------------------------------------------------------------------------------------------ No results for input(s): CHOL, HDL, LDLCALC, TRIG, CHOLHDL, LDLDIRECT in the last 72 hours.  Lab Results  Component Value Date   HGBA1C 6.0 (H) 11/14/2014   ------------------------------------------------------------------------------------------------------------------ No results for input(s): TSH, T4TOTAL, T3FREE, THYROIDAB in the last 72 hours.  Invalid input(s): FREET3 ------------------------------------------------------------------------------------------------------------------ No results for input(s): VITAMINB12, FOLATE, FERRITIN, TIBC, IRON, RETICCTPCT in the last 72 hours.  Coagulation profile No results for input(s): INR, PROTIME in the last 168 hours.  No results for input(s): DDIMER in the last 72 hours.  Cardiac Enzymes  Recent Labs Lab 05/13/16 1929  TROPONINI <0.03   ------------------------------------------------------------------------------------------------------------------    Component Value Date/Time   BNP 67.0 05/13/2016 1929    Micro Results Recent Results (from the past 240 hour(s))  MRSA PCR Screening     Status: None   Collection Time: 05/14/16  5:40 PM  Result Value Ref Range Status   MRSA by PCR NEGATIVE NEGATIVE Final    Comment:        The GeneXpert MRSA Assay (FDA approved for NASAL specimens only), is one component of a comprehensive MRSA colonization surveillance program. It is not intended to diagnose MRSA infection nor to guide or monitor treatment for MRSA infections.   Respiratory Panel by PCR     Status: None   Collection Time: 05/15/16 10:54 AM  Result Value Ref Range Status   Adenovirus NOT DETECTED NOT DETECTED Final   Coronavirus 229E NOT DETECTED NOT DETECTED Final   Coronavirus HKU1 NOT DETECTED NOT DETECTED Final   Coronavirus NL63 NOT DETECTED NOT DETECTED Final   Coronavirus  OC43 NOT DETECTED NOT DETECTED Final   Metapneumovirus NOT DETECTED NOT DETECTED Final   Rhinovirus / Enterovirus NOT DETECTED NOT DETECTED Final   Influenza A NOT DETECTED NOT DETECTED Final   Influenza B NOT DETECTED NOT DETECTED Final   Parainfluenza Virus 1 NOT DETECTED NOT DETECTED Final   Parainfluenza Virus 2 NOT DETECTED NOT DETECTED Final   Parainfluenza Virus 3 NOT DETECTED NOT DETECTED Final   Parainfluenza Virus 4 NOT DETECTED NOT DETECTED Final   Respiratory Syncytial Virus NOT DETECTED NOT DETECTED Final   Bordetella pertussis NOT DETECTED NOT DETECTED Final   Chlamydophila pneumoniae NOT DETECTED  NOT DETECTED Final   Mycoplasma pneumoniae NOT DETECTED NOT DETECTED Final    Comment: Performed at [facility]    Radiology Reports Dg Chest 1 View  Result Date: 05/13/2016 CLINICAL DATA:  Shortness of breath.  Ex-smoker. EXAM: CHEST 1 VIEW COMPARISON:  01/24/2015 FINDINGS: Two frontal radiographs. Midline trachea. Lower cervical spine fixation. Normal heart size. Age advanced aortic atherosclerosis. No definite pleural fluid. No pneumothorax. 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 Scattered lower lobe predominant granulomas bilaterally. Hazy opacities project over both lower lobes. At least partially felt to be due to overlying breast tissue. Improved aeration on the right since the prior frontal radiograph of 11/14/2014. IMPRESSION: Bullous type emphysema. Increased density projecting over the lung bases is at least partially felt to be due to overlying breast tissue and AP portable technique. Probable concurrent scarring. Cannot exclude mild bibasilar pneumonia. Consider further evaluation with PA and lateral radiographs. Age advanced aortic atherosclerosis. Electronically Signed   By: Jeronimo Greaves M.D.   On: 05/13/2016 19:56   Dg Chest 2 View  Result Date: 05/15/2016 CLINICAL DATA:  Cough, shortness of breath. EXAM: CHEST  2 VIEW COMPARISON:  Radiographs of May 13, 2016. FINDINGS: Stable cardiomediastinal silhouette. Atherosclerosis of thoracic aorta is noted. Stable small calcified granulomata are noted bilaterally. Emphysematous disease is noted in the upper lobes bilaterally. No pneumothorax or pleural effusion is noted. No acute pulmonary disease is noted. Bony thorax is unremarkable. IMPRESSION: No active cardiopulmonary disease. Aortic atherosclerosis. Stable emphysematous disease. Electronically Signed   By: Lupita Raider, M.D.   On: 05/15/2016 10:42    Time Spent in minutes  30   Beverly Suriano K M.D on 05/17/2016 at 9:56 AM  Between 7am to 7pm - Pager - [phone]  After 7pm go to [email or web address] - password [facility]  [facility] -  Office  [phone]

## 2016-05-18 NOTE — Plan of Care (Signed)
Problem: Activity: Goal: Risk for activity intolerance will decrease Outcome: Not Progressing PT CONTINUES TO BE UNABLE TO TOLERATE GETTING OOB TO CHAIR W/O BECOMING SEVERELY SOB AND ANXIOUS.

## 2016-05-18 NOTE — Progress Notes (Signed)
Patient tolerating sitting in chair for 1-2 hrs this am, then requested to get back in bed. Refused to get back in chair for lunch/dinner, sat on side of bed. Remains on HFNC. Georgina Peer, RN 05/18/2016 5:30 PM

## 2016-05-18 NOTE — Plan of Care (Signed)
Problem: Skin Integrity: Goal: Risk for impaired skin integrity will decrease Outcome: Progressing NO BROKEN OR REDDENED SKIN AREAS

## 2016-05-18 NOTE — Progress Notes (Signed)
Subjective: She was admitted for acute on chronic hypoxic/hypercapnic respiratory failure. She has been on BiPAP pretty much 24 7 since admission. She has been off mostly to eat. This morning she is off BiPAP but since she's very weak and very short of breath with minimal exertion. She's coughing still. This is mostly nonproductive.  Objective: Vital signs in last 24 hours: Temp:  [97.5 F (36.4 C)-98.7 F (37.1 C)] 98.7 F (37.1 C) (01/13 0739) Pulse Rate:  [73-96] 78 (01/13 0727) Resp:  [16-28] 27 (01/13 0340) BP: (108-142)/(62-78) 126/75 (01/13 0500) SpO2:  [85 %-99 %] 97 % (01/13 0727) FiO2 (%):  [40 %] 40 % (01/13 0726) Weight:  [54.9 kg (121 lb 0.5 oz)] 54.9 kg (121 lb 0.5 oz) (01/13 0400) Weight change: 0.4 kg (14.1 oz) Last BM Date: 05/17/16  Intake/Output from previous day: 01/12 0701 - 01/13 0700 In: 403 [P.O.:240; I.V.:13; IV Piggyback:150] Out: 1250 [Urine:1250]  PHYSICAL EXAM General appearance: alert, cooperative and moderate distress Resp: Decreased breath sounds and prolonged expiratory phase bilaterally Cardio: regular rate and rhythm, S1, S2 normal, no murmur, click, rub or gallop GI: soft, non-tender; bowel sounds normal; no masses,  no organomegaly Extremities: extremities normal, atraumatic, no cyanosis or edema Pupils react. Mucous membranes are moist  Lab Results:  No results found for this or any previous visit (from the past 48 hour(s)).  ABGS No results for input(s): PHART, PO2ART, TCO2, HCO3 in the last 72 hours.  Invalid input(s): PCO2 CULTURES Recent Results (from the past 240 hour(s))  MRSA PCR Screening     Status: None   Collection Time: 05/14/16  5:40 PM  Result Value Ref Range Status   MRSA by PCR NEGATIVE NEGATIVE Final    Comment:        The GeneXpert MRSA Assay (FDA approved for NASAL specimens only), is one component of a comprehensive MRSA colonization surveillance program. It is not intended to diagnose MRSA infection nor to  guide or monitor treatment for MRSA infections.   Respiratory Panel by PCR     Status: None   Collection Time: 05/15/16 10:54 AM  Result Value Ref Range Status   Adenovirus NOT DETECTED NOT DETECTED Final   Coronavirus 229E NOT DETECTED NOT DETECTED Final   Coronavirus HKU1 NOT DETECTED NOT DETECTED Final   Coronavirus NL63 NOT DETECTED NOT DETECTED Final   Coronavirus OC43 NOT DETECTED NOT DETECTED Final   Metapneumovirus NOT DETECTED NOT DETECTED Final   Rhinovirus / Enterovirus NOT DETECTED NOT DETECTED Final   Influenza A NOT DETECTED NOT DETECTED Final   Influenza B NOT DETECTED NOT DETECTED Final   Parainfluenza Virus 1 NOT DETECTED NOT DETECTED Final   Parainfluenza Virus 2 NOT DETECTED NOT DETECTED Final   Parainfluenza Virus 3 NOT DETECTED NOT DETECTED Final   Parainfluenza Virus 4 NOT DETECTED NOT DETECTED Final   Respiratory Syncytial Virus NOT DETECTED NOT DETECTED Final   Bordetella pertussis NOT DETECTED NOT DETECTED Final   Chlamydophila pneumoniae NOT DETECTED NOT DETECTED Final   Mycoplasma pneumoniae NOT DETECTED NOT DETECTED Final    Comment: Performed at [facility]   Studies/Results: No results found.  Medications:  Prior to Admission:  Prescriptions Prior to Admission  Medication Sig Dispense Refill Last Dose  . albuterol (PROAIR HFA) 108 (90 BASE) MCG/ACT inhaler Inhale 2 puffs into the lungs every 6 (six) hours as needed. For shortness of breath   05/13/2016 at Unknown time  . ALPRAZolam (XANAX) 0.5 MG tablet Take 0.5 mg  by mouth daily as needed. anxiety   05/13/2016 at Unknown time  . aspirin 81 MG tablet Take 81 mg by mouth daily.   05/13/2016 at Unknown time  . atorvastatin (LIPITOR) 40 MG tablet Take 1 tablet (40 mg total) by mouth daily. (Patient taking differently: Take 40 mg by mouth every evening. ) 90 tablet 3 05/12/2016 at Unknown time  . azithromycin (ZITHROMAX) 250 MG tablet Take 250 mg by mouth every Monday, Wednesday, and Friday.    05/10/2016   . cetirizine (ZYRTEC) 10 MG tablet Take 10 mg by mouth daily.     05/13/2016 at Unknown time  . clonazePAM (KLONOPIN) 0.5 MG tablet Take 0.5-0.75 mg by mouth at bedtime.    05/12/2016 at Unknown time  . diltiazem (CARDIZEM CD) 180 MG 24 hr capsule Take 1 capsule (180 mg total) by mouth daily. 90 capsule 3 05/13/2016 at Unknown time  . escitalopram (LEXAPRO) 20 MG tablet Take 20 mg by mouth daily.     05/13/2016 at Unknown time  . fluconazole (DIFLUCAN) 150 MG tablet Take 150 mg by mouth once a week.    05/13/2016 at Unknown time  . Fluticasone-Salmeterol (ADVAIR) 250-50 MCG/DOSE AEPB Inhale 1 puff into the lungs 2 (two) times daily.    05/13/2016 at Unknown time  . guaiFENesin (MUCINEX) 600 MG 12 hr tablet Take 1 tablet (600 mg total) by mouth 2 (two) times daily. 20 tablet 0 05/13/2016 at Unknown time  . ibuprofen (ADVIL,MOTRIN) 200 MG tablet Take 400 mg by mouth 2 (two) times daily as needed for mild pain or moderate pain.   05/13/2016 at Unknown time  . ipratropium-albuterol (DUONEB) 0.5-2.5 (3) MG/3ML SOLN Take 3 mLs by nebulization 3 (three) times daily. 360 mL 1 05/13/2016 at Unknown time  . levofloxacin (LEVAQUIN) 750 MG tablet Take 750 mg by mouth daily.   05/13/2016 at Unknown time  . montelukast (SINGULAIR) 10 MG tablet Take 1 tablet (10 mg total) by mouth at bedtime. 30 tablet 1 05/12/2016 at Unknown time  . Multiple Vitamins-Minerals (MULTIVITAMINS THER. W/MINERALS) TABS Take 1 tablet by mouth daily.     05/13/2016 at Unknown time  . oxybutynin (DITROPAN) 5 MG tablet Take 5 mg by mouth daily.    05/13/2016 at Unknown time  . predniSONE (DELTASONE) 10 MG tablet Take 10 mg by mouth every morning.   HOLD  . roflumilast (DALIRESP) 500 MCG TABS tablet Take 500 mcg by mouth daily.     05/13/2016 at Unknown time  . SPIRIVA RESPIMAT 2.5 MCG/ACT AERS Inhale 1 puff into the lungs daily.    05/13/2016 at Unknown time  . triamcinolone (NASACORT ALLERGY 24HR) 55 MCG/ACT AERO nasal inhaler Place 2 sprays into the nose daily as  needed (FOR ALLERGIES).   UNKNOWN  . predniSONE (DELTASONE) 10 MG tablet Take 60 mg x 3 days then 40 mg x 3 days then 30 mg x 3 days then 20mg  x 3 days then resume maintenance dose 10 mg daily (Patient not taking: Reported on 05/13/2016) 60 tablet 2 Not Taking at Unknown time   Scheduled: . acetylcysteine  4 mL Nebulization Q4H  . aspirin  81 mg Oral Daily  . atorvastatin  40 mg Oral QPM  . chlorpheniramine-HYDROcodone  5 mL Oral Once  . clonazePAM  0.5-0.75 mg Oral QHS  . diltiazem  180 mg Oral Daily  . enoxaparin (LOVENOX) injection  40 mg Subcutaneous Q24H  . escitalopram  20 mg Oral Daily  . [START ON 05/20/2016] fluconazole  100 mg Oral Q Mon-1800  . guaiFENesin  1,200 mg Oral BID  . ipratropium-albuterol  3 mL Nebulization Q4H  . levofloxacin (LEVAQUIN) IV  750 mg Intravenous Q24H  . mouth rinse  15 mL Mouth Rinse BID  . methylPREDNISolone (SOLU-MEDROL) injection  60 mg Intravenous TID  . multivitamin with minerals  1 tablet Oral Daily  . oxybutynin  5 mg Oral Daily  . roflumilast  500 mcg Oral Daily  . sodium chloride flush  3 mL Intravenous Q12H   Continuous: . albuterol Stopped (05/13/16 2141)   [id number], albuterol, ALPRAZolam, fluticasone  Assesment:She was admitted with acute on chronic hypoxic/hypercapnic respiratory failure. She has improved and she is off BiPAP right now but she is weak and extremely short of breath with exertion. She had evaluation by physical therapy yesterday and recommendation was either 24 hour care at home or skilled care facility. She says she cannot get 24-hour care at home. Principal Problem:   Acute respiratory failure with hypercapnia (HCC) Active Problems:   Hyperlipidemia   Acute on chronic respiratory failure (HCC)   Chronic respiratory failure with hypoxia (HCC)   Leukocytosis    Plan: Continue treatments. She is still weak and short of breath with exertion    LOS: 5 days   [name] 05/18/2016, 9:40 AM

## 2016-05-18 NOTE — Progress Notes (Signed)
PROGRESS NOTE                                                                                                                                                                                                             Patient Demographics:    [name], is a 62 y.o. female, DOB - 14-Sep-1955, KVQ:[id number]  Admit date - 05/13/2016   Admitting Physician Houston Siren, MD  Outpatient Primary MD for the patient is Cassell Smiles., MD  LOS - 5  Chief Complaint  Patient presents with  . Shortness of Breath       Brief Narrative  61 y.o. female with hx of severe COPD, prior smoker, on 4L [place] home oxygen, hx of CAD, hx of SVT, HLD, presented to the ER with increase SOB. She has been on chronic steroid, and was found to have a marked leukocytosis with WBC of 18K.Evaluation included a CXR showing no infiltrate, and her bicarb was elevated to 35's. Her ABG showed 7.2/99/pOx=119 of 50%FIO2. She was placed on Bipap.   Subjective:    [name] today has, No headache, No chest pain, No abdominal pain - No Nausea, No new weakness tingling or numbness, +ve Cough & SOB but feels better.     Assessment  & Plan :     1.Acute on chronic hypoxic and hypercapnic respiratory failure. In a patient with advanced COPD on 4 L nasal cannula oxygen at home, BiPAP at night at baseline along with chronically on oral steroids, who is still smoking.  This is due to COPD exacerbation, pneumonia cannot be entirely ruled out. For now she is on combination of IV steroids, Levaquin, nebulizer treatments and increased oxygen/BiPAP as needed. Pulmonary is following. Patient has been counseled to quit smoking. ABG trend is better, she was compensated around PCO2 of 70 in the past. Continue the present BiPAP PRN in am and scheduled QHS. Repeat 2 view chest x-ray was stable.   2.Smoking - counseled to quit.  3.Weakness - increase activity, PT.  4.  Anxiety and depression. Continue home regimen of benzodiazepines.  5.Dyslipidemia - on statin  6.Hx of CAD and SVT - stable, pain-free, aspirin, Cardizem and statin combination which will be continued.    Family Communication  :  None  Code Status :  Full  Diet : Diet Heart Room service appropriate?  Yes; Fluid consistency: Thin   Disposition Plan  :  Stepdown  Consults  :  Pulm  Procedures  :    DVT Prophylaxis  :  Lovenox    Lab Results  Component Value Date   PLT 368 05/16/2016    Inpatient Medications  Scheduled Meds: . acetylcysteine  4 mL Nebulization Q4H  . aspirin  81 mg Oral Daily  . atorvastatin  40 mg Oral QPM  . chlorpheniramine-HYDROcodone  5 mL Oral Once  . clonazePAM  0.5-0.75 mg Oral QHS  . diltiazem  180 mg Oral Daily  . enoxaparin (LOVENOX) injection  40 mg Subcutaneous Q24H  . escitalopram  20 mg Oral Daily  . [START ON 05/20/2016] fluconazole  100 mg Oral Q Mon-1800  . guaiFENesin  1,200 mg Oral BID  . ipratropium-albuterol  3 mL Nebulization Q4H  . levofloxacin (LEVAQUIN) IV  750 mg Intravenous Q24H  . mouth rinse  15 mL Mouth Rinse BID  . methylPREDNISolone (SOLU-MEDROL) injection  60 mg Intravenous TID  . multivitamin with minerals  1 tablet Oral Daily  . oxybutynin  5 mg Oral Daily  . roflumilast  500 mcg Oral Daily  . sodium chloride flush  3 mL Intravenous Q12H   Continuous Infusions: . albuterol Stopped (05/13/16 2141)   PRN Meds:.acetaminophen, albuterol, ALPRAZolam, fluticasone  Antibiotics  :    Anti-infectives    Start     Dose/Rate Route Frequency Ordered Stop   05/20/16 1800  fluconazole (DIFLUCAN) tablet 100 mg     100 mg Oral Every Mon (1800) 05/13/16 2310     05/13/16 2315  levofloxacin (LEVAQUIN) IVPB 750 mg     750 mg 100 mL/hr over 90 Minutes Intravenous Every 24 hours 05/13/16 2310     05/13/16 2130  vancomycin (VANCOCIN) IVPB 1000 mg/200 mL premix     1,000 mg 200 mL/hr over 60 Minutes Intravenous  Once 05/13/16  2115 05/13/16 2232   05/13/16 2130  levofloxacin (LEVAQUIN) IVPB 750 mg     750 mg 100 mL/hr over 90 Minutes Intravenous  Once 05/13/16 2115 05/13/16 2324         Objective:   Vitals:   05/18/16 0500 05/18/16 0726 05/18/16 0727 05/18/16 0739  BP: 126/75     Pulse: 82  78   Resp:      Temp:    98.7 F (37.1 C)  TempSrc:    Axillary  SpO2: 94% 97% 97%   Weight:      Height:        Wt Readings from Last 3 Encounters:  05/18/16 54.9 kg (121 lb 0.5 oz)  02/29/16 54 kg (119 lb)  06/21/15 52.2 kg (115 lb)     Intake/Output Summary (Last 24 hours) at 05/18/16 0911 Last data filed at 05/18/16 0400  Gross per 24 hour  Intake              403 ml  Output              500 ml  Net              -97 ml     Physical Exam  Awake Alert, Oriented X 3, No new F.N deficits, Normal affect [place].AT,PERRAL Supple Neck,No JVD, No cervical lymphadenopathy appriciated.  Symmetrical Chest wall movement, Mild to Mod air movement bilaterally, Bilat wheezing RRR,No Gallops,Rubs or new Murmurs, No Parasternal Heave +ve B.Sounds, Abd Soft, No tenderness, No organomegaly appriciated, No rebound - guarding or rigidity. No  Cyanosis, Clubbing or edema, No new Rash or bruise       Data Review:    CBC  Recent Labs Lab 05/13/16 1929 05/15/16 0433 05/16/16 0439  WBC 17.9* 16.3* 17.6*  HGB 12.7 11.8* 11.8*  HCT 39.1 38.7 38.3  PLT 363 366 368  MCV 100.8* 103.8* 103.5*  MCH 32.7 31.6 31.9  MCHC 32.5 30.5 30.8  RDW 12.5 12.1 11.9  LYMPHSABS 1.8  --   --   MONOABS 0.7  --   --   EOSABS 0.0  --   --   BASOSABS 0.0  --   --     Chemistries   Recent Labs Lab 05/13/16 1929 05/14/16 0540 05/15/16 0433 05/16/16 0439  NA 137 139 140 138  K 4.3 4.4 3.9 4.3  CL 94* 95* 93* 93*  CO2 35* 38* 40* 41*  GLUCOSE 192* 143* 152* 172*  BUN 13 12 17 19   CREATININE 0.78 0.69 0.64 0.72  CALCIUM 8.9 8.7* 8.6* 8.4*  AST 23  --   --   --   ALT 19  --   --   --   ALKPHOS 88  --   --   --   BILITOT  0.3  --   --   --    ------------------------------------------------------------------------------------------------------------------ No results for input(s): CHOL, HDL, LDLCALC, TRIG, CHOLHDL, LDLDIRECT in the last 72 hours.  Lab Results  Component Value Date   HGBA1C 6.0 (H) 11/14/2014   ------------------------------------------------------------------------------------------------------------------ No results for input(s): TSH, T4TOTAL, T3FREE, THYROIDAB in the last 72 hours.  Invalid input(s): FREET3 ------------------------------------------------------------------------------------------------------------------ No results for input(s): VITAMINB12, FOLATE, FERRITIN, TIBC, IRON, RETICCTPCT in the last 72 hours.  Coagulation profile No results for input(s): INR, PROTIME in the last 168 hours.  No results for input(s): DDIMER in the last 72 hours.  Cardiac Enzymes  Recent Labs Lab 05/13/16 1929  TROPONINI <0.03   ------------------------------------------------------------------------------------------------------------------    Component Value Date/Time   BNP 67.0 05/13/2016 1929    Micro Results Recent Results (from the past 240 hour(s))  MRSA PCR Screening     Status: None   Collection Time: 05/14/16  5:40 PM  Result Value Ref Range Status   MRSA by PCR NEGATIVE NEGATIVE Final    Comment:        The GeneXpert MRSA Assay (FDA approved for NASAL specimens only), is one component of a comprehensive MRSA colonization surveillance program. It is not intended to diagnose MRSA infection nor to guide or monitor treatment for MRSA infections.   Respiratory Panel by PCR     Status: None   Collection Time: 05/15/16 10:54 AM  Result Value Ref Range Status   Adenovirus NOT DETECTED NOT DETECTED Final   Coronavirus 229E NOT DETECTED NOT DETECTED Final   Coronavirus HKU1 NOT DETECTED NOT DETECTED Final   Coronavirus NL63 NOT DETECTED NOT DETECTED Final   Coronavirus  OC43 NOT DETECTED NOT DETECTED Final   Metapneumovirus NOT DETECTED NOT DETECTED Final   Rhinovirus / Enterovirus NOT DETECTED NOT DETECTED Final   Influenza A NOT DETECTED NOT DETECTED Final   Influenza B NOT DETECTED NOT DETECTED Final   Parainfluenza Virus 1 NOT DETECTED NOT DETECTED Final   Parainfluenza Virus 2 NOT DETECTED NOT DETECTED Final   Parainfluenza Virus 3 NOT DETECTED NOT DETECTED Final   Parainfluenza Virus 4 NOT DETECTED NOT DETECTED Final   Respiratory Syncytial Virus NOT DETECTED NOT DETECTED Final   Bordetella pertussis NOT DETECTED NOT DETECTED Final   Chlamydophila  pneumoniae NOT DETECTED NOT DETECTED Final   Mycoplasma pneumoniae NOT DETECTED NOT DETECTED Final    Comment: Performed at [facility]    Radiology Reports Dg Chest 1 View  Result Date: 05/13/2016 CLINICAL DATA:  Shortness of breath.  Ex-smoker. EXAM: CHEST 1 VIEW COMPARISON:  01/24/2015 FINDINGS: Two frontal radiographs. Midline trachea. Lower cervical spine fixation. Normal heart size. Age advanced aortic atherosclerosis. No definite pleural fluid. No pneumothorax. 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 Scattered lower lobe predominant granulomas bilaterally. Hazy opacities project over both lower lobes. At least partially felt to be due to overlying breast tissue. Improved aeration on the right since the prior frontal radiograph of 11/14/2014. IMPRESSION: Bullous type emphysema. Increased density projecting over the lung bases is at least partially felt to be due to overlying breast tissue and AP portable technique. Probable concurrent scarring. Cannot exclude mild bibasilar pneumonia. Consider further evaluation with PA and lateral radiographs. Age advanced aortic atherosclerosis. Electronically Signed   By: Jeronimo Greaves M.D.   On: 05/13/2016 19:56   Dg Chest 2 View  Result Date: 05/15/2016 CLINICAL DATA:  Cough, shortness of breath. EXAM: CHEST  2 VIEW COMPARISON:  Radiographs of May 13, 2016. FINDINGS: Stable cardiomediastinal silhouette. Atherosclerosis of thoracic aorta is noted. Stable small calcified granulomata are noted bilaterally. Emphysematous disease is noted in the upper lobes bilaterally. No pneumothorax or pleural effusion is noted. No acute pulmonary disease is noted. Bony thorax is unremarkable. IMPRESSION: No active cardiopulmonary disease. Aortic atherosclerosis. Stable emphysematous disease. Electronically Signed   By: Lupita Raider, M.D.   On: 05/15/2016 10:42    Time Spent in minutes  30   Bear Osten K M.D on 05/18/2016 at 9:11 AM  Between 7am to 7pm - Pager - [phone]  After 7pm go to [email or web address] - password [facility]  [facility] -  Office  [phone]

## 2016-05-19 NOTE — Progress Notes (Signed)
Subjective: She says she is still very short of breath with minimal exertion. She's weak. She is anxious. No other new complaints. She's coughing mostly nonproductively but occasionally gets something up. No abdominal pain nausea vomiting chest pain or hemoptysis  Objective: Vital signs in last 24 hours: Temp:  [97.7 F (36.5 C)-98 F (36.7 C)] 97.7 F (36.5 C) (01/14 0400) Pulse Rate:  [81-101] 90 (01/14 0800) Resp:  [15-29] 18 (01/14 0800) BP: (111-165)/(62-89) 141/89 (01/14 0800) SpO2:  [88 %-100 %] 95 % (01/14 0925) FiO2 (%):  [40 %] 40 % (01/14 0440) Weight:  [53.1 kg (117 lb 1 oz)] 53.1 kg (117 lb 1 oz) (01/14 0500) Weight change: -1.8 kg (-3 lb 15.5 oz) Last BM Date: 05/17/16  Intake/Output from previous day: 01/13 0701 - 01/14 0700 In: 993 [P.O.:840; I.V.:3; IV Piggyback:150] Out: 2200 [Urine:2200]  PHYSICAL EXAM General appearance: alert, cooperative and mild distress Resp: rhonchi bilaterally Cardio: regular rate and rhythm, S1, S2 normal, no murmur, click, rub or gallop GI: soft, non-tender; bowel sounds normal; no masses,  no organomegaly Extremities: extremities normal, atraumatic, no cyanosis or edema Skin warm and dry. His membranes are moist  Lab Results:  No results found for this or any previous visit (from the past 48 hour(s)).  ABGS No results for input(s): PHART, PO2ART, TCO2, HCO3 in the last 72 hours.  Invalid input(s): PCO2 CULTURES Recent Results (from the past 240 hour(s))  MRSA PCR Screening     Status: None   Collection Time: 05/14/16  5:40 PM  Result Value Ref Range Status   MRSA by PCR NEGATIVE NEGATIVE Final    Comment:        The GeneXpert MRSA Assay (FDA approved for NASAL specimens only), is one component of a comprehensive MRSA colonization surveillance program. It is not intended to diagnose MRSA infection nor to guide or monitor treatment for MRSA infections.   Respiratory Panel by PCR     Status: None   Collection Time:  05/15/16 10:54 AM  Result Value Ref Range Status   Adenovirus NOT DETECTED NOT DETECTED Final   Coronavirus 229E NOT DETECTED NOT DETECTED Final   Coronavirus HKU1 NOT DETECTED NOT DETECTED Final   Coronavirus NL63 NOT DETECTED NOT DETECTED Final   Coronavirus OC43 NOT DETECTED NOT DETECTED Final   Metapneumovirus NOT DETECTED NOT DETECTED Final   Rhinovirus / Enterovirus NOT DETECTED NOT DETECTED Final   Influenza A NOT DETECTED NOT DETECTED Final   Influenza B NOT DETECTED NOT DETECTED Final   Parainfluenza Virus 1 NOT DETECTED NOT DETECTED Final   Parainfluenza Virus 2 NOT DETECTED NOT DETECTED Final   Parainfluenza Virus 3 NOT DETECTED NOT DETECTED Final   Parainfluenza Virus 4 NOT DETECTED NOT DETECTED Final   Respiratory Syncytial Virus NOT DETECTED NOT DETECTED Final   Bordetella pertussis NOT DETECTED NOT DETECTED Final   Chlamydophila pneumoniae NOT DETECTED NOT DETECTED Final   Mycoplasma pneumoniae NOT DETECTED NOT DETECTED Final    Comment: Performed at [facility]   Studies/Results: No results found.  Medications:  Prior to Admission:  Prescriptions Prior to Admission  Medication Sig Dispense Refill Last Dose  . albuterol (PROAIR HFA) 108 (90 BASE) MCG/ACT inhaler Inhale 2 puffs into the lungs every 6 (six) hours as needed. For shortness of breath   05/13/2016 at Unknown time  . ALPRAZolam (XANAX) 0.5 MG tablet Take 0.5 mg by mouth daily as needed. anxiety   05/13/2016 at Unknown time  . aspirin 81 MG  tablet Take 81 mg by mouth daily.   05/13/2016 at Unknown time  . atorvastatin (LIPITOR) 40 MG tablet Take 1 tablet (40 mg total) by mouth daily. (Patient taking differently: Take 40 mg by mouth every evening. ) 90 tablet 3 05/12/2016 at Unknown time  . azithromycin (ZITHROMAX) 250 MG tablet Take 250 mg by mouth every Monday, Wednesday, and Friday.    05/10/2016  . cetirizine (ZYRTEC) 10 MG tablet Take 10 mg by mouth daily.     05/13/2016 at Unknown time  . clonazePAM  (KLONOPIN) 0.5 MG tablet Take 0.5-0.75 mg by mouth at bedtime.    05/12/2016 at Unknown time  . diltiazem (CARDIZEM CD) 180 MG 24 hr capsule Take 1 capsule (180 mg total) by mouth daily. 90 capsule 3 05/13/2016 at Unknown time  . escitalopram (LEXAPRO) 20 MG tablet Take 20 mg by mouth daily.     05/13/2016 at Unknown time  . fluconazole (DIFLUCAN) 150 MG tablet Take 150 mg by mouth once a week.    05/13/2016 at Unknown time  . Fluticasone-Salmeterol (ADVAIR) 250-50 MCG/DOSE AEPB Inhale 1 puff into the lungs 2 (two) times daily.    05/13/2016 at Unknown time  . guaiFENesin (MUCINEX) 600 MG 12 hr tablet Take 1 tablet (600 mg total) by mouth 2 (two) times daily. 20 tablet 0 05/13/2016 at Unknown time  . ibuprofen (ADVIL,MOTRIN) 200 MG tablet Take 400 mg by mouth 2 (two) times daily as needed for mild pain or moderate pain.   05/13/2016 at Unknown time  . ipratropium-albuterol (DUONEB) 0.5-2.5 (3) MG/3ML SOLN Take 3 mLs by nebulization 3 (three) times daily. 360 mL 1 05/13/2016 at Unknown time  . levofloxacin (LEVAQUIN) 750 MG tablet Take 750 mg by mouth daily.   05/13/2016 at Unknown time  . montelukast (SINGULAIR) 10 MG tablet Take 1 tablet (10 mg total) by mouth at bedtime. 30 tablet 1 05/12/2016 at Unknown time  . Multiple Vitamins-Minerals (MULTIVITAMINS THER. W/MINERALS) TABS Take 1 tablet by mouth daily.     05/13/2016 at Unknown time  . oxybutynin (DITROPAN) 5 MG tablet Take 5 mg by mouth daily.    05/13/2016 at Unknown time  . predniSONE (DELTASONE) 10 MG tablet Take 10 mg by mouth every morning.   HOLD  . roflumilast (DALIRESP) 500 MCG TABS tablet Take 500 mcg by mouth daily.     05/13/2016 at Unknown time  . SPIRIVA RESPIMAT 2.5 MCG/ACT AERS Inhale 1 puff into the lungs daily.    05/13/2016 at Unknown time  . triamcinolone (NASACORT ALLERGY 24HR) 55 MCG/ACT AERO nasal inhaler Place 2 sprays into the nose daily as needed (FOR ALLERGIES).   UNKNOWN  . predniSONE (DELTASONE) 10 MG tablet Take 60 mg x 3 days then 40 mg x 3  days then 30 mg x 3 days then 20mg  x 3 days then resume maintenance dose 10 mg daily (Patient not taking: Reported on 05/13/2016) 60 tablet 2 Not Taking at Unknown time   Scheduled: . acetylcysteine  4 mL Nebulization Q4H  . aspirin  81 mg Oral Daily  . atorvastatin  40 mg Oral QPM  . chlorpheniramine-HYDROcodone  5 mL Oral Once  . clonazePAM  0.5-0.75 mg Oral QHS  . diltiazem  180 mg Oral Daily  . enoxaparin (LOVENOX) injection  40 mg Subcutaneous Q24H  . escitalopram  20 mg Oral Daily  . [START ON 05/20/2016] fluconazole  100 mg Oral Q Mon-1800  . guaiFENesin  1,200 mg Oral BID  . ipratropium-albuterol  3 mL Nebulization Q4H  . levofloxacin (LEVAQUIN) IV  750 mg Intravenous Q24H  . mouth rinse  15 mL Mouth Rinse BID  . methylPREDNISolone (SOLU-MEDROL) injection  60 mg Intravenous TID  . multivitamin with minerals  1 tablet Oral Daily  . oxybutynin  5 mg Oral Daily  . roflumilast  500 mcg Oral Daily  . sodium chloride flush  3 mL Intravenous Q12H   Continuous: . albuterol Stopped (05/13/16 2141)   [id number], albuterol, ALPRAZolam, fluticasone  Assesment:She was admitted with acute on chronic hypercapnic/hypoxic respiratory failure. She has COPD exacerbation. She is slowly improving. She is still very short of breath with minimal exertion. She is still quite anxious. Principal Problem:   Acute respiratory failure with hypercapnia (HCC) Active Problems:   Hyperlipidemia   Acute on chronic respiratory failure (HCC)   Chronic respiratory failure with hypoxia (HCC)   Leukocytosis    Plan: Continue current treatments.    LOS: 6 days   Keishla Oyer L 05/19/2016, 10:01 AM

## 2016-05-19 NOTE — Progress Notes (Signed)
PROGRESS NOTE                                                                                                                                                                                                             Patient Demographics:    [name], is a 61 y.o. female, DOB - [birth year], UEA:[id number]  Admit date - 05/13/2016   Admitting Physician Houston Siren, MD  Outpatient Primary MD for the patient is Cassell Smiles., MD  LOS - 6  Chief Complaint  Patient presents with  . Shortness of Breath       Brief Narrative  61 y.o. female with hx of severe COPD, prior smoker, on 4L [place] home oxygen, hx of CAD, hx of SVT, HLD, presented to the ER with increase SOB. She has been on chronic steroid, and was found to have a marked leukocytosis with WBC of 18K.Evaluation included a CXR showing no infiltrate, and her bicarb was elevated to 35's. Her ABG showed 7.2/99/pOx=119 of 50%FIO2. She was placed on Bipap.   Subjective:    Floride Cravotta today has, No headache, No chest pain, No abdominal pain - No Nausea, No new weakness tingling or numbness, +ve Cough & SOB but feels better.     Assessment  & Plan :     1.Acute on chronic hypoxic and hypercapnic respiratory failure. In a patient with advanced COPD on 4 L nasal cannula oxygen at home, BiPAP at night at baseline along with chronically on oral steroids, who is still smoking.  This is due to COPD exacerbation, pneumonia cannot be entirely ruled out. For now she is on combination of IV steroids, Levaquin, nebulizer treatments and increased oxygen/BiPAP as needed. Pulmonary is following and defer management of this issue to Pulmonary.   Patient has been counseled to quit smoking, have requested her several times to sit in chair for Pulm toiletry or else she will develop atelectasis and Pneumonia on top of terminal COPD, but she is non complaint of my recommendations.   ABG  trend is better, she was compensated around PCO2 of 70 in the past. Continue the present BiPAP PRN in am and scheduled QHS.     2.Smoking - counseled to quit.  3.Weakness - increase activity, PT.  4. Anxiety and depression. Continue home regimen of benzodiazepines.  5.Dyslipidemia - on statin  6.Hx of CAD and SVT - stable, pain-free, aspirin, Cardizem and statin combination which will be continued.    Family Communication  :  None  Code Status :  Full  Diet : Diet Heart Room service appropriate? Yes; Fluid consistency: Thin   Disposition Plan  :  Stepdown  Consults  :  Pulm  Procedures  :    DVT Prophylaxis  :  Lovenox    Lab Results  Component Value Date   PLT 368 05/16/2016    Inpatient Medications  Scheduled Meds: . acetylcysteine  4 mL Nebulization Q4H  . aspirin  81 mg Oral Daily  . atorvastatin  40 mg Oral QPM  . chlorpheniramine-HYDROcodone  5 mL Oral Once  . clonazePAM  0.5-0.75 mg Oral QHS  . diltiazem  180 mg Oral Daily  . enoxaparin (LOVENOX) injection  40 mg Subcutaneous Q24H  . escitalopram  20 mg Oral Daily  . [START ON 05/20/2016] fluconazole  100 mg Oral Q Mon-1800  . guaiFENesin  1,200 mg Oral BID  . ipratropium-albuterol  3 mL Nebulization Q4H  . levofloxacin (LEVAQUIN) IV  750 mg Intravenous Q24H  . mouth rinse  15 mL Mouth Rinse BID  . methylPREDNISolone (SOLU-MEDROL) injection  60 mg Intravenous TID  . multivitamin with minerals  1 tablet Oral Daily  . oxybutynin  5 mg Oral Daily  . roflumilast  500 mcg Oral Daily  . sodium chloride flush  3 mL Intravenous Q12H   Continuous Infusions: . albuterol Stopped (05/13/16 2141)   PRN Meds:.acetaminophen, albuterol, ALPRAZolam, fluticasone  Antibiotics  :    Anti-infectives    Start     Dose/Rate Route Frequency Ordered Stop   05/20/16 1800  fluconazole (DIFLUCAN) tablet 100 mg     100 mg Oral Every Mon (1800) 05/13/16 2310     05/13/16 2315  levofloxacin (LEVAQUIN) IVPB 750 mg     750  mg 100 mL/hr over 90 Minutes Intravenous Every 24 hours 05/13/16 2310     05/13/16 2130  vancomycin (VANCOCIN) IVPB 1000 mg/200 mL premix     1,000 mg 200 mL/hr over 60 Minutes Intravenous  Once 05/13/16 2115 05/13/16 2232   05/13/16 2130  levofloxacin (LEVAQUIN) IVPB 750 mg     750 mg 100 mL/hr over 90 Minutes Intravenous  Once 05/13/16 2115 05/13/16 2324         Objective:   Vitals:   05/19/16 0600 05/19/16 0631 05/19/16 0800 05/19/16 0925  BP: (!) 145/80  (!) 141/89   Pulse: 88 82 90   Resp: 20 16 18    Temp:      TempSrc:      SpO2: 100% 100% 95% 95%  Weight:      Height:        Wt Readings from Last 3 Encounters:  05/19/16 53.1 kg (117 lb 1 oz)  02/29/16 54 kg (119 lb)  06/21/15 52.2 kg (115 lb)     Intake/Output Summary (Last 24 hours) at 05/19/16 0948 Last data filed at 05/19/16 0500  Gross per 24 hour  Intake              753 ml  Output             2200 ml  Net            -1447 ml     Physical Exam  Awake Alert, Oriented X 3, No new F.N deficits, Normal affect [place].AT,PERRAL Supple Neck,No JVD, No cervical lymphadenopathy appriciated.  Symmetrical Chest wall movement, Mild to Mod air movement bilaterally, Bilat wheezing RRR,No Gallops,Rubs or new Murmurs, No Parasternal Heave +ve B.Sounds, Abd Soft, No tenderness, No organomegaly appriciated, No rebound - guarding or rigidity. No Cyanosis, Clubbing or edema, No new Rash or bruise       Data Review:    CBC  Recent Labs Lab 05/13/16 1929 05/15/16 0433 05/16/16 0439  WBC 17.9* 16.3* 17.6*  HGB 12.7 11.8* 11.8*  HCT 39.1 38.7 38.3  PLT 363 366 368  MCV 100.8* 103.8* 103.5*  MCH 32.7 31.6 31.9  MCHC 32.5 30.5 30.8  RDW 12.5 12.1 11.9  LYMPHSABS 1.8  --   --   MONOABS 0.7  --   --   EOSABS 0.0  --   --   BASOSABS 0.0  --   --     Chemistries   Recent Labs Lab 05/13/16 1929 05/14/16 0540 05/15/16 0433 05/16/16 0439  NA 137 139 140 138  K 4.3 4.4 3.9 4.3  CL 94* 95* 93* 93*  CO2 35*  38* 40* 41*  GLUCOSE 192* 143* 152* 172*  BUN 13 12 17 19   CREATININE 0.78 0.69 0.64 0.72  CALCIUM 8.9 8.7* 8.6* 8.4*  AST 23  --   --   --   ALT 19  --   --   --   ALKPHOS 88  --   --   --   BILITOT 0.3  --   --   --    ------------------------------------------------------------------------------------------------------------------ No results for input(s): CHOL, HDL, LDLCALC, TRIG, CHOLHDL, LDLDIRECT in the last 72 hours.  Lab Results  Component Value Date   HGBA1C 6.0 (H) 11/14/2014   ------------------------------------------------------------------------------------------------------------------ No results for input(s): TSH, T4TOTAL, T3FREE, THYROIDAB in the last 72 hours.  Invalid input(s): FREET3 ------------------------------------------------------------------------------------------------------------------ No results for input(s): VITAMINB12, FOLATE, FERRITIN, TIBC, IRON, RETICCTPCT in the last 72 hours.  Coagulation profile No results for input(s): INR, PROTIME in the last 168 hours.  No results for input(s): DDIMER in the last 72 hours.  Cardiac Enzymes  Recent Labs Lab 05/13/16 1929  TROPONINI <0.03   ------------------------------------------------------------------------------------------------------------------    Component Value Date/Time   BNP 67.0 05/13/2016 1929    Micro Results Recent Results (from the past 240 hour(s))  MRSA PCR Screening     Status: None   Collection Time: 05/14/16  5:40 PM  Result Value Ref Range Status   MRSA by PCR NEGATIVE NEGATIVE Final    Comment:        The GeneXpert MRSA Assay (FDA approved for NASAL specimens only), is one component of a comprehensive MRSA colonization surveillance program. It is not intended to diagnose MRSA infection nor to guide or monitor treatment for MRSA infections.   Respiratory Panel by PCR     Status: None   Collection Time: 05/15/16 10:54 AM  Result Value Ref Range Status    Adenovirus NOT DETECTED NOT DETECTED Final   Coronavirus 229E NOT DETECTED NOT DETECTED Final   Coronavirus HKU1 NOT DETECTED NOT DETECTED Final   Coronavirus NL63 NOT DETECTED NOT DETECTED Final   Coronavirus OC43 NOT DETECTED NOT DETECTED Final   Metapneumovirus NOT DETECTED NOT DETECTED Final   Rhinovirus / Enterovirus NOT DETECTED NOT DETECTED Final   Influenza A NOT DETECTED NOT DETECTED Final   Influenza B NOT DETECTED NOT DETECTED Final   Parainfluenza Virus 1 NOT DETECTED NOT DETECTED Final   Parainfluenza Virus 2 NOT DETECTED NOT DETECTED Final   Parainfluenza Virus 3 NOT  DETECTED NOT DETECTED Final   Parainfluenza Virus 4 NOT DETECTED NOT DETECTED Final   Respiratory Syncytial Virus NOT DETECTED NOT DETECTED Final   Bordetella pertussis NOT DETECTED NOT DETECTED Final   Chlamydophila pneumoniae NOT DETECTED NOT DETECTED Final   Mycoplasma pneumoniae NOT DETECTED NOT DETECTED Final    Comment: Performed at [facility]    Radiology Reports Dg Chest 1 View  Result Date: 05/13/2016 CLINICAL DATA:  Shortness of breath.  Ex-smoker. EXAM: CHEST 1 VIEW COMPARISON:  01/24/2015 FINDINGS: Two frontal radiographs. Midline trachea. Lower cervical spine fixation. Normal heart size. Age advanced aortic atherosclerosis. No definite pleural fluid. No pneumothorax. 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 Scattered lower lobe predominant granulomas bilaterally. Hazy opacities project over both lower lobes. At least partially felt to be due to overlying breast tissue. Improved aeration on the right since the prior frontal radiograph of 11/14/2014. IMPRESSION: Bullous type emphysema. Increased density projecting over the lung bases is at least partially felt to be due to overlying breast tissue and AP portable technique. Probable concurrent scarring. Cannot exclude mild bibasilar pneumonia. Consider further evaluation with PA and lateral radiographs. Age advanced aortic atherosclerosis.  Electronically Signed   By: Jeronimo Greaves M.D.   On: 05/13/2016 19:56   Dg Chest 2 View  Result Date: 05/15/2016 CLINICAL DATA:  Cough, shortness of breath. EXAM: CHEST  2 VIEW COMPARISON:  Radiographs of May 13, 2016. FINDINGS: Stable cardiomediastinal silhouette. Atherosclerosis of thoracic aorta is noted. Stable small calcified granulomata are noted bilaterally. Emphysematous disease is noted in the upper lobes bilaterally. No pneumothorax or pleural effusion is noted. No acute pulmonary disease is noted. Bony thorax is unremarkable. IMPRESSION: No active cardiopulmonary disease. Aortic atherosclerosis. Stable emphysematous disease. Electronically Signed   By: Lupita Raider, M.D.   On: 05/15/2016 10:42    Time Spent in minutes  30   SINGH,PRASHANT K M.D on 05/19/2016 at 9:48 AM  Between 7am to 7pm - Pager - [phone]  After 7pm go to [email or web address] - password [facility]  [facility] -  Office  [phone]

## 2016-05-20 ENCOUNTER — Inpatient Hospital Stay (HOSPITAL_COMMUNITY): Payer: Medicare Other

## 2016-05-20 LAB — BASIC METABOLIC PANEL
ANION GAP: 5 (ref 5–15)
BUN: 19 mg/dL (ref 6–20)
CO2: 45 mmol/L — AB (ref 22–32)
Calcium: 8.1 mg/dL — ABNORMAL LOW (ref 8.9–10.3)
Chloride: 88 mmol/L — ABNORMAL LOW (ref 101–111)
Creatinine, Ser: 0.55 mg/dL (ref 0.44–1.00)
GFR calc Af Amer: >60 mL/min (ref >60)
GFR calc non Af Amer: >60 mL/min (ref >60)
GLUCOSE: 176 mg/dL — AB (ref 65–99)
POTASSIUM: 4.3 mmol/L (ref 3.5–5.1)
Sodium: 138 mmol/L (ref 135–145)

## 2016-05-20 LAB — CBC
HCT: 42.5 % (ref 36.0–46.0)
Hemoglobin: 13.3 g/dL (ref 12.0–15.0)
MCH: 33.1 pg (ref 26.0–34.0)
MCHC: 31.3 g/dL (ref 30.0–36.0)
MCV: 105.7 fL — AB (ref 78.0–100.0)
Platelets: 381 10*3/uL (ref 150–400)
RBC: 4.02 MIL/uL (ref 3.87–5.11)
RDW: 12.4 % (ref 11.5–15.5)
WBC: 19.7 10*3/uL — AB (ref 4.0–10.5)

## 2016-05-20 MED ORDER — METHYLPREDNISOLONE SODIUM SUCC 125 MG IJ SOLR
60.0000 mg | Freq: Every day | INTRAMUSCULAR | Status: DC
  Administered 2016-05-20: 60 mg via INTRAVENOUS
  Filled 2016-05-20: qty 2

## 2016-05-20 MED ORDER — LEVOFLOXACIN 750 MG PO TABS
750.0000 mg | ORAL_TABLET | Freq: Every day | ORAL | Status: DC
  Administered 2016-05-20 – 2016-05-23 (×3): 750 mg via ORAL
  Filled 2016-05-20 (×3): qty 1

## 2016-05-20 NOTE — Progress Notes (Signed)
PROGRESS NOTE                                                                                                                                                                                                             Patient Demographics:    [name], is a 61 y.o. female, DOB - 1955/07/17, OVF:[id number]  Admit date - 05/13/2016   Admitting Physician Houston Siren, MD  Outpatient Primary MD for the patient is Cassell Smiles., MD  LOS - 7  Chief Complaint  Patient presents with  . Shortness of Breath       Brief Narrative  61 y.o. female with hx of severe COPD, prior smoker, on 4L [place] home oxygen, hx of CAD, hx of SVT, HLD, presented to the ER with increase SOB. She has been on chronic steroid, and was found to have a marked leukocytosis with WBC of 18K.Evaluation included a CXR showing no infiltrate, and her bicarb was elevated to 35's. Her ABG showed 7.2/99/pOx=119 of 50%FIO2. She was placed on Bipap.   Subjective:    [name] today has, No headache, No chest pain, No abdominal pain - No Nausea, No new weakness tingling or numbness, +ve Cough & SOB but feels better.     Assessment  & Plan :     1.Acute on chronic hypoxic and hypercapnic respiratory failure. In a patient with advanced COPD on 4-5 L nasal cannula oxygen at home, BiPAP at night and whenever she is in bed and daytime at baseline along with chronically on oral steroids, who is still smoking.  This is due to COPD exacerbation, pneumonia cannot be entirely ruled out. She is showing signs of improvement and will start titrating down IV steroids, continue Levaquin, nebulizer treatments and increased oxygen/BiPAP as needed which she uses at home on when necessary basis as well. Pulmonary is following and defer management of this issue to Pulmonary. Discussed with pulmonary on 05/20/2016 likely stable for SNF discharge on 05/21/2016.  Patient has been  counseled to quit smoking, have requested her several times to sit in chair for Pulm toiletry or else she will develop atelectasis and Pneumonia on top of terminal COPD, but she is non complaint of my recommendations.   ABG trend is better, she was compensated around PCO2 of 70 in the past. Continue the present BiPAP PRN in  am and scheduled QHS.     2.Smoking - counseled to quit.  3.Weakness - increase activity, PT.  4. Anxiety and depression. Continue home regimen of benzodiazepines.  5.Dyslipidemia - on statin  6.Hx of CAD and SVT - stable, pain-free, aspirin, Cardizem and statin combination which will be continued.    Family Communication  :  Daughter over the phone on 05/19/2016  Code Status :  Full  Diet : Diet Heart Room service appropriate? Yes; Fluid consistency: Thin   Disposition Plan  :  SNF in the morning  Consults  :  Pulm  Procedures  :    DVT Prophylaxis  :  Lovenox    Lab Results  Component Value Date   PLT 381 05/20/2016    Inpatient Medications  Scheduled Meds: . aspirin  81 mg Oral Daily  . atorvastatin  40 mg Oral QPM  . chlorpheniramine-HYDROcodone  5 mL Oral Once  . clonazePAM  0.5-0.75 mg Oral QHS  . diltiazem  180 mg Oral Daily  . enoxaparin (LOVENOX) injection  40 mg Subcutaneous Q24H  . escitalopram  20 mg Oral Daily  . fluconazole  100 mg Oral Q Mon-1800  . guaiFENesin  1,200 mg Oral BID  . ipratropium-albuterol  3 mL Nebulization Q4H  . levofloxacin (LEVAQUIN) IV  750 mg Intravenous Q24H  . mouth rinse  15 mL Mouth Rinse BID  . methylPREDNISolone (SOLU-MEDROL) injection  60 mg Intravenous Daily  . multivitamin with minerals  1 tablet Oral Daily  . oxybutynin  5 mg Oral Daily  . roflumilast  500 mcg Oral Daily  . sodium chloride flush  3 mL Intravenous Q12H   Continuous Infusions: . albuterol Stopped (05/13/16 2141)   PRN Meds:.acetaminophen, albuterol, ALPRAZolam, fluticasone  Antibiotics  :    Anti-infectives    Start      Dose/Rate Route Frequency Ordered Stop   05/20/16 1800  fluconazole (DIFLUCAN) tablet 100 mg     100 mg Oral Every Mon (1800) 05/13/16 2310     05/13/16 2315  levofloxacin (LEVAQUIN) IVPB 750 mg     750 mg 100 mL/hr over 90 Minutes Intravenous Every 24 hours 05/13/16 2310     05/13/16 2130  vancomycin (VANCOCIN) IVPB 1000 mg/200 mL premix     1,000 mg 200 mL/hr over 60 Minutes Intravenous  Once 05/13/16 2115 05/13/16 2232   05/13/16 2130  levofloxacin (LEVAQUIN) IVPB 750 mg     750 mg 100 mL/hr over 90 Minutes Intravenous  Once 05/13/16 2115 05/13/16 2324         Objective:   Vitals:   05/20/16 0500 05/20/16 0600 05/20/16 0734 05/20/16 0743  BP:  (!) 144/90    Pulse: 80 79 79   Resp: (!) 26 (!) 25 20   Temp:   97.4 F (36.3 C)   TempSrc:   Oral   SpO2: 95% (!) 89% 96% 96%  Weight: 55 kg (121 lb 4.1 oz)     Height:        Wt Readings from Last 3 Encounters:  05/20/16 55 kg (121 lb 4.1 oz)  02/29/16 54 kg (119 lb)  06/21/15 52.2 kg (115 lb)     Intake/Output Summary (Last 24 hours) at 05/20/16 0830 Last data filed at 05/20/16 0300  Gross per 24 hour  Intake              753 ml  Output             1650 ml  Net             -897 ml     Physical Exam  Awake Alert, Oriented X 3, No new F.N deficits, Normal affect [place].AT,PERRAL Supple Neck,No JVD, No cervical lymphadenopathy appriciated.  Symmetrical Chest wall movement, Mild to Mod air movement bilaterally, Bilat wheezing RRR,No Gallops,Rubs or new Murmurs, No Parasternal Heave +ve B.Sounds, Abd Soft, No tenderness, No organomegaly appriciated, No rebound - guarding or rigidity. No Cyanosis, Clubbing or edema, No new Rash or bruise       Data Review:    CBC  Recent Labs Lab 05/13/16 1929 05/15/16 0433 05/16/16 0439 05/20/16 0420  WBC 17.9* 16.3* 17.6* 19.7*  HGB 12.7 11.8* 11.8* 13.3  HCT 39.1 38.7 38.3 42.5  PLT 363 366 368 381  MCV 100.8* 103.8* 103.5* 105.7*  MCH 32.7 31.6 31.9 33.1  MCHC 32.5  30.5 30.8 31.3  RDW 12.5 12.1 11.9 12.4  LYMPHSABS 1.8  --   --   --   MONOABS 0.7  --   --   --   EOSABS 0.0  --   --   --   BASOSABS 0.0  --   --   --     Chemistries   Recent Labs Lab 05/13/16 1929 05/14/16 0540 05/15/16 0433 05/16/16 0439 05/20/16 0420  NA 137 139 140 138 138  K 4.3 4.4 3.9 4.3 4.3  CL 94* 95* 93* 93* 88*  CO2 35* 38* 40* 41* 45*  GLUCOSE 192* 143* 152* 172* 176*  BUN 13 12 17 19 19   CREATININE 0.78 0.69 0.64 0.72 0.55  CALCIUM 8.9 8.7* 8.6* 8.4* 8.1*  AST 23  --   --   --   --   ALT 19  --   --   --   --   ALKPHOS 88  --   --   --   --   BILITOT 0.3  --   --   --   --    ------------------------------------------------------------------------------------------------------------------ No results for input(s): CHOL, HDL, LDLCALC, TRIG, CHOLHDL, LDLDIRECT in the last 72 hours.  Lab Results  Component Value Date   HGBA1C 6.0 (H) 11/14/2014   ------------------------------------------------------------------------------------------------------------------ No results for input(s): TSH, T4TOTAL, T3FREE, THYROIDAB in the last 72 hours.  Invalid input(s): FREET3 ------------------------------------------------------------------------------------------------------------------ No results for input(s): VITAMINB12, FOLATE, FERRITIN, TIBC, IRON, RETICCTPCT in the last 72 hours.  Coagulation profile No results for input(s): INR, PROTIME in the last 168 hours.  No results for input(s): DDIMER in the last 72 hours.  Cardiac Enzymes  Recent Labs Lab 05/13/16 1929  TROPONINI <0.03   ------------------------------------------------------------------------------------------------------------------    Component Value Date/Time   BNP 67.0 05/13/2016 1929    Micro Results Recent Results (from the past 240 hour(s))  MRSA PCR Screening     Status: None   Collection Time: 05/14/16  5:40 PM  Result Value Ref Range Status   MRSA by PCR NEGATIVE NEGATIVE  Final    Comment:        The GeneXpert MRSA Assay (FDA approved for NASAL specimens only), is one component of a comprehensive MRSA colonization surveillance program. It is not intended to diagnose MRSA infection nor to guide or monitor treatment for MRSA infections.   Respiratory Panel by PCR     Status: None   Collection Time: 05/15/16 10:54 AM  Result Value Ref Range Status   Adenovirus NOT DETECTED NOT DETECTED Final   Coronavirus 229E NOT DETECTED NOT DETECTED Final   Coronavirus  HKU1 NOT DETECTED NOT DETECTED Final   Coronavirus NL63 NOT DETECTED NOT DETECTED Final   Coronavirus OC43 NOT DETECTED NOT DETECTED Final   Metapneumovirus NOT DETECTED NOT DETECTED Final   Rhinovirus / Enterovirus NOT DETECTED NOT DETECTED Final   Influenza A NOT DETECTED NOT DETECTED Final   Influenza B NOT DETECTED NOT DETECTED Final   Parainfluenza Virus 1 NOT DETECTED NOT DETECTED Final   Parainfluenza Virus 2 NOT DETECTED NOT DETECTED Final   Parainfluenza Virus 3 NOT DETECTED NOT DETECTED Final   Parainfluenza Virus 4 NOT DETECTED NOT DETECTED Final   Respiratory Syncytial Virus NOT DETECTED NOT DETECTED Final   Bordetella pertussis NOT DETECTED NOT DETECTED Final   Chlamydophila pneumoniae NOT DETECTED NOT DETECTED Final   Mycoplasma pneumoniae NOT DETECTED NOT DETECTED Final    Comment: Performed at [facility]    Radiology Reports Dg Chest 1 View  Result Date: 05/13/2016 CLINICAL DATA:  Shortness of breath.  Ex-smoker. EXAM: CHEST 1 VIEW COMPARISON:  01/24/2015 FINDINGS: Two frontal radiographs. Midline trachea. Lower cervical spine fixation. Normal heart size. Age advanced aortic atherosclerosis. No definite pleural fluid. No pneumothorax. 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 Scattered lower lobe predominant granulomas bilaterally. Hazy opacities project over both lower lobes. At least partially felt to be due to overlying breast tissue. Improved aeration on the right  since the prior frontal radiograph of 11/14/2014. IMPRESSION: Bullous type emphysema. Increased density projecting over the lung bases is at least partially felt to be due to overlying breast tissue and AP portable technique. Probable concurrent scarring. Cannot exclude mild bibasilar pneumonia. Consider further evaluation with PA and lateral radiographs. Age advanced aortic atherosclerosis. Electronically Signed   By: Jeronimo Greaves M.D.   On: 05/13/2016 19:56   Dg Chest 2 View  Result Date: 05/15/2016 CLINICAL DATA:  Cough, shortness of breath. EXAM: CHEST  2 VIEW COMPARISON:  Radiographs of May 13, 2016. FINDINGS: Stable cardiomediastinal silhouette. Atherosclerosis of thoracic aorta is noted. Stable small calcified granulomata are noted bilaterally. Emphysematous disease is noted in the upper lobes bilaterally. No pneumothorax or pleural effusion is noted. No acute pulmonary disease is noted. Bony thorax is unremarkable. IMPRESSION: No active cardiopulmonary disease. Aortic atherosclerosis. Stable emphysematous disease. Electronically Signed   By: Lupita Raider, M.D.   On: 05/15/2016 10:42   Dg Chest Port 1 View  Result Date: 05/20/2016 CLINICAL DATA:  Shortness of breath, COPD EXAM: PORTABLE CHEST 1 VIEW COMPARISON:  05/15/2016 FINDINGS: Marked hyperinflation compatible with background COPD/ emphysema. No focal pneumonia, collapse or consolidation. Mild cardiac enlargement without CHF. Stable small calcified granulomata bilaterally. Trachea is midline. Aorta is atherosclerotic. IMPRESSION: Stable hyperinflation compatible with COPD/ emphysema Cardiomegaly without CHF Remote granulomatous disease and thoracic aortic atherosclerosis. Electronically Signed   By: Judie Petit.  Shick M.D.   On: 05/20/2016 08:07    Time Spent in minutes  30   [name] K M.D on 05/20/2016 at 8:30 AM  Between 7am to 7pm - Pager - [phone]  After 7pm go to [email or web address] - password [facility]  [facility] -  Office   [phone]

## 2016-05-20 NOTE — Care Management (Signed)
CM spoke with patient regarding recommendations of SNF vs 24 hour supervision. She states she lives alone and could arrange for somebody to stay with her "a day or two". She is agreeable to SNF. CSW notified.

## 2016-05-20 NOTE — Progress Notes (Signed)
Pharmacy Antibiotic Note  [name] is a 61 y.o. female admitted on 05/13/2016 with pneumonia.  Pharmacy has been consulted for Levaquin oral dosing. Day#8 of Levaquin Plan: Levaquin 750mg  po q24h Discontinue tx as indicated.  Height: 5\' 2"  (157.5 cm) Weight: 121 lb 4.1 oz (55 kg) IBW/kg (Calculated) : 50.1  Temp (24hrs), Avg:97.6 F (36.4 C), Min:97.4 F (36.3 C), Max:97.8 F (36.6 C)   Recent Labs Lab 05/13/16 1929 05/14/16 0540 05/15/16 0433 05/16/16 0439 05/20/16 0420  WBC 17.9*  --  16.3* 17.6* 19.7*  CREATININE 0.78 0.69 0.64 0.72 0.55    Estimated Creatinine Clearance: 60.6 mL/min (by C-G formula based on SCr of 0.55 mg/dL).    Allergies  Allergen Reactions  . Iohexol Anaphylaxis     Desc: IV DYE  ANAPHYLATIC SHOCK X2 ONCE AFTER PREMEDS   . Chantix [Varenicline] Other (See Comments)    PALPITATIONS  . Ketek [Telithromycin]   . Penicillins Other (See Comments)    unknown    Antimicrobials this admission: levaquin 1/8 >>   Dose adjustments this admission: n/a  Microbiology results: 1/9 MRSA PCR: negative  Thank you for allowing pharmacy to be a part of this patient's care. Isac Sarna, BS Pharm D, [place] Clinical Pharmacist Pager [phone] 05/20/2016 8:49 AM

## 2016-05-20 NOTE — Progress Notes (Signed)
Subjective: She is better today. She still very short of breath with minimal exertion and she still very weak but clearly better than yesterday. She is coughing mostly nonproductively. No abdominal pain nausea vomiting chest pain or hemoptysis  Objective: Vital signs in last 24 hours: Temp:  [97.4 F (36.3 C)-97.8 F (36.6 C)] 97.4 F (36.3 C) (01/15 0734) Pulse Rate:  [76-101] 79 (01/15 0734) Resp:  [15-32] 20 (01/15 0734) BP: (122-156)/(69-90) 144/90 (01/15 0600) SpO2:  [89 %-100 %] 96 % (01/15 0743) FiO2 (%):  [40 %] 40 % (01/14 2353) Weight:  [55 kg (121 lb 4.1 oz)] 55 kg (121 lb 4.1 oz) (01/15 0500) Weight change: 1.9 kg (4 lb 3 oz) Last BM Date: 05/17/16  Intake/Output from previous day: 01/14 0701 - 01/15 0700 In: 993 [P.O.:840; I.V.:3; IV Piggyback:150] Out: 1950 [[id number]]  PHYSICAL EXAM General appearance: alert, cooperative and moderate distress Resp: I don't hear any wheezes or rhonchi now. Her breath sounds are somewhat diminished and she has prolonged expiratory phase Cardio: regular rate and rhythm, S1, S2 normal, no murmur, click, rub or gallop GI: soft, non-tender; bowel sounds normal; no masses,  no organomegaly Extremities: extremities normal, atraumatic, no cyanosis or edema Skin warm and dry. Mucous membranes are moist  Lab Results:  Results for orders placed or performed during the hospital encounter of 05/13/16 (from the past 48 hour(s))  CBC     Status: Abnormal   Collection Time: 05/20/16  4:20 AM  Result Value Ref Range   WBC 19.7 (H) 4.0 - 10.5 K/uL   RBC 4.02 3.87 - 5.11 MIL/uL   Hemoglobin 13.3 12.0 - 15.0 g/dL   HCT 42.5 36.0 - 46.0 %   MCV 105.7 (H) 78.0 - 100.0 fL   MCH 33.1 26.0 - 34.0 pg   MCHC 31.3 30.0 - 36.0 g/dL   RDW 12.4 11.5 - 15.5 %   Platelets 381 150 - 400 K/uL  Basic metabolic panel     Status: Abnormal   Collection Time: 05/20/16  4:20 AM  Result Value Ref Range   Sodium 138 135 - 145 mmol/L   Potassium 4.3 3.5 - 5.1  mmol/L   Chloride 88 (L) 101 - 111 mmol/L   CO2 45 (H) 22 - 32 mmol/L   Glucose, Bld 176 (H) 65 - 99 mg/dL   BUN 19 6 - 20 mg/dL   Creatinine, Ser 0.55 0.44 - 1.00 mg/dL   Calcium 8.1 (L) 8.9 - 10.3 mg/dL   GFR calc non Af Amer >60 >60 mL/min   GFR calc Af Amer >60 >60 mL/min    Comment: (NOTE) The eGFR has been calculated using the CKD EPI equation. This calculation has not been validated in all clinical situations. eGFR's persistently <60 mL/min signify possible Chronic Kidney Disease.    Anion gap 5 5 - 15    ABGS No results for input(s): PHART, PO2ART, TCO2, HCO3 in the last 72 hours.  Invalid input(s): PCO2 CULTURES Recent Results (from the past 240 hour(s))  MRSA PCR Screening     Status: None   Collection Time: 05/14/16  5:40 PM  Result Value Ref Range Status   MRSA by PCR NEGATIVE NEGATIVE Final    Comment:        The GeneXpert MRSA Assay (FDA approved for NASAL specimens only), is one component of a comprehensive MRSA colonization surveillance program. It is not intended to diagnose MRSA infection nor to guide or monitor treatment for MRSA infections.   Respiratory  Panel by PCR     Status: None   Collection Time: 05/15/16 10:54 AM  Result Value Ref Range Status   Adenovirus NOT DETECTED NOT DETECTED Final   Coronavirus 229E NOT DETECTED NOT DETECTED Final   Coronavirus HKU1 NOT DETECTED NOT DETECTED Final   Coronavirus NL63 NOT DETECTED NOT DETECTED Final   Coronavirus OC43 NOT DETECTED NOT DETECTED Final   Metapneumovirus NOT DETECTED NOT DETECTED Final   Rhinovirus / Enterovirus NOT DETECTED NOT DETECTED Final   Influenza A NOT DETECTED NOT DETECTED Final   Influenza B NOT DETECTED NOT DETECTED Final   Parainfluenza Virus 1 NOT DETECTED NOT DETECTED Final   Parainfluenza Virus 2 NOT DETECTED NOT DETECTED Final   Parainfluenza Virus 3 NOT DETECTED NOT DETECTED Final   Parainfluenza Virus 4 NOT DETECTED NOT DETECTED Final   Respiratory Syncytial Virus  NOT DETECTED NOT DETECTED Final   Bordetella pertussis NOT DETECTED NOT DETECTED Final   Chlamydophila pneumoniae NOT DETECTED NOT DETECTED Final   Mycoplasma pneumoniae NOT DETECTED NOT DETECTED Final    Comment: Performed at [facility]   Studies/Results: No results found.  Medications:  Prior to Admission:  Prescriptions Prior to Admission  Medication Sig Dispense Refill Last Dose  . albuterol (PROAIR HFA) 108 (90 BASE) MCG/ACT inhaler Inhale 2 puffs into the lungs every 6 (six) hours as needed. For shortness of breath   05/13/2016 at Unknown time  . ALPRAZolam (XANAX) 0.5 MG tablet Take 0.5 mg by mouth daily as needed. anxiety   05/13/2016 at Unknown time  . aspirin 81 MG tablet Take 81 mg by mouth daily.   05/13/2016 at Unknown time  . atorvastatin (LIPITOR) 40 MG tablet Take 1 tablet (40 mg total) by mouth daily. (Patient taking differently: Take 40 mg by mouth every evening. ) 90 tablet 3 05/12/2016 at Unknown time  . azithromycin (ZITHROMAX) 250 MG tablet Take 250 mg by mouth every Monday, Wednesday, and Friday.    05/10/2016  . cetirizine (ZYRTEC) 10 MG tablet Take 10 mg by mouth daily.     05/13/2016 at Unknown time  . clonazePAM (KLONOPIN) 0.5 MG tablet Take 0.5-0.75 mg by mouth at bedtime.    05/12/2016 at Unknown time  . diltiazem (CARDIZEM CD) 180 MG 24 hr capsule Take 1 capsule (180 mg total) by mouth daily. 90 capsule 3 05/13/2016 at Unknown time  . escitalopram (LEXAPRO) 20 MG tablet Take 20 mg by mouth daily.     05/13/2016 at Unknown time  . fluconazole (DIFLUCAN) 150 MG tablet Take 150 mg by mouth once a week.    05/13/2016 at Unknown time  . Fluticasone-Salmeterol (ADVAIR) 250-50 MCG/DOSE AEPB Inhale 1 puff into the lungs 2 (two) times daily.    05/13/2016 at Unknown time  . guaiFENesin (MUCINEX) 600 MG 12 hr tablet Take 1 tablet (600 mg total) by mouth 2 (two) times daily. 20 tablet 0 05/13/2016 at Unknown time  . ibuprofen (ADVIL,MOTRIN) 200 MG tablet Take 400 mg by mouth 2 (two)  times daily as needed for mild pain or moderate pain.   05/13/2016 at Unknown time  . ipratropium-albuterol (DUONEB) 0.5-2.5 (3) MG/3ML SOLN Take 3 mLs by nebulization 3 (three) times daily. 360 mL 1 05/13/2016 at Unknown time  . levofloxacin (LEVAQUIN) 750 MG tablet Take 750 mg by mouth daily.   05/13/2016 at Unknown time  . montelukast (SINGULAIR) 10 MG tablet Take 1 tablet (10 mg total) by mouth at bedtime. 30 tablet 1 05/12/2016 at Unknown  time  . Multiple Vitamins-Minerals (MULTIVITAMINS THER. W/MINERALS) TABS Take 1 tablet by mouth daily.     05/13/2016 at Unknown time  . oxybutynin (DITROPAN) 5 MG tablet Take 5 mg by mouth daily.    05/13/2016 at Unknown time  . predniSONE (DELTASONE) 10 MG tablet Take 10 mg by mouth every morning.   HOLD  . roflumilast (DALIRESP) 500 MCG TABS tablet Take 500 mcg by mouth daily.     05/13/2016 at Unknown time  . SPIRIVA RESPIMAT 2.5 MCG/ACT AERS Inhale 1 puff into the lungs daily.    05/13/2016 at Unknown time  . triamcinolone (NASACORT ALLERGY 24HR) 55 MCG/ACT AERO nasal inhaler Place 2 sprays into the nose daily as needed (FOR ALLERGIES).   UNKNOWN  . predniSONE (DELTASONE) 10 MG tablet Take 60 mg x 3 days then 40 mg x 3 days then 30 mg x 3 days then 48m x 3 days then resume maintenance dose 10 mg daily (Patient not taking: Reported on 05/13/2016) 60 tablet 2 Not Taking at Unknown time   Scheduled: . aspirin  81 mg Oral Daily  . atorvastatin  40 mg Oral QPM  . chlorpheniramine-HYDROcodone  5 mL Oral Once  . clonazePAM  0.5-0.75 mg Oral QHS  . diltiazem  180 mg Oral Daily  . enoxaparin (LOVENOX) injection  40 mg Subcutaneous Q24H  . escitalopram  20 mg Oral Daily  . fluconazole  100 mg Oral Q Mon-1800  . guaiFENesin  1,200 mg Oral BID  . ipratropium-albuterol  3 mL Nebulization Q4H  . levofloxacin (LEVAQUIN) IV  750 mg Intravenous Q24H  . mouth rinse  15 mL Mouth Rinse BID  . methylPREDNISolone (SOLU-MEDROL) injection  60 mg Intravenous TID  . multivitamin with  minerals  1 tablet Oral Daily  . oxybutynin  5 mg Oral Daily  . roflumilast  500 mcg Oral Daily  . sodium chloride flush  3 mL Intravenous Q12H   Continuous: . albuterol Stopped (05/13/16 2141)   [id number] albuterol, ALPRAZolam, fluticasone  Assesment:She was admitted with acute on chronic hypoxic/hypercapnic respiratory failure with COPD exacerbation. She is slowly improving. At baseline she has severe COPD requiring oxygen and BiPAP at home. Principal Problem:   Acute respiratory failure with hypercapnia (HCC) Active Problems:   Hyperlipidemia   Acute on chronic respiratory failure (HCC)   Chronic respiratory failure with hypoxia (HCC)   Leukocytosis    Plan: Continue efforts to improve her endurance. Reorder flutter valve as I don't think she ever got that    LOS: 7 days   Zhion Pevehouse L 05/20/2016, 7:57 AM

## 2016-05-21 ENCOUNTER — Inpatient Hospital Stay (HOSPITAL_COMMUNITY): Payer: Medicare Other

## 2016-05-21 MED ORDER — DILTIAZEM HCL 25 MG/5ML IV SOLN: 10.0000 mg | Freq: Four times a day (QID) | INTRAVENOUS | Status: DC | PRN

## 2016-05-21 MED ORDER — FUROSEMIDE 10 MG/ML IJ SOLN
40.0000 mg | Freq: Once | INTRAMUSCULAR | Status: AC
  Administered 2016-05-21: 40 mg via INTRAVENOUS
  Filled 2016-05-21: qty 4

## 2016-05-21 MED ORDER — DILTIAZEM HCL 25 MG/5ML IV SOLN
10.0000 mg | Freq: Once | INTRAVENOUS | Status: AC
  Administered 2016-05-21: 10 mg via INTRAVENOUS
  Filled 2016-05-21: qty 5

## 2016-05-21 MED ORDER — MAGNESIUM SULFATE IN D5W 1-5 GM/100ML-% IV SOLN
INTRAVENOUS | Status: AC
  Filled 2016-05-21: qty 100

## 2016-05-21 MED ORDER — CLONAZEPAM 0.5 MG PO TABS: 0.5000 mg | ORAL_TABLET | Freq: Every day | ORAL | 0 refills | Status: DC

## 2016-05-21 MED ORDER — METHYLPREDNISOLONE SODIUM SUCC 125 MG IJ SOLR
60.0000 mg | Freq: Three times a day (TID) | INTRAMUSCULAR | Status: DC
  Administered 2016-05-21 – 2016-05-23 (×6): 60 mg via INTRAVENOUS
  Filled 2016-05-21 (×6): qty 2

## 2016-05-21 MED ORDER — ALPRAZOLAM 0.5 MG PO TABS: 0.5000 mg | ORAL_TABLET | Freq: Every day | ORAL | 0 refills | Status: DC | PRN

## 2016-05-21 MED ORDER — PREDNISONE 20 MG PO TABS
50.0000 mg | ORAL_TABLET | Freq: Every day | ORAL | Status: DC
Start: 2016-05-21 — End: 2016-05-21
  Administered 2016-05-21: 50 mg via ORAL
  Filled 2016-05-21: qty 2

## 2016-05-21 MED ORDER — MAGNESIUM SULFATE IN D5W 1-5 GM/100ML-% IV SOLN
1.0000 g | Freq: Once | INTRAVENOUS | Status: AC
  Administered 2016-05-21: 1 g via INTRAVENOUS
  Filled 2016-05-21: qty 100

## 2016-05-21 MED ORDER — PREDNISONE 5 MG PO TABS
ORAL_TABLET | ORAL | 0 refills | Status: DC
Start: 2016-05-21 — End: 2016-05-27

## 2016-05-21 MED ORDER — PREDNISONE 10 MG PO TABS: 10.0000 mg | ORAL_TABLET | ORAL | Status: DC

## 2016-05-21 MED ORDER — POTASSIUM CHLORIDE CRYS ER 20 MEQ PO TBCR
40.0000 meq | EXTENDED_RELEASE_TABLET | Freq: Once | ORAL | Status: AC
  Administered 2016-05-21: 40 meq via ORAL
  Filled 2016-05-21: qty 2

## 2016-05-21 NOTE — Discharge Summary (Signed)
TYLAYA MONTECALVO WJX:[id number] DOB: 1955/05/29 DOA: 05/13/2016  PCP: Cassell Smiles., MD  Admit date: 05/13/2016  Discharge date: 05/21/2016  Admitted From: Home   Disposition:  SNF   Recommendations for Outpatient Follow-up:   Follow up with PCP in 1-2 weeks  PCP Please obtain BMP/CBC, 2 view CXR in 1week,  (see Discharge instructions)   PCP Please follow up on the following pending results: None   Home Health: None   Equipment/Devices: None  Consultations: Pulm Discharge Condition: Guarded   CODE STATUS: Full   Diet Recommendation:  Heart Healthy    Chief Complaint  Patient presents with  . Shortness of Breath     Brief history of present illness from the day of admission and additional interim summary    61 y.o. female with hx of severe COPD, prior smoker, on 4L [place] home oxygen, hx of CAD, hx of SVT, HLD, presented to the ER with increase SOB. She has been on chronic steroid, and was found to have a marked leukocytosis with WBC of 18K.Evaluation included a CXR showing no infiltrate, and her bicarb was elevated to 35's. Her ABG showed 7.2/99/pOx=119 of 50%FIO2. She was placed on Bipap.  Hospital issues addressed     1.Acute on chronic hypoxic and hypercapnic respiratory failure. In a patient with advanced COPD on 4-5 L nasal cannula oxygen at home, BiPAP at night and whenever she is in bed and daytime at baseline along with chronically on oral steroids, who is still smoking.  This is due to COPD exacerbation,Clinically no pneumonia. She was treated with IV steroids, Levaquin, BiPAP when necessary along with oxygen, note patient uses BiPAP currently 14/7 with 40% FiO2 at nighttime and then at any time in day when she is in bed. This is her baseline. She uses a fullface mask.  Her COPD exacerbation has  improved and she is currently close to her baseline which is BiPAP as described above along with 4-5 nasal cannula oxygen at all times. She has advanced and terminal COPD and unfortunately she still smokes, baseline condition is tenuous and there should be a discussion made about her CODE STATUS with family and future goal of care. In my opinion she would benefit from DO NOT RESUSCITATE and medical treatment only.  She will be discharged on oral steroid taper with outpatient follow-up with PCP and pulmonary, note patient takes 10 mg of prednisone chronically and this should be continued once high-dose prednisone is tapered off. Her home nebulizer treatments have been unchanged. She has been counseled extensively to quit smoking.   Her anxiety plays a large role in her symptoms, continue to manage anxiety per her home medications.    2.Smoking - counseled to quit.  3.Weakness - increase activity, PT.  4. Anxiety and depression. Continue home regimen of benzodiazepines and Lexapro, remains quite anxious at baseline.   5. Dyslipidemia - on statin  6.Hx of CAD and SVT - stable, pain-free, aspirin, Cardizem and statin combination which will be continued.    Discharge diagnosis  Principal Problem:   Acute respiratory failure with hypercapnia (HCC) Active Problems:   Hyperlipidemia   Acute on chronic respiratory failure (HCC)   Chronic respiratory failure with hypoxia (HCC)   Leukocytosis    Discharge instructions    Discharge Instructions    Diet - low sodium heart healthy    Complete by:  As directed    Discharge instructions    Complete by:  As directed    Follow with Primary MD Cassell Smiles., MD in 3-4 days   Get CBC, CMP, 2 view Chest X ray checked  By SNF MD in 2-3 days ( we routinely change or add medications that can affect your baseline labs and fluid status, therefore we recommend that you get the mentioned basic workup next visit with your PCP, your PCP may  decide not to get them or add new tests based on their clinical decision)   Activity: As tolerated with Full fall precautions use walker/cane & assistance as needed   Disposition SNF   Diet:   Heart Healthy    For Heart failure patients - Check your Weight same time everyday, if you gain over 2 pounds, or you develop in leg swelling, experience more shortness of breath or chest pain, call your Primary MD immediately. Follow Cardiac Low Salt Diet and 1.5 lit/day fluid restriction.   On your next visit with your primary care physician please Get Medicines reviewed and adjusted.   Please request your Prim.MD to go over all Hospital Tests and Procedure/Radiological results at the follow up, please get all Hospital records sent to your Prim MD by signing hospital release before you go home.   If you experience worsening of your admission symptoms, develop shortness of breath, life threatening emergency, suicidal or homicidal thoughts you must seek medical attention immediately by calling 911 or calling your MD immediately  if symptoms less severe.  You Must read complete instructions/literature along with all the possible adverse reactions/side effects for all the Medicines you take and that have been prescribed to you. Take any new Medicines after you have completely understood and accpet all the possible adverse reactions/side effects.   Do not drive, operate heavy machinery, perform activities at heights, swimming or participation in water activities or provide baby sitting services if your were admitted for syncope or siezures until you have seen by Primary MD or a Neurologist and advised to do so again.  Do not drive when taking Pain medications.    Do not take more than prescribed Pain, Sleep and Anxiety Medications  Special Instructions: If you have smoked or chewed Tobacco  in the last 2 yrs please stop smoking, stop any regular Alcohol  and or any Recreational drug use.  Wear  Seat belts while driving.   Please note  You were cared for by a hospitalist during your hospital stay. If you have any questions about your discharge medications or the care you received while you were in the hospital after you are discharged, you can call the unit and asked to speak with the hospitalist on call if the hospitalist that took care of you is not available. Once you are discharged, your primary care physician will handle any further medical issues. Please note that NO REFILLS for any discharge medications will be authorized once you are discharged, as it is imperative that you return to your primary care physician (or establish a relationship with a primary care physician if you do not have one) for your aftercare needs so  that they can reassess your need for medications and monitor your lab values.   Increase activity slowly    Complete by:  As directed       Discharge Medications   Allergies as of 05/21/2016      Reactions   Iohexol Anaphylaxis    Desc: IV DYE  ANAPHYLATIC SHOCK X2 ONCE AFTER PREMEDS   Chantix [varenicline] Other (See Comments)   PALPITATIONS   Ketek [telithromycin]    Penicillins Other (See Comments)   unknown      Medication List    STOP taking these medications   azithromycin 250 MG tablet Commonly known as:  ZITHROMAX   levofloxacin 750 MG tablet Commonly known as:  LEVAQUIN   predniSONE 10 MG tablet Commonly known as:  DELTASONE Replaced by:  predniSONE 5 MG tablet You also have another medication with the same name that you need to continue taking as instructed.     TAKE these medications   ALPRAZolam 0.5 MG tablet Commonly known as:  XANAX Take 1 tablet (0.5 mg total) by mouth daily as needed. anxiety   aspirin 81 MG tablet Take 81 mg by mouth daily.   atorvastatin 40 MG tablet Commonly known as:  LIPITOR Take 1 tablet (40 mg total) by mouth daily. What changed:  when to take this   cetirizine 10 MG tablet Commonly known as:   ZYRTEC Take 10 mg by mouth daily.   clonazePAM 0.5 MG tablet Commonly known as:  KLONOPIN Take 1 tablet (0.5 mg total) by mouth at bedtime. What changed:  how much to take   DALIRESP 500 MCG Tabs tablet Generic drug:  roflumilast Take 500 mcg by mouth daily.   diltiazem 180 MG 24 hr capsule Commonly known as:  CARDIZEM CD Take 1 capsule (180 mg total) by mouth daily.   escitalopram 20 MG tablet Commonly known as:  LEXAPRO Take 20 mg by mouth daily.   fluconazole 150 MG tablet Commonly known as:  DIFLUCAN Take 150 mg by mouth once a week.   Fluticasone-Salmeterol 250-50 MCG/DOSE Aepb Commonly known as:  ADVAIR Inhale 1 puff into the lungs 2 (two) times daily.   guaiFENesin 600 MG 12 hr tablet Commonly known as:  MUCINEX Take 1 tablet (600 mg total) by mouth 2 (two) times daily.   ibuprofen 200 MG tablet Commonly known as:  ADVIL,MOTRIN Take 400 mg by mouth 2 (two) times daily as needed for mild pain or moderate pain.   ipratropium-albuterol 0.5-2.5 (3) MG/3ML Soln Commonly known as:  DUONEB Take 3 mLs by nebulization 3 (three) times daily.   montelukast 10 MG tablet Commonly known as:  SINGULAIR Take 1 tablet (10 mg total) by mouth at bedtime.   multivitamins ther. w/minerals Tabs tablet Take 1 tablet by mouth daily.   NASACORT ALLERGY 24HR 55 MCG/ACT Aero nasal inhaler Generic drug:  triamcinolone Place 2 sprays into the nose daily as needed (FOR ALLERGIES).   oxybutynin 5 MG tablet Commonly known as:  DITROPAN Take 5 mg by mouth daily.   predniSONE 5 MG tablet Commonly known as:  DELTASONE Label  & dispense according to the schedule below. 10 Pills PO for 3 days then, 8 Pills PO for 3 days, 6 Pills PO for 3 days, 4 Pills PO for 3 days, then continue your home dose of 10mg  daily as before What changed:  You were already taking a medication with the same name, and this prescription was added. Make sure you understand how and when  to take each. Replaces:   predniSONE 10 MG tablet   predniSONE 10 MG tablet Commonly known as:  DELTASONE Take 1 tablet (10 mg total) by mouth every morning. Start after the high dose course is finished, this is your daily home dose Start taking on:  05/30/2016 What changed:  additional instructions  Another medication with the same name was removed. Continue taking this medication, and follow the directions you see here.   PROAIR HFA 108 (90 Base) MCG/ACT inhaler Generic drug:  albuterol Inhale 2 puffs into the lungs every 6 (six) hours as needed. For shortness of breath   SPIRIVA RESPIMAT 2.5 MCG/ACT Aers Generic drug:  Tiotropium Bromide Monohydrate Inhale 1 puff into the lungs daily.       Follow-up Information    Cassell Smiles., MD. Schedule an appointment as soon as possible for a visit in 3 day(s).   Specialty:  Internal Medicine Contact information: [address] [place] [address] [phone]        Fredirick Maudlin, MD. Schedule an appointment as soon as possible for a visit in 1 week(s).   Specialty:  Pulmonary Disease Contact information: [address] [place] [address] [phone]           Major procedures and Radiology Reports - PLEASE review detailed and final reports thoroughly  -         Dg Chest 1 View  Result Date: 05/13/2016 CLINICAL DATA:  Shortness of breath.  Ex-smoker. EXAM: CHEST 1 VIEW COMPARISON:  01/24/2015 FINDINGS: Two frontal radiographs. Midline trachea. Lower cervical spine fixation. Normal heart size. Age advanced aortic atherosclerosis. No definite pleural fluid. No pneumothorax. 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 Scattered lower lobe predominant granulomas bilaterally. Hazy opacities project over both lower lobes. At least partially felt to be due to overlying breast tissue. Improved aeration on the right since the prior frontal radiograph of 11/14/2014. IMPRESSION: Bullous type emphysema. Increased density  projecting over the lung bases is at least partially felt to be due to overlying breast tissue and AP portable technique. Probable concurrent scarring. Cannot exclude mild bibasilar pneumonia. Consider further evaluation with PA and lateral radiographs. Age advanced aortic atherosclerosis. Electronically Signed   By: Jeronimo Greaves M.D.   On: 05/13/2016 19:56   Dg Chest 2 View  Result Date: 05/15/2016 CLINICAL DATA:  Cough, shortness of breath. EXAM: CHEST  2 VIEW COMPARISON:  Radiographs of May 13, 2016. FINDINGS: Stable cardiomediastinal silhouette. Atherosclerosis of thoracic aorta is noted. Stable small calcified granulomata are noted bilaterally. Emphysematous disease is noted in the upper lobes bilaterally. No pneumothorax or pleural effusion is noted. No acute pulmonary disease is noted. Bony thorax is unremarkable. IMPRESSION: No active cardiopulmonary disease. Aortic atherosclerosis. Stable emphysematous disease. Electronically Signed   By: Lupita Raider, M.D.   On: 05/15/2016 10:42   Dg Chest Port 1 View  Result Date: 05/20/2016 CLINICAL DATA:  Shortness of breath, COPD EXAM: PORTABLE CHEST 1 VIEW COMPARISON:  05/15/2016 FINDINGS: Marked hyperinflation compatible with background COPD/ emphysema. No focal pneumonia, collapse or consolidation. Mild cardiac enlargement without CHF. Stable small calcified granulomata bilaterally. Trachea is midline. Aorta is atherosclerotic. IMPRESSION: Stable hyperinflation compatible with COPD/ emphysema Cardiomegaly without CHF Remote granulomatous disease and thoracic aortic atherosclerosis. Electronically Signed   By: Judie Petit.  Shick M.D.   On: 05/20/2016 08:07    Micro Results     Recent Results (from the past 240 hour(s))  MRSA PCR Screening  Status: None   Collection Time: 05/14/16  5:40 PM  Result Value Ref Range Status   MRSA by PCR NEGATIVE NEGATIVE Final    Comment:        The GeneXpert MRSA Assay (FDA approved for NASAL specimens only), is  one component of a comprehensive MRSA colonization surveillance program. It is not intended to diagnose MRSA infection nor to guide or monitor treatment for MRSA infections.   Respiratory Panel by PCR     Status: None   Collection Time: 05/15/16 10:54 AM  Result Value Ref Range Status   Adenovirus NOT DETECTED NOT DETECTED Final   Coronavirus 229E NOT DETECTED NOT DETECTED Final   Coronavirus HKU1 NOT DETECTED NOT DETECTED Final   Coronavirus NL63 NOT DETECTED NOT DETECTED Final   Coronavirus OC43 NOT DETECTED NOT DETECTED Final   Metapneumovirus NOT DETECTED NOT DETECTED Final   Rhinovirus / Enterovirus NOT DETECTED NOT DETECTED Final   Influenza A NOT DETECTED NOT DETECTED Final   Influenza B NOT DETECTED NOT DETECTED Final   Parainfluenza Virus 1 NOT DETECTED NOT DETECTED Final   Parainfluenza Virus 2 NOT DETECTED NOT DETECTED Final   Parainfluenza Virus 3 NOT DETECTED NOT DETECTED Final   Parainfluenza Virus 4 NOT DETECTED NOT DETECTED Final   Respiratory Syncytial Virus NOT DETECTED NOT DETECTED Final   Bordetella pertussis NOT DETECTED NOT DETECTED Final   Chlamydophila pneumoniae NOT DETECTED NOT DETECTED Final   Mycoplasma pneumoniae NOT DETECTED NOT DETECTED Final    Comment: Performed at [facility]    Today   Subjective    Marceline Mcguigan today has no headache,no chest abdominal pain,no new weakness tingling or numbness, feels much better, remians anxious.   Objective   Blood pressure 132/84, pulse 90, temperature 98.1 F (36.7 C), temperature source Axillary, resp. rate (!) 26, height 5\' 2"  (1.575 m), weight 54.3 kg (119 lb 11.4 oz), SpO2 94 %.   Intake/Output Summary (Last 24 hours) at 05/21/16 0748 Last data filed at 05/21/16 0500  Gross per 24 hour  Intake              600 ml  Output             1900 ml  Net            -1300 ml    Exam Awake Alert, Oriented x 3, No new F.N deficits, ++ anxious affect [place].AT,PERRAL Supple Neck,No JVD, No  cervical lymphadenopathy appriciated.  Symmetrical Chest wall movement, Good air movement bilaterally, mild exp wheezing RRR,No Gallops,Rubs or new Murmurs, No Parasternal Heave +ve B.Sounds, Abd Soft, Non tender, No organomegaly appriciated, No rebound -guarding or rigidity. No Cyanosis, Clubbing or edema, No new Rash or bruise   Data Review   CBC w Diff:  Lab Results  Component Value Date   WBC 19.7 (H) 05/20/2016   HGB 13.3 05/20/2016   HCT 42.5 05/20/2016   HCT 33.2 (L) 01/21/2015   PLT 381 05/20/2016   LYMPHOPCT 10 05/13/2016   MONOPCT 4 05/13/2016   EOSPCT 0 05/13/2016   BASOPCT 0 05/13/2016    CMP:  Lab Results  Component Value Date   NA 138 05/20/2016   K 4.3 05/20/2016   CL 88 (L) 05/20/2016   CO2 45 (H) 05/20/2016   BUN 19 05/20/2016   CREATININE 0.55 05/20/2016   CREATININE 0.83 03/08/2011   PROT 7.2 05/13/2016   ALBUMIN 3.7 05/13/2016   BILITOT 0.3 05/13/2016   ALKPHOS 88 05/13/2016  AST 23 05/13/2016   ALT 19 05/13/2016  .   Total Time in preparing paper work, data evaluation and todays exam - 35 minutes  Leroy Sea M.D on 05/21/2016 at 7:48 AM  [facility]   Office  [phone]

## 2016-05-21 NOTE — Progress Notes (Signed)
Pt's HR per monitor sinus tachycardia 130-140s, MD Candiss Norse notified EKG performed and IV Diltiazem given per MD order.   11:27 Repeat EKG lying in bed per MD Candiss Norse

## 2016-05-21 NOTE — NC FL2 (Deleted)
[place] MEDICAID FL2 LEVEL OF CARE SCREENING TOOL     IDENTIFICATION  Patient Name: [name] Birthdate: 1956-04-22 Sex: female Admission Date (Current Location): 05/13/2016  [facility] and [place] Number:  [employer] and Address:  [facility],  [address] [place] [address]      Provider Number: [id number]  Attending Physician Name and Address:  Leroy Sea, MD  Relative Name and Phone Number:       Current Level of Care: Hospital Recommended Level of Care: Skilled Nursing Facility Prior Approval Number:    Date Approved/Denied:   PASRR Number: [id number] ([id number])  Discharge Plan: SNF    Current Diagnoses: Patient Active Problem List   Diagnosis Date Noted  . Chronic respiratory failure with hypoxia (HCC) 01/21/2015  . Leukocytosis 01/21/2015  . Macrocytic anemia 01/21/2015  . CAP (community acquired pneumonia) 11/19/2014  . Acute on chronic respiratory failure (HCC) 11/19/2014  . Streptococcus pneumoniae pneumonia (HCC) 11/19/2014  . Thrush, oral 11/19/2014  . Respiratory failure with hypercapnia (HCC) 11/14/2014  . COPD exacerbation (HCC) 11/14/2014  . Anemia 11/14/2014  . Tachycardia 11/14/2014  . Acute respiratory failure with hypercapnia (HCC) 11/14/2014  . Chest pain 09/08/2013  . Glucose intolerance (pre-diabetes) 05/24/2012  . COPD (chronic obstructive pulmonary disease) (HCC) 12/23/2011  . 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 Hyperlipidemia 12/23/2011  . Muscle spasms of neck 12/23/2011  . Diverticulitis 02/25/2011    Orientation RESPIRATION BLADDER Height & Weight     Self, Time, Situation, Place  Other (Comment) (HFNC 10L) Continent Weight: 119 lb 11.4 oz (54.3 kg) Height:  5\' 2"  (157.5 cm)  BEHAVIORAL SYMPTOMS/MOOD NEUROLOGICAL BOWEL NUTRITION STATUS      Continent Diet (low sodium heart healthy)  AMBULATORY STATUS COMMUNICATION OF NEEDS Skin   Limited Assist Verbally Normal                        Personal Care Assistance Level of Assistance  Bathing, Feeding, Dressing Bathing Assistance: Limited assistance Feeding assistance: Independent Dressing Assistance: Limited assistance     Functional Limitations Info  Sight, Hearing, Speech Sight Info: Adequate Hearing Info: Adequate Speech Info: Adequate    SPECIAL CARE FACTORS FREQUENCY  PT (By licensed PT)     PT Frequency: Supervision/Assistance - 24 hour;SNF;Home health PT              Contractures Contractures Info: Not present    Additional Factors Info  Code Status, Allergies Code Status Info: Full Allergies Info: Iohexol, Chantix, Ketex, Penicillins           Current Medications (05/21/2016):  This is the current hospital active medication list Current Facility-Administered Medications  Medication Dose Route Frequency Provider Last Rate Last Dose  . acetaminophen (TYLENOL) tablet 650 mg  650 mg Oral Q4H PRN Roma Kayser Schorr, NP   650 mg at 05/20/16 0350  . albuterol (PROVENTIL) (2.5 MG/3ML) 0.083% nebulizer solution 2.5 mg  2.5 mg Nebulization Q2H PRN Houston Siren, MD      . albuterol (PROVENTIL,VENTOLIN) solution continuous neb  10 mg/hr Nebulization Continuous Geoffery Lyons, MD   Stopped at 05/13/16 2141  . ALPRAZolam Prudy Feeler) tablet 0.5 mg  0.5 mg Oral TID PRN Dron Jaynie Collins, MD   0.5 mg at 05/20/16 0939  . aspirin chewable tablet 81 mg  81 mg Oral Daily Houston Siren, MD   81 mg at 05/21/16 1026  . atorvastatin (LIPITOR) tablet 40 mg  40 mg Oral QPM  Houston Siren, MD   40 mg at 05/20/16 1824  . chlorpheniramine-HYDROcodone (TUSSIONEX) 10-8 MG/5ML suspension 5 mL  5 mL Oral Once Leanne Chang, NP      . clonazePAM Scarlette [place]) tablet 0.5-0.75 mg  0.5-0.75 mg Oral QHS Houston Siren, MD   0.75 mg at 05/20/16 2232  . diltiazem (CARDIZEM CD) 24 hr capsule 180 mg  180 mg Oral Daily Houston Siren, MD   180 mg at 05/21/16 1025  . enoxaparin (LOVENOX) injection 40 mg  40 mg Subcutaneous Q24H Houston Siren, MD   40 mg at 05/21/16  0548  . escitalopram (LEXAPRO) tablet 20 mg  20 mg Oral Daily Houston Siren, MD   20 mg at 05/21/16 1025  . fluconazole (DIFLUCAN) tablet 100 mg  100 mg Oral Q Mon-1800 Houston Siren, MD   100 mg at 05/20/16 1824  . fluticasone (FLONASE) 50 MCG/ACT nasal spray 2 spray  2 spray Each Nare Daily PRN Dron Jaynie Collins, MD      . guaiFENesin [place]) 12 hr tablet 1,200 mg  1,200 mg Oral BID Kari Baars, MD   1,200 mg at 05/20/16 2232  . ipratropium-albuterol (DUONEB) 0.5-2.5 (3) MG/3ML nebulizer solution 3 mL  3 mL Nebulization Q4H Dron Jaynie Collins, MD   3 mL at 05/21/16 4132  . levofloxacin (LEVAQUIN) tablet 750 mg  750 mg Oral QHS Leroy Sea, MD   750 mg at 05/20/16 2232  . MEDLINE mouth rinse  15 mL Mouth Rinse BID Leroy Sea, MD   15 mL at 05/21/16 0800  . multivitamin with minerals tablet 1 tablet  1 tablet Oral Daily Houston Siren, MD   1 tablet at 05/21/16 1027  . oxybutynin (DITROPAN) tablet 5 mg  5 mg Oral Daily Houston Siren, MD   5 mg at 05/21/16 1026  . predniSONE (DELTASONE) tablet 50 mg  50 mg Oral Q breakfast Leroy Sea, MD   50 mg at 05/21/16 1024  . roflumilast (DALIRESP) tablet 500 mcg  500 mcg Oral Daily Houston Siren, MD   500 mcg at 05/21/16 1026  . sodium chloride flush (NS) 0.9 % injection 3 mL  3 mL Intravenous Q12H Houston Siren, MD   3 mL at 05/20/16 2233     Discharge Medications: Please see discharge summary for a list of discharge medications.  Relevant Imaging Results:  Relevant Lab Results:   Additional Information SSN [id number], [place], [place]

## 2016-05-21 NOTE — Progress Notes (Signed)
RT note patient placed back to Bipap, sp02 now 93%.

## 2016-05-21 NOTE — Progress Notes (Signed)
RT note- Bipap removed again to use flutter valve. sp02-83% on current 02 at 15l HFNC. Medications given by RN and patient returned to [place], currently EKG being performed.

## 2016-05-21 NOTE — Clinical Social Work Placement (Signed)
CLINICAL SOCIAL WORK PLACEMENT  NOTE  Date:  05/21/2016  Patient Details  Name: [name] MRN: [id number] Date of Birth: 10/29/1955  Clinical Social Work is seeking post-discharge placement for this patient at the Skilled  Nursing Facility level of care (*CSW will initial, date and re-position this form in  chart as items are completed):  Yes   Patient/family provided with [facility] Clinical Social Work Department's list of facilities offering this level of care within the geographic area requested by the patient (or if unable, by the patient's family).  Yes   Patient/family informed of their freedom to choose among providers that offer the needed level of care, that participate in Medicare, Medicaid or managed care program needed by the patient, have an available bed and are willing to accept the patient.  Yes   Patient/family informed of 's ownership interest in [facility] and [facility], as well as of the fact that they are under no obligation to receive care at these facilities.  PASRR submitted to EDS on 05/21/16     PASRR number received on 05/21/16     Existing PASRR number confirmed on       FL2 transmitted to all facilities in geographic area requested by pt/family on 05/21/16     FL2 transmitted to all facilities within larger geographic area on       Patient informed that his/her managed care company has contracts with or will negotiate with certain facilities, including the following:            Patient/family informed of bed offers received.  Patient chooses bed at       Physician recommends and patient chooses bed at      Patient to be transferred to   on  .  Patient to be transferred to facility by       Patient family notified on   of transfer.  Name of family member notified:        PHYSICIAN       Additional Comment:    _______________________________________________ Annice Needy, LCSW 05/21/2016, 2:14 PM

## 2016-05-21 NOTE — Consult Note (Signed)
[facility] CM Inpatient Consult   05/21/2016  KATLAN LESER [birth year] [id number]   Patient screened for potential [facility] Care Management services. Patient is eligible for [facility] Care [facility] Services. Electronic medical record reveals patient's discharge plan is SNF. [facility] Care Management services not appropriate at this time. If patient's post hospital needs change please place a [facility] Care Management consult. For questions please contact:   Lezly Rumpf RN, BSN [facility] Liaison  [phone]) Business Mobile [phone]) Toll free office

## 2016-05-21 NOTE — Progress Notes (Signed)
Patient going to stay off BIPAP unless absolutely needed tonight. RN aware of plan and in agreement.

## 2016-05-21 NOTE — Progress Notes (Signed)
RT note-Placed back to HFNC per MD order with up in chair, patient continues to sp02 at 83%. Increased fio2 to 12L with HFNC. Continue to monitor.

## 2016-05-21 NOTE — Progress Notes (Signed)
Subjective: I saw her about 6:30 this morning. She says she still weak. She still desaturated some when I took her off the BiPAP. She is still short of breath with minimal exertion  Objective: Vital signs in last 24 hours: Temp:  [97.6 F (36.4 C)-98.2 F (36.8 C)] 98.1 F (36.7 C) (01/16 0713) Pulse Rate:  [84-102] 90 (01/16 0713) Resp:  [15-38] 26 (01/16 0713) BP: (132-171)/(70-85) 132/84 (01/16 0600) SpO2:  [89 %-97 %] 94 % (01/16 0713) FiO2 (%):  [40 %] 40 % (01/16 0600) Weight:  [54.3 kg (119 lb 11.4 oz)] 54.3 kg (119 lb 11.4 oz) (01/16 0451) Weight change: -0.7 kg (-1 lb 8.7 oz) Last BM Date: 05/20/16  Intake/Output from previous day: 01/15 0701 - 01/16 0700 In: 600 [P.O.:600] Out: 2050 [Urine:2050]  PHYSICAL EXAM General appearance: alert, cooperative and mild distress Resp: rhonchi bilaterally Cardio: regular rate and rhythm, S1, S2 normal, no murmur, click, rub or gallop GI: soft, non-tender; bowel sounds normal; no masses,  no organomegaly Extremities: extremities normal, atraumatic, no cyanosis or edema Skin warm and dry. Pupils react.  Lab Results:  Results for orders placed or performed during the hospital encounter of 05/13/16 (from the past 48 hour(s))  CBC     Status: Abnormal   Collection Time: 05/20/16  4:20 AM  Result Value Ref Range   WBC 19.7 (H) 4.0 - 10.5 K/uL   RBC 4.02 3.87 - 5.11 MIL/uL   Hemoglobin 13.3 12.0 - 15.0 g/dL   HCT 42.5 36.0 - 46.0 %   MCV 105.7 (H) 78.0 - 100.0 fL   MCH 33.1 26.0 - 34.0 pg   MCHC 31.3 30.0 - 36.0 g/dL   RDW 12.4 11.5 - 15.5 %   Platelets 381 150 - 400 K/uL  Basic metabolic panel     Status: Abnormal   Collection Time: 05/20/16  4:20 AM  Result Value Ref Range   Sodium 138 135 - 145 mmol/L   Potassium 4.3 3.5 - 5.1 mmol/L   Chloride 88 (L) 101 - 111 mmol/L   CO2 45 (H) 22 - 32 mmol/L   Glucose, Bld 176 (H) 65 - 99 mg/dL   BUN 19 6 - 20 mg/dL   Creatinine, Ser 0.55 0.44 - 1.00 mg/dL   Calcium 8.1 (L) 8.9 -  10.3 mg/dL   GFR calc non Af Amer >60 >60 mL/min   GFR calc Af Amer >60 >60 mL/min    Comment: (NOTE) The eGFR has been calculated using the CKD EPI equation. This calculation has not been validated in all clinical situations. eGFR's persistently <60 mL/min signify possible Chronic Kidney Disease.    Anion gap 5 5 - 15    ABGS No results for input(s): PHART, PO2ART, TCO2, HCO3 in the last 72 hours.  Invalid input(s): PCO2 CULTURES Recent Results (from the past 240 hour(s))  MRSA PCR Screening     Status: None   Collection Time: 05/14/16  5:40 PM  Result Value Ref Range Status   MRSA by PCR NEGATIVE NEGATIVE Final    Comment:        The GeneXpert MRSA Assay (FDA approved for NASAL specimens only), is one component of a comprehensive MRSA colonization surveillance program. It is not intended to diagnose MRSA infection nor to guide or monitor treatment for MRSA infections.   Respiratory Panel by PCR     Status: None   Collection Time: 05/15/16 10:54 AM  Result Value Ref Range Status   Adenovirus NOT DETECTED NOT  DETECTED Final   Coronavirus 229E NOT DETECTED NOT DETECTED Final   Coronavirus HKU1 NOT DETECTED NOT DETECTED Final   Coronavirus NL63 NOT DETECTED NOT DETECTED Final   Coronavirus OC43 NOT DETECTED NOT DETECTED Final   Metapneumovirus NOT DETECTED NOT DETECTED Final   Rhinovirus / Enterovirus NOT DETECTED NOT DETECTED Final   Influenza A NOT DETECTED NOT DETECTED Final   Influenza B NOT DETECTED NOT DETECTED Final   Parainfluenza Virus 1 NOT DETECTED NOT DETECTED Final   Parainfluenza Virus 2 NOT DETECTED NOT DETECTED Final   Parainfluenza Virus 3 NOT DETECTED NOT DETECTED Final   Parainfluenza Virus 4 NOT DETECTED NOT DETECTED Final   Respiratory Syncytial Virus NOT DETECTED NOT DETECTED Final   Bordetella pertussis NOT DETECTED NOT DETECTED Final   Chlamydophila pneumoniae NOT DETECTED NOT DETECTED Final   Mycoplasma pneumoniae NOT DETECTED NOT DETECTED  Final    Comment: Performed at [facility]   Studies/Results: Dg Chest Port 1 View  Result Date: 05/20/2016 CLINICAL DATA:  Shortness of breath, COPD EXAM: PORTABLE CHEST 1 VIEW COMPARISON:  05/15/2016 FINDINGS: Marked hyperinflation compatible with background COPD/ emphysema. No focal pneumonia, collapse or consolidation. Mild cardiac enlargement without CHF. Stable small calcified granulomata bilaterally. Trachea is midline. Aorta is atherosclerotic. IMPRESSION: Stable hyperinflation compatible with COPD/ emphysema Cardiomegaly without CHF Remote granulomatous disease and thoracic aortic atherosclerosis. Electronically Signed   By: Jerilynn Mages.  Shick M.D.   On: 05/20/2016 08:07    Medications:  Prior to Admission:  Prescriptions Prior to Admission  Medication Sig Dispense Refill Last Dose  . albuterol (PROAIR HFA) 108 (90 BASE) MCG/ACT inhaler Inhale 2 puffs into the lungs every 6 (six) hours as needed. For shortness of breath   05/13/2016 at Unknown time  . aspirin 81 MG tablet Take 81 mg by mouth daily.   05/13/2016 at Unknown time  . atorvastatin (LIPITOR) 40 MG tablet Take 1 tablet (40 mg total) by mouth daily. (Patient taking differently: Take 40 mg by mouth every evening. ) 90 tablet 3 05/12/2016 at Unknown time  . azithromycin (ZITHROMAX) 250 MG tablet Take 250 mg by mouth every Monday, Wednesday, and Friday.    05/10/2016  . cetirizine (ZYRTEC) 10 MG tablet Take 10 mg by mouth daily.     05/13/2016 at Unknown time  . diltiazem (CARDIZEM CD) 180 MG 24 hr capsule Take 1 capsule (180 mg total) by mouth daily. 90 capsule 3 05/13/2016 at Unknown time  . escitalopram (LEXAPRO) 20 MG tablet Take 20 mg by mouth daily.     05/13/2016 at Unknown time  . fluconazole (DIFLUCAN) 150 MG tablet Take 150 mg by mouth once a week.    05/13/2016 at Unknown time  . Fluticasone-Salmeterol (ADVAIR) 250-50 MCG/DOSE AEPB Inhale 1 puff into the lungs 2 (two) times daily.    05/13/2016 at Unknown time  . guaiFENesin (MUCINEX)  600 MG 12 hr tablet Take 1 tablet (600 mg total) by mouth 2 (two) times daily. 20 tablet 0 05/13/2016 at Unknown time  . ibuprofen (ADVIL,MOTRIN) 200 MG tablet Take 400 mg by mouth 2 (two) times daily as needed for mild pain or moderate pain.   05/13/2016 at Unknown time  . ipratropium-albuterol (DUONEB) 0.5-2.5 (3) MG/3ML SOLN Take 3 mLs by nebulization 3 (three) times daily. 360 mL 1 05/13/2016 at Unknown time  . levofloxacin (LEVAQUIN) 750 MG tablet Take 750 mg by mouth daily.   05/13/2016 at Unknown time  . montelukast (SINGULAIR) 10 MG tablet Take 1 tablet (  10 mg total) by mouth at bedtime. 30 tablet 1 05/12/2016 at Unknown time  . Multiple Vitamins-Minerals (MULTIVITAMINS THER. W/MINERALS) TABS Take 1 tablet by mouth daily.     05/13/2016 at Unknown time  . oxybutynin (DITROPAN) 5 MG tablet Take 5 mg by mouth daily.    05/13/2016 at Unknown time  . roflumilast (DALIRESP) 500 MCG TABS tablet Take 500 mcg by mouth daily.     05/13/2016 at Unknown time  . SPIRIVA RESPIMAT 2.5 MCG/ACT AERS Inhale 1 puff into the lungs daily.    05/13/2016 at Unknown time  . triamcinolone (NASACORT ALLERGY 24HR) 55 MCG/ACT AERO nasal inhaler Place 2 sprays into the nose daily as needed (FOR ALLERGIES).   UNKNOWN  . [DISCONTINUED] ALPRAZolam (XANAX) 0.5 MG tablet Take 0.5 mg by mouth daily as needed. anxiety   05/13/2016 at Unknown time  . [DISCONTINUED] clonazePAM (KLONOPIN) 0.5 MG tablet Take 0.5-0.75 mg by mouth at bedtime.    05/12/2016 at Unknown time  . [DISCONTINUED] predniSONE (DELTASONE) 10 MG tablet Take 10 mg by mouth every morning.   HOLD  . predniSONE (DELTASONE) 10 MG tablet Take 60 mg x 3 days then 40 mg x 3 days then 30 mg x 3 days then '20mg'$  x 3 days then resume maintenance dose 10 mg daily (Patient not taking: Reported on 05/13/2016) 60 tablet 2 Not Taking at Unknown time   Scheduled: . aspirin  81 mg Oral Daily  . atorvastatin  40 mg Oral QPM  . chlorpheniramine-HYDROcodone  5 mL Oral Once  . clonazePAM  0.5-0.75 mg  Oral QHS  . diltiazem  180 mg Oral Daily  . enoxaparin (LOVENOX) injection  40 mg Subcutaneous Q24H  . escitalopram  20 mg Oral Daily  . fluconazole  100 mg Oral Q Mon-1800  . guaiFENesin  1,200 mg Oral BID  . ipratropium-albuterol  3 mL Nebulization Q4H  . levofloxacin  750 mg Oral QHS  . mouth rinse  15 mL Mouth Rinse BID  . multivitamin with minerals  1 tablet Oral Daily  . oxybutynin  5 mg Oral Daily  . predniSONE  50 mg Oral Q breakfast  . roflumilast  500 mcg Oral Daily  . sodium chloride flush  3 mL Intravenous Q12H   Continuous: . albuterol Stopped (05/13/16 2141)   IYM:[id number], albuterol, ALPRAZolam, fluticasone  Assesment:She was admitted with acute hypercapnic hypoxic respiratory failure. She's doing better. She is still dropping her oxygen saturation when she comes off BiPAP this morning. She is requiring fairly high flow oxygen. She is now on oral medications Principal Problem:   Acute respiratory failure with hypercapnia (HCC) Active Problems:   Hyperlipidemia   Acute on chronic respiratory failure (HCC)   Chronic respiratory failure with hypoxia (HCC)   Leukocytosis    Plan: Continue current treatments.    LOS: 8 days   Nate Perri L 05/21/2016, 8:13 AM

## 2016-05-21 NOTE — Discharge Instructions (Signed)
Follow with Primary MD Glo Herring., MD in 3-4 days   Get CBC, CMP, 2 view Chest X ray checked  By SNF MD in 2-3 days ( we routinely change or add medications that can affect your baseline labs and fluid status, therefore we recommend that you get the mentioned basic workup next visit with your PCP, your PCP may decide not to get them or add new tests based on their clinical decision)   Activity: As tolerated with Full fall precautions use walker/cane & assistance as needed   Disposition SNF   Diet:   Heart Healthy    For Heart failure patients - Check your Weight same time everyday, if you gain over 2 pounds, or you develop in leg swelling, experience more shortness of breath or chest pain, call your Primary MD immediately. Follow Cardiac Low Salt Diet and 1.5 lit/day fluid restriction.   On your next visit with your primary care physician please Get Medicines reviewed and adjusted.   Please request your Prim.MD to go over all Hospital Tests and Procedure/Radiological results at the follow up, please get all Hospital records sent to your Prim MD by signing hospital release before you go home.   If you experience worsening of your admission symptoms, develop shortness of breath, life threatening emergency, suicidal or homicidal thoughts you must seek medical attention immediately by calling 911 or calling your MD immediately  if symptoms less severe.  You Must read complete instructions/literature along with all the possible adverse reactions/side effects for all the Medicines you take and that have been prescribed to you. Take any new Medicines after you have completely understood and accpet all the possible adverse reactions/side effects.   Do not drive, operate heavy machinery, perform activities at heights, swimming or participation in water activities or provide baby sitting services if your were admitted for syncope or siezures until you have seen by Primary MD or a Neurologist  and advised to do so again.  Do not drive when taking Pain medications.    Do not take more than prescribed Pain, Sleep and Anxiety Medications  Special Instructions: If you have smoked or chewed Tobacco  in the last 2 yrs please stop smoking, stop any regular Alcohol  and or any Recreational drug use.  Wear Seat belts while driving.   Please note  You were cared for by a hospitalist during your hospital stay. If you have any questions about your discharge medications or the care you received while you were in the hospital after you are discharged, you can call the unit and asked to speak with the hospitalist on call if the hospitalist that took care of you is not available. Once you are discharged, your primary care physician will handle any further medical issues. Please note that NO REFILLS for any discharge medications will be authorized once you are discharged, as it is imperative that you return to your primary care physician (or establish a relationship with a primary care physician if you do not have one) for your aftercare needs so that they can reassess your need for medications and monitor your lab values.

## 2016-05-21 NOTE — Progress Notes (Signed)
MD Candiss Norse in to evaluate patient sitting up in chair, unable to tolerate HFNC this morning, RT at bedside. MD Candiss Norse in to evaluate patient and a chest x-ray and IV lasix given per MD.

## 2016-05-21 NOTE — Progress Notes (Signed)
PROGRESS NOTE                                                                                                                                                                                                             Patient Demographics:    [name], is a 61 y.o. female, DOB - 10/05/1955, XBM:[id number]  Admit date - 05/13/2016   Admitting Physician Houston Siren, MD  Outpatient Primary MD for the patient is Cassell Smiles., MD  LOS - 8  Chief Complaint  Patient presents with  . Shortness of Breath       Brief Narrative  61 y.o. female with hx of severe COPD, prior smoker, on 4L [place] home oxygen, hx of CAD, hx of SVT, HLD, presented to the ER with increase SOB. She has been on chronic steroid, and was found to have a marked leukocytosis with WBC of 18K.Evaluation included a CXR showing no infiltrate, and her bicarb was elevated to 35's. Her ABG showed 7.2/99/pOx=119 of 50%FIO2. She was placed on Bipap.  She is advanced and terminal COPD, showed some improvement and was actually discharged morning of 05/21/2016, however after getting out of the bed patient started developing more wheezing and shortness of breath became tachycardic and discharge was canceled.   Subjective:    [name] today has, No headache, No chest pain, No abdominal pain - No Nausea, No new weakness tingling or numbness, +ve Cough & SOB but feels better.     Assessment  & Plan :     1.Acute on chronic hypoxic and hypercapnic respiratory failure. In a patient with advanced and terminal baseline COPD on 4-5 L nasal cannula oxygen at home, BiPAP at night and whenever she is in bed and daytime at baseline along with chronically on oral steroids, who is still smoking.  This is due to COPD exacerbation, pneumonia cannot be entirely ruled out. She is showing signs of improvement and will start titrating down IV steroids, continue Levaquin, nebulizer  treatments and increased oxygen/BiPAP as needed which she uses at home on when necessary basis as well. Pulmonary is following and defer management of this issue to Pulmonary.   Patient has been counseled to quit smoking, have requested her several times to sit in chair for Pulm toiletry or else she will develop atelectasis and Pneumonia on top of terminal  COPD, but she is non complaint of my recommendations.   She was discharged morning of 05/21/2016 but thereafter became tachycardic and more short of breath with wheezing, we will give her a trial of Lasix, change his steroid back to IV, cancel discharge, discussed her terminal and advanced COPD with her daughter who is a physician. Her daughter understands that patient has advanced and terminal COPD, she will begin DO NOT RESUSCITATE conversation and goals of care conversation with her mother.     2.Smoking - counseled to quit.  3.Weakness - increase activity, PT.  4. Anxiety and depression. Continue home regimen of benzodiazepines & Lexapro.  5.Dyslipidemia - on statin  6.Hx of CAD and SVT - stable, pain-free, aspirin, Cardizem and statin combination which will be continued. IV Cardizem when necessary for bursts of tachycardia. Monitor.  7. Mild acute on chronic diastolic CHF EF 55-60%. Trial of IV Lasix on 05/21/2016 and monitor.    Family Communication  :  Daughter over the phone on 05/19/2016, 05/21/16  Code Status :  Full  Diet : Diet Heart Room service appropriate? Yes; Fluid consistency: Thin Diet - low sodium heart healthy   Disposition Plan  :  SNF in the morning  Consults  :  Pulm  Procedures  :    DVT Prophylaxis  :  Lovenox    Lab Results  Component Value Date   PLT 381 05/20/2016    Inpatient Medications  Scheduled Meds: . aspirin  81 mg Oral Daily  . atorvastatin  40 mg Oral QPM  . chlorpheniramine-HYDROcodone  5 mL Oral Once  . clonazePAM  0.5-0.75 mg Oral QHS  . diltiazem  180 mg Oral Daily  .  enoxaparin (LOVENOX) injection  40 mg Subcutaneous Q24H  . escitalopram  20 mg Oral Daily  . fluconazole  100 mg Oral Q Mon-1800  . guaiFENesin  1,200 mg Oral BID  . ipratropium-albuterol  3 mL Nebulization Q4H  . levofloxacin  750 mg Oral QHS  . mouth rinse  15 mL Mouth Rinse BID  . multivitamin with minerals  1 tablet Oral Daily  . oxybutynin  5 mg Oral Daily  . predniSONE  50 mg Oral Q breakfast  . roflumilast  500 mcg Oral Daily  . sodium chloride flush  3 mL Intravenous Q12H   Continuous Infusions: . albuterol Stopped (05/13/16 2141)   PRN Meds:.acetaminophen, albuterol, ALPRAZolam, fluticasone  Antibiotics  :    Anti-infectives    Start     Dose/Rate Route Frequency Ordered Stop   05/20/16 2200  levofloxacin (LEVAQUIN) tablet 750 mg     750 mg Oral Daily at bedtime 05/20/16 0853     05/20/16 1800  fluconazole (DIFLUCAN) tablet 100 mg     100 mg Oral Every Mon (1800) 05/13/16 2310     05/13/16 2315  levofloxacin (LEVAQUIN) IVPB 750 mg  Status:  Discontinued     750 mg 100 mL/hr over 90 Minutes Intravenous Every 24 hours 05/13/16 2310 05/20/16 0835   05/13/16 2130  vancomycin (VANCOCIN) IVPB 1000 mg/200 mL premix     1,000 mg 200 mL/hr over 60 Minutes Intravenous  Once 05/13/16 2115 05/13/16 2232   05/13/16 2130  levofloxacin (LEVAQUIN) IVPB 750 mg     750 mg 100 mL/hr over 90 Minutes Intravenous  Once 05/13/16 2115 05/13/16 2324         Objective:   Vitals:   05/21/16 1025 05/21/16 1047 05/21/16 1137 05/21/16 1200  BP: 116/84   131/81  Pulse:  (!) 133  (!) 126  Resp:  (!) 22  (!) 22  Temp:      TempSrc:      SpO2:   91% 92%  Weight:      Height:        Wt Readings from Last 3 Encounters:  05/21/16 54.3 kg (119 lb 11.4 oz)  02/29/16 54 kg (119 lb)  06/21/15 52.2 kg (115 lb)     Intake/Output Summary (Last 24 hours) at 05/21/16 1320 Last data filed at 05/21/16 1100  Gross per 24 hour  Intake              361 ml  Output             1850 ml  Net             -1489 ml     Physical Exam  Awake Alert, Oriented X 3, No new F.N deficits, Normal affect Hansen.AT,PERRAL Supple Neck,No JVD, No cervical lymphadenopathy appriciated.  Symmetrical Chest wall movement, Mild to Mod air movement bilaterally, Bilat wheezing RRR,No Gallops,Rubs or new Murmurs, No Parasternal Heave +ve B.Sounds, Abd Soft, No tenderness, No organomegaly appriciated, No rebound - guarding or rigidity. No Cyanosis, Clubbing or edema, No new Rash or bruise       Data Review:    CBC  Recent Labs Lab 05/15/16 0433 05/16/16 0439 05/20/16 0420  WBC 16.3* 17.6* 19.7*  HGB 11.8* 11.8* 13.3  HCT 38.7 38.3 42.5  PLT 366 368 381  MCV 103.8* 103.5* 105.7*  MCH 31.6 31.9 33.1  MCHC 30.5 30.8 31.3  RDW 12.1 11.9 12.4    Chemistries   Recent Labs Lab 05/15/16 0433 05/16/16 0439 05/20/16 0420  NA 140 138 138  K 3.9 4.3 4.3  CL 93* 93* 88*  CO2 40* 41* 45*  GLUCOSE 152* 172* 176*  BUN 17 19 19   CREATININE 0.64 0.72 0.55  CALCIUM 8.6* 8.4* 8.1*   ------------------------------------------------------------------------------------------------------------------ No results for input(s): CHOL, HDL, LDLCALC, TRIG, CHOLHDL, LDLDIRECT in the last 72 hours.  Lab Results  Component Value Date   HGBA1C 6.0 (H) 11/14/2014   ------------------------------------------------------------------------------------------------------------------ No results for input(s): TSH, T4TOTAL, T3FREE, THYROIDAB in the last 72 hours.  Invalid input(s): FREET3 ------------------------------------------------------------------------------------------------------------------ No results for input(s): VITAMINB12, FOLATE, FERRITIN, TIBC, IRON, RETICCTPCT in the last 72 hours.  Coagulation profile No results for input(s): INR, PROTIME in the last 168 hours.  No results for input(s): DDIMER in the last 72 hours.  Cardiac Enzymes No results for input(s): CKMB, TROPONINI, MYOGLOBIN in the last  168 hours.  Invalid input(s): CK ------------------------------------------------------------------------------------------------------------------    Component Value Date/Time   BNP 67.0 05/13/2016 1929    Micro Results Recent Results (from the past 240 hour(s))  MRSA PCR Screening     Status: None   Collection Time: 05/14/16  5:40 PM  Result Value Ref Range Status   MRSA by PCR NEGATIVE NEGATIVE Final    Comment:        The GeneXpert MRSA Assay (FDA approved for NASAL specimens only), is one component of a comprehensive MRSA colonization surveillance program. It is not intended to diagnose MRSA infection nor to guide or monitor treatment for MRSA infections.   Respiratory Panel by PCR     Status: None   Collection Time: 05/15/16 10:54 AM  Result Value Ref Range Status   Adenovirus NOT DETECTED NOT DETECTED Final   Coronavirus 229E NOT DETECTED NOT DETECTED Final   Coronavirus HKU1 NOT DETECTED  NOT DETECTED Final   Coronavirus NL63 NOT DETECTED NOT DETECTED Final   Coronavirus OC43 NOT DETECTED NOT DETECTED Final   Metapneumovirus NOT DETECTED NOT DETECTED Final   Rhinovirus / Enterovirus NOT DETECTED NOT DETECTED Final   Influenza A NOT DETECTED NOT DETECTED Final   Influenza B NOT DETECTED NOT DETECTED Final   Parainfluenza Virus 1 NOT DETECTED NOT DETECTED Final   Parainfluenza Virus 2 NOT DETECTED NOT DETECTED Final   Parainfluenza Virus 3 NOT DETECTED NOT DETECTED Final   Parainfluenza Virus 4 NOT DETECTED NOT DETECTED Final   Respiratory Syncytial Virus NOT DETECTED NOT DETECTED Final   Bordetella pertussis NOT DETECTED NOT DETECTED Final   Chlamydophila pneumoniae NOT DETECTED NOT DETECTED Final   Mycoplasma pneumoniae NOT DETECTED NOT DETECTED Final    Comment: Performed at [facility]    Radiology Reports Dg Chest 1 View  Result Date: 05/13/2016 CLINICAL DATA:  Shortness of breath.  Ex-smoker. EXAM: CHEST 1 VIEW COMPARISON:  01/24/2015 FINDINGS:  Two frontal radiographs. Midline trachea. Lower cervical spine fixation. Normal heart size. Age advanced aortic atherosclerosis. No definite pleural fluid. No pneumothorax. 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 Scattered lower lobe predominant granulomas bilaterally. Hazy opacities project over both lower lobes. At least partially felt to be due to overlying breast tissue. Improved aeration on the right since the prior frontal radiograph of 11/14/2014. IMPRESSION: Bullous type emphysema. Increased density projecting over the lung bases is at least partially felt to be due to overlying breast tissue and AP portable technique. Probable concurrent scarring. Cannot exclude mild bibasilar pneumonia. Consider further evaluation with PA and lateral radiographs. Age advanced aortic atherosclerosis. Electronically Signed   By: Jeronimo Greaves M.D.   On: 05/13/2016 19:56   Dg Chest 2 View  Result Date: 05/15/2016 CLINICAL DATA:  Cough, shortness of breath. EXAM: CHEST  2 VIEW COMPARISON:  Radiographs of May 13, 2016. FINDINGS: Stable cardiomediastinal silhouette. Atherosclerosis of thoracic aorta is noted. Stable small calcified granulomata are noted bilaterally. Emphysematous disease is noted in the upper lobes bilaterally. No pneumothorax or pleural effusion is noted. No acute pulmonary disease is noted. Bony thorax is unremarkable. IMPRESSION: No active cardiopulmonary disease. Aortic atherosclerosis. Stable emphysematous disease. Electronically Signed   By: Lupita Raider, M.D.   On: 05/15/2016 10:42   Dg Chest Port 1 View  Result Date: 05/21/2016 CLINICAL DATA:  Shortness of breath EXAM: PORTABLE CHEST 1 VIEW COMPARISON:  05/20/2016 FINDINGS: Lungs are hyperexpanded. No focal airspace consolidation. No pulmonary edema. Cardiopericardial silhouette is enlarged. Calcified granuloma noted bilaterally. The visualized bony structures of the thorax are intact. Telemetry leads overlie the chest. IMPRESSION:  Stable.  Emphysema without acute cardiopulmonary findings. Electronically Signed   By: Kennith Center M.D.   On: 05/21/2016 09:25   Dg Chest Port 1 View  Result Date: 05/20/2016 CLINICAL DATA:  Shortness of breath, COPD EXAM: PORTABLE CHEST 1 VIEW COMPARISON:  05/15/2016 FINDINGS: Marked hyperinflation compatible with background COPD/ emphysema. No focal pneumonia, collapse or consolidation. Mild cardiac enlargement without CHF. Stable small calcified granulomata bilaterally. Trachea is midline. Aorta is atherosclerotic. IMPRESSION: Stable hyperinflation compatible with COPD/ emphysema Cardiomegaly without CHF Remote granulomatous disease and thoracic aortic atherosclerosis. Electronically Signed   By: Judie Petit.  Shick M.D.   On: 05/20/2016 08:07    Time Spent in minutes  30   SINGH,PRASHANT K M.D on 05/21/2016 at 1:20 PM  Between 7am to 7pm - Pager - [phone]  After 7pm go to [email or web address] -  password [facility]  [facility] -  Office  [phone]

## 2016-05-21 NOTE — Progress Notes (Signed)
Notified MD Luan Pulling and MD Candiss Norse that pt on HFNC 9L sitting in high fowler's position desat to 84%. MD Candiss Norse to evaluate patient.

## 2016-05-21 NOTE — Clinical Social Work Note (Signed)
Clinical Social Work Assessment  Patient Details  Name: [name] MRN: [id number] Date of Birth: [birth year]  Date of referral:  05/21/16               Reason for consult:  Discharge Planning                Permission sought to share information with:    Permission granted to share information::     Name::        Agency::     Relationship::     Contact Information:  Sister, [name], was at bedside.   Housing/Transportation Living arrangements for the past 2 months:  Single Family Home Source of Information:   (Sister, [name].) Patient Interpreter Needed:  None Criminal Activity/Legal Involvement Pertinent to Current Situation/Hospitalization:  No - Comment as needed Significant Relationships:  Adult Children Lives with:  Self Do you feel safe going back to the place where you live?  Yes Need for family participation in patient care:  Yes (Comment)  Care giving concerns:  Patient's sister checks on her 2-3 times per week.   Social Worker assessment / plan:  Ms. Marshell Levan stated that patient lives alone, does not really desire rehab at this time, uses a walker and needs assistance with ADLs. She stated that she feels patient needs rehab and that patient will most likely be interested in rehab if she could go to [facility]. Ms. Marshell Levan indicated that if patient could not go to [facility] that she would stay with patient.   Employment status:  Disabled (Comment on whether or not currently receiving Disability) Insurance information:  Medicare PT Recommendations:  [place] / Referral to community resources:  [place]  Patient/Family's Response to care:  Sister is agreeable for patient to go to [facility] only at this time.  Patient/Family's Understanding of and Emotional Response to Diagnosis, Current Treatment, and Prognosis:  Patient's sister understands patient's diagnosis, treatment and prognosis.   Emotional Assessment Appearance:  Appears  stated age Attitude/Demeanor/Rapport:  Unable to Assess Affect (typically observed):  Unable to Assess Orientation:  Oriented to Self, Oriented to Place, Oriented to  Time, Oriented to Situation Alcohol / Substance use:  Not Applicable Psych involvement (Current and /or in the community):     Discharge Needs  Concerns to be addressed:  Discharge Planning Concerns Readmission within the last 30 days:    Current discharge risk:  Chronically ill Barriers to Discharge:  Other (Patient is currently on HFNC)   Keian Odriscoll D, LCSW 05/21/2016, 2:17 PM

## 2016-05-22 DIAGNOSIS — M6281 Muscle weakness (generalized): Secondary | ICD-10-CM

## 2016-05-22 LAB — BASIC METABOLIC PANEL
Anion gap: 8 (ref 5–15)
BUN: 18 mg/dL (ref 6–20)
CO2: 47 mmol/L — ABNORMAL HIGH (ref 22–32)
Calcium: 8.3 mg/dL — ABNORMAL LOW (ref 8.9–10.3)
Chloride: 83 mmol/L — ABNORMAL LOW (ref 101–111)
Creatinine, Ser: 0.63 mg/dL (ref 0.44–1.00)
Glucose, Bld: 196 mg/dL — ABNORMAL HIGH (ref 65–99)
Potassium: 5.2 mmol/L — ABNORMAL HIGH (ref 3.5–5.1)
SODIUM: 138 mmol/L (ref 135–145)

## 2016-05-22 MED ORDER — ONDANSETRON HCL 4 MG/2ML IJ SOLN
4.0000 mg | Freq: Four times a day (QID) | INTRAMUSCULAR | Status: DC | PRN
  Administered 2016-05-22: 4 mg via INTRAVENOUS
  Filled 2016-05-22: qty 2

## 2016-05-22 NOTE — Progress Notes (Signed)
Physical Therapy Treatment Patient Details Name: [name] MRN: [id number] DOB: [birth year] Today's Date: 05/22/2016    History of Present Illness 61 y.o. female with hx of severe COPD, prior smoker, on 4L [place] home oxygen, hx of CAD, hx of SVT, HLD, presented to the ER with increase SOB.  She has been on chronic steroid, and  was found to have a marked leukocytosis with WBC of 18K.   Evaluation included a CXR showing no infiltrate, and her bicarb was elevated to 35's.  Her ABG showed 7.2/99/pOx=119 of 50%FIO2. She was placed on Bipap, and repeat ABG did not change significantly.  Consideration for intubation, however, she appeared well and had significant improvement, so it was deferred.  Hospitalist was asked to admit her for further evaluation and tx.  I remember her as I had taken care of her before.  She sees Dr Egbert Garibaldi of pulmonary outpatient, and she had seen Dr Blase Mess shortly during her last bout. We will bring her in for COPD exacerbation, though she was not in significant respiratory distress.  She does use her CPAP/Bipap every night at home.     PT Comments    Pt received in bed, and was marginally agreeable to PT tx.  She was able to increase gait distance to 75ft with Min guard and RW on 8L HFNC, however she continues to desaturate to at least 85% while mobilizing.  At this point, continue to primarily recommend SNF due to her poor endurance, which would be required to perform functional mobility tasks and ADL's.     Follow Up Recommendations  SNF;Supervision/Assistance - 24 hour;Home health PT     Equipment Recommendations  Wheelchair (measurements PT);Wheelchair cushion (measurements PT)    Recommendations for Other Services       Precautions / Restrictions Precautions Precautions: Fall Precaution Comments: Due to immobility  Restrictions Weight Bearing Restrictions: No    Mobility  Bed Mobility Overal bed mobility: Modified Independent                 Transfers Overall transfer level: Modified independent Equipment used: Rolling walker (2 wheeled) Transfers: Sit to/from Stand Sit to Stand: Min guard Stand pivot transfers: Min guard          Ambulation/Gait Ambulation/Gait assistance: Min guard;Min assist Ambulation Distance (Feet): 30 Feet Assistive device: Rolling walker (2 wheeled) Gait Pattern/deviations: Trunk flexed;Shuffle     General Gait Details: Pt has difficulty keeping feet inside the base of the RW.  Fatigues quickly with SpO2 desaturation to at least 85% while on 8L HFNC.     Stairs            Wheelchair Mobility    Modified Rankin (Stroke Patients Only)       Balance Overall balance assessment: Needs assistance Sitting-balance support: Bilateral upper extremity supported;Feet unsupported Sitting balance-Leahy Scale: Good     Standing balance support: Bilateral upper extremity supported Standing balance-Leahy Scale: Fair                      Cognition Arousal/Alertness: Awake/alert (Pt is minimally verbal today. ) Behavior During Therapy: Flat affect                   General Comments: Pt does not follow commands in timely manner, however, unsure if this is behavior related, or cognition related.  Ie: pt would not scoot forward towards the EOB to get feet on the floor prior to standing.  Pt had difficulty staying  inside the base of the RW during gait despite multiple vc's.  Pt did not use UE's to push up from the [facility] despite education to do so.      Exercises      General Comments        Pertinent Vitals/Pain Pain Assessment: No/denies pain    Home Living                      Prior Function            PT Goals (current goals can now be found in the care plan section) Acute Rehab PT Goals Patient Stated Goal: To go home.  PT Goal Formulation: With patient/family Time For Goal Achievement: 05/29/16 Potential to Achieve Goals: Good Progress towards PT  goals: Progressing toward goals    Frequency    Min 3X/week      PT Plan Frequency needs to be updated    Co-evaluation             End of Session Equipment Utilized During Treatment: Oxygen;Gait belt Activity Tolerance: Patient limited by fatigue Patient left: in chair;with call bell/phone within reach;with family/visitor present     Time: 1217-1242 PT Time Calculation (min) (ACUTE ONLY): 25 min  Charges:  $Gait Training: 8-22 mins $Therapeutic Activity: 8-22 mins                    G Codes:      Beth Lynton Crescenzo, PT, DPT X: [phone]

## 2016-05-22 NOTE — Progress Notes (Signed)
Subjective: She continues to be short of breath and hypoxic with minimal exertion. This morning she got up to go to the bathroom and dropped her oxygenation down into the 80s. She was on 4 L at that time I've increased up to 6 and she is gradually coming back up. She looks very dyspneic. No other new complaints. No nausea vomiting diarrhea chest pain. She's coughing mostly nonproductively.  Objective: Vital signs in last 24 hours: Temp:  [97.3 F (36.3 C)-98.3 F (36.8 C)] 98.3 F (36.8 C) (01/17 0726) Pulse Rate:  [88-141] 91 (01/17 0726) Resp:  [18-32] 23 (01/17 0726) BP: (116-154)/(73-86) 127/77 (01/17 0600) SpO2:  [83 %-98 %] 93 % (01/17 0726) FiO2 (%):  [40 %] 40 % (01/16 1137) Weight:  [52.3 kg (115 lb 4.8 oz)] 52.3 kg (115 lb 4.8 oz) (01/17 0500) Weight change: -2 kg (-4 lb 6.6 oz) Last BM Date: 05/21/16  Intake/Output from previous day: 01/16 0701 - 01/17 0700 In: 481 [P.O.:480; I.V.:1] Out: 3450 [Urine:3450]  PHYSICAL EXAM General appearance: alert, cooperative and moderate distress Resp: Decreased breath sounds and rhonchi bilaterally Cardio: Her heart is regular but on the monitor I saw a burst of what looked like atrial flutter which then spontaneously resolved GI: soft, non-tender; bowel sounds normal; no masses,  no organomegaly Extremities: extremities normal, atraumatic, no cyanosis or edema Skin warm and dry. Mucous membranes are moist  Lab Results:  Results for orders placed or performed during the hospital encounter of 05/13/16 (from the past 48 hour(s))  Basic metabolic panel     Status: Abnormal   Collection Time: 05/22/16  5:56 AM  Result Value Ref Range   Sodium 138 135 - 145 mmol/L   Potassium 5.2 (H) 3.5 - 5.1 mmol/L   Chloride 83 (L) 101 - 111 mmol/L   CO2 47 (H) 22 - 32 mmol/L   Glucose, Bld 196 (H) 65 - 99 mg/dL   BUN 18 6 - 20 mg/dL   Creatinine, Ser 1.99 0.44 - 1.00 mg/dL   Calcium 8.3 (L) 8.9 - 10.3 mg/dL   GFR calc non Af Amer >60 >60  mL/min   GFR calc Af Amer >60 >60 mL/min    Comment: (NOTE) The eGFR has been calculated using the CKD EPI equation. This calculation has not been validated in all clinical situations. eGFR's persistently <60 mL/min signify possible Chronic Kidney Disease.    Anion gap 8 5 - 15    ABGS No results for input(s): PHART, PO2ART, TCO2, HCO3 in the last 72 hours.  Invalid input(s): PCO2 CULTURES Recent Results (from the past 240 hour(s))  MRSA PCR Screening     Status: None   Collection Time: 05/14/16  5:40 PM  Result Value Ref Range Status   MRSA by PCR NEGATIVE NEGATIVE Final    Comment:        The GeneXpert MRSA Assay (FDA approved for NASAL specimens only), is one component of a comprehensive MRSA colonization surveillance program. It is not intended to diagnose MRSA infection nor to guide or monitor treatment for MRSA infections.   Respiratory Panel by PCR     Status: None   Collection Time: 05/15/16 10:54 AM  Result Value Ref Range Status   Adenovirus NOT DETECTED NOT DETECTED Final   Coronavirus 229E NOT DETECTED NOT DETECTED Final   Coronavirus HKU1 NOT DETECTED NOT DETECTED Final   Coronavirus NL63 NOT DETECTED NOT DETECTED Final   Coronavirus OC43 NOT DETECTED NOT DETECTED Final   Metapneumovirus NOT  DETECTED NOT DETECTED Final   Rhinovirus / Enterovirus NOT DETECTED NOT DETECTED Final   Influenza A NOT DETECTED NOT DETECTED Final   Influenza B NOT DETECTED NOT DETECTED Final   Parainfluenza Virus 1 NOT DETECTED NOT DETECTED Final   Parainfluenza Virus 2 NOT DETECTED NOT DETECTED Final   Parainfluenza Virus 3 NOT DETECTED NOT DETECTED Final   Parainfluenza Virus 4 NOT DETECTED NOT DETECTED Final   Respiratory Syncytial Virus NOT DETECTED NOT DETECTED Final   Bordetella pertussis NOT DETECTED NOT DETECTED Final   Chlamydophila pneumoniae NOT DETECTED NOT DETECTED Final   Mycoplasma pneumoniae NOT DETECTED NOT DETECTED Final    Comment: Performed at [facility]   Studies/Results: Dg Chest Port 1 View  Result Date: 05/21/2016 CLINICAL DATA:  Shortness of breath EXAM: PORTABLE CHEST 1 VIEW COMPARISON:  05/20/2016 FINDINGS: Lungs are hyperexpanded. No focal airspace consolidation. No pulmonary edema. Cardiopericardial silhouette is enlarged. Calcified granuloma noted bilaterally. The visualized bony structures of the thorax are intact. Telemetry leads overlie the chest. IMPRESSION: Stable.  Emphysema without acute cardiopulmonary findings. Electronically Signed   By: Misty Stanley M.D.   On: 05/21/2016 09:25    Medications:  Prior to Admission:  Prescriptions Prior to Admission  Medication Sig Dispense Refill Last Dose  . albuterol (PROAIR HFA) 108 (90 BASE) MCG/ACT inhaler Inhale 2 puffs into the lungs every 6 (six) hours as needed. For shortness of breath   05/13/2016 at Unknown time  . aspirin 81 MG tablet Take 81 mg by mouth daily.   05/13/2016 at Unknown time  . atorvastatin (LIPITOR) 40 MG tablet Take 1 tablet (40 mg total) by mouth daily. (Patient taking differently: Take 40 mg by mouth every evening. ) 90 tablet 3 05/12/2016 at Unknown time  . azithromycin (ZITHROMAX) 250 MG tablet Take 250 mg by mouth every Monday, Wednesday, and Friday.    05/10/2016  . cetirizine (ZYRTEC) 10 MG tablet Take 10 mg by mouth daily.     05/13/2016 at Unknown time  . diltiazem (CARDIZEM CD) 180 MG 24 hr capsule Take 1 capsule (180 mg total) by mouth daily. 90 capsule 3 05/13/2016 at Unknown time  . escitalopram (LEXAPRO) 20 MG tablet Take 20 mg by mouth daily.     05/13/2016 at Unknown time  . fluconazole (DIFLUCAN) 150 MG tablet Take 150 mg by mouth once a week.    05/13/2016 at Unknown time  . Fluticasone-Salmeterol (ADVAIR) 250-50 MCG/DOSE AEPB Inhale 1 puff into the lungs 2 (two) times daily.    05/13/2016 at Unknown time  . guaiFENesin (MUCINEX) 600 MG 12 hr tablet Take 1 tablet (600 mg total) by mouth 2 (two) times daily. 20 tablet 0 05/13/2016 at Unknown time  .  ibuprofen (ADVIL,MOTRIN) 200 MG tablet Take 400 mg by mouth 2 (two) times daily as needed for mild pain or moderate pain.   05/13/2016 at Unknown time  . ipratropium-albuterol (DUONEB) 0.5-2.5 (3) MG/3ML SOLN Take 3 mLs by nebulization 3 (three) times daily. 360 mL 1 05/13/2016 at Unknown time  . levofloxacin (LEVAQUIN) 750 MG tablet Take 750 mg by mouth daily.   05/13/2016 at Unknown time  . montelukast (SINGULAIR) 10 MG tablet Take 1 tablet (10 mg total) by mouth at bedtime. 30 tablet 1 05/12/2016 at Unknown time  . Multiple Vitamins-Minerals (MULTIVITAMINS THER. W/MINERALS) TABS Take 1 tablet by mouth daily.     05/13/2016 at Unknown time  . oxybutynin (DITROPAN) 5 MG tablet Take 5 mg by mouth daily.  05/13/2016 at Unknown time  . roflumilast (DALIRESP) 500 MCG TABS tablet Take 500 mcg by mouth daily.     05/13/2016 at Unknown time  . SPIRIVA RESPIMAT 2.5 MCG/ACT AERS Inhale 1 puff into the lungs daily.    05/13/2016 at Unknown time  . triamcinolone (NASACORT ALLERGY 24HR) 55 MCG/ACT AERO nasal inhaler Place 2 sprays into the nose daily as needed (FOR ALLERGIES).   UNKNOWN  . [DISCONTINUED] ALPRAZolam (XANAX) 0.5 MG tablet Take 0.5 mg by mouth daily as needed. anxiety   05/13/2016 at Unknown time  . [DISCONTINUED] clonazePAM (KLONOPIN) 0.5 MG tablet Take 0.5-0.75 mg by mouth at bedtime.    05/12/2016 at Unknown time  . [DISCONTINUED] predniSONE (DELTASONE) 10 MG tablet Take 10 mg by mouth every morning.   HOLD  . predniSONE (DELTASONE) 10 MG tablet Take 60 mg x 3 days then 40 mg x 3 days then 30 mg x 3 days then '20mg'$  x 3 days then resume maintenance dose 10 mg daily (Patient not taking: Reported on 05/13/2016) 60 tablet 2 Not Taking at Unknown time   Scheduled: . aspirin  81 mg Oral Daily  . atorvastatin  40 mg Oral QPM  . chlorpheniramine-HYDROcodone  5 mL Oral Once  . clonazePAM  0.5-0.75 mg Oral QHS  . diltiazem  180 mg Oral Daily  . enoxaparin (LOVENOX) injection  40 mg Subcutaneous Q24H  . escitalopram  20  mg Oral Daily  . fluconazole  100 mg Oral Q Mon-1800  . guaiFENesin  1,200 mg Oral BID  . ipratropium-albuterol  3 mL Nebulization Q4H  . levofloxacin  750 mg Oral QHS  . mouth rinse  15 mL Mouth Rinse BID  . methylPREDNISolone (SOLU-MEDROL) injection  60 mg Intravenous TID  . multivitamin with minerals  1 tablet Oral Daily  . oxybutynin  5 mg Oral Daily  . roflumilast  500 mcg Oral Daily  . sodium chloride flush  3 mL Intravenous Q12H   Continuous: . albuterol Stopped (05/13/16 2141)   AGT:[id number], albuterol, ALPRAZolam, diltiazem, fluticasone  Assesment:She was admitted with acute hypoxic and hypercapnic respiratory failure. 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 She is very slowly improving but still markedly hypoxic with minimal exertion. She is on antibiotic steroids inhaled bronchodilators so I don't think there is much to add Principal Problem:   Acute respiratory failure with hypercapnia (HCC) Active Problems:   Hyperlipidemia   COPD exacerbation (HCC)   Acute on chronic respiratory failure (HCC)   Chronic respiratory failure with hypoxia (HCC)   Leukocytosis    Plan: Continue current treatments    LOS: 9 days   [name] L 05/22/2016, 8:05 AM

## 2016-05-22 NOTE — Plan of Care (Signed)
Problem: Pain Managment: Goal: General experience of comfort will improve Outcome: Progressing Pt able to utilize pain scale to describe pain.

## 2016-05-22 NOTE — Progress Notes (Signed)
PROGRESS NOTE    [name]  EXB:[id number] DOB: 02-Aug-1955 DOA: 05/13/2016 PCP: Cassell Smiles., MD   Brief Narrative: 61 y.o. female with hx of severe COPD, prior smoker, on 4L [place] home oxygen, hx of CAD, hx of SVT, HLD, presented to the ER with increase SOB. She has been on chronic steroid, and was found to have a marked leukocytosis with WBC of 18K.Evaluation included a CXR showing no infiltrate, and her bicarb was elevated to 35's. Her ABG showed 7.2/99/pOx=119 of 50%FIO2. She was placed on Bipap. She has advanced and terminal COPD  Assessment & Plan:  # Acute on chronic hypoxic and hypercapnic respiratory failure in the setting of advanced COPD, COPD exacerbation: -Patient is still short of breath and hypoxic requiring about 6 L of oxygen. She uses about 4 L of oxygen at home. She has BiPAP at night and chronically on oral steroid. Patient is still smoking at home. -Patient with no clinical improvement. Plan to continue Levaquin, Solu-Medrol IV, Daliresp, bronchodilators and breathing treatment. -Pulmonary consult appreciated. I discussed with Dr. Juanetta Gosling from pulmonology.  #History of coronary artery disease and SVT: The patient is on diltiazem. Continue to monitor heart rate. She has no chest pain.  #Presumed mild acute on chronic diastolic CHF: Received a dose of Lasix on 1/16. The chest x-ray with no pulmonary edema. Hold diuretics for now. Left ventricular EF of 60% in February 2017.  #Active smoking, tobacco dependence: Patient was counseled.  #Anxiety depression: Continue Lexapro and xanax as needed.  # Generalized weakness likely physical deconditioning in the setting of medical comorbidities: TSH level acceptable. PT, OT evaluation and treatment.  Principal Problem:   Acute respiratory failure with hypercapnia (HCC) Active Problems:   Hyperlipidemia   COPD exacerbation (HCC)   Acute on chronic respiratory failure (HCC)   Chronic respiratory failure with  hypoxia (HCC)   Leukocytosis  DVT prophylaxis: Lovenox subcutaneous. Code Status: Full code Family Communication: Patient's sister at bedside Disposition Plan: Currently in  stepdown.  Consultants:   Pulmonologist  Procedures: None Antimicrobials: Levaquin  Subjective: Patient was seen and examined at bedside. Patient reported a stable shortness of breath, generalized weakness and fatigue. Also has some nausea. Denied vomiting, fever, chills, abdominal pain. Patient's sister at bedside.   Objective: Vitals:   05/22/16 0800 05/22/16 0846 05/22/16 1000 05/22/16 1122  BP: 140/75  120/67   Pulse: (!) 104  (!) 114 98  Resp: (!) 24  (!) 21 (!) 23  Temp:    97.5 F (36.4 C)  TempSrc:    Axillary  SpO2: (!) 87% 93% 92% 93%  Weight:      Height:        Intake/Output Summary (Last 24 hours) at 05/22/16 1204 Last data filed at 05/22/16 0900  Gross per 24 hour  Intake              360 ml  Output             3300 ml  Net            -2940 ml   Filed Weights   05/20/16 0500 05/21/16 0451 05/22/16 0500  Weight: 55 kg (121 lb 4.1 oz) 54.3 kg (119 lb 11.4 oz) 52.3 kg (115 lb 4.8 oz)    Examination:  General exam: Looks anxious, not in distress.  Respiratory system: Bilateral diffuse expiratory wheeze, no dyspnea. Dense, respiratory effort normal Cardiovascular system: S1 & S2 heard, RRR.  No pedal edema. Gastrointestinal system: Abdomen is  nondistended, soft and nontender. Normal bowel sounds heard. Central nervous system: Alert awake, following commands. Extremities: Symmetric 5 x 5 power. Skin: No rashes, lesions or ulcers Psychiatry: Judgement and insight appear normal. Mood & affect appropriate.     Data Reviewed: I have personally reviewed following labs and imaging studies  CBC:  Recent Labs Lab 05/16/16 0439 05/20/16 0420  WBC 17.6* 19.7*  HGB 11.8* 13.3  HCT 38.3 42.5  MCV 103.5* 105.7*  PLT 368 381   Basic Metabolic Panel:  Recent Labs Lab  05/16/16 0439 05/20/16 0420 05/22/16 0556  NA 138 138 138  K 4.3 4.3 5.2*  CL 93* 88* 83*  CO2 41* 45* 47*  GLUCOSE 172* 176* 196*  BUN 19 19 18   CREATININE 0.72 0.55 0.63  CALCIUM 8.4* 8.1* 8.3*   GFR: Estimated Creatinine Clearance: 60.6 mL/min (by C-G formula based on SCr of 0.63 mg/dL). Liver Function Tests: No results for input(s): AST, ALT, ALKPHOS, BILITOT, PROT, ALBUMIN in the last 168 hours. No results for input(s): LIPASE, AMYLASE in the last 168 hours. No results for input(s): AMMONIA in the last 168 hours. Coagulation Profile: No results for input(s): INR, PROTIME in the last 168 hours. Cardiac Enzymes: No results for input(s): CKTOTAL, CKMB, CKMBINDEX, TROPONINI in the last 168 hours. BNP (last 3 results) No results for input(s): PROBNP in the last 8760 hours. HbA1C: No results for input(s): HGBA1C in the last 72 hours. CBG:  Recent Labs Lab 05/15/16 1632  GLUCAP 158*   Lipid Profile: No results for input(s): CHOL, HDL, LDLCALC, TRIG, CHOLHDL, LDLDIRECT in the last 72 hours. Thyroid Function Tests: No results for input(s): TSH, T4TOTAL, FREET4, T3FREE, THYROIDAB in the last 72 hours. Anemia Panel: No results for input(s): VITAMINB12, FOLATE, FERRITIN, TIBC, IRON, RETICCTPCT in the last 72 hours. Sepsis Labs: No results for input(s): PROCALCITON, LATICACIDVEN in the last 168 hours.  Recent Results (from the past 240 hour(s))  MRSA PCR Screening     Status: None   Collection Time: 05/14/16  5:40 PM  Result Value Ref Range Status   MRSA by PCR NEGATIVE NEGATIVE Final    Comment:        The GeneXpert MRSA Assay (FDA approved for NASAL specimens only), is one component of a comprehensive MRSA colonization surveillance program. It is not intended to diagnose MRSA infection nor to guide or monitor treatment for MRSA infections.   Respiratory Panel by PCR     Status: None   Collection Time: 05/15/16 10:54 AM  Result Value Ref Range Status    Adenovirus NOT DETECTED NOT DETECTED Final   Coronavirus 229E NOT DETECTED NOT DETECTED Final   Coronavirus HKU1 NOT DETECTED NOT DETECTED Final   Coronavirus NL63 NOT DETECTED NOT DETECTED Final   Coronavirus OC43 NOT DETECTED NOT DETECTED Final   Metapneumovirus NOT DETECTED NOT DETECTED Final   Rhinovirus / Enterovirus NOT DETECTED NOT DETECTED Final   Influenza A NOT DETECTED NOT DETECTED Final   Influenza B NOT DETECTED NOT DETECTED Final   Parainfluenza Virus 1 NOT DETECTED NOT DETECTED Final   Parainfluenza Virus 2 NOT DETECTED NOT DETECTED Final   Parainfluenza Virus 3 NOT DETECTED NOT DETECTED Final   Parainfluenza Virus 4 NOT DETECTED NOT DETECTED Final   Respiratory Syncytial Virus NOT DETECTED NOT DETECTED Final   Bordetella pertussis NOT DETECTED NOT DETECTED Final   Chlamydophila pneumoniae NOT DETECTED NOT DETECTED Final   Mycoplasma pneumoniae NOT DETECTED NOT DETECTED Final    Comment: Performed at  [facility]         Radiology Studies: Dg Chest Port 1 View  Result Date: 05/21/2016 CLINICAL DATA:  Shortness of breath EXAM: PORTABLE CHEST 1 VIEW COMPARISON:  05/20/2016 FINDINGS: Lungs are hyperexpanded. No focal airspace consolidation. No pulmonary edema. Cardiopericardial silhouette is enlarged. Calcified granuloma noted bilaterally. The visualized bony structures of the thorax are intact. Telemetry leads overlie the chest. IMPRESSION: Stable.  Emphysema without acute cardiopulmonary findings. Electronically Signed   By: Kennith Center M.D.   On: 05/21/2016 09:25        Scheduled Meds: . aspirin  81 mg Oral Daily  . atorvastatin  40 mg Oral QPM  . chlorpheniramine-HYDROcodone  5 mL Oral Once  . clonazePAM  0.5-0.75 mg Oral QHS  . diltiazem  180 mg Oral Daily  . enoxaparin (LOVENOX) injection  40 mg Subcutaneous Q24H  . escitalopram  20 mg Oral Daily  . fluconazole  100 mg Oral Q Mon-1800  . guaiFENesin  1,200 mg Oral BID  . ipratropium-albuterol  3  mL Nebulization Q4H  . levofloxacin  750 mg Oral QHS  . mouth rinse  15 mL Mouth Rinse BID  . methylPREDNISolone (SOLU-MEDROL) injection  60 mg Intravenous TID  . multivitamin with minerals  1 tablet Oral Daily  . oxybutynin  5 mg Oral Daily  . roflumilast  500 mcg Oral Daily  . sodium chloride flush  3 mL Intravenous Q12H   Continuous Infusions: . albuterol Stopped (05/13/16 2141)     LOS: 9 days    Daelynn Blower Jaynie Collins, MD [facility] Pager [phone]  If 7PM-7AM, please contact night-coverage [email or web address] Password [facility] 05/22/2016, 12:04 PM

## 2016-05-23 ENCOUNTER — Inpatient Hospital Stay (HOSPITAL_COMMUNITY): Payer: Medicare Other

## 2016-05-23 LAB — BASIC METABOLIC PANEL
Anion gap: 8 (ref 5–15)
BUN: 18 mg/dL (ref 6–20)
CHLORIDE: 83 mmol/L — AB (ref 101–111)
CO2: 48 mmol/L — ABNORMAL HIGH (ref 22–32)
CREATININE: 0.6 mg/dL (ref 0.44–1.00)
Calcium: 8.1 mg/dL — ABNORMAL LOW (ref 8.9–10.3)
GFR calc Af Amer: >60 mL/min (ref >60)
GFR calc non Af Amer: >60 mL/min (ref >60)
Glucose, Bld: 254 mg/dL — ABNORMAL HIGH (ref 65–99)
Potassium: 5.3 mmol/L — ABNORMAL HIGH (ref 3.5–5.1)
SODIUM: 139 mmol/L (ref 135–145)

## 2016-05-23 LAB — CBC
HEMATOCRIT: 43.6 % (ref 36.0–46.0)
Hemoglobin: 13.5 g/dL (ref 12.0–15.0)
MCH: 32.8 pg (ref 26.0–34.0)
MCHC: 31 g/dL (ref 30.0–36.0)
MCV: 106.1 fL — AB (ref 78.0–100.0)
PLATELETS: 315 10*3/uL (ref 150–400)
RBC: 4.11 MIL/uL (ref 3.87–5.11)
RDW: 12.3 % (ref 11.5–15.5)
WBC: 18.6 10*3/uL — AB (ref 4.0–10.5)

## 2016-05-23 MED ORDER — IPRATROPIUM-ALBUTEROL 0.5-2.5 (3) MG/3ML IN SOLN
3.0000 mL | Freq: Four times a day (QID) | RESPIRATORY_TRACT | Status: DC
  Administered 2016-05-23 (×3): 3 mL via RESPIRATORY_TRACT
  Filled 2016-05-23 (×3): qty 3

## 2016-05-23 MED ORDER — IPRATROPIUM-ALBUTEROL 0.5-2.5 (3) MG/3ML IN SOLN
3.0000 mL | Freq: Three times a day (TID) | RESPIRATORY_TRACT | Status: DC
  Administered 2016-05-24 – 2016-05-28 (×13): 3 mL via RESPIRATORY_TRACT
  Filled 2016-05-23 (×13): qty 3

## 2016-05-23 MED ORDER — PREDNISONE 20 MG PO TABS
40.0000 mg | ORAL_TABLET | Freq: Every day | ORAL | Status: DC
  Administered 2016-05-24 – 2016-05-28 (×5): 40 mg via ORAL
  Filled 2016-05-23 (×5): qty 2

## 2016-05-23 NOTE — Progress Notes (Signed)
Physical Therapy Treatment Patient Details Name: [name] MRN: [id number] DOB: 1955-07-16 Today's Date: 05/23/2016    History of Present Illness 61 y.o. female with hx of severe COPD, prior smoker, on 4L [place] home oxygen, hx of CAD, hx of SVT, HLD, presented to the ER with increase SOB.  She has been on chronic steroid, and  was found to have a marked leukocytosis with WBC of 18K.   Evaluation included a CXR showing no infiltrate, and her bicarb was elevated to 35's.  Her ABG showed 7.2/99/pOx=119 of 50%FIO2. She was placed on Bipap, and repeat ABG did not change significantly.  Consideration for intubation, however, she appeared well and had significant improvement, so it was deferred.  Hospitalist was asked to admit her for further evaluation and tx.  I remember her as I had taken care of her before.  She sees Dr Egbert Garibaldi of pulmonary outpatient, and she had seen Dr Blase Mess shortly during her last bout. We will bring her in for COPD exacerbation, though she was not in significant respiratory distress.  She does use her CPAP/Bipap every night at home.     PT Comments    Pt eager to get out of bed and complete activity.  Pt ambulated with HHA using 4L02.  Nursing request patient remained monitored and in room for activity today so limited with distance secondary to tubes.  Noted with good saturation with activity and standing 90-93%, however when returned to seated resting position, desaturated quickly to 84%.  Returned to 90% following cues for breathing and relaxation.  Pt stood again after rest with good saturation noted.  Nursing informed of desat findings.  Pt in chair with sister present at end of session.           Precautions / Restrictions Precautions Precautions: Fall Precaution Comments: Due to immobility  Restrictions Weight Bearing Restrictions: No    Mobility  Bed Mobility Overal bed mobility: Modified Independent                Transfers Overall transfer level: Modified  independent Equipment used: None Transfers: Sit to/from Stand Sit to Stand: Min guard            Ambulation/Gait Ambulation/Gait assistance: Min guard Ambulation Distance (Feet): 35 Feet Assistive device: 1 person hand held assist Gait Pattern/deviations: Trunk flexed;Shuffle     General Gait Details: only able to walk in room per nursing request.  Good O2 sats with actvitity, however desaturated as low as 84% when returned to seated/rest.   Stairs            Wheelchair Mobility    Modified Rankin (Stroke Patients Only)          Cognition Arousal/Alertness: Awake/alert Behavior During Therapy: Flat affect Overall Cognitive Status: Within Functional Limits for tasks assessed                 General Comments: Pt does not follow commands in timely manner, however, unsure if this is behavior related, or cognition related.  Ie: pt would not scoot forward towards the EOB to get feet on the floor prior to standing.  Pt had difficulty staying inside the base of the RW during gait despite multiple vc's.  Pt did not use UE's to push up from the [facility] despite education to do so.             Pertinent Vitals/Pain Pain Assessment: No/denies pain       Prior Function  PT Goals (current goals can now be found in the care plan section)      Frequency           PT Plan Current plan remains appropriate                 End of Session Equipment Utilized During Treatment: Gait belt Activity Tolerance: Patient limited by fatigue Patient left: in chair;with family/visitor present;with call bell/phone within reach     Time: 1045-1120 PT Time Calculation (min) (ACUTE ONLY): 35 min  Charges:  $Gait Training: 8-22 mins $Therapeutic Activity: 8-22 mins           Lurena Nida, PTA/CLT [phone]           05/23/2016, 11:35 AM

## 2016-05-23 NOTE — Progress Notes (Signed)
Subjective: She is clearly better. She has been confused. Much less short of breath. She is still short of breath with exertion and still hypoxic with exertion but better than previously. No chest pain abdominal pain nausea vomiting.  Objective: Vital signs in last 24 hours: Temp:  [97.5 F (36.4 C)-98.5 F (36.9 C)] 98.5 F (36.9 C) (01/18 0800) Pulse Rate:  [89-120] 110 (01/18 0800) Resp:  [18-36] 28 (01/18 0800) BP: (118-143)/(63-85) 123/74 (01/18 0800) SpO2:  [83 %-96 %] 92 % (01/18 0800) Weight:  [52.3 kg (115 lb 4.8 oz)] 52.3 kg (115 lb 4.8 oz) (01/18 0500) Weight change: 0 kg (0 lb) Last BM Date: 05/21/16  Intake/Output from previous day: 01/17 0701 - 01/18 0700 In: 123 [P.O.:120; I.V.:3] Out: 600 [Urine:600]  PHYSICAL EXAM General appearance: alert, mild distress and Confused Resp: She has diminished breath sounds and prolonged expiratory phase but no wheezing now Cardio: regular rate and rhythm, S1, S2 normal, no murmur, click, rub or gallop GI: soft, non-tender; bowel sounds normal; no masses,  no organomegaly Extremities: extremities normal, atraumatic, no cyanosis or edema Skin warm and dry. Pupils reactive. Mucous membranes are moist  Lab Results:  Results for orders placed or performed during the hospital encounter of 05/13/16 (from the past 48 hour(s))  Basic metabolic panel     Status: Abnormal   Collection Time: 05/22/16  5:56 AM  Result Value Ref Range   Sodium 138 135 - 145 mmol/L   Potassium 5.2 (H) 3.5 - 5.1 mmol/L   Chloride 83 (L) 101 - 111 mmol/L   CO2 47 (H) 22 - 32 mmol/L   Glucose, Bld 196 (H) 65 - 99 mg/dL   BUN 18 6 - 20 mg/dL   Creatinine, Ser 0.82 0.44 - 1.00 mg/dL   Calcium 8.3 (L) 8.9 - 10.3 mg/dL   GFR calc non Af Amer >60 >60 mL/min   GFR calc Af Amer >60 >60 mL/min    Comment: (NOTE) The eGFR has been calculated using the CKD EPI equation. This calculation has not been validated in all clinical situations. eGFR's persistently <60  mL/min signify possible Chronic Kidney Disease.    Anion gap 8 5 - 15  CBC     Status: Abnormal   Collection Time: 05/23/16  4:29 AM  Result Value Ref Range   WBC 18.6 (H) 4.0 - 10.5 K/uL   RBC 4.11 3.87 - 5.11 MIL/uL   Hemoglobin 13.5 12.0 - 15.0 g/dL   HCT 35.0 95.1 - 52.0 %   MCV 106.1 (H) 78.0 - 100.0 fL   MCH 32.8 26.0 - 34.0 pg   MCHC 31.0 30.0 - 36.0 g/dL   RDW 84.2 48.7 - 65.8 %   Platelets 315 150 - 400 K/uL  Basic metabolic panel     Status: Abnormal   Collection Time: 05/23/16  4:29 AM  Result Value Ref Range   Sodium 139 135 - 145 mmol/L   Potassium 5.3 (H) 3.5 - 5.1 mmol/L   Chloride 83 (L) 101 - 111 mmol/L   CO2 48 (H) 22 - 32 mmol/L   Glucose, Bld 254 (H) 65 - 99 mg/dL   BUN 18 6 - 20 mg/dL   Creatinine, Ser 6.89 0.44 - 1.00 mg/dL   Calcium 8.1 (L) 8.9 - 10.3 mg/dL   GFR calc non Af Amer >60 >60 mL/min   GFR calc Af Amer >60 >60 mL/min    Comment: (NOTE) The eGFR has been calculated using the CKD EPI equation. This  calculation has not been validated in all clinical situations. eGFR's persistently <60 mL/min signify possible Chronic Kidney Disease.    Anion gap 8 5 - 15    ABGS No results for input(s): PHART, PO2ART, TCO2, HCO3 in the last 72 hours.  Invalid input(s): PCO2 CULTURES Recent Results (from the past 240 hour(s))  MRSA PCR Screening     Status: None   Collection Time: 05/14/16  5:40 PM  Result Value Ref Range Status   MRSA by PCR NEGATIVE NEGATIVE Final    Comment:        The GeneXpert MRSA Assay (FDA approved for NASAL specimens only), is one component of a comprehensive MRSA colonization surveillance program. It is not intended to diagnose MRSA infection nor to guide or monitor treatment for MRSA infections.   Respiratory Panel by PCR     Status: None   Collection Time: 05/15/16 10:54 AM  Result Value Ref Range Status   Adenovirus NOT DETECTED NOT DETECTED Final   Coronavirus 229E NOT DETECTED NOT DETECTED Final   Coronavirus  HKU1 NOT DETECTED NOT DETECTED Final   Coronavirus NL63 NOT DETECTED NOT DETECTED Final   Coronavirus OC43 NOT DETECTED NOT DETECTED Final   Metapneumovirus NOT DETECTED NOT DETECTED Final   Rhinovirus / Enterovirus NOT DETECTED NOT DETECTED Final   Influenza A NOT DETECTED NOT DETECTED Final   Influenza B NOT DETECTED NOT DETECTED Final   Parainfluenza Virus 1 NOT DETECTED NOT DETECTED Final   Parainfluenza Virus 2 NOT DETECTED NOT DETECTED Final   Parainfluenza Virus 3 NOT DETECTED NOT DETECTED Final   Parainfluenza Virus 4 NOT DETECTED NOT DETECTED Final   Respiratory Syncytial Virus NOT DETECTED NOT DETECTED Final   Bordetella pertussis NOT DETECTED NOT DETECTED Final   Chlamydophila pneumoniae NOT DETECTED NOT DETECTED Final   Mycoplasma pneumoniae NOT DETECTED NOT DETECTED Final    Comment: Performed at [facility]   Studies/Results: Dg Chest Port 1 View  Result Date: 05/23/2016 CLINICAL DATA:  Shortness of Breath EXAM: PORTABLE CHEST 1 VIEW COMPARISON:  05/21/2016 FINDINGS: Cardiomediastinal silhouette is stable. Hyperinflation again noted. Mild basilar atelectasis or scarring. No segmental infiltrate or pulmonary edema. Stable calcified granuloma right midlung. IMPRESSION: No active disease. Mild basilar atelectasis or scarring. Hyperinflation again noted. Electronically Signed   By: Lahoma Crocker M.D.   On: 05/23/2016 08:40    Medications:  Prior to Admission:  Prescriptions Prior to Admission  Medication Sig Dispense Refill Last Dose  . albuterol (PROAIR HFA) 108 (90 BASE) MCG/ACT inhaler Inhale 2 puffs into the lungs every 6 (six) hours as needed. For shortness of breath   05/13/2016 at Unknown time  . aspirin 81 MG tablet Take 81 mg by mouth daily.   05/13/2016 at Unknown time  . atorvastatin (LIPITOR) 40 MG tablet Take 1 tablet (40 mg total) by mouth daily. (Patient taking differently: Take 40 mg by mouth every evening. ) 90 tablet 3 05/12/2016 at Unknown time  .  azithromycin (ZITHROMAX) 250 MG tablet Take 250 mg by mouth every Monday, Wednesday, and Friday.    05/10/2016  . cetirizine (ZYRTEC) 10 MG tablet Take 10 mg by mouth daily.     05/13/2016 at Unknown time  . diltiazem (CARDIZEM CD) 180 MG 24 hr capsule Take 1 capsule (180 mg total) by mouth daily. 90 capsule 3 05/13/2016 at Unknown time  . escitalopram (LEXAPRO) 20 MG tablet Take 20 mg by mouth daily.     05/13/2016 at Unknown time  .  fluconazole (DIFLUCAN) 150 MG tablet Take 150 mg by mouth once a week.    05/13/2016 at Unknown time  . Fluticasone-Salmeterol (ADVAIR) 250-50 MCG/DOSE AEPB Inhale 1 puff into the lungs 2 (two) times daily.    05/13/2016 at Unknown time  . guaiFENesin (MUCINEX) 600 MG 12 hr tablet Take 1 tablet (600 mg total) by mouth 2 (two) times daily. 20 tablet 0 05/13/2016 at Unknown time  . ibuprofen (ADVIL,MOTRIN) 200 MG tablet Take 400 mg by mouth 2 (two) times daily as needed for mild pain or moderate pain.   05/13/2016 at Unknown time  . ipratropium-albuterol (DUONEB) 0.5-2.5 (3) MG/3ML SOLN Take 3 mLs by nebulization 3 (three) times daily. 360 mL 1 05/13/2016 at Unknown time  . levofloxacin (LEVAQUIN) 750 MG tablet Take 750 mg by mouth daily.   05/13/2016 at Unknown time  . montelukast (SINGULAIR) 10 MG tablet Take 1 tablet (10 mg total) by mouth at bedtime. 30 tablet 1 05/12/2016 at Unknown time  . Multiple Vitamins-Minerals (MULTIVITAMINS THER. W/MINERALS) TABS Take 1 tablet by mouth daily.     05/13/2016 at Unknown time  . oxybutynin (DITROPAN) 5 MG tablet Take 5 mg by mouth daily.    05/13/2016 at Unknown time  . roflumilast (DALIRESP) 500 MCG TABS tablet Take 500 mcg by mouth daily.     05/13/2016 at Unknown time  . SPIRIVA RESPIMAT 2.5 MCG/ACT AERS Inhale 1 puff into the lungs daily.    05/13/2016 at Unknown time  . triamcinolone (NASACORT ALLERGY 24HR) 55 MCG/ACT AERO nasal inhaler Place 2 sprays into the nose daily as needed (FOR ALLERGIES).   UNKNOWN  . [DISCONTINUED] ALPRAZolam (XANAX) 0.5 MG  tablet Take 0.5 mg by mouth daily as needed. anxiety   05/13/2016 at Unknown time  . [DISCONTINUED] clonazePAM (KLONOPIN) 0.5 MG tablet Take 0.5-0.75 mg by mouth at bedtime.    05/12/2016 at Unknown time  . [DISCONTINUED] predniSONE (DELTASONE) 10 MG tablet Take 10 mg by mouth every morning.   HOLD  . predniSONE (DELTASONE) 10 MG tablet Take 60 mg x 3 days then 40 mg x 3 days then 30 mg x 3 days then '20mg'$  x 3 days then resume maintenance dose 10 mg daily (Patient not taking: Reported on 05/13/2016) 60 tablet 2 Not Taking at Unknown time   Scheduled: . aspirin  81 mg Oral Daily  . atorvastatin  40 mg Oral QPM  . chlorpheniramine-HYDROcodone  5 mL Oral Once  . clonazePAM  0.5-0.75 mg Oral QHS  . diltiazem  180 mg Oral Daily  . enoxaparin (LOVENOX) injection  40 mg Subcutaneous Q24H  . escitalopram  20 mg Oral Daily  . fluconazole  100 mg Oral Q Mon-1800  . guaiFENesin  1,200 mg Oral BID  . ipratropium-albuterol  3 mL Nebulization Q6H  . levofloxacin  750 mg Oral QHS  . mouth rinse  15 mL Mouth Rinse BID  . methylPREDNISolone (SOLU-MEDROL) injection  60 mg Intravenous TID  . multivitamin with minerals  1 tablet Oral Daily  . oxybutynin  5 mg Oral Daily  . roflumilast  500 mcg Oral Daily  . sodium chloride flush  3 mL Intravenous Q12H   Continuous:  WRU:[id number], albuterol, ALPRAZolam, diltiazem, fluticasone, ondansetron (ZOFRAN) IV  Assesment: She was admitted with acute on chronic respiratory failure with hypoxia and hypercapnia. She is much improved. She has COPD exacerbation. 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 She is improving. 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 Switch to prednisone    LOS: 10 days   [name] L 05/23/2016, 10:00 AM

## 2016-05-23 NOTE — Progress Notes (Signed)
PROGRESS NOTE    [name]  ZOX:[id number] DOB: [birth year] DOA: 05/13/2016 PCP: Cassell Smiles., MD   Brief Narrative: 61 y.o. female with hx of severe COPD, prior smoker, on 4L [place] home oxygen, hx of CAD, hx of SVT, HLD, presented to the ER with increase SOB. She has been on chronic steroid, and was found to have a marked leukocytosis with WBC of 18K.Evaluation included a CXR showing no infiltrate, and her bicarb was elevated to 35's. Her ABG showed 7.2/99/pOx=119 of 50%FIO2. She was placed on Bipap. She has advanced and terminal COPD  Assessment & Plan:  # Acute on chronic hypoxic and hypercapnic respiratory failure in the setting of advanced COPD, COPD exacerbation: -Patient is still short of breath and hypoxic requiring about 6-7 L of oxygen. She uses about 4 L of oxygen at home. She has BiPAP at night and chronically on oral steroid. -Patient with no significant clinical improvement. 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 Solu-Medrol  IV discontinued and changed to oral prednisone. Repeat chest x-ray today with no focal finding. Continue  Daliresp, bronchodilators and breathing treatment. Patient completed 10 days of oral Levaquin therefore discontinued today. -Pulmonary consult appreciated. I discussed with Dr. Juanetta Gosling from pulmonology today.  #History of coronary artery disease and SVT: The patient is on diltiazem. Continue to monitor heart rate. She has no chest pain.  #Presumed mild acute on chronic diastolic CHF: Received a dose of Lasix on 1/16. The chest x-ray with no pulmonary edema. Hold diuretics for now. Left ventricular EF of 60% in February 2017.  #Active smoking, tobacco dependence: Patient was counseled.  #Anxiety depression: Continue Lexapro and xanax as needed.  # Generalized weakness likely physical deconditioning in the setting of medical comorbidities: TSH level acceptable. PT, OT evaluation and treatment. Patient likely needs  rehabilitation on discharge. Social worker eval is ongoing.  Principal Problem:   Acute respiratory failure with hypercapnia (HCC) Active Problems:   Hyperlipidemia   COPD exacerbation (HCC)   Acute on chronic respiratory failure (HCC)   Chronic respiratory failure with hypoxia (HCC)   Leukocytosis   Muscle weakness (generalized)  DVT prophylaxis: Lovenox subcutaneous. Code Status: Full code Family Communication: Patient's sister at bedside. Discussed with her in detail. Disposition Plan: Currently in  stepdown.  Consultants:   Pulmonologist  Procedures: None Antimicrobials: Levaquin  Subjective: Patient was seen and examined at bedside. Patient was confused this morning. Reported shortness of breath and weakness. Denied headache, dizziness, chest pain. Sister at bedside. Review of systems Limited. Objective: Vitals:   05/23/16 0500 05/23/16 0600 05/23/16 0728 05/23/16 0800  BP: 132/71 121/79  123/74  Pulse: 99 (!) 117  (!) 110  Resp: 20 (!) 28  (!) 28  Temp:    98.5 F (36.9 C)  TempSrc:    Oral  SpO2: 91% (!) 88% 95% 92%  Weight: 52.3 kg (115 lb 4.8 oz)     Height:        Intake/Output Summary (Last 24 hours) at 05/23/16 1138 Last data filed at 05/23/16 0800  Gross per 24 hour  Intake                3 ml  Output             1500 ml  Net            -1497 ml   Filed Weights   05/21/16 0451 05/22/16 0500 05/23/16 0500  Weight: 54.3 kg (119 lb 11.4  oz) 52.3 kg (115 lb 4.8 oz) 52.3 kg (115 lb 4.8 oz)    Examination:  General exam: Ill-looking female lying on bed, not in distress.  Respiratory system: Bilateral diffuse expiratory wheeze, basal crackle.  Cardiovascular system: Tachycardic, S1-S2 heard, regular. No murmur Gastrointestinal system: Abdomen is nondistended, soft and nontender. Normal bowel sounds heard. Central nervous system: Alert awake, following commands. Extremities: Symmetric 5 x 5 power. Skin: No rashes, lesions or ulcers Psychiatry:  Judgement and insight appear impaired     Data Reviewed: I have personally reviewed following labs and imaging studies  CBC:  Recent Labs Lab 05/20/16 0420 05/23/16 0429  WBC 19.7* 18.6*  HGB 13.3 13.5  HCT 42.5 43.6  MCV 105.7* 106.1*  PLT 381 315   Basic Metabolic Panel:  Recent Labs Lab 05/20/16 0420 05/22/16 0556 05/23/16 0429  NA 138 138 139  K 4.3 5.2* 5.3*  CL 88* 83* 83*  CO2 45* 47* 48*  GLUCOSE 176* 196* 254*  BUN 19 18 18   CREATININE 0.55 0.63 0.60  CALCIUM 8.1* 8.3* 8.1*   GFR: Estimated Creatinine Clearance: 60.6 mL/min (by C-G formula based on SCr of 0.6 mg/dL). Liver Function Tests: No results for input(s): AST, ALT, ALKPHOS, BILITOT, PROT, ALBUMIN in the last 168 hours. No results for input(s): LIPASE, AMYLASE in the last 168 hours. No results for input(s): AMMONIA in the last 168 hours. Coagulation Profile: No results for input(s): INR, PROTIME in the last 168 hours. Cardiac Enzymes: No results for input(s): CKTOTAL, CKMB, CKMBINDEX, TROPONINI in the last 168 hours. BNP (last 3 results) No results for input(s): PROBNP in the last 8760 hours. HbA1C: No results for input(s): HGBA1C in the last 72 hours. CBG: No results for input(s): GLUCAP in the last 168 hours. Lipid Profile: No results for input(s): CHOL, HDL, LDLCALC, TRIG, CHOLHDL, LDLDIRECT in the last 72 hours. Thyroid Function Tests: No results for input(s): TSH, T4TOTAL, FREET4, T3FREE, THYROIDAB in the last 72 hours. Anemia Panel: No results for input(s): VITAMINB12, FOLATE, FERRITIN, TIBC, IRON, RETICCTPCT in the last 72 hours. Sepsis Labs: No results for input(s): PROCALCITON, LATICACIDVEN in the last 168 hours.  Recent Results (from the past 240 hour(s))  MRSA PCR Screening     Status: None   Collection Time: 05/14/16  5:40 PM  Result Value Ref Range Status   MRSA by PCR NEGATIVE NEGATIVE Final    Comment:        The GeneXpert MRSA Assay (FDA approved for NASAL  specimens only), is one component of a comprehensive MRSA colonization surveillance program. It is not intended to diagnose MRSA infection nor to guide or monitor treatment for MRSA infections.   Respiratory Panel by PCR     Status: None   Collection Time: 05/15/16 10:54 AM  Result Value Ref Range Status   Adenovirus NOT DETECTED NOT DETECTED Final   Coronavirus 229E NOT DETECTED NOT DETECTED Final   Coronavirus HKU1 NOT DETECTED NOT DETECTED Final   Coronavirus NL63 NOT DETECTED NOT DETECTED Final   Coronavirus OC43 NOT DETECTED NOT DETECTED Final   Metapneumovirus NOT DETECTED NOT DETECTED Final   Rhinovirus / Enterovirus NOT DETECTED NOT DETECTED Final   Influenza A NOT DETECTED NOT DETECTED Final   Influenza B NOT DETECTED NOT DETECTED Final   Parainfluenza Virus 1 NOT DETECTED NOT DETECTED Final   Parainfluenza Virus 2 NOT DETECTED NOT DETECTED Final   Parainfluenza Virus 3 NOT DETECTED NOT DETECTED Final   Parainfluenza Virus 4 NOT DETECTED NOT  DETECTED Final   Respiratory Syncytial Virus NOT DETECTED NOT DETECTED Final   Bordetella pertussis NOT DETECTED NOT DETECTED Final   Chlamydophila pneumoniae NOT DETECTED NOT DETECTED Final   Mycoplasma pneumoniae NOT DETECTED NOT DETECTED Final    Comment: Performed at [facility]         Radiology Studies: Dg Chest Port 1 View  Result Date: 05/23/2016 CLINICAL DATA:  Shortness of Breath EXAM: PORTABLE CHEST 1 VIEW COMPARISON:  05/21/2016 FINDINGS: Cardiomediastinal silhouette is stable. Hyperinflation again noted. Mild basilar atelectasis or scarring. No segmental infiltrate or pulmonary edema. Stable calcified granuloma right midlung. IMPRESSION: No active disease. Mild basilar atelectasis or scarring. Hyperinflation again noted. Electronically Signed   By: Natasha Mead M.D.   On: 05/23/2016 08:40        Scheduled Meds: . aspirin  81 mg Oral Daily  . atorvastatin  40 mg Oral QPM  . chlorpheniramine-HYDROcodone   5 mL Oral Once  . clonazePAM  0.5-0.75 mg Oral QHS  . diltiazem  180 mg Oral Daily  . enoxaparin (LOVENOX) injection  40 mg Subcutaneous Q24H  . escitalopram  20 mg Oral Daily  . fluconazole  100 mg Oral Q Mon-1800  . guaiFENesin  1,200 mg Oral BID  . ipratropium-albuterol  3 mL Nebulization Q6H  . mouth rinse  15 mL Mouth Rinse BID  . multivitamin with minerals  1 tablet Oral Daily  . oxybutynin  5 mg Oral Daily  . [START ON 05/24/2016] predniSONE  40 mg Oral Q breakfast  . roflumilast  500 mcg Oral Daily  . sodium chloride flush  3 mL Intravenous Q12H   Continuous Infusions:    LOS: 10 days    Armoni Kludt Jaynie Collins, MD [facility] Pager [phone]  If 7PM-7AM, please contact night-coverage [email or web address] Password [facility] 05/23/2016, 11:38 AM

## 2016-05-23 NOTE — Progress Notes (Signed)
Patient placed back on hospital V60 BIPAP for the night. Failed to maintain O2 saturation on home machine. Patient is still using her home mask though. Discussed with patient that she would have to get home health company to turn home machine pressure levels up.

## 2016-05-24 DIAGNOSIS — A609 Anogenital herpesviral infection, unspecified: Secondary | ICD-10-CM

## 2016-05-24 DIAGNOSIS — R5381 Other malaise: Secondary | ICD-10-CM

## 2016-05-24 LAB — CBC
HCT: 42.6 % (ref 36.0–46.0)
Hemoglobin: 13.1 g/dL (ref 12.0–15.0)
MCH: 32.8 pg (ref 26.0–34.0)
MCHC: 30.8 g/dL (ref 30.0–36.0)
MCV: 106.5 fL — ABNORMAL HIGH (ref 78.0–100.0)
PLATELETS: 254 10*3/uL (ref 150–400)
RBC: 4 MIL/uL (ref 3.87–5.11)
RDW: 12.2 % (ref 11.5–15.5)
WBC: 28.7 10*3/uL — ABNORMAL HIGH (ref 4.0–10.5)

## 2016-05-24 LAB — BASIC METABOLIC PANEL
BUN: 19 mg/dL (ref 6–20)
CALCIUM: 7.9 mg/dL — AB (ref 8.9–10.3)
CHLORIDE: 82 mmol/L — AB (ref 101–111)
CO2: >50 mmol/L — ABNORMAL HIGH (ref 22–32)
CREATININE: 0.74 mg/dL (ref 0.44–1.00)
GFR calc non Af Amer: >60 mL/min (ref >60)
Glucose, Bld: 321 mg/dL — ABNORMAL HIGH (ref 65–99)
Potassium: 5 mmol/L (ref 3.5–5.1)
SODIUM: 136 mmol/L (ref 135–145)

## 2016-05-24 LAB — GLUCOSE, CAPILLARY: GLUCOSE-CAPILLARY: 151 mg/dL — AB (ref 65–99)

## 2016-05-24 MED ORDER — DIPHENHYDRAMINE HCL 50 MG/ML IJ SOLN
12.5000 mg | Freq: Once | INTRAMUSCULAR | Status: AC
  Administered 2016-05-24: 12.5 mg via INTRAVENOUS
  Filled 2016-05-24: qty 1

## 2016-05-24 MED ORDER — ACYCLOVIR 200 MG PO CAPS
400.0000 mg | ORAL_CAPSULE | Freq: Three times a day (TID) | ORAL | Status: DC
  Administered 2016-05-24 – 2016-05-28 (×14): 400 mg via ORAL
  Filled 2016-05-24 (×15): qty 2

## 2016-05-24 NOTE — Progress Notes (Signed)
Patient noted to have sores on bilateral buttocks that she reports to be "shingles" and stated "I take something at home to prevent breakouts but they've taken me off of it since I've been sick." MD notified.

## 2016-05-24 NOTE — Progress Notes (Signed)
PROGRESS NOTE    [name]  UJW:[id number] DOB: 01-15-1956 DOA: 05/13/2016 PCP: Cassell Smiles., MD   Brief Narrative: 61 y.o. female with hx of severe COPD, prior smoker, on 4L [place] home oxygen, hx of CAD, hx of SVT, HLD, presented to the ER with increase SOB. She has been on chronic steroid, and was found to have a marked leukocytosis with WBC of 18K.Evaluation included a CXR showing no infiltrate, and her bicarb was elevated to 35's. Her ABG showed 7.2/99/pOx=119 of 50%FIO2. She was placed on Bipap. She has advanced and terminal COPD  Assessment & Plan:  # Acute on chronic hypoxic and hypercapnic respiratory failure in the setting of advanced COPD, COPD exacerbation: -Patient is requiring about 6-7 L of oxygen. The requirement of oxygen increases with exertion because of desaturation. Patient uses about 4 L of oxygen at home. She also uses BiPAP however, the home BiPAP machine is not delivering oxygen. I discussed with the social worker regarding the issue. Likely needs to talk to home care agency to arrange for BiPAP patient at home. We will try to wean down oxygen slowly as tolerated. I discussed with the patient's nurse. Patient is chronically on oral steroid. - the patient completed 10 days course of oral Levaquin. 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 Mental status improved today. Continue Daliresp, bronchodilators and breathing treatment.  -Pulmonary consult appreciated.   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 Patient is also on oral fluconazole weekly.  #History of coronary artery disease and SVT: The patient is on diltiazem. Continue to monitor heart rate. She has no chest pain.  #Presumed mild acute on chronic diastolic CHF: Received a dose of Lasix on 1/16. The chest x-ray with no pulmonary edema. Hold diuretics for now. Left ventricular EF of 60% in February 2017.  #Active smoking, tobacco dependence:  Patient was counseled.  #Anxiety depression: Continue Lexapro and xanax as needed.  # Generalized weakness likely physical deconditioning in the setting of medical comorbidities: TSH level acceptable. PT, OT evaluation and treatment. Social worker following for possible rehabilitation discharge. If unable to go rehabilitation patient will go home with home care services. Principal Problem:   Acute respiratory failure with hypercapnia (HCC) Active Problems:   Hyperlipidemia   COPD exacerbation (HCC)   Acute on chronic respiratory failure (HCC)   Chronic respiratory failure with hypoxia (HCC)   Leukocytosis   Muscle weakness (generalized)  DVT prophylaxis: Lovenox subcutaneous. Code Status: Full code Family Communication: I discussed with the patient's sister at bedside. I also called and discussed with patient's daughter who is a physician Dr Clent Demark (phone [phone]) Disposition Plan: Currently in  stepdown. Rehabilitation versus home with home care.  Consultants:   Pulmonologist  Procedures: None Antimicrobials: Levaquin completed 10 days course  Subjective: Patient was seen and examined at bedside. Patient was confused this morning. Reported shortness of breath and weakness. Denied headache, dizziness, chest pain. Sister at bedside. Review of systems Limited. Objective: Vitals:   05/24/16 0756 05/24/16 0800 05/24/16 0900 05/24/16 1000  BP:  139/71 131/75 130/63  Pulse: 97 94 98 (!) 102  Resp: (!) 30 15 16 15   Temp:      TempSrc:      SpO2: 94% 93% 94% 94%  Weight:      Height:        Intake/Output Summary (Last 24 hours) at 05/24/16 1109 Last data filed at 05/24/16 1044  Gross per 24 hour  Intake  0 ml  Output             1850 ml  Net            -1850 ml   Filed Weights   05/22/16 0500 05/23/16 0500 05/24/16 0500  Weight: 52.3 kg (115 lb 4.8 oz) 52.3 kg (115 lb 4.8 oz) 51.5 kg (113 lb 8.6 oz)    Examination:  General exam: Ill-looking  female lying on bed comfortable, looks more comfortable than yesterday.  Respiratory system: Bilateral diffuse expiratory wheeze and basilar crackles unchanged  Cardiovascular system: Regular rate rhythm, S1 and S2 normal  Gastrointestinal system: Abdomen is nondistended, soft and nontender. Normal bowel sounds heard. Central nervous system: Alert awake, following commands. Extremities: Symmetric 5 x 5 power. Skin: No rashes, lesions or ulcers Psychiatry: Judgement and insight appear impaired  Back: Open crusted herpetic lesions around the sacral region   Data Reviewed: I have personally reviewed following labs and imaging studies  CBC:  Recent Labs Lab 05/20/16 0420 05/23/16 0429 05/24/16 0922  WBC 19.7* 18.6* 28.7*  HGB 13.3 13.5 13.1  HCT 42.5 43.6 42.6  MCV 105.7* 106.1* 106.5*  PLT 381 315 254   Basic Metabolic Panel:  Recent Labs Lab 05/20/16 0420 05/22/16 0556 05/23/16 0429 05/24/16 0922  NA 138 138 139 136  K 4.3 5.2* 5.3* 5.0  CL 88* 83* 83* 82*  CO2 45* 47* 48* >50*  GLUCOSE 176* 196* 254* 321*  BUN 19 18 18 19   CREATININE 0.55 0.63 0.60 0.74  CALCIUM 8.1* 8.3* 8.1* 7.9*   GFR: Estimated Creatinine Clearance: 60.6 mL/min (by C-G formula based on SCr of 0.74 mg/dL). Liver Function Tests: No results for input(s): AST, ALT, ALKPHOS, BILITOT, PROT, ALBUMIN in the last 168 hours. No results for input(s): LIPASE, AMYLASE in the last 168 hours. No results for input(s): AMMONIA in the last 168 hours. Coagulation Profile: No results for input(s): INR, PROTIME in the last 168 hours. Cardiac Enzymes: No results for input(s): CKTOTAL, CKMB, CKMBINDEX, TROPONINI in the last 168 hours. BNP (last 3 results) No results for input(s): PROBNP in the last 8760 hours. HbA1C: No results for input(s): HGBA1C in the last 72 hours. CBG: No results for input(s): GLUCAP in the last 168 hours. Lipid Profile: No results for input(s): CHOL, HDL, LDLCALC, TRIG, CHOLHDL,  LDLDIRECT in the last 72 hours. Thyroid Function Tests: No results for input(s): TSH, T4TOTAL, FREET4, T3FREE, THYROIDAB in the last 72 hours. Anemia Panel: No results for input(s): VITAMINB12, FOLATE, FERRITIN, TIBC, IRON, RETICCTPCT in the last 72 hours. Sepsis Labs: No results for input(s): PROCALCITON, LATICACIDVEN in the last 168 hours.  Recent Results (from the past 240 hour(s))  MRSA PCR Screening     Status: None   Collection Time: 05/14/16  5:40 PM  Result Value Ref Range Status   MRSA by PCR NEGATIVE NEGATIVE Final    Comment:        The GeneXpert MRSA Assay (FDA approved for NASAL specimens only), is one component of a comprehensive MRSA colonization surveillance program. It is not intended to diagnose MRSA infection nor to guide or monitor treatment for MRSA infections.   Respiratory Panel by PCR     Status: None   Collection Time: 05/15/16 10:54 AM  Result Value Ref Range Status   Adenovirus NOT DETECTED NOT DETECTED Final   Coronavirus 229E NOT DETECTED NOT DETECTED Final   Coronavirus HKU1 NOT DETECTED NOT DETECTED Final   Coronavirus NL63 NOT DETECTED NOT DETECTED  Final   Coronavirus OC43 NOT DETECTED NOT DETECTED Final   Metapneumovirus NOT DETECTED NOT DETECTED Final   Rhinovirus / Enterovirus NOT DETECTED NOT DETECTED Final   Influenza A NOT DETECTED NOT DETECTED Final   Influenza B NOT DETECTED NOT DETECTED Final   Parainfluenza Virus 1 NOT DETECTED NOT DETECTED Final   Parainfluenza Virus 2 NOT DETECTED NOT DETECTED Final   Parainfluenza Virus 3 NOT DETECTED NOT DETECTED Final   Parainfluenza Virus 4 NOT DETECTED NOT DETECTED Final   Respiratory Syncytial Virus NOT DETECTED NOT DETECTED Final   Bordetella pertussis NOT DETECTED NOT DETECTED Final   Chlamydophila pneumoniae NOT DETECTED NOT DETECTED Final   Mycoplasma pneumoniae NOT DETECTED NOT DETECTED Final    Comment: Performed at [facility]         Radiology Studies: Dg Chest Port  1 View  Result Date: 05/23/2016 CLINICAL DATA:  Shortness of Breath EXAM: PORTABLE CHEST 1 VIEW COMPARISON:  05/21/2016 FINDINGS: Cardiomediastinal silhouette is stable. Hyperinflation again noted. Mild basilar atelectasis or scarring. No segmental infiltrate or pulmonary edema. Stable calcified granuloma right midlung. IMPRESSION: No active disease. Mild basilar atelectasis or scarring. Hyperinflation again noted. Electronically Signed   By: Natasha Mead M.D.   On: 05/23/2016 08:40        Scheduled Meds: . acyclovir  400 mg Oral TID  . aspirin  81 mg Oral Daily  . atorvastatin  40 mg Oral QPM  . chlorpheniramine-HYDROcodone  5 mL Oral Once  . clonazePAM  0.5-0.75 mg Oral QHS  . diltiazem  180 mg Oral Daily  . enoxaparin (LOVENOX) injection  40 mg Subcutaneous Q24H  . escitalopram  20 mg Oral Daily  . fluconazole  100 mg Oral Q Mon-1800  . guaiFENesin  1,200 mg Oral BID  . ipratropium-albuterol  3 mL Nebulization TID  . mouth rinse  15 mL Mouth Rinse BID  . multivitamin with minerals  1 tablet Oral Daily  . oxybutynin  5 mg Oral Daily  . predniSONE  40 mg Oral Q breakfast  . roflumilast  500 mcg Oral Daily  . sodium chloride flush  3 mL Intravenous Q12H   Continuous Infusions:    LOS: 11 days    Dagmar Adcox Jaynie Collins, MD [facility] Pager [phone]  If 7PM-7AM, please contact night-coverage [email or web address] Password [email or web address] 05/24/2016, 11:09 AM

## 2016-05-24 NOTE — Progress Notes (Signed)
Physical Therapy Treatment Patient Details Name: [name] MRN: [id number] DOB: [birth year] Today's Date: 05/24/2016    History of Present Illness 61 y.o. female with hx of severe COPD, prior smoker, on 4L [place] home oxygen, hx of CAD, hx of SVT, HLD, presented to the ER with increase SOB.  She has been on chronic steroid, and  was found to have a marked leukocytosis with WBC of 18K.   Evaluation included a CXR showing no infiltrate, and her bicarb was elevated to 35's.  Her ABG showed 7.2/99/pOx=119 of 50%FIO2. She was placed on Bipap, and repeat ABG did not change significantly.  Consideration for intubation, however, she appeared well and had significant improvement, so it was deferred.  Hospitalist was asked to admit her for further evaluation and tx.  I remember her as I had taken care of her before.  She sees Dr Egbert Garibaldi of pulmonary outpatient, and she had seen Dr Blase Mess shortly during her last bout. We will bring her in for COPD exacerbation, though she was not in significant respiratory distress.  She does use her CPAP/Bipap every night at home.     PT Comments    Pt received in bed, and was marginally agreeable to PT tx.  Pt seems slightly irritated, telling PT that she does not have the correct equipment to keep her SpO2 up, and that no one knows what they are doing in regards to her oxygen.  Pt was only willing to ambulate 52ft from the bed<>BSC with supervision/min guard, and she demonstrated SpO2 desaturation to 84-85% on 6L HFNC.  She was then able to complete a few exercises in the bed, but she quickly fatigues.  Continue to recommend SNF vs HHPT with 24/7 supervision/assistance.  Pt needs encouragement to increase her upright tolerance.     Follow Up Recommendations  SNF;Supervision/Assistance - 24 hour;Home health PT     Equipment Recommendations  Wheelchair (measurements PT);Wheelchair cushion (measurements PT)    Recommendations for Other Services       Precautions /  Restrictions Precautions Precautions: Fall Precaution Comments: Due to immobility  Restrictions Weight Bearing Restrictions: No    Mobility  Bed Mobility Overal bed mobility: Modified Independent                Transfers Overall transfer level: Modified independent Equipment used: None Transfers: Sit to/from Stand Sit to Stand: Min guard;Supervision            Ambulation/Gait Ambulation/Gait assistance: Supervision;Min guard Ambulation Distance (Feet): 20 Feet Assistive device: None Gait Pattern/deviations: Trunk flexed;Shuffle     General Gait Details: Pt continues to desaturate to 84-85% during mobility while on 6L HFNC.  Despite seated rest break, she is unable to recover to 90% unless she returns to sidelying in the bed.     Stairs            Wheelchair Mobility    Modified Rankin (Stroke Patients Only)       Balance Overall balance assessment: Needs assistance Sitting-balance support: Bilateral upper extremity supported;Feet unsupported       Standing balance support: No upper extremity supported Standing balance-Leahy Scale: Fair                      Cognition Arousal/Alertness: Awake/alert Behavior During Therapy: Impulsive;Anxious Overall Cognitive Status: Within Functional Limits for tasks assessed                      Exercises Other Exercises Other  Exercises: Clams shell x 10 reps bilaterally Other Exercises: Sidelying thoracic rotation x 10 reps bilaterally    General Comments        Pertinent Vitals/Pain Pain Assessment: No/denies pain    Home Living                      Prior Function            PT Goals (current goals can now be found in the care plan section) Acute Rehab PT Goals Patient Stated Goal: To go home.  PT Goal Formulation: With patient/family Time For Goal Achievement: 05/29/16 Potential to Achieve Goals: Good Progress towards PT goals: Progressing toward goals (Very slowly  and limited due to SpO2 desaturation)    Frequency    Min 3X/week      PT Plan Current plan remains appropriate    Co-evaluation             End of Session Equipment Utilized During Treatment: Oxygen Activity Tolerance: Patient limited by fatigue Patient left: in bed;with call bell/phone within reach;with family/visitor present     Time:  -     Charges:                       G Codes:     Beth Tylesha Gibeault, PT, DPT X: [id number]

## 2016-05-24 NOTE — Clinical Social Work Note (Signed)
CSW spoke with patient and sister. CSW was advised to send patient's clinicals to all facilities in [place] that had rehab/rehabilitation in the name.    [name], Clydene Pugh, LCSW

## 2016-05-24 NOTE — Progress Notes (Signed)
Subjective: She says she feels better. According to family at bedside she's not as confused. She used her BiPAP machine from home last night but he did not hold her O2 sats so are going to need to make adjustments with her settings. On the hospital supply BiPAP machine she did okay. When I went into the room she was on BiPAP and I has disconnected her and put her on nasal oxygen. On 6 L nasal oxygen her O2 sat is in the mid 90s so we may be able to taper that down. She doesn't have any other complaints. According to family at bedside she had a lot of itching last night but the patient doesn't complain of any pruritus now. No nausea vomiting diarrhea. No chest pain. As mentioned less confused  Objective: Vital signs in last 24 hours: Temp:  [97.8 F (36.6 C)-98.5 F (36.9 C)] 98.5 F (36.9 C) (01/19 0400) Pulse Rate:  [80-122] 85 (01/19 0600) Resp:  [13-44] 26 (01/19 0600) BP: (113-143)/(61-84) 124/74 (01/19 0600) SpO2:  [85 %-99 %] 94 % (01/19 0600) Weight:  [51.5 kg (113 lb 8.6 oz)] 51.5 kg (113 lb 8.6 oz) (01/19 0500) Weight change: -0.8 kg (-1 lb 12.2 oz) Last BM Date: 05/21/16  Intake/Output from previous day: 01/18 0701 - 01/19 0700 In: -  Out: 2600 [Urine:2600]  PHYSICAL EXAM General appearance: alert, cooperative and mild distress Resp: Diminished breath sounds with prolonged expiratory phase Cardio: regular rate and rhythm, S1, S2 normal, no murmur, click, rub or gallop GI: soft, non-tender; bowel sounds normal; no masses,  no organomegaly Extremities: extremities normal, atraumatic, no cyanosis or edema Skin warm and dry. Pupils react  Lab Results:  Results for orders placed or performed during the hospital encounter of 05/13/16 (from the past 48 hour(s))  CBC     Status: Abnormal   Collection Time: 05/23/16  4:29 AM  Result Value Ref Range   WBC 18.6 (H) 4.0 - 10.5 K/uL   RBC 4.11 3.87 - 5.11 MIL/uL   Hemoglobin 13.5 12.0 - 15.0 g/dL   HCT 43.6 36.0 - 46.0 %   MCV  106.1 (H) 78.0 - 100.0 fL   MCH 32.8 26.0 - 34.0 pg   MCHC 31.0 30.0 - 36.0 g/dL   RDW 12.3 11.5 - 15.5 %   Platelets 315 150 - 400 K/uL  Basic metabolic panel     Status: Abnormal   Collection Time: 05/23/16  4:29 AM  Result Value Ref Range   Sodium 139 135 - 145 mmol/L   Potassium 5.3 (H) 3.5 - 5.1 mmol/L   Chloride 83 (L) 101 - 111 mmol/L   CO2 48 (H) 22 - 32 mmol/L   Glucose, Bld 254 (H) 65 - 99 mg/dL   BUN 18 6 - 20 mg/dL   Creatinine, Ser 0.60 0.44 - 1.00 mg/dL   Calcium 8.1 (L) 8.9 - 10.3 mg/dL   GFR calc non Af Amer >60 >60 mL/min   GFR calc Af Amer >60 >60 mL/min    Comment: (NOTE) The eGFR has been calculated using the CKD EPI equation. This calculation has not been validated in all clinical situations. eGFR's persistently <60 mL/min signify possible Chronic Kidney Disease.    Anion gap 8 5 - 15    ABGS No results for input(s): PHART, PO2ART, TCO2, HCO3 in the last 72 hours.  Invalid input(s): PCO2 CULTURES Recent Results (from the past 240 hour(s))  MRSA PCR Screening     Status: None   Collection  Time: 05/14/16  5:40 PM  Result Value Ref Range Status   MRSA by PCR NEGATIVE NEGATIVE Final    Comment:        The GeneXpert MRSA Assay (FDA approved for NASAL specimens only), is one component of a comprehensive MRSA colonization surveillance program. It is not intended to diagnose MRSA infection nor to guide or monitor treatment for MRSA infections.   Respiratory Panel by PCR     Status: None   Collection Time: 05/15/16 10:54 AM  Result Value Ref Range Status   Adenovirus NOT DETECTED NOT DETECTED Final   Coronavirus 229E NOT DETECTED NOT DETECTED Final   Coronavirus HKU1 NOT DETECTED NOT DETECTED Final   Coronavirus NL63 NOT DETECTED NOT DETECTED Final   Coronavirus OC43 NOT DETECTED NOT DETECTED Final   Metapneumovirus NOT DETECTED NOT DETECTED Final   Rhinovirus / Enterovirus NOT DETECTED NOT DETECTED Final   Influenza A NOT DETECTED NOT DETECTED  Final   Influenza B NOT DETECTED NOT DETECTED Final   Parainfluenza Virus 1 NOT DETECTED NOT DETECTED Final   Parainfluenza Virus 2 NOT DETECTED NOT DETECTED Final   Parainfluenza Virus 3 NOT DETECTED NOT DETECTED Final   Parainfluenza Virus 4 NOT DETECTED NOT DETECTED Final   Respiratory Syncytial Virus NOT DETECTED NOT DETECTED Final   Bordetella pertussis NOT DETECTED NOT DETECTED Final   Chlamydophila pneumoniae NOT DETECTED NOT DETECTED Final   Mycoplasma pneumoniae NOT DETECTED NOT DETECTED Final    Comment: Performed at [facility]   Studies/Results: Dg Chest Port 1 View  Result Date: 05/23/2016 CLINICAL DATA:  Shortness of Breath EXAM: PORTABLE CHEST 1 VIEW COMPARISON:  05/21/2016 FINDINGS: Cardiomediastinal silhouette is stable. Hyperinflation again noted. Mild basilar atelectasis or scarring. No segmental infiltrate or pulmonary edema. Stable calcified granuloma right midlung. IMPRESSION: No active disease. Mild basilar atelectasis or scarring. Hyperinflation again noted. Electronically Signed   By: Lahoma Crocker M.D.   On: 05/23/2016 08:40    Medications:  Prior to Admission:  Prescriptions Prior to Admission  Medication Sig Dispense Refill Last Dose  . albuterol (PROAIR HFA) 108 (90 BASE) MCG/ACT inhaler Inhale 2 puffs into the lungs every 6 (six) hours as needed. For shortness of breath   05/13/2016 at Unknown time  . aspirin 81 MG tablet Take 81 mg by mouth daily.   05/13/2016 at Unknown time  . atorvastatin (LIPITOR) 40 MG tablet Take 1 tablet (40 mg total) by mouth daily. (Patient taking differently: Take 40 mg by mouth every evening. ) 90 tablet 3 05/12/2016 at Unknown time  . azithromycin (ZITHROMAX) 250 MG tablet Take 250 mg by mouth every Monday, Wednesday, and Friday.    05/10/2016  . cetirizine (ZYRTEC) 10 MG tablet Take 10 mg by mouth daily.     05/13/2016 at Unknown time  . diltiazem (CARDIZEM CD) 180 MG 24 hr capsule Take 1 capsule (180 mg total) by mouth daily. 90  capsule 3 05/13/2016 at Unknown time  . escitalopram (LEXAPRO) 20 MG tablet Take 20 mg by mouth daily.     05/13/2016 at Unknown time  . fluconazole (DIFLUCAN) 150 MG tablet Take 150 mg by mouth once a week.    05/13/2016 at Unknown time  . Fluticasone-Salmeterol (ADVAIR) 250-50 MCG/DOSE AEPB Inhale 1 puff into the lungs 2 (two) times daily.    05/13/2016 at Unknown time  . guaiFENesin (MUCINEX) 600 MG 12 hr tablet Take 1 tablet (600 mg total) by mouth 2 (two) times daily. 20 tablet 0 05/13/2016  at Unknown time  . ibuprofen (ADVIL,MOTRIN) 200 MG tablet Take 400 mg by mouth 2 (two) times daily as needed for mild pain or moderate pain.   05/13/2016 at Unknown time  . ipratropium-albuterol (DUONEB) 0.5-2.5 (3) MG/3ML SOLN Take 3 mLs by nebulization 3 (three) times daily. 360 mL 1 05/13/2016 at Unknown time  . levofloxacin (LEVAQUIN) 750 MG tablet Take 750 mg by mouth daily.   05/13/2016 at Unknown time  . montelukast (SINGULAIR) 10 MG tablet Take 1 tablet (10 mg total) by mouth at bedtime. 30 tablet 1 05/12/2016 at Unknown time  . Multiple Vitamins-Minerals (MULTIVITAMINS THER. W/MINERALS) TABS Take 1 tablet by mouth daily.     05/13/2016 at Unknown time  . oxybutynin (DITROPAN) 5 MG tablet Take 5 mg by mouth daily.    05/13/2016 at Unknown time  . roflumilast (DALIRESP) 500 MCG TABS tablet Take 500 mcg by mouth daily.     05/13/2016 at Unknown time  . SPIRIVA RESPIMAT 2.5 MCG/ACT AERS Inhale 1 puff into the lungs daily.    05/13/2016 at Unknown time  . triamcinolone (NASACORT ALLERGY 24HR) 55 MCG/ACT AERO nasal inhaler Place 2 sprays into the nose daily as needed (FOR ALLERGIES).   UNKNOWN  . [DISCONTINUED] ALPRAZolam (XANAX) 0.5 MG tablet Take 0.5 mg by mouth daily as needed. anxiety   05/13/2016 at Unknown time  . [DISCONTINUED] clonazePAM (KLONOPIN) 0.5 MG tablet Take 0.5-0.75 mg by mouth at bedtime.    05/12/2016 at Unknown time  . [DISCONTINUED] predniSONE (DELTASONE) 10 MG tablet Take 10 mg by mouth every morning.   HOLD  .  predniSONE (DELTASONE) 10 MG tablet Take 60 mg x 3 days then 40 mg x 3 days then 30 mg x 3 days then 78m x 3 days then resume maintenance dose 10 mg daily (Patient not taking: Reported on 05/13/2016) 60 tablet 2 Not Taking at Unknown time   Scheduled: . aspirin  81 mg Oral Daily  . atorvastatin  40 mg Oral QPM  . chlorpheniramine-HYDROcodone  5 mL Oral Once  . clonazePAM  0.5-0.75 mg Oral QHS  . diltiazem  180 mg Oral Daily  . enoxaparin (LOVENOX) injection  40 mg Subcutaneous Q24H  . escitalopram  20 mg Oral Daily  . fluconazole  100 mg Oral Q Mon-1800  . guaiFENesin  1,200 mg Oral BID  . ipratropium-albuterol  3 mL Nebulization TID  . mouth rinse  15 mL Mouth Rinse BID  . multivitamin with minerals  1 tablet Oral Daily  . oxybutynin  5 mg Oral Daily  . predniSONE  40 mg Oral Q breakfast  . roflumilast  500 mcg Oral Daily  . sodium chloride flush  3 mL Intravenous Q12H   Continuous:  PEPP:IRJJOACZYSAYT albuterol, ALPRAZolam, diltiazem, fluticasone, ondansetron (ZOFRAN) IV  Assesment: She was admitted with acute on chronic hypoxic/hypercapnic respiratory failure. She is slowly improving. She has had some confusion that I think is probably a combination of being in the intensive care unit and steroid psychosis. Her Solu-Medrol was stopped yesterday and she'll start on oral prednisone today. She is on chronic prednisone at home. Her home BiPAP machine did not hold her oxygen saturation last night so we'll need to make some adjustments in her settings. Principal Problem:   Acute respiratory failure with hypercapnia (HCC) Active Problems:   Hyperlipidemia   COPD exacerbation (HCC)   Acute on chronic respiratory failure (HCC)   Chronic respiratory failure with hypoxia (HCC)   Leukocytosis   Muscle weakness (generalized)  Plan: Continue treatments. I think she is approaching baseline    LOS: 11 days   Marlies Ligman L 05/24/2016, 7:22 AM

## 2016-05-24 NOTE — [place] FL2 (Signed)
[place] MEDICAID FL2 LEVEL OF CARE SCREENING TOOL     IDENTIFICATION  Patient Name: [name] Birthdate: 08/31/1955 Sex: female Admission Date (Current Location): 05/13/2016  [facility] and [place] Number:  [employer] and Address:  [facility],  [address], [place] [address]      Provider Number: [id number]  Attending Physician Name and Address:  Maxie Barb, MD  Relative Name and Phone Number:       Current Level of Care: Hospital Recommended Level of Care: Skilled Nursing Facility Prior Approval Number:    Date Approved/Denied:   PASRR Number: [id number] ([id number])  Discharge Plan: SNF    Current Diagnoses: Patient Active Problem List   Diagnosis Date Noted  . Physical deconditioning   . HSV (herpes simplex virus) anogenital infection   . Muscle weakness (generalized)   . Chronic respiratory failure with hypoxia (HCC) 01/21/2015  . Leukocytosis 01/21/2015  . Macrocytic anemia 01/21/2015  . CAP (community acquired pneumonia) 11/19/2014  . Acute on chronic respiratory failure (HCC) 11/19/2014  . Streptococcus pneumoniae pneumonia (HCC) 11/19/2014  . Thrush, oral 11/19/2014  . Respiratory failure with hypercapnia (HCC) 11/14/2014  . COPD exacerbation (HCC) 11/14/2014  . Anemia 11/14/2014  . Tachycardia 11/14/2014  . Acute respiratory failure with hypercapnia (HCC) 11/14/2014  . Chest pain 09/08/2013  . Glucose intolerance (pre-diabetes) 05/24/2012  . COPD (chronic obstructive pulmonary disease) (HCC) 12/23/2011  . 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 Hyperlipidemia 12/23/2011  . Muscle spasms of neck 12/23/2011  . Diverticulitis 02/25/2011    Orientation RESPIRATION BLADDER Height & Weight     Self, Time, Situation, Place  Other (Comment) (HFNC 6L) Continent Weight: 113 lb 8.6 oz (51.5 kg) Height:  5\' 2"  (157.5 cm)  BEHAVIORAL SYMPTOMS/MOOD NEUROLOGICAL BOWEL NUTRITION STATUS      Continent Diet (low sodium  heart healthy)  AMBULATORY STATUS COMMUNICATION OF NEEDS Skin   Limited Assist Verbally Normal                       Personal Care Assistance Level of Assistance  Bathing, Feeding, Dressing Bathing Assistance: Limited assistance Feeding assistance: Independent Dressing Assistance: Limited assistance     Functional Limitations Info  Sight, Hearing, Speech Sight Info: Adequate Hearing Info: Adequate Speech Info: Adequate    SPECIAL CARE FACTORS FREQUENCY  PT (By licensed PT)     PT Frequency: Supervision/Assistance - 24 hour;SNF;Home health PT              Contractures Contractures Info: Not present    Additional Factors Info  Code Status, Allergies Code Status Info: Full Allergies Info: Iohexol, Chantix, Ketex, Penicillins           Current Medications (05/24/2016):  This is the current hospital active medication list Current Facility-Administered Medications  Medication Dose Route Frequency Provider Last Rate Last Dose  . acetaminophen (TYLENOL) tablet 650 mg  650 mg Oral Q4H PRN Roma Kayser Schorr, NP   650 mg at 05/20/16 0350  . acyclovir (ZOVIRAX) 200 MG capsule 400 mg  400 mg Oral TID Maxie Barb, MD   400 mg at 05/24/16 1220  . albuterol (PROVENTIL) (2.5 MG/3ML) 0.083% nebulizer solution 2.5 mg  2.5 mg Nebulization Q2H PRN Houston Siren, MD   2.5 mg at 05/23/16 0412  . ALPRAZolam Prudy Feeler) tablet 0.5 mg  0.5 mg Oral TID PRN Dron Jaynie Collins, MD   0.5 mg at 05/23/16 2019  . aspirin chewable tablet 81 mg  81 mg Oral Daily Houston Siren, MD   81 mg at 05/24/16 1026  . atorvastatin (LIPITOR) tablet 40 mg  40 mg Oral QPM Houston Siren, MD   40 mg at 05/23/16 1812  . chlorpheniramine-HYDROcodone (TUSSIONEX) 10-8 MG/5ML suspension 5 mL  5 mL Oral Once Leanne Chang, NP      . clonazePAM Scarlette Calico) tablet 0.5-0.75 mg  0.5-0.75 mg Oral QHS Houston Siren, MD   0.75 mg at 05/23/16 2018  . diltiazem (CARDIZEM CD) 24 hr capsule 180 mg  180 mg Oral Daily Houston Siren, MD   180  mg at 05/24/16 1027  . diltiazem (CARDIZEM) injection 10 mg  10 mg Intravenous Q6H PRN Leroy Sea, MD      . enoxaparin (LOVENOX) injection 40 mg  40 mg Subcutaneous Q24H Houston Siren, MD   40 mg at 05/24/16 0528  . escitalopram (LEXAPRO) tablet 20 mg  20 mg Oral Daily Houston Siren, MD   20 mg at 05/24/16 1027  . fluconazole (DIFLUCAN) tablet 100 mg  100 mg Oral Q Mon-1800 Houston Siren, MD   100 mg at 05/20/16 1824  . fluticasone (FLONASE) 50 MCG/ACT nasal spray 2 spray  2 spray Each Nare Daily PRN Dron Jaynie Collins, MD      . guaiFENesin [facility]) 12 hr tablet 1,200 mg  1,200 mg Oral BID Kari Baars, MD   1,200 mg at 05/24/16 1026  . ipratropium-albuterol (DUONEB) 0.5-2.5 (3) MG/3ML nebulizer solution 3 mL  3 mL Nebulization TID Dron Jaynie Collins, MD   3 mL at 05/24/16 0749  . MEDLINE mouth rinse  15 mL Mouth Rinse BID Leroy Sea, MD   15 mL at 05/24/16 1027  . multivitamin with minerals tablet 1 tablet  1 tablet Oral Daily Houston Siren, MD   1 tablet at 05/24/16 1027  . ondansetron (ZOFRAN) injection 4 mg  4 mg Intravenous Q6H PRN Dron Jaynie Collins, MD   4 mg at 05/22/16 1136  . oxybutynin (DITROPAN) tablet 5 mg  5 mg Oral Daily Houston Siren, MD   5 mg at 05/24/16 1027  . predniSONE (DELTASONE) tablet 40 mg  40 mg Oral Q breakfast Kari Baars, MD   40 mg at 05/24/16 0751  . roflumilast (DALIRESP) tablet 500 mcg  500 mcg Oral Daily Houston Siren, MD   500 mcg at 05/24/16 1027  . sodium chloride flush (NS) 0.9 % injection 3 mL  3 mL Intravenous Q12H Houston Siren, MD   3 mL at 05/24/16 1028     Discharge Medications: Please see discharge summary for a list of discharge medications.  Relevant Imaging Results:  Relevant Lab Results:   Additional Information SSN [id number], [place], [place]

## 2016-05-25 LAB — RAPID HIV SCREEN (HIV 1/2 AB+AG)
HIV 1/2 Antibodies: NONREACTIVE
HIV-1 P24 Antigen - HIV24: NONREACTIVE

## 2016-05-25 MED ORDER — KETOROLAC TROMETHAMINE 30 MG/ML IJ SOLN
30.0000 mg | Freq: Once | INTRAMUSCULAR | Status: AC
  Administered 2016-05-25: 30 mg via INTRAVENOUS
  Filled 2016-05-25: qty 1

## 2016-05-25 NOTE — Progress Notes (Addendum)
PROGRESS NOTE    [name]  EXB:[id number] DOB: February 27, 1956 DOA: 05/13/2016 PCP: Cassell Smiles., MD   Brief Narrative: 61 y.o. female with hx of severe COPD, prior smoker, on 4L [place] home oxygen, hx of CAD, hx of SVT, HLD, presented to the ER with increase SOB. She has been on chronic steroid, and was found to have a marked leukocytosis with WBC of 18K.Evaluation included a CXR showing no infiltrate, and her bicarb was elevated to 35's. Her ABG showed 7.2/99/pOx=119 of 50%FIO2. She was placed on Bipap. She has advanced and terminal COPD  Assessment & Plan:  # Acute on chronic hypoxic and hypercapnic respiratory failure in the setting of advanced COPD, COPD exacerbation: -Patient is requiring about 6-7 L of oxygen. The requirement of oxygen increases with exertion because of desaturation. Patient uses about 4 L of oxygen at home. She also uses BiPAP.patient is chronically on oral steroid. Unknown if this is patient's new baseline. Social worker's evaluation ongoing for rehabilitation discharge with possibly high flow oxygen and BiPAP. Waiting for dyspepsia planning. - the patient completed 10 days course of oral Levaquin. IV Solu-Medrol switched to oral prednisone because of steroid psychosis and delirium. Patient was mildly confused this morning. She has waxing and waning mental status. Continue Daliresp, bronchodilators and breathing treatment.  -Pulmonary consult appreciated. I discussed with Dr. Juanetta Gosling today.  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 Patient is also on oral fluconazole weekly.  #History of coronary artery disease and SVT: The patient is on diltiazem. Continue to monitor heart rate. She has no chest pain.  #Presumed mild acute on chronic diastolic CHF: Received a dose of Lasix on 1/16. The chest x-ray with no pulmonary edema. Hold diuretics for now. Left ventricular EF of 60% in February 2017.  #Active smoking, tobacco dependence:  Patient was counseled.  #Anxiety depression: Continue Lexapro and xanax as needed.  # Generalized weakness likely physical deconditioning in the setting of medical comorbidities: TSH level acceptable. PT, OT evaluation and treatment. Social worker following for possible rehabilitation discharge. If unable to go rehabilitation patient will go home with home care services.  #Goals of care discussion: I discussed the goals of care including DO NOT RESUSCITATE, intubation with the patient and her sister at bedside. They wanted to continue current treatment for now and would like to talk to family including daughters about the CODE STATUS.   Principal Problem:   Acute respiratory failure with hypercapnia (HCC) Active Problems:   Hyperlipidemia   COPD exacerbation (HCC)   Acute on chronic respiratory failure (HCC)   Chronic respiratory failure with hypoxia (HCC)   Leukocytosis   Muscle weakness (generalized)   Physical deconditioning   HSV (herpes simplex virus) anogenital infection  DVT prophylaxis: Lovenox subcutaneous. Code Status: Full code Family Communication: I discussed with the patient's sister at bedside. I also called and discussed with patient's daughter on 05/24/16 who is a physician Dr Clent Demark (phone [phone]) Disposition Plan: Currently in  stepdown. Rehabilitation versus home with home care.  Consultants:   Pulmonologist  Procedures: None Antimicrobials: Levaquin completed 10 days course  Subjective: Patient was seen and examined at bedside. Mildly confused this morning however able to answer simple questions. Shortness of breath stable. She continues to feel weak tired fatigue. No chest pain, headache, nausea or vomiting. Sister at bedside. No major event.   Objective: Vitals:   05/25/16 0900 05/25/16 1000 05/25/16 1059 05/25/16 1200  BP:  (!) 140/117    Pulse: Marland Kitchen)  101 99  99  Resp: 16 14  18   Temp:   98.2 F (36.8 C)   TempSrc:   Oral   SpO2: 94% 96%   95%  Weight:      Height:        Intake/Output Summary (Last 24 hours) at 05/25/16 1445 Last data filed at 05/25/16 1411  Gross per 24 hour  Intake              360 ml  Output             1250 ml  Net             -890 ml   Filed Weights   05/23/16 0500 05/24/16 0500 05/25/16 0400  Weight: 52.3 kg (115 lb 4.8 oz) 51.5 kg (113 lb 8.6 oz) 53.2 kg (117 lb 4.6 oz)    Examination:  General exam: Ill-looking female lying in bed comfortable, little confused today. Respiratory system: Bilateral intermittent expiratory wheeze, basal crackles, unchanged Cardiovascular system: Regular rate rhythm, S1 and S2 normal  Gastrointestinal system: Abdomen is nondistended, soft and nontender. Normal bowel sounds heard. Central nervous system: Alert awake, following commands. Extremities: Symmetric 5 x 5 power. Skin: No rashes, lesions or ulcers Psychiatry: Judgement and insight appear impaired  Back: Open crusted herpetic lesions around the sacral region, didn't examine today.   Data Reviewed: I have personally reviewed following labs and imaging studies  CBC:  Recent Labs Lab 05/20/16 0420 05/23/16 0429 05/24/16 0922  WBC 19.7* 18.6* 28.7*  HGB 13.3 13.5 13.1  HCT 42.5 43.6 42.6  MCV 105.7* 106.1* 106.5*  PLT 381 315 254   Basic Metabolic Panel:  Recent Labs Lab 05/20/16 0420 05/22/16 0556 05/23/16 0429 05/24/16 0922  NA 138 138 139 136  K 4.3 5.2* 5.3* 5.0  CL 88* 83* 83* 82*  CO2 45* 47* 48* >50*  GLUCOSE 176* 196* 254* 321*  BUN 19 18 18 19   CREATININE 0.55 0.63 0.60 0.74  CALCIUM 8.1* 8.3* 8.1* 7.9*   GFR: Estimated Creatinine Clearance: 60.6 mL/min (by C-G formula based on SCr of 0.74 mg/dL). Liver Function Tests: No results for input(s): AST, ALT, ALKPHOS, BILITOT, PROT, ALBUMIN in the last 168 hours. No results for input(s): LIPASE, AMYLASE in the last 168 hours. No results for input(s): AMMONIA in the last 168 hours. Coagulation Profile: No results for  input(s): INR, PROTIME in the last 168 hours. Cardiac Enzymes: No results for input(s): CKTOTAL, CKMB, CKMBINDEX, TROPONINI in the last 168 hours. BNP (last 3 results) No results for input(s): PROBNP in the last 8760 hours. HbA1C: No results for input(s): HGBA1C in the last 72 hours. CBG:  Recent Labs Lab 05/24/16 2114  GLUCAP 151*   Lipid Profile: No results for input(s): CHOL, HDL, LDLCALC, TRIG, CHOLHDL, LDLDIRECT in the last 72 hours. Thyroid Function Tests: No results for input(s): TSH, T4TOTAL, FREET4, T3FREE, THYROIDAB in the last 72 hours. Anemia Panel: No results for input(s): VITAMINB12, FOLATE, FERRITIN, TIBC, IRON, RETICCTPCT in the last 72 hours. Sepsis Labs: No results for input(s): PROCALCITON, LATICACIDVEN in the last 168 hours.  No results found for this or any previous visit (from the past 240 hour(s)).       Radiology Studies: No results found.      Scheduled Meds: . acyclovir  400 mg Oral TID  . aspirin  81 mg Oral Daily  . atorvastatin  40 mg Oral QPM  . chlorpheniramine-HYDROcodone  5 mL Oral Once  . clonazePAM  0.5-0.75 mg Oral QHS  . diltiazem  180 mg Oral Daily  . enoxaparin (LOVENOX) injection  40 mg Subcutaneous Q24H  . escitalopram  20 mg Oral Daily  . fluconazole  100 mg Oral Q Mon-1800  . guaiFENesin  1,200 mg Oral BID  . ipratropium-albuterol  3 mL Nebulization TID  . mouth rinse  15 mL Mouth Rinse BID  . multivitamin with minerals  1 tablet Oral Daily  . oxybutynin  5 mg Oral Daily  . predniSONE  40 mg Oral Q breakfast  . roflumilast  500 mcg Oral Daily  . sodium chloride flush  3 mL Intravenous Q12H   Continuous Infusions:    LOS: 12 days    Uriyah Massimo Jaynie Collins, MD [facility] Pager [phone]  If 7PM-7AM, please contact night-coverage [email or web address] Password [email or web address] 05/25/2016, 2:45 PM

## 2016-05-25 NOTE — Progress Notes (Signed)
Placed on home BiPAP at original setting of 17/10 with 10 liter oxygen bled into mask. Patient oxygen had to be increased to keep Saturation at 94 percent. This would not be acceptable for home use. Machine was reset to 14/6 with oxygen set at 7 liters. Patients saturation at 94. These settings appear to be working will try through out night.

## 2016-05-25 NOTE — Progress Notes (Signed)
Subjective: She says she feels a little better. She is still getting short of breath and hypoxic with fairly minimal exertion. Family reports that the confusion is a little bit worse today but still much better than 2 or 3 days ago and seems to wax and wane to some extent. She has been able to cough up some sputum.  Objective: Vital signs in last 24 hours: Temp:  [97.8 F (36.6 C)-98.5 F (36.9 C)] 97.8 F (36.6 C) (01/20 0727) Pulse Rate:  [85-130] 89 (01/20 0727) Resp:  [15-29] 25 (01/20 0727) BP: (111-168)/(53-153) 132/79 (01/20 0400) SpO2:  [88 %-97 %] 95 % (01/20 0751) Weight:  [53.2 kg (117 lb 4.6 oz)] 53.2 kg (117 lb 4.6 oz) (01/20 0400) Weight change: 1.7 kg (3 lb 12 oz) Last BM Date: 05/21/16  Intake/Output from previous day: 01/19 0701 - 01/20 0700 In: -  Out: 3050 [Urine:3050]  PHYSICAL EXAM General appearance: alert, cooperative and moderate distress Resp: Clear with a long expiratory phase Cardio: regular rate and rhythm, S1, S2 normal, no murmur, click, rub or gallop GI: soft, non-tender; bowel sounds normal; no masses,  no organomegaly Extremities: extremities normal, atraumatic, no cyanosis or edema She has what appears to be a herpetic skin rash. She has bruises on her arms. Mucous membranes are moist  Lab Results:  Results for orders placed or performed during the hospital encounter of 05/13/16 (from the past 48 hour(s))  CBC     Status: Abnormal   Collection Time: 05/24/16  9:22 AM  Result Value Ref Range   WBC 28.7 (H) 4.0 - 10.5 K/uL   RBC 4.00 3.87 - 5.11 MIL/uL   Hemoglobin 13.1 12.0 - 15.0 g/dL   HCT 42.6 36.0 - 46.0 %   MCV 106.5 (H) 78.0 - 100.0 fL   MCH 32.8 26.0 - 34.0 pg   MCHC 30.8 30.0 - 36.0 g/dL   RDW 12.2 11.5 - 15.5 %   Platelets 254 150 - 400 K/uL  Basic metabolic panel     Status: Abnormal   Collection Time: 05/24/16  9:22 AM  Result Value Ref Range   Sodium 136 135 - 145 mmol/L   Potassium 5.0 3.5 - 5.1 mmol/L   Chloride 82 (L)  101 - 111 mmol/L   CO2 >50 (H) 22 - 32 mmol/L   Glucose, Bld 321 (H) 65 - 99 mg/dL   BUN 19 6 - 20 mg/dL   Creatinine, Ser 0.74 0.44 - 1.00 mg/dL   Calcium 7.9 (L) 8.9 - 10.3 mg/dL   GFR calc non Af Amer >60 >60 mL/min   GFR calc Af Amer >60 >60 mL/min    Comment: (NOTE) The eGFR has been calculated using the CKD EPI equation. This calculation has not been validated in all clinical situations. eGFR's persistently <60 mL/min signify possible Chronic Kidney Disease.   Glucose, capillary     Status: Abnormal   Collection Time: 05/24/16  9:14 PM  Result Value Ref Range   Glucose-Capillary 151 (H) 65 - 99 mg/dL   Comment 1 Notify RN     ABGS No results for input(s): PHART, PO2ART, TCO2, HCO3 in the last 72 hours.  Invalid input(s): PCO2 CULTURES Recent Results (from the past 240 hour(s))  Respiratory Panel by PCR     Status: None   Collection Time: 05/15/16 10:54 AM  Result Value Ref Range Status   Adenovirus NOT DETECTED NOT DETECTED Final   Coronavirus 229E NOT DETECTED NOT DETECTED Final   Coronavirus HKU1  NOT DETECTED NOT DETECTED Final   Coronavirus NL63 NOT DETECTED NOT DETECTED Final   Coronavirus OC43 NOT DETECTED NOT DETECTED Final   Metapneumovirus NOT DETECTED NOT DETECTED Final   Rhinovirus / Enterovirus NOT DETECTED NOT DETECTED Final   Influenza A NOT DETECTED NOT DETECTED Final   Influenza B NOT DETECTED NOT DETECTED Final   Parainfluenza Virus 1 NOT DETECTED NOT DETECTED Final   Parainfluenza Virus 2 NOT DETECTED NOT DETECTED Final   Parainfluenza Virus 3 NOT DETECTED NOT DETECTED Final   Parainfluenza Virus 4 NOT DETECTED NOT DETECTED Final   Respiratory Syncytial Virus NOT DETECTED NOT DETECTED Final   Bordetella pertussis NOT DETECTED NOT DETECTED Final   Chlamydophila pneumoniae NOT DETECTED NOT DETECTED Final   Mycoplasma pneumoniae NOT DETECTED NOT DETECTED Final    Comment: Performed at [facility]   Studies/Results: No results  found.  Medications:  Prior to Admission:  Prescriptions Prior to Admission  Medication Sig Dispense Refill Last Dose  . albuterol (PROAIR HFA) 108 (90 BASE) MCG/ACT inhaler Inhale 2 puffs into the lungs every 6 (six) hours as needed. For shortness of breath   05/13/2016 at Unknown time  . aspirin 81 MG tablet Take 81 mg by mouth daily.   05/13/2016 at Unknown time  . atorvastatin (LIPITOR) 40 MG tablet Take 1 tablet (40 mg total) by mouth daily. (Patient taking differently: Take 40 mg by mouth every evening. ) 90 tablet 3 05/12/2016 at Unknown time  . azithromycin (ZITHROMAX) 250 MG tablet Take 250 mg by mouth every Monday, Wednesday, and Friday.    05/10/2016  . cetirizine (ZYRTEC) 10 MG tablet Take 10 mg by mouth daily.     05/13/2016 at Unknown time  . diltiazem (CARDIZEM CD) 180 MG 24 hr capsule Take 1 capsule (180 mg total) by mouth daily. 90 capsule 3 05/13/2016 at Unknown time  . escitalopram (LEXAPRO) 20 MG tablet Take 20 mg by mouth daily.     05/13/2016 at Unknown time  . fluconazole (DIFLUCAN) 150 MG tablet Take 150 mg by mouth once a week.    05/13/2016 at Unknown time  . Fluticasone-Salmeterol (ADVAIR) 250-50 MCG/DOSE AEPB Inhale 1 puff into the lungs 2 (two) times daily.    05/13/2016 at Unknown time  . guaiFENesin (MUCINEX) 600 MG 12 hr tablet Take 1 tablet (600 mg total) by mouth 2 (two) times daily. 20 tablet 0 05/13/2016 at Unknown time  . ibuprofen (ADVIL,MOTRIN) 200 MG tablet Take 400 mg by mouth 2 (two) times daily as needed for mild pain or moderate pain.   05/13/2016 at Unknown time  . ipratropium-albuterol (DUONEB) 0.5-2.5 (3) MG/3ML SOLN Take 3 mLs by nebulization 3 (three) times daily. 360 mL 1 05/13/2016 at Unknown time  . levofloxacin (LEVAQUIN) 750 MG tablet Take 750 mg by mouth daily.   05/13/2016 at Unknown time  . montelukast (SINGULAIR) 10 MG tablet Take 1 tablet (10 mg total) by mouth at bedtime. 30 tablet 1 05/12/2016 at Unknown time  . Multiple Vitamins-Minerals (MULTIVITAMINS THER.  W/MINERALS) TABS Take 1 tablet by mouth daily.     05/13/2016 at Unknown time  . oxybutynin (DITROPAN) 5 MG tablet Take 5 mg by mouth daily.    05/13/2016 at Unknown time  . roflumilast (DALIRESP) 500 MCG TABS tablet Take 500 mcg by mouth daily.     05/13/2016 at Unknown time  . SPIRIVA RESPIMAT 2.5 MCG/ACT AERS Inhale 1 puff into the lungs daily.    05/13/2016 at Unknown time  .  triamcinolone (NASACORT ALLERGY 24HR) 55 MCG/ACT AERO nasal inhaler Place 2 sprays into the nose daily as needed (FOR ALLERGIES).   UNKNOWN  . [DISCONTINUED] ALPRAZolam (XANAX) 0.5 MG tablet Take 0.5 mg by mouth daily as needed. anxiety   05/13/2016 at Unknown time  . [DISCONTINUED] clonazePAM (KLONOPIN) 0.5 MG tablet Take 0.5-0.75 mg by mouth at bedtime.    05/12/2016 at Unknown time  . [DISCONTINUED] predniSONE (DELTASONE) 10 MG tablet Take 10 mg by mouth every morning.   HOLD  . predniSONE (DELTASONE) 10 MG tablet Take 60 mg x 3 days then 40 mg x 3 days then 30 mg x 3 days then '20mg'$  x 3 days then resume maintenance dose 10 mg daily (Patient not taking: Reported on 05/13/2016) 60 tablet 2 Not Taking at Unknown time   Scheduled: . acyclovir  400 mg Oral TID  . aspirin  81 mg Oral Daily  . atorvastatin  40 mg Oral QPM  . chlorpheniramine-HYDROcodone  5 mL Oral Once  . clonazePAM  0.5-0.75 mg Oral QHS  . diltiazem  180 mg Oral Daily  . enoxaparin (LOVENOX) injection  40 mg Subcutaneous Q24H  . escitalopram  20 mg Oral Daily  . fluconazole  100 mg Oral Q Mon-1800  . guaiFENesin  1,200 mg Oral BID  . ipratropium-albuterol  3 mL Nebulization TID  . mouth rinse  15 mL Mouth Rinse BID  . multivitamin with minerals  1 tablet Oral Daily  . oxybutynin  5 mg Oral Daily  . predniSONE  40 mg Oral Q breakfast  . roflumilast  500 mcg Oral Daily  . sodium chloride flush  3 mL Intravenous Q12H   Continuous:  NIO:[id number], albuterol, ALPRAZolam, diltiazem, fluticasone, ondansetron (ZOFRAN) IV  Assesment: She is admitted with acute on  chronic hypoxic hypercapnic respiratory failure. She does not have pneumonia. She has COPD exacerbation. She has severe COPD at baseline requiring BiPAP at home. She is markedly physically deconditioned Principal Problem:   Acute respiratory failure with hypercapnia (HCC) Active Problems:   Hyperlipidemia   COPD exacerbation (HCC)   Acute on chronic respiratory failure (HCC)   Chronic respiratory failure with hypoxia (HCC)   Leukocytosis   Muscle weakness (generalized)   Physical deconditioning   HSV (herpes simplex virus) anogenital infection    Plan: Discussed with hospitalist attending. Plan to continue treatments as they are for now eventually to skilled care facility    LOS: 12 days   Tamilyn Lupien L 05/25/2016, 8:44 AM

## 2016-05-26 LAB — HEPATITIS C ANTIBODY (REFLEX)

## 2016-05-26 LAB — HEPATITIS B SURFACE ANTIGEN: Hepatitis B Surface Ag: NEGATIVE

## 2016-05-26 LAB — HCV COMMENT:

## 2016-05-26 MED ORDER — KETOROLAC TROMETHAMINE 30 MG/ML IJ SOLN
30.0000 mg | Freq: Four times a day (QID) | INTRAMUSCULAR | Status: DC | PRN
  Administered 2016-05-26 (×3): 30 mg via INTRAVENOUS
  Filled 2016-05-26 (×3): qty 1

## 2016-05-26 NOTE — Progress Notes (Signed)
PROGRESS NOTE    [name]  UKG:[id number] DOB: [birth year] DOA: 05/13/2016 PCP: Cassell Smiles., MD   Brief Narrative: 61 y.o. female with hx of severe COPD, prior smoker, on 4L [place] home oxygen, hx of CAD, hx of SVT, HLD, presented to the ER with increase SOB. She has been on chronic steroid, and was found to have a marked leukocytosis with WBC of 18K.Evaluation included a CXR showing no infiltrate, and her bicarb was elevated to 35's. Her ABG showed 7.2/99/pOx=119 of 50%FIO2. She was placed on Bipap. She has advanced and terminal COPD. Now waiting for safe discharge planning. Social worker eval is ongoing.  Assessment & Plan:  # Acute on chronic hypoxic and hypercapnic respiratory failure in the setting of advanced COPD, COPD exacerbation: -Patient is requiring about 6-7 L of oxygen. The requirement of oxygen increases with exertion because of desaturation. Patient uses about 4 L of oxygen at home. She also uses BiPAP.patient is chronically on oral steroid. Unknown if this is patient's new baseline. Social worker's evaluation ongoing for rehabilitation discharge with possibly high flow oxygen and BiPAP. Waiting for safe discharge planning. - the patient completed 10 days course of oral Levaquin.  -IV Solu-Medrol switched to oral prednisone because of steroid psychosis and delirium.  -Mentally status is good today. No confusion. sister at bedside. -Continue Daliresp, bronchodilators and breathing treatment.  -Pulmonary consult appreciated.  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 Patient is also on oral fluconazole weekly.  #History of coronary artery disease and SVT: The patient is on diltiazem. Continue to monitor heart rate. She has no chest pain.  #Presumed mild acute on chronic diastolic CHF: Received a dose of Lasix on 1/16. The chest x-ray with no pulmonary edema. Hold diuretics for now. Left ventricular EF of 60% in February  2017.  #Active smoking, tobacco dependence: Patient was counseled.  #Anxiety depression: Continue Lexapro and xanax as needed.  # Generalized weakness likely physical deconditioning in the setting of medical comorbidities: TSH level acceptable. PT, OT evaluation and treatment. Social worker following for possible rehabilitation discharge. If unable to go rehabilitation patient will go home with home care services.  #Goals of care discussion: I discussed the goals of care including DO NOT RESUSCITATE, intubation with the patient and her sister at bedside on 05/25/2016. They wanted to continue current treatment for now and would like to talk to family including daughters about the CODE STATUS.   Principal Problem:   Acute respiratory failure with hypercapnia (HCC) Active Problems:   Hyperlipidemia   COPD exacerbation (HCC)   Acute on chronic respiratory failure (HCC)   Chronic respiratory failure with hypoxia (HCC)   Leukocytosis   Muscle weakness (generalized)   Physical deconditioning   HSV (herpes simplex virus) anogenital infection  DVT prophylaxis: Lovenox subcutaneous. Code Status: Full code Family Communication: I discussed with the patient's sister at bedside. I also called and discussed with patient's daughter on 05/24/16 who is a physician Dr Clent Demark (phone [phone]) Disposition Plan: Currently in  stepdown. Rehabilitation versus home with home care.  Consultants:   Pulmonologist  Procedures: None Antimicrobials: Levaquin completed 10 days course  Subjective: Patient was seen and examined at bedside. No new event. No confused today. Reported stable shortness of breath and weakness. No chest pain, nausea or vomiting. Sister at bedside.   Objective: Vitals:   05/25/16 0900 05/25/16 1000 05/25/16 1059 05/25/16 1200  BP:  (!) 140/117    Pulse: (!) 101 99  99  Resp: 16 14  18   Temp:   98.2 F (36.8 C)   TempSrc:   Oral   SpO2: 94% 96%  95%  Weight:       Height:        Intake/Output Summary (Last 24 hours) at 05/25/16 1445 Last data filed at 05/25/16 1411  Gross per 24 hour  Intake              360 ml  Output             1250 ml  Net             -890 ml   Filed Weights   05/23/16 0500 05/24/16 0500 05/25/16 0400  Weight: 52.3 kg (115 lb 4.8 oz) 51.5 kg (113 lb 8.6 oz) 53.2 kg (117 lb 4.6 oz)    Examination:  General exam: Ill-looking female sitting on bed, not in distress, more alert and talkative today. Respiratory system: Mild bilateral diffuse expiratory wheeze, basal crackles, unchanged Cardiovascular system: Regular rate rhythm, S1 and S2 normal  Gastrointestinal system: Abdomen is nondistended, soft and nontender. Normal bowel sounds heard. Central nervous system: Alert awake, following commands. Extremities: Symmetric 5 x 5 power. Skin: No rashes, lesions or ulcers Psychiatry: Judgement and insight appear normal today.  Back: Open crusted herpetic lesions around the sacral region, didn't examine today.   Data Reviewed: I have personally reviewed following labs and imaging studies  CBC:  Recent Labs Lab 05/20/16 0420 05/23/16 0429 05/24/16 0922  WBC 19.7* 18.6* 28.7*  HGB 13.3 13.5 13.1  HCT 42.5 43.6 42.6  MCV 105.7* 106.1* 106.5*  PLT 381 315 254   Basic Metabolic Panel:  Recent Labs Lab 05/20/16 0420 05/22/16 0556 05/23/16 0429 05/24/16 0922  NA 138 138 139 136  K 4.3 5.2* 5.3* 5.0  CL 88* 83* 83* 82*  CO2 45* 47* 48* >50*  GLUCOSE 176* 196* 254* 321*  BUN 19 18 18 19   CREATININE 0.55 0.63 0.60 0.74  CALCIUM 8.1* 8.3* 8.1* 7.9*   GFR: Estimated Creatinine Clearance: 60.6 mL/min (by C-G formula based on SCr of 0.74 mg/dL). Liver Function Tests: No results for input(s): AST, ALT, ALKPHOS, BILITOT, PROT, ALBUMIN in the last 168 hours. No results for input(s): LIPASE, AMYLASE in the last 168 hours. No results for input(s): AMMONIA in the last 168 hours. Coagulation Profile: No results for  input(s): INR, PROTIME in the last 168 hours. Cardiac Enzymes: No results for input(s): CKTOTAL, CKMB, CKMBINDEX, TROPONINI in the last 168 hours. BNP (last 3 results) No results for input(s): PROBNP in the last 8760 hours. HbA1C: No results for input(s): HGBA1C in the last 72 hours. CBG:  Recent Labs Lab 05/24/16 2114  GLUCAP 151*   Lipid Profile: No results for input(s): CHOL, HDL, LDLCALC, TRIG, CHOLHDL, LDLDIRECT in the last 72 hours. Thyroid Function Tests: No results for input(s): TSH, T4TOTAL, FREET4, T3FREE, THYROIDAB in the last 72 hours. Anemia Panel: No results for input(s): VITAMINB12, FOLATE, FERRITIN, TIBC, IRON, RETICCTPCT in the last 72 hours. Sepsis Labs: No results for input(s): PROCALCITON, LATICACIDVEN in the last 168 hours.  No results found for this or any previous visit (from the past 240 hour(s)).       Radiology Studies: No results found.      Scheduled Meds: . acyclovir  400 mg Oral TID  . aspirin  81 mg Oral Daily  . atorvastatin  40 mg Oral QPM  . chlorpheniramine-HYDROcodone  5 mL Oral Once  .  clonazePAM  0.5-0.75 mg Oral QHS  . diltiazem  180 mg Oral Daily  . enoxaparin (LOVENOX) injection  40 mg Subcutaneous Q24H  . escitalopram  20 mg Oral Daily  . fluconazole  100 mg Oral Q Mon-1800  . guaiFENesin  1,200 mg Oral BID  . ipratropium-albuterol  3 mL Nebulization TID  . mouth rinse  15 mL Mouth Rinse BID  . multivitamin with minerals  1 tablet Oral Daily  . oxybutynin  5 mg Oral Daily  . predniSONE  40 mg Oral Q breakfast  . roflumilast  500 mcg Oral Daily  . sodium chloride flush  3 mL Intravenous Q12H   Continuous Infusions:    LOS: 12 days    [name] Jaynie Collins, MD [facility] Pager [phone]  If 7PM-7AM, please contact night-coverage [email or web address] Password [email or web address] 05/25/2016, 2:45 PM

## 2016-05-26 NOTE — Progress Notes (Signed)
Subjective: She says she feels better. She was able to sit up in a chair for about 2 hours without any hypoxemia. It made her somewhat short of breath but not as bad as it has been. She's not as confused as previously. No other new complaints. No nausea vomiting diarrhea chest pain.  Objective: Vital signs in last 24 hours: Temp:  [97.5 F (36.4 C)-98 F (36.7 C)] 98 F (36.7 C) (01/21 1112) Pulse Rate:  [84-121] 101 (01/21 1112) Resp:  [14-22] 19 (01/21 1112) BP: (101-144)/(69-71) 144/71 (01/21 0800) SpO2:  [91 %-98 %] 98 % (01/21 1112) Weight:  [51.9 kg (114 lb 6.7 oz)] 51.9 kg (114 lb 6.7 oz) (01/21 0500) Weight change: -1.3 kg (-2 lb 13.9 oz) Last BM Date: 05/25/16  Intake/Output from previous day: 01/20 0701 - 01/21 0700 In: 360 [P.O.:360] Out: 2000 [Urine:2000]  PHYSICAL EXAM General appearance: alert, cooperative and mild distress Resp: Diminished breath sounds and prolonged expiratory phase Cardio: regular rate and rhythm, S1, S2 normal, no murmur, click, rub or gallop GI: soft, non-tender; bowel sounds normal; no masses,  no organomegaly Extremities: extremities normal, atraumatic, no cyanosis or edema Pupils react. Mucous membranes are moist  Lab Results:  Results for orders placed or performed during the hospital encounter of 05/13/16 (from the past 48 hour(s))  Glucose, capillary     Status: Abnormal   Collection Time: 05/24/16  9:14 PM  Result Value Ref Range   Glucose-Capillary 151 (H) 65 - 99 mg/dL   Comment 1 Notify RN   Rapid HIV screen (HIV 1/2 Ab+Ag)     Status: None   Collection Time: 05/25/16  8:34 AM  Result Value Ref Range   HIV-1 P24 Antigen - HIV24 NON REACTIVE NON REACTIVE   HIV 1/2 Antibodies NON REACTIVE NON REACTIVE   Interpretation (HIV Ag Ab)      A non reactive test result means that HIV 1 or HIV 2 antibodies and HIV 1 p24 antigen were not detected in the specimen.  Hepatitis B surface antigen     Status: None   Collection Time: 05/25/16   8:34 AM  Result Value Ref Range   Hepatitis B Surface Ag Negative Negative    Comment: (NOTE) Performed At: [facility] [place], [place] [id number] Lindon Romp MD [id number]   Hepatitis c antibody (reflex)     Status: None   Collection Time: 05/25/16  8:34 AM  Result Value Ref Range   HCV Ab <0.1 0.0 - 0.9 s/co ratio    Comment: (NOTE) Performed At: [facility] [place], [place] [id number] Lindon Romp MD [id number]   HCV Comment:     Status: None   Collection Time: 05/25/16  8:34 AM  Result Value Ref Range   Comment: Comment     Comment: (NOTE) Non reactive HCV antibody screen is consistent with no HCV infection, unless recent infection is suspected or other evidence exists to indicate HCV infection. Performed At: [facility] [place], [place] [id number] Lindon Romp MD [id number]     ABGS No results for input(s): PHART, PO2ART, TCO2, HCO3 in the last 72 hours.  Invalid input(s): PCO2 CULTURES No results found for this or any previous visit (from the past 240 hour(s)). Studies/Results: No results found.  Medications:  Prior to Admission:  Prescriptions Prior to Admission  Medication Sig Dispense Refill Last Dose  . albuterol (PROAIR HFA) 108 (90 BASE) MCG/ACT inhaler Inhale 2 puffs into  the lungs every 6 (six) hours as needed. For shortness of breath   05/13/2016 at Unknown time  . aspirin 81 MG tablet Take 81 mg by mouth daily.   05/13/2016 at Unknown time  . atorvastatin (LIPITOR) 40 MG tablet Take 1 tablet (40 mg total) by mouth daily. (Patient taking differently: Take 40 mg by mouth every evening. ) 90 tablet 3 05/12/2016 at Unknown time  . azithromycin (ZITHROMAX) 250 MG tablet Take 250 mg by mouth every Monday, Wednesday, and Friday.    05/10/2016  . cetirizine (ZYRTEC) 10 MG tablet Take 10 mg by mouth daily.     05/13/2016 at Unknown time  . diltiazem (CARDIZEM CD) 180 MG 24 hr  capsule Take 1 capsule (180 mg total) by mouth daily. 90 capsule 3 05/13/2016 at Unknown time  . escitalopram (LEXAPRO) 20 MG tablet Take 20 mg by mouth daily.     05/13/2016 at Unknown time  . fluconazole (DIFLUCAN) 150 MG tablet Take 150 mg by mouth once a week.    05/13/2016 at Unknown time  . Fluticasone-Salmeterol (ADVAIR) 250-50 MCG/DOSE AEPB Inhale 1 puff into the lungs 2 (two) times daily.    05/13/2016 at Unknown time  . guaiFENesin (MUCINEX) 600 MG 12 hr tablet Take 1 tablet (600 mg total) by mouth 2 (two) times daily. 20 tablet 0 05/13/2016 at Unknown time  . ibuprofen (ADVIL,MOTRIN) 200 MG tablet Take 400 mg by mouth 2 (two) times daily as needed for mild pain or moderate pain.   05/13/2016 at Unknown time  . ipratropium-albuterol (DUONEB) 0.5-2.5 (3) MG/3ML SOLN Take 3 mLs by nebulization 3 (three) times daily. 360 mL 1 05/13/2016 at Unknown time  . levofloxacin (LEVAQUIN) 750 MG tablet Take 750 mg by mouth daily.   05/13/2016 at Unknown time  . montelukast (SINGULAIR) 10 MG tablet Take 1 tablet (10 mg total) by mouth at bedtime. 30 tablet 1 05/12/2016 at Unknown time  . Multiple Vitamins-Minerals (MULTIVITAMINS THER. W/MINERALS) TABS Take 1 tablet by mouth daily.     05/13/2016 at Unknown time  . oxybutynin (DITROPAN) 5 MG tablet Take 5 mg by mouth daily.    05/13/2016 at Unknown time  . roflumilast (DALIRESP) 500 MCG TABS tablet Take 500 mcg by mouth daily.     05/13/2016 at Unknown time  . SPIRIVA RESPIMAT 2.5 MCG/ACT AERS Inhale 1 puff into the lungs daily.    05/13/2016 at Unknown time  . triamcinolone (NASACORT ALLERGY 24HR) 55 MCG/ACT AERO nasal inhaler Place 2 sprays into the nose daily as needed (FOR ALLERGIES).   UNKNOWN  . [DISCONTINUED] ALPRAZolam (XANAX) 0.5 MG tablet Take 0.5 mg by mouth daily as needed. anxiety   05/13/2016 at Unknown time  . [DISCONTINUED] clonazePAM (KLONOPIN) 0.5 MG tablet Take 0.5-0.75 mg by mouth at bedtime.    05/12/2016 at Unknown time  . [DISCONTINUED] predniSONE (DELTASONE) 10  MG tablet Take 10 mg by mouth every morning.   HOLD  . predniSONE (DELTASONE) 10 MG tablet Take 60 mg x 3 days then 40 mg x 3 days then 30 mg x 3 days then 20mg  x 3 days then resume maintenance dose 10 mg daily (Patient not taking: Reported on 05/13/2016) 60 tablet 2 Not Taking at Unknown time   Scheduled: . acyclovir  400 mg Oral TID  . aspirin  81 mg Oral Daily  . atorvastatin  40 mg Oral QPM  . chlorpheniramine-HYDROcodone  5 mL Oral Once  . clonazePAM  0.5-0.75 mg Oral QHS  .  diltiazem  180 mg Oral Daily  . enoxaparin (LOVENOX) injection  40 mg Subcutaneous Q24H  . escitalopram  20 mg Oral Daily  . fluconazole  100 mg Oral Q Mon-1800  . guaiFENesin  1,200 mg Oral BID  . ipratropium-albuterol  3 mL Nebulization TID  . mouth rinse  15 mL Mouth Rinse BID  . multivitamin with minerals  1 tablet Oral Daily  . oxybutynin  5 mg Oral Daily  . predniSONE  40 mg Oral Q breakfast  . roflumilast  500 mcg Oral Daily  . sodium chloride flush  3 mL Intravenous Q12H   Continuous:  [id number], albuterol, ALPRAZolam, diltiazem, fluticasone, ketorolac, ondansetron (ZOFRAN) IV  Assesment: She was admitted with acute hypoxic/hypercapnic respiratory failure. She is slowly improving. At baseline she has severe COPD on chronic prednisone chronic oxygen at 4 L and BiPAP at home. She was able to use her home BiPAP unit last night after having adjustments made by respiratory. She's been able to sit up more. Plans are for her to go to a skilled care facility at discharge. Principal Problem:   Acute respiratory failure with hypercapnia (HCC) Active Problems:   Hyperlipidemia   COPD exacerbation (HCC)   Acute on chronic respiratory failure (HCC)   Chronic respiratory failure with hypoxia (HCC)   Leukocytosis   Muscle weakness (generalized)   Physical deconditioning   HSV (herpes simplex virus) anogenital infection    Plan: I think it's reasonable to try to get her to telemetry bed. That should  help [place] to decide if she's going to be able to manage at skilled care facility level at this time.    LOS: 13 days   Pietro Bonura L 05/26/2016, 12:00 PM

## 2016-05-27 LAB — CBC
HEMATOCRIT: 35.5 % — AB (ref 36.0–46.0)
HEMOGLOBIN: 11.5 g/dL — AB (ref 12.0–15.0)
MCH: 33.1 pg (ref 26.0–34.0)
MCHC: 32.4 g/dL (ref 30.0–36.0)
MCV: 102.3 fL — ABNORMAL HIGH (ref 78.0–100.0)
Platelets: 272 10*3/uL (ref 150–400)
RBC: 3.47 MIL/uL — AB (ref 3.87–5.11)
RDW: 12.5 % (ref 11.5–15.5)
WBC: 17.9 10*3/uL — ABNORMAL HIGH (ref 4.0–10.5)

## 2016-05-27 LAB — BASIC METABOLIC PANEL
Anion gap: 4 — ABNORMAL LOW (ref 5–15)
BUN: 12 mg/dL (ref 6–20)
CHLORIDE: 88 mmol/L — AB (ref 101–111)
CO2: 47 mmol/L — AB (ref 22–32)
Calcium: 7.8 mg/dL — ABNORMAL LOW (ref 8.9–10.3)
Creatinine, Ser: 0.53 mg/dL (ref 0.44–1.00)
GFR calc non Af Amer: >60 mL/min (ref >60)
Glucose, Bld: 103 mg/dL — ABNORMAL HIGH (ref 65–99)
POTASSIUM: 3.3 mmol/L — AB (ref 3.5–5.1)
SODIUM: 139 mmol/L (ref 135–145)

## 2016-05-27 LAB — MAGNESIUM: MAGNESIUM: 1.9 mg/dL (ref 1.7–2.4)

## 2016-05-27 MED ORDER — POTASSIUM CHLORIDE CRYS ER 20 MEQ PO TBCR
40.0000 meq | EXTENDED_RELEASE_TABLET | Freq: Once | ORAL | Status: AC
  Administered 2016-05-27: 40 meq via ORAL
  Filled 2016-05-27: qty 2

## 2016-05-27 MED ORDER — SALINE SPRAY 0.65 % NA SOLN
1.0000 | NASAL | Status: DC | PRN
  Administered 2016-05-27: 1 via NASAL
  Filled 2016-05-27: qty 44

## 2016-05-27 MED ORDER — PREDNISONE 10 MG PO TABS: 10.0000 mg | ORAL_TABLET | ORAL | Status: DC

## 2016-05-27 MED ORDER — ACYCLOVIR 200 MG PO CAPS: 400.0000 mg | ORAL_CAPSULE | Freq: Three times a day (TID) | ORAL | 0 refills | Status: AC

## 2016-05-27 MED ORDER — CLONAZEPAM 0.5 MG PO TABS: 0.5000 mg | ORAL_TABLET | Freq: Every evening | ORAL | 0 refills | Status: DC | PRN

## 2016-05-27 MED ORDER — IBUPROFEN 400 MG PO TABS: 400.0000 mg | ORAL_TABLET | Freq: Four times a day (QID) | ORAL | Status: DC | PRN

## 2016-05-27 NOTE — Progress Notes (Signed)
Patient A&O x4 earlier in shift. Patient has been sleeping. When patient woke up, she asked her sister if she was still alive. Patient confused. Sister in room with patient at this time.

## 2016-05-27 NOTE — Clinical Social Work Note (Signed)
[facility] unable to take pt now due to HFNC. Admissions did not communicate this to nursing at facility. CSW spoke with supervisor and Market researcher. PNC and [facility] reviewing information again. Pt and MD updated.  [name], [name]

## 2016-05-27 NOTE — Clinical Social Work Placement (Signed)
CLINICAL SOCIAL WORK PLACEMENT  NOTE  Date:  05/27/2016  Patient Details  Name: [name] MRN: [id number] Date of Birth: 11/05/1955  Clinical Social Work is seeking post-discharge placement for this patient at the Skilled  Nursing Facility level of care (*CSW will initial, date and re-position this form in  chart as items are completed):  Yes   Patient/family provided with [place] Clinical Social Work Department's list of facilities offering this level of care within the geographic area requested by the patient (or if unable, by the patient's family).  Yes   Patient/family informed of their freedom to choose among providers that offer the needed level of care, that participate in Medicare, Medicaid or managed care program needed by the patient, have an available bed and are willing to accept the patient.  Yes   Patient/family informed of [facility]'s ownership interest in [facility] and [facility], as well as of the fact that they are under no obligation to receive care at these facilities.  PASRR submitted to EDS on 05/21/16     PASRR number received on 05/21/16     Existing PASRR number confirmed on       FL2 transmitted to all facilities in geographic area requested by pt/family on 05/21/16     FL2 transmitted to all facilities within larger geographic area on       Patient informed that his/her managed care company has contracts with or will negotiate with certain facilities, including the following:        Yes   Patient/family informed of bed offers received.  Patient chooses bed at [facility]     Physician recommends and patient chooses bed at      Patient to be transferred to [facility] on 05/27/16.  Patient to be transferred to facility by [facility]     Patient family notified on 05/27/16 of transfer.  Name of family member notified:  Daughters, Judeth Cornfield and [employer]. Sister, T[place]     PHYSICIAN        Additional Comment: CSW signing off.  CSW notified facility [facility]) and discussed patient's HFNC and Bi-Pap settings/needs with facility. Facility can manage oxygen needs and Bi-Pap needs. Patient advised to bring her own Bi-Pap machine. Patient agreeable.    _______________________________________________ Annice Needy, LCSW 05/27/2016, 12:48 PM

## 2016-05-27 NOTE — Progress Notes (Signed)
Subjective: She says she feels okay. She did well with transition to telemetry and out of stepdown. She is confused again this morning I think it may be from change in environment. No other new complaints. No fever or chills. No nausea or vomiting chest pain hemoptysis.  Objective: Vital signs in last 24 hours: Temp:  [97.3 F (36.3 C)-98 F (36.7 C)] 97.4 F (36.3 C) (01/22 0630) Pulse Rate:  [81-124] 94 (01/22 0630) Resp:  [14-26] 20 (01/22 0630) BP: (109-144)/(55-87) 112/57 (01/22 0630) SpO2:  [92 %-98 %] 96 % (01/22 0745) Weight change:  Last BM Date: 05/26/16  Intake/Output from previous day: 01/21 0701 - 01/22 0700 In: 363 [P.O.:360; I.V.:3] Out: 700 [Urine:700]  PHYSICAL EXAM General appearance: alert, cooperative, mild distress and Mildly confused Resp: Decreased breath sounds. Cardio: regular rate and rhythm, S1, S2 normal, no murmur, click, rub or gallop GI: soft, non-tender; bowel sounds normal; no masses,  no organomegaly Extremities: extremities normal, atraumatic, no cyanosis or edema She has ecchymotic areas on the skin presumably from prednisone. Mucous membranes are moist. Pupils react.  Lab Results:  Results for orders placed or performed during the hospital encounter of 05/13/16 (from the past 48 hour(s))  Rapid HIV screen (HIV 1/2 Ab+Ag)     Status: None   Collection Time: 05/25/16  8:34 AM  Result Value Ref Range   HIV-1 P24 Antigen - HIV24 NON REACTIVE NON REACTIVE   HIV 1/2 Antibodies NON REACTIVE NON REACTIVE   Interpretation (HIV Ag Ab)      A non reactive test result means that HIV 1 or HIV 2 antibodies and HIV 1 p24 antigen were not detected in the specimen.  Hepatitis B surface antigen     Status: None   Collection Time: 05/25/16  8:34 AM  Result Value Ref Range   Hepatitis B Surface Ag Negative Negative    Comment: (NOTE) Performed At: [facility] [address] [place], [place] [id number] Mila Homer MD [id number]    Hepatitis c antibody (reflex)     Status: None   Collection Time: 05/25/16  8:34 AM  Result Value Ref Range   HCV Ab <0.1 0.0 - 0.9 s/co ratio    Comment: (NOTE) Performed At: [facility] [address] [place], [place] [id number] Mila Homer MD UZ:[id number]   HCV Comment:     Status: None   Collection Time: 05/25/16  8:34 AM  Result Value Ref Range   Comment: Comment     Comment: (NOTE) Non reactive HCV antibody screen is consistent with no HCV infection, unless recent infection is suspected or other evidence exists to indicate HCV infection. Performed At: [facility] [address] [place], [place] [id number] Mila Homer MD [id number]:[id number]   CBC     Status: Abnormal   Collection Time: 05/27/16  5:24 AM  Result Value Ref Range   WBC 17.9 (H) 4.0 - 10.5 K/uL   RBC 3.47 (L) 3.87 - 5.11 MIL/uL   Hemoglobin 11.5 (L) 12.0 - 15.0 g/dL   HCT 27.6 (L) 47.9 - 38.1 %   MCV 102.3 (H) 78.0 - 100.0 fL   MCH 33.1 26.0 - 34.0 pg   MCHC 32.4 30.0 - 36.0 g/dL   RDW 02.6 93.7 - 09.3 %   Platelets 272 150 - 400 K/uL  Basic metabolic panel     Status: Abnormal   Collection Time: 05/27/16  5:24 AM  Result Value Ref Range   Sodium 139 135 - 145  mmol/L   Potassium 3.3 (L) 3.5 - 5.1 mmol/L   Chloride 88 (L) 101 - 111 mmol/L   CO2 47 (H) 22 - 32 mmol/L   Glucose, Bld 103 (H) 65 - 99 mg/dL   BUN 12 6 - 20 mg/dL   Creatinine, Ser 9.06 0.44 - 1.00 mg/dL   Calcium 7.8 (L) 8.9 - 10.3 mg/dL   GFR calc non Af Amer >60 >60 mL/min   GFR calc Af Amer >60 >60 mL/min    Comment: (NOTE) The eGFR has been calculated using the CKD EPI equation. This calculation has not been validated in all clinical situations. eGFR's persistently <60 mL/min signify possible Chronic Kidney Disease.    Anion gap 4 (L) 5 - 15  Magnesium     Status: None   Collection Time: 05/27/16  5:24 AM  Result Value Ref Range   Magnesium 1.9 1.7 - 2.4 mg/dL    ABGS No results for input(s): PHART,  PO2ART, TCO2, HCO3 in the last 72 hours.  Invalid input(s): PCO2 CULTURES No results found for this or any previous visit (from the past 240 hour(s)). Studies/Results: No results found.  Medications:  Prior to Admission:  Prescriptions Prior to Admission  Medication Sig Dispense Refill Last Dose  . albuterol (PROAIR HFA) 108 (90 BASE) MCG/ACT inhaler Inhale 2 puffs into the lungs every 6 (six) hours as needed. For shortness of breath   05/13/2016 at Unknown time  . aspirin 81 MG tablet Take 81 mg by mouth daily.   05/13/2016 at Unknown time  . atorvastatin (LIPITOR) 40 MG tablet Take 1 tablet (40 mg total) by mouth daily. (Patient taking differently: Take 40 mg by mouth every evening. ) 90 tablet 3 05/12/2016 at Unknown time  . azithromycin (ZITHROMAX) 250 MG tablet Take 250 mg by mouth every Monday, Wednesday, and Friday.    05/10/2016  . cetirizine (ZYRTEC) 10 MG tablet Take 10 mg by mouth daily.     05/13/2016 at Unknown time  . diltiazem (CARDIZEM CD) 180 MG 24 hr capsule Take 1 capsule (180 mg total) by mouth daily. 90 capsule 3 05/13/2016 at Unknown time  . escitalopram (LEXAPRO) 20 MG tablet Take 20 mg by mouth daily.     05/13/2016 at Unknown time  . fluconazole (DIFLUCAN) 150 MG tablet Take 150 mg by mouth once a week.    05/13/2016 at Unknown time  . Fluticasone-Salmeterol (ADVAIR) 250-50 MCG/DOSE AEPB Inhale 1 puff into the lungs 2 (two) times daily.    05/13/2016 at Unknown time  . guaiFENesin (MUCINEX) 600 MG 12 hr tablet Take 1 tablet (600 mg total) by mouth 2 (two) times daily. 20 tablet 0 05/13/2016 at Unknown time  . ibuprofen (ADVIL,MOTRIN) 200 MG tablet Take 400 mg by mouth 2 (two) times daily as needed for mild pain or moderate pain.   05/13/2016 at Unknown time  . ipratropium-albuterol (DUONEB) 0.5-2.5 (3) MG/3ML SOLN Take 3 mLs by nebulization 3 (three) times daily. 360 mL 1 05/13/2016 at Unknown time  . levofloxacin (LEVAQUIN) 750 MG tablet Take 750 mg by mouth daily.   05/13/2016 at Unknown  time  . montelukast (SINGULAIR) 10 MG tablet Take 1 tablet (10 mg total) by mouth at bedtime. 30 tablet 1 05/12/2016 at Unknown time  . Multiple Vitamins-Minerals (MULTIVITAMINS THER. W/MINERALS) TABS Take 1 tablet by mouth daily.     05/13/2016 at Unknown time  . oxybutynin (DITROPAN) 5 MG tablet Take 5 mg by mouth daily.    05/13/2016 at  Unknown time  . roflumilast (DALIRESP) 500 MCG TABS tablet Take 500 mcg by mouth daily.     05/13/2016 at Unknown time  . SPIRIVA RESPIMAT 2.5 MCG/ACT AERS Inhale 1 puff into the lungs daily.    05/13/2016 at Unknown time  . triamcinolone (NASACORT ALLERGY 24HR) 55 MCG/ACT AERO nasal inhaler Place 2 sprays into the nose daily as needed (FOR ALLERGIES).   UNKNOWN  . [DISCONTINUED] ALPRAZolam (XANAX) 0.5 MG tablet Take 0.5 mg by mouth daily as needed. anxiety   05/13/2016 at Unknown time  . [DISCONTINUED] clonazePAM (KLONOPIN) 0.5 MG tablet Take 0.5-0.75 mg by mouth at bedtime.    05/12/2016 at Unknown time  . [DISCONTINUED] predniSONE (DELTASONE) 10 MG tablet Take 10 mg by mouth every morning.   HOLD  . predniSONE (DELTASONE) 10 MG tablet Take 60 mg x 3 days then 40 mg x 3 days then 30 mg x 3 days then '20mg'$  x 3 days then resume maintenance dose 10 mg daily (Patient not taking: Reported on 05/13/2016) 60 tablet 2 Not Taking at Unknown time   Scheduled: . acyclovir  400 mg Oral TID  . aspirin  81 mg Oral Daily  . atorvastatin  40 mg Oral QPM  . chlorpheniramine-HYDROcodone  5 mL Oral Once  . clonazePAM  0.5-0.75 mg Oral QHS  . diltiazem  180 mg Oral Daily  . enoxaparin (LOVENOX) injection  40 mg Subcutaneous Q24H  . escitalopram  20 mg Oral Daily  . fluconazole  100 mg Oral Q Mon-1800  . guaiFENesin  1,200 mg Oral BID  . ipratropium-albuterol  3 mL Nebulization TID  . mouth rinse  15 mL Mouth Rinse BID  . multivitamin with minerals  1 tablet Oral Daily  . oxybutynin  5 mg Oral Daily  . predniSONE  40 mg Oral Q breakfast  . roflumilast  500 mcg Oral Daily  . sodium  chloride flush  3 mL Intravenous Q12H   Continuous:  HGD:[id number], albuterol, ALPRAZolam, diltiazem, fluticasone, ketorolac, ondansetron (ZOFRAN) IV  Assesment: She was admitted with acute on chronic hypoxic/hypercapnic respiratory failure. She has had slow improvement. She still requiring more oxygen than previously. Her BiPAP is working now. Principal Problem:   Acute respiratory failure with hypercapnia (HCC) Active Problems:   Hyperlipidemia   COPD exacerbation (HCC)   Acute on chronic respiratory failure (HCC)   Chronic respiratory failure with hypoxia (HCC)   Leukocytosis   Muscle weakness (generalized)   Physical deconditioning   HSV (herpes simplex virus) anogenital infection    Plan: Continue treatments I do think she may have set a new baseline.    LOS: 14 days   Astin Sayre L 05/27/2016, 7:51 AM

## 2016-05-27 NOTE — Discharge Summary (Signed)
Physician Discharge Summary  [name] MVH:[id number] DOB: 1955/10/29 DOA: 05/13/2016  PCP: Cassell Smiles., MD  Admit date: 05/13/2016 Discharge date: 05/27/2016  Admitted From:Home Disposition:rehab ([facility])  Recommendations for Outpatient Follow-up:  1. Follow up with PCP in 1-2 weeks 2. Please obtain BMP/CBC in one week 3. Follow-up with pulmonologist.    Home Health:SNF Equipment/Devices:bpap and high flow oxygen Discharge Condition:stable CODE STATUS:full code Diet recommendation: Heart healthy.  Brief/Interim Summary:61 y.o. female with hx of severe COPD, prior smoker, on 4L [place] home oxygen, hx of CAD, hx of SVT, HLD, presented to the ER with increase SOB. She has been on chronic steroid, and was found to have a marked leukocytosis with WBC of 18K.Evaluation included a CXR showing no infiltrate, and her bicarb was elevated to 35's. Her ABG showed 7.2/99/pOx=119 of 50%FIO2. She was placed on Bipap. She has advanced and terminal COPD.  # Acute on chronic hypoxic and hypercapnic respiratory failure in the setting of advanced COPD, COPD exacerbation: -Patient is requiring about 6-7 L of oxygen. The requirement of oxygen increases with exertion because of desaturation. Patient was using about 4 L of oxygen at home. She also uses BiPAP.patient is chronically on oral steroid.  -Patient completed 10 days of oral Levaquin. Treated with IV Solu-Medrol and switched to oral prednisone. Plan to taper prednisone slowly with maintenance dose of 10 mg every day. Patient was evaluated by pulmonologist. This higher requirement of oxygen likely patient's new baseline. Discussed with the social worker who reported that the rehabilitation/SNF is accepting patient with BiPAP and high flow oxygen. I discussed with the patient's 2 daughters both of them are physician in detail. I informed them about the medications and plan of care including advanced COPD. -Patient developed possible steroid  psychosis versus delirium in the hospital. Mental status improved today. -Continue Daliresp, bronchodilators and breathing treatment.  -Pulmonary consult appreciated.  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  #History of coronary artery disease and SVT: The patient is on diltiazem. Continue to monitor heart rate. She has no chest pain.  #Presumed mild acute on chronic diastolic CHF on admission which is ruled out: Received a dose of Lasix on 1/16. The chest x-ray with no pulmonary edema.   Left ventricular EF of 60% in February 2017.  #Active smoking, tobacco dependence: Patient was counseled.  #Anxiety depression: Continue Lexapro and xanax as needed.  # Generalized weakness likely physical deconditioning in the setting of medical comorbidities: TSH level acceptable. PT, OT evaluation and treatment. Social worker following for possible rehabilitation discharge.  -Patient is being discharged to SNF for further rehabilitation and care.  Patient is on high flow oxygen and required BiPAP in the night. This is probably her new baseline.  No change in clinical status today. Discussed with the social worker who informed that the SNF is accepting patient.  I discussed with the patient, her sister, patient's 2 daughters were physicians over the phone in detail. At this time, patient will be transferred her care to outpatient at [facility]. I believe patient obtained benefit of inpatient hospitalization and can be transferred to outpatient this time with close follow-up.  Discharge Diagnoses:  Principal Problem:   Acute respiratory failure with hypercapnia (HCC) Active Problems:   Hyperlipidemia   COPD exacerbation (HCC)   Acute on chronic respiratory failure (HCC)   Chronic respiratory failure with hypoxia (HCC)   Leukocytosis   Muscle weakness (generalized)   Physical deconditioning   HSV (herpes simplex  virus) anogenital  infection    Discharge Instructions  Discharge Instructions    Call MD for:  difficulty breathing, headache or visual disturbances    Complete by:  As directed    Call MD for:  extreme fatigue    Complete by:  As directed    Call MD for:  hives    Complete by:  As directed    Call MD for:  persistant dizziness or light-headedness    Complete by:  As directed    Call MD for:  persistant nausea and vomiting    Complete by:  As directed    Call MD for:  severe uncontrolled pain    Complete by:  As directed    Call MD for:  temperature >100.4    Complete by:  As directed    Diet - low sodium heart healthy    Complete by:  As directed    Diet - low sodium heart healthy    Complete by:  As directed    Discharge instructions    Complete by:  As directed    Follow with Primary MD Cassell Smiles., MD in 3-4 days   Get CBC, CMP, 2 view Chest X ray checked  By SNF MD in 2-3 days ( we routinely change or add medications that can affect your baseline labs and fluid status, therefore we recommend that you get the mentioned basic workup next visit with your PCP, your PCP may decide not to get them or add new tests based on their clinical decision)   Activity: As tolerated with Full fall precautions use walker/cane & assistance as needed   Disposition SNF   Diet:   Heart Healthy    For Heart failure patients - Check your Weight same time everyday, if you gain over 2 pounds, or you develop in leg swelling, experience more shortness of breath or chest pain, call your Primary MD immediately. Follow Cardiac Low Salt Diet and 1.5 lit/day fluid restriction.   On your next visit with your primary care physician please Get Medicines reviewed and adjusted.   Please request your Prim.MD to go over all Hospital Tests and Procedure/Radiological results at the follow up, please get all Hospital records sent to your Prim MD by signing hospital release before you go home.   If you experience  worsening of your admission symptoms, develop shortness of breath, life threatening emergency, suicidal or homicidal thoughts you must seek medical attention immediately by calling 911 or calling your MD immediately  if symptoms less severe.  You Must read complete instructions/literature along with all the possible adverse reactions/side effects for all the Medicines you take and that have been prescribed to you. Take any new Medicines after you have completely understood and accpet all the possible adverse reactions/side effects.   Do not drive, operate heavy machinery, perform activities at heights, swimming or participation in water activities or provide baby sitting services if your were admitted for syncope or siezures until you have seen by Primary MD or a Neurologist and advised to do so again.  Do not drive when taking Pain medications.    Do not take more than prescribed Pain, Sleep and Anxiety Medications  Special Instructions: If you have smoked or chewed Tobacco  in the last 2 yrs please stop smoking, stop any regular Alcohol  and or any Recreational drug use.  Wear Seat belts while driving.   Please note  You were cared for by a hospitalist during your hospital stay.  If you have any questions about your discharge medications or the care you received while you were in the hospital after you are discharged, you can call the unit and asked to speak with the hospitalist on call if the hospitalist that took care of you is not available. Once you are discharged, your primary care physician will handle any further medical issues. Please note that NO REFILLS for any discharge medications will be authorized once you are discharged, as it is imperative that you return to your primary care physician (or establish a relationship with a primary care physician if you do not have one) for your aftercare needs so that they can reassess your need for medications and monitor your lab values.    Discharge instructions    Complete by:  As directed    With high flow oxygen and bipap. Follow up with pulmonologist Bipap setting:   14/6 with oxygen set at 7 liters.   For home use only DME oxygen    Complete by:  As directed    Mode or (Route):  Nasal cannula   Liters per Minute:  7   Frequency:  Continuous (stationary and portable oxygen unit needed)   Oxygen delivery system:  Gas   Increase activity slowly    Complete by:  As directed    Increase activity slowly    Complete by:  As directed      Allergies as of 05/27/2016      Reactions   Iohexol Anaphylaxis    Desc: IV DYE  ANAPHYLATIC SHOCK X2 ONCE AFTER PREMEDS   Chantix [varenicline] Other (See Comments)   PALPITATIONS   Ketek [telithromycin]    Penicillins Other (See Comments)   unknown      Medication List    STOP taking these medications   ALPRAZolam 0.5 MG tablet Commonly known as:  XANAX   azithromycin 250 MG tablet Commonly known as:  ZITHROMAX   levofloxacin 750 MG tablet Commonly known as:  LEVAQUIN     TAKE these medications   acyclovir 200 MG capsule Commonly known as:  ZOVIRAX Take 2 capsules (400 mg total) by mouth 3 (three) times daily.   aspirin 81 MG tablet Take 81 mg by mouth daily.   atorvastatin 40 MG tablet Commonly known as:  LIPITOR Take 1 tablet (40 mg total) by mouth daily. What changed:  when to take this   cetirizine 10 MG tablet Commonly known as:  ZYRTEC Take 10 mg by mouth daily.   clonazePAM 0.5 MG tablet Commonly known as:  KLONOPIN Take 1 tablet (0.5 mg total) by mouth at bedtime as needed for anxiety. What changed:  how much to take  when to take this  reasons to take this   DALIRESP 500 MCG Tabs tablet Generic drug:  roflumilast Take 500 mcg by mouth daily.   diltiazem 180 MG 24 hr capsule Commonly known as:  CARDIZEM CD Take 1 capsule (180 mg total) by mouth daily.   escitalopram 20 MG tablet Commonly known as:  LEXAPRO Take 20 mg by mouth  daily.   fluconazole 150 MG tablet Commonly known as:  DIFLUCAN Take 150 mg by mouth once a week.   Fluticasone-Salmeterol 250-50 MCG/DOSE Aepb Commonly known as:  ADVAIR Inhale 1 puff into the lungs 2 (two) times daily.   guaiFENesin 600 MG 12 hr tablet Commonly known as:  MUCINEX Take 1 tablet (600 mg total) by mouth 2 (two) times daily.   ibuprofen 200 MG tablet Commonly known  as:  ADVIL,MOTRIN Take 400 mg by mouth 2 (two) times daily as needed for mild pain or moderate pain.   ipratropium-albuterol 0.5-2.5 (3) MG/3ML Soln Commonly known as:  DUONEB Take 3 mLs by nebulization 3 (three) times daily.   montelukast 10 MG tablet Commonly known as:  SINGULAIR Take 1 tablet (10 mg total) by mouth at bedtime.   multivitamins ther. w/minerals Tabs tablet Take 1 tablet by mouth daily.   NASACORT ALLERGY 24HR 55 MCG/ACT Aero nasal inhaler Generic drug:  triamcinolone Place 2 sprays into the nose daily as needed (FOR ALLERGIES).   oxybutynin 5 MG tablet Commonly known as:  DITROPAN Take 5 mg by mouth daily.   predniSONE 10 MG tablet Commonly known as:  DELTASONE Take 1 tablet (10 mg total) by mouth every morning. 4 tablets for 3 days, 3 tablets for 3 days, 2 tablets for 3 days and then 1 tablet (10 mg) daily. Start taking on:  05/30/2016 What changed:  how much to take  how to take this  when to take this  additional instructions  Another medication with the same name was removed. Continue taking this medication, and follow the directions you see here.   PROAIR HFA 108 (90 Base) MCG/ACT inhaler Generic drug:  albuterol Inhale 2 puffs into the lungs every 6 (six) hours as needed. For shortness of breath   SPIRIVA RESPIMAT 2.5 MCG/ACT Aers Generic drug:  Tiotropium Bromide Monohydrate Inhale 1 puff into the lungs daily.            Durable Medical Equipment        Start     Ordered   05/27/16 0000  For home use only DME oxygen    Question Answer Comment   Mode or (Route) Nasal cannula   Liters per Minute 7   Frequency Continuous (stationary and portable oxygen unit needed)   Oxygen delivery system Gas      05/27/16 1223      Contact information for follow-up providers    FUSCO,LAWRENCE J., MD. Schedule an appointment as soon as possible for a visit in 3 day(s).   Specialty:  Internal Medicine Contact information: [address] [place] [address] [phone]        Fredirick Maudlin, MD. Schedule an appointment as soon as possible for a visit in 1 week(s).   Specialty:  Pulmonary Disease Contact information: [address] [place] [address] [phone]            Contact information for after-discharge care    Destination    [facility] Follow up.   Specialty:  Skilled Nursing Facility Contact information: [address] [place] [address] [phone]                 Allergies  Allergen Reactions  . Iohexol Anaphylaxis     Desc: IV DYE  ANAPHYLATIC SHOCK X2 ONCE AFTER PREMEDS   . Chantix [Varenicline] Other (See Comments)    PALPITATIONS  . Ketek [Telithromycin]   . Penicillins Other (See Comments)    unknown    Consultations: Pulmonologist  Procedures/Studies: BiPAP  Subjective: Patient was seen and examined at bedside. No new event today. Feels weak and has a stable shortness of breath. Currently on high flow oxygen. Denied headache, dizziness, chest pain, nausea vomiting or abdominal pain. Patient's sister at bedside.   Discharge Exam: Vitals:   05/26/16 2346 05/27/16 0630  BP: 121/70 (!) 112/57  Pulse:  81 94  Resp: 20 20  Temp: 97.3 F (36.3 C) 97.4 F (36.3 C)   Vitals:   05/26/16 2104 05/26/16 2346 05/27/16 0630 05/27/16 0745  BP:  121/70 (!) 112/57   Pulse: 87 81 94   Resp: 18 20 20    Temp:  97.3 F (36.3 C) 97.4 F (36.3 C)   TempSrc:  Axillary Axillary   SpO2: 92% 98% 98% 96%  Weight:       Height:        General: Pt is alert, awake, not in acute distress, On high flow oxygen. Cardiovascular: RRR, S1/S2 +, no rubs, no gallops Respiratory: Mild diffuse expiratory wheeze, no crackle, no accessory muscle use Abdominal: Soft, NT, ND, bowel sounds + Extremities: no edema, no cyanosis Neurology: alert, awake, oriented,   The results of significant diagnostics from this hospitalization (including imaging, microbiology, ancillary and laboratory) are listed below for reference.     Microbiology: No results found for this or any previous visit (from the past 240 hour(s)).   Labs: BNP (last 3 results)  Recent Labs  05/13/16 1929  BNP 67.0   Basic Metabolic Panel:  Recent Labs Lab 05/22/16 0556 05/23/16 0429 05/24/16 0922 05/27/16 0524  NA 138 139 136 139  K 5.2* 5.3* 5.0 3.3*  CL 83* 83* 82* 88*  CO2 47* 48* >50* 47*  GLUCOSE 196* 254* 321* 103*  BUN 18 18 19 12   CREATININE 0.63 0.60 0.74 0.53  CALCIUM 8.3* 8.1* 7.9* 7.8*  MG  --   --   --  1.9   Liver Function Tests: No results for input(s): AST, ALT, ALKPHOS, BILITOT, PROT, ALBUMIN in the last 168 hours. No results for input(s): LIPASE, AMYLASE in the last 168 hours. No results for input(s): AMMONIA in the last 168 hours. CBC:  Recent Labs Lab 05/23/16 0429 05/24/16 0922 05/27/16 0524  WBC 18.6* 28.7* 17.9*  HGB 13.5 13.1 11.5*  HCT 43.6 42.6 35.5*  MCV 106.1* 106.5* 102.3*  PLT 315 254 272   Cardiac Enzymes: No results for input(s): CKTOTAL, CKMB, CKMBINDEX, TROPONINI in the last 168 hours. BNP: Invalid input(s): POCBNP CBG:  Recent Labs Lab 05/24/16 2114  GLUCAP 151*   D-Dimer No results for input(s): DDIMER in the last 72 hours. Hgb A1c No results for input(s): HGBA1C in the last 72 hours. Lipid Profile No results for input(s): CHOL, HDL, LDLCALC, TRIG, CHOLHDL, LDLDIRECT in the last 72 hours. Thyroid function studies No results for input(s): TSH, T4TOTAL, T3FREE, THYROIDAB in  the last 72 hours.  Invalid input(s): FREET3 Anemia work up No results for input(s): VITAMINB12, FOLATE, FERRITIN, TIBC, IRON, RETICCTPCT in the last 72 hours. Urinalysis    Component Value Date/Time   COLORURINE YELLOW 01/20/2015 1957   APPEARANCEUR CLEAR 01/20/2015 1957   LABSPEC 1.010 01/20/2015 1957   PHURINE 6.5 01/20/2015 1957   GLUCOSEU NEGATIVE 01/20/2015 1957   HGBUR MODERATE (A) 01/20/2015 1957   BILIRUBINUR NEGATIVE 01/20/2015 1957   KETONESUR NEGATIVE 01/20/2015 1957   PROTEINUR NEGATIVE 01/20/2015 1957   UROBILINOGEN 0.2 01/20/2015 1957   NITRITE NEGATIVE 01/20/2015 1957   LEUKOCYTESUR NEGATIVE 01/20/2015 1957   Sepsis Labs Invalid input(s): PROCALCITONIN,  WBC,  LACTICIDVEN Microbiology No results found for this or any previous visit (from the past 240 hour(s)).   Time coordinating discharge: Over 30 minutes  SIGNED:   Maxie Barb, MD  [facility] 05/27/2016, 1:26 PM  If 7PM-7AM, please contact night-coverage [email or web address] Password [email or web address]

## 2016-05-28 DIAGNOSIS — D72829 Elevated white blood cell count, unspecified: Secondary | ICD-10-CM | POA: Diagnosis not present

## 2016-05-28 DIAGNOSIS — R5381 Other malaise: Secondary | ICD-10-CM | POA: Diagnosis not present

## 2016-05-28 DIAGNOSIS — J9620 Acute and chronic respiratory failure, unspecified whether with hypoxia or hypercapnia: Secondary | ICD-10-CM | POA: Diagnosis not present

## 2016-05-28 DIAGNOSIS — B009 Herpesviral infection, unspecified: Secondary | ICD-10-CM | POA: Diagnosis not present

## 2016-05-28 DIAGNOSIS — E784 Other hyperlipidemia: Secondary | ICD-10-CM | POA: Diagnosis not present

## 2016-05-28 DIAGNOSIS — E785 Hyperlipidemia, unspecified: Secondary | ICD-10-CM | POA: Diagnosis not present

## 2016-05-28 DIAGNOSIS — R531 Weakness: Secondary | ICD-10-CM | POA: Diagnosis not present

## 2016-05-28 DIAGNOSIS — Z7401 Bed confinement status: Secondary | ICD-10-CM | POA: Diagnosis not present

## 2016-05-28 DIAGNOSIS — J449 Chronic obstructive pulmonary disease, unspecified: Secondary | ICD-10-CM | POA: Diagnosis not present

## 2016-05-28 DIAGNOSIS — I471 Supraventricular tachycardia: Secondary | ICD-10-CM | POA: Diagnosis not present

## 2016-05-28 DIAGNOSIS — D6489 Other specified anemias: Secondary | ICD-10-CM | POA: Diagnosis not present

## 2016-05-28 DIAGNOSIS — J9621 Acute and chronic respiratory failure with hypoxia: Secondary | ICD-10-CM | POA: Diagnosis not present

## 2016-05-28 DIAGNOSIS — R41841 Cognitive communication deficit: Secondary | ICD-10-CM | POA: Diagnosis not present

## 2016-05-28 DIAGNOSIS — J9602 Acute respiratory failure with hypercapnia: Secondary | ICD-10-CM | POA: Diagnosis not present

## 2016-05-28 DIAGNOSIS — J441 Chronic obstructive pulmonary disease with (acute) exacerbation: Secondary | ICD-10-CM | POA: Diagnosis not present

## 2016-05-28 DIAGNOSIS — M6281 Muscle weakness (generalized): Secondary | ICD-10-CM | POA: Diagnosis not present

## 2016-05-28 DIAGNOSIS — J9622 Acute and chronic respiratory failure with hypercapnia: Secondary | ICD-10-CM | POA: Diagnosis not present

## 2016-05-28 DIAGNOSIS — D539 Nutritional anemia, unspecified: Secondary | ICD-10-CM | POA: Diagnosis not present

## 2016-05-28 DIAGNOSIS — A609 Anogenital herpesviral infection, unspecified: Secondary | ICD-10-CM | POA: Diagnosis not present

## 2016-05-28 DIAGNOSIS — F331 Major depressive disorder, recurrent, moderate: Secondary | ICD-10-CM | POA: Diagnosis not present

## 2016-05-28 DIAGNOSIS — F17210 Nicotine dependence, cigarettes, uncomplicated: Secondary | ICD-10-CM | POA: Diagnosis not present

## 2016-05-28 DIAGNOSIS — R279 Unspecified lack of coordination: Secondary | ICD-10-CM | POA: Diagnosis not present

## 2016-05-28 DIAGNOSIS — R7309 Other abnormal glucose: Secondary | ICD-10-CM | POA: Diagnosis not present

## 2016-05-28 DIAGNOSIS — Z8679 Personal history of other diseases of the circulatory system: Secondary | ICD-10-CM | POA: Diagnosis not present

## 2016-05-28 DIAGNOSIS — I2510 Atherosclerotic heart disease of native coronary artery without angina pectoris: Secondary | ICD-10-CM | POA: Diagnosis not present

## 2016-05-28 DIAGNOSIS — R2689 Other abnormalities of gait and mobility: Secondary | ICD-10-CM | POA: Diagnosis not present

## 2016-05-28 LAB — POTASSIUM: POTASSIUM: 3.9 mmol/L (ref 3.5–5.1)

## 2016-05-28 NOTE — Progress Notes (Signed)
Patient to be discharged to [facility] today. Patient will go to room 3213 with report:  [phone] Spoke with daughters and reviewed plan. Spoke to facility who confirms patient's family will be arriving around 3:30pm and patient's tansport to be called at 4pm.  No other needs. Communicated all information to RN.  DC to SNF today.  Lane Hacker, MSW Clinical Social Work: [employer] Coverage for :  [phone]

## 2016-05-28 NOTE — Care Management (Signed)
Spoke with patient regarding home vs SNF. Patient states she wants to go to SNF. CM spoke with daughter, Ander Purpura while in room with patient. Patient states she would feel more safe at [facility]. Lauren on board and agreeable. CSW notified.

## 2016-05-28 NOTE — Progress Notes (Signed)
Patient was seen and examined at bedside.  She was discharged to SNF yesterday however unable to leave because of issues with requirement of high flow oxygen at SNF. Patient has no new event clinically remains stable. Denied any new symptoms today.  I discussed with the patient's daughter [name] over the phone. I updated the plan of care. I also discussed with the Education officer, museum. As per Education officer, museum. Patient is okay to go to SNF today with the current medications and requirements.  I believe the patient is medically stable to transfer her care to outpatient. Again emphasized on outpatient follow-up.  Please refer to discharge summary from yesterday for details.

## 2016-05-28 NOTE — Progress Notes (Signed)
Events of yesterday noted. She was ready for transfer to skilled care facility but then they said that they were not able to take her because of high flow nasal cannula. This is done through a oxygen concentrator and does not require wall oxygen. We use high flow nasal cannula at home frequently so I don't think it should realistically be a barrier to going to a skilled care facility.  This morning she is awake and alert and looks more comfortable than she has in the last several days. I still think it's okay for her to go to skilled care facility and she just needs a high flow nasal cannula oxygen concentrator.

## 2016-05-28 NOTE — Clinical Social Work Placement (Signed)
CLINICAL SOCIAL WORK PLACEMENT  NOTE  Date:  05/28/2016  Patient Details  Name: [name] MRN: [id number] Date of Birth: 1956-04-11  Clinical Social Work is seeking post-discharge placement for this patient at the Skilled  Nursing Facility level of care (*CSW will initial, date and re-position this form in  chart as items are completed):  Yes   Patient/family provided with [place] Clinical Social Work Department's list of facilities offering this level of care within the geographic area requested by the patient (or if unable, by the patient's family).  Yes   Patient/family informed of their freedom to choose among providers that offer the needed level of care, that participate in Medicare, Medicaid or managed care program needed by the patient, have an available bed and are willing to accept the patient.  Yes   Patient/family informed of [place] [facility]'s ownership interest in [facility] and [facility], as well as of the fact that they are under no obligation to receive care at these facilities.  PASRR submitted to EDS on 05/21/16     PASRR number received on 05/21/16     Existing PASRR number confirmed on       FL2 transmitted to all facilities in geographic area requested by pt/family on 05/21/16     FL2 transmitted to all facilities within larger geographic area on       Patient informed that his/her managed care company has contracts with or will negotiate with certain facilities, including the following:        Yes   Patient/family informed of bed offers received.  Patient chooses bed at [facility]     Physician recommends and patient chooses bed at      Patient to be transferred to [facility] on 05/28/16.  Patient to be transferred to facility by [facility]     Patient family notified on 05/28/16 of transfer.  Name of family member notified:  Daughters, Judeth Cornfield and [employer]. Sister, Toneka     PHYSICIAN        Additional Comment:    _______________________________________________ Annice Needy, LCSW 05/28/2016, 11:28 AM

## 2016-05-28 NOTE — Progress Notes (Signed)
Report called to [facility], given to [place].  Patient aware of plans.  Will transfer via EMS.  Patient in NAD at this time.

## 2016-05-29 DIAGNOSIS — E784 Other hyperlipidemia: Secondary | ICD-10-CM | POA: Diagnosis not present

## 2016-05-29 DIAGNOSIS — J441 Chronic obstructive pulmonary disease with (acute) exacerbation: Secondary | ICD-10-CM | POA: Diagnosis not present

## 2016-05-29 DIAGNOSIS — R5381 Other malaise: Secondary | ICD-10-CM | POA: Diagnosis not present

## 2016-05-29 DIAGNOSIS — D6489 Other specified anemias: Secondary | ICD-10-CM | POA: Diagnosis not present

## 2016-05-29 DIAGNOSIS — I2510 Atherosclerotic heart disease of native coronary artery without angina pectoris: Secondary | ICD-10-CM | POA: Diagnosis not present

## 2016-05-29 DIAGNOSIS — D72829 Elevated white blood cell count, unspecified: Secondary | ICD-10-CM | POA: Diagnosis not present

## 2016-05-29 DIAGNOSIS — J9602 Acute respiratory failure with hypercapnia: Secondary | ICD-10-CM | POA: Diagnosis not present

## 2016-05-29 DIAGNOSIS — R531 Weakness: Secondary | ICD-10-CM | POA: Diagnosis not present

## 2016-05-29 DIAGNOSIS — J449 Chronic obstructive pulmonary disease, unspecified: Secondary | ICD-10-CM | POA: Diagnosis not present

## 2016-05-29 DIAGNOSIS — J9620 Acute and chronic respiratory failure, unspecified whether with hypoxia or hypercapnia: Secondary | ICD-10-CM | POA: Diagnosis not present

## 2016-05-29 DIAGNOSIS — F17210 Nicotine dependence, cigarettes, uncomplicated: Secondary | ICD-10-CM | POA: Diagnosis not present

## 2016-05-29 DIAGNOSIS — E785 Hyperlipidemia, unspecified: Secondary | ICD-10-CM | POA: Diagnosis not present

## 2016-05-29 DIAGNOSIS — R7309 Other abnormal glucose: Secondary | ICD-10-CM | POA: Diagnosis not present

## 2016-05-29 DIAGNOSIS — Z8679 Personal history of other diseases of the circulatory system: Secondary | ICD-10-CM | POA: Diagnosis not present

## 2016-05-29 DIAGNOSIS — B009 Herpesviral infection, unspecified: Secondary | ICD-10-CM | POA: Diagnosis not present

## 2016-05-31 DIAGNOSIS — J441 Chronic obstructive pulmonary disease with (acute) exacerbation: Secondary | ICD-10-CM | POA: Diagnosis not present

## 2016-05-31 DIAGNOSIS — J9620 Acute and chronic respiratory failure, unspecified whether with hypoxia or hypercapnia: Secondary | ICD-10-CM | POA: Diagnosis not present

## 2016-05-31 DIAGNOSIS — I471 Supraventricular tachycardia: Secondary | ICD-10-CM | POA: Diagnosis not present

## 2016-05-31 DIAGNOSIS — I2510 Atherosclerotic heart disease of native coronary artery without angina pectoris: Secondary | ICD-10-CM | POA: Diagnosis not present

## 2016-06-03 DIAGNOSIS — J9620 Acute and chronic respiratory failure, unspecified whether with hypoxia or hypercapnia: Secondary | ICD-10-CM | POA: Diagnosis not present

## 2016-06-03 DIAGNOSIS — I2510 Atherosclerotic heart disease of native coronary artery without angina pectoris: Secondary | ICD-10-CM | POA: Diagnosis not present

## 2016-06-03 DIAGNOSIS — A609 Anogenital herpesviral infection, unspecified: Secondary | ICD-10-CM | POA: Diagnosis not present

## 2016-06-03 DIAGNOSIS — J441 Chronic obstructive pulmonary disease with (acute) exacerbation: Secondary | ICD-10-CM | POA: Diagnosis not present

## 2016-06-14 ENCOUNTER — Other Ambulatory Visit: Payer: Self-pay | Admitting: *Deleted

## 2016-06-14 DIAGNOSIS — J441 Chronic obstructive pulmonary disease with (acute) exacerbation: Secondary | ICD-10-CM

## 2016-06-14 NOTE — Patient Outreach (Signed)
[facility]) Care Management  06/14/2016  [name] Jan 30, 1956 [id number]   Met with patient at bedside of facility.  Patient reports she is to discharge on 06/17/16, she is worried that home care may not be in place.  She states she does have supportive family, gave sister and 2 daughters as emergency contact.  Reviewed [facility] care management services. Patient agrees to program services.   Call to home care liaison, spoke with Cecille Rubin, she plans to visit with patient today to see if she can provide home care and DME. Patient is on HFNC and CPAP.  Left message with SW that patient anxious about getting home care and DME in place for discharge on Monday, requested her to call RNCM with any questions or issues.  Plan to refer to [facility] care management for program services.   Royetta Crochet. Laymond Purser, RN, BSN, [place] [phone]) Business Cell  [phone]) Toll Free Office

## 2016-06-18 ENCOUNTER — Other Ambulatory Visit: Payer: Self-pay | Admitting: *Deleted

## 2016-06-18 NOTE — Patient Outreach (Addendum)
[facility]) [name] Management  06/18/2016  [name] [birth year] [id number]   [name] is a 61 year old female with hx of severe COPD, coronary artery disease with history of supraventricular tachyarrythmias, HLD. She was admitted to the hospital with acute on chronic hypoxic and hypercapnic respiratory failure/COPD exacerbation on 05/13/16 and was discharged to [facility] on 05/27/16 where she was provided with rehab [name].   Prior to admission, [name] lived alone in her home in [place]. She has a sister who is 15 years her senior who lives nearby and has been staying with her in her home since she was discharged from [facility]. [name] was referred to the [facility] Community Case Management program by Verdie Drown RN, BSN/SNF Coordinator for transition of [name] services and ongoing follow up of COPD disease management.   I spoke with [name] and with her sister on 3 separate calls as [name] was sleeping and her sister kindly wanted to let her rest. When we spoke, [name] said she slept "better than I have in weeks" in her own bed. She reported not awakening until past 11am. She says her appetite is good and she is eating well. She denies any episodes of shortness of breath, coughing, chest pain, fever, or any other new or worsened symptom. She has not used her rescue inhaler since coming home.   DME: [name] confirmed that she has received ordered home O2 and CPAP from [name]. She understands how to use her equipment and relates that her O2 flow order is for 4-7l/m per [place]. She was using 4l/[place] at home prior to her recent hospitalization and said she hasn't felt the need to increase her flow rate yet, guessing that because she has been catching up on her rest and recovering, she is not exerting excessive energy. We discussed how to slowly increase her flow rate (by .5l) as needed and when it is appropriate to increase her rate. In addition, we  discussed that she should NOT increase her flow rate dramatically if she becomes more short of breath.   Medications: I reviewed medications with [name] in detail. I have forwarded questions regarding medications as outlined below to Dr. Kari Baars:   Duoneb vs. Albuterol - prior to hospital admission, [name] was using Duoneb solution for breathing treatments three times daily as scheduled doses. She has been holding this since discharge to home because she was sent home with "straight" Albuterol solution for her nebulizer. She says she has only used the Albuterol once because it made her extremely jittery.   Diflucan vs Azithromycin - prior to hospital admission, [name] was taking Diflucan once weekly and had taken it long term. At the skilled nursing facility, she was given Azithromycin three times weekly on M/W/F. Since coming home she has taken neither because she was unsure of which to take.   Follow up appointments - [name] has been seen several times during hospitalizations by Dr. Kari Baars when she has been admitted for COPD exacerbations. [name] stated she discussed with Dr. Juanetta Gosling if he would be willing to assume the position of her primary [name] provider as well as pulmonologist and he agreed. She reached out to the office and left a message. I did as well and requested that someone from the office call [name] to schedule an appointment.    Plan: I will follow up with [name] tomorrow re: the medication questions  and establishing [name] with Dr. Juanetta Gosling.    Marja Kays MHA,BSN,RN,CCM [facility] [name] Management  [phone]

## 2016-06-19 ENCOUNTER — Encounter: Payer: Self-pay | Admitting: *Deleted

## 2016-06-19 ENCOUNTER — Other Ambulatory Visit: Payer: Self-pay | Admitting: *Deleted

## 2016-06-19 DIAGNOSIS — F419 Anxiety disorder, unspecified: Secondary | ICD-10-CM | POA: Diagnosis not present

## 2016-06-19 DIAGNOSIS — Z7982 Long term (current) use of aspirin: Secondary | ICD-10-CM | POA: Diagnosis not present

## 2016-06-19 DIAGNOSIS — B009 Herpesviral infection, unspecified: Secondary | ICD-10-CM | POA: Diagnosis not present

## 2016-06-19 DIAGNOSIS — J441 Chronic obstructive pulmonary disease with (acute) exacerbation: Secondary | ICD-10-CM | POA: Diagnosis not present

## 2016-06-19 DIAGNOSIS — I2510 Atherosclerotic heart disease of native coronary artery without angina pectoris: Secondary | ICD-10-CM | POA: Diagnosis not present

## 2016-06-19 DIAGNOSIS — J9622 Acute and chronic respiratory failure with hypercapnia: Secondary | ICD-10-CM | POA: Diagnosis not present

## 2016-06-19 DIAGNOSIS — Z7952 Long term (current) use of systemic steroids: Secondary | ICD-10-CM | POA: Diagnosis not present

## 2016-06-19 DIAGNOSIS — Z742 Need for assistance at home and no other household member able to render care: Secondary | ICD-10-CM | POA: Diagnosis not present

## 2016-06-19 DIAGNOSIS — J9621 Acute and chronic respiratory failure with hypoxia: Secondary | ICD-10-CM | POA: Diagnosis not present

## 2016-06-19 DIAGNOSIS — Z9981 Dependence on supplemental oxygen: Secondary | ICD-10-CM | POA: Diagnosis not present

## 2016-06-19 DIAGNOSIS — F17210 Nicotine dependence, cigarettes, uncomplicated: Secondary | ICD-10-CM | POA: Diagnosis not present

## 2016-06-19 DIAGNOSIS — D649 Anemia, unspecified: Secondary | ICD-10-CM | POA: Diagnosis not present

## 2016-06-19 DIAGNOSIS — R7303 Prediabetes: Secondary | ICD-10-CM | POA: Diagnosis not present

## 2016-06-19 DIAGNOSIS — Z7951 Long term (current) use of inhaled steroids: Secondary | ICD-10-CM | POA: Diagnosis not present

## 2016-06-19 DIAGNOSIS — F329 Major depressive disorder, single episode, unspecified: Secondary | ICD-10-CM | POA: Diagnosis not present

## 2016-06-19 NOTE — Patient Outreach (Signed)
[facility]) Care Management  06/19/2016  [name] [birth year] [id number]  [name] has not heard from Dr. Luan Pulling office today re: establishing care or re: her medication questions. I called Dr. Luan Pulling office again x 2 & left a message requesting a return call. I notified [name] of same.   Plan: If I do not hear from Dr. Luan Pulling office by close of business today, I will stop by his office tomorrow and will follow up with [name].    [place] Management  [phone]

## 2016-06-20 ENCOUNTER — Other Ambulatory Visit: Payer: Self-pay | Admitting: *Deleted

## 2016-06-20 NOTE — Patient Outreach (Signed)
[facility]) Care Management  06/20/2016  [name] 05/28/1955 [id number]    Met face to face with Dr Luan Pulling regarding [name] transition to him for follow up care for pcp/pulmonology office staff to reach out to pt.  Also notified Dr Luan Pulling related pt questions regarding medication management.  Someone from office will reach out to her with response.   I called the home of [name] and spoke with her sister who stated pt was resting. Sister took a message indicating that [name] should hear from Dr Luan Pulling office.     Plan: I will see [name] at home on Monday 06/24/16 for home visit.    [place] Management  [phone]

## 2016-06-24 ENCOUNTER — Ambulatory Visit: Payer: Self-pay | Admitting: *Deleted

## 2016-06-25 DIAGNOSIS — I2510 Atherosclerotic heart disease of native coronary artery without angina pectoris: Secondary | ICD-10-CM | POA: Diagnosis not present

## 2016-06-25 DIAGNOSIS — J9621 Acute and chronic respiratory failure with hypoxia: Secondary | ICD-10-CM | POA: Diagnosis not present

## 2016-06-25 DIAGNOSIS — J441 Chronic obstructive pulmonary disease with (acute) exacerbation: Secondary | ICD-10-CM | POA: Diagnosis not present

## 2016-06-25 DIAGNOSIS — F329 Major depressive disorder, single episode, unspecified: Secondary | ICD-10-CM | POA: Diagnosis not present

## 2016-06-25 DIAGNOSIS — D649 Anemia, unspecified: Secondary | ICD-10-CM | POA: Diagnosis not present

## 2016-06-25 DIAGNOSIS — J9622 Acute and chronic respiratory failure with hypercapnia: Secondary | ICD-10-CM | POA: Diagnosis not present

## 2016-06-27 ENCOUNTER — Other Ambulatory Visit: Payer: Self-pay | Admitting: *Deleted

## 2016-06-27 DIAGNOSIS — I2510 Atherosclerotic heart disease of native coronary artery without angina pectoris: Secondary | ICD-10-CM | POA: Diagnosis not present

## 2016-06-27 DIAGNOSIS — D649 Anemia, unspecified: Secondary | ICD-10-CM | POA: Diagnosis not present

## 2016-06-27 DIAGNOSIS — J9622 Acute and chronic respiratory failure with hypercapnia: Secondary | ICD-10-CM | POA: Diagnosis not present

## 2016-06-27 DIAGNOSIS — J441 Chronic obstructive pulmonary disease with (acute) exacerbation: Secondary | ICD-10-CM | POA: Diagnosis not present

## 2016-06-27 DIAGNOSIS — F329 Major depressive disorder, single episode, unspecified: Secondary | ICD-10-CM | POA: Diagnosis not present

## 2016-06-27 DIAGNOSIS — J9621 Acute and chronic respiratory failure with hypoxia: Secondary | ICD-10-CM | POA: Diagnosis not present

## 2016-06-27 NOTE — Patient Outreach (Signed)
[facility]) Care Management   06/27/2016  [name] [birth year] [id number]  [name] is an 61 y.o. female with hx of severe COPD, coronary artery disease with history of supraventricular tachyarrythmias, HLD. She was admitted to the hospital with acute on chronic hypoxic and hypercapnic respiratory failure/COPD exacerbation on 05/13/16 and was discharged to [facility] on 05/27/16 where she was provided with rehab care.   I met with [name] at her home today.  Subjective: "I think I'm doing better. That was a rough ride."  Objective:  BP (!) 100/58   Pulse (!) 107 Comment: after walking  SpO2 93% Comment: after walking  Review of Systems  Constitutional: Negative.   HENT: Negative.   Eyes: Negative.   Respiratory: Positive for cough, sputum production, shortness of breath and wheezing. Negative for hemoptysis.        Mild expiratory wheezes; shortened expiratory phase; mild cough productive of small amounts of clear to white sputum which patient reports is her baseline  Cardiovascular: Positive for orthopnea. Negative for chest pain, palpitations, leg swelling and PND.  Gastrointestinal: Negative.   Genitourinary: Negative.   Musculoskeletal: Negative.  Negative for falls.  Skin: Negative.   Neurological: Negative.   Psychiatric/Behavioral: Negative.     Physical Exam  Constitutional: She appears well-developed and well-nourished. She is active. She has a sickly appearance. She appears ill.  Cardiovascular: Regular rhythm and normal heart sounds.  Tachycardia present.   No murmur heard. Respiratory: No tachypnea. No respiratory distress. She has decreased breath sounds in the right upper field, the right middle field, the right lower field, the left upper field, the left middle field and the left lower field. She has wheezes. She has no rhonchi. She has no rales.  GI: Soft. Bowel sounds are normal.  Neurological: She is alert.  Skin: Skin is  warm, dry and intact.  Psychiatric: She has a normal mood and affect. Her speech is normal and behavior is normal. Judgment and thought content normal. Cognition and memory are normal.    Encounter Medications:   Outpatient Encounter Prescriptions as of 06/27/2016  Medication Sig Note  . albuterol (PROAIR HFA) 108 (90 BASE) MCG/ACT inhaler Inhale 2 puffs into the lungs every 6 (six) hours as needed. For shortness of breath   . aspirin 81 MG tablet Take 81 mg by mouth daily.   Marland Kitchen atorvastatin (LIPITOR) 40 MG tablet Take 1 tablet (40 mg total) by mouth daily. (Patient taking differently: Take 40 mg by mouth every evening. )   . cetirizine (ZYRTEC) 10 MG tablet Take 10 mg by mouth daily.     . clonazePAM (KLONOPIN) 0.5 MG tablet Take 1 tablet (0.5 mg total) by mouth at bedtime as needed for anxiety. (Patient not taking: Reported on 06/19/2016) 06/19/2016: Has on hand; has not been taking  . diltiazem (CARDIZEM CD) 180 MG 24 hr capsule Take 1 capsule (180 mg total) by mouth daily.   Marland Kitchen escitalopram (LEXAPRO) 20 MG tablet Take 20 mg by mouth daily.     . fluconazole (DIFLUCAN) 150 MG tablet Take 150 mg by mouth once a week.    . Fluticasone-Salmeterol (ADVAIR) 250-50 MCG/DOSE AEPB Inhale 1 puff into the lungs 2 (two) times daily.    Marland Kitchen guaiFENesin (MUCINEX) 600 MG 12 hr tablet Take 1 tablet (600 mg total) by mouth 2 (two) times daily.   Marland Kitchen ibuprofen (ADVIL,MOTRIN) 200 MG tablet Take 400 mg by mouth 2 (two) times daily as needed for  mild pain or moderate pain.   Marland Kitchen ipratropium-albuterol (DUONEB) 0.5-2.5 (3) MG/3ML SOLN Take 3 mLs by nebulization 3 (three) times daily. (Patient not taking: Reported on 06/19/2016) 06/19/2016: Has on hand; not using  . montelukast (SINGULAIR) 10 MG tablet Take 1 tablet (10 mg total) by mouth at bedtime.   . Multiple Vitamins-Minerals (MULTIVITAMINS THER. W/MINERALS) TABS Take 1 tablet by mouth daily.     Marland Kitchen oxybutynin (DITROPAN) 5 MG tablet Take 5 mg by mouth daily.  09/08/2013:  Received from: External Pharmacy  . predniSONE (DELTASONE) 10 MG tablet Take 1 tablet (10 mg total) by mouth every morning. 4 tablets for 3 days, 3 tablets for 3 days, 2 tablets for 3 days and then 1 tablet (10 mg) daily. (Patient not taking: Reported on 06/19/2016) 06/19/2016: Not taking; takes 10mg  po qd   . roflumilast (DALIRESP) 500 MCG TABS tablet Take 500 mcg by mouth daily.     Marland Kitchen SPIRIVA RESPIMAT 2.5 MCG/ACT AERS Inhale 1 puff into the lungs daily.  11/14/2014: Received from: External Pharmacy  . triamcinolone (NASACORT ALLERGY 24HR) 55 MCG/ACT AERO nasal inhaler Place 2 sprays into the nose daily as needed (FOR ALLERGIES). 06/19/2016: prn   Assessment:  61 year old female recently discharged to home after hospitalization for acute on chronic hypoxic and hypercapnic respiratory failure/COPD exacerbation followed by SNF stay for rehabilitation and respiratory management.   [name] is talkative and engaged today. She says her breathing is at baseline and she feels comfortable talking or moving about the house. Her O2 Saturation on 3.5l/[place] drops from 95-96% to 93% when she ambulates. She is using BIPAP at night and is taking all medications as prescribed. [name] saw Dr. Garnette Czech in the office as a new patient for primary and pulmonary care.   Primary Complaints:  Burning Throat - upon examination no redness, swelling, or exudates are noted Tachycardia - she notes having felt "palpitations" and "speedy heart rate" intermittently since coming home; says she takes extra Diltiazem when her heart rate stays higher than 130; she is tachycardic today with HR between 101 and 107 during my visit  *note* [name] only began using her prescribed Duoneb breathing treatments TID since discharge from SNF to home.    Plan: [name] will monitor her heart rate over the weekend and I will follow up on Monday to see if she noted sustained periods of tachycardia. [name] will continue to take all  medicatons as prescribed and will call for new or worsened symptoms.   [facility] CM Care Plan Problem One   Flowsheet Row Most Recent Value  Care Plan Problem One  Knowledge Deficits related to new plan of care for long term self health management of COPD  Role Documenting the Problem One  Care Management Coordinator  Care Plan for Problem One  Active  THN Long Term Goal (31-90 days)  Over the next 60 days, patient will verbalize understanding of plan of care for long term self health mangaement of COPD as established by pulmonologist/PCP  THN Long Term Goal Start Date  06/19/16  Interventions for Problem One Long Term Goal  Discussed patient course of illness and plan in place prior to hospital admission,  outreach to PCP/Pulmonologist to establish care and establish new plan of care  THN CM Short Term Goal #1 (0-30 days)  Over the next 10 days, patient will see provider (pulmonolgist) for initial office visit and post facility follow up  [facility] CM Short Term Goal #1  Start Date  06/19/16  Interventions for Short Term Goal #1  Outreach to provider x 2 to request call to patient with new patient appointment  THN CM Short Term Goal #2 (0-30 days)  Over the next 30 days, patient will verbalize and demonstrate understanding of use of DME (HFNC and CPAP)  THN CM Short Term Goal #2 Start Date  06/19/16  Interventions for Short Term Goal #2  Discussed instructions provided at SNF and by DME company regarding use of HFNC and CPAP,  addressed questions  THN CM Short Term Goal #3 (0-30 days)  Over the next 30 days, patient will verbalize understanding of when to call provider for worsening symptoms  THN CM Short Term Goal #3 Start Date  06/19/16  Interventions for Short Tern Goal #3  Utilizing teachback method, reviewed with patient signs and symptoms of worsening pulmonary condition warranting call to provider    [facility] CM Care Plan Problem Two   Flowsheet Row Most Recent Value  Care Plan Problem Two  Knowledge  Deficit related to management of tachycardia as evidenced by report of same  Role Documenting the Problem Two  Care Management Coordinator  Care Plan for Problem Two  Active  Interventions for Problem Two Long Term Goal   medication review performed,  symptoms noted and forwarded to PCP and cardiology via secure electronic messaging  THN Long Term Goal (31-90) days  Over the next 31 days, patient will verablize understanding of plan of care for management of tachycardia  THN Long Term Goal Start Date  06/27/16  THN CM Short Term Goal #1 (0-30 days)  Over the next 7 days, patient will monitor and record heart rate and any symptoms related to high heart rate  THN CM Short Term Goal #1 Start Date  06/27/16  Interventions for Short Term Goal #2   Advised patient to monitor heart rate,  provided hands on instruction re: taking pulse and measuing with pulse oximeter      [name] MHA,BSN,RN,CCM [facility] Care Management  [phone]

## 2016-07-01 ENCOUNTER — Other Ambulatory Visit: Payer: Self-pay | Admitting: *Deleted

## 2016-07-01 DIAGNOSIS — D649 Anemia, unspecified: Secondary | ICD-10-CM | POA: Diagnosis not present

## 2016-07-01 DIAGNOSIS — I2510 Atherosclerotic heart disease of native coronary artery without angina pectoris: Secondary | ICD-10-CM | POA: Diagnosis not present

## 2016-07-01 DIAGNOSIS — F329 Major depressive disorder, single episode, unspecified: Secondary | ICD-10-CM | POA: Diagnosis not present

## 2016-07-01 DIAGNOSIS — J9622 Acute and chronic respiratory failure with hypercapnia: Secondary | ICD-10-CM | POA: Diagnosis not present

## 2016-07-01 DIAGNOSIS — J441 Chronic obstructive pulmonary disease with (acute) exacerbation: Secondary | ICD-10-CM | POA: Diagnosis not present

## 2016-07-01 DIAGNOSIS — J9621 Acute and chronic respiratory failure with hypoxia: Secondary | ICD-10-CM | POA: Diagnosis not present

## 2016-07-01 NOTE — Patient Outreach (Signed)
[facility]) Care Management  07/01/2016  [name] [birth year] [id number]  Unable to reach [name] by phone this afternoon to follow up on COPD management and HR monitoring over the weekend.   Plan: I will reach out to Mrs. Bartnicki again tomorrow by phone.    [place] Management  [phone]

## 2016-07-02 ENCOUNTER — Other Ambulatory Visit: Payer: Self-pay | Admitting: *Deleted

## 2016-07-02 DIAGNOSIS — J9611 Chronic respiratory failure with hypoxia: Secondary | ICD-10-CM | POA: Diagnosis not present

## 2016-07-02 DIAGNOSIS — I10 Essential (primary) hypertension: Secondary | ICD-10-CM | POA: Diagnosis not present

## 2016-07-02 DIAGNOSIS — F321 Major depressive disorder, single episode, moderate: Secondary | ICD-10-CM | POA: Diagnosis not present

## 2016-07-02 DIAGNOSIS — J449 Chronic obstructive pulmonary disease, unspecified: Secondary | ICD-10-CM | POA: Diagnosis not present

## 2016-07-02 NOTE — Patient Outreach (Signed)
[facility]) Care Management  07/02/2016  JOSSETTE RISSO [birth year] [id number]  Unable to reach Mrs. Starlin by phone for follow up of COPD and tachycardia. I left a HIPPA compliant message requesting a return call.   Plan: I will reach out to Mrs. Sanpedro on Thursday if I've not heard from her in the next day.    [place] Management  [phone]

## 2016-07-03 DIAGNOSIS — D649 Anemia, unspecified: Secondary | ICD-10-CM | POA: Diagnosis not present

## 2016-07-03 DIAGNOSIS — J441 Chronic obstructive pulmonary disease with (acute) exacerbation: Secondary | ICD-10-CM | POA: Diagnosis not present

## 2016-07-03 DIAGNOSIS — J9621 Acute and chronic respiratory failure with hypoxia: Secondary | ICD-10-CM | POA: Diagnosis not present

## 2016-07-03 DIAGNOSIS — F329 Major depressive disorder, single episode, unspecified: Secondary | ICD-10-CM | POA: Diagnosis not present

## 2016-07-03 DIAGNOSIS — I2510 Atherosclerotic heart disease of native coronary artery without angina pectoris: Secondary | ICD-10-CM | POA: Diagnosis not present

## 2016-07-03 DIAGNOSIS — J9622 Acute and chronic respiratory failure with hypercapnia: Secondary | ICD-10-CM | POA: Diagnosis not present

## 2016-07-04 ENCOUNTER — Other Ambulatory Visit: Payer: Self-pay | Admitting: *Deleted

## 2016-07-04 NOTE — Patient Outreach (Signed)
[facility]) Care Management  07/04/2016  [name] 1956/04/21 [id number]   Unable to reach [name] by phone today.   Plan: I will reach out to her again tomorrow.    [place] Management  [phone]

## 2016-07-05 ENCOUNTER — Encounter: Payer: Self-pay | Admitting: *Deleted

## 2016-07-05 ENCOUNTER — Other Ambulatory Visit: Payer: Self-pay | Admitting: *Deleted

## 2016-07-05 NOTE — Patient Outreach (Signed)
[facility]) Care Management  07/05/2016  [name] [birth year] [id number]  I have been unable to reach [name] by phone this week but was able to speak with her primary home health nurse Judson Roch who reported that on nursing visit 07/01/16, [name]'s HR 90, other vitals signs stable, breath sounds were clear, O2 sat 98% on 3.5L.   During my initial visit with [name], she expressed some concern about elevated heart rate. On the day of my visit HR was 110's. I discussed this with her cardiologist, Dr. Rozann Lesches. I also asked [name] to check her pulse and keep a record over the next several days and to call her cardiologist should she experience new or worsening symptoms.   Plan: I will continue to collaborate with the home health team and will follow up with [name] by phone next week.    [place] Management  [phone]

## 2016-07-08 DIAGNOSIS — I2510 Atherosclerotic heart disease of native coronary artery without angina pectoris: Secondary | ICD-10-CM | POA: Diagnosis not present

## 2016-07-08 DIAGNOSIS — J441 Chronic obstructive pulmonary disease with (acute) exacerbation: Secondary | ICD-10-CM | POA: Diagnosis not present

## 2016-07-08 DIAGNOSIS — J9622 Acute and chronic respiratory failure with hypercapnia: Secondary | ICD-10-CM | POA: Diagnosis not present

## 2016-07-08 DIAGNOSIS — D649 Anemia, unspecified: Secondary | ICD-10-CM | POA: Diagnosis not present

## 2016-07-08 DIAGNOSIS — J9621 Acute and chronic respiratory failure with hypoxia: Secondary | ICD-10-CM | POA: Diagnosis not present

## 2016-07-08 DIAGNOSIS — F329 Major depressive disorder, single episode, unspecified: Secondary | ICD-10-CM | POA: Diagnosis not present

## 2016-07-10 DIAGNOSIS — F329 Major depressive disorder, single episode, unspecified: Secondary | ICD-10-CM | POA: Diagnosis not present

## 2016-07-10 DIAGNOSIS — J9622 Acute and chronic respiratory failure with hypercapnia: Secondary | ICD-10-CM | POA: Diagnosis not present

## 2016-07-10 DIAGNOSIS — J9621 Acute and chronic respiratory failure with hypoxia: Secondary | ICD-10-CM | POA: Diagnosis not present

## 2016-07-10 DIAGNOSIS — J441 Chronic obstructive pulmonary disease with (acute) exacerbation: Secondary | ICD-10-CM | POA: Diagnosis not present

## 2016-07-10 DIAGNOSIS — I2510 Atherosclerotic heart disease of native coronary artery without angina pectoris: Secondary | ICD-10-CM | POA: Diagnosis not present

## 2016-07-10 DIAGNOSIS — D649 Anemia, unspecified: Secondary | ICD-10-CM | POA: Diagnosis not present

## 2016-07-12 DIAGNOSIS — J9622 Acute and chronic respiratory failure with hypercapnia: Secondary | ICD-10-CM | POA: Diagnosis not present

## 2016-07-12 DIAGNOSIS — J9621 Acute and chronic respiratory failure with hypoxia: Secondary | ICD-10-CM | POA: Diagnosis not present

## 2016-07-12 DIAGNOSIS — F329 Major depressive disorder, single episode, unspecified: Secondary | ICD-10-CM | POA: Diagnosis not present

## 2016-07-12 DIAGNOSIS — J441 Chronic obstructive pulmonary disease with (acute) exacerbation: Secondary | ICD-10-CM | POA: Diagnosis not present

## 2016-07-12 DIAGNOSIS — I2510 Atherosclerotic heart disease of native coronary artery without angina pectoris: Secondary | ICD-10-CM | POA: Diagnosis not present

## 2016-07-12 DIAGNOSIS — D649 Anemia, unspecified: Secondary | ICD-10-CM | POA: Diagnosis not present

## 2016-07-16 ENCOUNTER — Other Ambulatory Visit: Payer: Self-pay | Admitting: *Deleted

## 2016-07-17 DIAGNOSIS — F329 Major depressive disorder, single episode, unspecified: Secondary | ICD-10-CM | POA: Diagnosis not present

## 2016-07-17 DIAGNOSIS — I2510 Atherosclerotic heart disease of native coronary artery without angina pectoris: Secondary | ICD-10-CM | POA: Diagnosis not present

## 2016-07-17 DIAGNOSIS — J9622 Acute and chronic respiratory failure with hypercapnia: Secondary | ICD-10-CM | POA: Diagnosis not present

## 2016-07-17 DIAGNOSIS — J441 Chronic obstructive pulmonary disease with (acute) exacerbation: Secondary | ICD-10-CM | POA: Diagnosis not present

## 2016-07-17 DIAGNOSIS — J9621 Acute and chronic respiratory failure with hypoxia: Secondary | ICD-10-CM | POA: Diagnosis not present

## 2016-07-17 DIAGNOSIS — D649 Anemia, unspecified: Secondary | ICD-10-CM | POA: Diagnosis not present

## 2016-07-18 DIAGNOSIS — J9622 Acute and chronic respiratory failure with hypercapnia: Secondary | ICD-10-CM | POA: Diagnosis not present

## 2016-07-18 DIAGNOSIS — F419 Anxiety disorder, unspecified: Secondary | ICD-10-CM | POA: Diagnosis not present

## 2016-07-18 DIAGNOSIS — F329 Major depressive disorder, single episode, unspecified: Secondary | ICD-10-CM | POA: Diagnosis not present

## 2016-07-18 DIAGNOSIS — J9611 Chronic respiratory failure with hypoxia: Secondary | ICD-10-CM | POA: Diagnosis not present

## 2016-07-18 DIAGNOSIS — D649 Anemia, unspecified: Secondary | ICD-10-CM | POA: Diagnosis not present

## 2016-07-18 DIAGNOSIS — J441 Chronic obstructive pulmonary disease with (acute) exacerbation: Secondary | ICD-10-CM | POA: Diagnosis not present

## 2016-07-18 DIAGNOSIS — I10 Essential (primary) hypertension: Secondary | ICD-10-CM | POA: Diagnosis not present

## 2016-07-18 DIAGNOSIS — J449 Chronic obstructive pulmonary disease, unspecified: Secondary | ICD-10-CM | POA: Diagnosis not present

## 2016-07-18 DIAGNOSIS — I2510 Atherosclerotic heart disease of native coronary artery without angina pectoris: Secondary | ICD-10-CM | POA: Diagnosis not present

## 2016-07-18 DIAGNOSIS — J9621 Acute and chronic respiratory failure with hypoxia: Secondary | ICD-10-CM | POA: Diagnosis not present

## 2016-07-19 ENCOUNTER — Other Ambulatory Visit: Payer: Self-pay | Admitting: *Deleted

## 2016-07-19 NOTE — Patient Outreach (Signed)
[facility]) Care Management  07/19/2016  [name] 1956/02/20 [id number]  I have been unable to reach [name] by phone more than once since I saw her at home on 06/27/16 for our initial home visit. I was able to speak with her primary home health nurse Judson Roch who reported that she continues to visit [name] at home whom she feels is doing well.    Plan: I will continue to collaborate with the home health team and will follow up with [name] by phone.    [place] Management  [phone]

## 2016-07-22 DIAGNOSIS — D649 Anemia, unspecified: Secondary | ICD-10-CM | POA: Diagnosis not present

## 2016-07-22 DIAGNOSIS — I2510 Atherosclerotic heart disease of native coronary artery without angina pectoris: Secondary | ICD-10-CM | POA: Diagnosis not present

## 2016-07-22 DIAGNOSIS — J9621 Acute and chronic respiratory failure with hypoxia: Secondary | ICD-10-CM | POA: Diagnosis not present

## 2016-07-22 DIAGNOSIS — F329 Major depressive disorder, single episode, unspecified: Secondary | ICD-10-CM | POA: Diagnosis not present

## 2016-07-22 DIAGNOSIS — J441 Chronic obstructive pulmonary disease with (acute) exacerbation: Secondary | ICD-10-CM | POA: Diagnosis not present

## 2016-07-22 DIAGNOSIS — J9622 Acute and chronic respiratory failure with hypercapnia: Secondary | ICD-10-CM | POA: Diagnosis not present

## 2016-07-23 DIAGNOSIS — F329 Major depressive disorder, single episode, unspecified: Secondary | ICD-10-CM | POA: Diagnosis not present

## 2016-07-23 DIAGNOSIS — J441 Chronic obstructive pulmonary disease with (acute) exacerbation: Secondary | ICD-10-CM | POA: Diagnosis not present

## 2016-07-23 DIAGNOSIS — D649 Anemia, unspecified: Secondary | ICD-10-CM | POA: Diagnosis not present

## 2016-07-23 DIAGNOSIS — J9621 Acute and chronic respiratory failure with hypoxia: Secondary | ICD-10-CM | POA: Diagnosis not present

## 2016-07-23 DIAGNOSIS — I2510 Atherosclerotic heart disease of native coronary artery without angina pectoris: Secondary | ICD-10-CM | POA: Diagnosis not present

## 2016-07-23 DIAGNOSIS — J9622 Acute and chronic respiratory failure with hypercapnia: Secondary | ICD-10-CM | POA: Diagnosis not present

## 2016-07-24 DIAGNOSIS — D649 Anemia, unspecified: Secondary | ICD-10-CM | POA: Diagnosis not present

## 2016-07-24 DIAGNOSIS — R0602 Shortness of breath: Secondary | ICD-10-CM | POA: Diagnosis not present

## 2016-07-24 DIAGNOSIS — J441 Chronic obstructive pulmonary disease with (acute) exacerbation: Secondary | ICD-10-CM | POA: Diagnosis not present

## 2016-07-24 DIAGNOSIS — J9621 Acute and chronic respiratory failure with hypoxia: Secondary | ICD-10-CM | POA: Diagnosis not present

## 2016-07-24 DIAGNOSIS — I2510 Atherosclerotic heart disease of native coronary artery without angina pectoris: Secondary | ICD-10-CM | POA: Diagnosis not present

## 2016-07-24 DIAGNOSIS — F329 Major depressive disorder, single episode, unspecified: Secondary | ICD-10-CM | POA: Diagnosis not present

## 2016-07-24 DIAGNOSIS — J9622 Acute and chronic respiratory failure with hypercapnia: Secondary | ICD-10-CM | POA: Diagnosis not present

## 2016-07-25 ENCOUNTER — Other Ambulatory Visit (HOSPITAL_COMMUNITY): Payer: Self-pay | Admitting: Pulmonary Disease

## 2016-07-25 DIAGNOSIS — R51 Headache: Principal | ICD-10-CM

## 2016-07-25 DIAGNOSIS — R519 Headache, unspecified: Secondary | ICD-10-CM

## 2016-07-26 ENCOUNTER — Ambulatory Visit (HOSPITAL_COMMUNITY)
Admission: RE | Admit: 2016-07-26 | Discharge: 2016-07-26 | Disposition: A | Discharge status: Home / Self Care | Payer: Medicare Other | Source: Ambulatory Visit | Attending: Pulmonary Disease | Admitting: Pulmonary Disease

## 2016-07-26 ENCOUNTER — Other Ambulatory Visit: Payer: Self-pay | Admitting: *Deleted

## 2016-07-26 DIAGNOSIS — R519 Headache, unspecified: Secondary | ICD-10-CM

## 2016-07-26 DIAGNOSIS — R51 Headache: Secondary | ICD-10-CM | POA: Insufficient documentation

## 2016-07-26 DIAGNOSIS — Z9889 Other specified postprocedural states: Secondary | ICD-10-CM | POA: Insufficient documentation

## 2016-07-26 LAB — GRAM STAIN: Gram Stain: NONE SEEN

## 2016-07-26 LAB — CSF CELL COUNT WITH DIFFERENTIAL
RBC Count, CSF: 2 /mm3 — ABNORMAL HIGH
TUBE #: 4
WBC CSF: 1 /mm3 (ref 0–5)

## 2016-07-26 LAB — GLUCOSE, CSF: GLUCOSE CSF: 73 mg/dL — AB (ref 40–70)

## 2016-07-26 LAB — PROTEIN, CSF: TOTAL PROTEIN, CSF: 15 mg/dL (ref 15–45)

## 2016-07-26 MED ORDER — ACETAMINOPHEN 325 MG PO TABS: 650.0000 mg | ORAL_TABLET | ORAL | Status: DC | PRN

## 2016-07-26 NOTE — Patient Outreach (Signed)
[facility]) Care Management  07/26/2016  [name] 26-Aug-1955 [id number]  I have been unable to reach [name] by phone since our first visit several weeks ago. I have reached out to the patient care coordinator at [facility] to try to collaborate on [name] care. If she does not wish to engage with [place] Management services at this time, we will close her case as engagement with our services is voluntary. However, I wish to exhaust all offerings to her as we also wish to provide assistance to her for chronic disease management and care coordination needs if we can be of assistance.   Plan: Follow up next week by phone.    [place] Management  [phone]

## 2016-07-26 NOTE — Procedures (Signed)
Preprocedure Dx: Fever, headache, stiff neck, question meningitis Postprocedure Dx: Fever, headache, stiff neck, question meningitis Procedure:  Fluoroscopically guided lumbar puncture Radiologist:  Thornton Papas Anesthesia:  3 ml of 1% lidocaine Specimen:  10.5 ml CSF,clear colorless EBL:   < 1 ml Opening pressure: 19 cm E5I (prone) Complications: None

## 2016-07-26 NOTE — Discharge Instructions (Signed)
Lumbar Puncture, Care After Refer to this sheet in the next few weeks. These instructions provide you with information on caring for yourself after your procedure. Your health care provider may also give you more specific instructions. Your treatment has been planned according to current medical practices, but problems sometimes occur. Call your health care provider if you have any problems or questions after your procedure. What can I expect after the procedure? After your procedure, it is typical to have the following sensations:  Mild discomfort or pain at the insertion site.  Mild headache that is relieved with pain medicines. Follow these instructions at home:   Avoid lifting anything heavier than 10 lb (4.5 kg) for at least 12 hours after the procedure.  Drink enough fluids to keep your urine clear or pale yellow. Contact a health care provider if:  You have fever or chills.  You have nausea or vomiting.  You have a headache that lasts for more than 2 days. Get help right away if:  You have any numbness or tingling in your legs.  You are unable to control your bowel or bladder.  You have bleeding or swelling in your back at the insertion site.  You are dizzy or faint. This information is not intended to replace advice given to you by your health care provider. Make sure you discuss any questions you have with your health care provider. Document Released: 04/27/2013 Document Revised: 09/28/2015 Document Reviewed: 12/29/2012 Elsevier Interactive Patient Education  2017 [place]. Lumbar Puncture A lumbar puncture, or spinal tap, is a procedure in which a small amount of the fluid that surrounds the brain and spinal cord is removed and examined. The fluid is called the cerebrospinal fluid. This procedure may be done to:  Help diagnose various problems, such as meningitis, encephalitis, multiple sclerosis, and AIDS.  Remove fluid and relieve pressure that occurs with  certain types of headaches.  Look for bleeding within the brain and spinal cord areas (central nervous system).  Place medicine into the spinal fluid. Tell a health care provider about:  Any allergies you have.  All medicines you are taking, including vitamins, herbs, eye drops, creams, and over-the-counter medicines.  Any problems you or family members have had with anesthetic medicines.  Any blood disorders you have.  Any surgeries you have had.  Any medical conditions you have. What are the risks? Generally, this is a safe procedure. However, as with any procedure, complications can occur. Possible complications include:  Spinal headache. This is a severe headache that occurs when there is a leak of spinal fluid. A spinal headache causes discomfort but is not dangerous. If it persists, another procedure may be done to treat the headache.  Bleeding. This most often occurs in people with bleeding disorders. These are disorders in which the blood does not clot normally.  Infection at the insertion site that can spread to the bone or spinal fluid.  Formation of a spinal cord tumor (rare).  Brain herniation or movement of the brain into the spinal cord (rare).  Inability to move (extremely rare). What happens before the procedure?  You may have blood tests done. These tests can help tell how well your kidneys and liver are working. They can also show how well your blood clots.  If you take blood thinners (anticoagulant medicine), ask your health care provider if and when you should stop taking them.  Your health care provider may order a CT scan of your brain.  Make arrangements  for someone to drive you home after the procedure. What happens during the procedure?  You will be positioned so that the spaces between the bones of the spine (vertebrae) are as wide as possible. This will make it easier to pass the needle into the spinal canal.  Depending on your age and size, you  may lie on your side, curled up with your knees under your chin. Or, you may sit with your head resting on a pillow that is placed at waist level.  The skin covering the lower back (or lumbar region) will be cleaned.  The skin may be numbed with medicine.  You may be given pain medicine or a medicine to help you relax (sedative).  A small needle will be inserted in the skin until it enters the space that contains the spinal fluid. The needle will not enter the spinal cord.  The spinal fluid will be collected into tubes.  The needle will be withdrawn, and a bandage will be placed on the site. What happens after the procedure?  You will remain lying down for 1 hour or for as long as your health care provider suggests.  The spinal fluid will be sent to a laboratory to be examined. The results of the examination may be available before you go home.  A test, called a culture, may be taken of the spinal fluid if your health care provider thinks you have an infection. If cultures were taken for exam, the results will usually be available in a couple of days. This information is not intended to replace advice given to you by your health care provider. Make sure you discuss any questions you have with your health care provider. Document Released: 04/19/2000 Document Revised: 09/28/2015 Document Reviewed: 12/28/2012 Elsevier Interactive Patient Education  2017 [employer].

## 2016-07-26 NOTE — Progress Notes (Signed)
Patient received from Radiology s/p LP.  Dressing CDI.  Denies pain.  Family at bedside.  Diet tray ordered.  Patient comfortable at this time.

## 2016-07-26 NOTE — Discharge Instructions (Signed)
Lumbar Puncture, Care After °Refer to this sheet in the next few weeks. These instructions provide you with information on caring for yourself after your procedure. Your health care provider may also give you more specific instructions. Your treatment has been planned according to current medical practices, but problems sometimes occur. Call your health care provider if you have any problems or questions after your procedure. °What can I expect after the procedure? °After your procedure, it is typical to have the following sensations: °· Mild discomfort or pain at the insertion site. °· Mild headache that is relieved with pain medicines. ° °Follow these instructions at home: ° °· Avoid lifting anything heavier than 10 lb (4.5 kg) for at least 12 hours after the procedure. °· Drink enough fluids to keep your urine clear or pale yellow. °Contact a health care provider if: °· You have fever or chills. °· You have nausea or vomiting. °· You have a headache that lasts for more than 2 days. °Get help right away if: °· You have any numbness or tingling in your legs. °· You are unable to control your bowel or bladder. °· You have bleeding or swelling in your back at the insertion site. °· You are dizzy or faint. °This information is not intended to replace advice given to you by your health care provider. Make sure you discuss any questions you have with your health care provider. °Document Released: 04/27/2013 Document Revised: 09/28/2015 Document Reviewed: 12/29/2012 °Elsevier Interactive Patient Education © 2017 Elsevier Inc. ° °

## 2016-07-29 LAB — CSF CULTURE
CULTURE: NO GROWTH
GRAM STAIN: NONE SEEN

## 2016-07-29 LAB — CSF CULTURE W GRAM STAIN

## 2016-07-30 DIAGNOSIS — J9621 Acute and chronic respiratory failure with hypoxia: Secondary | ICD-10-CM | POA: Diagnosis not present

## 2016-07-30 DIAGNOSIS — J441 Chronic obstructive pulmonary disease with (acute) exacerbation: Secondary | ICD-10-CM | POA: Diagnosis not present

## 2016-07-30 DIAGNOSIS — J9622 Acute and chronic respiratory failure with hypercapnia: Secondary | ICD-10-CM | POA: Diagnosis not present

## 2016-07-30 DIAGNOSIS — F329 Major depressive disorder, single episode, unspecified: Secondary | ICD-10-CM | POA: Diagnosis not present

## 2016-07-30 DIAGNOSIS — I2510 Atherosclerotic heart disease of native coronary artery without angina pectoris: Secondary | ICD-10-CM | POA: Diagnosis not present

## 2016-07-30 DIAGNOSIS — D649 Anemia, unspecified: Secondary | ICD-10-CM | POA: Diagnosis not present

## 2016-07-31 ENCOUNTER — Encounter: Payer: Self-pay | Admitting: *Deleted

## 2016-07-31 ENCOUNTER — Other Ambulatory Visit: Payer: Self-pay | Admitting: *Deleted

## 2016-07-31 DIAGNOSIS — I10 Essential (primary) hypertension: Secondary | ICD-10-CM | POA: Diagnosis not present

## 2016-07-31 DIAGNOSIS — J9611 Chronic respiratory failure with hypoxia: Secondary | ICD-10-CM | POA: Diagnosis not present

## 2016-07-31 DIAGNOSIS — R591 Generalized enlarged lymph nodes: Secondary | ICD-10-CM | POA: Diagnosis not present

## 2016-07-31 DIAGNOSIS — J449 Chronic obstructive pulmonary disease, unspecified: Secondary | ICD-10-CM | POA: Diagnosis not present

## 2016-07-31 NOTE — Patient Outreach (Signed)
[facility] [facility]) Care Management  07/31/2016  VEGA STARE [birth year] [id number]  I was notified by phone by Mrs. Doreene Eland, patient care coordinator at [facility] that Mrs. Sleeper is now in skilled nursing facility care.  Plan: I will close Mrs. Mccune' case.    [address] Management  [phone]

## 2016-08-01 ENCOUNTER — Other Ambulatory Visit (HOSPITAL_COMMUNITY): Payer: Self-pay | Admitting: Pulmonary Disease

## 2016-08-01 DIAGNOSIS — J441 Chronic obstructive pulmonary disease with (acute) exacerbation: Secondary | ICD-10-CM | POA: Diagnosis not present

## 2016-08-01 DIAGNOSIS — I2510 Atherosclerotic heart disease of native coronary artery without angina pectoris: Secondary | ICD-10-CM | POA: Diagnosis not present

## 2016-08-01 DIAGNOSIS — F329 Major depressive disorder, single episode, unspecified: Secondary | ICD-10-CM | POA: Diagnosis not present

## 2016-08-01 DIAGNOSIS — R591 Generalized enlarged lymph nodes: Secondary | ICD-10-CM

## 2016-08-01 DIAGNOSIS — D649 Anemia, unspecified: Secondary | ICD-10-CM | POA: Diagnosis not present

## 2016-08-01 DIAGNOSIS — J9622 Acute and chronic respiratory failure with hypercapnia: Secondary | ICD-10-CM | POA: Diagnosis not present

## 2016-08-01 DIAGNOSIS — J9621 Acute and chronic respiratory failure with hypoxia: Secondary | ICD-10-CM | POA: Diagnosis not present

## 2016-08-02 DIAGNOSIS — J441 Chronic obstructive pulmonary disease with (acute) exacerbation: Secondary | ICD-10-CM | POA: Diagnosis not present

## 2016-08-02 DIAGNOSIS — I2510 Atherosclerotic heart disease of native coronary artery without angina pectoris: Secondary | ICD-10-CM | POA: Diagnosis not present

## 2016-08-02 DIAGNOSIS — J9621 Acute and chronic respiratory failure with hypoxia: Secondary | ICD-10-CM | POA: Diagnosis not present

## 2016-08-02 DIAGNOSIS — F329 Major depressive disorder, single episode, unspecified: Secondary | ICD-10-CM | POA: Diagnosis not present

## 2016-08-02 DIAGNOSIS — D649 Anemia, unspecified: Secondary | ICD-10-CM | POA: Diagnosis not present

## 2016-08-02 DIAGNOSIS — J9622 Acute and chronic respiratory failure with hypercapnia: Secondary | ICD-10-CM | POA: Diagnosis not present

## 2016-08-05 DIAGNOSIS — J441 Chronic obstructive pulmonary disease with (acute) exacerbation: Secondary | ICD-10-CM | POA: Diagnosis not present

## 2016-08-05 DIAGNOSIS — D649 Anemia, unspecified: Secondary | ICD-10-CM | POA: Diagnosis not present

## 2016-08-05 DIAGNOSIS — J9622 Acute and chronic respiratory failure with hypercapnia: Secondary | ICD-10-CM | POA: Diagnosis not present

## 2016-08-05 DIAGNOSIS — F329 Major depressive disorder, single episode, unspecified: Secondary | ICD-10-CM | POA: Diagnosis not present

## 2016-08-05 DIAGNOSIS — I2510 Atherosclerotic heart disease of native coronary artery without angina pectoris: Secondary | ICD-10-CM | POA: Diagnosis not present

## 2016-08-05 DIAGNOSIS — J9621 Acute and chronic respiratory failure with hypoxia: Secondary | ICD-10-CM | POA: Diagnosis not present

## 2016-08-06 ENCOUNTER — Telehealth: Payer: Self-pay | Admitting: *Deleted

## 2016-08-06 DIAGNOSIS — D649 Anemia, unspecified: Secondary | ICD-10-CM | POA: Diagnosis not present

## 2016-08-06 DIAGNOSIS — I2510 Atherosclerotic heart disease of native coronary artery without angina pectoris: Secondary | ICD-10-CM | POA: Diagnosis not present

## 2016-08-06 DIAGNOSIS — J9622 Acute and chronic respiratory failure with hypercapnia: Secondary | ICD-10-CM | POA: Diagnosis not present

## 2016-08-06 DIAGNOSIS — J441 Chronic obstructive pulmonary disease with (acute) exacerbation: Secondary | ICD-10-CM | POA: Diagnosis not present

## 2016-08-06 DIAGNOSIS — F329 Major depressive disorder, single episode, unspecified: Secondary | ICD-10-CM | POA: Diagnosis not present

## 2016-08-06 DIAGNOSIS — J9621 Acute and chronic respiratory failure with hypoxia: Secondary | ICD-10-CM | POA: Diagnosis not present

## 2016-08-06 NOTE — Patient Outreach (Signed)
[facility]) Care Management  08/06/2016  [name] 03/16/1956 [id number]   Care Coordination  [facility] CM consulted when a call received from patient at [facility] office about a discharge letter. THN CM reviewed previous THN CM notes indicating pt is now a resident of a facility and [facility] community case closure noted on 07/31/16. THN CM called listed home number for Ms Ingber and left a voice message to include e this THN Cm mobile number for a return call to answer questions  Plan [facility] CM will answer Ms Brisby questions upon a return call.

## 2016-08-08 DIAGNOSIS — F329 Major depressive disorder, single episode, unspecified: Secondary | ICD-10-CM | POA: Diagnosis not present

## 2016-08-08 DIAGNOSIS — J441 Chronic obstructive pulmonary disease with (acute) exacerbation: Secondary | ICD-10-CM | POA: Diagnosis not present

## 2016-08-08 DIAGNOSIS — J9621 Acute and chronic respiratory failure with hypoxia: Secondary | ICD-10-CM | POA: Diagnosis not present

## 2016-08-08 DIAGNOSIS — J9622 Acute and chronic respiratory failure with hypercapnia: Secondary | ICD-10-CM | POA: Diagnosis not present

## 2016-08-08 DIAGNOSIS — D649 Anemia, unspecified: Secondary | ICD-10-CM | POA: Diagnosis not present

## 2016-08-08 DIAGNOSIS — I2510 Atherosclerotic heart disease of native coronary artery without angina pectoris: Secondary | ICD-10-CM | POA: Diagnosis not present

## 2016-08-09 ENCOUNTER — Telehealth: Payer: Self-pay | Admitting: *Deleted

## 2016-08-09 NOTE — Patient Outreach (Signed)
[place] [facility]) Care Management  08/09/2016  [name] [birth year] [id number]   Care coordination  THN CM reviewed EPIC notes.   THN CM sent in basket message to [facility] CMA, CM THN CM spoke with [name] about review of EPIC chart and scheduled a home visit for next week before 2 pm.  She confirmed that during 2-4 pm she generally take a nap.  She confirmed she had various appointments on next week but agreed to a good appointment time for her. No upcoming appointments noted in EPIC for pt.   Discussed that other follow up calls could be completed after home visit completed.  She states her son works at [employer] and he was aware that she was not in a facility.  She confirms that [facility] to provide care for her   [facility] when CM spoke with [name] who confirms [name] is seen by Tonye Becket, PA  Plans home visit planned for 08/13/16 at 1200 noon  [place]. Lavina Hamman, RN, BSN, [place] Care Management [phone]

## 2016-08-12 DIAGNOSIS — F329 Major depressive disorder, single episode, unspecified: Secondary | ICD-10-CM | POA: Diagnosis not present

## 2016-08-12 DIAGNOSIS — J441 Chronic obstructive pulmonary disease with (acute) exacerbation: Secondary | ICD-10-CM | POA: Diagnosis not present

## 2016-08-12 DIAGNOSIS — I2510 Atherosclerotic heart disease of native coronary artery without angina pectoris: Secondary | ICD-10-CM | POA: Diagnosis not present

## 2016-08-12 DIAGNOSIS — D649 Anemia, unspecified: Secondary | ICD-10-CM | POA: Diagnosis not present

## 2016-08-12 DIAGNOSIS — J9621 Acute and chronic respiratory failure with hypoxia: Secondary | ICD-10-CM | POA: Diagnosis not present

## 2016-08-12 DIAGNOSIS — J9622 Acute and chronic respiratory failure with hypercapnia: Secondary | ICD-10-CM | POA: Diagnosis not present

## 2016-08-13 ENCOUNTER — Other Ambulatory Visit: Payer: Self-pay | Admitting: *Deleted

## 2016-08-13 DIAGNOSIS — L820 Inflamed seborrheic keratosis: Secondary | ICD-10-CM | POA: Diagnosis not present

## 2016-08-13 DIAGNOSIS — Z85828 Personal history of other malignant neoplasm of skin: Secondary | ICD-10-CM | POA: Diagnosis not present

## 2016-08-13 DIAGNOSIS — Z08 Encounter for follow-up examination after completed treatment for malignant neoplasm: Secondary | ICD-10-CM | POA: Diagnosis not present

## 2016-08-13 DIAGNOSIS — D485 Neoplasm of uncertain behavior of skin: Secondary | ICD-10-CM | POA: Diagnosis not present

## 2016-08-13 DIAGNOSIS — D225 Melanocytic nevi of trunk: Secondary | ICD-10-CM | POA: Diagnosis not present

## 2016-08-13 NOTE — Patient Outreach (Signed)
[place] [facility]) Care Management  08/13/2016  [name] 1955/06/20 [id number]   [facility] CM received a call from Letta Median, [place] medical staff to confirm that [name] will re engage with [name]  Plan:  Cynthia Choung will be seen this week for a home visit  [place]. Lavina Hamman, RN, BSN, [place] Care Management [phone]

## 2016-08-14 DIAGNOSIS — J9611 Chronic respiratory failure with hypoxia: Secondary | ICD-10-CM | POA: Diagnosis not present

## 2016-08-14 DIAGNOSIS — I10 Essential (primary) hypertension: Secondary | ICD-10-CM | POA: Diagnosis not present

## 2016-08-14 DIAGNOSIS — J449 Chronic obstructive pulmonary disease, unspecified: Secondary | ICD-10-CM | POA: Diagnosis not present

## 2016-08-14 NOTE — Patient Outreach (Signed)
[facility]) Care Management   08/13/2016  [name] 09-27-1955 [id number]  [name] is an 61 y.o. female  hx of severe COPD, coronary artery disease with history of supraventricular tachyarrhythmias, HLD [name] had been admitted to [facility] skilled nursing facility (snf) for rehab on 05/28/16 after being discharged from the hospital (admission on 05/06/16 for hypoxic and hypercapnic respiratory failure/COPD).  She was seen by [facility] snf facility liaison and referred to [facility] community care management.  Several unsuccessful attempts to contact [name] after her initial [facility] CM home visit was completed.  [name] reached out to [facility] main office after unsuccessful contact attempts and case closure.  This [facility] CM staff re engaged with [name] and reviewed [facility] services.    Subjective:   "My stomach is swollen"   Objective:   BP 122/64 (BP Location: Right Arm, Patient Position: Sitting, Cuff Size: Small)   Pulse (!) 102   Temp 98.9 F (37.2 C)   SpO2 96%   Review of Systems  Constitutional: Positive for malaise/fatigue. Negative for chills, diaphoresis, fever and weight loss.  HENT: Negative.  Negative for congestion, ear discharge, ear pain, hearing loss, nosebleeds, sinus pain, sore throat and tinnitus.   Eyes: Negative.  Negative for blurred vision, double vision, photophobia, pain, discharge and redness.  Respiratory: Positive for shortness of breath. Negative for hemoptysis, sputum production and stridor.   Cardiovascular: Negative for palpitations, orthopnea, claudication and PND.  Gastrointestinal: Positive for abdominal pain. Negative for blood in stool, constipation, diarrhea, heartburn, melena, nausea and vomiting.  Genitourinary: Negative for dysuria, flank pain, frequency, hematuria and urgency.  Musculoskeletal: Negative for back pain, falls, joint pain and neck pain.  Skin: Negative.  Negative for itching and rash.  Neurological: Negative for dizziness,  tingling, tremors, sensory change, speech change, focal weakness, seizures, loss of consciousness, weakness and headaches.  Endo/Heme/Allergies: Negative for environmental allergies and polydipsia. Bruises/bleeds easily.  Psychiatric/Behavioral: Negative for depression, hallucinations, memory loss, substance abuse and suicidal ideas. The patient is nervous/anxious. The patient does not have insomnia.     Physical Exam  Constitutional: She is oriented to person, place, and time. She appears well-developed and well-nourished.  HENT:  Head: Normocephalic and atraumatic.  Neck: Normal range of motion. Neck supple.  GI: She exhibits distension. Bowel sounds are decreased.  Musculoskeletal: Normal range of motion.  Neurological: She is alert and oriented to person, place, and time.  Skin: Skin is warm and dry.  Psychiatric: Her behavior is normal. Judgment and thought content normal. Her mood appears anxious.    Encounter Medications:   Outpatient Encounter Prescriptions as of 08/13/2016  Medication Sig Note  . albuterol (PROAIR HFA) 108 (90 BASE) MCG/ACT inhaler Inhale 2 puffs into the lungs every 6 (six) hours as needed. For shortness of breath   . ALPRAZolam (XANAX) 0.25 MG tablet Take 0.25 mg by mouth 2 (two) times daily as needed for anxiety.   Marland Kitchen aspirin 81 MG tablet Take 81 mg by mouth daily.   Marland Kitchen atorvastatin (LIPITOR) 40 MG tablet Take 1 tablet (40 mg total) by mouth daily. (Patient taking differently: Take 40 mg by mouth every evening. )   . azithromycin (ZITHROMAX) 250 MG tablet Take by mouth daily. 08/13/2016: Taking MW F   . cetirizine (ZYRTEC) 10 MG tablet Take 10 mg by mouth daily.     . clonazePAM (KLONOPIN) 0.5 MG tablet Take 1 tablet (0.5 mg total) by mouth at bedtime as needed for anxiety.  06/19/2016: Has on hand; has not been taking  . diltiazem (CARDIZEM CD) 180 MG 24 hr capsule Take 1 capsule (180 mg total) by mouth daily.   Marland Kitchen escitalopram (LEXAPRO) 20 MG tablet Take 20 mg by  mouth daily.     . fluconazole (DIFLUCAN) 150 MG tablet Take 150 mg by mouth once a week.    Marland Kitchen ipratropium-albuterol (DUONEB) 0.5-2.5 (3) MG/3ML SOLN Take 3 mLs by nebulization 3 (three) times daily. 06/27/2016: Not using it 3 times daily  . montelukast (SINGULAIR) 10 MG tablet Take 1 tablet (10 mg total) by mouth at bedtime.   . Multiple Vitamins-Minerals (MULTIVITAMINS THER. W/MINERALS) TABS Take 1 tablet by mouth daily.     Marland Kitchen oxybutynin (DITROPAN) 5 MG tablet Take 5 mg by mouth daily.  09/08/2013: Received from: External Pharmacy  . roflumilast (DALIRESP) 500 MCG TABS tablet Take 500 mcg by mouth daily.     Marland Kitchen SPIRIVA RESPIMAT 2.5 MCG/ACT AERS Inhale 1 puff into the lungs daily.  11/14/2014: Received from: External Pharmacy  . triamcinolone (NASACORT ALLERGY 24HR) 55 MCG/ACT AERO nasal inhaler Place 2 sprays into the nose daily as needed (FOR ALLERGIES). 06/19/2016: prn  . Fluticasone-Salmeterol (ADVAIR) 250-50 MCG/DOSE AEPB Inhale 1 puff into the lungs 2 (two) times daily.    Marland Kitchen guaiFENesin (MUCINEX) 600 MG 12 hr tablet Take 1 tablet (600 mg total) by mouth 2 (two) times daily.   Marland Kitchen ibuprofen (ADVIL,MOTRIN) 200 MG tablet Take 400 mg by mouth 2 (two) times daily as needed for mild pain or moderate pain.   . predniSONE (DELTASONE) 10 MG tablet Take 1 tablet (10 mg total) by mouth every morning. 4 tablets for 3 days, 3 tablets for 3 days, 2 tablets for 3 days and then 1 tablet (10 mg) daily. (Patient not taking: Reported on 06/19/2016) 08/13/2016: 10 mg maintenance does    No facility-administered encounter medications on file as of 08/13/2016.      Assessment:   [name] was seen at her home.  Her sister was present but left to go grocery shopping.  [name] was noted to be sitting at her dining room table upon CM arrival with oxygen intact [place] at 3 L. She states she has been to a higher oxygen rate previously but is doing better.  She reports no longer with periods of confusion but still "giving out of  breath with short distances and at rest at intervals.  THN CM services were reviewed with Cynthia Deily who confirms her [facility] continues but will soon be ending.  Cynthia Over voiced concern of continuing [facility] services when [facility] services have been concluded.  Cynthia Oberg shared with [facility] CM that she had family members that were local medical providers. Cynthia Splitt reviewed her medical history with the [facility] CM. THN CM answered questions about her previous 07/26/16 CSF test.   Acute medical conditions Cynthia Beitel after blood pressure, pulse and oxygen saturation assessed, informed THN CM she had at intervals had a fever. She checked her temperature with her thermometer and found her temperature to be 98.9 orally. She confirmed she is also easily bruised  When TH CM assessed GI symptoms Cynthia Fantin reports abdominal pain and bowel elimination concerns.  THN CM and Cynthia Ducre discussed options for bowel elimination.  Cynthia Savino discussed that she is scheduled for a CT of her soft tissue of her neck on 07/19/16 and inquired if a CT of her abdomen would be beneficial considering her  voiced abdominal symptoms (distension, elimination).  THN CM discussed that this would be something that could be discussed with Dr Juanetta Gosling.  Cynthia Lippard reports establishing care with Dr Juanetta Gosling (local internal medicine/pulmonologist -a friend) after her discharge home from [place] snf.     Cynthia Merola discussed with this [facility] CM her a past experience in which she awakened from a procedure and had not been started on her CPAP by recovery nursing staff as instructed by the doctor prior to the procedure. She voiced that the nursing staff assessed that she was allergic to contrast dye versus having respiratory symptoms related to not being placed on her CPAP immediately after the procedure as ordered.  Cynthia Milkey voices concern that all future medical providers are now cautious with providing tests that involves contrast  related to this incident and documentation in her chart.  Cynthia Poitra asked that this be shared with Dr Juanetta Gosling and other providers.  Cynthia Lynes was noted with a moment of anxious behavior when she was showing THN her pulse oximeter that captures EKG results and the capture of [facility] CM's values were delayed.  Cynthia Sula voiced her concern for [facility] CM pulse ox values and then stated she had to use the restroom.   Upon Cynthia Querry return CM dicussed the possible reason for the delay in values and she was calmer. Prior to [facility] CM departure of her home, another caregiver arrived to the home.    Chronic medical conditions (severe COPD, coronary artery disease with history of supraventricular tachyarrhythmias, HLD) Cynthia Lachman confirms moments of shortness of breath but with available orders to increase her oxygen as need (3-5 L). Cynthia Pleva other COPD symptoms, CAD, tachyrhythmia and hyperlipidemia are managed with her diet, medicines and rest.  She states she takes naps to conserve her energy daily from "two to four" in the afternoon.  She reports ongoing tests by her pulmonologist to assist with clarifying her respiratory medical concerns.    Plan:  [facility] CM will speak with Dr Juanetta Gosling office staff about Cynthia Frerking voiced concerns about her abdominal symptoms, contrast dye (FYI), interest in CT of abdomen and update Cynthia Anhorn.  Cynthia Wilbur will be seen for home visit in 1-2 weeks and called to follow up on contact with Dr Juanetta Gosling  [facility] CM Care Plan Problem One     Most Recent Value  Care Plan Problem One  Knowledge Deficits related to new plan of care for long term self health management of COPD, abdominal pain and tachycardia  Role Documenting the Problem One  Care Management Coordinator  Care Plan for Problem One  Active  THN Long Term Goal (31-90 days)  Over the next 31 days, patient will verbalize understanding of plan of care for long term self health mangaement of COPD, tachycardia as established by pulmonologist/PCP   THN Long Term Goal Start Date  08/13/16  Interventions for Problem One Long Term Goal  reviewed COPD zone symptoms to report to pcp/pulmonologist, provded [facility] Calendar including list of zone symptoms, consult with pulmonologist/pcp discussed tachycardia  THN CM Short Term Goal #1 (0-30 days)  Over the next 30 days, patient will verbalize understanding of when to call provider for worsening symptoms  THN CM Short Term Goal #1 Start Date  08/13/16  Interventions for Short Term Goal #1  review of yellow and red zone symptoms, provided list of symptoms  THN CM Short Term Goal #2 (0-30 days)  Over the next 30 days, patient will verablize understanding  of plan of care for management of abdominal pain  THN CM Short Term Goal #2 Start Date  08/13/16       Cala Bradford L. Noelle Penner, RN, BSN, CCM [facility] Care Management [phone]

## 2016-08-15 ENCOUNTER — Ambulatory Visit (HOSPITAL_COMMUNITY): Payer: Medicare Other

## 2016-08-15 ENCOUNTER — Other Ambulatory Visit (HOSPITAL_COMMUNITY): Payer: Self-pay | Admitting: Pulmonary Disease

## 2016-08-15 DIAGNOSIS — J9621 Acute and chronic respiratory failure with hypoxia: Secondary | ICD-10-CM | POA: Diagnosis not present

## 2016-08-15 DIAGNOSIS — R109 Unspecified abdominal pain: Secondary | ICD-10-CM

## 2016-08-15 DIAGNOSIS — I2510 Atherosclerotic heart disease of native coronary artery without angina pectoris: Secondary | ICD-10-CM | POA: Diagnosis not present

## 2016-08-15 DIAGNOSIS — J441 Chronic obstructive pulmonary disease with (acute) exacerbation: Secondary | ICD-10-CM | POA: Diagnosis not present

## 2016-08-15 DIAGNOSIS — R609 Edema, unspecified: Secondary | ICD-10-CM

## 2016-08-15 DIAGNOSIS — F329 Major depressive disorder, single episode, unspecified: Secondary | ICD-10-CM | POA: Diagnosis not present

## 2016-08-15 DIAGNOSIS — D649 Anemia, unspecified: Secondary | ICD-10-CM | POA: Diagnosis not present

## 2016-08-15 DIAGNOSIS — J9622 Acute and chronic respiratory failure with hypercapnia: Secondary | ICD-10-CM | POA: Diagnosis not present

## 2016-08-18 DIAGNOSIS — B009 Herpesviral infection, unspecified: Secondary | ICD-10-CM | POA: Diagnosis not present

## 2016-08-18 DIAGNOSIS — D649 Anemia, unspecified: Secondary | ICD-10-CM | POA: Diagnosis not present

## 2016-08-18 DIAGNOSIS — J9622 Acute and chronic respiratory failure with hypercapnia: Secondary | ICD-10-CM | POA: Diagnosis not present

## 2016-08-18 DIAGNOSIS — Z7982 Long term (current) use of aspirin: Secondary | ICD-10-CM | POA: Diagnosis not present

## 2016-08-18 DIAGNOSIS — J9621 Acute and chronic respiratory failure with hypoxia: Secondary | ICD-10-CM | POA: Diagnosis not present

## 2016-08-18 DIAGNOSIS — F419 Anxiety disorder, unspecified: Secondary | ICD-10-CM | POA: Diagnosis not present

## 2016-08-18 DIAGNOSIS — I2510 Atherosclerotic heart disease of native coronary artery without angina pectoris: Secondary | ICD-10-CM | POA: Diagnosis not present

## 2016-08-18 DIAGNOSIS — Z7951 Long term (current) use of inhaled steroids: Secondary | ICD-10-CM | POA: Diagnosis not present

## 2016-08-18 DIAGNOSIS — J441 Chronic obstructive pulmonary disease with (acute) exacerbation: Secondary | ICD-10-CM | POA: Diagnosis not present

## 2016-08-18 DIAGNOSIS — Z7952 Long term (current) use of systemic steroids: Secondary | ICD-10-CM | POA: Diagnosis not present

## 2016-08-18 DIAGNOSIS — Z742 Need for assistance at home and no other household member able to render care: Secondary | ICD-10-CM | POA: Diagnosis not present

## 2016-08-18 DIAGNOSIS — F329 Major depressive disorder, single episode, unspecified: Secondary | ICD-10-CM | POA: Diagnosis not present

## 2016-08-18 DIAGNOSIS — Z9981 Dependence on supplemental oxygen: Secondary | ICD-10-CM | POA: Diagnosis not present

## 2016-08-18 DIAGNOSIS — R7303 Prediabetes: Secondary | ICD-10-CM | POA: Diagnosis not present

## 2016-08-18 DIAGNOSIS — F17210 Nicotine dependence, cigarettes, uncomplicated: Secondary | ICD-10-CM | POA: Diagnosis not present

## 2016-08-19 ENCOUNTER — Ambulatory Visit (HOSPITAL_COMMUNITY)
Admission: RE | Admit: 2016-08-19 | Discharge: 2016-08-19 | Disposition: A | Discharge status: Home / Self Care | Payer: Medicare Other | Source: Ambulatory Visit | Attending: Pulmonary Disease | Admitting: Pulmonary Disease

## 2016-08-19 DIAGNOSIS — R591 Generalized enlarged lymph nodes: Secondary | ICD-10-CM | POA: Insufficient documentation

## 2016-08-19 DIAGNOSIS — J439 Emphysema, unspecified: Secondary | ICD-10-CM | POA: Insufficient documentation

## 2016-08-19 DIAGNOSIS — Z981 Arthrodesis status: Secondary | ICD-10-CM | POA: Diagnosis not present

## 2016-08-19 DIAGNOSIS — R109 Unspecified abdominal pain: Secondary | ICD-10-CM

## 2016-08-19 DIAGNOSIS — I700 Atherosclerosis of aorta: Secondary | ICD-10-CM | POA: Insufficient documentation

## 2016-08-19 DIAGNOSIS — K8020 Calculus of gallbladder without cholecystitis without obstruction: Secondary | ICD-10-CM | POA: Diagnosis not present

## 2016-08-19 DIAGNOSIS — R221 Localized swelling, mass and lump, neck: Secondary | ICD-10-CM | POA: Diagnosis not present

## 2016-08-19 DIAGNOSIS — R609 Edema, unspecified: Secondary | ICD-10-CM

## 2016-08-20 DIAGNOSIS — J441 Chronic obstructive pulmonary disease with (acute) exacerbation: Secondary | ICD-10-CM | POA: Diagnosis not present

## 2016-08-20 DIAGNOSIS — J9622 Acute and chronic respiratory failure with hypercapnia: Secondary | ICD-10-CM | POA: Diagnosis not present

## 2016-08-20 DIAGNOSIS — J9621 Acute and chronic respiratory failure with hypoxia: Secondary | ICD-10-CM | POA: Diagnosis not present

## 2016-08-20 DIAGNOSIS — D649 Anemia, unspecified: Secondary | ICD-10-CM | POA: Diagnosis not present

## 2016-08-20 DIAGNOSIS — F329 Major depressive disorder, single episode, unspecified: Secondary | ICD-10-CM | POA: Diagnosis not present

## 2016-08-20 DIAGNOSIS — I2510 Atherosclerotic heart disease of native coronary artery without angina pectoris: Secondary | ICD-10-CM | POA: Diagnosis not present

## 2016-08-27 ENCOUNTER — Telehealth: Payer: Self-pay | Admitting: *Deleted

## 2016-08-27 ENCOUNTER — Other Ambulatory Visit: Payer: Self-pay | Admitting: *Deleted

## 2016-08-27 DIAGNOSIS — D649 Anemia, unspecified: Secondary | ICD-10-CM | POA: Diagnosis not present

## 2016-08-27 DIAGNOSIS — J441 Chronic obstructive pulmonary disease with (acute) exacerbation: Secondary | ICD-10-CM | POA: Diagnosis not present

## 2016-08-27 DIAGNOSIS — J9622 Acute and chronic respiratory failure with hypercapnia: Secondary | ICD-10-CM | POA: Diagnosis not present

## 2016-08-27 DIAGNOSIS — I2510 Atherosclerotic heart disease of native coronary artery without angina pectoris: Secondary | ICD-10-CM | POA: Diagnosis not present

## 2016-08-27 DIAGNOSIS — F329 Major depressive disorder, single episode, unspecified: Secondary | ICD-10-CM | POA: Diagnosis not present

## 2016-08-27 DIAGNOSIS — J9621 Acute and chronic respiratory failure with hypoxia: Secondary | ICD-10-CM | POA: Diagnosis not present

## 2016-08-27 NOTE — Patient Outreach (Signed)
IMONI KOHEN 07/24/1955 [id number]   Care Coordination  THN CM received a return call from Mrs Gates at [phone]  She reports she had been washing her hair and apologized for being "a little out of breath"  Reports shampooing her hair is "tiring"   Mrs Salido confirms she has been offered further home health services "They have re up my home health services again I think possibly because I continue to have a fever."  She reports having spoken with Dr Hilma Favors about her fever and all possible tests have been completed. Reports Dr Luan Pulling who is completing most of her primary care at this time continues to assist with attempting to find an origin..  She confirms she will be seeing Dr Luan Pulling in "two to three weeks" She asked about continued [facility] CM services and CM discussed thereTHN CM inquired about questions and concerns about her completed CT scans.  She confirmed she has reviewed them with Dr Luan Pulling especially her questions/concerns about the nodule and gallstone.  Mrs Warr states she is aware of her diverticulosis without diverticulitis and she has spoken with her daughter, Dr Hilma Favors diet related to diverticulosis.  Mrs Doyle voice most of her concern from the CT scans was related to her spine/disc changes   Mrs Mcmartin states she has increased her naps to twice a day.  Mrs Carreto reports continuing to have a headache with pain in neck.  THN CM discussed sharing her CT report information with her [facility] PT who may be able to provide an intervention to assist  Plans:  [facility] CM will see Mrs Placke for a follow up home visit this week   Joelene Millin L. Lavina Hamman, RN, BSN, [place] Care Management [phone]

## 2016-08-27 NOTE — Patient Outreach (Signed)
[facility]) Care Management  08/27/2016  [name] 25-Jul-1955 [id number]   [facility] CM left voice message for [name] including [facility] CM mobile number for a return call.  THN CM attempting to follow up with [name] after Her CT scans and seeing her in Dr Luan [name] office on 08/15/16.  When [facility] went to speak with Dr Luan [name] about [name] voiced concerns about her abdomen and concerns that medical providers voiced concern with providing CTs/imaging with contrast related to her past experience after a procedure.  [name] had voiced concern that she may be able to have imaging with contrast.   [name] was present with her sister at Dr Luan [name]' office to be seen when [facility] CM arrived on 08/15/16 and Dr Luan [name] was able to assist with adding a CT of abdomen to the orders for a CT of soft tissue of the neck.  THN CM spoke with [name] and her sister prior to her departure from the office and after speaking with Dr Luan [name] and his staff about her concerns.     Plans:  THN CM Left a voice message for [name] and will speak with her if she returns a call or try another telephone call to her this week  Joelene Millin L. Lavina Hamman, RN, BSN, [place] Care Management [phone]

## 2016-08-30 ENCOUNTER — Other Ambulatory Visit: Payer: Self-pay | Admitting: *Deleted

## 2016-08-30 NOTE — Patient Outreach (Signed)
[facility]) Care Management   08/30/2016  [name] 10-Sep-1955 [id number]  [name] is an 61 y.o. female with hx of severe COPD, coronary artery disease with history of supraventricular tachyarrhythmias, HLD [name] had been admitted to [facility] skilled nursing facility (snf) for rehab on 05/28/16 after being discharged from the hospital (admission on 05/06/16 for hypoxic and hypercapnic respiratory failure/COPD).  She was seen by [facility] snf facility liaison and referred to [facility] community care management.  Several unsuccessful attempts to contact [name] after her initial [facility] CM home visit was completed.  [name] reached out to [facility] main office after unsuccessful contact attempts and case closure.  This [facility] CM staff re engaged with [name] and reviewed [facility] services.     Subjective:  "idiopathy tremor all over"  Objective:   BP 126/60 (BP Location: Right Arm, Patient Position: Sitting, Cuff Size: Normal)   Pulse 87   Temp 99.3 F (37.4 C)   Resp (!) 22   SpO2 97%   Review of Systems  Constitutional: Positive for fever. Negative for chills, diaphoresis, malaise/fatigue and weight loss.       Still have moments of complete heat spells   HENT: Positive for congestion. Negative for ear discharge, ear pain, hearing loss, nosebleeds, sinus pain, sore throat and tinnitus.   Eyes: Negative.  Negative for blurred vision, double vision, photophobia, pain, discharge and redness.  Respiratory: Positive for sputum production, shortness of breath and wheezing. Negative for cough, hemoptysis and stridor.   Cardiovascular: Positive for palpitations. Negative for chest pain, claudication, leg swelling and PND.  Gastrointestinal: Negative for abdominal pain, blood in stool, constipation, diarrhea, heartburn, melena, nausea and vomiting.  Genitourinary: Positive for frequency and urgency. Negative for dysuria, flank pain and hematuria.  Musculoskeletal: Positive for joint  pain. Negative for back pain, falls, myalgias and neck pain.  Skin: Negative.  Negative for itching and rash.  Neurological: Positive for dizziness and headaches. Negative for tingling, tremors, sensory change, speech change, focal weakness, seizures, loss of consciousness and weakness.  Endo/Heme/Allergies: Negative for environmental allergies and polydipsia. Bruises/bleeds easily.  Psychiatric/Behavioral: Negative for depression, hallucinations, memory loss, substance abuse and suicidal ideas. The patient is not nervous/anxious and does not have insomnia.     Physical Exam  Constitutional: She is oriented to person, place, and time. She appears well-developed and well-nourished.  HENT:  Head: Normocephalic and atraumatic.  Eyes: Conjunctivae are normal. Pupils are equal, round, and reactive to light.  Neck: Normal range of motion. Neck supple.  Cardiovascular: Normal rate, regular rhythm, normal heart sounds and intact distal pulses.   Respiratory: Effort normal and breath sounds normal.  GI: Soft. Bowel sounds are normal.  Musculoskeletal: Normal range of motion.  Neurological: She is alert and oriented to person, place, and time.  Skin: Skin is warm and dry.  Psychiatric: She has a normal mood and affect. Her behavior is normal. Judgment and thought content normal.    Encounter Medications:   Outpatient Encounter Prescriptions as of 08/30/2016  Medication Sig Note  . albuterol (PROAIR HFA) 108 (90 BASE) MCG/ACT inhaler Inhale 2 puffs into the lungs every 6 (six) hours as needed. For shortness of breath   . ALPRAZolam (XANAX) 0.25 MG tablet Take 0.25 mg by mouth 2 (two) times daily as needed for anxiety.   Marland Kitchen aspirin 81 MG tablet Take 81 mg by mouth daily.   Marland Kitchen atorvastatin (LIPITOR) 40 MG tablet Take 1 tablet (40 mg total)  by mouth daily. (Patient taking differently: Take 40 mg by mouth every evening. )   . azithromycin (ZITHROMAX) 250 MG tablet Take by mouth daily. 08/13/2016: Taking MW  F   . cetirizine (ZYRTEC) 10 MG tablet Take 10 mg by mouth daily.     . clonazePAM (KLONOPIN) 0.5 MG tablet Take 1 tablet (0.5 mg total) by mouth at bedtime as needed for anxiety. 06/19/2016: Has on hand; has not been taking  . diltiazem (CARDIZEM CD) 180 MG 24 hr capsule Take 1 capsule (180 mg total) by mouth daily.   Marland Kitchen escitalopram (LEXAPRO) 20 MG tablet Take 20 mg by mouth daily.     . fluconazole (DIFLUCAN) 150 MG tablet Take 150 mg by mouth once a week.    . Fluticasone-Salmeterol (ADVAIR) 250-50 MCG/DOSE AEPB Inhale 1 puff into the lungs 2 (two) times daily.    Marland Kitchen guaiFENesin (MUCINEX) 600 MG 12 hr tablet Take 1 tablet (600 mg total) by mouth 2 (two) times daily.   Marland Kitchen ibuprofen (ADVIL,MOTRIN) 200 MG tablet Take 400 mg by mouth 2 (two) times daily as needed for mild pain or moderate pain.   Marland Kitchen ipratropium-albuterol (DUONEB) 0.5-2.5 (3) MG/3ML SOLN Take 3 mLs by nebulization 3 (three) times daily. 06/27/2016: Not using it 3 times daily  . montelukast (SINGULAIR) 10 MG tablet Take 1 tablet (10 mg total) by mouth at bedtime.   . Multiple Vitamins-Minerals (MULTIVITAMINS THER. W/MINERALS) TABS Take 1 tablet by mouth daily.     Marland Kitchen oxybutynin (DITROPAN) 5 MG tablet Take 5 mg by mouth daily.  09/08/2013: Received from: External Pharmacy  . predniSONE (DELTASONE) 10 MG tablet Take 1 tablet (10 mg total) by mouth every morning. 4 tablets for 3 days, 3 tablets for 3 days, 2 tablets for 3 days and then 1 tablet (10 mg) daily. (Patient not taking: Reported on 06/19/2016) 08/13/2016: 10 mg maintenance does   . roflumilast (DALIRESP) 500 MCG TABS tablet Take 500 mcg by mouth daily.     Marland Kitchen SPIRIVA RESPIMAT 2.5 MCG/ACT AERS Inhale 1 puff into the lungs daily.  11/14/2014: Received from: External Pharmacy  . triamcinolone (NASACORT ALLERGY 24HR) 55 MCG/ACT AERO nasal inhaler Place 2 sprays into the nose daily as needed (FOR ALLERGIES). 06/19/2016: prn   No facility-administered encounter medications on file as of  08/30/2016.     Functional Status:   In your present state of health, do you have any difficulty performing the following activities: 05/14/2016  Hearing? N  Vision? N  Difficulty concentrating or making decisions? N  Walking or climbing stairs? N  Dressing or bathing? N  Doing errands, shopping? N  Some recent data might be hidden    Fall/Depression Screening:    PHQ 2/9 Scores 01/16/2012  PHQ - 2 Score 1    Assessment:    Cynthia Naslund was seen at her home. Today she as usual pleasantly greeted [facility] CM at her door  She is home alone with her canine companion She and CM consult at her kitchen table with her oxygen intact [place] at 3 L. She states she has been to a higher oxygen rate previously but is doing better.  She reports no longer with periods of confusion but still "giving out of breath with short distances and at rest at intervals.  [facility] CM services were reviewed with Cynthia Steyer who confirms her [facility] continues  Cynthia Brittin is a very strong advocate for herself and does well with coordinating her care along with  help of her family members who work in the [employer].    Acute medical conditions Cynthia Zellmann continues to report being followed by Maralyn Sago [facility] nurse for a low grade fever of an unknown origin She confirmed she is also easily bruised and has new bruise on right hand and left forearm  [facility] CM assessed GI symptoms Cynthia Wickson states she is doing better Cynthia Pranke continues to see Dr Juanetta Gosling for her pulmonary concerns (local internal medicine/pulmonologist -a friend) after her discharge home from [facility].  She states he is completing various tests to attempt to determine her chronic respiratory diagnosis and treatment plan.     Chronic medical conditions (severe COPD, coronary artery disease with history of supraventricular tachyarrhythmias, HLD)  Cynthia Hylton reports palpations at intervals and moments of increase heat of body temperature.  [facility] CM inquired about her hypothalamus and we discussed her CT of the head results that does not discuss concerns with her hypothalamus.  Her Palpations are managed with Cardizem.  Cynthia Schwenker confirms moments of shortness of breath but with available orders to increase her oxygen as need (3-5 L). Cynthia Nadon other COPD symptoms, CAD, tachyrhythmia and hyperlipidemia are managed with her diet, medicines and rest.  She states she takes naps to conserve her energy daily from "two to four" in the afternoon.  She reports ongoing tests by her pulmonologist to assist with clarifying her respiratory medical concerns.   Plan:  [facility] CM will follow up with Cynthia Dhawan and complete a home visit in 1-2 weeks  [facility] CM Care Plan Problem One     Most Recent Value  Care Plan Problem One  Knowledge Deficits related to new plan of care for long term self health management of COPD, abdominal pain and tachycardia  Role Documenting the Problem One  Care Management Coordinator  Care Plan for Problem One  Active  [facility] Long Term Goal (31-90 days)  Over the next 31 days, patient will verbalize understanding of plan of care for long term self health mangaement of COPD, tachycardia as established by pulmonologist/PCP  [facility] Long Term Goal Start Date  08/13/16  Interventions for Problem One Long Term Goal  reviewed COPD zone symptoms to report to pcp/pulmonologist, provded [facility] Calendar including list of zone symptoms, consult with pulmonologist/pcp discussed tachycardia  [facility] CM Short Term Goal #1 (0-30 days)  Over the next 30 days, patient will verbalize understanding of when to call provider for worsening symptoms  [facility] CM Short Term Goal #1 Start Date  08/13/16  Interventions for Short Term Goal #1  review of yellow and red zone symptoms, provided list of symptoms  [facility] CM Short Term Goal #2 (0-30 days)  Over the next 30 days, patient will verablize understanding of plan of care for management of abdominal pain  [facility] CM Short Term Goal  #2 Start Date  08/13/16  Interventions for Short Term Goal #2  assess symptoms, review  treatment plan options, re evaluate for improve,ments       Kimberly L. Noelle Penner, RN, BSN, CCM [facility] Care Management [phone]

## 2016-09-03 DIAGNOSIS — D649 Anemia, unspecified: Secondary | ICD-10-CM | POA: Diagnosis not present

## 2016-09-03 DIAGNOSIS — J9622 Acute and chronic respiratory failure with hypercapnia: Secondary | ICD-10-CM | POA: Diagnosis not present

## 2016-09-03 DIAGNOSIS — I2510 Atherosclerotic heart disease of native coronary artery without angina pectoris: Secondary | ICD-10-CM | POA: Diagnosis not present

## 2016-09-03 DIAGNOSIS — J9621 Acute and chronic respiratory failure with hypoxia: Secondary | ICD-10-CM | POA: Diagnosis not present

## 2016-09-03 DIAGNOSIS — J441 Chronic obstructive pulmonary disease with (acute) exacerbation: Secondary | ICD-10-CM | POA: Diagnosis not present

## 2016-09-03 DIAGNOSIS — F329 Major depressive disorder, single episode, unspecified: Secondary | ICD-10-CM | POA: Diagnosis not present

## 2016-09-04 NOTE — Progress Notes (Signed)
Cardiology Office Note  Date: 09/05/2016   ID: LAURIANN [name], DOB 08-28-1955, MRN [id number]  PCP: Cynthia Maudlin, MD  Primary Cardiologist: Cynthia Dell, MD   Chief Complaint  Patient presents with  . History of SVT    History of Present Illness: [name] is a 61 y.o. female last seen in October 2017. She presents for a routine follow-up visit. She is now following with Dr. Juanetta Gosling for management of her chronic lung disease. States that she had a hospital stays since I saw her followed by rehabilitation related to a severe viral infection. Also now undergoing a workup for apparent fever of unknown origin.  She has had intermittent palpitations, tends to be mainly at nighttime. Sometimes experiences chest pain when she has palpitations. She reports compliance with Cardizem CD at 180 mg daily. We discussed keeping the dose stable in light of low normal blood pressure.  Past Medical History:  Diagnosis Date  . COPD (chronic obstructive pulmonary disease) (HCC)    [facility] oxygen at [facility]  . Diverticulosis   . History of cardiac catheterization    No significant CAD 2005  . Hyperlipidemia   . Hypoglycemia   . IBS (irritable bowel syndrome)   . SVT (supraventricular tachycardia) (HCC)     Past Surgical History:  Procedure Laterality Date  . BACK SURGERY    . COLONOSCOPY  03/22/2011   Procedure: COLONOSCOPY;  Surgeon: Malissa Hippo, MD;  Location: [facility];  Service: Endoscopy;  Laterality: N/A;  . SPINAL FUSION      Current Outpatient Prescriptions  Medication Sig Dispense Refill  . albuterol (PROAIR HFA) 108 (90 BASE) MCG/ACT inhaler Inhale 2 puffs into the lungs every 6 (six) hours as needed. For shortness of breath    . ALPRAZolam (XANAX) 0.25 MG tablet Take 0.25 mg by mouth 2 (two) times daily as needed for anxiety.    Marland Kitchen aspirin 81 MG tablet Take 81 mg by mouth daily.    Marland Kitchen atorvastatin (LIPITOR) 40 MG tablet Take 1 tablet (40 mg total) by mouth daily.  (Patient taking differently: Take 40 mg by mouth every evening. ) 90 tablet 3  . azithromycin (ZITHROMAX) 250 MG tablet Take by mouth daily.    . cetirizine (ZYRTEC) 10 MG tablet Take 10 mg by mouth daily.      . clonazePAM (KLONOPIN) 0.5 MG tablet Take 1 tablet (0.5 mg total) by mouth at bedtime as needed for anxiety. 10 tablet 0  . diltiazem (CARDIZEM CD) 180 MG 24 hr capsule Take 1 capsule (180 mg total) by mouth daily. 90 capsule 3  . escitalopram (LEXAPRO) 20 MG tablet Take 20 mg by mouth daily.      . fluconazole (DIFLUCAN) 150 MG tablet Take 150 mg by mouth once a week.     . Fluticasone-Salmeterol (ADVAIR) 250-50 MCG/DOSE AEPB Inhale into the lungs 2 (two) times daily.     Marland Kitchen ibuprofen (ADVIL,MOTRIN) 200 MG tablet Take 400 mg by mouth 2 (two) times daily as needed for mild pain or moderate pain.    Marland Kitchen ipratropium-albuterol (DUONEB) 0.5-2.5 (3) MG/3ML SOLN Take 3 mLs by nebulization 3 (three) times daily. 360 mL 1  . montelukast (SINGULAIR) 10 MG tablet Take 1 tablet (10 mg total) by mouth at bedtime. 30 tablet 1  . Multiple Vitamins-Minerals (MULTIVITAMINS THER. W/MINERALS) TABS Take 1 tablet by mouth daily.      Marland Kitchen oxybutynin (DITROPAN) 5 MG tablet Take 5 mg by mouth daily.     Marland Kitchen  predniSONE (DELTASONE) 10 MG tablet Take 1 tablet (10 mg total) by mouth every morning. 4 tablets for 3 days, 3 tablets for 3 days, 2 tablets for 3 days and then 1 tablet (10 mg) daily.    . roflumilast (DALIRESP) 500 MCG TABS tablet Take 500 mcg by mouth daily.      Marland Kitchen SPIRIVA RESPIMAT 2.5 MCG/ACT AERS Inhale 1 puff into the lungs daily.     Marland Kitchen triamcinolone (NASACORT ALLERGY 24HR) 55 MCG/ACT AERO nasal inhaler Place 2 sprays into the nose daily as needed (FOR ALLERGIES).    Marland Kitchen guaiFENesin (MUCINEX) 600 MG 12 hr tablet Take 1 tablet (600 mg total) by mouth 2 (two) times daily. (Patient not taking: Reported on 09/05/2016) 20 tablet 0   No current facility-administered medications for this visit.    Allergies:  Iohexol;  Chantix [varenicline]; Ketek [telithromycin]; and Penicillins   Social History: The patient  reports that she quit smoking about 16 months ago. Her smoking use included Cigarettes. She has a 40.00 pack-year smoking history. She has never used smokeless tobacco. She reports that she drinks alcohol. She reports that she does not use drugs.   ROS:  Please see the history of present illness. Otherwise, complete review of systems is positive for chronic dyspnea on exertion, uses oxygen supplementation.  All other systems are reviewed and negative.   Physical Exam: VS:  BP 106/62   Pulse (!) 101   Ht 5\' 3"  (1.6 m)   Wt 125 lb (56.7 kg)   SpO2 96%   BMI 22.14 kg/m , BMI Body mass index is 22.14 kg/m.  Wt Readings from Last 3 Encounters:  09/05/16 125 lb (56.7 kg)  05/26/16 114 lb 6.7 oz (51.9 kg)  02/29/16 119 lb (54 kg)    General: Wearing oxygen via nasal cannula. No distress. HEENT: Conjunctiva and lids normal, oropharynx clear. Neck: Supple, no elevated JVP or carotid bruits, no thyromegaly. Lungs: Decreased breath sounds, no wheezing, nonlabored breathing at rest. Cardiac: Distant regular heart sounds. No S3 or significant systolic murmur, no pericardial rub. Abdomen: Soft, nontender, bowel sounds present, no guarding or rebound. Extremities: No pitting edema, distal pulses 2+.  ECG: I personally reviewed the tracing from 05/21/2016 which showed sinus tachycardia.  Recent Labwork: 05/13/2016: ALT 19; AST 23; B Natriuretic Peptide 67.0; TSH 1.140 05/27/2016: BUN 12; Creatinine, Ser 0.53; Hemoglobin 11.5; Magnesium 1.9; Platelets 272; Sodium 139 05/28/2016: Potassium 3.9   Other Studies Reviewed Today:  Echocardiogram 06/26/2015: Study Conclusions  - Left ventricle: The cavity size was normal. Wall thickness was   normal. Systolic function was normal. The estimated ejection   fraction was in the range of 60% to 65%. Wall motion was normal;   there were no regional wall motion  abnormalities. Doppler   parameters are consistent with abnormal left ventricular   relaxation (grade 1 diastolic dysfunction). - Left atrium: The atrium was mildly dilated.  Holter monitor 06/26/2015: 24-hour Holter Monitor. Heart rate ranged from 70 bpm up to 137 bpm. Sinus rhythm and sinus tachycardia were present. Rare PACs and PVCs were noted. There were no sustained arrhythmias. No pauses.  Assessment and Plan:  1. History of PSVT. She reports intermittent palpitations but no obvious prolonged rapid tachycardia. We plan to continue Cardizem CD 180 mg daily for now. She does have high resting heart rate in light of her chronic lung disease. No change noted today. I reviewed her ECG from January.  2. Severe, oxygen dependent COPD. She is following with  Dr. Juanetta Gosling.  Current medicines were reviewed with the patient today.  Disposition: Follow-up in 6 months.  Signed, Jonelle Sidle, MD, [facility] 09/05/2016 10:55 AM    [facility] at [facility] [address] [place], [place] [address] Phone: [phone]; Fax: [phone]

## 2016-09-05 ENCOUNTER — Ambulatory Visit (INDEPENDENT_AMBULATORY_CARE_PROVIDER_SITE_OTHER): Payer: Medicare Other | Admitting: Cardiology

## 2016-09-05 ENCOUNTER — Encounter: Payer: Self-pay | Admitting: Cardiology

## 2016-09-05 VITALS — BP 106/62 | HR 101 | Ht 63.0 in | Wt 125.0 lb

## 2016-09-05 DIAGNOSIS — J449 Chronic obstructive pulmonary disease, unspecified: Secondary | ICD-10-CM

## 2016-09-05 DIAGNOSIS — I471 Supraventricular tachycardia: Secondary | ICD-10-CM | POA: Diagnosis not present

## 2016-09-05 NOTE — Patient Instructions (Signed)
Your physician wants you to follow-up in: 6 months You will receive a reminder letter in the mail two months in advance. If you don't receive a letter, please call our office to schedule the follow-up appointment.    Your physician recommends that you continue on your current medications as directed. Please refer to the Current Medication list given to you today.    If you need a refill on your cardiac medications before your next appointment, please call your pharmacy.     Thank you for choosing [facility] !        

## 2016-09-11 ENCOUNTER — Other Ambulatory Visit (HOSPITAL_COMMUNITY)
Admission: AD | Admit: 2016-09-11 | Discharge: 2016-09-11 | Disposition: A | Discharge status: Home / Self Care | Payer: Medicare Other | Source: Skilled Nursing Facility | Attending: Pulmonary Disease | Admitting: Pulmonary Disease

## 2016-09-11 DIAGNOSIS — J9622 Acute and chronic respiratory failure with hypercapnia: Secondary | ICD-10-CM | POA: Diagnosis not present

## 2016-09-11 DIAGNOSIS — J441 Chronic obstructive pulmonary disease with (acute) exacerbation: Secondary | ICD-10-CM | POA: Diagnosis not present

## 2016-09-11 DIAGNOSIS — R7303 Prediabetes: Secondary | ICD-10-CM | POA: Diagnosis not present

## 2016-09-11 DIAGNOSIS — F329 Major depressive disorder, single episode, unspecified: Secondary | ICD-10-CM | POA: Diagnosis not present

## 2016-09-11 DIAGNOSIS — I2510 Atherosclerotic heart disease of native coronary artery without angina pectoris: Secondary | ICD-10-CM | POA: Diagnosis not present

## 2016-09-11 DIAGNOSIS — D649 Anemia, unspecified: Secondary | ICD-10-CM | POA: Diagnosis not present

## 2016-09-11 DIAGNOSIS — J9621 Acute and chronic respiratory failure with hypoxia: Secondary | ICD-10-CM | POA: Diagnosis not present

## 2016-09-11 LAB — COMPREHENSIVE METABOLIC PANEL
ALBUMIN: 3.8 g/dL (ref 3.5–5.0)
ALT: 21 U/L (ref 14–54)
AST: 24 U/L (ref 15–41)
Alkaline Phosphatase: 100 U/L (ref 38–126)
Anion gap: 7 (ref 5–15)
BUN: 17 mg/dL (ref 6–20)
CHLORIDE: 97 mmol/L — AB (ref 101–111)
CO2: 35 mmol/L — AB (ref 22–32)
CREATININE: 0.79 mg/dL (ref 0.44–1.00)
Calcium: 8.8 mg/dL — ABNORMAL LOW (ref 8.9–10.3)
GFR calc Af Amer: >60 mL/min (ref >60)
GLUCOSE: 92 mg/dL (ref 65–99)
Potassium: 3.8 mmol/L (ref 3.5–5.1)
SODIUM: 139 mmol/L (ref 135–145)
Total Bilirubin: 0.3 mg/dL (ref 0.3–1.2)
Total Protein: 6.7 g/dL (ref 6.5–8.1)

## 2016-09-11 LAB — CBC WITH DIFFERENTIAL/PLATELET
BASOS PCT: 0 %
Basophils Absolute: 0 10*3/uL (ref 0.0–0.1)
EOS ABS: 0.2 10*3/uL (ref 0.0–0.7)
Eosinophils Relative: 2 %
HCT: 39.5 % (ref 36.0–46.0)
HEMOGLOBIN: 12.7 g/dL (ref 12.0–15.0)
LYMPHS ABS: 3.2 10*3/uL (ref 0.7–4.0)
Lymphocytes Relative: 27 %
MCH: 32.5 pg (ref 26.0–34.0)
MCHC: 32.2 g/dL (ref 30.0–36.0)
MCV: 101 fL — ABNORMAL HIGH (ref 78.0–100.0)
MONOS PCT: 12 %
Monocytes Absolute: 1.4 10*3/uL — ABNORMAL HIGH (ref 0.1–1.0)
NEUTROS PCT: 59 %
Neutro Abs: 7.3 10*3/uL (ref 1.7–7.7)
Platelets: 334 10*3/uL (ref 150–400)
RBC: 3.91 MIL/uL (ref 3.87–5.11)
RDW: 12.2 % (ref 11.5–15.5)
WBC: 12.2 10*3/uL — ABNORMAL HIGH (ref 4.0–10.5)

## 2016-09-11 LAB — TSH: TSH: 2.469 u[IU]/mL (ref 0.350–4.500)

## 2016-09-12 LAB — HIV ANTIBODY (ROUTINE TESTING W REFLEX): HIV Screen 4th Generation wRfx: NONREACTIVE

## 2016-09-12 LAB — B. BURGDORFI ANTIBODIES: B burgdorferi Ab IgG+IgM: <0.91 {ISR} (ref 0.00–0.90)

## 2016-09-12 LAB — EPSTEIN-BARR VIRUS NUCLEAR ANTIGEN ANTIBODY, IGG

## 2016-09-16 DIAGNOSIS — J9611 Chronic respiratory failure with hypoxia: Secondary | ICD-10-CM | POA: Diagnosis not present

## 2016-09-16 DIAGNOSIS — R502 Drug induced fever: Secondary | ICD-10-CM | POA: Diagnosis not present

## 2016-09-16 DIAGNOSIS — I10 Essential (primary) hypertension: Secondary | ICD-10-CM | POA: Diagnosis not present

## 2016-09-16 DIAGNOSIS — J449 Chronic obstructive pulmonary disease, unspecified: Secondary | ICD-10-CM | POA: Diagnosis not present

## 2016-09-17 ENCOUNTER — Other Ambulatory Visit: Payer: Self-pay | Admitting: *Deleted

## 2016-09-17 DIAGNOSIS — D649 Anemia, unspecified: Secondary | ICD-10-CM | POA: Diagnosis not present

## 2016-09-17 DIAGNOSIS — I2510 Atherosclerotic heart disease of native coronary artery without angina pectoris: Secondary | ICD-10-CM | POA: Diagnosis not present

## 2016-09-17 DIAGNOSIS — J9621 Acute and chronic respiratory failure with hypoxia: Secondary | ICD-10-CM | POA: Diagnosis not present

## 2016-09-17 DIAGNOSIS — J441 Chronic obstructive pulmonary disease with (acute) exacerbation: Secondary | ICD-10-CM | POA: Diagnosis not present

## 2016-09-17 DIAGNOSIS — F329 Major depressive disorder, single episode, unspecified: Secondary | ICD-10-CM | POA: Diagnosis not present

## 2016-09-17 DIAGNOSIS — J9622 Acute and chronic respiratory failure with hypercapnia: Secondary | ICD-10-CM | POA: Diagnosis not present

## 2016-09-17 NOTE — Patient Outreach (Signed)
[facility]) Care Management   09/17/16  AMITA QUARLES 1955/10/28 [id number]  TOIA CHATELAIN is an 61 y.o. female with hx of severe COPD, coronary artery disease with history of supraventricular tachyarrhythmias, HLD Mrs Wickersham had been admitted to [facility] skilled nursing facility (snf) for rehab on 05/28/16 after being discharged from the hospital (admission on 05/06/16 for hypoxic and hypercapnic respiratory failure/COPD). She was seen by [facility] snffacility liaison and referred to [facility] community care management. Several unsuccessful attempts to contact Mrs Olinski after her initial [facility] CM home visit was completed. Mrs Shiverdecker reached out to [facility] main office after unsuccessful contact attempts and case closure. This [facility] CM staff re engaged with Mrs Noblitt and reviewed [facility] services. [facility] CM is seeing Mrs Philippi today for a [facility] CM home follow up visit She continues to have a low grade temperature and is now being seen for pcp and pulmonology services by Dr Juanetta Gosling versus Dr Sherwood Gambler    Subjective:  " I have been more short of breath than usually" "I told doctor Juanetta Gosling." " He is scheduling me to see an infectious disease doctor"   Objective:   BP 116/68 (BP Location: Right Arm, Patient Position: Sitting, Cuff Size: Large)   Pulse 93   SpO2 96%   Review of Systems  Constitutional: Positive for fever. Negative for chills, diaphoresis, malaise/fatigue and weight loss.  HENT: Negative for congestion, ear discharge, ear pain, hearing loss, nosebleeds, sinus pain, sore throat and tinnitus.   Eyes: Negative for blurred vision, double vision, photophobia, pain, discharge and redness.  Respiratory: Positive for shortness of breath. Negative for hemoptysis, sputum production, wheezing and stridor.        Denied dyphaagia NO pills being stuck   Cardiovascular: Positive for chest pain. Negative for palpitations, orthopnea, claudication, leg swelling and PND.       Last pm chest pain from bbq  vienna sausage at bedtime  Gastrointestinal: Negative for abdominal pain, blood in stool, constipation, diarrhea, heartburn, melena, nausea and vomiting.  Genitourinary: Negative for dysuria, flank pain, frequency, hematuria and urgency.  Musculoskeletal: Positive for back pain. Negative for falls, joint pain, myalgias and neck pain.  Skin: Negative for itching and rash.  Neurological: Positive for dizziness and headaches. Negative for tingling, tremors, sensory change, speech change, focal weakness, seizures, loss of consciousness and weakness.       Headaches less frequent  Endo/Heme/Allergies: Negative for environmental allergies and polydipsia. Bruises/bleeds easily.  Psychiatric/Behavioral: Negative for depression, hallucinations, memory loss, substance abuse and suicidal ideas. The patient is not nervous/anxious and does not have insomnia.     Physical Exam  Constitutional: She is oriented to person, place, and time. She appears well-developed and well-nourished.  HENT:  Head: Normocephalic and atraumatic.  Right Ear: External ear normal.  Left Ear: External ear normal.  Nose: Nose normal.  Mouth/Throat: Oropharynx is clear and moist.  Eyes: Conjunctivae and EOM are normal. Pupils are equal, round, and reactive to light.  Neck: Normal range of motion. Neck supple.  Cardiovascular: Normal rate, regular rhythm, normal heart sounds and intact distal pulses.   Respiratory: Effort normal and breath sounds normal.  GI: Soft. Bowel sounds are normal.  Musculoskeletal: Normal range of motion.  Neurological: She is alert and oriented to person, place, and time.  Skin: Skin is warm and dry.  Psychiatric: She has a normal mood and affect. Her behavior is normal. Judgment and thought content normal.    Encounter Medications:   Outpatient Encounter  Prescriptions as of 09/17/2016  Medication Sig Note  . albuterol (PROAIR HFA) 108 (90 BASE) MCG/ACT inhaler Inhale 2 puffs into the lungs every 6  (six) hours as needed. For shortness of breath   . ALPRAZolam (XANAX) 0.25 MG tablet Take 0.25 mg by mouth 2 (two) times daily as needed for anxiety.   Marland Kitchen aspirin 81 MG tablet Take 81 mg by mouth daily.   Marland Kitchen atorvastatin (LIPITOR) 40 MG tablet Take 1 tablet (40 mg total) by mouth daily. (Patient taking differently: Take 40 mg by mouth every evening. )   . azithromycin (ZITHROMAX) 250 MG tablet Take by mouth daily. 08/13/2016: Taking MW F   . cetirizine (ZYRTEC) 10 MG tablet Take 10 mg by mouth daily.     . clonazePAM (KLONOPIN) 0.5 MG tablet Take 1 tablet (0.5 mg total) by mouth at bedtime as needed for anxiety. 06/19/2016: Has on hand; has not been taking  . diltiazem (CARDIZEM CD) 180 MG 24 hr capsule Take 1 capsule (180 mg total) by mouth daily.   Marland Kitchen escitalopram (LEXAPRO) 20 MG tablet Take 20 mg by mouth daily.     . Fluticasone-Salmeterol (ADVAIR) 250-50 MCG/DOSE AEPB Inhale into the lungs 2 (two) times daily.    Marland Kitchen guaiFENesin (MUCINEX) 600 MG 12 hr tablet Take 1 tablet (600 mg total) by mouth 2 (two) times daily.   Marland Kitchen ibuprofen (ADVIL,MOTRIN) 200 MG tablet Take 400 mg by mouth 2 (two) times daily as needed for mild pain or moderate pain.   Marland Kitchen ipratropium-albuterol (DUONEB) 0.5-2.5 (3) MG/3ML SOLN Take 3 mLs by nebulization 3 (three) times daily. 06/27/2016: Not using it 3 times daily  . montelukast (SINGULAIR) 10 MG tablet Take 1 tablet (10 mg total) by mouth at bedtime.   . Multiple Vitamins-Minerals (MULTIVITAMINS THER. W/MINERALS) TABS Take 1 tablet by mouth daily.     Marland Kitchen oxybutynin (DITROPAN) 5 MG tablet Take 5 mg by mouth daily.  09/08/2013: Received from: External Pharmacy  . predniSONE (DELTASONE) 10 MG tablet Take 1 tablet (10 mg total) by mouth every morning. 4 tablets for 3 days, 3 tablets for 3 days, 2 tablets for 3 days and then 1 tablet (10 mg) daily. 08/13/2016: 10 mg maintenance does   . roflumilast (DALIRESP) 500 MCG TABS tablet Take 500 mcg by mouth daily.     Marland Kitchen SPIRIVA RESPIMAT 2.5  MCG/ACT AERS Inhale 1 puff into the lungs daily.  11/14/2014: Received from: External Pharmacy  . traMADol (ULTRAM) 50 MG tablet Take 50 mg by mouth every 6 (six) hours as needed (for back pain).   . triamcinolone (NASACORT ALLERGY 24HR) 55 MCG/ACT AERO nasal inhaler Place 2 sprays into the nose daily as needed (FOR ALLERGIES). 06/19/2016: prn  . fluconazole (DIFLUCAN) 150 MG tablet Take 150 mg by mouth once a week.     No facility-administered encounter medications on file as of 09/17/2016.     Functional Status:   In your present state of health, do you have any difficulty performing the following activities: 09/17/2016 05/14/2016  Hearing? N N  Vision? N N  Difficulty concentrating or making decisions? N N  Walking or climbing stairs? N N  Dressing or bathing? N N  Doing errands, shopping? N N  Some recent data might be hidden    Fall/Depression Screening:    Fall Risk  09/17/2016  Falls in the past year? No  Risk for fall due to : Impaired balance/gait   PHQ 2/9 Scores 01/16/2012  PHQ - 2 Score  1    Assessment:    Mrs Otterstrom continues to report being followed by [facility] for a low grade fever of an unknown origin. This fever is reported never to be over 100, generally 98-99 temperatures and at times reported to be sudden onset with "iron hot" feeling, headache, dizziness, neck pain and pain behind her ear and resolved with "bed fan" Swelling noted of neck more on right side than left She reports her pcp states her right lymph node is swollen more than her left. 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  She confirmed she is also easily bruised and has new bruise on right hand and left forearm Last saw her dermatologist on 07/31/16 and she has concerns with pollen When [facility] CM assessed GI symptoms Mrs Guenthner states she is doing better Mrs Garnet continues to see Dr Juanetta Gosling for her pulmonary concerns (local internal medicine/pulmonologist -a friend) after her  discharge home from [facility].  She states he is completing various tests to attempt to determine her chronic respiratory diagnosis and treatment plan. To see infectious provider, [place] on soon [facility] CM discussed Hematology services   Chronic medical conditions (severe COPD, coronary artery disease with history of supraventricular tachyarrhythmias, HLD)  Mrs Novack reports palpations at intervals and moments of increase heat of body temperature. [facility] CM inquired about her hypothalamus and we discussed her CT of the head results that does not discuss concerns with her hypothalamus.  Her Palpations are managed with Cardizem.  Mrs Colbaugh confirms moments of shortness of breath but with available orders to increase her oxygen as need (3-5L). Mrs Ordones other COPD symptoms, CAD, tachyrhythmia and hyperlipidemiaare managed with her diet, medicines and rest. She states she takes naps to conserve her energy daily from "two to four" in the afternoon.  She reports tachycardia and heat issues 2-3 times a day and at night at times Mrs Ashley is aware of the COPD zones symptoms and when to report symptoms to her pcp and specialists She is able to verbalize understanding of her plan of care for COPD  Acute medical issues -No further diarrhea  Reviewed recent test results listed in EPIC. 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    Plan:  THN CM to follow Mrs Fortman for continued medical issues and see in 1-2 weeks for home visits THN CM to speak with pcp, Hawkins about today assessment  THN to route notes to pcp and care team members  [facility] CM Care Plan Problem One     Most Recent Value  Care Plan Problem One  Knowledge Deficits related to new plan of care for long term self health management of COPD, abdominal pain and tachycardia  Role Documenting the Problem One  Care Management Coordinator  Care Plan for Problem One  Active  THN Long Term Goal (31-90 days)   Over the next 31 days, patient will verbalize understanding of plan of care for long term self health mangaement of COPD, tachycardia as established by pulmonologist/PCP  THN Long Term Goal Start Date  08/13/16  [facility] Long Term Goal Met Date  09/17/16  Interventions for Problem One Long Term Goal  reviewed COPD zone symptoms to report to pcp/pulmonologist, provded [facility] Calendar including list of zone symptoms, consult with pulmonologist/pcp discussed tachycardia  THN CM Short Term Goal #1 (0-30 days)  Over the next 30 days, patient will verbalize understanding of when to call provider  for worsening symptoms  THN CM Short Term Goal #1 Start Date  08/13/16  [facility] CM Short Term Goal #1 Met Date  09/17/16  Interventions for Short Term Goal #1  review of yellow and red zone symptoms, provided list of symptoms  THN CM Short Term Goal #2 (0-30 days)  Over the next 30 days, patient will verablize understanding of plan of care for management of abdominal pain  THN CM Short Term Goal #2 Start Date  08/13/16  [facility] CM Short Term Goal #2 Met Date  09/17/16  Interventions for Short Term Goal #2  assess symptoms, review  treatment plan options, re evaluate for improve,ments  THN CM Short Term Goal #3 (0-30 days)  over the next 30 days patient will be able to get answers to questions about completed test results from pcp and specialists  [facility] CM Short Term Goal #3 Start Date  09/17/16  Interventions for Short Tern Goal #3  review test results, answer questions, educate and provide resources as needed       Micco L. Noelle Penner, RN, BSN, CCM [facility] Care Management [phone]

## 2016-10-01 ENCOUNTER — Ambulatory Visit (INDEPENDENT_AMBULATORY_CARE_PROVIDER_SITE_OTHER): Payer: Medicare Other | Admitting: Internal Medicine

## 2016-10-01 ENCOUNTER — Ambulatory Visit: Payer: Self-pay | Admitting: *Deleted

## 2016-10-01 DIAGNOSIS — D72829 Elevated white blood cell count, unspecified: Secondary | ICD-10-CM | POA: Diagnosis present

## 2016-10-01 DIAGNOSIS — R509 Fever, unspecified: Secondary | ICD-10-CM

## 2016-10-02 DIAGNOSIS — R238 Other skin changes: Secondary | ICD-10-CM | POA: Insufficient documentation

## 2016-10-02 DIAGNOSIS — R509 Fever, unspecified: Secondary | ICD-10-CM | POA: Insufficient documentation

## 2016-10-02 NOTE — Progress Notes (Signed)
[facility]      Reason for Consult: FUO    Referring Physician: Dr. Juanetta Gosling    Patient ID: [name], female    DOB: Sep 25, 1955, 61 y.o.   MRN: [id number]  HPI:   [name] comes in for evaluation of persistent fever.  [name] has a history of COPD and is on home O2 at 4L nasal cannula continuously and is on chronic steroids at 10 mg daily.  [name] was hospitalized in February for acute on chronic hypoxia with hypercapnic respiratory failure due to COPD exacerbation.  Her O2 requirement increased and [name] required higher doses of steroids and now has been back on 10 mg steroids since.  [name] states that since February, [name] has had a persistent fever.  [name] brings in a temperature log and her temperatures have fluctuated between 97 and 99 with an occasional temperature of 100.  [name] does not have any documented fevers.  Also, no fever noted during her hospitalization.  [name] reports feeling 'hot' during these episodes of temperatures of 99-100 and feels poorly including weakness and malaise.  [name] also has had associated headaches and had neck pain.  Dr. Juanetta Gosling and done an extensive work up on her including appropriate labs, lumbar puncture, CT scan of her abdomen and pelvis, neck, head and chest xray.  [name] was noted to have some old granulomatous disease in her lungs but not acute findings or concerns on any of the work up.   CXR independently reviewed and no acute disease.  Labs reviewed and TSH wnl, mildly elevated WBC at 12.2 most recently.   Previous record reviewed from Epic, hospitalization.  [name] did not have any fever then, was treated with steroids and required bipap after discharge.  Work up reviewed and CSF with just 1 WBC, other labs negative or wnl; EBV c/w old disease.   Past Medical History:  Diagnosis Date  . COPD (chronic obstructive pulmonary disease) (HCC)    [facility] oxygen at [facility]  . Diverticulosis   . History of cardiac catheterization    No significant CAD 2005   . Hyperlipidemia   . Hypoglycemia   . IBS (irritable bowel syndrome)   . SVT (supraventricular tachycardia) (HCC)     Prior to Admission medications   Medication Sig Start Date End Date Taking? Authorizing Provider  albuterol (PROAIR HFA) 108 (90 BASE) MCG/ACT inhaler Inhale 2 puffs into the lungs every 6 (six) hours as needed. For shortness of breath   Yes [provider]  ALPRAZolam (XANAX) 0.25 MG tablet Take 0.25 mg by mouth 2 (two) times daily as needed for anxiety. 06/17/16  Yes Kari Baars, MD  aspirin 81 MG tablet Take 81 mg by mouth daily.   Yes [provider]  atorvastatin (LIPITOR) 40 MG tablet Take 1 tablet (40 mg total) by mouth daily. Patient taking differently: Take 40 mg by mouth every evening.  02/29/16  Yes Jonelle Sidle, MD  azithromycin (ZITHROMAX) 250 MG tablet Take by mouth daily. 06/17/16  Yes Kari Baars, MD  cetirizine (ZYRTEC) 10 MG tablet Take 10 mg by mouth daily.     Yes [provider]  clonazePAM (KLONOPIN) 0.5 MG tablet Take 1 tablet (0.5 mg total) by mouth at bedtime as needed for anxiety. 05/27/16  Yes Maxie Barb, MD  diltiazem (CARDIZEM CD) 180 MG 24 hr capsule Take 1 capsule (180 mg total) by mouth daily. 02/29/16  Yes Jonelle Sidle, MD  escitalopram (LEXAPRO) 20  MG tablet Take 20 mg by mouth daily.     Yes [provider]  Fluticasone-Salmeterol (ADVAIR) 250-50 MCG/DOSE AEPB Inhale into the lungs 2 (two) times daily.    Yes [provider]  ibuprofen (ADVIL,MOTRIN) 200 MG tablet Take 400 mg by mouth 2 (two) times daily as needed for mild pain or moderate pain.   Yes [provider]  ipratropium-albuterol (DUONEB) 0.5-2.5 (3) MG/3ML SOLN Take 3 mLs by nebulization 3 (three) times daily. 01/26/15  Yes Erick Blinks, MD  montelukast (SINGULAIR) 10 MG tablet Take 1 tablet (10 mg total) by mouth at bedtime. 01/26/15  Yes Erick Blinks, MD  Multiple Vitamins-Minerals  (MULTIVITAMINS THER. W/MINERALS) TABS Take 1 tablet by mouth daily.     Yes [provider]  oxybutynin (DITROPAN) 5 MG tablet Take 5 mg by mouth daily.  08/28/13  Yes [provider]  predniSONE (DELTASONE) 10 MG tablet Take 1 tablet (10 mg total) by mouth every morning. 4 tablets for 3 days, 3 tablets for 3 days, 2 tablets for 3 days and then 1 tablet (10 mg) daily. 05/30/16  Yes Maxie Barb, MD  roflumilast (DALIRESP) 500 MCG TABS tablet Take 500 mcg by mouth daily.     Yes [provider]  SPIRIVA RESPIMAT 2.5 MCG/ACT AERS Inhale 1 puff into the lungs daily.  10/17/14  Yes [provider]  traMADol (ULTRAM) 50 MG tablet Take 50 mg by mouth every 6 (six) hours as needed (for back pain). 07/18/16  Yes [provider]  triamcinolone (NASACORT ALLERGY 24HR) 55 MCG/ACT AERO nasal inhaler Place 2 sprays into the nose daily as needed (FOR ALLERGIES).   Yes [provider]  acyclovir (ZOVIRAX) 800 MG tablet  07/30/16   [provider]  fluconazole (DIFLUCAN) 150 MG tablet Take 150 mg by mouth once a week.  08/21/12   [provider]  guaiFENesin (MUCINEX) 600 MG 12 hr tablet Take 1 tablet (600 mg total) by mouth 2 (two) times daily. Patient not taking: Reported on 10/01/2016 01/26/15   Erick Blinks, MD    Allergies  Allergen Reactions  . Iohexol Anaphylaxis     Desc: IV DYE  ANAPHYLATIC SHOCK X2 ONCE AFTER PREMEDS   . Chantix [Varenicline] Other (See Comments)    PALPITATIONS  . Ketek [Telithromycin]   . Penicillins Other (See Comments)    unknown    Social History  Substance Use Topics  . Smoking status: Former Smoker    Packs/day: 1.00    Years: 40.00    Types: Cigarettes    Quit date: 05/07/2015  . Smokeless tobacco: Never Used     Comment: presently smoking about 2 a day  . Alcohol use 0.0 oz/week     Comment: Rare    Family History  Problem Relation Age of Onset  . Asthma Mother   . Heart attack  Mother   . Diabetes Mother   . Heart failure Mother     Review of Systems  Constitutional: positive for subjective fever or negative for chills and anorexia Respiratory: positive for cough or sputum, negative for pleurisy/chest pain or pneumonia Gastrointestinal: negative for nausea, vomiting and diarrhea All other systems reviewed and are negative    Constitutional: in no apparent distress and alert There were no vitals filed for this visit. EYES: anicteric ENMT: no thrush Cardiovascular: Cor RRR and No murmurs Respiratory: distant, cta b; normal respiratory effort; on 4L [place] GI: Bowel sounds are normal, liver is not enlarged, spleen  is not enlarged, soft Musculoskeletal: no pedal edema noted Skin: negatives: no rash Hematologic: no cervical or supraclavicular lad  Labs: Lab Results  Component Value Date   WBC 12.2 (H) 09/11/2016   HGB 12.7 09/11/2016   HCT 39.5 09/11/2016   MCV 101.0 (H) 09/11/2016   PLT 334 09/11/2016    Lab Results  Component Value Date   CREATININE 0.79 09/11/2016   BUN 17 09/11/2016   NA 139 09/11/2016   K 3.8 09/11/2016   CL 97 (L) 09/11/2016   CO2 35 (H) 09/11/2016    Lab Results  Component Value Date   ALT 21 09/11/2016   AST 24 09/11/2016   ALKPHOS 100 09/11/2016   BILITOT 0.3 09/11/2016   INR 1.01 01/20/2015     Assessment: Subjective warmth.  No fever documented though [name] does have episodes where [name] feels poorly.  I most suspect this is a steroid effect.  Since [name] has not had any concerning fever so I do not feel any further work up is warranted.  I discussed that it would be ideal to decrease her steroid dose though I suspect this will not be possible with her advanced lung disease.   Also with leukocytosis which is c/w steroid affect  Plan: 1) no further work up 2) slow wean of steroids if possible per Dr. Juanetta Gosling  Follow up PRN Thanks for referral

## 2016-10-03 ENCOUNTER — Other Ambulatory Visit: Payer: Self-pay | Admitting: *Deleted

## 2016-10-03 DIAGNOSIS — J9621 Acute and chronic respiratory failure with hypoxia: Secondary | ICD-10-CM | POA: Diagnosis not present

## 2016-10-03 DIAGNOSIS — J9622 Acute and chronic respiratory failure with hypercapnia: Secondary | ICD-10-CM | POA: Diagnosis not present

## 2016-10-03 DIAGNOSIS — J441 Chronic obstructive pulmonary disease with (acute) exacerbation: Secondary | ICD-10-CM | POA: Diagnosis not present

## 2016-10-03 DIAGNOSIS — F329 Major depressive disorder, single episode, unspecified: Secondary | ICD-10-CM | POA: Diagnosis not present

## 2016-10-03 DIAGNOSIS — D649 Anemia, unspecified: Secondary | ICD-10-CM | POA: Diagnosis not present

## 2016-10-03 DIAGNOSIS — I2510 Atherosclerotic heart disease of native coronary artery without angina pectoris: Secondary | ICD-10-CM | POA: Diagnosis not present

## 2016-10-03 NOTE — Patient Outreach (Signed)
[facility]) Care Management   10/03/2016  [name] [birth year] [id number]  [name] is an 61 y.o. female with hx of severe COPD, coronary artery disease with history of supraventricular tachyarrhythmias, HLD [name] had been admitted to [facility] skilled nursing facility (snf) for rehab on 05/28/16 after being discharged from the hospital (admission on 05/06/16 for hypoxic and hypercapnic respiratory failure/COPD). She was seen by [facility] snffacility liaison and referred to [facility] community care management. Several unsuccessful attempts to contact [name] after her initial [facility] CM home visit was completed. [name] reached out to [facility] main office after unsuccessful contact attempts and case closure. This [facility] CM staff re engaged with [name] and reviewed [facility] services. THN CM is seeing Mrs [name] today for a [facility] CM home follow up visit She continues to have a low grade temperature and is now being seen for pcp and pulmonology services by Dr Juanetta Gosling versus Dr Sherwood Gambler     Subjective:  "I will try to wean off the prednisone  Objective:  BP 108/70   Pulse 96   Temp 98.6 F (37 C) (Oral)   Resp 20   SpO2 97%    Review of Systems  Constitutional: Positive for chills, fever and malaise/fatigue. Negative for diaphoresis and weight loss.  HENT: Negative for congestion, ear discharge, ear pain, hearing loss, nosebleeds, sinus pain, sore throat and tinnitus.   Eyes: Negative.  Negative for blurred vision, double vision, photophobia, pain, discharge and redness.       Floaters in eyes   Respiratory: Positive for cough. Negative for hemoptysis, sputum production, shortness of breath, wheezing and stridor.        Cough when strangled on drink    Cardiovascular: Negative.  Negative for chest pain, palpitations, orthopnea, claudication and leg swelling.  Gastrointestinal: Negative.  Negative for abdominal pain, blood in stool, constipation, diarrhea, heartburn, melena, nausea  and vomiting.  Genitourinary: Negative.  Negative for dysuria, flank pain, frequency, hematuria and urgency.  Musculoskeletal: Negative.  Negative for back pain, falls, joint pain, myalgias and neck pain.  Skin: Negative for itching and rash.  Neurological: Positive for headaches. Negative for tingling, tremors, sensory change, speech change, focal weakness, seizures, loss of consciousness and weakness.  Endo/Heme/Allergies: Negative for environmental allergies and polydipsia. Bruises/bleeds easily.  Psychiatric/Behavioral: Negative for depression, hallucinations, memory loss, substance abuse and suicidal ideas. The patient has insomnia. The patient is not nervous/anxious.        Awaken during night - not restful    Physical Exam  Constitutional: She appears well-developed and well-nourished.  HENT:  Head: Normocephalic and atraumatic.  Eyes: EOM are normal. Pupils are equal, round, and reactive to light.    Encounter Medications:   Outpatient Encounter Prescriptions as of 10/03/2016  Medication Sig Note  . acyclovir (ZOVIRAX) 800 MG tablet    . albuterol (PROAIR HFA) 108 (90 BASE) MCG/ACT inhaler Inhale 2 puffs into the lungs every 6 (six) hours as needed. For shortness of breath   . ALPRAZolam (XANAX) 0.25 MG tablet Take 0.25 mg by mouth 2 (two) times daily as needed for anxiety.   Marland Kitchen aspirin 81 MG tablet Take 81 mg by mouth daily.   Marland Kitchen atorvastatin (LIPITOR) 40 MG tablet Take 1 tablet (40 mg total) by mouth daily. (Patient taking differently: Take 40 mg by mouth every evening. )   . azithromycin (ZITHROMAX) 250 MG tablet Take by mouth daily. 08/13/2016: Taking MW F   . cetirizine (ZYRTEC) 10  MG tablet Take 10 mg by mouth daily.     . clonazePAM (KLONOPIN) 0.5 MG tablet Take 1 tablet (0.5 mg total) by mouth at bedtime as needed for anxiety. 06/19/2016: Has on hand; has not been taking  . diltiazem (CARDIZEM CD) 180 MG 24 hr capsule Take 1 capsule (180 mg total) by mouth daily.   Marland Kitchen  escitalopram (LEXAPRO) 20 MG tablet Take 20 mg by mouth daily.     . fluconazole (DIFLUCAN) 150 MG tablet Take 150 mg by mouth once a week.    . Fluticasone-Salmeterol (ADVAIR) 250-50 MCG/DOSE AEPB Inhale into the lungs 2 (two) times daily.    Marland Kitchen guaiFENesin (MUCINEX) 600 MG 12 hr tablet Take 1 tablet (600 mg total) by mouth 2 (two) times daily. (Patient not taking: Reported on 10/01/2016)   . ibuprofen (ADVIL,MOTRIN) 200 MG tablet Take 400 mg by mouth 2 (two) times daily as needed for mild pain or moderate pain.   Marland Kitchen ipratropium-albuterol (DUONEB) 0.5-2.5 (3) MG/3ML SOLN Take 3 mLs by nebulization 3 (three) times daily. 06/27/2016: Not using it 3 times daily  . montelukast (SINGULAIR) 10 MG tablet Take 1 tablet (10 mg total) by mouth at bedtime.   . Multiple Vitamins-Minerals (MULTIVITAMINS THER. W/MINERALS) TABS Take 1 tablet by mouth daily.     Marland Kitchen oxybutynin (DITROPAN) 5 MG tablet Take 5 mg by mouth daily.  09/08/2013: Received from: External Pharmacy  . predniSONE (DELTASONE) 10 MG tablet Take 1 tablet (10 mg total) by mouth every morning. 4 tablets for 3 days, 3 tablets for 3 days, 2 tablets for 3 days and then 1 tablet (10 mg) daily. 08/13/2016: 10 mg maintenance does   . roflumilast (DALIRESP) 500 MCG TABS tablet Take 500 mcg by mouth daily.     Marland Kitchen SPIRIVA RESPIMAT 2.5 MCG/ACT AERS Inhale 1 puff into the lungs daily.  11/14/2014: Received from: External Pharmacy  . traMADol (ULTRAM) 50 MG tablet Take 50 mg by mouth every 6 (six) hours as needed (for back pain).   . triamcinolone (NASACORT ALLERGY 24HR) 55 MCG/ACT AERO nasal inhaler Place 2 sprays into the nose daily as needed (FOR ALLERGIES). 06/19/2016: prn   No facility-administered encounter medications on file as of 10/03/2016.     Functional Status:   In your present state of health, do you have any difficulty performing the following activities: 09/17/2016 05/14/2016  Hearing? N N  Vision? N N  Difficulty concentrating or making decisions? N N   Walking or climbing stairs? N N  Dressing or bathing? N N  Doing errands, shopping? N N  Some recent data might be hidden    Fall/Depression Screening:    Fall Risk  09/17/2016  Falls in the past year? No  Risk for fall due to : Impaired balance/gait   PHQ 2/9 Scores 01/16/2012  PHQ - 2 Score 1    Assessment:    She voiced her response to Dr Comer's conclusion that her primary medical issues related to use of prednisone  Discussed with her that she could have her hormone balance checked with her ob gyn and she reports she missed ob gyn appts recently  O2 at 4 L [place] in the home Discussed her response to weaning off steroid (She reports her family has discussed her weaning off of prednisone 5+ yr also) Reports she is up to 2 naps a day and feels wiped out with moving around her home  Either wean or maintain prednisone is the options she is considering  Recommend checking vitals during actual episode of increased temperature and recording the values  She discussed her weight was previously 95 lbs and now up to 125 lbs with steroid use -showed THN CM old pictures of herself   Plan:  To follow up with Mrs Earnhardt in 2 weeks and route note to her providers   [facility] CM Care Plan Problem One     Most Recent Value  Care Plan Problem One  Knowledge Deficits related to new plan of care for long term self health management of COPD, abdominal pain and tachycardia  Role Documenting the Problem One  Care Management Coordinator  Care Plan for Problem One  Active  THN Long Term Goal   Over the next 31 days, patient will verbalize understanding of plan of care for long term self health mangaement of COPD, tachycardia as established by pulmonologist/PCP  THN Long Term Goal Start Date  08/13/16  [facility] Long Term Goal Met Date  09/17/16  Interventions for Problem One Long Term Goal  reviewed COPD zone symptoms to report to pcp/pulmonologist, provded [facility] Calendar including list of zone symptoms, consult with  pulmonologist/pcp discussed tachycardia  THN CM Short Term Goal #1   Over the next 30 days, patient will verbalize understanding of when to call provider for worsening symptoms  THN CM Short Term Goal #1 Start Date  08/13/16  [facility] CM Short Term Goal #1 Met Date  09/17/16  Interventions for Short Term Goal #1  review of yellow and red zone symptoms, provided list of symptoms  THN CM Short Term Goal #2   Over the next 30 days, patient will verablize understanding of plan of care for management of abdominal pain  THN CM Short Term Goal #2 Start Date  08/13/16  [facility] CM Short Term Goal #2 Met Date  09/17/16  Interventions for Short Term Goal #2  assess symptoms, review  treatment plan options, re evaluate for improve,ments  THN CM Short Term Goal #3  over the next 30 days patient will be able to get answers to questions about completed test results from pcp and specialists  [facility] CM Short Term Goal #3 Start Date  09/17/16  Interventions for Short Tern Goal #3  review test results, answer questions, educate and provide resources as needed       Morriston L. Noelle Penner, RN, BSN, CCM [facility] Care Management [phone]

## 2016-10-09 DIAGNOSIS — I10 Essential (primary) hypertension: Secondary | ICD-10-CM | POA: Diagnosis not present

## 2016-10-09 DIAGNOSIS — J449 Chronic obstructive pulmonary disease, unspecified: Secondary | ICD-10-CM | POA: Diagnosis not present

## 2016-10-09 DIAGNOSIS — J9611 Chronic respiratory failure with hypoxia: Secondary | ICD-10-CM | POA: Diagnosis not present

## 2016-10-09 DIAGNOSIS — R502 Drug induced fever: Secondary | ICD-10-CM | POA: Diagnosis not present

## 2016-10-14 DIAGNOSIS — F329 Major depressive disorder, single episode, unspecified: Secondary | ICD-10-CM | POA: Diagnosis not present

## 2016-10-14 DIAGNOSIS — I2510 Atherosclerotic heart disease of native coronary artery without angina pectoris: Secondary | ICD-10-CM | POA: Diagnosis not present

## 2016-10-14 DIAGNOSIS — J9622 Acute and chronic respiratory failure with hypercapnia: Secondary | ICD-10-CM | POA: Diagnosis not present

## 2016-10-14 DIAGNOSIS — D649 Anemia, unspecified: Secondary | ICD-10-CM | POA: Diagnosis not present

## 2016-10-14 DIAGNOSIS — J441 Chronic obstructive pulmonary disease with (acute) exacerbation: Secondary | ICD-10-CM | POA: Diagnosis not present

## 2016-10-14 DIAGNOSIS — J9621 Acute and chronic respiratory failure with hypoxia: Secondary | ICD-10-CM | POA: Diagnosis not present

## 2016-10-24 ENCOUNTER — Other Ambulatory Visit: Payer: Self-pay | Admitting: *Deleted

## 2016-10-24 NOTE — Patient Outreach (Signed)
[facility]) Care Management   10/29/2016  [name] January 04, 1956 [id number]  [name] is an 61 y.o. female with hx of severe COPD, coronary artery disease with history of supraventricular tachyarrhythmias, HLD [name] had been admitted to [facility] skilled nursing facility (snf) for rehab on 05/28/16 after being discharged from the hospital (admission on 05/06/16 for hypoxic and hypercapnic respiratory failure/COPD). She was seen by [facility] snffacility liaison and referred to [facility] community care management. Several unsuccessful attempts to contact [name] after her initial [facility] CM home visit was completed. [name] reached out to [facility] main office after unsuccessful contact attempts and case closure. This [facility] CM staff re engaged with [name] and reviewed [facility] services. 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 Negative for chills, diaphoresis, malaise/fatigue and weight loss.  HENT: Negative for congestion, ear discharge, ear pain, hearing loss, nosebleeds, sinus pain, sore throat and tinnitus.   Eyes: Negative for blurred vision, double vision, photophobia, pain, discharge and redness.  Respiratory: Positive for cough, shortness of breath and wheezing. Negative for hemoptysis, sputum production and stridor.        Posterior right lobe and lower left lobe  Left upper lobe wheezing Pain from not being able to breath well    Cardiovascular: Positive for leg swelling. Negative for chest pain, palpitations, orthopnea, claudication and PND.       Left ankle mild swelling   Gastrointestinal: Positive for abdominal pain. Negative for blood in stool, constipation, diarrhea, heartburn, melena, nausea and vomiting.  Genitourinary: Negative for dysuria, flank pain, frequency, hematuria and urgency.  Musculoskeletal: Negative for back pain, falls, joint pain, myalgias and neck pain.  Skin: Negative for itching and rash.  Neurological: Positive for dizziness and headaches. Negative for tingling, tremors, sensory change, speech change, focal weakness, seizures, loss of consciousness and weakness.       Occasional dizziness and and headaches but less frequent  Endo/Heme/Allergies: Negative for environmental allergies and polydipsia. Bruises/bleeds easily.       Left forearm bruising from dog x 2 Right forearm bruises x 3   Psychiatric/Behavioral: Negative for depression, hallucinations, memory loss, substance abuse and suicidal ideas. The patient is not nervous/anxious and does not have insomnia.     Physical Exam  Constitutional: She is oriented to person, place, and time. She appears well-developed and well-nourished.  HENT:  Head: Normocephalic and atraumatic.  Eyes: Conjunctivae are normal. Pupils are equal, round, and reactive to light.  Neck: Normal range of motion.  Cardiovascular: Normal rate and regular rhythm.   Respiratory: She has wheezes.  GI: She exhibits distension. There is tenderness.  Musculoskeletal: Normal range of motion.  Neurological: She is alert and oriented to person, place, and time.  Skin: Skin is warm and dry.  Psychiatric: She has a normal mood  and affect. Her behavior is normal. Judgment and thought content normal.    Encounter Medications:   Outpatient Encounter Prescriptions as of 10/24/2016  Medication Sig Note  . acyclovir (ZOVIRAX) 800 MG tablet    . albuterol (PROAIR HFA) 108 (90  BASE) MCG/ACT inhaler Inhale 2 puffs into the lungs every 6 (six) hours as needed. For shortness of breath   . ALPRAZolam (XANAX) 0.25 MG tablet Take 0.25 mg by mouth 2 (two) times daily as needed for anxiety.   Marland Kitchen aspirin 81 MG tablet Take 81 mg by mouth daily.   Marland Kitchen atorvastatin (LIPITOR) 40 MG tablet Take 1 tablet (40 mg total) by mouth daily. (Patient taking differently: Take 40 mg by mouth every evening. )   . azithromycin (ZITHROMAX) 250 MG tablet Take by mouth daily. 08/13/2016: Taking MW F   . cetirizine (ZYRTEC) 10 MG tablet Take 10 mg by mouth daily.     . clonazePAM (KLONOPIN) 0.5 MG tablet Take 1 tablet (0.5 mg total) by mouth at bedtime as needed for anxiety. 06/19/2016: Has on hand; has not been taking  . diltiazem (CARDIZEM CD) 180 MG 24 hr capsule Take 1 capsule (180 mg total) by mouth daily.   Marland Kitchen escitalopram (LEXAPRO) 20 MG tablet Take 20 mg by mouth daily.     . fluconazole (DIFLUCAN) 150 MG tablet Take 150 mg by mouth once a week.    . Fluticasone-Salmeterol (ADVAIR) 250-50 MCG/DOSE AEPB Inhale into the lungs 2 (two) times daily.    Marland Kitchen guaiFENesin (MUCINEX) 600 MG 12 hr tablet Take 1 tablet (600 mg total) by mouth 2 (two) times daily.   Marland Kitchen ibuprofen (ADVIL,MOTRIN) 200 MG tablet Take 400 mg by mouth 2 (two) times daily as needed for mild pain or moderate pain.   Marland Kitchen ipratropium-albuterol (DUONEB) 0.5-2.5 (3) MG/3ML SOLN Take 3 mLs by nebulization 3 (three) times daily. 06/27/2016: Not using it 3 times daily  . montelukast (SINGULAIR) 10 MG tablet Take 1 tablet (10 mg total) by mouth at bedtime.   . Multiple Vitamins-Minerals (MULTIVITAMINS THER. W/MINERALS) TABS Take 1 tablet by mouth daily.     Marland Kitchen oxybutynin (DITROPAN) 5 MG tablet Take 5 mg by mouth daily.  09/08/2013: Received from: External Pharmacy  . predniSONE (DELTASONE) 10 MG tablet Take 1 tablet (10 mg total) by mouth every morning. 4 tablets for 3 days, 3 tablets for 3 days, 2 tablets for 3 days and then 1 tablet (10 mg) daily.  08/13/2016: 10 mg maintenance does   . roflumilast (DALIRESP) 500 MCG TABS tablet Take 500 mcg by mouth daily.     Marland Kitchen SPIRIVA RESPIMAT 2.5 MCG/ACT AERS Inhale 1 puff into the lungs daily.  11/14/2014: Received from: External Pharmacy  . traMADol (ULTRAM) 50 MG tablet Take 50 mg by mouth every 6 (six) hours as needed (for back pain).   . triamcinolone (NASACORT ALLERGY 24HR) 55 MCG/ACT AERO nasal inhaler Place 2 sprays into the nose daily as needed (FOR ALLERGIES). 06/19/2016: prn   No facility-administered encounter medications on file as of 10/24/2016.     Functional Status:   In your present state of health, do you have any difficulty performing the following activities: 09/17/2016 05/14/2016  Hearing? N N  Vision? N N  Difficulty concentrating or making decisions? N N  Walking or climbing stairs? N N  Dressing or bathing? N N  Doing errands, shopping? N N  Some recent data might be hidden    Fall/Depression Screening:    Fall Risk  09/17/2016  Falls in the past year? No  Risk for fall due to : Impaired balance/gait   PHQ 2/9 Scores 01/16/2012  PHQ - 2 Score 1    Assessment:   Cynthia Beatrice present at her kitchen table  She reports weaning off of prednisone as ordered  Audible grunts wheezing noted with rest, sitting at table Left side of face swollen more than the right side of her face. She reports her "glands in my neck are swollen"   Plan: THN CM will follow up with Cynthia Atmore with a home visit in the next 1-2 weeks   THN CM to send pcp update on pt home visit exam via in basket message [facility] CM Care Plan Problem One     Most Recent Value  Care Plan Problem One  Knowledge Deficits related to new plan of care for long term self health management of COPD, abdominal pain and tachycardia  Role Documenting the Problem One  Care Management Coordinator  Care Plan for Problem One  Active  THN Long Term Goal   Over the next 31 days, patient will verbalize understanding of plan of care for long  term self health mangaement of COPD, tachycardia as established by pulmonologist/PCP  THN Long Term Goal Start Date  08/13/16  [facility] Long Term Goal Met Date  09/17/16  Interventions for Problem One Long Term Goal  reviewed COPD zone symptoms to report to pcp/pulmonologist, provded [facility] Calendar including list of zone symptoms, consult with pulmonologist/pcp discussed tachycardia  THN CM Short Term Goal #1   Over the next 30 days, patient will verbalize understanding of when to call provider for worsening symptoms  THN CM Short Term Goal #1 Start Date  08/13/16  [facility] CM Short Term Goal #1 Met Date  09/17/16  Interventions for Short Term Goal #1  review of yellow and red zone symptoms, provided list of symptoms  THN CM Short Term Goal #2   Over the next 30 days, patient will verablize understanding of plan of care for management of abdominal pain  THN CM Short Term Goal #2 Start Date  08/13/16  [facility] CM Short Term Goal #2 Met Date  09/17/16  Interventions for Short Term Goal #2  assess symptoms, review  treatment plan options, re evaluate for improve,ments  THN CM Short Term Goal #3  over the next 30 days patient will be able to get answers to questions about completed test results from pcp and specialists  [facility] CM Short Term Goal #3 Start Date  09/17/16  [facility] CM Short Term Goal #3 Met Date  (P) 10/24/16  Interventions for Short Tern Goal #3  review test results, answer questions, educate and provide resources as needed      Lacassine L. Noelle Penner, RN, BSN, CCM [facility] Care Management [phone]

## 2016-11-07 ENCOUNTER — Other Ambulatory Visit: Payer: Self-pay | Admitting: *Deleted

## 2016-11-25 DIAGNOSIS — I10 Essential (primary) hypertension: Secondary | ICD-10-CM | POA: Diagnosis not present

## 2016-11-25 DIAGNOSIS — J449 Chronic obstructive pulmonary disease, unspecified: Secondary | ICD-10-CM | POA: Diagnosis not present

## 2016-11-25 DIAGNOSIS — R502 Drug induced fever: Secondary | ICD-10-CM | POA: Diagnosis not present

## 2016-11-25 DIAGNOSIS — J9611 Chronic respiratory failure with hypoxia: Secondary | ICD-10-CM | POA: Diagnosis not present

## 2016-11-28 ENCOUNTER — Other Ambulatory Visit: Payer: Self-pay | Admitting: *Deleted

## 2016-11-28 NOTE — Patient Outreach (Addendum)
[facility]) Care Management   11/28/2016  [name] 12-Jan-1956 [id number]  [name] is an 61 y.o. female with hx of severe COPD, coronary artery disease with history of supraventricular tachyarrhythmias, HLD [name] had been admitted to [facility] skilled nursing facility (snf) for rehab on 05/28/16 after being discharged from the hospital (admission on 05/06/16 for hypoxic and hypercapnic respiratory failure/COPD). She was seen by [facility] snffacility liaison and referred to [facility] community care management. Several unsuccessful attempts to contact [name] after her initial [facility] CM home visit was completed. [name] reached out to [facility] main office after unsuccessful contact attempts and case closure. This [facility] CM staff re engaged with [name] and reviewed [facility] services. [facility] CM is seeing [name] today for a [facility] CM home follow up visit She continues to have a low grade temperature and is now being seen for pcp and pulmonology services by Dr Juanetta Gosling versus Dr Sherwood Gambler  This is the 7th [facility] Cm home visit   Subjective: "I'm doing okay"  Objective:   BP 122/70   Pulse 88   Temp 99 F (37.2 C) (Oral)   Resp 20   Ht 1.575 m (5\' 2" )   Wt 132 lb (59.9 kg)   SpO2 98%   BMI 24.14 kg/m   Review of Systems  HENT: Negative.   Eyes: Negative.   Respiratory: Positive for cough. Negative for hemoptysis, sputum production, shortness of breath and wheezing.   Gastrointestinal: Negative.  Negative for abdominal pain, blood in stool, constipation, diarrhea, heartburn, melena, nausea and vomiting.  Genitourinary: Negative.  Negative for dysuria, flank pain, frequency, hematuria and urgency.  Musculoskeletal: Negative for back pain, falls, joint pain, myalgias and neck pain.  Skin: Negative.   Neurological: Positive for weakness.  Psychiatric/Behavioral: Negative.  Negative for depression, hallucinations, memory loss, substance abuse and suicidal ideas. The patient is not  nervous/anxious and does not have insomnia.     Physical Exam  Constitutional: She is oriented to person, place, and time. She appears well-developed and well-nourished.  HENT:  Head: Normocephalic and atraumatic.  Eyes: Pupils are equal, round, and reactive to light.  Neck: Normal range of motion. Neck supple.  Cardiovascular: Normal rate, normal heart sounds and intact distal pulses.   Respiratory: Effort normal and breath sounds normal.  GI: Soft. Bowel sounds are normal.  Musculoskeletal: Normal range of motion.  Neurological: She is alert and oriented to person, place, and time.  Skin: Skin is warm and dry.  Psychiatric: She has a normal mood and affect. Her behavior is normal. Judgment and thought content normal.    Encounter Medications:   Outpatient Encounter Prescriptions as of 11/28/2016  Medication Sig Note  . albuterol (PROAIR HFA) 108 (90 BASE) MCG/ACT inhaler Inhale 2 puffs into the lungs every 6 (six) hours as needed. For shortness of breath   . ALPRAZolam (XANAX) 0.25 MG tablet Take 0.25 mg by mouth 2 (two) times daily as needed for anxiety.   Marland Kitchen aspirin 81 MG tablet Take 81 mg by mouth daily.   Marland Kitchen atorvastatin (LIPITOR) 40 MG tablet Take 1 tablet (40 mg total) by mouth daily. (Patient taking differently: Take 40 mg by mouth every evening. )   . azithromycin (ZITHROMAX) 250 MG tablet Take by mouth daily. 08/13/2016: Taking MW F   . cetirizine (ZYRTEC) 10 MG tablet Take 10 mg by mouth daily.     . clonazePAM (KLONOPIN) 0.5 MG tablet Take 1 tablet (0.5 mg total)  by mouth at bedtime as needed for anxiety. 06/19/2016: Has on hand; has not been taking  . diltiazem (CARDIZEM CD) 180 MG 24 hr capsule Take 1 capsule (180 mg total) by mouth daily.   Marland Kitchen escitalopram (LEXAPRO) 20 MG tablet Take 20 mg by mouth daily.     . Fluticasone-Salmeterol (ADVAIR) 250-50 MCG/DOSE AEPB Inhale into the lungs 2 (two) times daily.    Marland Kitchen guaiFENesin (MUCINEX) 600 MG 12 hr tablet Take 600 mg by mouth as  needed.   Marland Kitchen ibuprofen (ADVIL,MOTRIN) 200 MG tablet Take 400 mg by mouth 2 (two) times daily as needed for mild pain or moderate pain.   Marland Kitchen ipratropium-albuterol (DUONEB) 0.5-2.5 (3) MG/3ML SOLN Take 3 mLs by nebulization 3 (three) times daily. 06/27/2016: Not using it 3 times daily  . montelukast (SINGULAIR) 10 MG tablet Take 1 tablet (10 mg total) by mouth at bedtime.   . Multiple Vitamins-Minerals (MULTIVITAMINS THER. W/MINERALS) TABS Take 1 tablet by mouth daily.     Marland Kitchen oxybutynin (DITROPAN) 5 MG tablet Take 5 mg by mouth daily.  09/08/2013: Received from: External Pharmacy  . predniSONE (DELTASONE) 5 MG tablet Take 5 mg by mouth daily. As of November 06 2016 started on prednisone 8 mg po , Tapering prednisone by 1 mg every 3-4  Weeks, Presently taking 5 mg tab and 3 - 1 mg tabs   . roflumilast (DALIRESP) 500 MCG TABS tablet Take 500 mcg by mouth daily.     Marland Kitchen SPIRIVA RESPIMAT 2.5 MCG/ACT AERS Inhale 1 puff into the lungs daily.  11/14/2014: Received from: External Pharmacy  . traMADol (ULTRAM) 50 MG tablet Take 50 mg by mouth every 6 (six) hours as needed (for back pain).   . triamcinolone (NASACORT ALLERGY 24HR) 55 MCG/ACT AERO nasal inhaler Place 2 sprays into the nose daily as needed (FOR ALLERGIES). 06/19/2016: prn  . acyclovir (ZOVIRAX) 800 MG tablet    . fluconazole (DIFLUCAN) 150 MG tablet Take 150 mg by mouth once a week.    . [DISCONTINUED] guaiFENesin (MUCINEX) 600 MG 12 hr tablet Take 1 tablet (600 mg total) by mouth 2 (two) times daily. (Patient not taking: Reported on 11/28/2016) 11/28/2016: changed to prn   . [DISCONTINUED] predniSONE (DELTASONE) 10 MG tablet Take 1 tablet (10 mg total) by mouth every morning. 4 tablets for 3 days, 3 tablets for 3 days, 2 tablets for 3 days and then 1 tablet (10 mg) daily. (Patient not taking: Reported on 11/28/2016) 11/28/2016: weaning prednisone   No facility-administered encounter medications on file as of 11/28/2016.     Functional Status:   In your present  state of health, do you have any difficulty performing the following activities: 11/28/2016 09/17/2016  Hearing? N N  Vision? N N  Difficulty concentrating or making decisions? N N  Walking or climbing stairs? N N  Dressing or bathing? N N  Doing errands, shopping? N N  Preparing Food and eating ? N -  Using the Toilet? N -  In the past six months, have you accidently leaked urine? N -  Do you have problems with loss of bowel control? N -  Managing your Medications? N -  Managing your Finances? N -  Housekeeping or managing your Housekeeping? N -  Some recent data might be hidden    Fall/Depression Screening:    Fall Risk  11/28/2016 09/17/2016  Falls in the past year? No No  Risk for fall due to : Impaired balance/gait Impaired balance/gait   PHQ  2/9 Scores 11/28/2016 01/16/2012  PHQ - 2 Score 0 1    Assessment:   [name] presents to day at her dining room table with her oxygen intact  She reports she is tolerating her decrease in prednisone with only noted symptoms of increase appetite and weight She saw pcp, Dr Juanetta Gosling on 11/25/16 and is progressing well per pcp Cynthia Dam' voiced concern today is an increased abdominal girth. Reviewed with her the impression of the last CT (abdomen). [name] reports having an increase appetite, weight gain and hunger without bloating or s/s of ascites  [facility] CM recommended working with a nutritionist but [name] denied the need to work with a nutritionist States she is "fine" with her present weight Discussed BMI wt of 137 lbs per the BMI scale would indicate that she is overweight. [name] present weight of 132 lb at 5'2" indicates per the BMI scale that [name] is not overweight and BMI is 24.1 and normal [name] has met her [facility] goals - she is able to verbalize using teach back method her home care routine with assistance of her daughter who are both in the medical profession.  She prefers continued monitoring for weaning off prednisone    Plan: to see [name] for monthly home visits; next agreed upon visit is in 4 weeks  [facility] CM Care Plan Problem One     Most Recent Value  Care Plan Problem One  Knowledge Deficits related to new plan of care for long term self health management of COPD, abdominal pain and tachycardia  Role Documenting the Problem One  Care Management Coordinator  Care Plan for Problem One  Active  THN Long Term Goal   Over the next 31 days, patient will verbalize understanding of plan of care for long term self health mangaement of COPD, tachycardia as established by pulmonologist/PCP  THN Long Term Goal Start Date  08/13/16  [facility] Long Term Goal Met Date  09/17/16  Interventions for Problem One Long Term Goal  reviewed COPD zone symptoms to report to pcp/pulmonologist, provded [facility] Calendar including list of zone symptoms, consult with pulmonologist/pcp discussed tachycardia  THN CM Short Term Goal #1   Over the next 30 days, patient will verbalize understanding of when to call provider for worsening symptoms  THN CM Short Term Goal #1 Start Date  08/13/16  [facility] CM Short Term Goal #1 Met Date  09/17/16  Interventions for Short Term Goal #1  review of yellow and red zone symptoms, provided list of symptoms  THN CM Short Term Goal #2   Over the next 30 days, patient will verablize understanding of plan of care for management of abdominal pain  THN CM Short Term Goal #2 Start Date  08/13/16  [facility] CM Short Term Goal #2 Met Date  09/17/16  Interventions for Short Term Goal #2  assess symptoms, review  treatment plan options, re evaluate for improve,ments  THN CM Short Term Goal #3  over the next 30 days patient will be able to get answers to questions about completed test results from pcp and specialists  [facility] CM Short Term Goal #3 Start Date  09/17/16  [facility] CM Short Term Goal #3 Met Date  10/24/16  Interventions for Short Tern Goal #3  review test results, answer questions, educate and provide resources as needed        [place] L. Noelle Penner, RN, BSN, CCM [facility] Care Management [phone]

## 2016-12-03 NOTE — Patient Outreach (Signed)
[facility]) Care Management   11/07/2016  [name] April 20, 1956 [id number]  [name] is an 62 y.o. female with hx of severe COPD, coronary artery disease with history of supraventricular tachyarrhythmias, HLD [name] had been admitted to [facility] skilled nursing facility (snf) for rehab on 05/28/16 after being discharged from the hospital (admission on 05/06/16 for hypoxic and hypercapnic respiratory failure/COPD). She was seen by [facility] snffacility liaison and referred to [facility] community care management. Several unsuccessful attempts to contact [name] after her initial [facility] CM home visit was completed. [name] reached out to [facility] main office after unsuccessful contact attempts and case closure. This [facility] CM staff re engaged with [name] and reviewed [facility] services. [facility] CM is seeing [name] today for a [facility] CM home follow up visit She continues to have a low grade temperature and is now being seen for pcp and pulmonology services by [name] versus [name]  This is the 6th [facility] Cm home visit  Subjective:   Objective:  BP 128/72   Pulse 94   Temp 99.2 F (37.3 C) (Oral)   SpO2 98%    Review of Systems  Constitutional: Positive for fever.  HENT: Negative.   Eyes: Negative.   Respiratory: Negative.   Cardiovascular: Negative.   Gastrointestinal: Negative.   Genitourinary: Negative.   Musculoskeletal: Positive for back pain.  Skin: Negative.   Neurological: Positive for weakness.  Endo/Heme/Allergies: Bruises/bleeds easily.  Psychiatric/Behavioral: Negative.     Physical Exam  Constitutional: She is oriented to person, place, and time. She appears well-developed and well-nourished.  HENT:  Head: Normocephalic and atraumatic.  Eyes: Pupils are equal, round, and reactive to light. Conjunctivae are normal.  Neck: Normal range of motion.  Cardiovascular: Normal rate and regular rhythm.   Respiratory: She has wheezes.  GI: Soft. She exhibits  distension.  Musculoskeletal: Normal range of motion.  Neurological: She is alert and oriented to person, place, and time.  Skin: Skin is warm and dry.  Psychiatric: She has a normal mood and affect. Her behavior is normal. Judgment and thought content normal.    Encounter Medications:   Outpatient Encounter Prescriptions as of 11/07/2016  Medication Sig Note  . acyclovir (ZOVIRAX) 800 MG tablet    . albuterol (PROAIR HFA) 108 (90 BASE) MCG/ACT inhaler Inhale 2 puffs into the lungs every 6 (six) hours as needed. For shortness of breath   . ALPRAZolam (XANAX) 0.25 MG tablet Take 0.25 mg by mouth 2 (two) times daily as needed for anxiety.   Marland Kitchen aspirin 81 MG tablet Take 81 mg by mouth daily.   Marland Kitchen atorvastatin (LIPITOR) 40 MG tablet Take 1 tablet (40 mg total) by mouth daily. (Patient taking differently: Take 40 mg by mouth every evening. )   . azithromycin (ZITHROMAX) 250 MG tablet Take by mouth daily. 08/13/2016: Taking MW F   . cetirizine (ZYRTEC) 10 MG tablet Take 10 mg by mouth daily.     . clonazePAM (KLONOPIN) 0.5 MG tablet Take 1 tablet (0.5 mg total) by mouth at bedtime as needed for anxiety. 06/19/2016: Has on hand; has not been taking  . diltiazem (CARDIZEM CD) 180 MG 24 hr capsule Take 1 capsule (180 mg total) by mouth daily.   Marland Kitchen escitalopram (LEXAPRO) 20 MG tablet Take 20 mg by mouth daily.     . fluconazole (DIFLUCAN) 150 MG tablet Take 150 mg by mouth once a week.    . Fluticasone-Salmeterol (ADVAIR) 250-50 MCG/DOSE  AEPB Inhale into the lungs 2 (two) times daily.    Marland Kitchen ibuprofen (ADVIL,MOTRIN) 200 MG tablet Take 400 mg by mouth 2 (two) times daily as needed for mild pain or moderate pain.   Marland Kitchen ipratropium-albuterol (DUONEB) 0.5-2.5 (3) MG/3ML SOLN Take 3 mLs by nebulization 3 (three) times daily. 06/27/2016: Not using it 3 times daily  . montelukast (SINGULAIR) 10 MG tablet Take 1 tablet (10 mg total) by mouth at bedtime.   . Multiple Vitamins-Minerals (MULTIVITAMINS THER. W/MINERALS) TABS  Take 1 tablet by mouth daily.     Marland Kitchen oxybutynin (DITROPAN) 5 MG tablet Take 5 mg by mouth daily.  09/08/2013: Received from: External Pharmacy  . roflumilast (DALIRESP) 500 MCG TABS tablet Take 500 mcg by mouth daily.     Marland Kitchen SPIRIVA RESPIMAT 2.5 MCG/ACT AERS Inhale 1 puff into the lungs daily.  11/14/2014: Received from: External Pharmacy  . traMADol (ULTRAM) 50 MG tablet Take 50 mg by mouth every 6 (six) hours as needed (for back pain).   . triamcinolone (NASACORT ALLERGY 24HR) 55 MCG/ACT AERO nasal inhaler Place 2 sprays into the nose daily as needed (FOR ALLERGIES). 06/19/2016: prn   No facility-administered encounter medications on file as of 11/07/2016.     Functional Status:   In your present state of health, do you have any difficulty performing the following activities: 11/28/2016 09/17/2016  Hearing? N N  Vision? N N  Difficulty concentrating or making decisions? N N  Walking or climbing stairs? N N  Dressing or bathing? N N  Doing errands, shopping? N N  Preparing Food and eating ? N -  Using the Toilet? N -  In the past six months, have you accidently leaked urine? N -  Do you have problems with loss of bowel control? N -  Managing your Medications? N -  Managing your Finances? N -  Housekeeping or managing your Housekeeping? N -  Some recent data might be hidden    Fall/Depression Screening:    Fall Risk  11/28/2016 09/17/2016  Falls in the past year? No No  Risk for fall due to : Impaired balance/gait Impaired balance/gait   PHQ 2/9 Scores 11/28/2016 01/16/2012  PHQ - 2 Score 0 1    Assessment:    Today [name] inquired if [facility] CM thought she has any s/s of cushing syndrome THN CM and [name] reviewed the s/s of Cushing syndrome. Excessive sweating, fatty tissue of abdomen, weight gain, easy to bruise, hunger, weakness are the symptoms she confirmed she is having. Referred her to her pcp for further discussion and testing  Her prednisone is being weaned at a rate of 1 mg a  month On 11/06/16 she started taking Prednisone at 8 mg In June she was on Prednisone 10 mg    COPD/Respiratory s/s--THN CM noted left side expiratory wheezing and coughing during home visit Cynthia Vivirito reports increase fluid intake and use of mucinex Cynthia Kempski also takes Singulair and  zyrtec  THN CM and Cynthia Jesser consulted via telephone Caryn Bee, [facility] pharmacist, on the best time to take zyrtec and was informed it could be taken anytime     Cynthia Armbrecht reports lower back pain that "comes and go" at a pain score of 7 on as scale of 0-10 when 10 is the most extreme pain tolerable   Cynthia Hartlieb experience an episode of increased warmth prior to her temperature being taken.  She began to remove her jacket. Her temperature was found to be  99.2 orally.  She also reports continued episodes of increased body temperature at night  Plan: Cynthia Kaye will be seen in 3 weeks for follow up [facility] CM home visit Route notes to medical providers [facility] CM Care Plan Problem One     Most Recent Value  Care Plan Problem One  Knowledge Deficits related to new plan of care for long term self health management of COPD, abdominal pain and tachycardia  Role Documenting the Problem One  Care Management Coordinator  Care Plan for Problem One  Active  THN Long Term Goal   Over the next 31 days, patient will verbalize understanding of plan of care for long term self health mangaement of COPD, tachycardia as established by pulmonologist/PCP  THN Long Term Goal Start Date  08/13/16  [facility] Long Term Goal Met Date  09/17/16  Interventions for Problem One Long Term Goal  reviewed COPD zone symptoms to report to pcp/pulmonologist, provded [facility] Calendar including list of zone symptoms, consult with pulmonologist/pcp discussed tachycardia  THN CM Short Term Goal #1   Over the next 30 days, patient will verbalize understanding of when to call provider for worsening symptoms  THN CM Short Term Goal #1 Start Date  08/13/16  [facility] CM Short Term Goal #1  Met Date  09/17/16  Interventions for Short Term Goal #1  review of yellow and red zone symptoms, provided list of symptoms  THN CM Short Term Goal #2   Over the next 30 days, patient will verablize understanding of plan of care for management of abdominal pain  THN CM Short Term Goal #2 Start Date  08/13/16  [facility] CM Short Term Goal #2 Met Date  09/17/16  Interventions for Short Term Goal #2  assess symptoms, review  treatment plan options, re evaluate for improve,ments  THN CM Short Term Goal #3  over the next 30 days patient will be able to get answers to questions about completed test results from pcp and specialists  [facility] CM Short Term Goal #3 Start Date  09/17/16  [facility] CM Short Term Goal #3 Met Date  10/24/16  Interventions for Short Tern Goal #3  review test results, answer questions, educate and provide resources as needed        [place] L. Noelle Penner, RN, BSN, CCM [facility] Care Management [phone]

## 2016-12-17 DIAGNOSIS — Z1283 Encounter for screening for malignant neoplasm of skin: Secondary | ICD-10-CM | POA: Diagnosis not present

## 2016-12-17 DIAGNOSIS — Z08 Encounter for follow-up examination after completed treatment for malignant neoplasm: Secondary | ICD-10-CM | POA: Diagnosis not present

## 2016-12-17 DIAGNOSIS — Z85828 Personal history of other malignant neoplasm of skin: Secondary | ICD-10-CM | POA: Diagnosis not present

## 2016-12-17 DIAGNOSIS — L820 Inflamed seborrheic keratosis: Secondary | ICD-10-CM | POA: Diagnosis not present

## 2016-12-26 ENCOUNTER — Other Ambulatory Visit: Payer: Self-pay | Admitting: *Deleted

## 2016-12-26 NOTE — Patient Outreach (Signed)
[facility]) Care Management   12/26/2016  [name] [birth year] [id number]  [name] is an 61 y.o. female with hx of severe COPD, coronary artery disease with history of supraventricular tachyarrhythmias, Hyperlipidemia [name] had been admitted to [facility] skilled nursing facility (snf) for rehab on 05/28/16 after being discharged from the hospital (admission on 05/06/16 for hypoxic and hypercapnic respiratory failure/COPD). She was seen by [facility] snffacility liaison and referred to [facility] community care management. Several unsuccessful attempts to contact [name] after her initial [facility] CM home visit was completed. [name] reached out to [facility] main office after unsuccessful contact attempts and case closure. This [facility] CM staff re engaged with Cynthia Antis and reviewed [facility] services. [facility] CM is seeing [name] today for a [facility] CM home follow up visit  Subjective: "I now waddle" "I am now fatter" "I am having a lot of trouble sleeping.  I nap and wake" She thinks she may get a full 5  "hours"  Objective:   BP 116/64   Pulse 95   Temp 98.6 F (37 C) (Oral)   Resp 20   Ht 1.6 m (5\' 3" )   Wt 136 lb (61.7 kg)   SpO2 97%   BMI 24.09 kg/m   Review of Systems  Respiratory: Positive for cough, sputum production and wheezing.         More congestion of left side   Musculoskeletal: Positive for back pain and joint pain. Negative for falls, myalgias and neck pain.  Skin:       Various red bruises on arms and top of feet  More on left arms from wrist to upper arm  Only bruises noted on right forrarm   Endo/Heme/Allergies: Bruises/bleeds easily.  Psychiatric/Behavioral: Negative.  Negative for depression, hallucinations, memory loss, substance abuse and suicidal ideas. The patient is not nervous/anxious and does not have insomnia.     Physical Exam  Constitutional: She is oriented to person, place, and time. She appears well-developed and well-nourished.  HENT:  Head:  Normocephalic and atraumatic.  Eyes: Conjunctivae are normal. Pupils are equal, round, and reactive to light.  Neck: Normal range of motion. Neck supple.  Cardiovascular: Normal rate and regular rhythm.  Musculoskeletal: She exhibits tenderness.  Neurological: She is alert and oriented to person, place, and time.  Skin: Skin is warm and dry.  Psychiatric: She has a normal mood and affect. Her behavior is normal. Judgment and thought content normal.    Encounter Medications:   Outpatient Encounter Prescriptions as of 12/26/2016  Medication Sig Note  . acyclovir (ZOVIRAX) 800 MG tablet    . albuterol (PROAIR HFA) 108 (90 BASE) MCG/ACT inhaler Inhale 2 puffs into the lungs every 6 (six) hours as needed. For shortness of breath   . ALPRAZolam (XANAX) 0.25 MG tablet Take 0.25 mg by mouth 2 (two) times daily as needed for anxiety.   Marland Kitchen aspirin 81 MG tablet Take 81 mg by mouth daily.   Marland Kitchen atorvastatin (LIPITOR) 40 MG tablet Take 1 tablet (40 mg total) by mouth daily. (Patient taking differently: Take 40 mg by mouth every evening. )   . azithromycin (ZITHROMAX) 250 MG tablet Take by mouth daily. 08/13/2016: Taking MW F   . cetirizine (ZYRTEC) 10 MG tablet Take 10 mg by mouth daily.     . clonazePAM (KLONOPIN) 0.5 MG tablet Take 1 tablet (0.5 mg total) by mouth at bedtime as needed for anxiety. 06/19/2016: Has on hand; has not been  taking  . diltiazem (CARDIZEM CD) 180 MG 24 hr capsule Take 1 capsule (180 mg total) by mouth daily.   Marland Kitchen escitalopram (LEXAPRO) 20 MG tablet Take 20 mg by mouth daily.     . fluconazole (DIFLUCAN) 150 MG tablet Take 150 mg by mouth once a week.    . Fluticasone-Salmeterol (ADVAIR) 250-50 MCG/DOSE AEPB Inhale into the lungs 2 (two) times daily.    Marland Kitchen guaiFENesin (MUCINEX) 600 MG 12 hr tablet Take 600 mg by mouth as needed.   Marland Kitchen ibuprofen (ADVIL,MOTRIN) 200 MG tablet Take 400 mg by mouth 2 (two) times daily as needed for mild pain or moderate pain.   Marland Kitchen ipratropium-albuterol  (DUONEB) 0.5-2.5 (3) MG/3ML SOLN Take 3 mLs by nebulization 3 (three) times daily. 06/27/2016: Not using it 3 times daily  . montelukast (SINGULAIR) 10 MG tablet Take 1 tablet (10 mg total) by mouth at bedtime.   . Multiple Vitamins-Minerals (MULTIVITAMINS THER. W/MINERALS) TABS Take 1 tablet by mouth daily.     Marland Kitchen oxybutynin (DITROPAN) 5 MG tablet Take 5 mg by mouth daily.  09/08/2013: Received from: External Pharmacy  . predniSONE (DELTASONE) 5 MG tablet Take 5 mg by mouth daily. As of November 06 2016 started on prednisone 8 mg po , Tapering prednisone by 1 mg every 3-4  Weeks, Presently taking 5 mg tab and 3 - 1 mg tabs   . roflumilast (DALIRESP) 500 MCG TABS tablet Take 500 mcg by mouth daily.     Marland Kitchen SPIRIVA RESPIMAT 2.5 MCG/ACT AERS Inhale 1 puff into the lungs daily.  11/14/2014: Received from: External Pharmacy  . traMADol (ULTRAM) 50 MG tablet Take 50 mg by mouth every 6 (six) hours as needed (for back pain).   . triamcinolone (NASACORT ALLERGY 24HR) 55 MCG/ACT AERO nasal inhaler Place 2 sprays into the nose daily as needed (FOR ALLERGIES). 06/19/2016: prn   No facility-administered encounter medications on file as of 12/26/2016.     Functional Status:   In your present state of health, do you have any difficulty performing the following activities: 11/28/2016 09/17/2016  Hearing? N N  Vision? N N  Difficulty concentrating or making decisions? N N  Walking or climbing stairs? N N  Dressing or bathing? N N  Doing errands, shopping? N N  Preparing Food and eating ? N -  Using the Toilet? N -  In the past six months, have you accidently leaked urine? N -  Do you have problems with loss of bowel control? N -  Managing your Medications? N -  Managing your Finances? N -  Housekeeping or managing your Housekeeping? N -  Some recent data might be hidden    Fall/Depression Screening:    Fall Risk  11/28/2016 09/17/2016  Falls in the past year? No No  Risk for fall due to : Impaired balance/gait  Impaired balance/gait   PHQ 2/9 Scores 11/28/2016 01/16/2012  PHQ - 2 Score 0 1    Assessment:   Discuss her respiratory concerns She reports being out of breath with washing dishing  More issues with movements if still less sob   Was seeing Dr Delia Heady in [facility] for copd 8-10 yrs before she started seeing Dr Juanetta Gosling and has been with Dr Juanetta Gosling since last hospitalization in January 2018 ?last PFT with Dr [name]   GI/Weight No GI meds in long time  Discussed weight loss plans and offered samples meal and diet  Medications- Prednisone is at 7 mg now and she  reports feeling "tired, worn out, bones and muscles ache" at intervals but not constant "still have fevers" Recently saw her dermatologist and had a shot of prednisone in a chest area for a keloid  Discussed this use of Steroid while tapering offer her present prednisone Has various keloids removed       Mobility "Right knee givens out" Noted also more back pain    Social- Sister, Alcario Drought, came to visit to bring groceries Cm encouraged her to have social activities but she can not recall the last time she went on a vacation.  She recalls the use of [employer] portable oxygen throughout the trip She reports using 4-7 L of oxygen  Encouraged her to consider trips with a call to [employer]   COPD education- Discussed COPD plan of care She reports she learned most of her education at [place] on her particular COPD plan  She is aware of her particular iIrritants such as dye, cleaners and perfumes   Denies stress or depression    Plan: She continues to do well with meeting her [facility] goals  Follow up with Cynthia Denny in 2-4 weeks  Route note to providers   [place] L. Noelle Penner, RN, BSN, CCM [facility] Care Management [phone]

## 2017-01-13 DIAGNOSIS — J449 Chronic obstructive pulmonary disease, unspecified: Secondary | ICD-10-CM | POA: Diagnosis not present

## 2017-01-13 DIAGNOSIS — J9611 Chronic respiratory failure with hypoxia: Secondary | ICD-10-CM | POA: Diagnosis not present

## 2017-01-13 DIAGNOSIS — R000 Tachycardia, unspecified: Secondary | ICD-10-CM | POA: Diagnosis not present

## 2017-01-13 DIAGNOSIS — I10 Essential (primary) hypertension: Secondary | ICD-10-CM | POA: Diagnosis not present

## 2017-01-17 ENCOUNTER — Other Ambulatory Visit: Payer: Self-pay | Admitting: *Deleted

## 2017-01-17 NOTE — Patient Outreach (Signed)
[place] [facility]) Care Management  01/17/2017  JALONDA ANTIGUA [birth year] [id number]   Care coordination  [facility] CM received a email from covering [facility] CM that an order for labs were faxed to [facility] main office and [facility] in basket message noted for 01/13/17  [facility] CM spoke with Dr Luan Pulling' office manger Becky about the ordered labs during this [facility] CM absence.  Becky reports receiving [facility] CM out of office mobile message and faxing order to [place] main office. Pt had not been sent to local lab.  [facility] CM called and spoke with Judson Roch, Advanced home care nurse 01/16/17 1402 about possible assist with labs after consulting with previous covering [place] CM.  Judson Roch confirms Mrs Gierke was discharged from [place] home care services "about a month ago"  [facility] CM called and left a message for Mrs Marchant requesting at return call to [facility] CM mobile number at [phone] 01/16/17  [facility] CM received a return call from Mrs Gerbino at 1916 01/16/17 after she reports she had awaken from a nap.  [facility] CM reviewed with her that Dr Luan Pulling' office had ordered lab work that had been sent to [place] office on 01/13/17 via fax in the absence of this [facility] CM   [facility] CM and Mrs Sirianni discussed the upcoming inclement weather- Claudie Revering.  Mrs Lemaire agreed to lab draw on next week   Plans [facility] CM will see Mrs Devins on next week for labs and home visit   [place]. Lavina Hamman, RN, BSN, [place] Care Management [phone]

## 2017-01-20 ENCOUNTER — Other Ambulatory Visit: Payer: Self-pay | Admitting: *Deleted

## 2017-01-20 NOTE — Patient Outreach (Signed)
[place] [facility]) Care Management  01/20/2017  [name] 1955-12-15 [id number]   Care coordination   [facility] CM contacted [name] to stop by to do her ordered labs.  [name] informs [facility] CM she is about to go rest to take a nap.  [name] and [facility] CM discussed her labs and preference is to get labs during this week already scheduled home visit.  "I can wait until Thursday   Plans To see [name] for scheduled [facility] CM home visit on Thursday January 23 2017   [name] L. Lavina Hamman, RN, BSN, [place] Care Management [phone]

## 2017-01-23 ENCOUNTER — Other Ambulatory Visit: Payer: Self-pay | Admitting: *Deleted

## 2017-01-23 NOTE — Patient Outreach (Signed)
[facility]) Care Management   01/23/2017  MURANDA KLAES 1955-12-07 [id number]  LITZY CASALETTO is an 61 y.o. female hx of severe COPD, coronary artery disease with history of supraventricular tachyarrhythmias, Hyperlipidemia Mrs Milnor had been admitted to [place] skilled nursing facility (snf) for rehab on 05/28/16 after being discharged from the hospital (admission on 05/06/16 for hypoxic and hypercapnic respiratory failure/COPD). She was seen by [facility] snffacility liaison and referred to [facility] community care management. Several unsuccessful attempts to contact Mrs Haberland after her initial [facility] CM home visit was completed. Mrs Bousquet reached out to [facility] main office after unsuccessful contact attempts and case closure. This [facility] CM staff re engaged with Mrs Hoe and reviewed [facility] services. [facility] CM is seeing Mrs Baldassarre today for a [facility] CM home follow up visit   Subjective:  BP 112/78   Pulse 91   Temp 98.7 F (37.1 C) (Oral)   Resp 20   SpO2 98%   Objective:   Review of Systems  Constitutional: Positive for malaise/fatigue.  HENT: Positive for congestion.   Eyes: Negative.   Respiratory: Positive for shortness of breath and wheezing. Negative for cough, hemoptysis and sputum production.   Cardiovascular: Positive for palpitations.  Gastrointestinal: Negative.   Genitourinary: Negative.   Musculoskeletal: Positive for back pain. Negative for falls.       Lower back and shoulder   Skin: Negative.   Neurological: Positive for tremors and weakness. Negative for dizziness, tingling, sensory change, speech change, focal weakness, seizures, loss of consciousness and headaches.  Endo/Heme/Allergies: Bruises/bleeds easily.  Psychiatric/Behavioral: Negative for depression, hallucinations, memory loss, substance abuse and suicidal ideas. The patient is not nervous/anxious and does not have insomnia.     Physical Exam  Constitutional: She is oriented to person, place, and time. She  appears well-developed and well-nourished.  HENT:  Head: Normocephalic and atraumatic.  Eyes: Conjunctivae are normal. Pupils are equal, round, and reactive to light.  Neck: Normal range of motion. Neck supple.  Cardiovascular: Normal rate.  Respiratory: Effort normal and breath sounds normal.  GI: Soft. Bowel sounds are normal.  Musculoskeletal: Normal range of motion.  Neurological: She is alert and oriented to person, place, and time.  Skin: Skin is warm and dry. There is erythema.  Psychiatric: She has a normal mood and affect. Her behavior is normal. Judgment and thought content normal.    Encounter Medications:   Outpatient Encounter Prescriptions as of 01/23/2017  Medication Sig Note  . acyclovir (ZOVIRAX) 800 MG tablet    . albuterol (PROAIR HFA) 108 (90 BASE) MCG/ACT inhaler Inhale 2 puffs into the lungs every 6 (six) hours as needed. For shortness of breath   . ALPRAZolam (XANAX) 0.25 MG tablet Take 0.25 mg by mouth 2 (two) times daily as needed for anxiety.   Marland Kitchen aspirin 81 MG tablet Take 81 mg by mouth daily.   Marland Kitchen atorvastatin (LIPITOR) 40 MG tablet Take 1 tablet (40 mg total) by mouth daily. (Patient taking differently: Take 40 mg by mouth every evening. )   . azithromycin (ZITHROMAX) 250 MG tablet Take by mouth daily. 08/13/2016: Taking MW F   . cetirizine (ZYRTEC) 10 MG tablet Take 10 mg by mouth daily.     . clonazePAM (KLONOPIN) 0.5 MG tablet Take 1 tablet (0.5 mg total) by mouth at bedtime as needed for anxiety. 06/19/2016: Has on hand; has not been taking  . diltiazem (CARDIZEM CD) 180 MG 24 hr capsule Take 1 capsule (180 mg total)  by mouth daily.   Marland Kitchen escitalopram (LEXAPRO) 20 MG tablet Take 20 mg by mouth daily.     . fluconazole (DIFLUCAN) 150 MG tablet Take 150 mg by mouth once a week.    . Fluticasone-Salmeterol (ADVAIR) 250-50 MCG/DOSE AEPB Inhale into the lungs 2 (two) times daily.    Marland Kitchen guaiFENesin (MUCINEX) 600 MG 12 hr tablet Take 600 mg by mouth as needed.   Marland Kitchen  ibuprofen (ADVIL,MOTRIN) 200 MG tablet Take 400 mg by mouth 2 (two) times daily as needed for mild pain or moderate pain.   Marland Kitchen ipratropium-albuterol (DUONEB) 0.5-2.5 (3) MG/3ML SOLN Take 3 mLs by nebulization 3 (three) times daily. 06/27/2016: Not using it 3 times daily  . montelukast (SINGULAIR) 10 MG tablet Take 1 tablet (10 mg total) by mouth at bedtime.   . Multiple Vitamins-Minerals (MULTIVITAMINS THER. W/MINERALS) TABS Take 1 tablet by mouth daily.     Marland Kitchen oxybutynin (DITROPAN) 5 MG tablet Take 5 mg by mouth daily.  09/08/2013: Received from: External Pharmacy  . predniSONE (DELTASONE) 5 MG tablet Take 5 mg by mouth daily. As of November 06 2016 started on prednisone 8 mg po , Tapering prednisone by 1 mg every 3-4  Weeks, Presently taking 5 mg tab and 3 - 1 mg tabs   . roflumilast (DALIRESP) 500 MCG TABS tablet Take 500 mcg by mouth daily.     Marland Kitchen SPIRIVA RESPIMAT 2.5 MCG/ACT AERS Inhale 1 puff into the lungs daily.  11/14/2014: Received from: External Pharmacy  . traMADol (ULTRAM) 50 MG tablet Take 50 mg by mouth every 6 (six) hours as needed (for back pain).   . triamcinolone (NASACORT ALLERGY 24HR) 55 MCG/ACT AERO nasal inhaler Place 2 sprays into the nose daily as needed (FOR ALLERGIES). 06/19/2016: prn   No facility-administered encounter medications on file as of 01/23/2017.     Functional Status:   In your present state of health, do you have any difficulty performing the following activities: 11/28/2016 09/17/2016  Hearing? N N  Vision? N N  Difficulty concentrating or making decisions? N N  Walking or climbing stairs? N N  Dressing or bathing? N N  Doing errands, shopping? N N  Preparing Food and eating ? N -  Using the Toilet? N -  In the past six months, have you accidently leaked urine? N -  Do you have problems with loss of bowel control? N -  Managing your Medications? N -  Managing your Finances? N -  Housekeeping or managing your Housekeeping? N -  Some recent data might be hidden     Fall/Depression Screening:    Fall Risk  11/28/2016 09/17/2016  Falls in the past year? No No  Risk for fall due to : Impaired balance/gait Impaired balance/gait   PHQ 2/9 Scores 11/28/2016 01/16/2012  PHQ - 2 Score 0 1    Assessment:   Met Mrs Montano in her kitchen at her table today   COPD CM noted audible wheezing and auscultated bilateral posterior wheezing   tremors noted initially but resolved prior to the end of the visit. She reports weak muscles but had also been active prior to the home visit  Medications now on prednisone 6 mg was on 10 mg   Skin bruises noted bilateral upper extremities with the left arm noted with more bruising She confirms she generally get "lots of it in my sleep" Reports active moving during sleep She is able to point out two bruises she noted after awakening from  sleeping on her hand    Palpitations/tachycardia.She reports holding her left arm and upper chest pain and some  Palpitations/tachycardia. She called pcp and discussed and he encouraged her to get labs but  "I just did not feel like going to lab" She reports heart rates in the 90s (tachycardia) "normal for me" She states normal for her is at high 80 to low 90s Today she was 91.   CM did educate her on normal adult heart rates 60-80 and discussed tachycardia.  Plan:  Follow up in 2-4 weeks  Route note to pcp   Kimberly L. Noelle Penner, RN, BSN, CCM [facility] Care Management [phone]

## 2017-01-24 DIAGNOSIS — J449 Chronic obstructive pulmonary disease, unspecified: Secondary | ICD-10-CM | POA: Diagnosis not present

## 2017-01-24 DIAGNOSIS — R000 Tachycardia, unspecified: Secondary | ICD-10-CM | POA: Diagnosis not present

## 2017-01-24 DIAGNOSIS — I10 Essential (primary) hypertension: Secondary | ICD-10-CM | POA: Diagnosis not present

## 2017-02-20 ENCOUNTER — Other Ambulatory Visit: Payer: Self-pay | Admitting: *Deleted
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
## 2017-02-27 ENCOUNTER — Other Ambulatory Visit (HOSPITAL_COMMUNITY): Payer: Self-pay | Admitting: Pulmonary Disease

## 2017-02-27 DIAGNOSIS — F321 Major depressive disorder, single episode, moderate: Secondary | ICD-10-CM | POA: Diagnosis not present

## 2017-02-27 DIAGNOSIS — J449 Chronic obstructive pulmonary disease, unspecified: Secondary | ICD-10-CM | POA: Diagnosis not present

## 2017-02-27 DIAGNOSIS — Z23 Encounter for immunization: Secondary | ICD-10-CM | POA: Diagnosis not present

## 2017-02-27 DIAGNOSIS — R109 Unspecified abdominal pain: Secondary | ICD-10-CM

## 2017-02-27 DIAGNOSIS — J9611 Chronic respiratory failure with hypoxia: Secondary | ICD-10-CM | POA: Diagnosis not present

## 2017-02-27 DIAGNOSIS — R911 Solitary pulmonary nodule: Secondary | ICD-10-CM

## 2017-02-27 DIAGNOSIS — I10 Essential (primary) hypertension: Secondary | ICD-10-CM | POA: Diagnosis not present

## 2017-03-03 ENCOUNTER — Other Ambulatory Visit (HOSPITAL_COMMUNITY): Payer: Self-pay | Admitting: Pulmonary Disease

## 2017-03-03 ENCOUNTER — Ambulatory Visit (HOSPITAL_COMMUNITY)
Admission: RE | Admit: 2017-03-03 | Discharge: 2017-03-03 | Disposition: A | Discharge status: Home / Self Care | Payer: Medicare Other | Source: Ambulatory Visit | Attending: Pulmonary Disease | Admitting: Pulmonary Disease

## 2017-03-03 DIAGNOSIS — J439 Emphysema, unspecified: Secondary | ICD-10-CM | POA: Insufficient documentation

## 2017-03-03 DIAGNOSIS — Q433 Congenital malformations of intestinal fixation: Secondary | ICD-10-CM | POA: Diagnosis not present

## 2017-03-03 DIAGNOSIS — R911 Solitary pulmonary nodule: Secondary | ICD-10-CM

## 2017-03-03 DIAGNOSIS — I700 Atherosclerosis of aorta: Secondary | ICD-10-CM | POA: Insufficient documentation

## 2017-03-03 DIAGNOSIS — K5730 Diverticulosis of large intestine without perforation or abscess without bleeding: Secondary | ICD-10-CM | POA: Insufficient documentation

## 2017-03-03 DIAGNOSIS — D71 Functional disorders of polymorphonuclear neutrophils: Secondary | ICD-10-CM | POA: Insufficient documentation

## 2017-03-03 DIAGNOSIS — R918 Other nonspecific abnormal finding of lung field: Secondary | ICD-10-CM | POA: Insufficient documentation

## 2017-03-03 DIAGNOSIS — K8020 Calculus of gallbladder without cholecystitis without obstruction: Secondary | ICD-10-CM | POA: Diagnosis not present

## 2017-03-03 DIAGNOSIS — R109 Unspecified abdominal pain: Secondary | ICD-10-CM

## 2017-03-03 DIAGNOSIS — M87852 Other osteonecrosis, left femur: Secondary | ICD-10-CM | POA: Insufficient documentation

## 2017-03-03 DIAGNOSIS — R0602 Shortness of breath: Secondary | ICD-10-CM | POA: Diagnosis not present

## 2017-03-04 ENCOUNTER — Ambulatory Visit: Payer: Medicare Other | Admitting: Cardiology

## 2017-03-06 ENCOUNTER — Encounter (INDEPENDENT_AMBULATORY_CARE_PROVIDER_SITE_OTHER): Payer: Self-pay | Admitting: Internal Medicine

## 2017-03-10 ENCOUNTER — Other Ambulatory Visit: Payer: Self-pay | Admitting: Cardiology

## 2017-03-11 ENCOUNTER — Telehealth: Payer: Self-pay | Admitting: *Deleted

## 2017-03-11 NOTE — Patient Outreach (Signed)
[facility]) Care Management  03/11/2017  [name] 31-Dec-1955 [id number]   Care coordination   [facility] CM received a call from [name] to reschedule her upcoming 03/20/17 1200 appointment to 1400 on 03/20/17 related to having a 03/20/17 1130 appointment with her GI provider  Plan [facility] CM rescheduled appointment to 1400 on 03/20/17 in Epic CM to see [name] next week for home visit  [place]. Lavina Hamman, RN, BSN, [place] Care Management [phone]

## 2017-03-20 ENCOUNTER — Other Ambulatory Visit: Payer: Self-pay

## 2017-03-20 ENCOUNTER — Ambulatory Visit (INDEPENDENT_AMBULATORY_CARE_PROVIDER_SITE_OTHER): Payer: Medicare Other | Admitting: Internal Medicine

## 2017-03-20 ENCOUNTER — Other Ambulatory Visit: Payer: Self-pay | Admitting: *Deleted

## 2017-03-20 ENCOUNTER — Ambulatory Visit: Payer: Self-pay | Admitting: *Deleted

## 2017-03-20 ENCOUNTER — Encounter (INDEPENDENT_AMBULATORY_CARE_PROVIDER_SITE_OTHER): Payer: Self-pay | Admitting: *Deleted

## 2017-03-20 ENCOUNTER — Encounter (INDEPENDENT_AMBULATORY_CARE_PROVIDER_SITE_OTHER): Payer: Self-pay | Admitting: Internal Medicine

## 2017-03-20 VITALS — BP 130/60 | HR 84 | Temp 98.0°F | Ht 63.0 in | Wt 144.8 lb

## 2017-03-20 DIAGNOSIS — R110 Nausea: Secondary | ICD-10-CM

## 2017-03-20 DIAGNOSIS — K625 Hemorrhage of anus and rectum: Secondary | ICD-10-CM

## 2017-03-20 DIAGNOSIS — Z86010 Personal history of colonic polyps: Secondary | ICD-10-CM

## 2017-03-20 DIAGNOSIS — K5732 Diverticulitis of large intestine without perforation or abscess without bleeding: Secondary | ICD-10-CM

## 2017-03-20 MED ORDER — ONDANSETRON HCL 4 MG PO TABS: 4.0000 mg | ORAL_TABLET | Freq: Three times a day (TID) | ORAL | 0 refills | Status: DC | PRN

## 2017-03-20 NOTE — Patient Outreach (Signed)
[facility]) Care Management   03/20/2017  SIDRAH HARDEN [birth year] [id number]  FALINE LANGER is an 61 y.o. female hx of severe COPD, coronary artery disease with history of supraventricular tachyarrhythmias, Hyperlipidemia Mrs Andrew had been admitted to [place] skilled nursing facility (snf) for rehab on 05/28/16 after being discharged from the hospital (admission on 05/06/16 for hypoxic and hypercapnic respiratory failure/COPD). She was seen by [facility] snffacility liaison and referred to [place] care management. Several unsuccessful attempts to contact Mrs Hollenback after her initial [place] home visit was completed. Mrs Ortman reached out to [facility] main office after unsuccessful contact attempts and case closure. This [facility] CM staff re engaged with Mrs Tedrick and reviewed [facility] services. [facility] CM is seeing Mrs Chou today for a [facility] CM home follow up visit  Subjective: see notes below   Objective:   BP 124/68   Pulse 67   Temp 98 F (36.7 C) (Oral)   Resp 20   SpO2 97%   Review of Systems  Constitutional: Negative for fever.  HENT: Negative.   Eyes: Negative.   Respiratory: Negative.   Cardiovascular: Negative.   Gastrointestinal: Positive for abdominal pain, blood in stool and nausea.  Genitourinary: Negative.   Musculoskeletal: Positive for back pain.  Skin: Negative.   Neurological: Positive for tremors and weakness.  Endo/Heme/Allergies: Bruises/bleeds easily.  Psychiatric/Behavioral: Negative.     Physical Exam  Constitutional: She is oriented to person, place, and time. She appears well-developed and well-nourished.  HENT:  Head: Normocephalic and atraumatic.  Eyes: Pupils are equal, round, and reactive to light.  Neck: Normal range of motion. Neck supple.  Cardiovascular: Normal rate and regular rhythm.  Respiratory: Effort normal and breath sounds normal.  GI: Soft. Bowel sounds are normal. There is tenderness.  Musculoskeletal: Normal range of motion.   Neurological: She is alert and oriented to person, place, and time.  Skin: Skin is warm and dry.  Psychiatric: She has a normal mood and affect. Her behavior is normal. Judgment and thought content normal.    Encounter Medications:   Outpatient Encounter Medications as of 03/20/2017  Medication Sig Note  . acyclovir (ZOVIRAX) 800 MG tablet    . albuterol (PROAIR HFA) 108 (90 BASE) MCG/ACT inhaler Inhale 2 puffs into the lungs every 6 (six) hours as needed. For shortness of breath   . ALPRAZolam (XANAX) 0.25 MG tablet Take 0.25 mg by mouth 2 (two) times daily as needed for anxiety.   Marland Kitchen aspirin 81 MG tablet Take 81 mg by mouth daily.   Marland Kitchen atorvastatin (LIPITOR) 40 MG tablet TAKE (1) TABLET BY MOUTH ONCE DAILY.   Marland Kitchen azithromycin (ZITHROMAX) 250 MG tablet Take by mouth daily. 08/13/2016: Taking MW F   . cetirizine (ZYRTEC) 10 MG tablet Take 10 mg by mouth daily.     . clonazePAM (KLONOPIN) 0.5 MG tablet Take 1 tablet (0.5 mg total) by mouth at bedtime as needed for anxiety. 06/19/2016: Has on hand; has not been taking  . diltiazem (CARDIZEM CD) 180 MG 24 hr capsule Take 1 capsule (180 mg total) by mouth daily.   Marland Kitchen escitalopram (LEXAPRO) 20 MG tablet Take 20 mg by mouth daily.     . Fluticasone-Salmeterol (ADVAIR) 250-50 MCG/DOSE AEPB Inhale into the lungs 2 (two) times daily.    Marland Kitchen guaiFENesin (MUCINEX) 600 MG 12 hr tablet Take 600 mg by mouth as needed.   Marland Kitchen ibuprofen (ADVIL,MOTRIN) 200 MG tablet Take 400 mg by mouth 2 (two) times  [facility]) Care Management   03/20/2017  SIDRAH HARDEN [birth year] [id number]  FALINE LANGER is an 61 y.o. female hx of severe COPD, coronary artery disease with history of supraventricular tachyarrhythmias, Hyperlipidemia Mrs Andrew had been admitted to [place] skilled nursing facility (snf) for rehab on 05/28/16 after being discharged from the hospital (admission on 05/06/16 for hypoxic and hypercapnic respiratory failure/COPD). She was seen by [facility] snffacility liaison and referred to [place] care management. Several unsuccessful attempts to contact Mrs Hollenback after her initial [place] home visit was completed. Mrs Ortman reached out to [facility] main office after unsuccessful contact attempts and case closure. This [facility] CM staff re engaged with Mrs Tedrick and reviewed [facility] services. [facility] CM is seeing Mrs Chou today for a [facility] CM home follow up visit  Subjective: see notes below   Objective:   BP 124/68   Pulse 67   Temp 98 F (36.7 C) (Oral)   Resp 20   SpO2 97%   Review of Systems  Constitutional: Negative for fever.  HENT: Negative.   Eyes: Negative.   Respiratory: Negative.   Cardiovascular: Negative.   Gastrointestinal: Positive for abdominal pain, blood in stool and nausea.  Genitourinary: Negative.   Musculoskeletal: Positive for back pain.  Skin: Negative.   Neurological: Positive for tremors and weakness.  Endo/Heme/Allergies: Bruises/bleeds easily.  Psychiatric/Behavioral: Negative.     Physical Exam  Constitutional: She is oriented to person, place, and time. She appears well-developed and well-nourished.  HENT:  Head: Normocephalic and atraumatic.  Eyes: Pupils are equal, round, and reactive to light.  Neck: Normal range of motion. Neck supple.  Cardiovascular: Normal rate and regular rhythm.  Respiratory: Effort normal and breath sounds normal.  GI: Soft. Bowel sounds are normal. There is tenderness.  Musculoskeletal: Normal range of motion.   Neurological: She is alert and oriented to person, place, and time.  Skin: Skin is warm and dry.  Psychiatric: She has a normal mood and affect. Her behavior is normal. Judgment and thought content normal.    Encounter Medications:   Outpatient Encounter Medications as of 03/20/2017  Medication Sig Note  . acyclovir (ZOVIRAX) 800 MG tablet    . albuterol (PROAIR HFA) 108 (90 BASE) MCG/ACT inhaler Inhale 2 puffs into the lungs every 6 (six) hours as needed. For shortness of breath   . ALPRAZolam (XANAX) 0.25 MG tablet Take 0.25 mg by mouth 2 (two) times daily as needed for anxiety.   Marland Kitchen aspirin 81 MG tablet Take 81 mg by mouth daily.   Marland Kitchen atorvastatin (LIPITOR) 40 MG tablet TAKE (1) TABLET BY MOUTH ONCE DAILY.   Marland Kitchen azithromycin (ZITHROMAX) 250 MG tablet Take by mouth daily. 08/13/2016: Taking MW F   . cetirizine (ZYRTEC) 10 MG tablet Take 10 mg by mouth daily.     . clonazePAM (KLONOPIN) 0.5 MG tablet Take 1 tablet (0.5 mg total) by mouth at bedtime as needed for anxiety. 06/19/2016: Has on hand; has not been taking  . diltiazem (CARDIZEM CD) 180 MG 24 hr capsule Take 1 capsule (180 mg total) by mouth daily.   Marland Kitchen escitalopram (LEXAPRO) 20 MG tablet Take 20 mg by mouth daily.     . Fluticasone-Salmeterol (ADVAIR) 250-50 MCG/DOSE AEPB Inhale into the lungs 2 (two) times daily.    Marland Kitchen guaiFENesin (MUCINEX) 600 MG 12 hr tablet Take 600 mg by mouth as needed.   Marland Kitchen ibuprofen (ADVIL,MOTRIN) 200 MG tablet Take 400 mg by mouth 2 (two) times  [facility]) Care Management   03/20/2017  EOWYN TABONE 1955-07-06 [id number]  THOMASENE DUBOW is an 61 y.o. female hx of severe COPD, coronary artery disease with history of supraventricular tachyarrhythmias, Hyperlipidemia Mrs Lindsley had been admitted to [place] skilled nursing facility (snf) for rehab on 05/28/16 after being discharged from the hospital (admission on 05/06/16 for hypoxic and hypercapnic respiratory failure/COPD). She was seen by [facility] snffacility liaison and referred to [place] care management. Several unsuccessful attempts to contact Mrs Cargo after her initial [place] home visit was completed. Mrs Alden reached out to [facility] main office after unsuccessful contact attempts and case closure. This [facility] CM staff re engaged with Mrs Beamon and reviewed [facility] services. [facility] CM is seeing Mrs Ost today for a [facility] CM home follow up visit  Subjective: see notes below   Objective:   BP 124/68   Pulse 67   Temp 98 F (36.7 C) (Oral)   Resp 20   SpO2 97%   Review of Systems  Constitutional: Negative for fever.  HENT: Negative.   Eyes: Negative.   Respiratory: Negative.   Cardiovascular: Negative.   Gastrointestinal: Positive for abdominal pain, blood in stool and nausea.  Genitourinary: Negative.   Musculoskeletal: Positive for back pain.  Skin: Negative.   Neurological: Positive for tremors and weakness.  Endo/Heme/Allergies: Bruises/bleeds easily.  Psychiatric/Behavioral: Negative.     Physical Exam  Constitutional: She is oriented to person, place, and time. She appears well-developed and well-nourished.  HENT:  Head: Normocephalic and atraumatic.  Eyes: Pupils are equal, round, and reactive to light.  Neck: Normal range of motion. Neck supple.  Cardiovascular: Normal rate and regular rhythm.  Respiratory: Effort normal and breath sounds normal.  GI: Soft. Bowel sounds are normal. There is tenderness.  Musculoskeletal: Normal range of motion.   Neurological: She is alert and oriented to person, place, and time.  Skin: Skin is warm and dry.  Psychiatric: She has a normal mood and affect. Her behavior is normal. Judgment and thought content normal.    Encounter Medications:   Outpatient Encounter Medications as of 03/20/2017  Medication Sig Note  . acyclovir (ZOVIRAX) 800 MG tablet    . albuterol (PROAIR HFA) 108 (90 BASE) MCG/ACT inhaler Inhale 2 puffs into the lungs every 6 (six) hours as needed. For shortness of breath   . ALPRAZolam (XANAX) 0.25 MG tablet Take 0.25 mg by mouth 2 (two) times daily as needed for anxiety.   Marland Kitchen aspirin 81 MG tablet Take 81 mg by mouth daily.   Marland Kitchen atorvastatin (LIPITOR) 40 MG tablet TAKE (1) TABLET BY MOUTH ONCE DAILY.   Marland Kitchen azithromycin (ZITHROMAX) 250 MG tablet Take by mouth daily. 08/13/2016: Taking MW F   . cetirizine (ZYRTEC) 10 MG tablet Take 10 mg by mouth daily.     . clonazePAM (KLONOPIN) 0.5 MG tablet Take 1 tablet (0.5 mg total) by mouth at bedtime as needed for anxiety. 06/19/2016: Has on hand; has not been taking  . diltiazem (CARDIZEM CD) 180 MG 24 hr capsule Take 1 capsule (180 mg total) by mouth daily.   Marland Kitchen escitalopram (LEXAPRO) 20 MG tablet Take 20 mg by mouth daily.     . Fluticasone-Salmeterol (ADVAIR) 250-50 MCG/DOSE AEPB Inhale into the lungs 2 (two) times daily.    Marland Kitchen guaiFENesin (MUCINEX) 600 MG 12 hr tablet Take 600 mg by mouth as needed.   Marland Kitchen ibuprofen (ADVIL,MOTRIN) 200 MG tablet Take 400 mg by mouth 2 (two) times

## 2017-03-20 NOTE — Patient Instructions (Signed)
The risks of bleeding, perforation and infection were reviewed with patient.  

## 2017-03-20 NOTE — Progress Notes (Signed)
Subjective:    Patient ID: [name], female    DOB: 1956-04-04, 61 y.o.   MRN: [id number]  HPI Referred by Dr Juanetta Gosling for abdominal pain. She has nausea, abdominal tenderness. Dull pain left side.  She has seen blood in her stool. She still has some urgency.  She saw Dr Juanetta Gosling for same and put on Cipro and Flagyl. CT scan revealed:  IMPRESSION: 1. Mobile cecum currently in the left upper quadrant, and mildly displacing the stomach. No findings of cecal volvulus. 2. Considerable sigmoid colon diverticulosis over a 10 cm segment with associated wall thickening but without definite active diverticulitis. 3. The previous right basilar ground-glass density pulmonary nodule of concern on the prior exam has resolved. 4. Other imaging findings of potential clinical significance: Aortic Atherosclerosis (ICD10-I70.0) and Emphysema (ICD10-J43.9). Old granulomatous disease. Thoracolumbar scoliosis. Chronic superior endplate compression at T12. Borderline pelvic floor laxity. 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 Cholelithiasis. Borderline enlarged but stable celiac chain lymph node. Old granulomatous disease with numerous scattered calcified pulmonary nodules.     Symptoms are better. She was empirically treated for diverticulitis.  She still see some blood on the toilet paper. Has some abdominal pain which she rates at a 3-4. She has some nausea. No fever. Symptoms started 6 weeks ago. She is 85% better.  Appetite is good. No weight loss.  Stools are formed. Having about 3 stools a day.   Her last colonoscopy was in October of 2012.  Transverse colon polyp was snared piecemeal it is a tubular adenoma. Rectal polyps are hyperplastic. Next colonoscopy in 5 years   Review of Systems     Past Medical History:  Diagnosis Date  . COPD (chronic obstructive pulmonary disease) (HCC)    Home oxygen at [facility]  . Diverticulosis   . History of cardiac  catheterization    No significant CAD 2005  . Hyperlipidemia   . Hypoglycemia   . IBS (irritable bowel syndrome)   . SVT (supraventricular tachycardia) (HCC)     Past Surgical History:  Procedure Laterality Date  . BACK SURGERY    . COLONOSCOPY  03/22/2011   Procedure: COLONOSCOPY;  Surgeon: Malissa Hippo, MD;  [facility];  Service: Endoscopy;  Laterality: N/A;  . SPINAL FUSION      Allergies  Allergen Reactions  . Iohexol Anaphylaxis     Desc: IV DYE  ANAPHYLATIC SHOCK X2 ONCE AFTER PREMEDS   . Chantix [Varenicline] Other (See Comments)    PALPITATIONS  . Ketek [Telithromycin]   . Penicillins Other (See Comments)    unknown    Current Outpatient Medications on File Prior to Visit  Medication Sig Dispense Refill  . ALPRAZolam (XANAX) 0.25 MG tablet Take 0.25 mg by mouth 2 (two) times daily as needed for anxiety.    Marland Kitchen aspirin 81 MG tablet Take 81 mg by mouth daily.    Marland Kitchen atorvastatin (LIPITOR) 40 MG tablet TAKE (1) TABLET BY MOUTH ONCE DAILY. 90 tablet 3  . cetirizine (ZYRTEC) 10 MG tablet Take 10 mg by mouth daily.      . clonazePAM (KLONOPIN) 0.5 MG tablet Take 1 tablet (0.5 mg total) by mouth at bedtime as needed for anxiety. 10 tablet 0  . diltiazem (CARDIZEM CD) 180 MG 24 hr capsule Take 1 capsule (180 mg total) by mouth daily. 90 capsule 3  . escitalopram (LEXAPRO) 20 MG tablet Take 20 mg by mouth daily.      Marland Kitchen  Fluticasone-Salmeterol (ADVAIR) 250-50 MCG/DOSE AEPB Inhale into the lungs 2 (two) times daily.     Marland Kitchen guaiFENesin (MUCINEX) 600 MG 12 hr tablet Take 600 mg by mouth as needed.    Marland Kitchen ibuprofen (ADVIL,MOTRIN) 200 MG tablet Take 400 mg by mouth 2 (two) times daily as needed for mild pain or moderate pain.    Marland Kitchen ipratropium-albuterol (DUONEB) 0.5-2.5 (3) MG/3ML SOLN Take 3 mLs by nebulization 3 (three) times daily. 360 mL 1  . montelukast (SINGULAIR) 10 MG tablet Take 1 tablet (10 mg total) by mouth at bedtime. 30 tablet 1  . Multiple Vitamins-Minerals  (MULTIVITAMINS THER. W/MINERALS) TABS Take 1 tablet by mouth daily.      Marland Kitchen oxybutynin (DITROPAN) 5 MG tablet Take 5 mg by mouth daily.     . predniSONE (DELTASONE) 5 MG tablet Take 5 mg by mouth daily. As of November 06 2016 started on prednisone 8 mg po , Tapering prednisone by 1 mg every 3-4  Weeks, Presently taking 5 mg tab and 3 - 1 mg tabs    . roflumilast (DALIRESP) 500 MCG TABS tablet Take 500 mcg by mouth daily.      . roflumilast (DALIRESP) 500 MCG TABS tablet Take 500 mcg daily by mouth.    . SPIRIVA RESPIMAT 2.5 MCG/ACT AERS Inhale 1 puff into the lungs daily.     . traMADol (ULTRAM) 50 MG tablet Take 50 mg by mouth every 6 (six) hours as needed (for back pain).    . triamcinolone (NASACORT ALLERGY 24HR) 55 MCG/ACT AERO nasal inhaler Place 2 sprays into the nose daily as needed (FOR ALLERGIES).    Marland Kitchen acyclovir (ZOVIRAX) 800 MG tablet     . albuterol (PROAIR HFA) 108 (90 BASE) MCG/ACT inhaler Inhale 2 puffs into the lungs every 6 (six) hours as needed. For shortness of breath    . azithromycin (ZITHROMAX) 250 MG tablet Take by mouth daily.     No current facility-administered medications on file prior to visit.      Objective:   Physical Exam Blood pressure 130/60, [name] 84, temperature 98 F (36.7 C), height 5\' 3"  (1.6 m), weight 144 lb 12.8 oz (65.7 kg). Alert and oriented. Skin warm and dry. Oral mucosa is moist.   . Sclera anicteric, conjunctivae is pink. Thyroid not enlarged. No cervical lymphadenopathy. Lungs clear. Heart regular rate and rhythm.  Abdomen is soft. Bowel sounds are positive. No hepatomegaly. No abdominal masses felt. No tenderness.  No edema to lower extremities.           Assessment & Plan:  Lower abdominal pain, rectal bleeding. Empirically treated for diverticulitis. Colonoscopy to rule out hemorrhoids, colon polyps, colon cancer. (At least 8 weeks out).  Patient is on oxygen and is asking about cpap during procedure

## 2017-04-02 NOTE — Progress Notes (Signed)
Cardiology Office Note  Date: 04/03/2017   ID: ARPI FILARDI, DOB 1955-07-04, MRN [id number]  PCP: Kari Baars, MD  Primary Cardiologist: Nona Dell, MD   Chief Complaint  Patient presents with  . Cardiac follow-up    History of Present Illness: [name] is a 61 y.o. female last seen in May.  She is here today for a follow-up visit with family member.  She states that her feeling of palpitations since last visit is better, but she still has episodes where she feels more short of breath and notices that her heart rate is elevated over baseline.  She reports compliance with her medications including Cardizem CD 180 mg daily.  She continues to follow with Dr. Juanetta Gosling for management of chronic lung disease, oxygen requiring.  Past Medical History:  Diagnosis Date  . COPD (chronic obstructive pulmonary disease) (HCC)    Home oxygen at [facility]  . Diverticulosis   . History of cardiac catheterization    No significant CAD 2005  . Hyperlipidemia   . Hypoglycemia   . IBS (irritable bowel syndrome)   . SVT (supraventricular tachycardia) (HCC)     Past Surgical History:  Procedure Laterality Date  . BACK SURGERY    . COLONOSCOPY  03/22/2011   Procedure: COLONOSCOPY;  Surgeon: Malissa Hippo, MD;  [facility];  Service: Endoscopy;  Laterality: N/A;  . SPINAL FUSION      Current Outpatient Medications  Medication Sig Dispense Refill  . acyclovir (ZOVIRAX) 800 MG tablet     . albuterol (PROAIR HFA) 108 (90 BASE) MCG/ACT inhaler Inhale 2 puffs into the lungs every 6 (six) hours as needed. For shortness of breath    . ALPRAZolam (XANAX) 0.25 MG tablet Take 0.25 mg by mouth 2 (two) times daily as needed for anxiety.    Marland Kitchen aspirin 81 MG tablet Take 81 mg by mouth daily.    Marland Kitchen atorvastatin (LIPITOR) 40 MG tablet TAKE (1) TABLET BY MOUTH ONCE DAILY. 90 tablet 3  . azithromycin (ZITHROMAX) 250 MG tablet Take by mouth daily.    . cetirizine (ZYRTEC) 10 MG tablet  Take 10 mg by mouth daily.      . clonazePAM (KLONOPIN) 0.5 MG tablet Take 1 tablet (0.5 mg total) by mouth at bedtime as needed for anxiety. 10 tablet 0  . diltiazem (CARDIZEM CD) 180 MG 24 hr capsule Take 1 capsule (180 mg total) by mouth daily. 90 capsule 3  . escitalopram (LEXAPRO) 20 MG tablet Take 20 mg by mouth daily.      . Fluticasone-Salmeterol (ADVAIR) 250-50 MCG/DOSE AEPB Inhale into the lungs 2 (two) times daily.     Marland Kitchen guaiFENesin (MUCINEX) 600 MG 12 hr tablet Take 600 mg by mouth as needed.    Marland Kitchen ibuprofen (ADVIL,MOTRIN) 200 MG tablet Take 400 mg by mouth 2 (two) times daily as needed for mild pain or moderate pain.    Marland Kitchen ipratropium-albuterol (DUONEB) 0.5-2.5 (3) MG/3ML SOLN Take 3 mLs by nebulization 3 (three) times daily. 360 mL 1  . montelukast (SINGULAIR) 10 MG tablet Take 1 tablet (10 mg total) by mouth at bedtime. 30 tablet 1  . Multiple Vitamins-Minerals (MULTIVITAMINS THER. W/MINERALS) TABS Take 1 tablet by mouth daily.      . ondansetron (ZOFRAN) 4 MG tablet Take 1 tablet (4 mg total) every 8 (eight) hours as needed by mouth for nausea or vomiting. 30 tablet 0  . oxybutynin (DITROPAN) 5 MG tablet Take 5 mg by  mouth daily.     . predniSONE (DELTASONE) 5 MG tablet Take 5 mg by mouth daily. As of November 06 2016 started on prednisone 8 mg po , Tapering prednisone by 1 mg every 3-4  Weeks, Presently taking 5 mg tab and 3 - 1 mg tabs    . roflumilast (DALIRESP) 500 MCG TABS tablet Take 500 mcg by mouth daily.      . roflumilast (DALIRESP) 500 MCG TABS tablet Take 500 mcg daily by mouth.    . SPIRIVA RESPIMAT 2.5 MCG/ACT AERS Inhale 1 puff into the lungs daily.     . traMADol (ULTRAM) 50 MG tablet Take 50 mg by mouth every 6 (six) hours as needed (for back pain).    . triamcinolone (NASACORT ALLERGY 24HR) 55 MCG/ACT AERO nasal inhaler Place 2 sprays into the nose daily as needed (FOR ALLERGIES).     No current facility-administered medications for this visit.    Allergies:  Iohexol;  Chantix [varenicline]; Ketek [telithromycin]; and Penicillins   Social History: The patient  reports that she quit smoking about 22 months ago. Her smoking use included cigarettes. She has a 40.00 pack-year smoking history. she has never used smokeless tobacco. She reports that she drinks alcohol. She reports that she does not use drugs.   ROS:  Please see the history of present illness. Otherwise, complete review of systems is positive for chronic shortness of breath.  All other systems are reviewed and negative.   Physical Exam: VS:  BP 124/78   Pulse 100   Ht 5\' 3"  (1.6 m)   Wt 145 lb (65.8 kg)   SpO2 95%   BMI 25.69 kg/m , BMI Body mass index is 25.69 kg/m.  Wt Readings from Last 3 Encounters:  04/03/17 145 lb (65.8 kg)  03/20/17 144 lb 12.8 oz (65.7 kg)  12/26/16 136 lb (61.7 kg)    General: Chronically ill-appearing woman wearing oxygen via nasal cannula. HEENT: Conjunctiva and lids normal, oropharynx clear. Neck: Supple, no elevated JVP or carotid bruits, no thyromegaly. Lungs: No active wheezing, nonlabored breathing at rest. Cardiac: Regular rate and rhythm, no S3 or significant systolic murmur, no pericardial rub. Abdomen: Soft, nontender, bowel sounds present. Extremities: No pitting edema, distal pulses 2+.  ECG: I personally reviewed the tracing from 05/21/2016 which showed sinus tachycardia.  Recent Labwork: 05/13/2016: B Natriuretic Peptide 67.0 05/27/2016: Magnesium 1.9 09/11/2016: ALT 21; AST 24; BUN 17; Creatinine, Ser 0.79; Hemoglobin 12.7; Platelets 334; Potassium 3.8; Sodium 139; TSH 2.469   Other Studies Reviewed Today:  Echocardiogram 06/26/2015: Study Conclusions  - Left ventricle: The cavity size was normal. Wall thickness was normal. Systolic function was normal. The estimated ejection fraction was in the range of 60% to 65%. Wall motion was normal; there were no regional wall motion abnormalities. Doppler parameters are consistent with  abnormal left ventricular relaxation (grade 1 diastolic dysfunction). - Left atrium: The atrium was mildly dilated.  Holter monitor 06/26/2015: 24-hour Holter Monitor. Heart rate ranged from 70 bpm up to 137 bpm. Sinus rhythm and sinus tachycardia were present. Rare PACs and PVCs were noted. There were no sustained arrhythmias. No pauses.  Assessment and Plan:  1.  History of palpitations and SVT.  She tends to have a fast heart rate at baseline with chronic lung disease.  We will increase Cardizem CD to 240 mg daily for now.  2.  Severe oxygen dependent COPD.  She continues to follow with Dr. Juanetta Gosling.  Current medicines were reviewed with the  patient today.  Disposition: Follow-up in 6 months.  Signed, Jonelle Sidle, MD, [facility] 04/03/2017 2:01 PM    [facility] at [facility] [address], [place], [place] [address] Phone: [phone]; Fax: [phone]

## 2017-04-03 ENCOUNTER — Encounter: Payer: Self-pay | Admitting: Cardiology

## 2017-04-03 ENCOUNTER — Ambulatory Visit (INDEPENDENT_AMBULATORY_CARE_PROVIDER_SITE_OTHER): Payer: Medicare Other | Admitting: Cardiology

## 2017-04-03 VITALS — BP 124/78 | HR 100 | Ht 63.0 in | Wt 145.0 lb

## 2017-04-03 DIAGNOSIS — R002 Palpitations: Secondary | ICD-10-CM

## 2017-04-03 DIAGNOSIS — J449 Chronic obstructive pulmonary disease, unspecified: Secondary | ICD-10-CM

## 2017-04-03 NOTE — Patient Instructions (Signed)
Your physician wants you to follow-up in: 6 months Dr Ferne Reus will receive a reminder letter in the mail two months in advance. If you don't receive a letter, please call our office to schedule the follow-up appointment.     INCREASE Cardizem to 240 mg daily    If you need a refill on your cardiac medications before your next appointment, please call your pharmacy.      No test ordered today       Thank you for choosing [facility] !

## 2017-04-09 ENCOUNTER — Other Ambulatory Visit (INDEPENDENT_AMBULATORY_CARE_PROVIDER_SITE_OTHER): Payer: Self-pay | Admitting: Internal Medicine

## 2017-04-09 DIAGNOSIS — K5732 Diverticulitis of large intestine without perforation or abscess without bleeding: Secondary | ICD-10-CM

## 2017-04-10 ENCOUNTER — Encounter (INDEPENDENT_AMBULATORY_CARE_PROVIDER_SITE_OTHER): Payer: Self-pay | Admitting: *Deleted

## 2017-04-10 DIAGNOSIS — K5732 Diverticulitis of large intestine without perforation or abscess without bleeding: Secondary | ICD-10-CM | POA: Insufficient documentation

## 2017-04-16 ENCOUNTER — Other Ambulatory Visit: Payer: Self-pay | Admitting: *Deleted

## 2017-04-16 NOTE — Patient Outreach (Signed)
[facility]) Care Management   04/16/2017  DANISA TRAGESSER 01/18/1956 [id number]  DEBBYE TIMBERMAN is an 61 y.o. female  hx of severe COPD, coronary artery disease with history of supraventricular tachyarrhythmias, Hyperlipidemia Mrs Gearhart had been admitted to [facility] for rehab on 05/28/16 after being discharged from the hospital (admission on 05/06/16 for hypoxic and hypercapnic respiratory failure/COPD). She was seen by [facility] snffacility liaison and referred to [facility] community care management. Several unsuccessful attempts to contact Mrs Baby after her initial [facility] CM home visit was completed. Mrs Westwood reached out to [facility] main office after unsuccessful contact attempts and case closure. This [facility] CM staff re engaged with Mrs Sajous and reviewed [facility] services. [facility] CM is seeing Mrs Rund today for a [facility] CM home follow up visit  Subjective:  See notes below   Objective:  BP was 134/78 initially but 120/72 on re take BP 120/72   Pulse 94   Temp 98.4 F (36.9 C) (Oral)   Resp 20   Wt 148 lb (67.1 kg)   SpO2 97%   BMI 26.22 kg/m   Review of Systems  Cardiovascular:       Elevated heart rate to 140 Cardiology aware, Medications changed  Gastrointestinal: Positive for abdominal pain.  Genitourinary: Positive for frequency and urgency.       5-6 times a day formed stools with noted frank blood clots- no straining  Going on for 3-4 months  Neurological: Positive for weakness.    Physical Exam  Encounter Medications:   Outpatient Encounter Medications as of 04/16/2017  Medication Sig Note  . acyclovir (ZOVIRAX) 800 MG tablet    . albuterol (PROAIR HFA) 108 (90 BASE) MCG/ACT inhaler Inhale 2 puffs into the lungs every 6 (six) hours as needed. For shortness of breath   . ALPRAZolam (XANAX) 0.25 MG tablet Take 0.25 mg by mouth 2 (two) times daily as needed for anxiety.   Marland Kitchen aspirin 81 MG tablet Take 81 mg by mouth daily.   Marland Kitchen atorvastatin  (LIPITOR) 40 MG tablet TAKE (1) TABLET BY MOUTH ONCE DAILY.   Marland Kitchen azithromycin (ZITHROMAX) 250 MG tablet Take by mouth daily. 08/13/2016: Taking MW F   . cetirizine (ZYRTEC) 10 MG tablet Take 10 mg by mouth daily.     . clonazePAM (KLONOPIN) 0.5 MG tablet Take 1 tablet (0.5 mg total) by mouth at bedtime as needed for anxiety. 06/19/2016: Has on hand; has not been taking  . diltiazem (CARDIZEM CD) 180 MG 24 hr capsule Take 1 capsule (180 mg total) by mouth daily.   Marland Kitchen escitalopram (LEXAPRO) 20 MG tablet Take 20 mg by mouth daily.     . Fluticasone-Salmeterol (ADVAIR) 250-50 MCG/DOSE AEPB Inhale into the lungs 2 (two) times daily.    Marland Kitchen guaiFENesin (MUCINEX) 600 MG 12 hr tablet Take 600 mg by mouth as needed.   Marland Kitchen ibuprofen (ADVIL,MOTRIN) 200 MG tablet Take 400 mg by mouth 2 (two) times daily as needed for mild pain or moderate pain.   Marland Kitchen ipratropium-albuterol (DUONEB) 0.5-2.5 (3) MG/3ML SOLN Take 3 mLs by nebulization 3 (three) times daily. 06/27/2016: Not using it 3 times daily  . montelukast (SINGULAIR) 10 MG tablet Take 1 tablet (10 mg total) by mouth at bedtime.   . Multiple Vitamins-Minerals (MULTIVITAMINS THER. W/MINERALS) TABS Take 1 tablet by mouth daily.     . ondansetron (ZOFRAN) 4 MG tablet Take 1 tablet (4 mg total) every 8 (eight) hours as needed by  mouth for nausea or vomiting.   Marland Kitchen oxybutynin (DITROPAN) 5 MG tablet Take 5 mg by mouth daily.  09/08/2013: Received from: External Pharmacy  . predniSONE (DELTASONE) 5 MG tablet Take 5 mg by mouth daily. As of November 06 2016 started on prednisone 8 mg po , Tapering prednisone by 1 mg every 3-4  Weeks, Presently taking 5 mg tab and 3 - 1 mg tabs   . roflumilast (DALIRESP) 500 MCG TABS tablet Take 500 mcg by mouth daily.     . roflumilast (DALIRESP) 500 MCG TABS tablet Take 500 mcg daily by mouth.   . SPIRIVA RESPIMAT 2.5 MCG/ACT AERS Inhale 1 puff into the lungs daily.  11/14/2014: Received from: External Pharmacy  . traMADol (ULTRAM) 50 MG tablet Take 50  mg by mouth every 6 (six) hours as needed (for back pain).   . triamcinolone (NASACORT ALLERGY 24HR) 55 MCG/ACT AERO nasal inhaler Place 2 sprays into the nose daily as needed (FOR ALLERGIES). 06/19/2016: prn   No facility-administered encounter medications on file as of 04/16/2017.     Functional Status:   In your present state of health, do you have any difficulty performing the following activities: 11/28/2016 09/17/2016  Hearing? N N  Vision? N N  Difficulty concentrating or making decisions? N N  Walking or climbing stairs? N N  Dressing or bathing? N N  Doing errands, shopping? N N  Preparing Food and eating ? N -  Using the Toilet? N -  In the past six months, have you accidently leaked urine? N -  Do you have problems with loss of bowel control? N -  Managing your Medications? N -  Managing your Finances? N -  Housekeeping or managing your Housekeeping? N -  Some recent data might be hidden    Fall/Depression Screening:    Fall Risk  11/28/2016 09/17/2016  Falls in the past year? No No  Risk for fall due to : Impaired balance/gait Impaired balance/gait   PHQ 2/9 Scores 11/28/2016 01/16/2012  PHQ - 2 Score 0 1    Assessment:   Met with Mrs Mezey at her home kitchen table with her dog, Ree Kida  Social Voiced interest in becoming more social She discussed her sister and  Her brother in Social worker "rook games" at [employer] center CM reviewed [facility] and [facility] activities but Mrs Teaney did not appear to be interested in [facility] services or attending the [facility] but is very aware of the community services  Tachycardia Heart rate up to 140 today 94%  Reports she saw her Cardiologist last week and he is aware of her increase heart rate and her medications were changed She is now taking 240 mg versus 180 mg to Cardizem   Pain in abdomen in bilateral groin areas   Medications Prednisone on 5 mg now And holding   See pcp next week for follow up   Respiratory No audible wheezing; VSS;  Oxygen intact  GI still noting blood in stool -1 1/2 weeks ago Mrs Knable reports frank gelatin consistency bleeding (she has taken a picture of this on her mobile phone-shown to CM) in clots which was worst first in am at intervals but gets better throughout the day -She reports this concern is worst when she is in pain - Noted more and frequent stool Last hgb in EPIC was 12.7 hct was 39.5 (may 2018) She has a colonoscopy scheduled on 05/16/17 at the hospital with Dr Karilyn Cota  Reviewed preventative care recommendations  to find out she has not had a GYN exam nor mammogram recently her insurance coverage checked to find a local OB GYN office at [facility] at [address] [place]  [place], [place] [address]   [phone] Discussed mammograms  Mrs Newnum BP was 134/78 when CM initially took it. She felt this was "a little high for me" CM re took her BP near the end of the home visit and it was then 120/72   Plan:  Evaluate Mrs Dewhirst for case closure and to follow up with Mrs Plaisted related to this CM again during this home visit discussed that she has met her goals for [facility] and is now maintaining her major medical conditions and preventive care goals.  Follow up in 2-4 weeks  Called Dr Emelda Fear to make and [facility] GYN appointment for 05/08/17, wed at 1330 with Cyril Mourning   Sent note to Dr Karilyn Cota related to GI concerns Discussed picture of Mrs [place] stool in description with notes from today's home visit Via EPIC in basket    Route note to care team members  Satartia L. Noelle Penner, RN, BSN, CCM [facility] Care Management [phone]

## 2017-04-17 ENCOUNTER — Ambulatory Visit: Payer: Medicare Other | Admitting: *Deleted

## 2017-04-21 ENCOUNTER — Other Ambulatory Visit: Payer: Self-pay | Admitting: Cardiology

## 2017-04-21 ENCOUNTER — Other Ambulatory Visit (INDEPENDENT_AMBULATORY_CARE_PROVIDER_SITE_OTHER): Payer: Self-pay | Admitting: *Deleted

## 2017-04-21 DIAGNOSIS — D508 Other iron deficiency anemias: Secondary | ICD-10-CM

## 2017-04-21 DIAGNOSIS — K5792 Diverticulitis of intestine, part unspecified, without perforation or abscess without bleeding: Secondary | ICD-10-CM

## 2017-04-21 MED ORDER — DILTIAZEM HCL ER COATED BEADS 180 MG PO CP24: 180.0000 mg | ORAL_CAPSULE | Freq: Every day | ORAL | 3 refills | Status: DC

## 2017-04-21 NOTE — Telephone Encounter (Signed)
°*  STAT* If patient is at the pharmacy, call can be transferred to refill team.   1. Which medications need to be refilled? (please list name of each medication and dose if known)  diltiazem (CARDIZEM CD) 180 MG 24 hr capsule [[id number]]   2. Which pharmacy/location (including street and city if local pharmacy) is medication to be sent to? [facility]   3. Do they need a 30 day or 90 day supply?  90 day

## 2017-04-21 NOTE — Progress Notes (Signed)
[facility]o 

## 2017-04-21 NOTE — Telephone Encounter (Signed)
cardizem refilled per request

## 2017-04-22 ENCOUNTER — Other Ambulatory Visit: Payer: Self-pay | Admitting: Cardiology

## 2017-04-22 MED ORDER — DILTIAZEM HCL ER COATED BEADS 240 MG PO CP24: 240.0000 mg | ORAL_CAPSULE | Freq: Every day | ORAL | 3 refills | Status: DC

## 2017-04-22 NOTE — Telephone Encounter (Signed)
Patient states that she needed new dosage for Diltiazem called in but old dosage was sent in. Please verify. /t g

## 2017-04-22 NOTE — Telephone Encounter (Signed)
Cardizem increased to 240 mg from 180 mg, refilled to [place] [facility]

## 2017-04-24 DIAGNOSIS — F419 Anxiety disorder, unspecified: Secondary | ICD-10-CM | POA: Diagnosis not present

## 2017-04-24 DIAGNOSIS — D508 Other iron deficiency anemias: Secondary | ICD-10-CM | POA: Diagnosis not present

## 2017-04-24 DIAGNOSIS — K5792 Diverticulitis of intestine, part unspecified, without perforation or abscess without bleeding: Secondary | ICD-10-CM | POA: Diagnosis not present

## 2017-04-24 DIAGNOSIS — R000 Tachycardia, unspecified: Secondary | ICD-10-CM | POA: Diagnosis not present

## 2017-04-24 DIAGNOSIS — J9611 Chronic respiratory failure with hypoxia: Secondary | ICD-10-CM | POA: Diagnosis not present

## 2017-04-24 DIAGNOSIS — J449 Chronic obstructive pulmonary disease, unspecified: Secondary | ICD-10-CM | POA: Diagnosis not present

## 2017-04-24 LAB — HEMOGLOBIN AND HEMATOCRIT, BLOOD
HCT: 36.3 % (ref 35.0–45.0)
HEMOGLOBIN: 12.1 g/dL (ref 11.7–15.5)

## 2017-05-07 ENCOUNTER — Encounter: Payer: Self-pay | Admitting: Adult Health

## 2017-05-07 NOTE — Patient Instructions (Signed)
[name]  05/07/2017     @PREFPERIOPPHARMACY @   Your procedure is scheduled on  05/16/2017  Report to [facility] at  900   A.M.  Call this number if you have problems the morning of surgery:  [phone]   Remember:  Do not eat food or drink liquids after midnight.  Take these medicines the morning of surgery with A SIP OF WATER zyrtec, klonopin, cardiazem, lexapro, singulair, zofran, ditropan, daliresp, ultram. Use your inhalers and your Duoneb before you come.   Do not wear jewelry, make-up or nail polish.  Do not wear lotions, powders, or perfumes, or deodorant.  Do not shave 48 hours prior to surgery.  Men may shave face and neck.  Do not bring valuables to the hospital.  [facility] is not responsible for any belongings or valuables.  Contacts, dentures or bridgework may not be worn into surgery.  Leave your suitcase in the car.  After surgery it may be brought to your room.  For patients admitted to the hospital, discharge time will be determined by your treatment team.  Patients discharged the day of surgery will not be allowed to drive home.   Name and phone number of your driver:   family Special instructions:  Follow the diet and prep instructions given to you by Dr Olevia Perches office.  Please read over the following fact sheets that you were given. Anesthesia Post-op Instructions and Care and Recovery After Surgery       Colonoscopy, Adult A colonoscopy is an exam to look at the large intestine. It is done to check for problems, such as:  Lumps (tumors).  Growths (polyps).  Swelling (inflammation).  Bleeding.  What happens before the procedure? Eating and drinking Follow instructions from your doctor about eating and drinking. These instructions may include:  A few days before the procedure - follow a low-fiber diet. ? Avoid nuts. ? Avoid seeds. ? Avoid dried fruit. ? Avoid raw fruits. ? Avoid vegetables.  1-3 days before the  procedure - follow a clear liquid diet. Avoid liquids that have red or purple dye. Drink only clear liquids, such as: ? Clear broth or bouillon. ? Black coffee or tea. ? Clear juice. ? Clear soft drinks or sports drinks. ? Gelatin dessert. ? Popsicles.  On the day of the procedure - do not eat or drink anything during the 2 hours before the procedure.  Bowel prep If you were prescribed an oral bowel prep:  Take it as told by your doctor. Starting the day before your procedure, you will need to drink a lot of liquid. The liquid will cause you to poop (have bowel movements) until your poop is almost clear or light green.  If your skin or butt gets irritated from diarrhea, you may: ? Wipe the area with wipes that have medicine in them, such as adult wet wipes with aloe and vitamin E. ? Put something on your skin that soothes the area, such as petroleum jelly.  If you throw up (vomit) while drinking the bowel prep, take a break for up to 60 minutes. Then begin the bowel prep again. If you keep throwing up and you cannot take the bowel prep without throwing up, call your doctor.  General instructions  Ask your doctor about changing or stopping your normal medicines. This is important if you take diabetes medicines or blood thinners.  Plan to have someone take you home from  the hospital or clinic. What happens during the procedure?  An IV tube may be put into one of your veins.  You will be given medicine to help you relax (sedative).  To reduce your risk of infection: ? Your doctors will wash their hands. ? Your anal area will be washed with soap.  You will be asked to lie on your side with your knees bent.  Your doctor will get a long, thin, flexible tube ready. The tube will have a camera and a light on the end.  The tube will be put into your anus.  The tube will be gently put into your large intestine.  Air will be delivered into your large intestine to keep it open. You  may feel some pressure or cramping.  The camera will be used to take photos.  A small tissue sample may be removed from your body to be looked at under a microscope (biopsy). If any possible problems are found, the tissue will be sent to a lab for testing.  If small growths are found, your doctor may remove them and have them checked for cancer.  The tube that was put into your anus will be slowly removed. The procedure may vary among doctors and hospitals. What happens after the procedure?  Your doctor will check on you often until the medicines you were given have worn off.  Do not drive for 24 hours after the procedure.  You may have a small amount of blood in your poop.  You may pass gas.  You may have mild cramps or bloating in your belly (abdomen).  It is up to you to get the results of your procedure. Ask your doctor, or the department performing the procedure, when your results will be ready. This information is not intended to replace advice given to you by your health care provider. Make sure you discuss any questions you have with your health care provider. Document Released: 05/25/2010 Document Revised: 02/21/2016 Document Reviewed: 07/04/2015 Elsevier Interactive Patient Education  2017 Elsevier Inc.  Colonoscopy, Adult, Care After This sheet gives you information about how to care for yourself after your procedure. Your health care provider may also give you more specific instructions. If you have problems or questions, contact your health care provider. What can I expect after the procedure? After the procedure, it is common to have:  A small amount of blood in your stool for 24 hours after the procedure.  Some gas.  Mild abdominal cramping or bloating.  Follow these instructions at home: General instructions   For the first 24 hours after the procedure: ? Do not drive or use machinery. ? Do not sign important documents. ? Do not drink alcohol. ? Do your  regular daily activities at a slower pace than normal. ? Eat soft, easy-to-digest foods. ? Rest often.  Take over-the-counter or prescription medicines only as told by your health care provider.  It is up to you to get the results of your procedure. Ask your health care provider, or the department performing the procedure, when your results will be ready. Relieving cramping and bloating  Try walking around when you have cramps or feel bloated.  Apply heat to your abdomen as told by your health care provider. Use a heat source that your health care provider recommends, such as a moist heat pack or a heating pad. ? Place a towel between your skin and the heat source. ? Leave the heat on for 20-30 minutes. ? Remove  the heat if your skin turns bright red. This is especially important if you are unable to feel pain, heat, or cold. You may have a greater risk of getting burned. Eating and drinking  Drink enough fluid to keep your urine clear or pale yellow.  Resume your normal diet as instructed by your health care provider. Avoid heavy or fried foods that are hard to digest.  Avoid drinking alcohol for as long as instructed by your health care provider. Contact a health care provider if:  You have blood in your stool 2-3 days after the procedure. Get help right away if:  You have more than a small spotting of blood in your stool.  You pass large blood clots in your stool.  Your abdomen is swollen.  You have nausea or vomiting.  You have a fever.  You have increasing abdominal pain that is not relieved with medicine. This information is not intended to replace advice given to you by your health care provider. Make sure you discuss any questions you have with your health care provider. Document Released: 12/05/2003 Document Revised: 01/15/2016 Document Reviewed: 07/04/2015 Elsevier Interactive Patient Education  2018 [place] Anesthesia is a term  that refers to techniques, procedures, and medicines that help a person stay safe and comfortable during a medical procedure. Monitored anesthesia care, or sedation, is one type of anesthesia. Your anesthesia specialist may recommend sedation if you will be having a procedure that does not require you to be unconscious, such as:  Cataract surgery.  A dental procedure.  A biopsy.  A colonoscopy.  During the procedure, you may receive a medicine to help you relax (sedative). There are three levels of sedation:  Mild sedation. At this level, you may feel awake and relaxed. You will be able to follow directions.  Moderate sedation. At this level, you will be sleepy. You may not remember the procedure.  Deep sedation. At this level, you will be asleep. You will not remember the procedure.  The more medicine you are given, the deeper your level of sedation will be. Depending on how you respond to the procedure, the anesthesia specialist may change your level of sedation or the type of anesthesia to fit your needs. An anesthesia specialist will monitor you closely during the procedure. Let your health care provider know about:  Any allergies you have.  All medicines you are taking, including vitamins, herbs, eye drops, creams, and over-the-counter medicines.  Any use of steroids (by mouth or as a cream).  Any problems you or family members have had with sedatives and anesthetic medicines.  Any blood disorders you have.  Any surgeries you have had.  Any medical conditions you have, such as sleep apnea.  Whether you are pregnant or may be pregnant.  Any use of cigarettes, alcohol, or street drugs. What are the risks? Generally, this is a safe procedure. However, problems may occur, including:  Getting too much medicine (oversedation).  Nausea.  Allergic reaction to medicines.  Trouble breathing. If this happens, a breathing tube may be used to help with breathing. It will be  removed when you are awake and breathing on your own.  Heart trouble.  Lung trouble.  Before the procedure Staying hydrated Follow instructions from your health care provider about hydration, which may include:  Up to 2 hours before the procedure - you may continue to drink clear liquids, such as water, clear fruit juice, black coffee, and plain tea.  Eating  and drinking restrictions Follow instructions from your health care provider about eating and drinking, which may include:  8 hours before the procedure - stop eating heavy meals or foods such as meat, fried foods, or fatty foods.  6 hours before the procedure - stop eating light meals or foods, such as toast or cereal.  6 hours before the procedure - stop drinking milk or drinks that contain milk.  2 hours before the procedure - stop drinking clear liquids.  Medicines Ask your health care provider about:  Changing or stopping your regular medicines. This is especially important if you are taking diabetes medicines or blood thinners.  Taking medicines such as aspirin and ibuprofen. These medicines can thin your blood. Do not take these medicines before your procedure if your health care provider instructs you not to.  Tests and exams  You will have a physical exam.  You may have blood tests done to show: ? How well your kidneys and liver are working. ? How well your blood can clot.  General instructions  Plan to have someone take you home from the hospital or clinic.  If you will be going home right after the procedure, plan to have someone with you for 24 hours.  What happens during the procedure?  Your blood pressure, heart rate, breathing, level of pain and overall condition will be monitored.  An IV tube will be inserted into one of your veins.  Your anesthesia specialist will give you medicines as needed to keep you comfortable during the procedure. This may mean changing the level of sedation.  The  procedure will be performed. After the procedure  Your blood pressure, heart rate, breathing rate, and blood oxygen level will be monitored until the medicines you were given have worn off.  Do not drive for 24 hours if you received a sedative.  You may: ? Feel sleepy, clumsy, or nauseous. ? Feel forgetful about what happened after the procedure. ? Have a sore throat if you had a breathing tube during the procedure. ? Vomit. This information is not intended to replace advice given to you by your health care provider. Make sure you discuss any questions you have with your health care provider. Document Released: 01/16/2005 Document Revised: 09/29/2015 Document Reviewed: 08/13/2015 Elsevier Interactive Patient Education  2018 [place], Care After These instructions provide you with information about caring for yourself after your procedure. Your health care provider may also give you more specific instructions. Your treatment has been planned according to current medical practices, but problems sometimes occur. Call your health care provider if you have any problems or questions after your procedure. What can I expect after the procedure? After your procedure, it is common to:  Feel sleepy for several hours.  Feel clumsy and have poor balance for several hours.  Feel forgetful about what happened after the procedure.  Have poor judgment for several hours.  Feel nauseous or vomit.  Have a sore throat if you had a breathing tube during the procedure.  Follow these instructions at home: For at least 24 hours after the procedure:   Do not: ? Participate in activities in which you could fall or become injured. ? Drive. ? Use heavy machinery. ? Drink alcohol. ? Take sleeping pills or medicines that cause drowsiness. ? Make important decisions or sign legal documents. ? Take care of children on your own.  Rest. Eating and drinking  Follow the  diet that is recommended by your  health care provider.  If you vomit, drink water, juice, or soup when you can drink without vomiting.  Make sure you have little or no nausea before eating solid foods. General instructions  Have a responsible adult stay with you until you are awake and alert.  Take over-the-counter and prescription medicines only as told by your health care provider.  If you smoke, do not smoke without supervision.  Keep all follow-up visits as told by your health care provider. This is important. Contact a health care provider if:  You keep feeling nauseous or you keep vomiting.  You feel light-headed.  You develop a rash.  You have a fever. Get help right away if:  You have trouble breathing. This information is not intended to replace advice given to you by your health care provider. Make sure you discuss any questions you have with your health care provider. Document Released: 08/13/2015 Document Revised: 12/13/2015 Document Reviewed: 08/13/2015 Elsevier Interactive Patient Education  [employer].

## 2017-05-08 ENCOUNTER — Encounter: Payer: Self-pay | Admitting: Adult Health

## 2017-05-08 ENCOUNTER — Other Ambulatory Visit (HOSPITAL_COMMUNITY)
Admission: RE | Admit: 2017-05-08 | Discharge: 2017-05-08 | Disposition: A | Discharge status: Home / Self Care | Payer: Medicare Other | Source: Ambulatory Visit | Attending: Adult Health | Admitting: Adult Health

## 2017-05-08 ENCOUNTER — Ambulatory Visit (INDEPENDENT_AMBULATORY_CARE_PROVIDER_SITE_OTHER): Payer: Medicare Other | Admitting: Adult Health

## 2017-05-08 VITALS — BP 136/68 | HR 78 | Resp 20 | Ht 63.0 in | Wt 145.6 lb

## 2017-05-08 DIAGNOSIS — Z9981 Dependence on supplemental oxygen: Secondary | ICD-10-CM | POA: Diagnosis not present

## 2017-05-08 DIAGNOSIS — R102 Pelvic and perineal pain: Secondary | ICD-10-CM

## 2017-05-08 DIAGNOSIS — Z124 Encounter for screening for malignant neoplasm of cervix: Secondary | ICD-10-CM | POA: Diagnosis not present

## 2017-05-08 DIAGNOSIS — K625 Hemorrhage of anus and rectum: Secondary | ICD-10-CM | POA: Insufficient documentation

## 2017-05-08 DIAGNOSIS — N852 Hypertrophy of uterus: Secondary | ICD-10-CM | POA: Diagnosis not present

## 2017-05-08 NOTE — Progress Notes (Signed)
Subjective:     Patient ID: [name], female   DOB: April 01, 1956, 62 y.o.   MRN: [id number]  HPI [name] is a 62 year old white female, divorced in for ?prolpse has pelvic pressure.She is on O2 and her sister is with her.Her last pap was in 2013. She travels by Wheelchair, but can walk short distances.  PCP is Dr Luan Pulling.   Review of Systems +pelvic pressure, ?prolapse Has had rectal bleeding,gettting colonoscopy 05/16/17, by Dr Laural Golden +low grade temp for over a year, with negative W/U Reviewed past medical,surgical, social and family history. Reviewed medications and allergies.     Objective:   Physical Exam BP 136/68 (BP Location: Left Arm, Patient Position: Sitting, Cuff Size: Normal)   Pulse 78   Resp 20   Ht 5\' 3"  (1.6 m)   Wt 145 lb 9.6 oz (66 kg)   BMI 25.79 kg/m    Skin warm and dry.Pelvic: external genitalia is normal in appearance no lesions, vagina: pale with loss of moisture and rugae,urethra has no lesions or masses noted, cervix:smooth and bulbous,pap with HPV performed, uterus:felt to be firm and slightly enlarged, adnexa: no masses or tenderness noted. Bladder is non tender and no masses felt.Rectal exam: good tone, no masses felt. PHQ 2 score 0.  Assessment:     1. Pelvic pressure in female   2. Uterine enlargement   3. Papanicolaou smear for cervical cancer screening       Plan:     Pap with HPV sent Return 05/14/17 at 11:30 am for GYN [place]  Physical in 1 year,pap in 3 if normal Get mammogram Has colonoscopy scheduled 05/16/17 to assess rectal bleeding

## 2017-05-08 NOTE — Progress Notes (Signed)
lab4  

## 2017-05-09 ENCOUNTER — Other Ambulatory Visit: Payer: Self-pay | Admitting: *Deleted

## 2017-05-09 NOTE — Patient Outreach (Signed)
[facility]) Care Management  05/09/2017  [name] November 24, 1955 [id number]   Care coordination (cancel home visit appointment)/case closure    [name] called CM to cancel her 05/14/17 CM home visit related to a schedule conflict for an upcoming OB GYN visit on 05/14/17  [name] inquired about further home visits and CM discussed review of her [facility] community goals- indicating she has met them.    [name] after discussion with CM agreed to discontinue CM home visits and to contact CM for any further concerns. "That's it then" [name] voiced appreciation of CM services and home visits. [name] discussed appreciation of having the availability to contact CM and 24 hour nurse line as needed for future questions  Plans case closure for Consumer successfully achieved goal   Update [facility] CMA Letters to be sent to [name] and Dr Luan Pulling for closure Route note to care team members   [facility] CM Care Plan Problem One     Most Recent Value  Care Plan Problem One  Knowledge Deficits related to new plan of care for long term self health management of COPD, abdominal pain and tachycardia  Role Documenting the Problem One  Care Management [place] for Problem One  Active  [facility] Long Term Goal   Over the next 31 days, patient will verbalize understanding of plan of care for long term self health mangaement of COPD, tachycardia as established by pulmonologist/PCP  [facility] Long Term Goal Start Date  08/13/16  [facility] Long Term Goal Met Date  09/17/16  Interventions for Problem One Long Term Goal  reviewed COPD zone symptoms to report to pcp/pulmonologist, provded [facility] Calendar including list of zone symptoms, consult with pulmonologist/pcp discussed tachycardia  [facility] CM Short Term Goal #1   Over the next 30 days, patient will verbalize understanding of when to call provider for worsening symptoms  [facility] CM Short Term Goal #1 Start Date  08/13/16  [facility] CM Short Term Goal #1 Met Date   09/17/16  Interventions for Short Term Goal #1  review of yellow and red zone symptoms, provided list of symptoms  [facility] CM Short Term Goal #2   Over the next 30 days, patient will verablize understanding of plan of care for management of abdominal pain  [facility] CM Short Term Goal #2 Start Date  08/13/16  [facility] CM Short Term Goal #2 Met Date  09/17/16  Interventions for Short Term Goal #2  assess symptoms, review  treatment plan options, re evaluate for improve,ments  [facility] CM Short Term Goal #3  over the next 30 days patient will be able to get answers to questions about completed test results from pcp and specialists  [facility] CM Short Term Goal #3 Start Date  09/17/16  [facility] CM Short Term Goal #3 Met Date  10/24/16  Interventions for Short Tern Goal #3  review test results, answer questions, educate and provide resources as needed       [place] L. Lavina Hamman, RN, BSN, [place] Care Management [phone]

## 2017-05-13 ENCOUNTER — Other Ambulatory Visit: Payer: Self-pay

## 2017-05-13 ENCOUNTER — Encounter (HOSPITAL_COMMUNITY): Payer: Self-pay

## 2017-05-13 ENCOUNTER — Encounter (HOSPITAL_COMMUNITY)
Admission: RE | Admit: 2017-05-13 | Discharge: 2017-05-13 | Disposition: A | Discharge status: Home / Self Care | Payer: Medicare Other | Source: Ambulatory Visit | Attending: Internal Medicine | Admitting: Internal Medicine

## 2017-05-13 DIAGNOSIS — Z9989 Dependence on other enabling machines and devices: Secondary | ICD-10-CM | POA: Diagnosis not present

## 2017-05-13 DIAGNOSIS — Z87891 Personal history of nicotine dependence: Secondary | ICD-10-CM | POA: Diagnosis not present

## 2017-05-13 DIAGNOSIS — K644 Residual hemorrhoidal skin tags: Secondary | ICD-10-CM | POA: Diagnosis not present

## 2017-05-13 DIAGNOSIS — K5732 Diverticulitis of large intestine without perforation or abscess without bleeding: Secondary | ICD-10-CM

## 2017-05-13 DIAGNOSIS — J449 Chronic obstructive pulmonary disease, unspecified: Secondary | ICD-10-CM | POA: Diagnosis not present

## 2017-05-13 DIAGNOSIS — F419 Anxiety disorder, unspecified: Secondary | ICD-10-CM | POA: Diagnosis not present

## 2017-05-13 DIAGNOSIS — D123 Benign neoplasm of transverse colon: Secondary | ICD-10-CM | POA: Diagnosis not present

## 2017-05-13 DIAGNOSIS — I471 Supraventricular tachycardia: Secondary | ICD-10-CM | POA: Diagnosis not present

## 2017-05-13 DIAGNOSIS — F329 Major depressive disorder, single episode, unspecified: Secondary | ICD-10-CM | POA: Diagnosis not present

## 2017-05-13 DIAGNOSIS — R1030 Lower abdominal pain, unspecified: Secondary | ICD-10-CM | POA: Diagnosis present

## 2017-05-13 DIAGNOSIS — G4730 Sleep apnea, unspecified: Secondary | ICD-10-CM | POA: Diagnosis not present

## 2017-05-13 DIAGNOSIS — R7303 Prediabetes: Secondary | ICD-10-CM | POA: Diagnosis not present

## 2017-05-13 HISTORY — DX: Major depressive disorder, single episode, unspecified: F32.9

## 2017-05-13 HISTORY — DX: Dyspnea, unspecified: R06.00

## 2017-05-13 HISTORY — DX: Cardiac arrhythmia, unspecified: I49.9

## 2017-05-13 HISTORY — DX: Sleep apnea, unspecified: G47.30

## 2017-05-13 HISTORY — DX: Zoster without complications: B02.9

## 2017-05-13 HISTORY — DX: Depression, unspecified: F32.A

## 2017-05-13 HISTORY — DX: Anxiety disorder, unspecified: F41.9

## 2017-05-13 HISTORY — DX: Pneumonia, unspecified organism: J18.9

## 2017-05-13 LAB — CBC WITH DIFFERENTIAL/PLATELET
BASOS ABS: 0 10*3/uL (ref 0.0–0.1)
Basophils Relative: 0 %
EOS ABS: 0.1 10*3/uL (ref 0.0–0.7)
Eosinophils Relative: 1 %
HEMATOCRIT: 37.4 % (ref 36.0–46.0)
HEMOGLOBIN: 11.9 g/dL — AB (ref 12.0–15.0)
Lymphocytes Relative: 10 %
Lymphs Abs: 1.3 10*3/uL (ref 0.7–4.0)
MCH: 31.5 pg (ref 26.0–34.0)
MCHC: 31.8 g/dL (ref 30.0–36.0)
MCV: 98.9 fL (ref 78.0–100.0)
Monocytes Absolute: 0.6 10*3/uL (ref 0.1–1.0)
Monocytes Relative: 5 %
NEUTROS ABS: 11.2 10*3/uL — AB (ref 1.7–7.7)
NEUTROS PCT: 84 %
Platelets: 349 10*3/uL (ref 150–400)
RBC: 3.78 MIL/uL — ABNORMAL LOW (ref 3.87–5.11)
RDW: 12.8 % (ref 11.5–15.5)
WBC: 13.2 10*3/uL — AB (ref 4.0–10.5)

## 2017-05-13 LAB — BASIC METABOLIC PANEL
ANION GAP: 13 (ref 5–15)
BUN: 14 mg/dL (ref 6–20)
CALCIUM: 9 mg/dL (ref 8.9–10.3)
CHLORIDE: 97 mmol/L — AB (ref 101–111)
CO2: 29 mmol/L (ref 22–32)
Creatinine, Ser: 0.83 mg/dL (ref 0.44–1.00)
GFR calc non Af Amer: >60 mL/min (ref >60)
Glucose, Bld: 162 mg/dL — ABNORMAL HIGH (ref 65–99)
Potassium: 4.1 mmol/L (ref 3.5–5.1)
SODIUM: 139 mmol/L (ref 135–145)

## 2017-05-13 LAB — CYTOLOGY - PAP
Diagnosis: NEGATIVE
HPV: NOT DETECTED

## 2017-05-14 ENCOUNTER — Ambulatory Visit: Payer: Self-pay | Admitting: *Deleted

## 2017-05-14 ENCOUNTER — Ambulatory Visit (INDEPENDENT_AMBULATORY_CARE_PROVIDER_SITE_OTHER): Payer: Medicare Other

## 2017-05-14 DIAGNOSIS — R102 Pelvic and perineal pain: Secondary | ICD-10-CM

## 2017-05-14 DIAGNOSIS — N852 Hypertrophy of uterus: Secondary | ICD-10-CM | POA: Diagnosis not present

## 2017-05-14 NOTE — Progress Notes (Signed)
PELVIC US TA/TV: homogeneous anteverted uterus,wnl,EEC 1.9 mm,normal ovaries bilat,ovaries appear mobile,no pain during ultrasound

## 2017-05-15 ENCOUNTER — Telehealth: Payer: Self-pay | Admitting: Adult Health

## 2017-05-15 NOTE — Telephone Encounter (Signed)
Pt aware US normal  

## 2017-05-16 ENCOUNTER — Encounter (HOSPITAL_COMMUNITY)
Admission: RE | Disposition: A | Discharge status: Home / Self Care | Payer: Self-pay | Source: Ambulatory Visit | Attending: Internal Medicine

## 2017-05-16 ENCOUNTER — Ambulatory Visit (HOSPITAL_COMMUNITY): Payer: Medicare Other | Admitting: Anesthesiology

## 2017-05-16 ENCOUNTER — Encounter (HOSPITAL_COMMUNITY): Payer: Self-pay | Admitting: *Deleted

## 2017-05-16 ENCOUNTER — Ambulatory Visit (HOSPITAL_COMMUNITY)
Admission: RE | Admit: 2017-05-16 | Discharge: 2017-05-16 | Disposition: A | Discharge status: Home / Self Care | Payer: Medicare Other | Source: Ambulatory Visit | Attending: Internal Medicine | Admitting: Internal Medicine

## 2017-05-16 DIAGNOSIS — K5732 Diverticulitis of large intestine without perforation or abscess without bleeding: Secondary | ICD-10-CM | POA: Insufficient documentation

## 2017-05-16 DIAGNOSIS — I471 Supraventricular tachycardia: Secondary | ICD-10-CM | POA: Insufficient documentation

## 2017-05-16 DIAGNOSIS — K644 Residual hemorrhoidal skin tags: Secondary | ICD-10-CM | POA: Insufficient documentation

## 2017-05-16 DIAGNOSIS — D123 Benign neoplasm of transverse colon: Secondary | ICD-10-CM | POA: Insufficient documentation

## 2017-05-16 DIAGNOSIS — J449 Chronic obstructive pulmonary disease, unspecified: Secondary | ICD-10-CM | POA: Diagnosis not present

## 2017-05-16 DIAGNOSIS — G4730 Sleep apnea, unspecified: Secondary | ICD-10-CM | POA: Insufficient documentation

## 2017-05-16 DIAGNOSIS — Z9989 Dependence on other enabling machines and devices: Secondary | ICD-10-CM | POA: Diagnosis not present

## 2017-05-16 DIAGNOSIS — Z87891 Personal history of nicotine dependence: Secondary | ICD-10-CM | POA: Insufficient documentation

## 2017-05-16 DIAGNOSIS — R7303 Prediabetes: Secondary | ICD-10-CM | POA: Insufficient documentation

## 2017-05-16 DIAGNOSIS — R1030 Lower abdominal pain, unspecified: Secondary | ICD-10-CM | POA: Diagnosis not present

## 2017-05-16 DIAGNOSIS — F419 Anxiety disorder, unspecified: Secondary | ICD-10-CM | POA: Insufficient documentation

## 2017-05-16 DIAGNOSIS — F329 Major depressive disorder, single episode, unspecified: Secondary | ICD-10-CM | POA: Diagnosis not present

## 2017-05-16 HISTORY — PX: COLONOSCOPY WITH PROPOFOL: SHX5780

## 2017-05-16 HISTORY — PX: POLYPECTOMY: SHX5525

## 2017-05-16 LAB — GLUCOSE, CAPILLARY: Glucose-Capillary: 113 mg/dL — ABNORMAL HIGH (ref 65–99)

## 2017-05-16 SURGERY — COLONOSCOPY WITH PROPOFOL
Anesthesia: Monitor Anesthesia Care

## 2017-05-16 MED ORDER — PROPOFOL 10 MG/ML IV BOLUS
INTRAVENOUS | Status: AC
  Filled 2017-05-16: qty 40

## 2017-05-16 MED ORDER — CIPROFLOXACIN HCL 500 MG PO TABS: 500.0000 mg | ORAL_TABLET | Freq: Two times a day (BID) | ORAL | 0 refills | Status: DC

## 2017-05-16 MED ORDER — LACTATED RINGERS IV SOLN
INTRAVENOUS | Status: DC
  Administered 2017-05-16: 10:00:00 via INTRAVENOUS

## 2017-05-16 MED ORDER — CHLORHEXIDINE GLUCONATE CLOTH 2 % EX PADS: 6.0000 | MEDICATED_PAD | Freq: Once | CUTANEOUS | Status: DC

## 2017-05-16 MED ORDER — PROPOFOL 10 MG/ML IV BOLUS
INTRAVENOUS | Status: DC | PRN
  Administered 2017-05-16 (×2): 20 mg via INTRAVENOUS

## 2017-05-16 MED ORDER — MIDAZOLAM HCL 2 MG/2ML IJ SOLN
INTRAMUSCULAR | Status: AC
  Filled 2017-05-16: qty 2

## 2017-05-16 MED ORDER — PROPOFOL 500 MG/50ML IV EMUL
INTRAVENOUS | Status: DC | PRN
  Administered 2017-05-16: 150 ug/kg/min via INTRAVENOUS

## 2017-05-16 MED ORDER — MIDAZOLAM HCL 2 MG/2ML IJ SOLN
1.0000 mg | INTRAMUSCULAR | Status: AC
  Administered 2017-05-16: 2 mg via INTRAVENOUS

## 2017-05-16 MED ORDER — METRONIDAZOLE 500 MG PO TABS: 500.0000 mg | ORAL_TABLET | Freq: Two times a day (BID) | ORAL | 0 refills | Status: DC

## 2017-05-16 NOTE — Anesthesia Preprocedure Evaluation (Addendum)
Anesthesia Evaluation  Patient identified by MRN, date of birth, ID band Patient awake    Reviewed: Allergy & Precautions, NPO status , Patient's Chart, lab work & pertinent test results  Airway Mallampati: II  TM Distance: >3 FB Neck ROM: Full    Dental  (+) Teeth Intact   Pulmonary shortness of breath (Bipap at home) and Long-Term Oxygen Therapy, sleep apnea, Continuous Positive Airway Pressure Ventilation and Oxygen sleep apnea , COPD, former smoker,    breath sounds clear to auscultation       Cardiovascular + dysrhythmias Supra Ventricular Tachycardia  Rhythm:Regular Rate:Normal     Neuro/Psych PSYCHIATRIC DISORDERS Anxiety Depression  Neuromuscular disease    GI/Hepatic negative GI ROS, Neg liver ROS,   Endo/Other  diabetes (pre-DM)  Renal/GU negative Renal ROS     Musculoskeletal   Abdominal   Peds  Hematology  (+) anemia ,   Anesthesia Other Findings   Reproductive/Obstetrics                            Anesthesia Physical Anesthesia Plan  ASA: III  Anesthesia Plan: MAC   Post-op Pain Management:    Induction: Intravenous  PONV Risk Score and Plan:   Airway Management Planned: Simple Face Mask  Additional Equipment:   Intra-op Plan:   Post-operative Plan:   Informed Consent: I have reviewed the patients History and Physical, chart, labs and discussed the procedure including the risks, benefits and alternatives for the proposed anesthesia with the patient or authorized representative who has indicated his/her understanding and acceptance.     Plan Discussed with:   Anesthesia Plan Comments: (Possible mask with pressure support.)       Anesthesia Quick Evaluation

## 2017-05-16 NOTE — Op Note (Signed)
[facility] Patient Name: [name] Procedure Date: 05/16/2017 10:16 AM MRN: [id number] Date of Birth: 1955-06-29 Attending MD: Lionel December , MD CSN: [id number] Age: 62 Admit Type: Outpatient Procedure:                Colonoscopy Indications:              Lower abdominal pain, Follow-up of diverticulitis Providers:                Lionel December, MD, Judee Clara, RN, Buel Ream.                            Thomasena Edis RN, RN Referring MD:             Oneal Deputy. Juanetta Gosling, MD Medicines:                Propofol per Anesthesia Complications:            No immediate complications. Estimated Blood Loss:     Estimated blood loss: none. Procedure:                Pre-Anesthesia Assessment:                           - Prior to the procedure, a History and Physical                            was performed, and patient medications and                            allergies were reviewed. The patient's tolerance of                            previous anesthesia was also reviewed. The risks                            and benefits of the procedure and the sedation                            options and risks were discussed with the patient.                            All questions were answered, and informed consent                            was obtained. Prior Anticoagulants: The patient                            last took aspirin 2 days prior to the procedure.                            ASA Grade Assessment: III - A patient with severe                            systemic disease. After reviewing the risks and  benefits, the patient was deemed in satisfactory                            condition to undergo the procedure.                           After obtaining informed consent, the colonoscope                            was passed under direct vision. Throughout the                            procedure, the patient's blood pressure, pulse, and    oxygen saturations were monitored continuously. The                            EC-[id number] ([id number]) scope was introduced through                            the and advanced to the the cecum, identified by                            appendiceal orifice and ileocecal valve. The                            colonoscopy was performed without difficulty. The                            patient tolerated the procedure well. The quality                            of the bowel preparation was adequate to identify                            polyps. The ileocecal valve, appendiceal orifice,                            and rectum were photographed. Scope In: 10:55:40 AM Scope Out: 11:20:02 AM Total Procedure Duration: 0 hours 24 minutes 22 seconds  Findings:      The perianal exam findings include a skin tag.      The digital rectal exam was normal.      A 6 mm polyp was found in the hepatic flexure. The polyp was       semi-pedunculated. The polyp was removed with a hot snare. Resection and       retrieval were complete. The pathology specimen was placed into Bottle       Number 1.      A 7 mm polyp was found in the hepatic flexure. The polyp was       semi-pedunculated. The polyp was removed with a hot snare. Resection and       retrieval were complete. The pathology specimen was placed into Bottle       Number 1.      Scattered medium-mouthed diverticula were found in the sigmoid colon.      Diverticula were found in  the distal sigmoid colon. Purulent discharge       was seen in association with the diverticular opening, consistent with       diverticulitis.      External hemorrhoids were found during retroflexion. The hemorrhoids       were small. Impression:               - Perianal skin tag found on perianal exam.                           - One 6 mm polyp at the hepatic flexure, removed                            with a hot snare. Resected and retrieved.                           - One 7 mm  polyp at the hepatic flexure, removed                            with a hot snare. Resected and retrieved.                           - Diverticulosis in the sigmoid colon.                           - Purulent discharge and blood was seen in                            association with the diverticular opening,                            indicative of diverticulitis.                           - External hemorrhoids. Moderate Sedation:      Per Anesthesia Care Recommendation:           - Resume previous diet today.                           - Continue present medications.                           - No aspirin, ibuprofen, naproxen, or other                            non-steroidal anti-inflammatory drugs for 7 days                            after polyp removal.                           - Await pathology results.                           - Cipro 500 mg p.o. twice daily for 2 weeks                           -  Metronidazole 500 mg p.o. twice daily for 2 weeks                           - OV in 2 weeks.                           - Repeat colonoscopy in 5 years for surveillance.                           - Patient has a contact number available for                            emergencies. The signs and symptoms of potential                            delayed complications were discussed with the                            patient. Return to normal activities tomorrow.                            Written discharge instructions were provided to the                            patient. Procedure Code(s):        --- Professional ---                           [phone], Colonoscopy, flexible; with removal of                            tumor(s), polyp(s), or other lesion(s) by snare                            technique Diagnosis Code(s):        --- Professional ---                           D12.3, Benign neoplasm of transverse colon (hepatic                            flexure or splenic flexure)                            K64.4, Residual hemorrhoidal skin tags                           K57.32, Diverticulitis of large intestine without                            perforation or abscess without bleeding                           R10.30, Lower abdominal pain, unspecified CPT copyright 2016 [employer]. All rights reserved. The codes documented in this report are preliminary and upon coder review  may  be revised to meet current compliance requirements. Lionel December, MD Lionel December, MD 05/16/2017 11:39:52 AM This report has been signed electronically. Number of Addenda: 0

## 2017-05-16 NOTE — Anesthesia Postprocedure Evaluation (Signed)
Anesthesia Post Note  Patient: [name]  Procedure(s) Performed: COLONOSCOPY WITH PROPOFOL (N/A ) POLYPECTOMY  Patient location during evaluation: PACU Anesthesia Type: MAC Level of consciousness: awake, awake and alert, oriented and patient cooperative Pain management: pain level controlled Vital Signs Assessment: post-procedure vital signs reviewed and stable Respiratory status: spontaneous breathing, nonlabored ventilation, respiratory function stable and patient connected to nasal cannula oxygen Cardiovascular status: stable Postop Assessment: no apparent nausea or vomiting Anesthetic complications: no     Last Vitals:  Vitals:   05/16/17 1010 05/16/17 1130  BP: 131/72 135/77  Pulse:  (!) 104  Resp: (!) 46 (!) 21  Temp:  37 C  SpO2: 98% 100%    Last Pain:  Vitals:   05/16/17 0946  TempSrc: Oral  PainSc: 2                  [name] L

## 2017-05-16 NOTE — Transfer of Care (Signed)
Immediate Anesthesia Transfer of Care Note  Patient: [name]  Procedure(s) Performed: COLONOSCOPY WITH PROPOFOL (N/A ) POLYPECTOMY  Patient Location: PACU  Anesthesia Type:MAC  Level of Consciousness: awake, alert , oriented and patient cooperative  Airway & Oxygen Therapy: Patient Spontanous Breathing and Patient connected to nasal cannula oxygen  Post-op Assessment: Report given to RN and Post -op Vital signs reviewed and stable  Post vital signs: Reviewed and stable  Last Vitals:  Vitals:   05/16/17 1005 05/16/17 1010  BP: 129/71 131/72  Pulse:    Resp: (!) 21 (!) 46  Temp:    SpO2: 95% 98%    Last Pain:  Vitals:   05/16/17 0946  TempSrc: Oral  PainSc: 2       Patients Stated Pain Goal: 4 (45/40/98 1191)  Complications: No apparent anesthesia complications

## 2017-05-16 NOTE — H&P (Signed)
[name] is an 62 y.o. female.   Chief Complaint: Patient is here for colonoscopy. HPI: Patient is 62 year old Caucasian female with multiple medical problems who presents with recurrent lower abdominal pain.  About 2 months ago she was treated for diverticulitis.  She remains with daily pain.  She also has explosive bowel movements.  But her stools are always formed.  She also showed me a picture showing blood in stool.  This occurred several weeks ago.  She says she has gained 50 pounds in the last 1 year.  Weight gain is felt to be due to prednisone and she is being tapered off.  She is now on 5 mg daily.  She had colonoscopy in November 2012 with removal of 2 small polyps and one was tubular adenoma. Family history is negative for CRC.  Past Medical History:  Diagnosis Date  . Anxiety   . Complication of anesthesia    hard to maintain oxygen levels after her lst surgery:   . COPD (chronic obstructive pulmonary disease) (HCC)    Home oxygen at [facility]  . Depression   . Diverticulosis   . Dyspnea   . Dysrhythmia   . History of cardiac catheterization    No significant CAD 2005  . Hyperlipidemia   . Hypoglycemia   . IBS (irritable bowel syndrome)   . Pneumonia 2015  . Shingles 2012  . Sleep apnea   . SVT (supraventricular tachycardia) (HCC)     Past Surgical History:  Procedure Laterality Date  . BACK SURGERY    . CARDIAC CATHETERIZATION    . COLONOSCOPY  03/22/2011   Procedure: COLONOSCOPY;  Surgeon: Rogene Houston, MD;  Location: [facility];  Service: Endoscopy;  Laterality: N/A;  . SPINAL FUSION      Family History  Problem Relation Age of Onset  . Asthma Mother   . Heart attack Mother   . Diabetes Mother   . Heart failure Mother   . Colon cancer Neg Hx    Social History:  reports that she quit smoking about 2 years ago. Her smoking use included cigarettes. She has a 40.00 pack-year smoking history. she has never used smokeless tobacco. She reports that she  drinks alcohol. She reports that she does not use drugs.  Allergies:  Allergies  Allergen Reactions  . Iohexol Anaphylaxis and Other (See Comments)     Desc: IV DYE  ANAPHYLATIC SHOCK X2 ONCE AFTER PREMEDS   . Neosporin [Neomycin-Bacitracin Zn-Polymyx] Itching  . Adhesive [Tape] Itching and Other (See Comments)    Break out  . Chantix [Varenicline] Other (See Comments)    PALPITATIONS  . Ketek [Telithromycin] Other (See Comments)    Loopy, eyes went different directions  . Penicillins Other (See Comments)    Unknown Has patient had a PCN reaction causing immediate rash, facial/tongue/throat swelling, SOB or lightheadedness with hypotension: Unknown Has patient had a PCN reaction causing severe rash involving mucus membranes or skin necrosis: Unknown Has patient had a PCN reaction that required hospitalization: Unknown Has patient had a PCN reaction occurring within the last 10 years: No If all of the above answers are "NO", then may proceed with Cephalosporin use.    Medications Prior to Admission  Medication Sig Dispense Refill  . acyclovir (ZOVIRAX) 800 MG tablet Take 800 mg by mouth 5 (five) times daily as needed (for break outs).     Marland Kitchen albuterol (PROAIR HFA) 108 (90 BASE) MCG/ACT inhaler Inhale 2 puffs into the lungs every  6 (six) hours as needed for wheezing or shortness of breath.     . ALPRAZolam (XANAX) 0.25 MG tablet Take 0.25 mg by mouth 2 (two) times daily as needed for anxiety.    Marland Kitchen aspirin 81 MG tablet Take 81 mg by mouth daily.    Marland Kitchen atorvastatin (LIPITOR) 40 MG tablet TAKE (1) TABLET BY MOUTH ONCE DAILY. (Patient taking differently: Take 40 mg by mouth at bedtime) 90 tablet 3  . azithromycin (ZITHROMAX) 250 MG tablet Take 250 mg by mouth every Monday, Wednesday, and Friday.     . carboxymethylcellulose (REFRESH) 1 % ophthalmic solution Place 2 drops into both eyes 3 (three) times daily as needed (for dry eyes).    . cetirizine (ZYRTEC) 10 MG tablet Take 10 mg by mouth  daily.      . clonazePAM (KLONOPIN) 0.5 MG tablet Take 1 tablet (0.5 mg total) by mouth at bedtime as needed for anxiety. 10 tablet 0  . diltiazem (CARDIZEM CD) 240 MG 24 hr capsule Take 1 capsule (240 mg total) by mouth daily. 90 capsule 3  . escitalopram (LEXAPRO) 20 MG tablet Take 20 mg by mouth daily.      . Fluticasone-Salmeterol (ADVAIR) 250-50 MCG/DOSE AEPB Inhale 1 puff into the lungs 2 (two) times daily.     Marland Kitchen guaiFENesin (MUCINEX) 600 MG 12 hr tablet Take 600 mg by mouth 2 (two) times daily as needed for cough or to loosen phlegm.     Marland Kitchen ibuprofen (ADVIL,MOTRIN) 200 MG tablet Take 400 mg by mouth every 6 (six) hours as needed for headache or moderate pain.     Marland Kitchen ipratropium-albuterol (DUONEB) 0.5-2.5 (3) MG/3ML SOLN Take 3 mLs by nebulization 3 (three) times daily. (Patient taking differently: Take 3 mLs by nebulization 3 (three) times daily as needed (for shortness of breath or wheezing). ) 360 mL 1  . montelukast (SINGULAIR) 10 MG tablet Take 1 tablet (10 mg total) by mouth at bedtime. 30 tablet 1  . Multiple Vitamins-Minerals (MULTIVITAMINS THER. W/MINERALS) TABS Take 1 tablet by mouth daily.      . ondansetron (ZOFRAN) 4 MG tablet Take 1 tablet (4 mg total) every 8 (eight) hours as needed by mouth for nausea or vomiting. 30 tablet 0  . oxybutynin (DITROPAN) 5 MG tablet Take 5 mg by mouth daily.     . OXYGEN Inhale 4 L into the lungs continuous.    . predniSONE (DELTASONE) 5 MG tablet Take 5 mg by mouth daily.     . roflumilast (DALIRESP) 500 MCG TABS tablet Take 500 mcg by mouth daily.      . sodium chloride (OCEAN) 0.65 % SOLN nasal spray Place 2-3 sprays into both nostrils as needed for congestion.    Marland Kitchen SPIRIVA RESPIMAT 2.5 MCG/ACT AERS Inhale 2 puffs into the lungs daily.     . traMADol (ULTRAM) 50 MG tablet Take 50 mg by mouth every 6 (six) hours as needed for moderate pain.     . traZODone (DESYREL) 50 MG tablet Take 50 mg by mouth at bedtime as needed for sleep.    Marland Kitchen  triamcinolone (NASACORT ALLERGY 24HR) 55 MCG/ACT AERO nasal inhaler Place 2 sprays into the nose 2 (two) times daily as needed (for allergies).       Results for orders placed or performed during the hospital encounter of 05/16/17 (from the past 48 hour(s))  Glucose, capillary     Status: Abnormal   Collection Time: 05/16/17  9:44 AM  Result Value Ref  Range   Glucose-Capillary 113 (H) 65 - 99 mg/dL   US Pelvis Transvanginal Non-ob (tv Only)  Result Date: 05/14/2017 GYNECOLOGIC SONOGRAM SHAVAUGHN SEIDL is a 62 y.o. G1P1002 postmenopausal,she is here for a pelvic sonogram for pelvic pressure. Uterus                      5.1 x 2.6 x 3.3 cm, vol 20 ml, homogeneous anteverted uterus,wnl Endometrium          1.9 mm, symmetrical, wnl Right ovary             2.3 x 1 x 1.5 cm, wnl Left ovary                1.9 x 1.4 x 1.8 cm, wnl No free fluid Technician Comments: PELVIC US TA/TV: homogeneous anteverted uterus,wnl,EEC 1.9 mm,normal ovaries bilat,ovaries appear mobile,no pain during ultrasound [employer] 05/14/2017 12:36 PM Clinical Impression and recommendations: I have reviewed the sonogram results above, combined with the patient's current clinical course, below are my impressions and any appropriate recommendations for management based on the sonographic findings. Uterus is normal small consistent with post menopausal state Endometrium is thin, again, consistent with post menopausal female Both ovaries are normal Florian Buff 05/14/2017 6:19 PM   US Pelvis (transabdominal Only)  Result Date: 05/14/2017 GYNECOLOGIC SONOGRAM JOELLA SAEFONG is a 62 y.o. G1P1002 postmenopausal,she is here for a pelvic sonogram for pelvic pressure. Uterus                      5.1 x 2.6 x 3.3 cm, vol 20 ml, homogeneous anteverted uterus,wnl Endometrium          1.9 mm, symmetrical, wnl Right ovary             2.3 x 1 x 1.5 cm, wnl Left ovary                1.9 x 1.4 x 1.8 cm, wnl No free fluid Technician Comments: PELVIC US TA/TV:  homogeneous anteverted uterus,wnl,EEC 1.9 mm,normal ovaries bilat,ovaries appear mobile,no pain during ultrasound [employer] 05/14/2017 12:36 PM Clinical Impression and recommendations: I have reviewed the sonogram results above, combined with the patient's current clinical course, below are my impressions and any appropriate recommendations for management based on the sonographic findings. Uterus is normal small consistent with post menopausal state Endometrium is thin, again, consistent with post menopausal female Both ovaries are normal Florian Buff 05/14/2017 6:19 PM    ROS  Blood pressure 131/72, pulse (!) 102, temperature 98.2 F (36.8 C), temperature source Oral, resp. rate (!) 46, height '5\' 3"'$  (1.6 m), weight 143 lb (64.9 kg), SpO2 98 %. Physical Exam  Constitutional: She appears well-developed and well-nourished.  HENT:  Mouth/Throat: Oropharynx is clear and moist.  Eyes: Conjunctivae are normal. No scleral icterus.  Neck: No thyromegaly present.  Cardiovascular: Normal rate, regular rhythm and normal heart sounds.  No murmur heard. Respiratory: Effort normal and breath sounds normal.  GI:  Abdomen is full.  Bowel sounds are normal.  She has small umbilical hernia.  It is reducible.  On palpation abdomen is soft.  She has mild tenderness across lower half of her abdomen.  No organomegaly or masses.  Musculoskeletal: She exhibits no edema.  Lymphadenopathy:    She has no cervical adenopathy.  Neurological: She is alert.  Skin: Skin is warm and dry.     Assessment/Plan History of diverticulitis.  Rectal bleeding.  History of colonic adenoma. Diagnostic colonoscopy  Hildred Laser, MD 05/16/2017, 10:35 AM

## 2017-05-16 NOTE — Discharge Instructions (Signed)
No aspirin or NSAIDs for 1 week. Resume other medications as before. Cipro 500 mg p.o. twice daily for 2 weeks. Metronidazole 500 mg p.o. twice daily for 2 weeks. Resume usual diet. No driving for 24 hours. Physician will call with biopsy results. Office visit in 2 weeks.  Office will call.        PATIENT INSTRUCTIONS POST-ANESTHESIA  IMMEDIATELY FOLLOWING SURGERY:  Do not drive or operate machinery for the first twenty four hours after surgery.  Do not make any important decisions for twenty four hours after surgery or while taking narcotic pain medications or sedatives.  If you develop intractable nausea and vomiting or a severe headache please notify your doctor immediately.  FOLLOW-UP:  Please make an appointment with your surgeon as instructed. You do not need to follow up with anesthesia unless specifically instructed to do so.  WOUND CARE INSTRUCTIONS (if applicable):  Keep a dry clean dressing on the anesthesia/puncture wound site if there is drainage.  Once the wound has quit draining you may leave it open to air.  Generally you should leave the bandage intact for twenty four hours unless there is drainage.  If the epidural site drains for more than 36-48 hours please call the anesthesia department.  QUESTIONS?:  Please feel free to call your physician or the hospital operator if you have any questions, and they will be happy to assist you.        Colonoscopy, Adult, Care After This sheet gives you information about how to care for yourself after your procedure. Your doctor may also give you more specific instructions. If you have problems or questions, call your doctor. Follow these instructions at home: General instructions   For the first 24 hours after the procedure: ? Do not drive or use machinery. ? Do not sign important documents. ? Do not drink alcohol. ? Do your daily activities more slowly than normal. ? Eat foods that are soft and easy to digest. ? Rest  often.  Take over-the-counter or prescription medicines only as told by your doctor.  It is up to you to get the results of your procedure. Ask your doctor, or the department performing the procedure, when your results will be ready. To help cramping and bloating:  Try walking around.  Put heat on your belly (abdomen) as told by your doctor. Use a heat source that your doctor recommends, such as a moist heat pack or a heating pad. ? Put a towel between your skin and the heat source. ? Leave the heat on for 20-30 minutes. ? Remove the heat if your skin turns bright red. This is especially important if you cannot feel pain, heat, or cold. You can get burned. Eating and drinking  Drink enough fluid to keep your pee (urine) clear or pale yellow.  Return to your normal diet as told by your doctor. Avoid heavy or fried foods that are hard to digest.  Avoid drinking alcohol for as long as told by your doctor. Contact a doctor if:  You have blood in your poop (stool) 2-3 days after the procedure. Get help right away if:  You have more than a small amount of blood in your poop.  You see large clumps of tissue (blood clots) in your poop.  Your belly is swollen.  You feel sick to your stomach (nauseous).  You throw up (vomit).  You have a fever.  You have belly pain that gets worse, and medicine does not help your pain.  This information is not intended to replace advice given to you by your health care provider. Make sure you discuss any questions you have with your health care provider. Document Released: 05/25/2010 Document Revised: 01/15/2016 Document Reviewed: 01/15/2016 Elsevier Interactive Patient Education  2017 [employer]     Diverticulitis Diverticulitis is when small pockets in your large intestine (colon) get infected or swollen. This causes stomach pain and watery poop (diarrhea). These pouches are called diverticula. They form in people who have a condition called  diverticulosis. Follow these instructions at home: Medicines  Take over-the-counter and prescription medicines only as told by your doctor. These include: ? Antibiotics. ? Pain medicines. ? Fiber pills. ? Probiotics. ? Stool softeners.  Do not drive or use heavy machinery while taking prescription pain medicine.  If you were prescribed an antibiotic, take it as told. Do not stop taking it even if you feel better. General instructions  Follow a diet as told by your doctor.  When you feel better, your doctor may tell you to change your diet. You may need to eat a lot of fiber. Fiber makes it easier to poop (have bowel movements). Healthy foods with fiber include: ? Berries. ? Beans. ? Lentils. ? Green vegetables.  Exercise 3 or more times a week. Aim for 30 minutes each time. Exercise enough to sweat and make your heart beat faster.  Keep all follow-up visits as told. This is important. You may need to have an exam of the large intestine. This is called a colonoscopy. Contact a doctor if:  Your pain does not get better.  You have a hard time eating or drinking.  You are not pooping like normal. Get help right away if:  Your pain gets worse.  Your problems do not get better.  Your problems get worse very fast.  You have a fever.  You throw up (vomit) more than one time.  You have poop that is: ? Bloody. ? Black. ? Tarry. Summary  Diverticulitis is when small pockets in your large intestine (colon) get infected or swollen.  Take medicines only as told by your doctor.  Follow a diet as told by your doctor. This information is not intended to replace advice given to you by your health care provider. Make sure you discuss any questions you have with your health care provider. Document Released: 10/09/2007 Document Revised: 05/09/2016 Document Reviewed: 05/09/2016 Elsevier Interactive Patient Education  2017 [employer]    Diverticulosis Diverticulosis is a  condition that develops when small pouches (diverticula) form in the wall of the large intestine (colon). The colon is where water is absorbed and stool is formed. The pouches form when the inside layer of the colon pushes through weak spots in the outer layers of the colon. You may have a few pouches or many of them. What are the causes? The cause of this condition is not known. What increases the risk? The following factors may make you more likely to develop this condition:  Being older than age 60. Your risk for this condition increases with age. Diverticulosis is rare among people younger than age 61. By age 30, many people have it.  Eating a low-fiber diet.  Having frequent constipation.  Being overweight.  Not getting enough exercise.  Smoking.  Taking over-the-counter pain medicines, like aspirin and ibuprofen.  Having a family history of diverticulosis.  What are the signs or symptoms? In most people, there are no symptoms of this condition. If you do have  symptoms, they may include:  Bloating.  Cramps in the abdomen.  Constipation or diarrhea.  Pain in the lower left side of the abdomen.  How is this diagnosed? This condition is most often diagnosed during an exam for other colon problems. Because diverticulosis usually has no symptoms, it often cannot be diagnosed independently. This condition may be diagnosed by:  Using a flexible scope to examine the colon (colonoscopy).  Taking an X-ray of the colon after dye has been put into the colon (barium enema).  Doing a CT scan.  How is this treated? You may not need treatment for this condition if you have never developed an infection related to diverticulosis. If you have had an infection before, treatment may include:  Eating a high-fiber diet. This may include eating more fruits, vegetables, and grains.  Taking a fiber supplement.  Taking a live bacteria supplement (probiotic).  Taking medicine to relax  your colon.  Taking antibiotic medicines.  Follow these instructions at home:  Drink 6-8 glasses of water or more each day to prevent constipation.  Try not to strain when you have a bowel movement.  If you have had an infection before: ? Eat more fiber as directed by your health care provider or your diet and nutrition specialist (dietitian). ? Take a fiber supplement or probiotic, if your health care provider approves.  Take over-the-counter and prescription medicines only as told by your health care provider.  If you were prescribed an antibiotic, take it as told by your health care provider. Do not stop taking the antibiotic even if you start to feel better.  Keep all follow-up visits as told by your health care provider. This is important. Contact a health care provider if:  You have pain in your abdomen.  You have bloating.  You have cramps.  You have not had a bowel movement in 3 days. Get help right away if:  Your pain gets worse.  Your bloating becomes very bad.  You have a fever or chills, and your symptoms suddenly get worse.  You vomit.  You have bowel movements that are bloody or black.  You have bleeding from your rectum. Summary  Diverticulosis is a condition that develops when small pouches (diverticula) form in the wall of the large intestine (colon).  You may have a few pouches or many of them.  This condition is most often diagnosed during an exam for other colon problems.  If you have had an infection related to diverticulosis, treatment may include increasing the fiber in your diet, taking supplements, or taking medicines. This information is not intended to replace advice given to you by your health care provider. Make sure you discuss any questions you have with your health care provider. Document Released: 01/18/2004 Document Revised: 03/11/2016 Document Reviewed: 03/11/2016 Elsevier Interactive Patient Education  2017 [place].     Colon Polyps Polyps are tissue growths inside the body. Polyps can grow in many places, including the large intestine (colon). A polyp may be a round bump or a mushroom-shaped growth. You could have one polyp or several. Most colon polyps are noncancerous (benign). However, some colon polyps can become cancerous over time. What are the causes? The exact cause of colon polyps is not known. What increases the risk? This condition is more likely to develop in people who:  Have a family history of colon cancer or colon polyps.  Are older than 34 or older than 45 if they are African American.  Have  inflammatory bowel disease, such as ulcerative colitis or Crohn disease.  Are overweight.  Smoke cigarettes.  Do not get enough exercise.  Drink too much alcohol.  Eat a diet that is: ? High in fat and red meat. ? Low in fiber.  Had childhood cancer that was treated with abdominal radiation.  What are the signs or symptoms? Most polyps do not cause symptoms. If you have symptoms, they may include:  Blood coming from your rectum when having a bowel movement.  Blood in your stool.The stool may look dark red or black.  A change in bowel habits, such as constipation or diarrhea.  How is this diagnosed? This condition is diagnosed with a colonoscopy. This is a procedure that uses a lighted, flexible scope to look at the inside of your colon. How is this treated? Treatment for this condition involves removing any polyps that are found. Those polyps will then be tested for cancer. If cancer is found, your health care provider will talk to you about options for colon cancer treatment. Follow these instructions at home: Diet  Eat plenty of fiber, such as fruits, vegetables, and whole grains.  Eat foods that are high in calcium and vitamin D, such as milk, cheese, yogurt, eggs, liver, fish, and broccoli.  Limit foods high in fat, red meats, and processed meats, such as hot  dogs, sausage, bacon, and lunch meats.  Maintain a healthy weight, or lose weight if recommended by your health care provider. General instructions  Do not smoke cigarettes.  Do not drink alcohol excessively.  Keep all follow-up visits as told by your health care provider. This is important. This includes keeping regularly scheduled colonoscopies. Talk to your health care provider about when you need a colonoscopy.  Exercise every day or as told by your health care provider. Contact a health care provider if:  You have new or worsening bleeding during a bowel movement.  You have new or increased blood in your stool.  You have a change in bowel habits.  You unexpectedly lose weight. This information is not intended to replace advice given to you by your health care provider. Make sure you discuss any questions you have with your health care provider. Document Released: 01/17/2004 Document Revised: 09/28/2015 Document Reviewed: 03/13/2015 Elsevier Interactive Patient Education  [employer].

## 2017-05-19 ENCOUNTER — Telehealth (INDEPENDENT_AMBULATORY_CARE_PROVIDER_SITE_OTHER): Payer: Self-pay | Admitting: *Deleted

## 2017-05-19 NOTE — Telephone Encounter (Signed)
Per [facility] op note, patient needs OV in 2 weeks

## 2017-05-20 ENCOUNTER — Encounter (INDEPENDENT_AMBULATORY_CARE_PROVIDER_SITE_OTHER): Payer: Self-pay | Admitting: Internal Medicine

## 2017-05-20 ENCOUNTER — Encounter (HOSPITAL_COMMUNITY): Payer: Self-pay | Admitting: Internal Medicine

## 2017-05-20 NOTE — Telephone Encounter (Signed)
Patient was given an appointment for 06/04/17 at 11:30am with Deberah Castle, NP.  A letter was mailed to the patient.

## 2017-05-28 ENCOUNTER — Encounter (INDEPENDENT_AMBULATORY_CARE_PROVIDER_SITE_OTHER): Payer: Self-pay | Admitting: Internal Medicine

## 2017-05-28 ENCOUNTER — Ambulatory Visit (INDEPENDENT_AMBULATORY_CARE_PROVIDER_SITE_OTHER): Payer: Medicare Other | Admitting: Internal Medicine

## 2017-05-28 VITALS — BP 128/68 | HR 84 | Temp 98.8°F | Resp 18 | Ht 63.0 in | Wt 143.0 lb

## 2017-05-28 DIAGNOSIS — K5732 Diverticulitis of large intestine without perforation or abscess without bleeding: Secondary | ICD-10-CM

## 2017-05-28 MED ORDER — METRONIDAZOLE 500 MG PO TABS: 500.0000 mg | ORAL_TABLET | Freq: Two times a day (BID) | ORAL | 0 refills | Status: DC

## 2017-05-28 MED ORDER — CIPROFLOXACIN HCL 500 MG PO TABS: 500.0000 mg | ORAL_TABLET | Freq: Two times a day (BID) | ORAL | 0 refills | Status: DC

## 2017-05-28 NOTE — Progress Notes (Signed)
Presenting complaint;  Follow-up for sigmoid diverticulitis.  Database and subjective:  Patient is 62 year old Caucasian female with multiple medical problems including O2 dependent COPD who is been experiencing lower abdominal pain for at least 3 months.  She had CT back in October 2013 revealing wall thickening to sigmoid colon.  She was felt to have diverticulitis.  She has been treated with antibiotics and do okay with incomplete symptom resolution.  She underwent colonoscopy on 05/16/2017 which revealed changes of sigmoid diverticulitis.  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  She was begun on Cipro and metronidazole. She is here for scheduled visit.  Patient states she is feeling 50% better.  She continues to have pain across lower abdomen more in left lower quadrant.  Pain intensity varies.  At times it has been significant.  Today she has minimal pain.  She continues to complain pain over left lower rib cage laterally.  She is having 5-6 stools per day.  She passes soft stools as well as mucus and scant amount of blood.  She remains with fair appetite.  She says she has lost 6 pounds this month.  She states she used to be 95 pounds but since she has been on prednisone she has gained close to 50 pounds.  Now she is being tapered off prednisone slowly.  Since her colonoscopy she has taken Advil only on one occasion. She is not having any side effects with Cipro and metronidazole.   Current Medications: Outpatient Encounter Medications as of 05/28/2017  Medication Sig  . acyclovir (ZOVIRAX) 800 MG tablet Take 800 mg by mouth 5 (five) times daily as needed (for break outs).   Marland Kitchen albuterol (PROAIR HFA) 108 (90 BASE) MCG/ACT inhaler Inhale 2 puffs into the lungs every 6 (six) hours as needed for wheezing or shortness of breath.   . ALPRAZolam (XANAX) 0.25 MG tablet Take 0.25 mg by mouth 2 (two) times daily as needed for anxiety.  Marland Kitchen aspirin 81 MG tablet Take 1 tablet (81 mg  total) by mouth daily.  Marland Kitchen atorvastatin (LIPITOR) 40 MG tablet TAKE (1) TABLET BY MOUTH ONCE DAILY. (Patient taking differently: Take 40 mg by mouth at bedtime)  . carboxymethylcellulose (REFRESH) 1 % ophthalmic solution Place 2 drops into both eyes 3 (three) times daily as needed (for dry eyes).  . cetirizine (ZYRTEC) 10 MG tablet Take 10 mg by mouth daily.    . ciprofloxacin (CIPRO) 500 MG tablet Take 1 tablet (500 mg total) by mouth 2 (two) times daily.  . clonazePAM (KLONOPIN) 0.5 MG tablet Take 1 tablet (0.5 mg total) by mouth at bedtime as needed for anxiety.  Marland Kitchen diltiazem (CARDIZEM CD) 240 MG 24 hr capsule Take 1 capsule (240 mg total) by mouth daily.  Marland Kitchen escitalopram (LEXAPRO) 20 MG tablet Take 20 mg by mouth daily.    . Fluticasone-Salmeterol (ADVAIR) 250-50 MCG/DOSE AEPB Inhale 1 puff into the lungs 2 (two) times daily.   Marland Kitchen guaiFENesin (MUCINEX) 600 MG 12 hr tablet Take 600 mg by mouth 2 (two) times daily as needed for cough or to loosen phlegm.   Marland Kitchen ipratropium-albuterol (DUONEB) 0.5-2.5 (3) MG/3ML SOLN Take 3 mLs by nebulization 3 (three) times daily. (Patient taking differently: Take 3 mLs by nebulization 3 (three) times daily as needed (for shortness of breath or wheezing). )  . metroNIDAZOLE (FLAGYL) 500 MG tablet Take 1 tablet (500 mg total) by mouth 2 (two) times daily.  . montelukast (SINGULAIR) 10 MG tablet Take 1  tablet (10 mg total) by mouth at bedtime.  . Multiple Vitamins-Minerals (MULTIVITAMINS THER. W/MINERALS) TABS Take 1 tablet by mouth daily.    . ondansetron (ZOFRAN) 4 MG tablet Take 1 tablet (4 mg total) every 8 (eight) hours as needed by mouth for nausea or vomiting.  Marland Kitchen oxybutynin (DITROPAN) 5 MG tablet Take 5 mg by mouth daily.   . OXYGEN Inhale 4 L into the lungs continuous.  . predniSONE (DELTASONE) 5 MG tablet Take 5 mg by mouth daily.   . roflumilast (DALIRESP) 500 MCG TABS tablet Take 500 mcg by mouth daily.    . sodium chloride (OCEAN) 0.65 % SOLN nasal spray  Place 2-3 sprays into both nostrils as needed for congestion.  Marland Kitchen SPIRIVA RESPIMAT 2.5 MCG/ACT AERS Inhale 2 puffs into the lungs daily.   . traMADol (ULTRAM) 50 MG tablet Take 50 mg by mouth every 6 (six) hours as needed for moderate pain.   . traZODone (DESYREL) 50 MG tablet Take 50 mg by mouth at bedtime as needed for sleep.  Marland Kitchen triamcinolone (NASACORT ALLERGY 24HR) 55 MCG/ACT AERO nasal inhaler Place 2 sprays into the nose 2 (two) times daily as needed (for allergies).    No facility-administered encounter medications on file as of 05/28/2017.      Objective: Blood pressure 128/68, pulse 84, temperature 98.8 F (37.1 C), temperature source Oral, resp. rate 18, height 5\' 3"  (1.6 m), weight 143 lb (64.9 kg). Patient is alert and in no acute distress. She is on nasal O2. Conjunctiva is pink. Sclera is nonicteric Oropharyngeal mucosa is normal. No neck masses or thyromegaly noted. Cardiac exam with regular rhythm normal S1 and S2. No murmur or gallop noted. Auscultation of lungs revealed diminished intensity of breath sounds bilaterally without rales or rhonchi. Abdomen is full.  She has small umbilical hernia and it is reducible.  Bowel sounds are normal.  On palpation abdomen is soft with mild tenderness at LLQ and RLQ.  She has some guarding at LLQ but no rebound.  No organomegaly or masses. No LE edema or clubbing noted.    Assessment:  #1.  Persistent lower abdominal pain felt to be due to incomplete resolution of diverticulitis noted on colonoscopy 12 days ago. Symptomatically she is 50% better but abdominal exam is not normal.  I do not believe her diverticulitis has resolved.  Her symptoms are not suggestive of C. difficile colitis.  I feel she needs to continue antibiotic therapy.  Augmentin would be a reasonable alternative however I am afraid she may end up with C. difficile colitis.  Therefore I am inclined to continue her on Cipro and metronidazole for another 2  weeks.   Plan:  Patient given prescription for Cipro 500 mg p.o. twice daily for [redacted] weeks along with metronidazole 500 mg p.o. twice daily for 2 weeks. Patient advised to keep symptom diary every day until office visit in 2 weeks. Patient will call if abdominal pain gets worse or she has temp greater than 101 F.

## 2017-05-28 NOTE — Patient Instructions (Addendum)
Notify if you have temp greater than 101 F or abdominal pain becomes worse.

## 2017-06-03 ENCOUNTER — Ambulatory Visit (INDEPENDENT_AMBULATORY_CARE_PROVIDER_SITE_OTHER): Payer: Medicare Other | Admitting: Internal Medicine

## 2017-06-04 ENCOUNTER — Ambulatory Visit (INDEPENDENT_AMBULATORY_CARE_PROVIDER_SITE_OTHER): Payer: Medicare Other | Admitting: Internal Medicine

## 2017-06-12 ENCOUNTER — Ambulatory Visit (INDEPENDENT_AMBULATORY_CARE_PROVIDER_SITE_OTHER): Payer: Medicare Other | Admitting: Internal Medicine

## 2017-06-12 ENCOUNTER — Encounter (INDEPENDENT_AMBULATORY_CARE_PROVIDER_SITE_OTHER): Payer: Self-pay | Admitting: Internal Medicine

## 2017-06-12 ENCOUNTER — Encounter (INDEPENDENT_AMBULATORY_CARE_PROVIDER_SITE_OTHER): Payer: Self-pay | Admitting: *Deleted

## 2017-06-12 VITALS — BP 118/76 | HR 86 | Temp 98.5°F | Resp 18 | Ht 63.0 in | Wt 139.2 lb

## 2017-06-12 DIAGNOSIS — K5732 Diverticulitis of large intestine without perforation or abscess without bleeding: Secondary | ICD-10-CM

## 2017-06-12 DIAGNOSIS — K5792 Diverticulitis of intestine, part unspecified, without perforation or abscess without bleeding: Secondary | ICD-10-CM

## 2017-06-12 DIAGNOSIS — R1084 Generalized abdominal pain: Secondary | ICD-10-CM

## 2017-06-12 DIAGNOSIS — K588 Other irritable bowel syndrome: Secondary | ICD-10-CM

## 2017-06-12 MED ORDER — DICYCLOMINE HCL 10 MG PO CAPS: 10.0000 mg | ORAL_CAPSULE | Freq: Three times a day (TID) | ORAL | 2 refills | Status: DC | PRN

## 2017-06-12 NOTE — Patient Instructions (Addendum)
Abdominopelvic CT with oral contrast to be scheduled.

## 2017-06-12 NOTE — Progress Notes (Signed)
Presenting complaint;  Follow-up for abdominal pain/diverticulitis.  Database and subjective:  Patient is 62 year old Caucasian female who is here for scheduled visit.  She has been struggling with abdominal pain since October 2013.  She was felt to have diverticulitis and treated with antibiotics but did not have complete resolution of her symptoms.  She underwent colonoscopy on 05/16/2017 and exam revealed changes of sigmoid diverticulitis.  Patient was begun on Cipro and metronidazole.  She also had 2 polyps removed and these were tubular adenomas. Patient return for follow-up visit on 05/28/2017 and she was still having low-grade fever and lower abdominal pain.  She was advised to continue antibiotics for another 2 weeks.  Patient states she does not feel any better.  She has abdominal pain and cramping virtually every day.  Most of her pain is located in left lower quadrant but she has generalized pain at times.  She also feels bloated.  She generally has 2 bowel movements daily which she would consider normal irregular stools.  However she passes small amount of stool every time she urinates.  She also has having intermittent explosive bowel movements.  On one occasion she noted scant amount of dark blood with a bowel movement.  She remains with low-grade fever.  She states her temperature states just below 100.  She has lost 4 pounds since her last visit.  She has intermittent nausea without vomiting.  She is not taking any NSAIDs. She also complains of painful knot in left flank region which comes and goes.  She does not have that lump today.   Current Medications: Outpatient Encounter Medications as of 06/12/2017  Medication Sig  . acyclovir (ZOVIRAX) 800 MG tablet Take 800 mg by mouth 5 (five) times daily as needed (for break outs).   Marland Kitchen albuterol (PROAIR HFA) 108 (90 BASE) MCG/ACT inhaler Inhale 2 puffs into the lungs every 6 (six) hours as needed for wheezing or shortness of breath.   .  ALPRAZolam (XANAX) 0.25 MG tablet Take 0.25 mg by mouth 2 (two) times daily as needed for anxiety.  Marland Kitchen aspirin 81 MG tablet Take 1 tablet (81 mg total) by mouth daily.  Marland Kitchen atorvastatin (LIPITOR) 40 MG tablet TAKE (1) TABLET BY MOUTH ONCE DAILY. (Patient taking differently: Take 40 mg by mouth at bedtime)  . carboxymethylcellulose (REFRESH) 1 % ophthalmic solution Place 2 drops into both eyes 3 (three) times daily as needed (for dry eyes).  . cetirizine (ZYRTEC) 10 MG tablet Take 10 mg by mouth daily.    . ciprofloxacin (CIPRO) 500 MG tablet Take 1 tablet (500 mg total) by mouth 2 (two) times daily.  . clonazePAM (KLONOPIN) 0.5 MG tablet Take 1 tablet (0.5 mg total) by mouth at bedtime as needed for anxiety.  Marland Kitchen diltiazem (CARDIZEM CD) 240 MG 24 hr capsule Take 1 capsule (240 mg total) by mouth daily.  Marland Kitchen escitalopram (LEXAPRO) 20 MG tablet Take 20 mg by mouth daily.    . Fluticasone-Salmeterol (ADVAIR) 250-50 MCG/DOSE AEPB Inhale 1 puff into the lungs 2 (two) times daily.   Marland Kitchen guaiFENesin (MUCINEX) 600 MG 12 hr tablet Take 600 mg by mouth 2 (two) times daily as needed for cough or to loosen phlegm.   Marland Kitchen ipratropium-albuterol (DUONEB) 0.5-2.5 (3) MG/3ML SOLN Take 3 mLs by nebulization 3 (three) times daily. (Patient taking differently: Take 3 mLs by nebulization 3 (three) times daily as needed (for shortness of breath or wheezing). )  . metroNIDAZOLE (FLAGYL) 500 MG tablet Take 1 tablet (500  mg total) by mouth 2 (two) times daily.  . montelukast (SINGULAIR) 10 MG tablet Take 1 tablet (10 mg total) by mouth at bedtime.  . ondansetron (ZOFRAN) 4 MG tablet Take 1 tablet (4 mg total) every 8 (eight) hours as needed by mouth for nausea or vomiting.  . OXYGEN Inhale 4 L into the lungs continuous.  . predniSONE (DELTASONE) 5 MG tablet Take 5 mg by mouth daily.   . roflumilast (DALIRESP) 500 MCG TABS tablet Take 500 mcg by mouth daily.    . sodium chloride (OCEAN) 0.65 % SOLN nasal spray Place 2-3 sprays into  both nostrils as needed for congestion.  Marland Kitchen SPIRIVA RESPIMAT 2.5 MCG/ACT AERS Inhale 2 puffs into the lungs daily.   . traMADol (ULTRAM) 50 MG tablet Take 50 mg by mouth every 6 (six) hours as needed for moderate pain.   . traZODone (DESYREL) 50 MG tablet Take 50 mg by mouth at bedtime as needed for sleep.  Marland Kitchen triamcinolone (NASACORT ALLERGY 24HR) 55 MCG/ACT AERO nasal inhaler Place 2 sprays into the nose 2 (two) times daily as needed (for allergies).   . Multiple Vitamins-Minerals (MULTIVITAMINS THER. W/MINERALS) TABS Take 1 tablet by mouth daily.    Marland Kitchen oxybutynin (DITROPAN) 5 MG tablet Take 5 mg by mouth daily.    No facility-administered encounter medications on file as of 06/12/2017.      Objective: Blood pressure 118/76, pulse 86, temperature 98.5 F (36.9 C), temperature source Oral, resp. rate 18, height 5\' 3"  (1.6 m), weight 139 lb 3.2 oz (63.1 kg). Patient is alert and in no acute distress. She is using a nasal O2. Conjunctiva is pink. Sclera is nonicteric Oropharyngeal mucosa is normal. No neck masses or thyromegaly noted. Cardiac exam with regular rhythm normal S1 and S2. No murmur or gallop noted. Auscultation of lungs revealed diminished intensity of breath sounds bilaterally.  No rales or rhonchi noted. Abdomen is full.  She has 2 small hypopigmented scars in left upper quadrant.  She has small umbilical hernia which is reducible.  Bowel sounds are normal.  She has generalized tenderness which is mild but most pronounced in left lower quadrant where she has some guarding.  No organomegaly or masses.  No lump or bump palpated in left flank. No LE edema or clubbing noted.   Assessment:  #1.  Sigmoid diverticulitis.  She has been on antibiotics for 4 weeks but symptoms have not completely resolved.  She has frequent stooling but all of her stools are soft.  Some of her symptoms clearly suggest irritable bowel syndrome but it would not explain low-grade fever which interestingly she  has had for several months.  She is not responding to antibiotic therapy one has to wonder if she has another mechanism to explain her symptoms such as ischemic injury or is associated with diverticulosis.  Reevaluation with abdominopelvic CT is warranted.  #2.  Generalized abdominal pain.  This pain cannot be explained on the basis of sigmoid diverticulitis.  Some of her pain may be due to IBS.  Plan:  Continue Cipro and metronidazole until prescription runs out which is expected in 3 days. Dicyclomine 10 mg by mouth 30 minutes before each meal.  She was informed of potential side effects.  If she becomes constipated she can decrease dose to 10 mg twice daily. Abdominopelvic CT with oral contrast as patient is allergic to iodine. I will be contacting patient results of CT and further recommendations. Office visit in 8 weeks.

## 2017-06-13 ENCOUNTER — Encounter (INDEPENDENT_AMBULATORY_CARE_PROVIDER_SITE_OTHER): Payer: Self-pay | Admitting: Internal Medicine

## 2017-06-13 ENCOUNTER — Encounter: Payer: Self-pay | Admitting: Cardiology

## 2017-06-16 ENCOUNTER — Ambulatory Visit (HOSPITAL_COMMUNITY)
Admission: RE | Admit: 2017-06-16 | Discharge: 2017-06-16 | Disposition: A | Discharge status: Home / Self Care | Payer: Medicare Other | Source: Ambulatory Visit | Attending: Internal Medicine | Admitting: Internal Medicine

## 2017-06-16 DIAGNOSIS — K5730 Diverticulosis of large intestine without perforation or abscess without bleeding: Secondary | ICD-10-CM | POA: Diagnosis not present

## 2017-06-16 DIAGNOSIS — K8020 Calculus of gallbladder without cholecystitis without obstruction: Secondary | ICD-10-CM | POA: Insufficient documentation

## 2017-06-16 DIAGNOSIS — K5732 Diverticulitis of large intestine without perforation or abscess without bleeding: Secondary | ICD-10-CM

## 2017-06-16 DIAGNOSIS — R1084 Generalized abdominal pain: Secondary | ICD-10-CM | POA: Diagnosis not present

## 2017-06-26 DIAGNOSIS — J449 Chronic obstructive pulmonary disease, unspecified: Secondary | ICD-10-CM | POA: Diagnosis not present

## 2017-06-26 DIAGNOSIS — I10 Essential (primary) hypertension: Secondary | ICD-10-CM | POA: Diagnosis not present

## 2017-06-26 DIAGNOSIS — J9611 Chronic respiratory failure with hypoxia: Secondary | ICD-10-CM | POA: Diagnosis not present

## 2017-06-26 DIAGNOSIS — F419 Anxiety disorder, unspecified: Secondary | ICD-10-CM | POA: Diagnosis not present

## 2017-07-08 ENCOUNTER — Telehealth (INDEPENDENT_AMBULATORY_CARE_PROVIDER_SITE_OTHER): Payer: Self-pay | Admitting: *Deleted

## 2017-07-08 NOTE — Telephone Encounter (Signed)
Forwarded to Dr.Rehman. 

## 2017-07-08 NOTE — Telephone Encounter (Signed)
Patient's daughter called and left a message stating her mom wanted her to call and talk with Dr Laural Golden about what the next step will be. Per daughter she has patient's CT. Please advise [phone]

## 2017-07-08 NOTE — Telephone Encounter (Signed)
Discussed with patient's daughter Dr. Wende Bushy. Will monitor symptoms closely. Office visit with me in 2 months.

## 2017-07-09 NOTE — Telephone Encounter (Signed)
Patient already has an 8 week follow up on 08/14/17. Will this appointment be okay?

## 2017-07-11 ENCOUNTER — Other Ambulatory Visit (INDEPENDENT_AMBULATORY_CARE_PROVIDER_SITE_OTHER): Payer: Self-pay | Admitting: *Deleted

## 2017-07-11 MED ORDER — DICYCLOMINE HCL 10 MG PO CAPS: 10.0000 mg | ORAL_CAPSULE | Freq: Three times a day (TID) | ORAL | 2 refills | Status: DC | PRN

## 2017-07-14 NOTE — Telephone Encounter (Signed)
Per Dr.Rehman - this date is just fine.

## 2017-08-14 ENCOUNTER — Ambulatory Visit (INDEPENDENT_AMBULATORY_CARE_PROVIDER_SITE_OTHER): Payer: Medicare Other | Admitting: Internal Medicine

## 2017-08-14 ENCOUNTER — Encounter (INDEPENDENT_AMBULATORY_CARE_PROVIDER_SITE_OTHER): Payer: Self-pay | Admitting: Internal Medicine

## 2017-08-14 VITALS — BP 120/70 | HR 88 | Temp 98.0°F | Resp 18 | Ht 63.0 in | Wt 139.9 lb

## 2017-08-14 DIAGNOSIS — Z8719 Personal history of other diseases of the digestive system: Secondary | ICD-10-CM

## 2017-08-14 DIAGNOSIS — K580 Irritable bowel syndrome with diarrhea: Secondary | ICD-10-CM

## 2017-08-14 DIAGNOSIS — K921 Melena: Secondary | ICD-10-CM | POA: Diagnosis not present

## 2017-08-14 MED ORDER — DICYCLOMINE HCL 10 MG PO CAPS: 10.0000 mg | ORAL_CAPSULE | Freq: Three times a day (TID) | ORAL | 3 refills | Status: DC | PRN

## 2017-08-14 MED ORDER — ASPIRIN 81 MG PO TABS: 81.0000 mg | ORAL_TABLET | Freq: Every day | ORAL | 3 refills | Status: DC

## 2017-08-14 NOTE — Progress Notes (Addendum)
Presenting complaint;  Follow-up for diverticulitis and IBS.  Subjective:  Patient is 62 year old Caucasian female with advanced O2 dependent COPD who is for scheduled visit.  She was last seen about 8 weeks ago.  She was treated with antibiotics for several weeks.  She finished antibiotics a few days after her last visit of 06/12/2017 and she was begun on dicyclomine. Patient states dicyclomine which has helped great deal. She generally takes 2 doses a day.  She has noted significant decrease in pain frequency and intensity.  Now she is not having constant or daily pain.  Pain is primarily left side of her abdomen.  She has an average of 4 stools per day.  Stools are semi-formed to formed.  She is not having any side effects with dicyclomine.  Her appetite is fair.  She has not lost any weight since her last visit.  Prednisone dose is now down to 5 mg/day.  She states dose is being dropped by 1 mg every 2 months.  She continues to experience hematochezia every morning.  She passes small amount of blood and mucus with her bowel movement.  She does not have blood rest of the day except only on one occasion.  She states she now has sporadic low-grade fever.  Previously she was having fever every day.    Current Medications: Outpatient Encounter Medications as of 08/14/2017  Medication Sig  . acyclovir (ZOVIRAX) 800 MG tablet Take 800 mg by mouth 5 (five) times daily as needed (for break outs).   Marland Kitchen albuterol (PROAIR HFA) 108 (90 BASE) MCG/ACT inhaler Inhale 2 puffs into the lungs every 6 (six) hours as needed for wheezing or shortness of breath.   . ALPRAZolam (XANAX) 0.25 MG tablet Take 0.25 mg by mouth 2 (two) times daily as needed for anxiety.  Marland Kitchen aspirin 81 MG tablet Take 1 tablet (81 mg total) by mouth daily.  Marland Kitchen atorvastatin (LIPITOR) 40 MG tablet TAKE (1) TABLET BY MOUTH ONCE DAILY. (Patient taking differently: Take 40 mg by mouth at bedtime)  . carboxymethylcellulose (REFRESH) 1 % ophthalmic  solution Place 2 drops into both eyes 3 (three) times daily as needed (for dry eyes).  . cetirizine (ZYRTEC) 10 MG tablet Take 10 mg by mouth daily.    . clonazePAM (KLONOPIN) 0.5 MG tablet Take 1 tablet (0.5 mg total) by mouth at bedtime as needed for anxiety.  . dicyclomine (BENTYL) 10 MG capsule Take 1 capsule (10 mg total) by mouth 3 (three) times daily as needed for spasms.  Marland Kitchen diltiazem (CARDIZEM CD) 240 MG 24 hr capsule Take 1 capsule (240 mg total) by mouth daily.  Marland Kitchen escitalopram (LEXAPRO) 20 MG tablet Take 20 mg by mouth daily.    . Fluticasone-Salmeterol (ADVAIR) 250-50 MCG/DOSE AEPB Inhale 1 puff into the lungs 2 (two) times daily.   Marland Kitchen guaiFENesin (MUCINEX) 600 MG 12 hr tablet Take 600 mg by mouth 2 (two) times daily as needed for cough or to loosen phlegm.   Marland Kitchen ipratropium-albuterol (DUONEB) 0.5-2.5 (3) MG/3ML SOLN Take 3 mLs by nebulization 3 (three) times daily. (Patient taking differently: Take 3 mLs by nebulization 3 (three) times daily as needed (for shortness of breath or wheezing). )  . montelukast (SINGULAIR) 10 MG tablet Take 1 tablet (10 mg total) by mouth at bedtime.  . Multiple Vitamins-Minerals (MULTIVITAMINS THER. W/MINERALS) TABS Take 1 tablet by mouth daily.    . ondansetron (ZOFRAN) 4 MG tablet Take 1 tablet (4 mg total) every 8 (eight) hours as  needed by mouth for nausea or vomiting.  Marland Kitchen oxybutynin (DITROPAN) 5 MG tablet Take 5 mg by mouth daily.   . OXYGEN Inhale 4 L into the lungs continuous.  . predniSONE (DELTASONE) 5 MG tablet Take 5 mg by mouth daily.   . roflumilast (DALIRESP) 500 MCG TABS tablet Take 500 mcg by mouth daily.    . sodium chloride (OCEAN) 0.65 % SOLN nasal spray Place 2-3 sprays into both nostrils as needed for congestion.  Marland Kitchen SPIRIVA RESPIMAT 2.5 MCG/ACT AERS Inhale 2 puffs into the lungs daily.   . traMADol (ULTRAM) 50 MG tablet Take 50 mg by mouth every 6 (six) hours as needed for moderate pain.   . traZODone (DESYREL) 50 MG tablet Take 50 mg by  mouth at bedtime as needed for sleep.  Marland Kitchen triamcinolone (NASACORT ALLERGY 24HR) 55 MCG/ACT AERO nasal inhaler Place 2 sprays into the nose 2 (two) times daily as needed (for allergies).   . [DISCONTINUED] ciprofloxacin (CIPRO) 500 MG tablet Take 1 tablet (500 mg total) by mouth 2 (two) times daily. (Patient not taking: Reported on 08/14/2017)  . [DISCONTINUED] metroNIDAZOLE (FLAGYL) 500 MG tablet Take 1 tablet (500 mg total) by mouth 2 (two) times daily. (Patient not taking: Reported on 08/14/2017)   No facility-administered encounter medications on file as of 08/14/2017.      Objective: Blood pressure 120/70, pulse 88, temperature 98 F (36.7 C), temperature source Oral, resp. rate 18, height 5\' 3"  (1.6 m), weight 139 lb 14.4 oz (63.5 kg). Patient is alert and in no acute distress while sitting in chair. She is using nasal O2. Conjunctiva is pink. Sclera is nonicteric Oropharyngeal mucosa is normal. No neck masses or thyromegaly noted. Cardiac exam with regular rhythm normal S1 and S2. No murmur or gallop noted. Auscultation of lungs revealed diminished intensity of breath sounds bilaterally with few fine rhonchi.  No rales noted. Abdomen abdomen is full.  Bowel sounds are normal.  On palpation abdomen is soft.  She has mild tenderness at LUQ and LLQ but without guarding or rebound.  No organomegaly or masses. No LE edema or clubbing noted.   Assessment:  #1.  History of sigmoid diverticulitis.  Multiple sequential CTs have shown wall thickening.  Partial response to several weeks of antibiotic.  Colonoscopy in January 2019.  Did not show any changes of scattered or neoplasm.  Since she is high risk for surgery she is being monitored closely.  I have discussed her condition in detail with her daughter Dr. Wende Bushy who also reviewed her CT. Sigmoid colon needs to be reexamined to make sure she does not have occult neoplasm.  #2.  Irritable bowel syndrome.  She has had excellent response  to dicyclomine.  #3.  Recurrent hematochezia.  Does hemorrhoidal bleeding.  She could have low-grade colitis or scant.   Plan:  New prescription given for dicyclomine 10 mg before each meal.  Prescription given for 90 days with 3 refills. Given prescription for low-dose aspirin. Flexible sigmoidoscopy in 2-3 months monitored anesthesia care. And will call if she has worsening abdominal pain or frank rectal bleeding. OV in 6 months.

## 2017-08-14 NOTE — Patient Instructions (Signed)
Notify if you have change in his symptoms or worsening abdominal pain. Flexible sigmoidoscopy to be scheduled in June or July 2019.

## 2017-08-18 DIAGNOSIS — J9611 Chronic respiratory failure with hypoxia: Secondary | ICD-10-CM | POA: Diagnosis not present

## 2017-08-18 DIAGNOSIS — J449 Chronic obstructive pulmonary disease, unspecified: Secondary | ICD-10-CM | POA: Diagnosis not present

## 2017-08-18 DIAGNOSIS — F419 Anxiety disorder, unspecified: Secondary | ICD-10-CM | POA: Diagnosis not present

## 2017-08-18 DIAGNOSIS — I10 Essential (primary) hypertension: Secondary | ICD-10-CM | POA: Diagnosis not present

## 2017-08-20 ENCOUNTER — Other Ambulatory Visit (INDEPENDENT_AMBULATORY_CARE_PROVIDER_SITE_OTHER): Payer: Self-pay | Admitting: *Deleted

## 2017-08-20 DIAGNOSIS — K921 Melena: Secondary | ICD-10-CM | POA: Insufficient documentation

## 2017-08-20 DIAGNOSIS — K588 Other irritable bowel syndrome: Secondary | ICD-10-CM

## 2017-08-20 DIAGNOSIS — K5732 Diverticulitis of large intestine without perforation or abscess without bleeding: Secondary | ICD-10-CM | POA: Insufficient documentation

## 2017-08-21 ENCOUNTER — Encounter (INDEPENDENT_AMBULATORY_CARE_PROVIDER_SITE_OTHER): Payer: Self-pay | Admitting: *Deleted

## 2017-08-25 ENCOUNTER — Encounter (INDEPENDENT_AMBULATORY_CARE_PROVIDER_SITE_OTHER): Payer: Self-pay | Admitting: *Deleted

## 2017-08-25 NOTE — Telephone Encounter (Signed)
This encounter was created in error - please disregard.

## 2017-09-19 NOTE — Patient Instructions (Signed)
[name]  09/19/2017     @PREFPERIOPPHARMACY @   Your procedure is scheduled on 09/26/2017.  Report to [facility] at 8:00 A.M.  Call this number if you have problems the morning of surgery:  [phone]   Remember: [address] [facility] OFFICE     Take these medicines the morning of surgery with A SIP OF WATER Zovirax if needed, Albuterol inhaler and Duo neb, Xanax, Zyrtec, Klonopin, Bentyl, Daliresp  Ditropan, Cardizem, Lexapro, Advair if needed, Singulair, Zofran if needed, Nasocort, Ultram if needed    Do not wear jewelry, make-up or nail polish.  Do not wear lotions, powders, or perfumes, or deodorant.  Do not shave 48 hours prior to surgery.  Men may shave face and neck.  Do not bring valuables to the hospital.  [facility] is not responsible for any belongings or valuables.  Contacts, dentures or bridgework may not be worn into surgery.  Leave your suitcase in the car.  After surgery it may be brought to your room.  For patients admitted to the hospital, discharge time will be determined by your treatment team.  Patients discharged the day of surgery will not be allowed to drive home.    Please read over the following fact sheets that you were given. Anesthesia Post-op Instructions     PATIENT INSTRUCTIONS POST-ANESTHESIA  IMMEDIATELY FOLLOWING SURGERY:  Do not drive or operate machinery for the first twenty four hours after surgery.  Do not make any important decisions for twenty four hours after surgery or while taking narcotic pain medications or sedatives.  If you develop intractable nausea and vomiting or a severe headache please notify your doctor immediately.  FOLLOW-UP:  Please make an appointment with your surgeon as instructed. You do not need to follow up with anesthesia unless specifically instructed to do so.  WOUND CARE INSTRUCTIONS (if applicable):  Keep a dry clean dressing on the anesthesia/puncture wound site if there  is drainage.  Once the wound has quit draining you may leave it open to air.  Generally you should leave the bandage intact for twenty four hours unless there is drainage.  If the epidural site drains for more than 36-48 hours please call the anesthesia department.  QUESTIONS?:  Please feel free to call your physician or the hospital operator if you have any questions, and they will be happy to assist you.      Flexible Sigmoidoscopy Flexible sigmoidoscopy is a procedure to check the lower colon (sigmoid colon). The procedure is done using a short, flexible tube that has a small camera attached (sigmoidoscope). The sigmoidoscope is inserted into the anus and passed through the rectum into the sigmoid colon. The camera on the scope sends images to a TV monitor in the exam room. You may need this exam if you have had changes in your bowel habits, bleeding from your rectum, or pain in your abdomen. You may also need this test if your health care provider wants to check for abnormal growths in your rectum or lower colon. Tell a health care provider about:  Any allergies you have.  All medicines you are taking, including vitamins, herbs, eye drops, creams, and over-the-counter medicines.  Any problems you or family members have had with anesthetic medicines.  Any blood disorders you have.  Any surgeries you have had.  Any medical conditions you have.  Whether you are pregnant or may be pregnant. What are the risks? Generally, this is a  safe procedure. However, problems may occur, including:  Abdominal pain.  Bleeding.  Infection or inflammation in your colon.  A tear through your rectum or colon (perforation).  Allergic reactions to medicines.  Damage to other structures or organs.  What happens before the procedure?  Follow instructions from your health care provider about eating and drinking restrictions.  Ask your health care provider about: ? Changing or stopping your  regular medicines. This is especially important if you are taking diabetes medicines or blood thinners. ? Taking medicines such as aspirin and ibuprofen. These medicines can thin your blood. Do not take these medicines before your procedure if your health care provider instructs you not to.  Plan to have someone take you home from the hospital or clinic.  If you will be going home right after the procedure, plan to have someone with you for 24 hours.  You will need to follow a specific diet and use a laxative or an enema before the procedure (bowel prep). This is to clean out your colon. The bowel prep is often done the night before the procedure or the morning of the procedure. Follow instructions exactly as given by your health care provider.  You may need to have a rectal suppository or enema in the morning before your procedure. What happens during the procedure?  An IV tube may be inserted into one of your veins.  You may be given a medicine to help you relax (sedative).  You will lie on your side on the exam table. You may be asked to change positions during the procedure.  The sigmoidoscope will be lubricated and gently inserted into your anus.  Air will be injected into your colon, and the sigmoidoscope will be moved into your sigmoid colon. You may feel some pressure or cramping.  Your health care provider will check the monitor for any abnormal findings.  Your health care provider may take a small piece of tissue to check under a microscope (biopsy).  The scope will be slowly removed. The procedure may vary among health care providers and hospitals. What happens after the procedure?  Your blood pressure, heart rate, breathing rate, and blood oxygen level will be monitored until the medicines you were given have worn off.  Do not drive for 24 hours if you received a sedative. This information is not intended to replace advice given to you by your health care provider. Make  sure you discuss any questions you have with your health care provider. Document Released: 04/19/2000 Document Revised: 11/10/2015 Document Reviewed: 07/22/2015 Elsevier Interactive Patient Education  [employer].

## 2017-09-22 ENCOUNTER — Encounter (HOSPITAL_COMMUNITY)
Admission: RE | Admit: 2017-09-22 | Discharge: 2017-09-22 | Disposition: A | Discharge status: Home / Self Care | Payer: Medicare Other | Source: Ambulatory Visit | Attending: Internal Medicine | Admitting: Internal Medicine

## 2017-09-22 ENCOUNTER — Encounter (HOSPITAL_COMMUNITY): Payer: Self-pay

## 2017-09-22 ENCOUNTER — Other Ambulatory Visit: Payer: Self-pay

## 2017-09-22 DIAGNOSIS — Z01812 Encounter for preprocedural laboratory examination: Secondary | ICD-10-CM | POA: Diagnosis not present

## 2017-09-22 LAB — BASIC METABOLIC PANEL
Anion gap: 9 (ref 5–15)
BUN: 15 mg/dL (ref 6–20)
CALCIUM: 8.8 mg/dL — AB (ref 8.9–10.3)
CO2: 32 mmol/L (ref 22–32)
Chloride: 96 mmol/L — ABNORMAL LOW (ref 101–111)
Creatinine, Ser: 0.85 mg/dL (ref 0.44–1.00)
GFR calc Af Amer: >60 mL/min (ref >60)
GFR calc non Af Amer: >60 mL/min (ref >60)
Glucose, Bld: 150 mg/dL — ABNORMAL HIGH (ref 65–99)
Potassium: 3.9 mmol/L (ref 3.5–5.1)
Sodium: 137 mmol/L (ref 135–145)

## 2017-09-22 LAB — CBC
HEMATOCRIT: 37.2 % (ref 36.0–46.0)
Hemoglobin: 12.1 g/dL (ref 12.0–15.0)
MCH: 31.4 pg (ref 26.0–34.0)
MCHC: 32.5 g/dL (ref 30.0–36.0)
MCV: 96.6 fL (ref 78.0–100.0)
Platelets: 308 10*3/uL (ref 150–400)
RBC: 3.85 MIL/uL — AB (ref 3.87–5.11)
RDW: 12.7 % (ref 11.5–15.5)
WBC: 14.4 10*3/uL — ABNORMAL HIGH (ref 4.0–10.5)

## 2017-09-26 ENCOUNTER — Encounter (HOSPITAL_COMMUNITY): Payer: Self-pay | Admitting: *Deleted

## 2017-09-26 ENCOUNTER — Ambulatory Visit (HOSPITAL_COMMUNITY): Payer: Medicare Other | Admitting: Certified Registered"

## 2017-09-26 ENCOUNTER — Encounter (HOSPITAL_COMMUNITY)
Admission: RE | Disposition: A | Discharge status: Home / Self Care | Payer: Self-pay | Source: Ambulatory Visit | Attending: Internal Medicine

## 2017-09-26 ENCOUNTER — Ambulatory Visit (HOSPITAL_COMMUNITY)
Admission: RE | Admit: 2017-09-26 | Discharge: 2017-09-26 | Disposition: A | Discharge status: Home / Self Care | Payer: Medicare Other | Source: Ambulatory Visit | Attending: Internal Medicine | Admitting: Internal Medicine

## 2017-09-26 DIAGNOSIS — D649 Anemia, unspecified: Secondary | ICD-10-CM | POA: Insufficient documentation

## 2017-09-26 DIAGNOSIS — R933 Abnormal findings on diagnostic imaging of other parts of digestive tract: Secondary | ICD-10-CM | POA: Diagnosis not present

## 2017-09-26 DIAGNOSIS — F329 Major depressive disorder, single episode, unspecified: Secondary | ICD-10-CM | POA: Insufficient documentation

## 2017-09-26 DIAGNOSIS — E162 Hypoglycemia, unspecified: Secondary | ICD-10-CM | POA: Diagnosis not present

## 2017-09-26 DIAGNOSIS — Z7951 Long term (current) use of inhaled steroids: Secondary | ICD-10-CM | POA: Insufficient documentation

## 2017-09-26 DIAGNOSIS — J449 Chronic obstructive pulmonary disease, unspecified: Secondary | ICD-10-CM | POA: Insufficient documentation

## 2017-09-26 DIAGNOSIS — Z981 Arthrodesis status: Secondary | ICD-10-CM | POA: Insufficient documentation

## 2017-09-26 DIAGNOSIS — I472 Ventricular tachycardia: Secondary | ICD-10-CM | POA: Insufficient documentation

## 2017-09-26 DIAGNOSIS — Z881 Allergy status to other antibiotic agents status: Secondary | ICD-10-CM | POA: Insufficient documentation

## 2017-09-26 DIAGNOSIS — Z9981 Dependence on supplemental oxygen: Secondary | ICD-10-CM | POA: Diagnosis not present

## 2017-09-26 DIAGNOSIS — Z79899 Other long term (current) drug therapy: Secondary | ICD-10-CM | POA: Insufficient documentation

## 2017-09-26 DIAGNOSIS — Z86010 Personal history of colonic polyps: Secondary | ICD-10-CM | POA: Diagnosis not present

## 2017-09-26 DIAGNOSIS — G4730 Sleep apnea, unspecified: Secondary | ICD-10-CM | POA: Insufficient documentation

## 2017-09-26 DIAGNOSIS — F419 Anxiety disorder, unspecified: Secondary | ICD-10-CM | POA: Diagnosis not present

## 2017-09-26 DIAGNOSIS — Z91048 Other nonmedicinal substance allergy status: Secondary | ICD-10-CM | POA: Insufficient documentation

## 2017-09-26 DIAGNOSIS — K589 Irritable bowel syndrome without diarrhea: Secondary | ICD-10-CM | POA: Insufficient documentation

## 2017-09-26 DIAGNOSIS — K644 Residual hemorrhoidal skin tags: Secondary | ICD-10-CM | POA: Diagnosis not present

## 2017-09-26 DIAGNOSIS — Z888 Allergy status to other drugs, medicaments and biological substances status: Secondary | ICD-10-CM | POA: Insufficient documentation

## 2017-09-26 DIAGNOSIS — K921 Melena: Secondary | ICD-10-CM | POA: Diagnosis not present

## 2017-09-26 DIAGNOSIS — R1013 Epigastric pain: Secondary | ICD-10-CM | POA: Insufficient documentation

## 2017-09-26 DIAGNOSIS — I471 Supraventricular tachycardia: Secondary | ICD-10-CM | POA: Insufficient documentation

## 2017-09-26 DIAGNOSIS — K588 Other irritable bowel syndrome: Secondary | ICD-10-CM | POA: Diagnosis not present

## 2017-09-26 DIAGNOSIS — K56600 Partial intestinal obstruction, unspecified as to cause: Secondary | ICD-10-CM | POA: Diagnosis not present

## 2017-09-26 DIAGNOSIS — K5730 Diverticulosis of large intestine without perforation or abscess without bleeding: Secondary | ICD-10-CM | POA: Diagnosis not present

## 2017-09-26 DIAGNOSIS — Z880 Allergy status to penicillin: Secondary | ICD-10-CM | POA: Diagnosis not present

## 2017-09-26 DIAGNOSIS — Z87891 Personal history of nicotine dependence: Secondary | ICD-10-CM | POA: Insufficient documentation

## 2017-09-26 DIAGNOSIS — Z8619 Personal history of other infectious and parasitic diseases: Secondary | ICD-10-CM | POA: Diagnosis not present

## 2017-09-26 DIAGNOSIS — M2550 Pain in unspecified joint: Secondary | ICD-10-CM | POA: Insufficient documentation

## 2017-09-26 DIAGNOSIS — K5732 Diverticulitis of large intestine without perforation or abscess without bleeding: Secondary | ICD-10-CM | POA: Insufficient documentation

## 2017-09-26 DIAGNOSIS — Z833 Family history of diabetes mellitus: Secondary | ICD-10-CM | POA: Diagnosis not present

## 2017-09-26 DIAGNOSIS — Z8249 Family history of ischemic heart disease and other diseases of the circulatory system: Secondary | ICD-10-CM | POA: Insufficient documentation

## 2017-09-26 DIAGNOSIS — E785 Hyperlipidemia, unspecified: Secondary | ICD-10-CM | POA: Insufficient documentation

## 2017-09-26 DIAGNOSIS — Z825 Family history of asthma and other chronic lower respiratory diseases: Secondary | ICD-10-CM | POA: Insufficient documentation

## 2017-09-26 DIAGNOSIS — Z7982 Long term (current) use of aspirin: Secondary | ICD-10-CM | POA: Insufficient documentation

## 2017-09-26 DIAGNOSIS — R1032 Left lower quadrant pain: Secondary | ICD-10-CM | POA: Diagnosis not present

## 2017-09-26 DIAGNOSIS — K56699 Other intestinal obstruction unspecified as to partial versus complete obstruction: Secondary | ICD-10-CM | POA: Diagnosis not present

## 2017-09-26 HISTORY — PX: FLEXIBLE SIGMOIDOSCOPY: SHX5431

## 2017-09-26 SURGERY — SIGMOIDOSCOPY, FLEXIBLE
Anesthesia: Monitor Anesthesia Care

## 2017-09-26 MED ORDER — LACTATED RINGERS IV SOLN
INTRAVENOUS | Status: DC | PRN
  Administered 2017-09-26: 09:00:00 via INTRAVENOUS

## 2017-09-26 MED ORDER — HYDROCODONE-ACETAMINOPHEN 7.5-325 MG PO TABS: 1.0000 | ORAL_TABLET | Freq: Once | ORAL | Status: DC | PRN

## 2017-09-26 MED ORDER — PROPOFOL 10 MG/ML IV BOLUS
INTRAVENOUS | Status: AC
  Filled 2017-09-26: qty 40

## 2017-09-26 MED ORDER — PROPOFOL 500 MG/50ML IV EMUL
INTRAVENOUS | Status: DC | PRN
  Administered 2017-09-26: 75 ug/kg/min via INTRAVENOUS

## 2017-09-26 MED ORDER — PROMETHAZINE HCL 25 MG/ML IJ SOLN: 6.2500 mg | INTRAMUSCULAR | Status: DC | PRN

## 2017-09-26 MED ORDER — PROPOFOL 10 MG/ML IV BOLUS
INTRAVENOUS | Status: DC | PRN
  Administered 2017-09-26: 20 mg via INTRAVENOUS
  Administered 2017-09-26 (×2): 10 mg via INTRAVENOUS

## 2017-09-26 MED ORDER — STERILE WATER FOR IRRIGATION IR SOLN
Status: DC | PRN
  Administered 2017-09-26: 15 mL

## 2017-09-26 MED ORDER — LIDOCAINE HCL (CARDIAC) PF 100 MG/5ML IV SOSY
PREFILLED_SYRINGE | INTRAVENOUS | Status: DC | PRN
  Administered 2017-09-26: 40 mg via INTRATRACHEAL

## 2017-09-26 MED ORDER — HYDROMORPHONE HCL 1 MG/ML IJ SOLN
INTRAMUSCULAR | Status: AC
  Filled 2017-09-26: qty 0.5

## 2017-09-26 MED ORDER — LACTATED RINGERS IV SOLN
INTRAVENOUS | Status: DC
  Administered 2017-09-26: 09:00:00 via INTRAVENOUS

## 2017-09-26 MED ORDER — LACTATED RINGERS IV SOLN: INTRAVENOUS | Status: DC

## 2017-09-26 MED ORDER — PHENYLEPHRINE 40 MCG/ML (10ML) SYRINGE FOR IV PUSH (FOR BLOOD PRESSURE SUPPORT)
PREFILLED_SYRINGE | INTRAVENOUS | Status: AC
  Filled 2017-09-26: qty 10

## 2017-09-26 MED ORDER — MEPERIDINE HCL 100 MG/ML IJ SOLN: 6.2500 mg | INTRAMUSCULAR | Status: DC | PRN

## 2017-09-26 MED ORDER — SODIUM CHLORIDE 0.9 % IV SOLN: INTRAVENOUS | Status: DC

## 2017-09-26 MED ORDER — HYDROMORPHONE HCL 1 MG/ML IJ SOLN
0.2500 mg | INTRAMUSCULAR | Status: DC | PRN
  Administered 2017-09-26: 0.5 mg via INTRAVENOUS

## 2017-09-26 NOTE — Discharge Instructions (Signed)
Resume usual medications and diet. No driving for 24 hours. Notify if abdominal pain becomes worse. Office visit in 3 months.  Colonoscopy, Adult, Care After This sheet gives you information about how to care for yourself after your procedure. Your health care provider may also give you more specific instructions. If you have problems or questions, contact your health care provider. What can I expect after the procedure? After the procedure, it is common to have:  A small amount of blood in your stool for 24 hours after the procedure.  Some gas.  Mild abdominal cramping or bloating.  Follow these instructions at home: General instructions   For the first 24 hours after the procedure: ? Do not drive or use machinery. ? Do not sign important documents. ? Do not drink alcohol. ? Do your regular daily activities at a slower pace than normal. ? Eat soft, easy-to-digest foods. ? Rest often.  Take over-the-counter or prescription medicines only as told by your health care provider.  It is up to you to get the results of your procedure. Ask your health care provider, or the department performing the procedure, when your results will be ready. Relieving cramping and bloating  Try walking around when you have cramps or feel bloated.  Apply heat to your abdomen as told by your health care provider. Use a heat source that your health care provider recommends, such as a moist heat pack or a heating pad. ? Place a towel between your skin and the heat source. ? Leave the heat on for 20-30 minutes. ? Remove the heat if your skin turns bright red. This is especially important if you are unable to feel pain, heat, or cold. You may have a greater risk of getting burned. Eating and drinking  Drink enough fluid to keep your urine clear or pale yellow.  Resume your normal diet as instructed by your health care provider. Avoid heavy or fried foods that are hard to digest.  Avoid drinking  alcohol for as long as instructed by your health care provider. Contact a health care provider if:  You have blood in your stool 2-3 days after the procedure. Get help right away if:  You have more than a small spotting of blood in your stool.  You pass large blood clots in your stool.  Your abdomen is swollen.  You have nausea or vomiting.  You have a fever.  You have increasing abdominal pain that is not relieved with medicine. This information is not intended to replace advice given to you by your health care provider. Make sure you discuss any questions you have with your health care provider. Document Released: 12/05/2003 Document Revised: 01/15/2016 Document Reviewed: 07/04/2015 Elsevier Interactive Patient Education  2018 [employer].   Diverticulitis Diverticulitis is when small pockets in your large intestine (colon) get infected or swollen. This causes stomach pain and watery poop (diarrhea). These pouches are called diverticula. They form in people who have a condition called diverticulosis. Follow these instructions at home: Medicines  Take over-the-counter and prescription medicines only as told by your doctor. These include: ? Antibiotics. ? Pain medicines. ? Fiber pills. ? Probiotics. ? Stool softeners.  Do not drive or use heavy machinery while taking prescription pain medicine.  If you were prescribed an antibiotic, take it as told. Do not stop taking it even if you feel better. General instructions  Follow a diet as told by your doctor.  When you feel better, your doctor may tell you  to change your diet. You may need to eat a lot of fiber. Fiber makes it easier to poop (have bowel movements). Healthy foods with fiber include: ? Berries. ? Beans. ? Lentils. ? Green vegetables.  Exercise 3 or more times a week. Aim for 30 minutes each time. Exercise enough to sweat and make your heart beat faster.  Keep all follow-up visits as told. This is  important. You may need to have an exam of the large intestine. This is called a colonoscopy. Contact a doctor if:  Your pain does not get better.  You have a hard time eating or drinking.  You are not pooping like normal. Get help right away if:  Your pain gets worse.  Your problems do not get better.  Your problems get worse very fast.  You have a fever.  You throw up (vomit) more than one time.  You have poop that is: ? Bloody. ? Black. ? Tarry. Summary  Diverticulitis is when small pockets in your large intestine (colon) get infected or swollen.  Take medicines only as told by your doctor.  Follow a diet as told by your doctor. This information is not intended to replace advice given to you by your health care provider. Make sure you discuss any questions you have with your health care provider. Document Released: 10/09/2007 Document Revised: 05/09/2016 Document Reviewed: 05/09/2016 Elsevier Interactive Patient Education  2017 [employer].

## 2017-09-26 NOTE — Op Note (Addendum)
[facility] Patient Name: [name] Procedure Date: 09/26/2017 8:49 AM MRN: [id number] Date of Birth: June 05, 1955 Attending MD: Lionel December , MD CSN: [id number] Age: 62 Admit Type: Outpatient Procedure:                Flexible Sigmoidoscopy Indications:              Abdominal pain in the left lower quadrant,                            Hematochezia, Abnormal CT of the GI tract Providers:                Lionel December, MD, Buel Ream. Thomasena Edis RN, RN, Dyann Ruddle Referring MD:             Oneal Deputy. Juanetta Gosling MD, MD Medicines:                Propofol per Anesthesia Complications:            No immediate complications. Estimated Blood Loss:     Estimated blood loss: none. Procedure:                Pre-Anesthesia Assessment:                           - Prior to the procedure, a History and Physical                            was performed, and patient medications and                            allergies were reviewed. The patient's tolerance of                            previous anesthesia was also reviewed. The risks                            and benefits of the procedure and the sedation                            options and risks were discussed with the patient.                            All questions were answered, and informed consent                            was obtained. Prior Anticoagulants: The patient                            last took aspirin 3 days prior to the procedure.                            ASA Grade Assessment: IV - A patient with severe  systemic disease. After reviewing the risks and                            benefits, the patient was deemed in satisfactory                            condition to undergo the procedure.                           After obtaining informed consent, the scope was                            passed under direct vision. The [id number]                            ([id number])  scope was introduced through the anus and                            advanced to the the sigmoid colon. The flexible                            sigmoidoscopy was somewhat difficult due to                            unsatisfactory bowel prep. The quality of the bowel                            preparation was inadequate. The patient tolerated                            the procedure fairly well. Scope In: 9:20:05 AM Scope Out: 9:25:41 AM Total Procedure Duration: 0 hours 5 minutes 36 seconds  Findings:      The perianal exam findings include a skin tag.      The digital rectal exam was normal.      A benign-appearing, intrinsic mild stenosis was found in the sigmoid       colon. Scope not advanced because of stool.      A few medium-mouthed diverticula were found in the sigmoid colon and       distal sigmoid colon.      The rectum appeared normal.      External hemorrhoids were found during retroflexion. The hemorrhoids       were small. Impression:               - Preparation of the colon was inadequate.                           - Perianal skin tag found on perianal exam.                           - Stricture in the sigmoid colon.                           - Diverticulosis in the sigmoid colon and in the  distal sigmoid colon.                           - The rectum is normal.                           - External hemorrhoids.                           - No specimens collected. Moderate Sedation:         Recommendation:           - Discharge patient to home (with escort).                           - Continue present medications.                           - Resume aspirin at prior dose today.                           - Return to GI clinic in 3 months. Procedure Code(s):        --- Professional ---                           [phone], Sigmoidoscopy, flexible; diagnostic,                            including collection of specimen(s) by brushing or                             washing, when performed (separate procedure) Diagnosis Code(s):        --- Professional ---                           K64.4, Residual hemorrhoidal skin tags                           K56.699, Other intestinal obstruction unspecified                            as to partial versus complete obstruction                           R10.32, Left lower quadrant pain                           K92.1, Melena (includes Hematochezia)                           K57.30, Diverticulosis of large intestine without                            perforation or abscess without bleeding                           R93.3, Abnormal findings on diagnostic imaging of  other parts of digestive tract CPT copyright 2017 American [employer] Association. All rights reserved. The codes documented in this report are preliminary and upon coder review may  be revised to meet current compliance requirements. Lionel December, MD Lionel December, MD 09/26/2017 9:43:43 AM This report has been signed electronically. Number of Addenda: 0

## 2017-09-26 NOTE — Anesthesia Postprocedure Evaluation (Signed)
Anesthesia Post Note  Patient: [name]  Procedure(s) Performed: FLEXIBLE SIGMOIDOSCOPY with Propofol  (N/A )  Patient location during evaluation: PACU Anesthesia Type: MAC Level of consciousness: awake and alert and oriented Pain management: pain level controlled Vital Signs Assessment: post-procedure vital signs reviewed and stable Respiratory status: spontaneous breathing Cardiovascular status: stable Postop Assessment: no apparent nausea or vomiting Anesthetic complications: no     Last Vitals:  Vitals:   09/26/17 0945 09/26/17 0959  BP: (!) 125/93 119/71  Pulse: 95   Resp: 19   Temp:    SpO2: 100%     Last Pain:  Vitals:   09/26/17 0945  TempSrc:   PainSc: 3                  ADAMS, AMY A

## 2017-09-26 NOTE — Transfer of Care (Signed)
Immediate Anesthesia Transfer of Care Note  Patient: [name]  Procedure(s) Performed: FLEXIBLE SIGMOIDOSCOPY with Propofol  (N/A )  Patient Location: PACU  Anesthesia Type:MAC  Level of Consciousness: awake, alert , oriented and patient cooperative  Airway & Oxygen Therapy: Patient Spontanous Breathing and Patient connected to face mask oxygen  Post-op Assessment: Report given to RN and Post -op Vital signs reviewed and stable  Post vital signs: Reviewed and stable  Last Vitals:  Vitals Value Taken Time  BP    Temp    Pulse 94 09/26/2017  9:32 AM  Resp    SpO2 100 % 09/26/2017  9:32 AM  Vitals shown include unvalidated device data.  Last Pain:  Vitals:   09/26/17 0908  TempSrc:   PainSc: 4       Patients Stated Pain Goal: 4 (20/25/42 7062)  Complications: No apparent anesthesia complications

## 2017-09-26 NOTE — Anesthesia Preprocedure Evaluation (Signed)
Anesthesia Evaluation  Patient identified by MRN, date of birth, ID band Patient awake    Reviewed: Allergy & Precautions, H&P , NPO status , Patient's Chart, lab work & pertinent test results, reviewed documented beta blocker date and time   Airway Mallampati: II  TM Distance: >3 FB Neck ROM: full    Dental no notable dental hx. (+) Teeth Intact, Dental Advidsory Given   Pulmonary neg pulmonary ROS, shortness of breath, sleep apnea , pneumonia, resolved, COPD,  oxygen dependent, former smoker,    Pulmonary exam normal breath sounds clear to auscultation + decreased breath sounds      Cardiovascular Exercise Tolerance: Good negative cardio ROS  + dysrhythmias Supra Ventricular Tachycardia  Rhythm:regular Rate:Normal     Neuro/Psych Anxiety Depression negative neurological ROS  negative psych ROS   GI/Hepatic negative GI ROS, Neg liver ROS,   Endo/Other  negative endocrine ROS  Renal/GU negative Renal ROS  negative genitourinary   Musculoskeletal   Abdominal   Peds  Hematology negative hematology ROS (+) anemia ,   Anesthesia Other Findings H/o SVT controlled with diltiazem COPD, severe.  4L via n/c at home with SPO2 mid 90s. Ongoing tobacco abuse - smokes 2-5/day Held nebulizer at home today as "not needed" "Double Uvula"  Reproductive/Obstetrics negative OB ROS                             Anesthesia Physical Anesthesia Plan  ASA: IV  Anesthesia Plan: MAC   Post-op Pain Management:    Induction:   PONV Risk Score and Plan:   Airway Management Planned:   Additional Equipment:   Intra-op Plan:   Post-operative Plan:   Informed Consent: I have reviewed the patients History and Physical, chart, labs and discussed the procedure including the risks, benefits and alternatives for the proposed anesthesia with the patient or authorized representative who has indicated his/her  understanding and acceptance.   Dental Advisory Given  Plan Discussed with: CRNA and Anesthesiologist  Anesthesia Plan Comments:         Anesthesia Quick Evaluation

## 2017-09-26 NOTE — H&P (Signed)
[name] is an 62 y.o. female.   Chief Complaint: Patient is here for flexible sigmoidoscopy HPI: Patient is 62 year old Caucasian female with multiple medical problems including O2 dependent COPD who was treated for diverticulitis last year and again this year.  She underwent colonoscopy in January this year.  She remains with LLQ abdominal pain and blood-tinged mucus frequently if not daily.  She had follow-up CT in February 2019 and she still has marked wall thickening to sigmoid colon.  She is returning for flexible sigmoidoscopy to make sure she does not have colonic neoplasm in this area mimicking diverticulitis. She generally has 3-4 bowel movements per day.  All of her stools are formed.  She remains on dicyclomine 10 mg twice daily which has helped a great deal with the pain.  Now she has mild discomfort and no sharp pain.  She also complains of polyarthralgia since prednisone dose was decreased to 4 mg daily.  Past Medical History:  Diagnosis Date  . Anxiety   . Complication of anesthesia    hard to maintain oxygen levels after her lst surgery:   . COPD (chronic obstructive pulmonary disease) (HCC)    Home oxygen at [facility]  . Depression   . Diverticulosis   . Dyspnea   . Dysrhythmia   . History of cardiac catheterization    No significant CAD 2005  . Hyperlipidemia   . Hypoglycemia   . IBS (irritable bowel syndrome)   . Pneumonia 2015  . Shingles 2012  . Sleep apnea   . SVT (supraventricular tachycardia) (HCC)     Past Surgical History:  Procedure Laterality Date  . BACK SURGERY    . CARDIAC CATHETERIZATION  2004  . COLONOSCOPY  03/22/2011   Procedure: COLONOSCOPY;  Surgeon: Rogene Houston, MD;  Location: [facility];  Service: Endoscopy;  Laterality: N/A;  . COLONOSCOPY WITH PROPOFOL N/A 05/16/2017   Procedure: COLONOSCOPY WITH PROPOFOL;  Surgeon: Rogene Houston, MD;  Location: [facility];  Service: Endoscopy;  Laterality: N/A;  . POLYPECTOMY  05/16/2017    Procedure: POLYPECTOMY;  Surgeon: Rogene Houston, MD;  Location: [facility];  Service: Endoscopy;;  . SPINAL FUSION      Family History  Problem Relation Age of Onset  . Asthma Mother   . Heart attack Mother   . Diabetes Mother   . Heart failure Mother   . Colon cancer Neg Hx    Social History:  reports that she quit smoking about 2 years ago. Her smoking use included cigarettes. She has a 40.00 pack-year smoking history. She has never used smokeless tobacco. She reports that she drinks alcohol. She reports that she does not use drugs.  Allergies:  Allergies  Allergen Reactions  . Iohexol Anaphylaxis and Other (See Comments)     Desc: IV DYE  ANAPHYLATIC SHOCK X2 ONCE AFTER PREMEDS   . Neosporin [Neomycin-Bacitracin Zn-Polymyx] Itching  . Adhesive [Tape] Itching and Other (See Comments)    Break out  . Chantix [Varenicline] Other (See Comments)    PALPITATIONS  . Ketek [Telithromycin] Other (See Comments)    Loopy, eyes went different directions  . Penicillins Other (See Comments)    Unknown Has patient had a PCN reaction causing immediate rash, facial/tongue/throat swelling, SOB or lightheadedness with hypotension: Unknown Has patient had a PCN reaction causing severe rash involving mucus membranes or skin necrosis: Unknown Has patient had a PCN reaction that required hospitalization: Unknown Has patient had a PCN reaction occurring  within the last 10 years: No If all of the above answers are "NO", then may proceed with Cephalosporin use.    Medications Prior to Admission  Medication Sig Dispense Refill  . acyclovir (ZOVIRAX) 800 MG tablet Take 800 mg by mouth 5 (five) times daily as needed (for break outs).     Marland Kitchen albuterol (PROAIR HFA) 108 (90 BASE) MCG/ACT inhaler Inhale 2 puffs into the lungs every 6 (six) hours as needed for wheezing or shortness of breath.     . ALPRAZolam (XANAX) 0.25 MG tablet Take 0.25 mg by mouth as needed for anxiety.     Marland Kitchen aspirin 81 MG  tablet Take 1 tablet (81 mg total) by mouth daily. 100 tablet 3  . atorvastatin (LIPITOR) 40 MG tablet TAKE (1) TABLET BY MOUTH ONCE DAILY. (Patient taking differently: Take 40 mg by mouth at bedtime) 90 tablet 3  . azithromycin (ZITHROMAX) 250 MG tablet Take 250 mg by mouth as needed (preventative).    . carboxymethylcellulose (REFRESH) 1 % ophthalmic solution Place 2 drops into both eyes 3 (three) times daily as needed (for dry eyes).    . cetirizine (ZYRTEC) 10 MG tablet Take 10 mg by mouth daily.      . clonazePAM (KLONOPIN) 0.5 MG tablet Take 1 tablet (0.5 mg total) by mouth at bedtime as needed for anxiety. 10 tablet 0  . dicyclomine (BENTYL) 10 MG capsule Take 1 capsule (10 mg total) by mouth 3 (three) times daily as needed for spasms. 270 capsule 3  . diltiazem (DILACOR XR) 180 MG 24 hr capsule Take 180 mg by mouth daily.    Marland Kitchen escitalopram (LEXAPRO) 20 MG tablet Take 20 mg by mouth daily.      . Fluticasone-Salmeterol (ADVAIR) 250-50 MCG/DOSE AEPB Inhale 1 puff into the lungs 2 (two) times daily.     Marland Kitchen guaiFENesin (MUCINEX) 600 MG 12 hr tablet Take 600 mg by mouth 2 (two) times daily as needed for cough or to loosen phlegm.     Marland Kitchen ibuprofen (ADVIL,MOTRIN) 200 MG tablet Take 400 mg by mouth every 6 (six) hours as needed for moderate pain.    Marland Kitchen ipratropium-albuterol (DUONEB) 0.5-2.5 (3) MG/3ML SOLN Take 3 mLs by nebulization 3 (three) times daily. (Patient taking differently: Take 3 mLs by nebulization 3 (three) times daily as needed (for shortness of breath or wheezing). ) 360 mL 1  . montelukast (SINGULAIR) 10 MG tablet Take 1 tablet (10 mg total) by mouth at bedtime. 30 tablet 1  . Multiple Vitamin (MULTIVITAMIN WITH MINERALS) TABS tablet Take 1 tablet by mouth daily.    . Multiple Vitamins-Minerals (MULTIVITAMINS THER. W/MINERALS) TABS Take 1 tablet by mouth daily.      . ondansetron (ZOFRAN) 4 MG tablet Take 1 tablet (4 mg total) every 8 (eight) hours as needed by mouth for nausea or  vomiting. 30 tablet 0  . oxybutynin (DITROPAN) 5 MG tablet Take 5 mg by mouth daily.     . OXYGEN Inhale 4 L into the lungs continuous.    . predniSONE (DELTASONE) 1 MG tablet Take 4 mg by mouth daily.     . roflumilast (DALIRESP) 500 MCG TABS tablet Take 500 mcg by mouth daily.      . sodium chloride (OCEAN) 0.65 % SOLN nasal spray Place 2-3 sprays into both nostrils as needed for congestion.    Marland Kitchen SPIRIVA RESPIMAT 2.5 MCG/ACT AERS Inhale 2 puffs into the lungs daily.     . traMADol (ULTRAM) 50 MG  tablet Take 50 mg by mouth every 6 (six) hours as needed for moderate pain.     . traZODone (DESYREL) 50 MG tablet Take 50 mg by mouth at bedtime as needed for sleep.    Marland Kitchen triamcinolone (NASACORT ALLERGY 24HR) 55 MCG/ACT AERO nasal inhaler Place 2 sprays into the nose 2 (two) times daily as needed (for allergies).       No results found for this or any previous visit (from the past 48 hour(s)). No results found.  ROS  Blood pressure 136/71, pulse 86, temperature 98.4 F (36.9 C), temperature source Oral, resp. rate (!) 22, height _0  (1.6 m), weight 153 lb (69.4 kg), SpO2 98 %. Physical Exam  Constitutional: She appears well-developed and well-nourished.  HENT:  Mouth/Throat: Oropharynx is clear and moist.  Eyes: Conjunctivae are normal. No scleral icterus.  Neck: No thyromegaly present.  Cardiovascular: Normal rate, regular rhythm and normal heart sounds.  No murmur heard. Respiratory:  Diminished intensity of breath sounds bilaterally.  No rales or rhonchi.  GI:  Abdomen is symmetrical.  Small umbilical hernia noted.  It is reducible.  On palpation abdomen is soft.  She has mild tenderness in both iliac fossae.  No organomegaly or masses.     Assessment/Plan Persistent sigmoid wall thickening and hematochezia.  Diagnostic flexible sigmoidoscopy under monitored anesthesia care.   Hildred Laser, MD 09/26/2017, 9:04 AM

## 2017-09-27 IMAGING — CR DG CHEST 1V PORT
1 series · 1 of 1 positions shown · non-contrast
Comparison: 05/21/2016

CLINICAL DATA: Shortness of Breath

EXAM:
PORTABLE CHEST 1 VIEW

[portable]
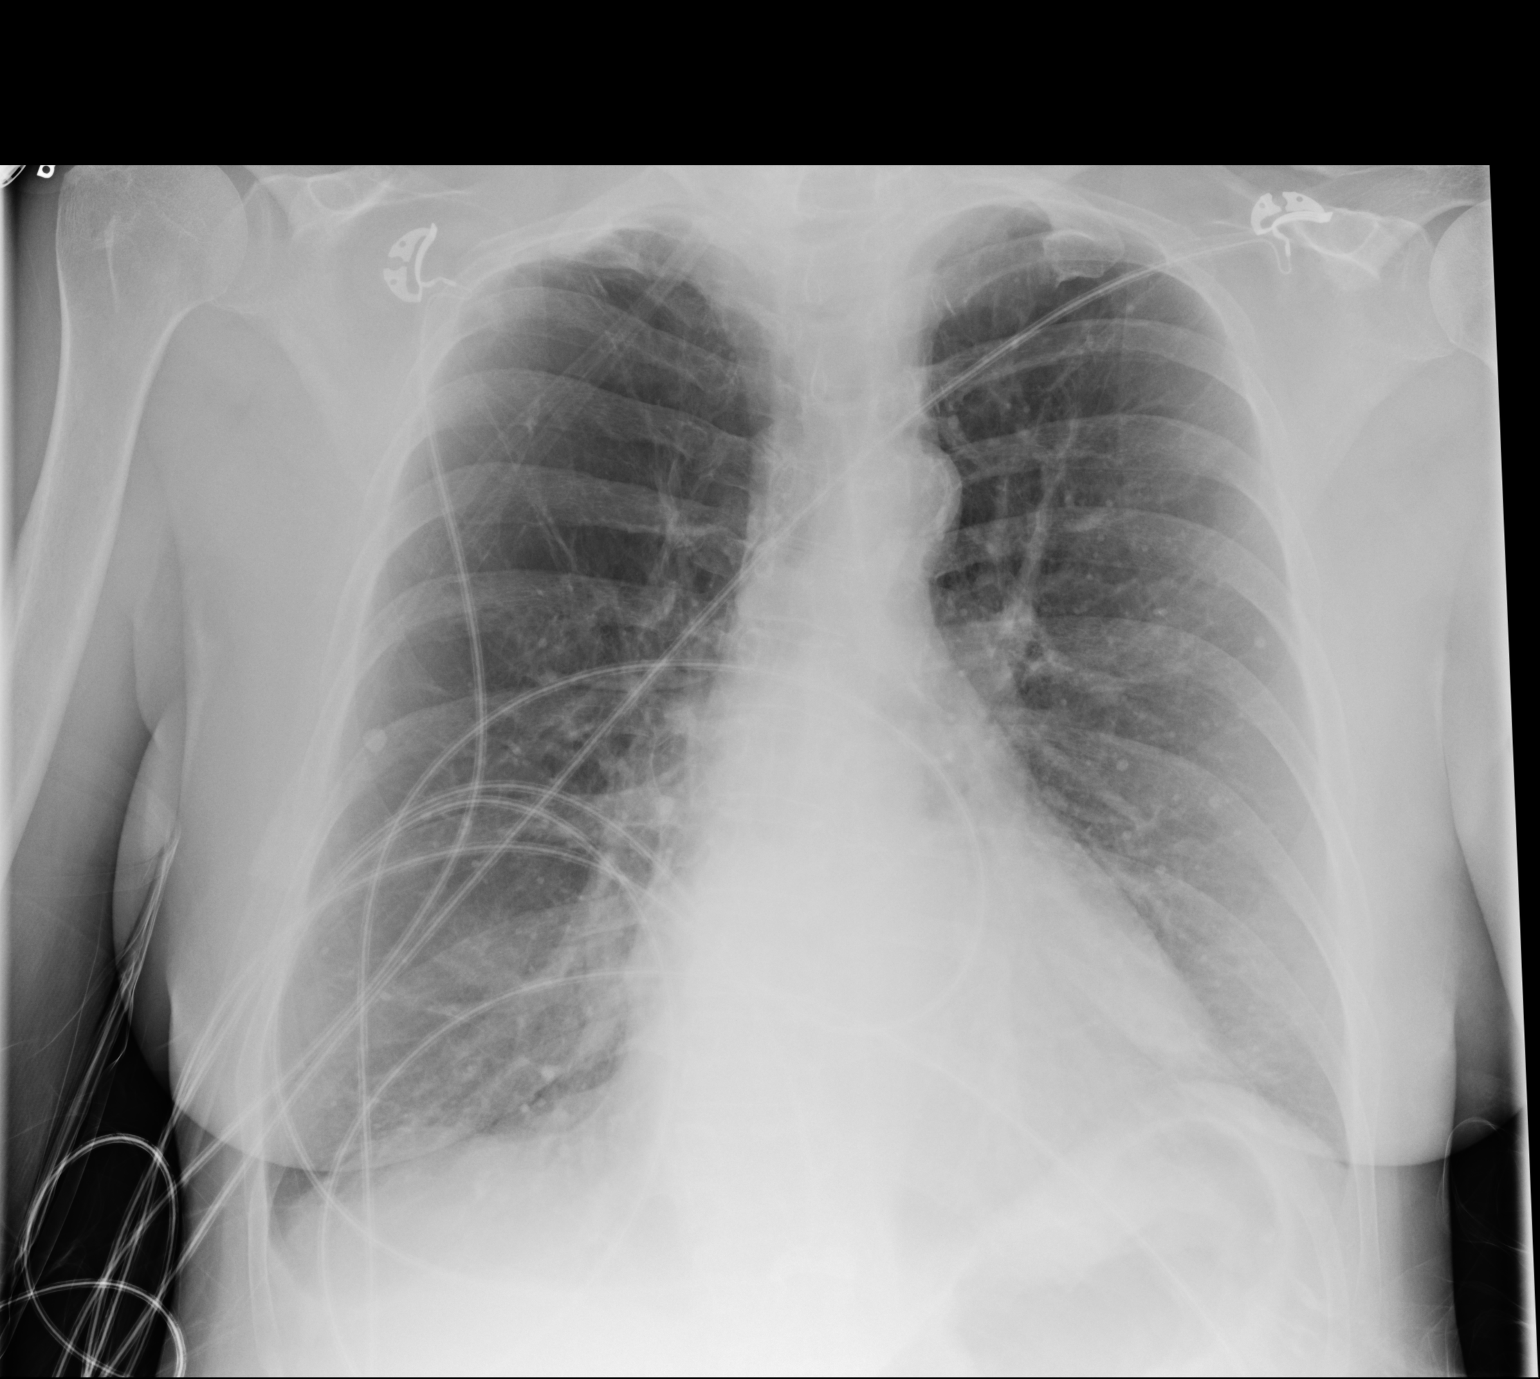

[1 of 1 positions shown; findings below may reference images not displayed]

FINDINGS: Cardiomediastinal silhouette is stable. Hyperinflation again noted.
Mild basilar atelectasis or scarring. No segmental infiltrate or
pulmonary edema. Stable calcified granuloma right midlung.
IMPRESSION: No active disease. Mild basilar atelectasis or scarring.
Hyperinflation again noted.

## 2017-09-30 ENCOUNTER — Telehealth (INDEPENDENT_AMBULATORY_CARE_PROVIDER_SITE_OTHER): Payer: Self-pay | Admitting: *Deleted

## 2017-09-30 ENCOUNTER — Encounter (INDEPENDENT_AMBULATORY_CARE_PROVIDER_SITE_OTHER): Payer: Self-pay | Admitting: *Deleted

## 2017-09-30 NOTE — Telephone Encounter (Signed)
Per op note, patient needs OV with Rehman in 3 months

## 2017-10-02 ENCOUNTER — Encounter (HOSPITAL_COMMUNITY): Payer: Self-pay | Admitting: Internal Medicine

## 2017-10-06 ENCOUNTER — Ambulatory Visit: Payer: Medicare Other | Admitting: Cardiology

## 2017-10-07 NOTE — Progress Notes (Signed)
Cardiology Office Note  Date: 10/09/2017   ID: [name], DOB Jul 26, 1955, MRN [id number]  PCP: Kari Baars, MD  Primary Cardiologist: Nona Dell, MD   Chief Complaint  Patient presents with  . Follow-up SVT    History of Present Illness: [name] is a 62 y.o. female last seen in November 2018.  She is here for a routine visit.  States that she is trying to wean down her dose of prednisone with Dr. Juanetta Gosling, now down to 3 mg daily but is starting to have more trouble with shortness of breath and air hunger.  She reports no change in her pulmonary regimen otherwise and continues to use supplemental oxygen.  She does state her heart rate has been up at times, particularly when she is short of breath.  No rapid palpitations faster than the 140s based on her measurements.  At the last visit Cardizem CD was increased to 240 mg daily.  She states that she has been compliant.  Past Medical History:  Diagnosis Date  . Anxiety   . COPD (chronic obstructive pulmonary disease) (HCC)    Home oxygen at [facility]  . Depression   . Diverticulosis   . Dyspnea   . History of cardiac catheterization    No significant CAD 2005  . Hyperlipidemia   . Hypoglycemia   . IBS (irritable bowel syndrome)   . Pneumonia 2015  . Shingles 2012  . Sleep apnea   . SVT (supraventricular tachycardia) (HCC)     Past Surgical History:  Procedure Laterality Date  . BACK SURGERY    . CARDIAC CATHETERIZATION  2004  . COLONOSCOPY  03/22/2011   Procedure: COLONOSCOPY;  Surgeon: Malissa Hippo, MD;  [facility];  Service: Endoscopy;  Laterality: N/A;  . COLONOSCOPY WITH PROPOFOL N/A 05/16/2017   Procedure: COLONOSCOPY WITH PROPOFOL;  Surgeon: Malissa Hippo, MD;  Location: [facility];  Service: Endoscopy;  Laterality: N/A;  . FLEXIBLE SIGMOIDOSCOPY N/A 09/26/2017   Procedure: FLEXIBLE SIGMOIDOSCOPY with Propofol ;  Surgeon: Malissa Hippo, MD;  [facility]: [facility];  Service:  Endoscopy;  Laterality: N/A;  9:30  . POLYPECTOMY  05/16/2017   Procedure: POLYPECTOMY;  Surgeon: Malissa Hippo, MD;  Location: [facility];  Service: Endoscopy;;  . SPINAL FUSION      Current Outpatient Medications  Medication Sig Dispense Refill  . acyclovir (ZOVIRAX) 800 MG tablet Take 800 mg by mouth 5 (five) times daily as needed (for break outs).     Marland Kitchen albuterol (PROAIR HFA) 108 (90 BASE) MCG/ACT inhaler Inhale 2 puffs into the lungs every 6 (six) hours as needed for wheezing or shortness of breath.     . ALPRAZolam (XANAX) 0.25 MG tablet Take 0.25 mg by mouth as needed for anxiety.     Marland Kitchen aspirin 81 MG tablet Take 1 tablet (81 mg total) by mouth daily. 100 tablet 3  . atorvastatin (LIPITOR) 40 MG tablet TAKE (1) TABLET BY MOUTH ONCE DAILY. (Patient taking differently: Take 40 mg by mouth at bedtime) 90 tablet 3  . carboxymethylcellulose (REFRESH) 1 % ophthalmic solution Place 2 drops into both eyes 3 (three) times daily as needed (for dry eyes).    . cetirizine (ZYRTEC) 10 MG tablet Take 10 mg by mouth daily.      . clonazePAM (KLONOPIN) 0.5 MG tablet Take 1 tablet (0.5 mg total) by mouth at bedtime as needed for anxiety. 10 tablet 0  . dicyclomine (BENTYL)  10 MG capsule Take 1 capsule (10 mg total) by mouth 3 (three) times daily as needed for spasms. (Patient taking differently: Take 10 mg by mouth 2 (two) times daily. ) 270 capsule 3  . escitalopram (LEXAPRO) 20 MG tablet Take 20 mg by mouth daily.      . Fluticasone-Salmeterol (ADVAIR) 250-50 MCG/DOSE AEPB Inhale 1 puff into the lungs 2 (two) times daily.     Marland Kitchen guaiFENesin (MUCINEX) 600 MG 12 hr tablet Take 600 mg by mouth 2 (two) times daily as needed for cough or to loosen phlegm.     Marland Kitchen ibuprofen (ADVIL,MOTRIN) 200 MG tablet Take 400 mg by mouth every 6 (six) hours as needed for moderate pain.    Marland Kitchen ipratropium-albuterol (DUONEB) 0.5-2.5 (3) MG/3ML SOLN Take 3 mLs by nebulization 3 (three) times daily. (Patient taking differently:  Take 3 mLs by nebulization 3 (three) times daily as needed (for shortness of breath or wheezing). ) 360 mL 1  . montelukast (SINGULAIR) 10 MG tablet Take 1 tablet (10 mg total) by mouth at bedtime. 30 tablet 1  . Multiple Vitamin (MULTIVITAMIN WITH MINERALS) TABS tablet Take 1 tablet by mouth daily.    . Multiple Vitamins-Minerals (MULTIVITAMINS THER. W/MINERALS) TABS Take 1 tablet by mouth daily.      . ondansetron (ZOFRAN) 4 MG tablet Take 1 tablet (4 mg total) every 8 (eight) hours as needed by mouth for nausea or vomiting. 30 tablet 0  . oxybutynin (DITROPAN) 5 MG tablet Take 5 mg by mouth daily.     . OXYGEN Inhale 4 L into the lungs continuous.    . predniSONE (DELTASONE) 1 MG tablet Take 3 mg by mouth daily.     . roflumilast (DALIRESP) 500 MCG TABS tablet Take 500 mcg by mouth daily.      . sodium chloride (OCEAN) 0.65 % SOLN nasal spray Place 2-3 sprays into both nostrils as needed for congestion.    Marland Kitchen SPIRIVA RESPIMAT 2.5 MCG/ACT AERS Inhale 2 puffs into the lungs daily.     . traMADol (ULTRAM) 50 MG tablet Take 50 mg by mouth every 6 (six) hours as needed for moderate pain.     . traZODone (DESYREL) 50 MG tablet Take 50 mg by mouth at bedtime as needed for sleep.    Marland Kitchen triamcinolone (NASACORT ALLERGY 24HR) 55 MCG/ACT AERO nasal inhaler Place 2 sprays into the nose 2 (two) times daily as needed (for allergies).     Marland Kitchen diltiazem (CARDIZEM CD) 240 MG 24 hr capsule Take 1 capsule (240 mg total) by mouth daily. 90 capsule 3  . diltiazem (CARDIZEM) 30 MG tablet Take 1-2 tablets every 6 hours as needed for persistent tachycardai 90 tablet 3   No current facility-administered medications for this visit.    Allergies:  Iohexol; Neosporin [neomycin-bacitracin zn-polymyx]; Adhesive [tape]; Chantix [varenicline]; Ketek [telithromycin]; and Penicillins   Social History: The patient  reports that she quit smoking about 2 years ago. Her smoking use included cigarettes. She has a 40.00 pack-year  smoking history. She has never used smokeless tobacco. She reports that she drinks alcohol. She reports that she does not use drugs.   ROS:  Please see the history of present illness. Otherwise, complete review of systems is positive for none.  All other systems are reviewed and negative.   Physical Exam: VS:  BP 132/70 (BP Location: Right Arm)   Pulse 93   Ht 5\' 3"  (1.6 m)   Wt 139 lb (63 kg)  SpO2 97%   BMI 24.62 kg/m , BMI Body mass index is 24.62 kg/m.  Wt Readings from Last 3 Encounters:  10/09/17 139 lb (63 kg)  09/26/17 153 lb (69.4 kg)  09/22/17 153 lb (69.4 kg)    General: Patient appears comfortable at rest.  Seated in wheelchair and wearing oxygen via nasal cannula. HEENT: Conjunctiva and lids normal, oropharynx clear. Neck: Supple, no elevated JVP or carotid bruits, no thyromegaly. Lungs: Decreased breath sounds throughout without active wheezing, nonlabored breathing at rest. Cardiac: Regular rate and rhythm, no S3 or significant systolic murmur, no pericardial rub. Abdomen: Soft, nontender, bowel sounds present. Extremities: No pitting edema, distal pulses 2+. Skin: Warm and dry. Musculoskeletal: No kyphosis. Neuropsychiatric: Alert and oriented x3, affect grossly appropriate.  ECG: I personally reviewed the tracing from 05/13/2017 which showed sinus tachycardia with borderline low voltage.  Recent Labwork: 09/22/2017: BUN 15; Creatinine, Ser 0.85; Hemoglobin 12.1; Platelets 308; Potassium 3.9; Sodium 137   Other Studies Reviewed Today:  Echocardiogram2/20/2017: Study Conclusions  - Left ventricle: The cavity size was normal. Wall thickness was normal. Systolic function was normal. The estimated ejection fraction was in the range of 60% to 65%. Wall motion was normal; there were no regional wall motion abnormalities. Doppler parameters are consistent with abnormal left ventricular relaxation (grade 1 diastolic dysfunction). - Left atrium: The  atrium was mildly dilated.  Assessment and Plan:  1.  Palpitations and history of SVT.  As noted previously her resting heart rate tends to be fast in the setting of chronic lung disease and hypoxic respiratory failure.  She is tolerating Cardizem CD 240 mg daily.  Also providing a prescription for short acting 30 mg tablets, 1-2 every 6 hours as needed prolonged palpitations.  2.  Severe oxygen dependent COPD.  She is in the process of slowly weaning prednisone and continues to follow with Dr. Juanetta Gosling.  3.  Mixed hyperlipidemia, continues on Lipitor with follow-up per Dr. Juanetta Gosling.  Current medicines were reviewed with the patient today.  Disposition: Follow-up in 6 months.  Signed, Jonelle Sidle, MD, [facility] 10/09/2017 1:23 PM    [facility] at [facility] [address], [place], [place] [address] Phone: [phone]; Fax: [phone]

## 2017-10-09 ENCOUNTER — Encounter: Payer: Self-pay | Admitting: Cardiology

## 2017-10-09 ENCOUNTER — Ambulatory Visit (INDEPENDENT_AMBULATORY_CARE_PROVIDER_SITE_OTHER): Payer: Medicare Other | Admitting: Cardiology

## 2017-10-09 VITALS — BP 132/70 | HR 93 | Ht 63.0 in | Wt 139.0 lb

## 2017-10-09 DIAGNOSIS — I471 Supraventricular tachycardia: Secondary | ICD-10-CM

## 2017-10-09 DIAGNOSIS — J449 Chronic obstructive pulmonary disease, unspecified: Secondary | ICD-10-CM | POA: Diagnosis not present

## 2017-10-09 DIAGNOSIS — E782 Mixed hyperlipidemia: Secondary | ICD-10-CM | POA: Diagnosis not present

## 2017-10-09 DIAGNOSIS — R002 Palpitations: Secondary | ICD-10-CM

## 2017-10-09 MED ORDER — DILTIAZEM HCL 30 MG PO TABS: ORAL_TABLET | ORAL | 3 refills | Status: DC

## 2017-10-09 MED ORDER — DILTIAZEM HCL ER COATED BEADS 240 MG PO CP24: 240.0000 mg | ORAL_CAPSULE | Freq: Every day | ORAL | 3 refills | Status: DC

## 2017-10-09 NOTE — Patient Instructions (Signed)
Your physician wants you to follow-up in: 6 months with Dr.McDowell You will receive a reminder letter in the mail two months in advance. If you don't receive a letter, please call our office to schedule the follow-up appointment.     Take Cardizem 30 mg,  1-2 tablets every 6 hours as needed for persistent fast heart rate    All other medications stay the same     No lab work or tests ordered today.     Thank you for choosing [facility] !

## 2017-11-21 DIAGNOSIS — I10 Essential (primary) hypertension: Secondary | ICD-10-CM | POA: Diagnosis not present

## 2017-11-21 DIAGNOSIS — N390 Urinary tract infection, site not specified: Secondary | ICD-10-CM | POA: Diagnosis not present

## 2017-11-21 DIAGNOSIS — J9611 Chronic respiratory failure with hypoxia: Secondary | ICD-10-CM | POA: Diagnosis not present

## 2017-11-21 DIAGNOSIS — F419 Anxiety disorder, unspecified: Secondary | ICD-10-CM | POA: Diagnosis not present

## 2017-11-21 DIAGNOSIS — J449 Chronic obstructive pulmonary disease, unspecified: Secondary | ICD-10-CM | POA: Diagnosis not present

## 2017-12-24 IMAGING — CT CT NECK W/O CM
3 of 4 series · 13 of 33 positions shown, 16 images · non-contrast
Comparison: 10/19/2010 cervical MRI

CLINICAL DATA: 61 y/o F; fever and bilateral neck swelling
right-greater-than-left.

EXAM:
CT NECK WITHOUT CONTRAST
TECHNIQUE: Multidetector CT imaging of the neck was performed following the
standard protocol without intravenous contrast.

[Series 2: axial neck · axial · 0.45mm/px · z∈[-74,+68]mm · 5 of 107 slices shown, 7 images]
[im 18/107  soft-tissue]
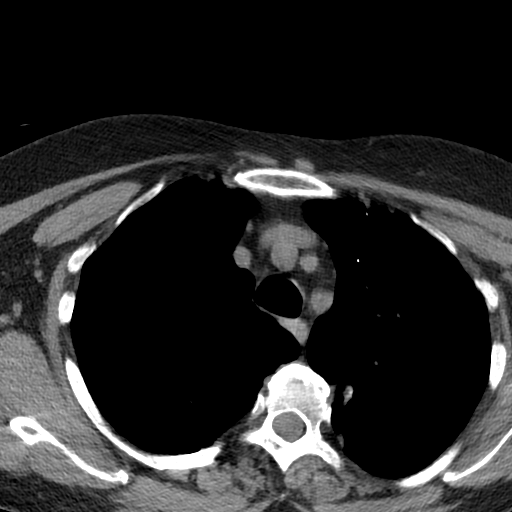
[im 18/107  bone]
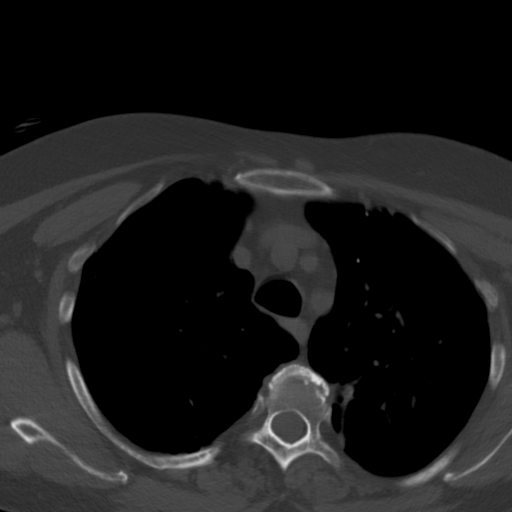
[im 36/107  bone]
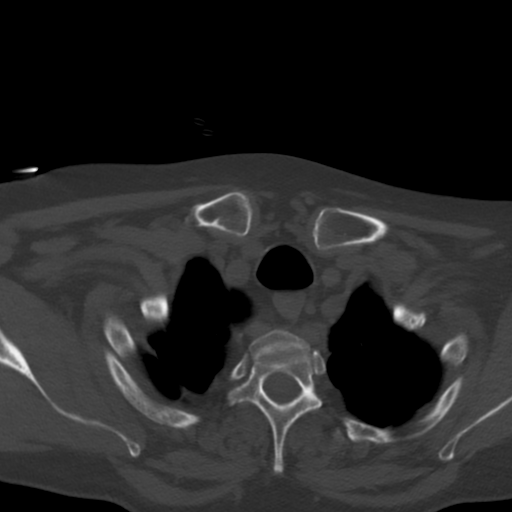
[im 54/107  bone]
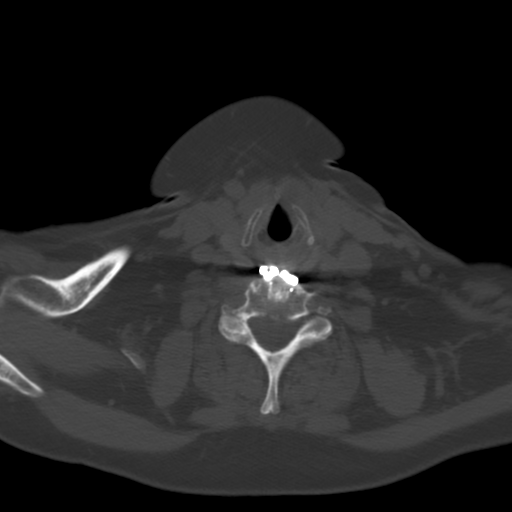
[im 71/107  bone]
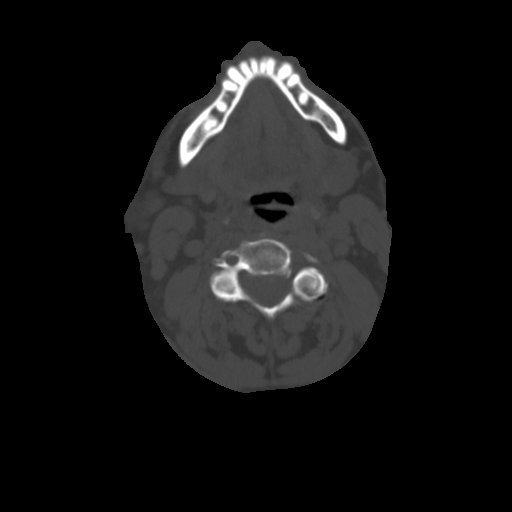
[im 89/107  soft-tissue]
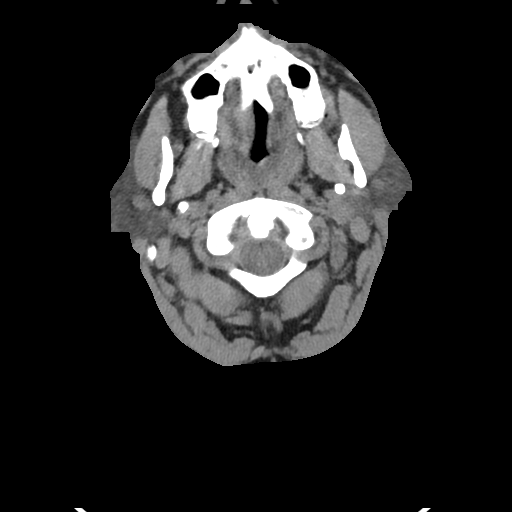
[im 89/107  bone]
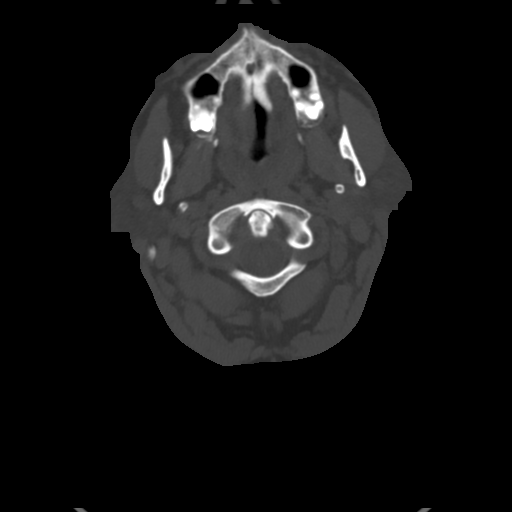

[Series 6: coronal neck · coronal · 0.30mm/px · 3 of 87 slices shown]
[im 21/87  bone]
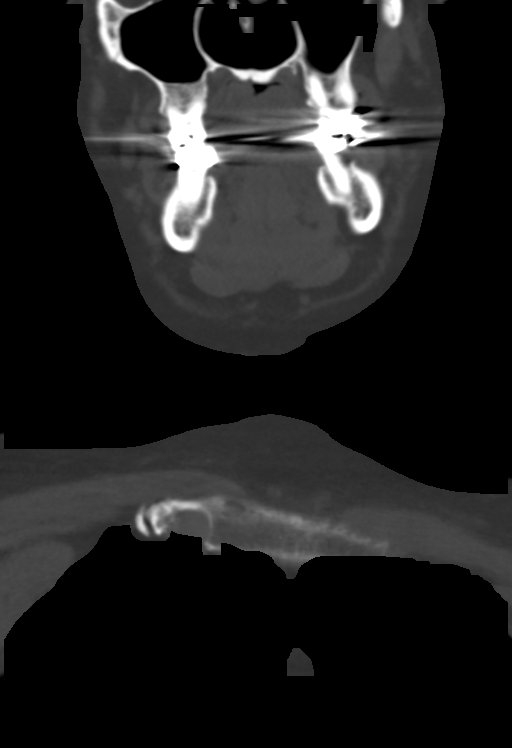
[im 36/87  bone]
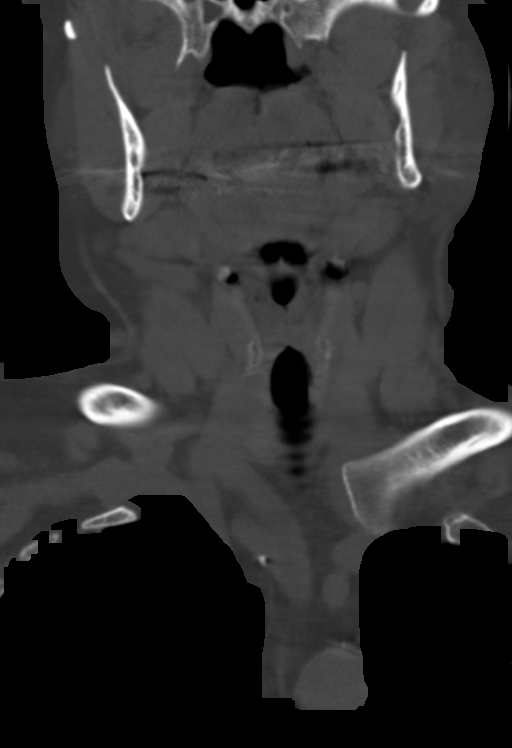
[im 51/87  bone]
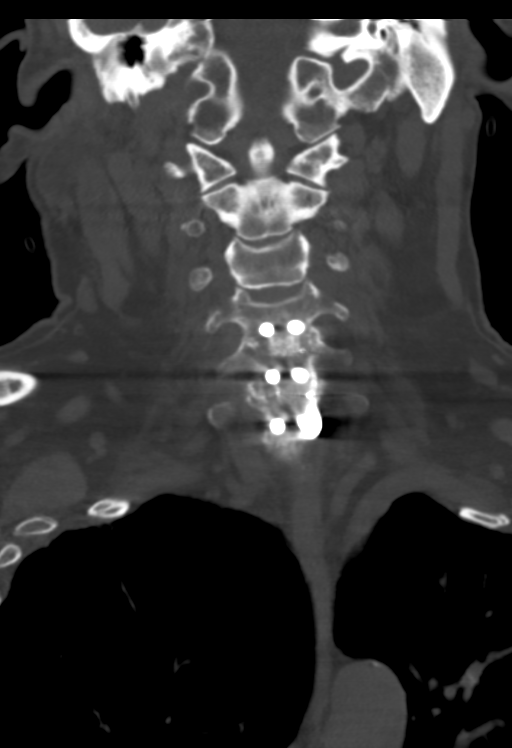

[Series 7: sagittal neck · sagittal · 0.42mm/px · 5 of 85 slices shown, 6 images]
[im 29/85  bone]
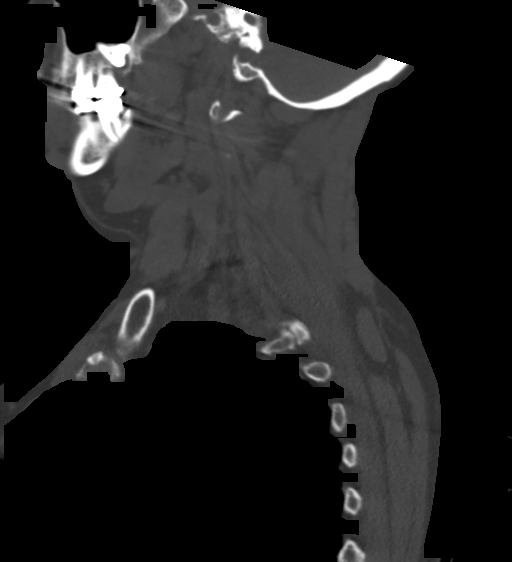
[im 36/85  bone]
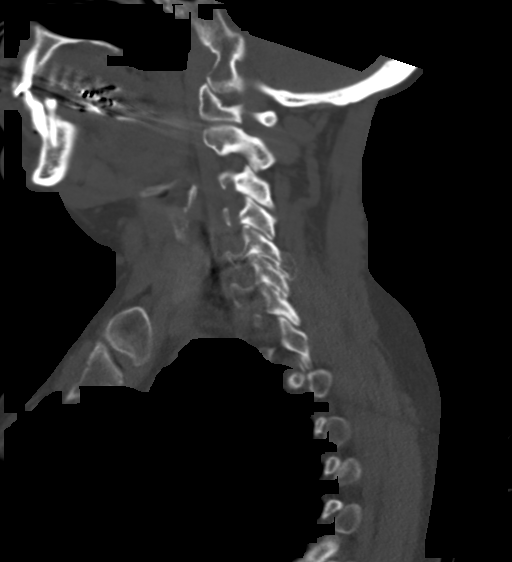
[im 43/85  soft-tissue]
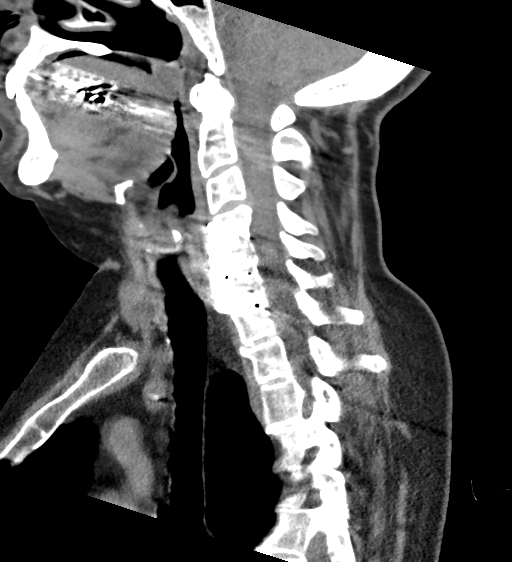
[im 43/85  bone]
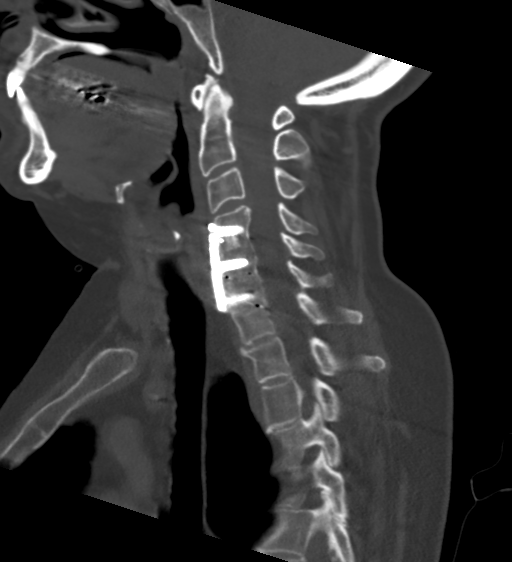
[im 50/85  bone]
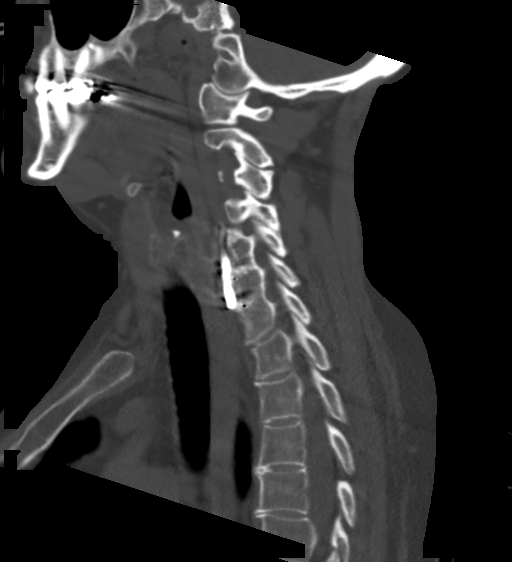
[im 57/85  bone]
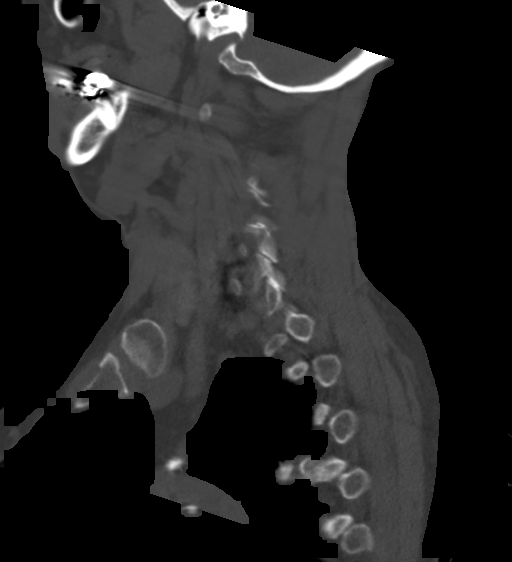

[13 of 33 positions shown; findings below may reference images not displayed]

FINDINGS: Pharynx and larynx: Bilateral internal laryngocele. No mass or
swelling.

Salivary glands: No inflammation, mass, or stone.

Thyroid: Normal.

Lymph nodes: None enlarged or abnormal density.

Vascular: Negative.

Limited intracranial: Negative.

Visualized orbits: Negative.

Mastoids and visualized paranasal sinuses: Clear.

Skeleton: Anterior cervical and fusion of c4 through C6. Upper
cervical facet arthrosis. C3-4 disc bulge with anterior cord contact
and mild-to-moderate canal stenosis.

Upper chest: Severe emphysema of the lung apices. Calcified scarring
in the left lung apex and scattered calcified granulomas. Moderate
calcific atherosclerosis of the aortic arch.

Other: Negative.
IMPRESSION: No mass or cervical lymphadenopathy identified. No acute
inflammatory process. Chronic findings as above.

By: Klpigbb Moolman M.D.

## 2018-01-06 ENCOUNTER — Ambulatory Visit (INDEPENDENT_AMBULATORY_CARE_PROVIDER_SITE_OTHER): Payer: Medicare Other | Admitting: Internal Medicine

## 2018-01-06 ENCOUNTER — Other Ambulatory Visit (INDEPENDENT_AMBULATORY_CARE_PROVIDER_SITE_OTHER): Payer: Self-pay | Admitting: *Deleted

## 2018-01-06 ENCOUNTER — Encounter (INDEPENDENT_AMBULATORY_CARE_PROVIDER_SITE_OTHER): Payer: Self-pay | Admitting: Internal Medicine

## 2018-01-06 VITALS — BP 130/80 | HR 88 | Temp 98.3°F | Resp 18 | Ht 63.0 in | Wt 140.4 lb

## 2018-01-06 DIAGNOSIS — N390 Urinary tract infection, site not specified: Secondary | ICD-10-CM

## 2018-01-06 DIAGNOSIS — K588 Other irritable bowel syndrome: Secondary | ICD-10-CM | POA: Diagnosis not present

## 2018-01-06 DIAGNOSIS — K5732 Diverticulitis of large intestine without perforation or abscess without bleeding: Secondary | ICD-10-CM | POA: Diagnosis not present

## 2018-01-06 DIAGNOSIS — Z8719 Personal history of other diseases of the digestive system: Secondary | ICD-10-CM | POA: Diagnosis not present

## 2018-01-06 MED ORDER — SULFAMETHOXAZOLE-TRIMETHOPRIM 400-80 MG PO TABS: 1.0000 | ORAL_TABLET | Freq: Two times a day (BID) | ORAL | 0 refills | Status: DC

## 2018-01-06 NOTE — Progress Notes (Signed)
Presenting complaint;  Follow-up for abdominal pain diarrhea and history of diverticulitis. Patient complains of cloudy urine.  Database and subjective:  Patient is 62 year old Caucasian female with O2 dependent COPD who has history of diverticulitis and has been treated on multiple occasions.  She had colonoscopy in January this year.  She was treated with antibiotics again and return for flexible sigmoidoscopy in May 2019 but sigmoid colon was not thoroughly examined because of poor prep.  She is also felt to have IBS.  She is here for scheduled visit accompanied by her sister.  She continues to complain of abdominal pain which is mainly in the lower abdomen but at times she has generalized soreness.  She continues to pass blood per rectum.  She says the frequency has decreased and now she sees blood once or twice a week.  It always occurs with a bowel movement and small in amount and bright red in color.  She is having an average of 3 stools per day.  Most of her stools are semi-formed.  She had an episode of nonbloody diarrhea 2 days ago.  She states on Sunday she had a close to 10 bowel movements.  She did not have fever.  She had nausea but no vomiting.  She she took Imodium and diarrhea resolved.  She also complains of cloudy urine.  She denies pneumaturia. she feels her urinary tract infection is back.  She was treated with Cipro about 6 weeks ago.  She has a prescription for Bactrim but she has not taken it.  She takes ibuprofen occasionally.  She did take a dose Sunday.  Her appetite is good.  Her weight has been stable.    Current Medications: Outpatient Encounter Medications as of 01/06/2018  Medication Sig  . acyclovir (ZOVIRAX) 800 MG tablet Take 800 mg by mouth 5 (five) times daily as needed (for break outs).   Marland Kitchen albuterol (PROAIR HFA) 108 (90 BASE) MCG/ACT inhaler Inhale 2 puffs into the lungs every 6 (six) hours as needed for wheezing or shortness of breath.   . ALPRAZolam (XANAX)  0.25 MG tablet Take 0.25 mg by mouth as needed for anxiety.   Marland Kitchen aspirin 81 MG tablet Take 1 tablet (81 mg total) by mouth daily.  Marland Kitchen atorvastatin (LIPITOR) 40 MG tablet TAKE (1) TABLET BY MOUTH ONCE DAILY. (Patient taking differently: Take 40 mg by mouth at bedtime)  . carboxymethylcellulose (REFRESH) 1 % ophthalmic solution Place 2 drops into both eyes 3 (three) times daily as needed (for dry eyes).  . cetirizine (ZYRTEC) 10 MG tablet Take 10 mg by mouth daily.    . clonazePAM (KLONOPIN) 0.5 MG tablet Take 1 tablet (0.5 mg total) by mouth at bedtime as needed for anxiety.  . dicyclomine (BENTYL) 10 MG capsule Take 1 capsule (10 mg total) by mouth 3 (three) times daily as needed for spasms. (Patient taking differently: Take 10 mg by mouth 2 (two) times daily. )  . diltiazem (CARDIZEM CD) 240 MG 24 hr capsule Take 1 capsule (240 mg total) by mouth daily.  Marland Kitchen diltiazem (CARDIZEM) 30 MG tablet Take 1-2 tablets every 6 hours as needed for persistent tachycardai  . escitalopram (LEXAPRO) 20 MG tablet Take 20 mg by mouth daily.    . Fluticasone-Salmeterol (ADVAIR) 250-50 MCG/DOSE AEPB Inhale 1 puff into the lungs 2 (two) times daily.   Marland Kitchen guaiFENesin (MUCINEX) 600 MG 12 hr tablet Take 600 mg by mouth 2 (two) times daily as needed for cough or to loosen  phlegm.   Marland Kitchen ibuprofen (ADVIL,MOTRIN) 200 MG tablet Take 400 mg by mouth every 6 (six) hours as needed for moderate pain.  Marland Kitchen ipratropium-albuterol (DUONEB) 0.5-2.5 (3) MG/3ML SOLN Take 3 mLs by nebulization 3 (three) times daily. (Patient taking differently: Take 3 mLs by nebulization 3 (three) times daily as needed (for shortness of breath or wheezing). )  . montelukast (SINGULAIR) 10 MG tablet Take 1 tablet (10 mg total) by mouth at bedtime.  . Multiple Vitamin (MULTIVITAMIN WITH MINERALS) TABS tablet Take 1 tablet by mouth daily.  . ondansetron (ZOFRAN) 4 MG tablet Take 1 tablet (4 mg total) every 8 (eight) hours as needed by mouth for nausea or vomiting.  Marland Kitchen  oxybutynin (DITROPAN) 5 MG tablet Take 5 mg by mouth daily.   . OXYGEN Inhale 4 L into the lungs continuous.  . predniSONE (DELTASONE) 1 MG tablet Take 3 mg by mouth daily.   . roflumilast (DALIRESP) 500 MCG TABS tablet Take 500 mcg by mouth daily.    . sodium chloride (OCEAN) 0.65 % SOLN nasal spray Place 2-3 sprays into both nostrils as needed for congestion.  Marland Kitchen SPIRIVA RESPIMAT 2.5 MCG/ACT AERS Inhale 2 puffs into the lungs daily.   . traMADol (ULTRAM) 50 MG tablet Take 50 mg by mouth every 6 (six) hours as needed for moderate pain.   . traZODone (DESYREL) 50 MG tablet Take 50 mg by mouth at bedtime as needed for sleep.  Marland Kitchen triamcinolone (NASACORT ALLERGY 24HR) 55 MCG/ACT AERO nasal inhaler Place 2 sprays into the nose 2 (two) times daily as needed (for allergies).   . [DISCONTINUED] Multiple Vitamins-Minerals (MULTIVITAMINS THER. W/MINERALS) TABS Take 1 tablet by mouth daily.     No facility-administered encounter medications on file as of 01/06/2018.      Objective: Blood pressure 130/80, pulse 88, temperature 98.3 F (36.8 C), temperature source Oral, resp. rate 18, height 5\' 3"  (1.6 m), weight 140 lb 6.4 oz (63.7 kg). Patient is alert and in no acute distress. She is on a nasal O2. Conjunctiva is pink. Sclera is nonicteric Oropharyngeal mucosa is normal. She has double uvula. No neck masses or thyromegaly noted. Cardiac exam with regular rhythm normal S1 and S2. No murmur or gallop noted. Auscultation of lungs reveal diminished intensity of breath sounds bilaterally. Abdomen is symmetrical.  Bowel sounds are normal.  She has mild generalized tenderness without guarding or rebound.  Tenderness somewhat more pronounced LLQ. No LE edema or clubbing noted.   Assessment:  #1.  Chronic abdominal pain which is predominantly in the lower abdomen on the left side.  She has been treated for diverticulitis on multiple occasions since last year.  She has very abnormal sigmoid colon segment on  multiple CTs dating back to 2014.  In my opinion she has chronic diverticulitis and would be best treated with sigmoid colon resection but she is deemed to be high risk on account of lung disease.  Some of her pain may be due to IBS.  She remains with hematochezia possibly due to hemorrhoids but could be related to her chronic diverticulitis.  #2.  Irritable bowel syndrome.  She is on low-dose dicyclomine with decrease in urgency and frequency of stools and she is having less pain.  #3.  Urinary tract infection.  She is not toxic or febrile.  She does not have pneumaturia.  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   Plan:  Urine analysis and urine culture. Patient will begin Bactrim DS 1 tablet  p.o. twice daily as soon as urine sample provided to the lab. Abdominopelvic CT with contrast.

## 2018-01-06 NOTE — Patient Instructions (Signed)
Abdominopelvic CT to be scheduled. Begin antibiotic as soon as urine sample provided to the lab.

## 2018-01-08 LAB — URINALYSIS, ROUTINE W REFLEX MICROSCOPIC
Bilirubin Urine: NEGATIVE
Glucose, UA: NEGATIVE
HYALINE CAST: NONE SEEN /LPF
Hgb urine dipstick: NEGATIVE
Ketones, ur: NEGATIVE
Leukocytes, UA: NEGATIVE
NITRITE: POSITIVE — AB
Protein, ur: NEGATIVE
RBC / HPF: NONE SEEN /HPF (ref 0–2)
SPECIFIC GRAVITY, URINE: 1.009 (ref 1.001–1.03)
pH: 6 (ref 5.0–8.0)

## 2018-01-08 LAB — URINE CULTURE
MICRO NUMBER: 91052587
SPECIMEN QUALITY: ADEQUATE

## 2018-01-12 ENCOUNTER — Other Ambulatory Visit (INDEPENDENT_AMBULATORY_CARE_PROVIDER_SITE_OTHER): Payer: Self-pay | Admitting: *Deleted

## 2018-01-12 DIAGNOSIS — N390 Urinary tract infection, site not specified: Secondary | ICD-10-CM

## 2018-01-14 ENCOUNTER — Other Ambulatory Visit: Payer: Self-pay | Admitting: Cardiology

## 2018-01-16 ENCOUNTER — Ambulatory Visit (HOSPITAL_COMMUNITY)
Admission: RE | Admit: 2018-01-16 | Discharge: 2018-01-16 | Disposition: A | Discharge status: Home / Self Care | Payer: Medicare Other | Source: Ambulatory Visit | Attending: Internal Medicine | Admitting: Internal Medicine

## 2018-01-16 DIAGNOSIS — K5732 Diverticulitis of large intestine without perforation or abscess without bleeding: Secondary | ICD-10-CM | POA: Diagnosis present

## 2018-01-16 DIAGNOSIS — K8020 Calculus of gallbladder without cholecystitis without obstruction: Secondary | ICD-10-CM | POA: Diagnosis not present

## 2018-01-16 DIAGNOSIS — K5730 Diverticulosis of large intestine without perforation or abscess without bleeding: Secondary | ICD-10-CM | POA: Diagnosis not present

## 2018-01-16 DIAGNOSIS — N390 Urinary tract infection, site not specified: Secondary | ICD-10-CM | POA: Diagnosis not present

## 2018-01-18 LAB — URINALYSIS, ROUTINE W REFLEX MICROSCOPIC
BACTERIA UA: NONE SEEN /HPF
Bilirubin Urine: NEGATIVE
Glucose, UA: NEGATIVE
HGB URINE DIPSTICK: NEGATIVE
Hyaline Cast: NONE SEEN /LPF
KETONES UR: NEGATIVE
Nitrite: NEGATIVE
Protein, ur: NEGATIVE
Specific Gravity, Urine: 1.018 (ref 1.001–1.03)
pH: 6 (ref 5.0–8.0)

## 2018-01-18 LAB — URINE CULTURE
MICRO NUMBER:: 91105239
Result:: NO GROWTH
SPECIMEN QUALITY:: ADEQUATE

## 2018-02-16 ENCOUNTER — Other Ambulatory Visit (HOSPITAL_COMMUNITY): Payer: Self-pay | Admitting: Pulmonary Disease

## 2018-02-16 DIAGNOSIS — J9611 Chronic respiratory failure with hypoxia: Secondary | ICD-10-CM | POA: Diagnosis not present

## 2018-02-16 DIAGNOSIS — I10 Essential (primary) hypertension: Secondary | ICD-10-CM | POA: Diagnosis not present

## 2018-02-16 DIAGNOSIS — Z1231 Encounter for screening mammogram for malignant neoplasm of breast: Secondary | ICD-10-CM

## 2018-02-16 DIAGNOSIS — R739 Hyperglycemia, unspecified: Secondary | ICD-10-CM | POA: Diagnosis not present

## 2018-02-16 DIAGNOSIS — F419 Anxiety disorder, unspecified: Secondary | ICD-10-CM | POA: Diagnosis not present

## 2018-02-16 DIAGNOSIS — J449 Chronic obstructive pulmonary disease, unspecified: Secondary | ICD-10-CM | POA: Diagnosis not present

## 2018-02-17 ENCOUNTER — Ambulatory Visit (INDEPENDENT_AMBULATORY_CARE_PROVIDER_SITE_OTHER): Payer: Medicare Other | Admitting: Internal Medicine

## 2018-02-20 DIAGNOSIS — K589 Irritable bowel syndrome without diarrhea: Secondary | ICD-10-CM | POA: Diagnosis not present

## 2018-02-20 DIAGNOSIS — Z23 Encounter for immunization: Secondary | ICD-10-CM | POA: Diagnosis not present

## 2018-02-20 DIAGNOSIS — I10 Essential (primary) hypertension: Secondary | ICD-10-CM | POA: Diagnosis not present

## 2018-02-20 DIAGNOSIS — J449 Chronic obstructive pulmonary disease, unspecified: Secondary | ICD-10-CM | POA: Diagnosis not present

## 2018-02-20 DIAGNOSIS — J9611 Chronic respiratory failure with hypoxia: Secondary | ICD-10-CM | POA: Diagnosis not present

## 2018-02-26 ENCOUNTER — Ambulatory Visit (HOSPITAL_COMMUNITY): Payer: Medicare Other

## 2018-03-12 ENCOUNTER — Ambulatory Visit (HOSPITAL_COMMUNITY): Payer: Medicare Other

## 2018-03-12 ENCOUNTER — Encounter (HOSPITAL_COMMUNITY): Payer: Self-pay

## 2018-03-13 ENCOUNTER — Other Ambulatory Visit: Payer: Self-pay | Admitting: Cardiology

## 2018-03-13 MED ORDER — ATORVASTATIN CALCIUM 40 MG PO TABS: ORAL_TABLET | ORAL | 3 refills | Status: DC

## 2018-03-13 NOTE — Telephone Encounter (Signed)
Needing refill for atorvastatin (LIPITOR) 40 MG tablet [[id number]]  Sent to [place].

## 2018-03-13 NOTE — Telephone Encounter (Signed)
Refill sent.

## 2018-03-19 DIAGNOSIS — N390 Urinary tract infection, site not specified: Secondary | ICD-10-CM | POA: Diagnosis not present

## 2018-04-13 ENCOUNTER — Ambulatory Visit: Payer: Medicare Other | Admitting: Cardiology

## 2018-04-13 NOTE — Progress Notes (Deleted)
Cardiology Office Note  Date: 04/13/2018   ID: [name], DOB 1955-10-15, MRN [id number]  PCP: Kari Baars, MD  Primary Cardiologist: Nona Dell, MD   No chief complaint on file.   History of Present Illness: [name] is a 62 y.o. female last seen in June.  Past Medical History:  Diagnosis Date  . Anxiety   . COPD (chronic obstructive pulmonary disease) (HCC)    Home oxygen at [facility]  . Depression   . Diverticulosis   . Dyspnea   . History of cardiac catheterization    No significant CAD 2005  . Hyperlipidemia   . Hypoglycemia   . IBS (irritable bowel syndrome)   . Pneumonia 2015  . Shingles 2012  . Sleep apnea   . SVT (supraventricular tachycardia) (HCC)     Past Surgical History:  Procedure Laterality Date  . BACK SURGERY    . CARDIAC CATHETERIZATION  2004  . COLONOSCOPY  03/22/2011   Procedure: COLONOSCOPY;  Surgeon: Malissa Hippo, MD;  [facility];  Service: Endoscopy;  Laterality: N/A;  . COLONOSCOPY WITH PROPOFOL N/A 05/16/2017   Procedure: COLONOSCOPY WITH PROPOFOL;  Surgeon: Malissa Hippo, MD;  [facility];  Service: Endoscopy;  Laterality: N/A;  . FLEXIBLE SIGMOIDOSCOPY N/A 09/26/2017   Procedure: FLEXIBLE SIGMOIDOSCOPY with Propofol ;  Surgeon: Malissa Hippo, MD;  [facility];  Service: Endoscopy;  Laterality: N/A;  9:30  . POLYPECTOMY  05/16/2017   Procedure: POLYPECTOMY;  Surgeon: Malissa Hippo, MD;  [facility];  Service: Endoscopy;;  . SPINAL FUSION      Current Outpatient Medications  Medication Sig Dispense Refill  . acyclovir (ZOVIRAX) 800 MG tablet Take 800 mg by mouth 5 (five) times daily as needed (for break outs).     Marland Kitchen albuterol (PROAIR HFA) 108 (90 BASE) MCG/ACT inhaler Inhale 2 puffs into the lungs every 6 (six) hours as needed for wheezing or shortness of breath.     . ALPRAZolam (XANAX) 0.25 MG tablet Take 0.25 mg by mouth as needed for anxiety.     Marland Kitchen aspirin 81  MG tablet Take 1 tablet (81 mg total) by mouth daily. 100 tablet 3  . atorvastatin (LIPITOR) 40 MG tablet Take 40 mg by mouth at bedtime 90 tablet 3  . carboxymethylcellulose (REFRESH) 1 % ophthalmic solution Place 2 drops into both eyes 3 (three) times daily as needed (for dry eyes).    . cetirizine (ZYRTEC) 10 MG tablet Take 10 mg by mouth daily.      . clonazePAM (KLONOPIN) 0.5 MG tablet Take 1 tablet (0.5 mg total) by mouth at bedtime as needed for anxiety. 10 tablet 0  . dicyclomine (BENTYL) 10 MG capsule Take 1 capsule (10 mg total) by mouth 3 (three) times daily as needed for spasms. (Patient taking differently: Take 10 mg by mouth 2 (two) times daily. ) 270 capsule 3  . diltiazem (CARDIZEM CD) 240 MG 24 hr capsule TAKE 1 CAPSULE BY MOUTH DAILY 90 capsule 3  . diltiazem (CARDIZEM) 30 MG tablet Take 1-2 tablets every 6 hours as needed for persistent tachycardai 90 tablet 3  . escitalopram (LEXAPRO) 20 MG tablet Take 20 mg by mouth daily.      . Fluticasone-Salmeterol (ADVAIR) 250-50 MCG/DOSE AEPB Inhale 1 puff into the lungs 2 (two) times daily.     Marland Kitchen guaiFENesin (MUCINEX) 600 MG 12 hr tablet Take 600 mg by mouth 2 (two) times  daily as needed for cough or to loosen phlegm.     Marland Kitchen ibuprofen (ADVIL,MOTRIN) 200 MG tablet Take 400 mg by mouth every 6 (six) hours as needed for moderate pain.    Marland Kitchen ipratropium-albuterol (DUONEB) 0.5-2.5 (3) MG/3ML SOLN Take 3 mLs by nebulization 3 (three) times daily. (Patient taking differently: Take 3 mLs by nebulization 3 (three) times daily as needed (for shortness of breath or wheezing). ) 360 mL 1  . montelukast (SINGULAIR) 10 MG tablet Take 1 tablet (10 mg total) by mouth at bedtime. 30 tablet 1  . Multiple Vitamin (MULTIVITAMIN WITH MINERALS) TABS tablet Take 1 tablet by mouth daily.    . ondansetron (ZOFRAN) 4 MG tablet Take 1 tablet (4 mg total) every 8 (eight) hours as needed by mouth for nausea or vomiting. 30 tablet 0  . oxybutynin (DITROPAN) 5 MG tablet  Take 5 mg by mouth daily.     . OXYGEN Inhale 4 L into the lungs continuous.    . predniSONE (DELTASONE) 1 MG tablet Take 3 mg by mouth daily.     . roflumilast (DALIRESP) 500 MCG TABS tablet Take 500 mcg by mouth daily.      . sodium chloride (OCEAN) 0.65 % SOLN nasal spray Place 2-3 sprays into both nostrils as needed for congestion.    Marland Kitchen SPIRIVA RESPIMAT 2.5 MCG/ACT AERS Inhale 2 puffs into the lungs daily.     Marland Kitchen sulfamethoxazole-trimethoprim (BACTRIM,SEPTRA) 400-80 MG tablet Take 1 tablet by mouth 2 (two) times daily. 14 tablet 0  . traMADol (ULTRAM) 50 MG tablet Take 50 mg by mouth every 6 (six) hours as needed for moderate pain.     . traZODone (DESYREL) 50 MG tablet Take 50 mg by mouth at bedtime as needed for sleep.    Marland Kitchen triamcinolone (NASACORT ALLERGY 24HR) 55 MCG/ACT AERO nasal inhaler Place 2 sprays into the nose 2 (two) times daily as needed (for allergies).      No current facility-administered medications for this visit.    Allergies:  Iohexol; Neosporin [neomycin-bacitracin zn-polymyx]; Adhesive [tape]; Chantix [varenicline]; Ketek [telithromycin]; and Penicillins   Social History: The patient  reports that she quit smoking about 2 years ago. Her smoking use included cigarettes. She has a 40.00 pack-year smoking history. She has never used smokeless tobacco. She reports that she drinks alcohol. She reports that she does not use drugs.   Family History: The patient's family history includes Asthma in her mother; Diabetes in her mother; Heart attack in her mother; Heart failure in her mother.   ROS:  Please see the history of present illness. Otherwise, complete review of systems is positive for {NONE DEFAULTED:18576::"none"}.  All other systems are reviewed and negative.   Physical Exam: VS:  There were no vitals taken for this visit., BMI There is no height or weight on file to calculate BMI.  Wt Readings from Last 3 Encounters:  01/06/18 140 lb 6.4 oz (63.7 kg)  10/09/17 139  lb (63 kg)  09/26/17 153 lb (69.4 kg)    General: Patient appears comfortable at rest. HEENT: Conjunctiva and lids normal, oropharynx clear with moist mucosa. Neck: Supple, no elevated JVP or carotid bruits, no thyromegaly. Lungs: Clear to auscultation, nonlabored breathing at rest. Cardiac: Regular rate and rhythm, no S3 or significant systolic murmur, no pericardial rub. Abdomen: Soft, nontender, no hepatomegaly, bowel sounds present, no guarding or rebound. Extremities: No pitting edema, distal pulses 2+. Skin: Warm and dry. Musculoskeletal: No kyphosis. Neuropsychiatric: Alert and oriented x3,  affect grossly appropriate.  ECG: I personally reviewed the tracing from 05/13/2017 which showed sinus tachycardia with borderline low voltage.  Recent Labwork: 09/22/2017: BUN 15; Creatinine, Ser 0.85; Hemoglobin 12.1; Platelets 308; Potassium 3.9; Sodium 137   Other Studies Reviewed Today:  Echocardiogram2/20/2017: Study Conclusions  - Left ventricle: The cavity size was normal. Wall thickness was normal. Systolic function was normal. The estimated ejection fraction was in the range of 60% to 65%. Wall motion was normal; there were no regional wall motion abnormalities. Doppler parameters are consistent with abnormal left ventricular relaxation (grade 1 diastolic dysfunction). - Left atrium: The atrium was mildly dilated.  Assessment and Plan:   Current medicines were reviewed with the patient today.  No orders of the defined types were placed in this encounter.   Disposition:  Signed, Jonelle Sidle, MD, [facility] 04/13/2018 8:34 AM    [facility] at [facility] [address] [place] [address], [place], [place] [address] Phone: [phone]; Fax: [phone]

## 2018-05-27 ENCOUNTER — Ambulatory Visit: Payer: Medicare Other | Admitting: Cardiology

## 2018-06-29 NOTE — Progress Notes (Signed)
Cardiology Office Note  Date: 06/30/2018   ID: [name], DOB 1956/02/07, MRN [id number]  PCP: Kari Baars, MD  Primary Cardiologist: Nona Dell, MD   Chief Complaint  Patient presents with  . PSVT    History of Present Illness: [name] is a 63 y.o. female last seen in June 2019.  She is here today for a routine follow-up visit.  From a cardiac perspective, she has had no palpitations and has not required any short acting Cardizem.  She remains on Cardizem CD 240 mg daily and continues to follow with Dr. Juanetta Gosling.  I personally reviewed her ECG today which shows normal sinus rhythm with nonspecific ST-T changes.  Past Medical History:  Diagnosis Date  . Anxiety   . COPD (chronic obstructive pulmonary disease) (HCC)    Home oxygen at [facility]  . Depression   . Diverticulosis   . Dyspnea   . History of cardiac catheterization    No significant CAD 2005  . Hyperlipidemia   . Hypoglycemia   . IBS (irritable bowel syndrome)   . Pneumonia 2015  . Shingles 2012  . Sleep apnea   . SVT (supraventricular tachycardia) (HCC)     Past Surgical History:  Procedure Laterality Date  . BACK SURGERY    . CARDIAC CATHETERIZATION  2004  . COLONOSCOPY  03/22/2011   Procedure: COLONOSCOPY;  Surgeon: Malissa Hippo, MD;  [facility];  Service: Endoscopy;  Laterality: N/A;  . COLONOSCOPY WITH PROPOFOL N/A 05/16/2017   Procedure: COLONOSCOPY WITH PROPOFOL;  Surgeon: Malissa Hippo, MD;  [facility];  Service: Endoscopy;  Laterality: N/A;  . FLEXIBLE SIGMOIDOSCOPY N/A 09/26/2017   Procedure: FLEXIBLE SIGMOIDOSCOPY with Propofol ;  Surgeon: Malissa Hippo, MD;  [facility];  Service: Endoscopy;  Laterality: N/A;  9:30  . POLYPECTOMY  05/16/2017   Procedure: POLYPECTOMY;  Surgeon: Malissa Hippo, MD;  [facility];  Service: Endoscopy;;  . SPINAL FUSION      Current Outpatient Medications  Medication Sig Dispense Refill    . acyclovir (ZOVIRAX) 800 MG tablet Take 800 mg by mouth 5 (five) times daily as needed (for break outs).     Marland Kitchen albuterol (PROAIR HFA) 108 (90 BASE) MCG/ACT inhaler Inhale 2 puffs into the lungs every 6 (six) hours as needed for wheezing or shortness of breath.     . ALPRAZolam (XANAX) 0.25 MG tablet Take 0.25 mg by mouth as needed for anxiety.     Marland Kitchen aspirin 81 MG tablet Take 1 tablet (81 mg total) by mouth daily. 100 tablet 3  . atorvastatin (LIPITOR) 40 MG tablet Take 40 mg by mouth at bedtime 90 tablet 3  . carboxymethylcellulose (REFRESH) 1 % ophthalmic solution Place 2 drops into both eyes 3 (three) times daily as needed (for dry eyes).    . cetirizine (ZYRTEC) 10 MG tablet Take 10 mg by mouth daily.      . clonazePAM (KLONOPIN) 0.5 MG tablet Take 1 tablet (0.5 mg total) by mouth at bedtime as needed for anxiety. 10 tablet 0  . dicyclomine (BENTYL) 10 MG capsule Take 1 capsule (10 mg total) by mouth 3 (three) times daily as needed for spasms. (Patient taking differently: Take 10 mg by mouth 2 (two) times daily. ) 270 capsule 3  . diltiazem (CARDIZEM CD) 240 MG 24 hr capsule TAKE 1 CAPSULE BY MOUTH DAILY 90 capsule 3  . diltiazem (CARDIZEM) 30 MG tablet Take  1-2 tablets every 6 hours as needed for persistent tachycardai 90 tablet 3  . escitalopram (LEXAPRO) 20 MG tablet Take 20 mg by mouth daily.      . Fluticasone-Salmeterol (ADVAIR) 250-50 MCG/DOSE AEPB Inhale 1 puff into the lungs 2 (two) times daily.     Marland Kitchen guaiFENesin (MUCINEX) 600 MG 12 hr tablet Take 600 mg by mouth 2 (two) times daily as needed for cough or to loosen phlegm.     Marland Kitchen ibuprofen (ADVIL,MOTRIN) 200 MG tablet Take 400 mg by mouth every 6 (six) hours as needed for moderate pain.    Marland Kitchen ipratropium-albuterol (DUONEB) 0.5-2.5 (3) MG/3ML SOLN Take 3 mLs by nebulization 3 (three) times daily. (Patient taking differently: Take 3 mLs by nebulization 3 (three) times daily as needed (for shortness of breath or wheezing). ) 360 mL 1  .  montelukast (SINGULAIR) 10 MG tablet Take 1 tablet (10 mg total) by mouth at bedtime. 30 tablet 1  . Multiple Vitamin (MULTIVITAMIN WITH MINERALS) TABS tablet Take 1 tablet by mouth daily.    . ondansetron (ZOFRAN) 4 MG tablet Take 1 tablet (4 mg total) every 8 (eight) hours as needed by mouth for nausea or vomiting. 30 tablet 0  . oxybutynin (DITROPAN) 5 MG tablet Take 5 mg by mouth daily.     . OXYGEN Inhale 4 L into the lungs continuous.    . roflumilast (DALIRESP) 500 MCG TABS tablet Take 500 mcg by mouth daily.      . sodium chloride (OCEAN) 0.65 % SOLN nasal spray Place 2-3 sprays into both nostrils as needed for congestion.    Marland Kitchen SPIRIVA RESPIMAT 2.5 MCG/ACT AERS Inhale 2 puffs into the lungs daily.     . traMADol (ULTRAM) 50 MG tablet Take 50 mg by mouth every 6 (six) hours as needed for moderate pain.     . traZODone (DESYREL) 50 MG tablet Take 50 mg by mouth at bedtime as needed for sleep.    Marland Kitchen triamcinolone (NASACORT ALLERGY 24HR) 55 MCG/ACT AERO nasal inhaler Place 2 sprays into the nose 2 (two) times daily as needed (for allergies).      No current facility-administered medications for this visit.    Allergies:  Iohexol; Neosporin [neomycin-bacitracin zn-polymyx]; Adhesive [tape]; Chantix [varenicline]; Ketek [telithromycin]; and Penicillins   Social History: The patient  reports that she quit smoking about 3 years ago. Her smoking use included cigarettes. She has a 40.00 pack-year smoking history. She has never used smokeless tobacco. She reports current alcohol use. She reports that she does not use drugs.   ROS:  Please see the history of present illness. Otherwise, complete review of systems is positive for chronic dyspnea, recent diverticulitis.  All other systems are reviewed and negative.   Physical Exam: VS:  BP 138/72   Pulse (!) 104   Ht 5' (1.524 m)   Wt 143 lb (64.9 kg)   SpO2 98% Comment: 4 L [place] O2  BMI 27.93 kg/m , BMI Body mass index is 27.93 kg/m.  Wt  Readings from Last 3 Encounters:  06/30/18 143 lb (64.9 kg)  01/06/18 140 lb 6.4 oz (63.7 kg)  10/09/17 139 lb (63 kg)    General: Patient appears comfortable at rest.  Wearing oxygen via nasal cannula. HEENT: Conjunctiva and lids normal, oropharynx clear. Neck: Supple, no elevated JVP or carotid bruits, no thyromegaly. Lungs: Diminished breath sounds throughout, nonlabored breathing at rest. Cardiac: Regular rate and rhythm, no S3 or significant systolic murmur. Abdomen: Soft, nontender,  bowel sounds present. Extremities: No pitting edema, distal pulses 2+. Skin: Warm and dry. Musculoskeletal: No kyphosis. Neuropsychiatric: Alert and oriented x3, affect grossly appropriate.  ECG: I personally reviewed the tracing from 05/13/2017 which showed sinus tachycardia with low voltage.  Recent Labwork: 09/22/2017: BUN 15; Creatinine, Ser 0.85; Hemoglobin 12.1; Platelets 308; Potassium 3.9; Sodium 137   Other Studies Reviewed Today:  Echocardiogram2/20/2017: Study Conclusions  - Left ventricle: The cavity size was normal. Wall thickness was normal. Systolic function was normal. The estimated ejection fraction was in the range of 60% to 65%. Wall motion was normal; there were no regional wall motion abnormalities. Doppler parameters are consistent with abnormal left ventricular relaxation (grade 1 diastolic dysfunction). - Left atrium: The atrium was mildly dilated.  Assessment and Plan:  1.  History of PSVT.  She has done well on Cardizem CD 240 mg daily and also has short acting Cardizem to take for breakthrough palpitations which she has not experienced since last encounter.  2.  Severe oxygen dependent COPD.  Continues to follow closely with Dr. Juanetta Gosling.  She has weaned herself off prednisone.  Current medicines were reviewed with the patient today.   Orders Placed This Encounter  Procedures  . EKG 12-Lead    Disposition: Follow-up in 6 months.  Signed, Jonelle Sidle, MD, [facility] 06/30/2018 1:47 PM    [facility] at [facility] [address], [place], [place] [address] Phone: [phone]; Fax: [phone]

## 2018-06-30 ENCOUNTER — Ambulatory Visit (INDEPENDENT_AMBULATORY_CARE_PROVIDER_SITE_OTHER): Payer: Medicare Other | Admitting: Cardiology

## 2018-06-30 ENCOUNTER — Encounter: Payer: Self-pay | Admitting: Cardiology

## 2018-06-30 VITALS — BP 138/72 | HR 104 | Ht 60.0 in | Wt 143.0 lb

## 2018-06-30 DIAGNOSIS — I471 Supraventricular tachycardia: Secondary | ICD-10-CM | POA: Diagnosis not present

## 2018-06-30 DIAGNOSIS — J449 Chronic obstructive pulmonary disease, unspecified: Secondary | ICD-10-CM | POA: Diagnosis not present

## 2018-06-30 NOTE — Patient Instructions (Signed)
Medication Instructions: Your physician recommends that you continue on your current medications as directed. Please refer to the Current Medication list given to you today.   Labwork: None today  Procedures/Testing: None today  Follow-Up: 6 months with Dr.McDowell  Any Additional Special Instructions Will Be Listed Below (If Applicable).     If you need a refill on your cardiac medications before your next appointment, please call your pharmacy.   

## 2018-07-02 ENCOUNTER — Other Ambulatory Visit: Payer: Self-pay | Admitting: Pulmonary Disease

## 2018-07-02 DIAGNOSIS — M545 Low back pain, unspecified: Secondary | ICD-10-CM

## 2018-07-02 DIAGNOSIS — I10 Essential (primary) hypertension: Secondary | ICD-10-CM | POA: Diagnosis not present

## 2018-07-02 DIAGNOSIS — J9611 Chronic respiratory failure with hypoxia: Secondary | ICD-10-CM | POA: Diagnosis not present

## 2018-07-02 DIAGNOSIS — J449 Chronic obstructive pulmonary disease, unspecified: Secondary | ICD-10-CM | POA: Diagnosis not present

## 2018-07-07 ENCOUNTER — Ambulatory Visit (INDEPENDENT_AMBULATORY_CARE_PROVIDER_SITE_OTHER): Payer: Medicare Other | Admitting: Internal Medicine

## 2018-07-07 ENCOUNTER — Encounter (INDEPENDENT_AMBULATORY_CARE_PROVIDER_SITE_OTHER): Payer: Self-pay | Admitting: Internal Medicine

## 2018-07-07 VITALS — BP 138/66 | HR 112 | Temp 98.0°F | Resp 18 | Ht 63.0 in | Wt 142.2 lb

## 2018-07-07 DIAGNOSIS — Z8719 Personal history of other diseases of the digestive system: Secondary | ICD-10-CM | POA: Diagnosis not present

## 2018-07-07 DIAGNOSIS — H01003 Unspecified blepharitis right eye, unspecified eyelid: Secondary | ICD-10-CM | POA: Diagnosis not present

## 2018-07-07 DIAGNOSIS — K580 Irritable bowel syndrome with diarrhea: Secondary | ICD-10-CM

## 2018-07-07 DIAGNOSIS — H01006 Unspecified blepharitis left eye, unspecified eyelid: Secondary | ICD-10-CM

## 2018-07-07 MED ORDER — AZITHROMYCIN 1 % OP SOLN: 1.0000 [drp] | Freq: Two times a day (BID) | OPHTHALMIC | 1 refills | Status: DC

## 2018-07-07 NOTE — Progress Notes (Signed)
Presenting complaint;  Follow-up for abdominal pain/diverticulitis and IBS.  Subjective:  Patient is 63 year old Caucasian female who has history of recurrent diverticulitis and IBS who is here for scheduled visit.  She was last seen in September 2019. She says she is doing very well as far as her GI system is concerned.  She is taking dicyclomine once in a while.  She has 2-3 bowel movements per day.  Most of her stools are formed.  She may have 1 or 2 loose or mushy stools per week.  She has not had any accidents.  Lately she has not passed any blood per rectum.  She also has noted decrease in urgency and may have mild discomfort once in a while.  Her appetite is good.  Her weight has been stable. She is complaining of burning in both eyes since she has been using polymyxin seen neomycin dexamethasone ophthalmic solution that was given to her by Dr. Luan Pulling for blepharitis.  Current Medications: Outpatient Encounter Medications as of 07/07/2018  Medication Sig  . acyclovir (ZOVIRAX) 800 MG tablet Take 800 mg by mouth 5 (five) times daily as needed (for break outs).   Marland Kitchen albuterol (PROAIR HFA) 108 (90 BASE) MCG/ACT inhaler Inhale 2 puffs into the lungs every 6 (six) hours as needed for wheezing or shortness of breath.   . ALPRAZolam (XANAX) 0.25 MG tablet Take 0.25 mg by mouth as needed for anxiety.   Marland Kitchen aspirin 81 MG tablet Take 1 tablet (81 mg total) by mouth daily.  Marland Kitchen atorvastatin (LIPITOR) 40 MG tablet Take 40 mg by mouth at bedtime  . carboxymethylcellulose (REFRESH) 1 % ophthalmic solution Place 2 drops into both eyes 3 (three) times daily as needed (for dry eyes).  . cetirizine (ZYRTEC) 10 MG tablet Take 10 mg by mouth daily.    . clonazePAM (KLONOPIN) 0.5 MG tablet Take 1 tablet (0.5 mg total) by mouth at bedtime as needed for anxiety.  Marland Kitchen diltiazem (CARDIZEM CD) 240 MG 24 hr capsule TAKE 1 CAPSULE BY MOUTH DAILY  . diltiazem (CARDIZEM) 30 MG tablet Take 1-2 tablets every 6 hours as needed  for persistent tachycardai  . escitalopram (LEXAPRO) 20 MG tablet Take 20 mg by mouth daily.    . Fluticasone-Salmeterol (ADVAIR) 250-50 MCG/DOSE AEPB Inhale 1 puff into the lungs 2 (two) times daily.   Marland Kitchen guaiFENesin (MUCINEX) 600 MG 12 hr tablet Take 600 mg by mouth 2 (two) times daily as needed for cough or to loosen phlegm.   Marland Kitchen ibuprofen (ADVIL,MOTRIN) 200 MG tablet Take 400 mg by mouth every 6 (six) hours as needed for moderate pain.  Marland Kitchen ipratropium-albuterol (DUONEB) 0.5-2.5 (3) MG/3ML SOLN Take 3 mLs by nebulization 3 (three) times daily. (Patient taking differently: Take 3 mLs by nebulization 3 (three) times daily as needed (for shortness of breath or wheezing). )  . montelukast (SINGULAIR) 10 MG tablet Take 1 tablet (10 mg total) by mouth at bedtime.  . Multiple Vitamin (MULTIVITAMIN WITH MINERALS) TABS tablet Take 1 tablet by mouth daily.  Marland Kitchen neomycin-polymyxin b-dexamethasone (MAXITROL) 3.5-10000-0.1 SUSP Place 2 drops into both eyes 4 (four) times daily.   . ondansetron (ZOFRAN) 4 MG tablet Take 1 tablet (4 mg total) every 8 (eight) hours as needed by mouth for nausea or vomiting.  Marland Kitchen oxybutynin (DITROPAN) 5 MG tablet Take 5 mg by mouth daily.   . OXYGEN Inhale 4 L into the lungs continuous.  . roflumilast (DALIRESP) 500 MCG TABS tablet Take 500 mcg by mouth daily.    Marland Kitchen  sodium chloride (OCEAN) 0.65 % SOLN nasal spray Place 2-3 sprays into both nostrils as needed for congestion.  Marland Kitchen SPIRIVA RESPIMAT 2.5 MCG/ACT AERS Inhale 2 puffs into the lungs daily.   . traMADol (ULTRAM) 50 MG tablet Take 50 mg by mouth every 6 (six) hours as needed for moderate pain.   . traZODone (DESYREL) 50 MG tablet Take 50 mg by mouth at bedtime as needed for sleep.  Marland Kitchen triamcinolone (NASACORT ALLERGY 24HR) 55 MCG/ACT AERO nasal inhaler Place 2 sprays into the nose 2 (two) times daily as needed (for allergies).   Marland Kitchen dicyclomine (BENTYL) 10 MG capsule Take 1 capsule (10 mg total) by mouth 3 (three) times daily as needed  for spasms. (Patient not taking: Reported on 07/07/2018)   No facility-administered encounter medications on file as of 07/07/2018.      Objective: Blood pressure 138/66, pulse (!) 112, temperature 98 F (36.7 C), temperature source Oral, resp. rate 18, height 5\' 3"  (1.6 m), weight 142 lb 3.2 oz (64.5 kg). Patient is alert and in no acute distress. Patient is using nasal O2. Conjunctiva is pink. Sclera is nonicteric Oropharyngeal mucosa is normal. No neck masses or thyromegaly noted. Cardiac exam with regular rhythm normal S1 and S2. No murmur or gallop noted. Breath sounds are diminished over low both lung bases. Abdomen is symmetrical.  On palpation is soft.  She has mild tenderness at LLQ on deep palpation.  No organomegaly or masses. No LE edema or clubbing noted.    Assessment:  #1.  Irritable bowel syndrome with diarrhea.  She continues to do better and she is using dicyclomine once in a while.  #2.  History of sigmoid diverticulitis.  She has had this for the past few years.  She has had multiple CTs which have been quite abnormal.  I suspect she has chronic diverticulitis.  Presently she is not having any symptoms other than sporadic mild pain.  Surgery has been considered in the past but not considered because she is high risk on account of pulmonary issues.  #3.  Blepharitis.  She is allergic to neomycin.   Plan:  Discontinue polymyxin/neomycin with dexamethasone because of allergy to neomycin. Discussed with Dr. Luan Pulling over the phone.  Prescription sent for azithromycin 1% ophthalmic solution 1 drop twice daily. Once again patient reminded to use ibuprofen only when absolutely necessary. Patient will call if she has rectal bleeding or abdominal pain. Office visit in 6 months.

## 2018-07-07 NOTE — Patient Instructions (Signed)
If you experience any side effects with azithromycin eyedrops stop this medication and call Dr. Luan Pulling.

## 2018-07-09 ENCOUNTER — Ambulatory Visit (HOSPITAL_COMMUNITY): Payer: Medicare Other

## 2018-07-13 ENCOUNTER — Ambulatory Visit (HOSPITAL_COMMUNITY)
Admission: RE | Admit: 2018-07-13 | Discharge: 2018-07-13 | Disposition: A | Discharge status: Home / Self Care | Payer: Medicare Other | Source: Ambulatory Visit | Attending: Pulmonary Disease | Admitting: Pulmonary Disease

## 2018-07-13 DIAGNOSIS — M545 Low back pain, unspecified: Secondary | ICD-10-CM

## 2018-07-14 DIAGNOSIS — H1045 Other chronic allergic conjunctivitis: Secondary | ICD-10-CM | POA: Diagnosis not present

## 2019-01-12 ENCOUNTER — Ambulatory Visit (INDEPENDENT_AMBULATORY_CARE_PROVIDER_SITE_OTHER): Payer: Medicare Other | Admitting: Internal Medicine

## 2019-01-13 ENCOUNTER — Ambulatory Visit: Payer: Medicare Other | Admitting: Cardiology

## 2019-02-25 ENCOUNTER — Ambulatory Visit: Payer: Medicare Other | Admitting: Cardiology

## 2019-03-22 ENCOUNTER — Other Ambulatory Visit: Payer: Self-pay | Admitting: Cardiology

## 2019-03-24 ENCOUNTER — Telehealth: Payer: Self-pay | Admitting: Cardiology

## 2019-03-24 NOTE — Telephone Encounter (Signed)

## 2019-03-25 NOTE — Progress Notes (Signed)
Virtual Visit via Telephone Note   This visit type was conducted due to national recommendations for restrictions regarding the COVID-19 Pandemic (e.g. social distancing) in an effort to limit this patient's exposure and mitigate transmission in our community.  Due to her co-morbid illnesses, this patient is at least at moderate risk for complications without adequate follow up.  This format is felt to be most appropriate for this patient at this time.  The patient did not have access to video technology/had technical difficulties with video requiring transitioning to audio format only (telephone).  All issues noted in this document were discussed and addressed.  No physical exam could be performed with this format.  Please refer to the patient's chart for her  consent to telehealth for [facility].   Date:  03/26/2019   ID:  [name], DOB 11-25-1955, MRN [id number]  Patient Location: Home Provider Location: Office  PCP:  Kari Baars, MD  Cardiologist:  Nona Dell, MD Electrophysiologist:  None   Evaluation Performed:  Follow-Up Visit  Chief Complaint:  Cardiac follow-up  History of Present Illness:    [name] is a 63 y.o. female last seen in February.  We spoke by phone today.  She states that she has been doing well.  She has been very aggressive about social distancing, stays in her house most of the time, her groceries are delivered by family member.  She has not been out to any medical visits either but does plan to see Dr. Juanetta Gosling before he retires at the end of the year.  She will be transitioning to Dr. Judee Clara for primary care and will have a separate pulmonary specialist.  She does not report any chest pain or palpitations, no dizziness or syncope.  I reviewed her medications which are stable from a cardiac perspective and outlined below.  The patient does not have symptoms concerning for COVID-19 infection (fever, chills, cough, or new shortness of  breath).    Past Medical History:  Diagnosis Date  . Anxiety   . COPD (chronic obstructive pulmonary disease) (HCC)    Home oxygen at [facility]  . Depression   . Diverticulosis   . Dyspnea   . History of cardiac catheterization    No significant CAD 2005  . Hyperlipidemia   . Hypoglycemia   . IBS (irritable bowel syndrome)   . Pneumonia 2015  . Shingles 2012  . Sleep apnea   . SVT (supraventricular tachycardia) (HCC)    Past Surgical History:  Procedure Laterality Date  . BACK SURGERY    . CARDIAC CATHETERIZATION  2004  . COLONOSCOPY  03/22/2011   Procedure: COLONOSCOPY;  Surgeon: Malissa Hippo, MD;  [facility];  Service: Endoscopy;  Laterality: N/A;  . COLONOSCOPY WITH PROPOFOL N/A 05/16/2017   Procedure: COLONOSCOPY WITH PROPOFOL;  Surgeon: Malissa Hippo, MD;  Location[facility];  Service: Endoscopy;  Laterality: N/A;  . FLEXIBLE SIGMOIDOSCOPY N/A 09/26/2017   Procedure: FLEXIBLE SIGMOIDOSCOPY with Propofol ;  Surgeon: Malissa Hippo, MD;  [facility];  Service: Endoscopy;  Laterality: N/A;  9:30  . POLYPECTOMY  05/16/2017   Procedure: POLYPECTOMY;  Surgeon: Malissa Hippo, MD;  Location[facility];  Service: Endoscopy;;  . SPINAL FUSION       Current Meds  Medication Sig  . acyclovir (ZOVIRAX) 800 MG tablet Take 800 mg by mouth 5 (five) times daily as needed (for break outs).   Marland Kitchen albuterol (PROAIR HFA)  108 (90 BASE) MCG/ACT inhaler Inhale 2 puffs into the lungs every 6 (six) hours as needed for wheezing or shortness of breath.   . ALPRAZolam (XANAX) 0.25 MG tablet Take 0.25 mg by mouth 3 (three) times daily as needed for anxiety.   Marland Kitchen aspirin 81 MG tablet Take 1 tablet (81 mg total) by mouth daily.  Marland Kitchen atorvastatin (LIPITOR) 40 MG tablet TAKE (1) TABLET BY MOUTH ONCE DAILY.  Marland Kitchen azithromycin (AZASITE) 1 % ophthalmic solution Place 1 drop into both eyes 2 (two) times daily.  . carboxymethylcellulose (REFRESH) 1 % ophthalmic solution Place 2  drops into both eyes 3 (three) times daily as needed (for dry eyes).  . cetirizine (ZYRTEC) 10 MG tablet Take 10 mg by mouth daily.    . clonazePAM (KLONOPIN) 0.5 MG tablet Take 1 tablet (0.5 mg total) by mouth at bedtime as needed for anxiety.  . dicyclomine (BENTYL) 10 MG capsule Take 1 capsule (10 mg total) by mouth 3 (three) times daily as needed for spasms.  Marland Kitchen diltiazem (CARDIZEM CD) 240 MG 24 hr capsule TAKE 1 CAPSULE BY MOUTH DAILY  . diltiazem (CARDIZEM) 30 MG tablet Take 1-2 tablets every 6 hours as needed for persistent tachycardai  . escitalopram (LEXAPRO) 20 MG tablet Take 20 mg by mouth daily.    . Fluticasone-Salmeterol (ADVAIR) 250-50 MCG/DOSE AEPB Inhale 1 puff into the lungs 2 (two) times daily.   Marland Kitchen guaiFENesin (MUCINEX) 600 MG 12 hr tablet Take 600 mg by mouth 2 (two) times daily as needed for cough or to loosen phlegm.   Marland Kitchen ibuprofen (ADVIL,MOTRIN) 200 MG tablet Take 400 mg by mouth every 6 (six) hours as needed for moderate pain.  Marland Kitchen ipratropium-albuterol (DUONEB) 0.5-2.5 (3) MG/3ML SOLN Take 3 mLs by nebulization 3 (three) times daily. (Patient taking differently: Take 3 mLs by nebulization 3 (three) times daily as needed (for shortness of breath or wheezing). )  . montelukast (SINGULAIR) 10 MG tablet Take 1 tablet (10 mg total) by mouth at bedtime.  . Multiple Vitamin (MULTIVITAMIN WITH MINERALS) TABS tablet Take 1 tablet by mouth daily.  . ondansetron (ZOFRAN) 4 MG tablet Take 1 tablet (4 mg total) every 8 (eight) hours as needed by mouth for nausea or vomiting.  Marland Kitchen oxybutynin (DITROPAN) 5 MG tablet Take 5 mg by mouth daily.   . OXYGEN Inhale 4 L into the lungs continuous.  . roflumilast (DALIRESP) 500 MCG TABS tablet Take 500 mcg by mouth daily.    . sodium chloride (OCEAN) 0.65 % SOLN nasal spray Place 2-3 sprays into both nostrils as needed for congestion.  Marland Kitchen SPIRIVA RESPIMAT 2.5 MCG/ACT AERS Inhale 2 puffs into the lungs daily.   . traMADol (ULTRAM) 50 MG tablet Take 50 mg  by mouth every 6 (six) hours as needed for moderate pain.   . traZODone (DESYREL) 50 MG tablet Take 50 mg by mouth at bedtime as needed for sleep.  Marland Kitchen triamcinolone (NASACORT ALLERGY 24HR) 55 MCG/ACT AERO nasal inhaler Place 2 sprays into the nose 2 (two) times daily as needed (for allergies).      Allergies:   Iohexol, Neosporin [neomycin-bacitracin zn-polymyx], Adhesive [tape], Chantix [varenicline], Ketek [telithromycin], and Penicillins   Social History   Tobacco Use  . Smoking status: Former Smoker    Packs/day: 1.00    Years: 40.00    Pack years: 40.00    Types: Cigarettes    Quit date: 05/07/2015    Years since quitting: 3.8  . Smokeless tobacco: Never Used  .  Tobacco comment: presently smoking about 2 a day  Substance Use Topics  . Alcohol use: Yes    Alcohol/week: 0.0 standard drinks    Comment: Rare  . Drug use: No     Family Hx: The patient's family history includes Asthma in her mother; Diabetes in her mother; Heart attack in her mother; Heart failure in her mother. There is no history of Colon cancer.  ROS:   Please see the history of present illness. All other systems reviewed and are negative.   Prior CV studies:   The following studies were reviewed today:  Echocardiogram2/20/2017: Study Conclusions  - Left ventricle: The cavity size was normal. Wall thickness was normal. Systolic function was normal. The estimated ejection fraction was in the range of 60% to 65%. Wall motion was normal; there were no regional wall motion abnormalities. Doppler parameters are consistent with abnormal left ventricular relaxation (grade 1 diastolic dysfunction). - Left atrium: The atrium was mildly dilated.  Labs/Other Tests and Data Reviewed:    EKG:  An ECG dated 06/30/2018 was personally reviewed today and demonstrated:  Sinus rhythm with nonspecific ST-T changes.  Recent Labs:  09/22/2017: BUN 15; Creatinine, Ser 0.85; Hemoglobin 12.1; Platelets 308;  Potassium 3.9; Sodium 137   Wt Readings from Last 3 Encounters:  03/26/19 135 lb (61.2 kg)  07/07/18 142 lb 3.2 oz (64.5 kg)  06/30/18 143 lb (64.9 kg)     Objective:    Vital Signs:  BP 138/72   [name] 96   Ht 5\' 1"  (1.549 m)   Wt 135 lb (61.2 kg)   BMI 25.51 kg/m    Patient spoke in full sentences, not short of breath. No audible wheezing or coughing. Speech pattern normal.  ASSESSMENT & PLAN:    1.  PSVT, quiescent at this time on medical therapy including Cardizem CD 240 mg daily with as needed short acting Cardizem.  She does not report any palpitations at this point.  2.  Severe, oxygen dependent COPD.  She is following with Dr. Juanetta Gosling and will transition to [facility] after his retirement.   COVID-19 Education: The signs and symptoms of COVID-19 were discussed with the patient and how to seek care for testing (follow up with PCP or arrange E-visit).  The importance of social distancing was discussed today.  Time:   Today, I have spent 8 minutes with the patient with telehealth technology discussing the above problems.     Medication Adjustments/Labs and Tests Ordered: Current medicines are reviewed at length with the patient today.  Concerns regarding medicines are outlined above.   Tests Ordered: No orders of the defined types were placed in this encounter.   Medication Changes: No orders of the defined types were placed in this encounter.   Follow Up:  Either In Person or Virtual 6 months.  Signed, Nona Dell, MD  03/26/2019 9:43 AM    [facility]

## 2019-03-26 ENCOUNTER — Telehealth (INDEPENDENT_AMBULATORY_CARE_PROVIDER_SITE_OTHER): Payer: Medicare Other | Admitting: Cardiology

## 2019-03-26 ENCOUNTER — Encounter: Payer: Self-pay | Admitting: Cardiology

## 2019-03-26 VITALS — BP 138/72 | HR 96 | Ht 61.0 in | Wt 135.0 lb

## 2019-03-26 DIAGNOSIS — I471 Supraventricular tachycardia: Secondary | ICD-10-CM

## 2019-03-26 DIAGNOSIS — J449 Chronic obstructive pulmonary disease, unspecified: Secondary | ICD-10-CM | POA: Diagnosis not present

## 2019-03-26 NOTE — Patient Instructions (Signed)
Medication Instructions:  Your physician recommends that you continue on your current medications as directed. Please refer to the Current Medication list given to you today.  *If you need a refill on your cardiac medications before your next appointment, please call your pharmacy*  Lab Work: None today If you have labs (blood work) drawn today and your tests are completely normal, you will receive your results only by: Marland Kitchen MyChart Message (if you have MyChart) OR . A paper copy in the mail If you have any lab test that is abnormal or we need to change your treatment, we will call you to review the results.  Testing/Procedures: None today  Follow-Up: At [facility], you and your health needs are our priority.  As part of our continuing mission to provide you with exceptional heart care, we have created designated Provider Care Teams.  These Care Teams include your primary Cardiologist (physician) and Advanced Practice Providers (APPs -  Physician Assistants and Nurse Practitioners) who all work together to provide you with the care you need, when you need it.  Your next appointment:   6 month(s)  The format for your next appointment:   Virtual Visit   Provider:   Rozann Lesches, MD  Other Instructions None       Thank you for choosing [facility] !

## 2019-04-08 DIAGNOSIS — Z23 Encounter for immunization: Secondary | ICD-10-CM | POA: Diagnosis not present

## 2019-04-08 DIAGNOSIS — J9611 Chronic respiratory failure with hypoxia: Secondary | ICD-10-CM | POA: Diagnosis not present

## 2019-04-08 DIAGNOSIS — I10 Essential (primary) hypertension: Secondary | ICD-10-CM | POA: Diagnosis not present

## 2019-04-08 DIAGNOSIS — J301 Allergic rhinitis due to pollen: Secondary | ICD-10-CM | POA: Diagnosis not present

## 2019-04-08 DIAGNOSIS — J449 Chronic obstructive pulmonary disease, unspecified: Secondary | ICD-10-CM | POA: Diagnosis not present

## 2019-05-11 ENCOUNTER — Ambulatory Visit (INDEPENDENT_AMBULATORY_CARE_PROVIDER_SITE_OTHER): Payer: Medicare Other | Admitting: Family Medicine

## 2019-05-11 ENCOUNTER — Encounter: Payer: Self-pay | Admitting: Family Medicine

## 2019-05-11 ENCOUNTER — Other Ambulatory Visit: Payer: Self-pay

## 2019-05-11 VITALS — BP 140/80 | HR 126 | Temp 98.6°F

## 2019-05-11 DIAGNOSIS — R000 Tachycardia, unspecified: Secondary | ICD-10-CM

## 2019-05-11 DIAGNOSIS — R7309 Other abnormal glucose: Secondary | ICD-10-CM

## 2019-05-11 DIAGNOSIS — J449 Chronic obstructive pulmonary disease, unspecified: Secondary | ICD-10-CM | POA: Diagnosis not present

## 2019-05-11 DIAGNOSIS — E785 Hyperlipidemia, unspecified: Secondary | ICD-10-CM

## 2019-05-11 DIAGNOSIS — K588 Other irritable bowel syndrome: Secondary | ICD-10-CM

## 2019-05-11 DIAGNOSIS — K5732 Diverticulitis of large intestine without perforation or abscess without bleeding: Secondary | ICD-10-CM

## 2019-05-11 NOTE — Patient Instructions (Addendum)
xyzal -take at night -instead of zyrtec aveno lotion/cream and bath Hydrocortisone cream-use on chest wall  Fasting labwork Pulmonary referral Dupuytren's Contracture Dupuytren's contracture is a condition in which tissue under the skin of the palm becomes thick. This causes one or more of the fingers to curl inward (contract) toward the palm. After a while, the fingers may not be able to straighten out. This condition affects some or all of the fingers and the palm of the hand. This condition may affect one or both hands. Dupuytren's contracture is a long-term (chronic) condition that develops (progresses) slowly over time. There is no cure, but symptoms can be managed and progression can be slowed with treatment. This condition is usually not dangerous or painful, but it can interfere with everyday tasks. What are the causes?  This condition is caused by tissue (fascia) in the palm that gets thicker and tighter. When the fascia thickens, it pulls on the cords of tissue (tendons) that control finger movement. This causes the fingers to contract. The cause of fascia thickening is not known. However, the condition is often passed along from parent to child (inherited). What increases the risk? The following factors may make you more likely to develop this condition:  Being 84 years of age or older.  Being female.  Having a family history of this condition.  Using tobacco products, including cigarettes, chewing tobacco, and e-cigarettes.  Drinking alcohol excessively.  Having diabetes.  Having a seizure disorder. What are the signs or symptoms? Early symptoms of this condition may include:  Thick, puckered skin on the hand.  One or more lumps (nodules) on the palm. Nodules may be tender when they first appear, but they are generally painless. Later symptoms of this condition may include:  Thick cords of tissue in the palm.  Fingers curled up toward the palm.  Inability to  straighten the fingers into their normal position. Though this condition is usually painless, you may have discomfort when holding or grabbing objects. How is this diagnosed? This condition is diagnosed with a physical exam, which may include:  Looking at your hands and feeling your palms. This is to check for thickened fascia and nodules.  Measuring finger motion.  Doing the [place] tabletop test. You may be asked to try to put your hand on a surface, with your palm down and your fingers straight out. How is this treated? There is no cure for this condition, but treatment can relieve discomfort and make symptoms more manageable. Treatment options may include:  Physical therapy. This can strengthen your hand and increase flexibility.  Occupational therapy. This can help you with everyday tasks that may be more difficult because of your condition.  Shots (injections). Substances may be injected into your hand, such as: ? Medicines that help to decrease swelling (corticosteroids). ? Proteins (collagenase) to weaken thick tissue. After a collagenase injection, your health care provider may stretch your fingers.  Needle aponeurotomy. A needle is pushed through the skin and into the fascia. Moving the needle against the fascia can weaken or break up the thick tissue.  Surgery. This may be needed if your condition causes discomfort or interferes with everyday activities. Physical therapy is usually needed after surgery. No treatment is guaranteed to cure this condition. Recurrence of symptoms is common. Follow these instructions at home: Hand care  Take these actions to help protect your hand from possible injury: ? Use tools that have padded grips. ? Wear protective gloves while you work with your  hands. ? Avoid repetitive hand movements. General instructions  Take over-the-counter and prescription medicines only as told by your health care provider.  Manage any other conditions that  you have, such as diabetes.  If physical therapy was prescribed, do exercises as told by your health care provider.  Do not use any products that contain nicotine or tobacco, such as cigarettes, e-cigarettes, and chewing tobacco. If you need help quitting, ask your health care provider.  If you drink alcohol: ? Limit how much you use to:  0-1 drink a day for women.  0-2 drinks a day for men. ? Be aware of how much alcohol is in your drink. In the [place] one drink equals one 12 oz bottle of beer (355 mL), one 5 oz glass of wine (148 mL), or one 1 oz glass of hard liquor (44 mL).  Keep all follow-up visits as told by your health care provider. This is important. Contact a health care provider if:  You develop new symptoms, or your symptoms get worse.  You have pain that gets worse or does not get better with medicine.  You have difficulty or discomfort with everyday tasks.  You develop numbness or tingling. Get help right away if:  You have severe pain.  Your fingers change color or become unusually cold. Summary  Dupuytren's contracture is a condition in which tissue under the skin of the palm becomes thick.  This condition is caused by tissue (fascia) that thickens. When it thickens, it pulls on the cords of tissue (tendons) that control finger movement and makes the fingers to contract.  You are more likely to develop this condition if you are a man, are over 60 years of age, have a family history of the condition, and drink a lot of alcohol.  This condition can be treated with physical and occupational therapy, injections, and surgery.  Follow instructions about how to care for your hand. Get help right away if you have severe pain or your fingers change color or become cold. This information is not intended to replace advice given to you by your health care provider. Make sure you discuss any questions you have with your health care provider. Document Revised: 11/11/2017  Document Reviewed: 11/11/2017 Elsevier Patient Education  [place].  Atopic Dermatitis Atopic dermatitis is a skin disorder that causes inflammation of the skin. This is the most common type of eczema. Eczema is a group of skin conditions that cause the skin to be itchy, red, and swollen. This condition is generally worse during the cooler winter months and often improves during the warm summer months. Symptoms can vary from person to person. Atopic dermatitis usually starts showing signs in infancy and can last through adulthood. This condition cannot be passed from one person to another (non-contagious), but it is more common in families. Atopic dermatitis may not always be present. When it is present, it is called a flare-up. What are the causes? The exact cause of this condition is not known. Flare-ups of the condition may be triggered by:  Contact with something that you are sensitive or allergic to.  Stress.  Certain foods.  Extremely hot or cold weather.  Harsh chemicals and soaps.  Dry air.  Chlorine. What increases the risk? This condition is more likely to develop in people who have a personal history or family history of eczema, allergies, asthma, or hay fever. What are the signs or symptoms? Symptoms of this condition include:  Dry, scaly skin.  Red, itchy rash.  Itchiness, which can be severe. This may occur before the skin rash. This can make sleeping difficult.  Skin thickening and cracking that can occur over time. How is this diagnosed? This condition is diagnosed based on your symptoms, a medical history, and a physical exam. How is this treated? There is no cure for this condition, but symptoms can usually be controlled. Treatment focuses on:  Controlling the itchiness and scratching. You may be given medicines, such as antihistamines or steroid creams.  Limiting exposure to things that you are sensitive or allergic to (allergens).  Recognizing  situations that cause stress and developing a plan to manage stress. If your atopic dermatitis does not get better with medicines, or if it is all over your body (widespread), a treatment using a specific type of light (phototherapy) may be used. Follow these instructions at home: Skin care   Keep your skin well-moisturized. Doing this seals in moisture and helps to prevent dryness. ? Use unscented lotions that have petroleum in them. ? Avoid lotions that contain alcohol or water. They can dry the skin.  Keep baths or showers short (less than 5 minutes) in warm water. Do not use hot water. ? Use mild, unscented cleansers for bathing. Avoid soap and bubble bath. ? Apply a moisturizer to your skin right after a bath or shower.  Do not apply anything to your skin without checking with your health care provider. General instructions  Dress in clothes made of cotton or cotton blends. Dress lightly because heat increases itchiness.  When washing your clothes, rinse your clothes twice so all of the soap is removed.  Avoid any triggers that can cause a flare-up.  Try to manage your stress.  Keep your fingernails cut short.  Avoid scratching. Scratching makes the rash and itchiness worse. It may also result in a skin infection (impetigo) due to a break in the skin caused by scratching.  Take or apply over-the-counter and prescription medicines only as told by your health care provider.  Keep all follow-up visits as told by your health care provider. This is important.  Do not be around people who have cold sores or fever blisters. If you get the infection, it may cause your atopic dermatitis to worsen. Contact a health care provider if:  Your itchiness interferes with sleep.  Your rash gets worse or it is not better within one week of starting treatment.  You have a fever.  You have a rash flare-up after having contact with someone who has cold sores or fever blisters. Get help  right away if:  You develop pus or soft yellow scabs in the rash area. Summary  This condition causes a red rash and itchy, dry, scaly skin.  Treatment focuses on controlling the itchiness and scratching, limiting exposure to things that you are sensitive or allergic to (allergens), recognizing situations that cause stress, and developing a plan to manage stress.  Keep your skin well-moisturized.  Keep baths or showers shorter than 5 minutes and use warm water. Do not use hot water. This information is not intended to replace advice given to you by your health care provider. Make sure you discuss any questions you have with your health care provider. Document Revised: 08/11/2018 Document Reviewed: 05/24/2016 Elsevier Patient Education  [place].

## 2019-05-11 NOTE — Progress Notes (Signed)
New Patient Office Visit  Subjective:  Patient ID: [name], female    DOB: 1955-10-13  Age: 64 y.o. MRN: [id number]  CC:  Chief Complaint  Patient presents with  . Establish Care  HTN/Depression/Hyperlipidemia/IBS Gastro note 1.  Irritable bowel syndrome with diarrhea.  She continues to do better and she is using dicyclomine once in a while. 2.  History of sigmoid diverticulitis.  She has had this for the past few years.  She has had multiple CTs which have been quite abnormal.  I suspect she has chronic diverticulitis.  Presently she is not having any symptoms other than sporadic mild pain.  Surgery has been considered in the past but not considered because she is high risk on account of pulmonary issues. 3.  Blepharitis.  She is allergic to neomycin. Plan: Discontinue polymyxin/neomycin with dexamethasone because of allergy to neomycin. Once again patient reminded to use ibuprofen only when absolutely necessary. Patient will call if she has rectal bleeding or abdominal pain. .  Irritable bowel syndrome with diarrhea.  She continues to do better and she is using dicyclomine once in a while.  #2.  History of sigmoid diverticulitis.  She has had this for the past few years.  She has had multiple CTs which have been quite abnormal.  I suspect she has chronic diverticulitis.  Presently she is not having any symptoms other than sporadic mild pain.  Surgery has been considered in the past but not considered because she is high risk on account of pulmonary issues.  #3.  Blepharitis.  She is allergic to neomycin. HPI DAWNEL [name] presents for HTN-cardio following-last note reviewed 1.  PSVT, quiescent at this time on medical therapy including Cardizem CD 240 mg daily with as needed short acting Cardizem.   2.  Severe, oxygen dependent COPD.  She is following with Dr. Juanetta Gosling and will transition to [facility] after his retirement. COPD Continuous 4-7L 24/7-laying down  -bipap-sleep  Echocardiogram2/20/2017: Study Conclusions  - Left ventricle: The cavity size was normal. Wall thickness was normal. Systolic function was normal. The estimated ejection fraction was in the range of 60% to 65%. Wall motion was normal; there were no regional wall motion abnormalities. Doppler parameters are consistent with abnormal left ventricular relaxation (grade 1 diastolic dysfunction). - Left atrium: The atrium was mildly dilated.  Hyperlipidemia-atorvastatin-stable reviewed controlled substances Fill Date ID   Written Drug Qty Days Prescriber Rx # Pharmacy Refill   Daily Dose* Pymt Type PMP    04/09/2019  1   04/08/2019  Alprazolam 0.25 MG Tablet Hyper 90.00  30 Ed Haw   [id number]   Car ([id number])   0  1.50 LME  Comm Ins   [place]  04/09/2019  1   04/08/2019  Tramadol Hcl 50 MG Tablet  120.00  30 Ed Haw   [id number]   Car ([id number])   0  20.00 MME  Private Pay   [place]  01/14/2018  1   01/14/2018  Tramadol Hcl 50 MG Tablet  120.00  30 Ed Haw   [id number]   Car ([id number])   0  20.00 MME  Private Pay   [place]  08/18/2017  1   08/18/2017  Alprazolam 0.25 MG Tablet  90.00  30 Ed Haw   [id number]   Car ([id number])   0  1.50 LME  Comm Ins   [place]    Past Medical History:  Diagnosis Date  . Allergy   . Anxiety   . Arthritis   .  COPD (chronic obstructive pulmonary disease) (HCC)    Home oxygen at [facility]  . Depression   . Diverticulosis   . Dyspnea   . Emphysema of lung (HCC)   . History of cardiac catheterization    No significant CAD 2005  . Hyperlipidemia   . Hypoglycemia   . IBS (irritable bowel syndrome)   . Neuromuscular disorder (HCC)   . Osteoporosis   . Pneumonia 2015  . Shingles 2012  . Sleep apnea   . SVT (supraventricular tachycardia) (HCC)     Past Surgical History:  Procedure Laterality Date  . BACK SURGERY    . BREAST SURGERY    . CARDIAC CATHETERIZATION  2004  . COLONOSCOPY  03/22/2011   Procedure: COLONOSCOPY;  Surgeon: Cynthia Hippo, MD;  [facility];   Service: Endoscopy;  Laterality: N/A;  . COLONOSCOPY WITH PROPOFOL N/A 05/16/2017   Procedure: COLONOSCOPY WITH PROPOFOL;  Surgeon: Cynthia Hippo, MD;  Location: [facility];  Service: Endoscopy;  Laterality: N/A;  . FLEXIBLE SIGMOIDOSCOPY N/A 09/26/2017   Procedure: FLEXIBLE SIGMOIDOSCOPY with Propofol ;  Surgeon: Cynthia Hippo, MD;  [facility];  Service: Endoscopy;  Laterality: N/A;  9:30  . POLYPECTOMY  05/16/2017   Procedure: POLYPECTOMY;  Surgeon: Cynthia Hippo, MD;  [facility];  Service: Endoscopy;;  . SPINAL FUSION      Family History  Problem Relation Age of Onset  . Asthma Mother   . Heart attack Mother   . Diabetes Mother   . Heart failure Mother   . Colon cancer Neg Hx     Social History   Socioeconomic History  . Marital status: Divorced    Spouse name: Not on file  . Number of children: Not on file  . Years of education: Not on file  . Highest education level: Not on file  Occupational History  . Not on file  Tobacco Use  . Smoking status: Former Smoker    Packs/day: 1.00    Years: 40.00    Pack years: 40.00    Types: Cigarettes    Quit date: 05/07/2015    Years since quitting: 4.0  . Smokeless tobacco: Never Used  . Tobacco comment: presently smoking about 2 a day  Substance and Sexual Activity  . Alcohol use: Yes    Alcohol/week: 0.0 standard drinks    Comment: Rare  . Drug use: No  . Sexual activity: Never  Other Topics Concern  . Not on file  Social History Narrative  . Not on file   Social Determinants of Health   Financial Resource Strain:   . Difficulty of Paying Living Expenses: Not on file  Food Insecurity:   . Worried About Programme researcher, broadcasting/film/video in the Last Year: Not on file  . Ran Out of Food in the Last Year: Not on file  Transportation Needs:   . Lack of Transportation (Medical): Not on file  . Lack of Transportation (Non-Medical): Not on file  Physical Activity:   . Days of Exercise per Week: Not on  file  . Minutes of Exercise per Session: Not on file  Stress:   . Feeling of Stress : Not on file  Social Connections:   . Frequency of Communication with Friends and Family: Not on file  . Frequency of Social Gatherings with Friends and Family: Not on file  . Attends Religious Services: Not on file  . Active Member of Clubs or Organizations: Not on  file  . Attends Banker Meetings: Not on file  . Marital Status: Not on file  Intimate Partner Violence:   . Fear of Current or Ex-Partner: Not on file  . Emotionally Abused: Not on file  . Physically Abused: Not on file  . Sexually Abused: Not on file    ROS Review of Systems  Constitutional: Positive for diaphoresis, fatigue and fever.  HENT: Positive for ear pain and tinnitus.   Eyes: Positive for itching.  Respiratory: Positive for apnea, cough, shortness of breath and wheezing.   Cardiovascular: Positive for chest pain, palpitations and leg swelling.  Gastrointestinal: Positive for blood in stool, constipation, diarrhea and nausea.  Endocrine: Positive for heat intolerance and polyuria.  Genitourinary: Positive for frequency and urgency.  Musculoskeletal: Positive for arthralgias, back pain, joint swelling, myalgias, neck pain and neck stiffness.  Skin: Positive for rash.  Allergic/Immunologic: Positive for environmental allergies.  Neurological: Positive for dizziness, tremors, light-headedness and numbness.  Hematological: Bruises/bleeds easily.  Psychiatric/Behavioral: Positive for decreased concentration and sleep disturbance. The patient is nervous/anxious.     Objective:   Today's Vitals: BP 140/80 (BP Location: Right Arm, Patient Position: Sitting, Cuff Size: Normal)   Pulse (!) 126   Temp 98.6 F (37 C) (Oral)   SpO2 98%   Physical Exam Constitutional:      Appearance: Normal appearance.  HENT:     Head: Normocephalic and atraumatic.  Eyes:     Conjunctiva/sclera: Conjunctivae normal.   Cardiovascular:     Rate and Rhythm: Normal rate and regular rhythm.  Pulmonary:     Effort: Pulmonary effort is normal.     Breath sounds: Normal breath sounds.  Musculoskeletal:     Cervical back: Normal range of motion and neck supple.  Neurological:     Mental Status: She is alert and oriented to person, place, and time.  Psychiatric:        Mood and Affect: Mood normal.        Behavior: Behavior normal.     Assessment & Plan:   1. Chronic obstructive pulmonary disease, unspecified COPD type (HCC) Advair/proair-currently stable - Ambulatory referral to Pulmonology  2. Hyperlipidemia, unspecified hyperlipidemia type atorvastatin - COMPLETE METABOLIC PANEL WITH GFR - Lipid panel  3. Tachycardia - TSH  4. Elevated glucose - Urinalysis - Hemoglobin A1c  5. Other irritable bowel syndrome GI following bentyl  6. Sigmoid diverticulitis - COMPLETE METABOLIC PANEL WITH GFR Outpatient Encounter Medications as of 05/11/2019  Medication Sig  . acyclovir (ZOVIRAX) 800 MG tablet Take 800 mg by mouth 5 (five) times daily as needed (for break outs).   Marland Kitchen albuterol (PROAIR HFA) 108 (90 BASE) MCG/ACT inhaler Inhale 2 puffs into the lungs every 6 (six) hours as needed for wheezing or shortness of breath.   . ALPRAZolam (XANAX) 0.25 MG tablet Take 0.25 mg by mouth 3 (three) times daily as needed for anxiety.   Marland Kitchen aspirin 81 MG tablet Take 1 tablet (81 mg total) by mouth daily.  Marland Kitchen atorvastatin (LIPITOR) 40 MG tablet TAKE (1) TABLET BY MOUTH ONCE DAILY.  Marland Kitchen azithromycin (AZASITE) 1 % ophthalmic solution Place 1 drop into both eyes 2 (two) times daily.  . carboxymethylcellulose (REFRESH) 1 % ophthalmic solution Place 2 drops into both eyes 3 (three) times daily as needed (for dry eyes).  . cetirizine (ZYRTEC) 10 MG tablet Take 10 mg by mouth daily.    . clonazePAM (KLONOPIN) 0.5 MG tablet Take 1 tablet (0.5 mg total) by mouth  at bedtime as needed for anxiety.  . dicyclomine (BENTYL) 10 MG  capsule Take 1 capsule (10 mg total) by mouth 3 (three) times daily as needed for spasms.  Marland Kitchen diltiazem (CARDIZEM CD) 240 MG 24 hr capsule TAKE 1 CAPSULE BY MOUTH DAILY  . diltiazem (CARDIZEM) 30 MG tablet Take 1-2 tablets every 6 hours as needed for persistent tachycardai  . escitalopram (LEXAPRO) 20 MG tablet Take 20 mg by mouth daily.    . Fluticasone-Salmeterol (ADVAIR) 250-50 MCG/DOSE AEPB Inhale 1 puff into the lungs 2 (two) times daily.   Marland Kitchen ibuprofen (ADVIL,MOTRIN) 200 MG tablet Take 400 mg by mouth every 6 (six) hours as needed for moderate pain.  Marland Kitchen ipratropium-albuterol (DUONEB) 0.5-2.5 (3) MG/3ML SOLN Take 3 mLs by nebulization 3 (three) times daily. (Patient taking differently: Take 3 mLs by nebulization 3 (three) times daily as needed (for shortness of breath or wheezing). )  . montelukast (SINGULAIR) 10 MG tablet Take 1 tablet (10 mg total) by mouth at bedtime.  . Multiple Vitamin (MULTIVITAMIN WITH MINERALS) TABS tablet Take 1 tablet by mouth daily.  . ondansetron (ZOFRAN) 4 MG tablet Take 1 tablet (4 mg total) every 8 (eight) hours as needed by mouth for nausea or vomiting.  Marland Kitchen oxybutynin (DITROPAN) 5 MG tablet Take 5 mg by mouth daily.   . OXYGEN Inhale 4 L into the lungs continuous.  . roflumilast (DALIRESP) 500 MCG TABS tablet Take 500 mcg by mouth daily.    . sodium chloride (OCEAN) 0.65 % SOLN nasal spray Place 2-3 sprays into both nostrils as needed for congestion.  Marland Kitchen SPIRIVA RESPIMAT 2.5 MCG/ACT AERS Inhale 2 puffs into the lungs daily.   . traMADol (ULTRAM) 50 MG tablet Take 50 mg by mouth every 6 (six) hours as needed for moderate pain.   . traZODone (DESYREL) 50 MG tablet Take 50 mg by mouth at bedtime as needed for sleep.  Marland Kitchen triamcinolone (NASACORT ALLERGY 24HR) 55 MCG/ACT AERO nasal inhaler Place 2 sprays into the nose 2 (two) times daily as needed (for allergies).   . [DISCONTINUED] guaiFENesin (MUCINEX) 600 MG 12 hr tablet Take 600 mg by mouth 2 (two) times daily as  needed for cough or to loosen phlegm.    No facility-administered encounter medications on file as of 05/11/2019.   55 min spent in reviewing records, taking history from pt , exam, assessment and plan Follow-up: pulmonary Fasting labwork Dayyan Krist Mat Carne, MD

## 2019-06-17 ENCOUNTER — Other Ambulatory Visit: Payer: Self-pay

## 2019-06-17 MED ORDER — ATORVASTATIN CALCIUM 40 MG PO TABS: ORAL_TABLET | ORAL | 3 refills | Status: DC

## 2019-06-17 NOTE — Telephone Encounter (Signed)
Refilled Lipitor to mail order per fax request

## 2019-07-15 DIAGNOSIS — Z23 Encounter for immunization: Secondary | ICD-10-CM | POA: Diagnosis not present

## 2019-08-13 ENCOUNTER — Telehealth: Payer: Self-pay

## 2019-08-13 NOTE — Telephone Encounter (Signed)
  Patient Consent for Virtual Visit         [name] has provided verbal consent on 08/13/2019 for a virtual visit (video or telephone).   CONSENT FOR VIRTUAL VISIT FOR:  [name]  By participating in this virtual visit I agree to the following:  I hereby voluntarily request, consent and authorize [facility] and its employed or contracted physicians, physician assistants, nurse practitioners or other licensed health care professionals (the Practitioner), to provide me with telemedicine health care services (the "Services") as deemed necessary by the treating Practitioner. I acknowledge and consent to receive the Services by the Practitioner via telemedicine. I understand that the telemedicine visit will involve communicating with the Practitioner through live audiovisual communication technology and the disclosure of certain medical information by electronic transmission. I acknowledge that I have been given the opportunity to request an in-person assessment or other available alternative prior to the telemedicine visit and am voluntarily participating in the telemedicine visit.  I understand that I have the right to withhold or withdraw my consent to the use of telemedicine in the course of my care at any time, without affecting my right to future care or treatment, and that the Practitioner or I may terminate the telemedicine visit at any time. I understand that I have the right to inspect all information obtained and/or recorded in the course of the telemedicine visit and may receive copies of available information for a reasonable fee.  I understand that some of the potential risks of receiving the Services via telemedicine include:  Marland Kitchen Delay or interruption in medical evaluation due to technological equipment failure or disruption; . Information transmitted may not be sufficient (e.g. poor resolution of images) to allow for appropriate medical decision making by the Practitioner;  and/or  . In rare instances, security protocols could fail, causing a breach of personal health information.  Furthermore, I acknowledge that it is my responsibility to provide information about my medical history, conditions and care that is complete and accurate to the best of my ability. I acknowledge that Practitioner's advice, recommendations, and/or decision may be based on factors not within their control, such as incomplete or inaccurate data provided by me or distortions of diagnostic images or specimens that may result from electronic transmissions. I understand that the practice of medicine is not an exact science and that Practitioner makes no warranties or guarantees regarding treatment outcomes. I acknowledge that a copy of this consent can be made available to me via my patient portal ([facility]), or I can request a printed copy by calling the office of [facility].    I understand that my insurance will be billed for this visit.   I have read or had this consent read to me. . I understand the contents of this consent, which adequately explains the benefits and risks of the Services being provided via telemedicine.  . I have been provided ample opportunity to ask questions regarding this consent and the Services and have had my questions answered to my satisfaction. . I give my informed consent for the services to be provided through the use of telemedicine in my medical care

## 2019-08-14 DIAGNOSIS — Z23 Encounter for immunization: Secondary | ICD-10-CM | POA: Diagnosis not present

## 2019-08-20 DIAGNOSIS — J9611 Chronic respiratory failure with hypoxia: Secondary | ICD-10-CM | POA: Diagnosis not present

## 2019-08-20 DIAGNOSIS — R7303 Prediabetes: Secondary | ICD-10-CM | POA: Diagnosis not present

## 2019-08-20 DIAGNOSIS — I2510 Atherosclerotic heart disease of native coronary artery without angina pectoris: Secondary | ICD-10-CM | POA: Diagnosis not present

## 2019-08-20 DIAGNOSIS — J449 Chronic obstructive pulmonary disease, unspecified: Secondary | ICD-10-CM | POA: Diagnosis not present

## 2019-08-20 DIAGNOSIS — K589 Irritable bowel syndrome without diarrhea: Secondary | ICD-10-CM | POA: Diagnosis not present

## 2019-08-20 DIAGNOSIS — F419 Anxiety disorder, unspecified: Secondary | ICD-10-CM | POA: Diagnosis not present

## 2019-08-20 DIAGNOSIS — G4752 REM sleep behavior disorder: Secondary | ICD-10-CM | POA: Diagnosis not present

## 2019-08-20 DIAGNOSIS — I471 Supraventricular tachycardia: Secondary | ICD-10-CM | POA: Diagnosis not present

## 2019-08-20 DIAGNOSIS — E119 Type 2 diabetes mellitus without complications: Secondary | ICD-10-CM | POA: Diagnosis not present

## 2019-08-20 DIAGNOSIS — Z0001 Encounter for general adult medical examination with abnormal findings: Secondary | ICD-10-CM | POA: Diagnosis not present

## 2019-08-20 DIAGNOSIS — Z79899 Other long term (current) drug therapy: Secondary | ICD-10-CM | POA: Diagnosis not present

## 2019-08-27 DIAGNOSIS — Z6825 Body mass index (BMI) 25.0-25.9, adult: Secondary | ICD-10-CM | POA: Diagnosis not present

## 2019-08-27 DIAGNOSIS — R1013 Epigastric pain: Secondary | ICD-10-CM | POA: Diagnosis not present

## 2019-08-27 DIAGNOSIS — J449 Chronic obstructive pulmonary disease, unspecified: Secondary | ICD-10-CM | POA: Diagnosis not present

## 2019-08-27 DIAGNOSIS — J9611 Chronic respiratory failure with hypoxia: Secondary | ICD-10-CM | POA: Diagnosis not present

## 2019-08-27 DIAGNOSIS — I2510 Atherosclerotic heart disease of native coronary artery without angina pectoris: Secondary | ICD-10-CM | POA: Diagnosis not present

## 2019-09-07 ENCOUNTER — Institutional Professional Consult (permissible substitution): Payer: Medicare Other | Admitting: Internal Medicine

## 2019-09-07 ENCOUNTER — Ambulatory Visit (HOSPITAL_COMMUNITY)
Admission: RE | Admit: 2019-09-07 | Discharge: 2019-09-07 | Disposition: A | Discharge status: Home / Self Care | Payer: Medicare Other | Source: Ambulatory Visit | Attending: Internal Medicine | Admitting: Internal Medicine

## 2019-09-07 ENCOUNTER — Ambulatory Visit (INDEPENDENT_AMBULATORY_CARE_PROVIDER_SITE_OTHER): Payer: Medicare Other | Admitting: Internal Medicine

## 2019-09-07 ENCOUNTER — Other Ambulatory Visit: Payer: Self-pay

## 2019-09-07 ENCOUNTER — Encounter: Payer: Self-pay | Admitting: Internal Medicine

## 2019-09-07 DIAGNOSIS — J449 Chronic obstructive pulmonary disease, unspecified: Secondary | ICD-10-CM

## 2019-09-07 DIAGNOSIS — R911 Solitary pulmonary nodule: Secondary | ICD-10-CM

## 2019-09-07 DIAGNOSIS — J9612 Chronic respiratory failure with hypercapnia: Secondary | ICD-10-CM

## 2019-09-07 DIAGNOSIS — J9611 Chronic respiratory failure with hypoxia: Secondary | ICD-10-CM

## 2019-09-07 DIAGNOSIS — R0602 Shortness of breath: Secondary | ICD-10-CM | POA: Diagnosis not present

## 2019-09-07 MED ORDER — TRELEGY ELLIPTA 100-62.5-25 MCG/INH IN AEPB: 1.0000 | INHALATION_SPRAY | Freq: Every day | RESPIRATORY_TRACT | 0 refills | Status: DC

## 2019-09-07 NOTE — Assessment & Plan Note (Addendum)
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 in terms of symptom/risk and laba/lama/ICS  therefore appropriate rx at this point >>>  Change to trelegy x 6 week trial then return   DDX of  difficult airways management almost all start with A and  include Adherence, Ace Inhibitors, Acid Reflux, Active Sinus Disease, Alpha 1 Antitripsin deficiency, Anxiety masquerading as Airways dz,  ABPA,  Allergy(esp in young), Aspiration (esp in elderly), Adverse effects of meds,  Active smoking or vaping, A bunch of PE's (a small clot burden can't cause this syndrome unless there is already severe underlying pulm or vascular dz with poor reserve) plus two Bs  = Bronchiectasis and Beta blocker use..and one C= CHF   Adherence is always the initial "prime suspect" and is a multilayered concern that requires a "trust but verify" approach in every patient - starting with knowing how to use medications, especially inhalers, correctly, keeping up with refills and understanding the fundamental difference between maintenance and prns vs those medications only taken for a very short course and then stopped and not refilled.  - return with all meds in hand using a trust but verify approach to confirm accurate Medication  Reconciliation The principal here is that until we are certain that the  patients are doing what we've asked, it makes no sense to ask them to do more.  - see device teaching  ? Adverse effects of meds > try off generic advair which may be hurting the upper airway more than it is helping the lower one   ? Asthma component Advised: I spent extra time with pt today reviewing appropriate use of albuterol for prn use on exertion with the following points: 1) saba is for relief of  sob that does not improve by walking a slower pace or resting but rather if the pt does not improve after trying this first. 2) If the pt is convinced, as many are, that saba helps recover from activity faster then it's easy to tell if this is the case by re-challenging : ie stop, take the inhaler, then p 5 minutes try the exact same activity (intensity of workload) that just caused the symptoms and see if they are substantially diminished or not after saba 3) if there is an activity that reproducibly causes the symptoms, try the saba 15 min before the activity on alternate days   If in fact the saba really does help, then fine to continue to use it prn but advised may need to look closer at the maintenance regimen being used to achieve better control of airways disease with exertion.    ? Acid (or non-acid) GERD > always difficult to exclude as up to 75% of pts in some series report no assoc GI/ Heartburn symptoms> rec max (24h)  acid suppression and diet restrictions/ reviewed and instructions given in writing.   ? Alpha One AT def > req records  ? Anxiety/depression> deconditionings > usually at the bottom of this list of usual suspects and note already on psychotropics and may interfere with adherence and also interpretation of response or lack thereof to symptom management which can be quite subjective.   ? chf > nothing to suggest   >>>  f/u in 6 weeks p full trial of trelegy

## 2019-09-07 NOTE — Patient Instructions (Addendum)
Make sure you check your oxygen saturations at highest level of activity to be sure it stays over 90% and adjust upward to maintain this level if needed but remember to turn it back to previous settings when you stop (to conserve your supply).    Try albuterol 15 min before an activity that you know would make you short of breath and see if it makes any difference and if makes none then don't take it after activity unless you can't catch your breath.   Plan A = Automatic = Always=    Trelegy one click each am and stop advair and spiriva   Work on inhaler technique:  relax and gently blow all the way out then take a nice smooth deep breath back in   Hold for up to 5 seconds if you can. Blow out thru nose. Rinse and gargle with water when done- arm and hammer toothpaste    Plan B = Backup (to supplement plan A, not to replace it) Only use your albuterol inhaler as a rescue medication to be used if you can't catch your breath by resting or doing a relaxed purse lip breathing pattern.  - The less you use it, the better it will work when you need it. - Ok to use the inhaler up to 2 puffs  every 4 hours if you must but call for appointment if use goes up over your usual need - Don't leave home without it !!  (think of it like the spare tire for your car)   Plan C = Crisis (instead of Plan B but only if Plan B stops working) - only use your albuterol nebulizer if you first try Plan B and it fails to help > ok to use the nebulizer up to every 4 hours but if start needing it regularly call for immediate appointment  Increase Protonix to Take 30- 60 min before your first and last meals of the day   GERD (REFLUX)  is an extremely common cause of respiratory symptoms just like yours , many times with no obvious heartburn at all.    It can be treated with medication, but also with lifestyle changes including elevation of the head of your bed (ideally with 6 -8inch blocks under the headboard of your bed),   Smoking cessation, avoidance of late meals, excessive alcohol, and avoid fatty foods, chocolate, peppermint, colas, red wine, and acidic juices such as orange juice.  NO MINT OR MENTHOL PRODUCTS SO NO COUGH DROPS  USE SUGARLESS CANDY INSTEAD (Jolley ranchers or Stover's or Life Savers) or even ice chips will also do - the key is to swallow to prevent all throat clearing. NO OIL BASED VITAMINS - use powdered substitutes.  Avoid fish oil when coughing.  Please remember to go to the  x-ray department  for your tests - we will call you with the results when they are available    Please schedule a follow up office visit in 6 weeks, call sooner if needed

## 2019-09-07 NOTE — Assessment & Plan Note (Signed)
HC03  09/22/17 = 32   - as of 09/07/2019  On bipap hs plus 4lpm 24/7   Advised: Make sure you check your oxygen saturations at highest level of activity to be sure it stays over 90% and adjust upward to maintain this level if needed but remember to turn it back to previous settings when you stop (to conserve your supply).    Reminded of lessons learned in PT > daily walking/ paced on 02           Each maintenance medication was reviewed in detail including emphasizing most importantly the difference between maintenance and prns and under what circumstances the prns are to be triggered using an action plan format where appropriate.  Total time for H and P, chart review, counseling, teaching device and generating customized AVS unique to this office visit / charting = 60 min

## 2019-09-07 NOTE — Progress Notes (Addendum)
[name], female    DOB: 10/03/1955,    MRN: [id number]   Brief patient profile:  25 yowf quit smoking 2017 with "allergies" all her life mostly upper airway but did note doe by college and started using inhalers mostly rescue until advair around 2010 with dx of copd and aecopd admitted to [facility]  1920    64 year old Caucasian female with multiple medical problems including O2 dependent COPD who was treated for diverticulitis .  She underwent colonoscopy in January 2019.  She remains with LLQ abdominal pain and blood-tinged mucus frequently if not daily.  She had follow-up CT in February 2019 and she still has marked wall thickening to sigmoid colon.  She is returning for flexible sigmoidoscopy to make sure she does not have colonic neoplasm in this area mimicking diverticulitis. She generally has 3-4 bowel movements per day.  All of her stools are formed.  She remains on dicyclomine 10 mg twice daily which has helped a great deal with the pain.  Now she has mild discomfort and no sharp pain.  She also complains of polyarthralgia since prednisone dose was decreased to 4 mg daily.      Past Medical History:  Diagnosis Date  . Anxiety   . Complication of anesthesia    hard to maintain oxygen levels after her lst surgery:   . COPD (chronic obstructive pulmonary disease) (HCC)    Home oxygen at [facility]  . Depression   . Diverticulosis   . Dyspnea   . Dysrhythmia   . History of cardiac catheterization    No significant CAD 2005  . Hyperlipidemia   . Hypoglycemia   . IBS (irritable bowel syndrome)   . Pneumonia 2015  . Shingles 2012  . Sleep apnea   . SVT (supraventricular tachycardia) (HCC)          Past Surgical History:  Procedure Laterality Date  . BACK SURGERY    . CARDIAC CATHETERIZATION  2004  . COLONOSCOPY  03/22/2011   Procedure: COLONOSCOPY;  Surgeon: Malissa Hippo, MD;  Location: [facility];  Service: Endoscopy;  Laterality: N/A;  .  COLONOSCOPY WITH PROPOFOL N/A 05/16/2017   Procedure: COLONOSCOPY WITH PROPOFOL;  Surgeon: Malissa Hippo, MD;  Location: [facility];  Service: Endoscopy;  Laterality: N/A;  . POLYPECTOMY  05/16/2017   Procedure: POLYPECTOMY;  Surgeon: Malissa Hippo, MD;  Location: [facility];  Service: Endoscopy;;  . SPINAL FUSION          History of Present Illness  09/07/2019  Pulmonary/ 1st office eval/[facility]  Chief Complaint  Patient presents with  . Pulmonary Consult    Referred by Dr. Dorette Grate.  Former pt of Dr Juanetta Gosling. She states her breathing is doing well today.  She rarely uses her rescue inhaler.   Somewhat improved since quit smoking / still on 24h 02 4lpm and room too room is all she does, does not titrate 02  Dyspnea:  Housebound / always let off at door  Cough: dry x few months like a tickle  Sleep: bed is flat lots of pillows / on bipap/4lpm  per [name] / rested in am  SABA use: proair but rarely tries it even pre-exertion     No obvious day to day or daytime variability or assoc excess/ purulent sputum or mucus plugs or hemoptysis or cp or chest tightness, subjective wheeze or overt sinus or hb symptoms.   Sleeping  without nocturnal  or early am exacerbation  of respiratory  c/o's or need for noct saba. Also denies any obvious fluctuation of symptoms with weather or environmental changes or other aggravating or alleviating factors except as outlined above   No unusual exposure hx or h/o childhood pna  or knowledge of premature birth.  Current Allergies, Complete Past Medical History, Past Surgical History, Family History, and Social History were reviewed in [employer] record.  ROS  The following are not active complaints unless bolded Hoarseness, sore throat, dysphagia, dental problems, itching, sneezing,  nasal congestion or discharge of excess mucus or purulent secretions, ear ache,   fever, chills, sweats, unintended wt loss or wt gain,  classically pleuritic or exertional cp,  orthopnea pnd or arm/hand swelling  or leg swelling, presyncope, palpitations, abdominal pain, anorexia, nausea, vomiting, diarrhea  or change in bowel habits or change in bladder habits, change in stools or change in urine, dysuria, hematuria,  rash, arthralgias, visual complaints, headache, numbness, weakness or ataxia or problems with walking or coordination,  change in mood or  memory.              Past Medical History:  Diagnosis Date  . Allergy   . Anxiety   . Arthritis   . COPD (chronic obstructive pulmonary disease) (HCC)    Home oxygen at [facility]  . Depression   . Diverticulosis   . Dyspnea   . Emphysema of lung (HCC)   . History of cardiac catheterization    No significant CAD 2005  . Hyperlipidemia   . Hypoglycemia   . IBS (irritable bowel syndrome)   . Neuromuscular disorder (HCC)   . Osteoporosis   . Pneumonia 2015  . Shingles 2012  . Sleep apnea   . SVT (supraventricular tachycardia) (HCC)     Outpatient Medications Prior to Visit  Medication Sig Dispense Refill  . acyclovir (ZOVIRAX) 800 MG tablet Take 800 mg by mouth 5 (five) times daily as needed (for break outs).     Marland Kitchen albuterol (PROAIR HFA) 108 (90 BASE) MCG/ACT inhaler Inhale 2 puffs into the lungs every 6 (six) hours as needed for wheezing or shortness of breath.     . ALPRAZolam (XANAX) 0.25 MG tablet Take 0.25 mg by mouth 3 (three) times daily as needed for anxiety.     Marland Kitchen aspirin 81 MG tablet Take 1 tablet (81 mg total) by mouth daily. 100 tablet 3  . atorvastatin (LIPITOR) 40 MG tablet TAKE (1) TABLET BY MOUTH ONCE DAILY. 90 tablet 3  . azithromycin (AZASITE) 1 % ophthalmic solution Place 1 drop into both eyes 2 (two) times daily. 2.5 mL 1  . carboxymethylcellulose (REFRESH) 1 % ophthalmic solution Place 2 drops into both eyes 3 (three) times daily as needed (for dry eyes).    . cetirizine (ZYRTEC) 10 MG tablet Take 10 mg by mouth daily.      . clonazePAM  (KLONOPIN) 0.5 MG tablet Take 1 tablet (0.5 mg total) by mouth at bedtime as needed for anxiety. 10 tablet 0  . dicyclomine (BENTYL) 10 MG capsule Take 1 capsule (10 mg total) by mouth 3 (three) times daily as needed for spasms. 270 capsule 3  . diltiazem (CARDIZEM CD) 240 MG 24 hr capsule TAKE 1 CAPSULE BY MOUTH DAILY 90 capsule 3  . diltiazem (CARDIZEM) 30 MG tablet Take 1-2 tablets every 6 hours as needed for persistent tachycardai 90 tablet 3  . escitalopram (LEXAPRO) 20 MG tablet Take 20 mg by mouth daily.      Marland Kitchen  Fluticasone-Salmeterol (ADVAIR) 250-50 MCG/DOSE AEPB Inhale 1 puff into the lungs 2 (two) times daily.     Marland Kitchen ibuprofen (ADVIL,MOTRIN) 200 MG tablet Take 400 mg by mouth every 6 (six) hours as needed for moderate pain.    Marland Kitchen ipratropium-albuterol (DUONEB) 0.5-2.5 (3) MG/3ML SOLN Take 3 mLs by nebulization 3 (three) times daily. (Patient taking differently: Take 3 mLs by nebulization 3 (three) times daily as needed (for shortness of breath or wheezing). ) 360 mL 1  . montelukast (SINGULAIR) 10 MG tablet Take 1 tablet (10 mg total) by mouth at bedtime. 30 tablet 1  . ondansetron (ZOFRAN) 4 MG tablet Take 1 tablet (4 mg total) every 8 (eight) hours as needed by mouth for nausea or vomiting. 30 tablet 0  . oxybutynin (DITROPAN) 5 MG tablet Take 5 mg by mouth daily.     . OXYGEN Inhale 4 L into the lungs continuous.    . roflumilast (DALIRESP) 500 MCG TABS tablet Take 500 mcg by mouth daily.      . sodium chloride (OCEAN) 0.65 % SOLN nasal spray Place 2-3 sprays into both nostrils as needed for congestion.    Marland Kitchen SPIRIVA RESPIMAT 2.5 MCG/ACT AERS Inhale 2 puffs into the lungs daily.     . traMADol (ULTRAM) 50 MG tablet Take 50 mg by mouth every 6 (six) hours as needed for moderate pain.     . traZODone (DESYREL) 50 MG tablet Take 50 mg by mouth at bedtime as needed for sleep.    Marland Kitchen triamcinolone (NASACORT ALLERGY 24HR) 55 MCG/ACT AERO nasal inhaler Place 2 sprays into the nose 2 (two) times  daily as needed (for allergies).     . Multiple Vitamin (MULTIVITAMIN WITH MINERALS) TABS tablet Take 1 tablet by mouth daily.        Objective:     BP 124/72 (BP Location: Left Arm, Cuff Size: Normal)   Pulse 86   Ht 5\' 1"  (1.549 m)   Wt 135 lb (61.2 kg)   SpO2 99% Comment: on 4lpm cont o2  BMI 25.51 kg/m   SpO2: 99 %(on 4lpm cont o2) O2 Type: Continuous O2 O2 Flow Rate (L/min): 4 L/min    HEENT : pt wearing mask not removed for exam due to covid -19 concerns.    NECK :  without JVD/Nodes/TM/ nl carotid upstrokes bilaterally   LUNGS: no acc muscle use,  Mod barrel  contour chest wall with bilateral  Distant bs s audible wheeze and  without cough on insp or exp maneuvers and mod  Hyperresonant  to  percussion bilaterally     CV:  RRR  no s3 or murmur or increase in P2, and no edema   ABD:  soft and nontender with pos mid insp Hoover's  in the supine position. No bruits or organomegaly appreciated, bowel sounds nl  MS:     ext warm without deformities, calf tenderness, cyanosis or clubbing No obvious joint restrictions   SKIN: warm and dry without lesions    NEURO:  alert, approp, nl sensorium with  no motor or cerebellar deficits apparent.        CXR PA and Lateral:   09/07/2019 :    I personally reviewed images and agree with radiology impression as follows:   1. There is a subtle nodular density in the periphery of the right upper lung. While this could be artifactual due to confluence of shadows, a true nodule is not excluded. Recommend CT imaging for better evaluation. 2. Mild  opacity in the left base may represent atelectasis or infiltrate. Recommend short-term follow-up imaging to ensure resolution.      Assessment   COPD Group D symptoms Quit smoking 2017 - pfts on file with Dr [name] in [place] who checked alpha one AT scren > req 09/07/2019  - 09/07/2019  After extensive coaching inhaler device,  effectiveness =    90% with dpi > changed to trelegy    Complicated by hypoxemic and hypercarbic resp failure c/w  Group D in terms of symptom/risk and laba/lama/ICS  therefore appropriate rx at this point >>>  Change to trelegy x 6 week trial then return   DDX of  difficult airways management almost all start with A and  include Adherence, Ace Inhibitors, Acid Reflux, Active Sinus Disease, Alpha 1 Antitripsin deficiency, Anxiety masquerading as Airways dz,  ABPA,  Allergy(esp in young), Aspiration (esp in elderly), Adverse effects of meds,  Active smoking or vaping, A bunch of PE's (a small clot burden can't cause this syndrome unless there is already severe underlying pulm or vascular dz with poor reserve) plus two Bs  = Bronchiectasis and Beta blocker use..and one C= CHF   Adherence is always the initial "prime suspect" and is a multilayered concern that requires a "trust but verify" approach in every patient - starting with knowing how to use medications, especially inhalers, correctly, keeping up with refills and understanding the fundamental difference between maintenance and prns vs those medications only taken for a very short course and then stopped and not refilled.  - return with all meds in hand using a trust but verify approach to confirm accurate Medication  Reconciliation The principal here is that until we are certain that the  patients are doing what we've asked, it makes no sense to ask them to do more.  - see device teaching  ? Adverse effects of meds > try off generic advair which may be hurting the upper airway more than it is helping the lower one   ? Asthma component Advised: I spent extra time with pt today reviewing appropriate use of albuterol for prn use on exertion with the following points: 1) saba is for relief of sob that does not improve by walking a slower pace or resting but rather if the pt does not improve after trying this first. 2) If the pt is convinced, as many are, that saba helps recover from activity faster  then it's easy to tell if this is the case by re-challenging : ie stop, take the inhaler, then p 5 minutes try the exact same activity (intensity of workload) that just caused the symptoms and see if they are substantially diminished or not after saba 3) if there is an activity that reproducibly causes the symptoms, try the saba 15 min before the activity on alternate days   If in fact the saba really does help, then fine to continue to use it prn but advised may need to look closer at the maintenance regimen being used to achieve better control of airways disease with exertion.    ? Acid (or non-acid) GERD > always difficult to exclude as up to 75% of pts in some series report no assoc GI/ Heartburn symptoms> rec max (24h)  acid suppression and diet restrictions/ reviewed and instructions given in writing.   ? Alpha One AT def > req records  ? Anxiety/depression> deconditionings > usually at the bottom of this list of usual suspects and note already on psychotropics and may  interfere with adherence and also interpretation of response or lack thereof to symptom management which can be quite subjective.   ? chf > nothing to suggest   >>> f/u in 6 weeks p full trial of trelegy     Chronic respiratory failure with hypoxia and hypercapnia (HCC) HC03  09/22/17 = 32   - as of 09/07/2019  On bipap hs plus 4lpm 24/7   Advised: Make sure you check your oxygen saturations at highest level of activity to be sure it stays over 90% and adjust upward to maintain this level if needed but remember to turn it back to previous settings when you stop (to conserve your supply).    >>>> Reminded of lessons learned in PT > daily walking/ paced on 02     Solitary pulmonary nodule - See cxr 09/07/2019 > rec CT w/o contrast   I believe I can see this on CT neck from 2018 but best to do apples to apples comparison with new ct now given smoking hx  Discussed in detail all the  indications, usual  risks and  alternatives  relative to the benefits with patient who agrees to proceed with w/u as outlined.   Each maintenance medication was reviewed in detail including emphasizing most importantly the difference between maintenance and prns and under what circumstances the prns are to be triggered using an action plan format where appropriate.  Total time for H and P, chart review, counseling, teaching device and generating customized AVS unique to this office visit / charting = 60 min        Sandrea Hughs, MD 09/07/2019

## 2019-09-08 DIAGNOSIS — R911 Solitary pulmonary nodule: Secondary | ICD-10-CM | POA: Insufficient documentation

## 2019-09-08 NOTE — Assessment & Plan Note (Signed)
Quit smoking 2017  - See cxr 09/07/2019 > rec CT w/o contrast   I believe I can see this on CT neck from 2018 but best to do apples to apples comparison with new ct now given smoking hx  Discussed in detail all the  indications, usual  risks and alternatives  relative to the benefits with patient who agrees to proceed with w/u as outlined.

## 2019-09-09 ENCOUNTER — Telehealth: Payer: Self-pay | Admitting: Internal Medicine

## 2019-09-09 DIAGNOSIS — R918 Other nonspecific abnormal finding of lung field: Secondary | ICD-10-CM

## 2019-09-09 NOTE — Telephone Encounter (Signed)
The ct I ordered covers the upper abdomen well but anything lower requires special prep/contrast that I don't ever order without input from GI (or PCP if they order these regularly).  We can certainly hold off or our part and do GI referral if she doesn't have one yet so we do a single visit for the appropriate ct's all at the same time.

## 2019-09-09 NOTE — Telephone Encounter (Signed)
Tanda Rockers, MD  09/08/2019 9:02 AM EDT    Call pt: I tried to call her but got no answer - there is a very vague density in R upper lobe that I think is just scarring as probably present on a ct from 2018 but best to do CT w/o contrast at [facility] at her convenience prior to next ov  Dx spn    Called and spoke with pt letting her know the results of the cxr and she verbalized understanding. Order has been placed for the CT chest.  While speaking with pt, pt wanted to know if we could also order a CT abdomen as she is still having a lot of abdominal/GERD problems and even though her protonix was increased, that hasn't helped much. Dr. Melvyn Novas, please advise.

## 2019-09-09 NOTE — Telephone Encounter (Signed)
LMTCB x1 for pt.  

## 2019-09-10 NOTE — Telephone Encounter (Signed)
LMTCB x2  

## 2019-09-14 NOTE — Telephone Encounter (Signed)
Called and spoke with patient. We have ordered a CT chest on her already. Right now patient want so to hold off on doing another CT that covers more and seeing a GI doctor. She has a GI doctor already and states that she wants to have CT done that is already ordered and if she needs to after that she will call GI and make an appointment. I will forward this to Dr. Melvyn Novas just as an Juluis Rainier. Nothing further needed at this time.

## 2019-09-16 ENCOUNTER — Encounter: Payer: Self-pay | Admitting: Cardiology

## 2019-09-16 NOTE — Progress Notes (Signed)
Virtual Visit via Telephone Note   This visit type was conducted due to national recommendations for restrictions regarding the COVID-19 Pandemic (e.g. social distancing) in an effort to limit this patient's exposure and mitigate transmission in our community.  Due to her co-morbid illnesses, this patient is at least at moderate risk for complications without adequate follow up.  This format is felt to be most appropriate for this patient at this time.  The patient did not have access to video technology/had technical difficulties with video requiring transitioning to audio format only (telephone).  All issues noted in this document were discussed and addressed.  No physical exam could be performed with this format.  Please refer to the patient's chart for her  consent to telehealth for [facility].   The patient was identified using 2 identifiers.  Date:  09/17/2019   ID:  [name], DOB December 31, 1955, MRN [id number]  Patient Location: Home Provider Location: Home  PCP:  Carylon Perches, MD  Cardiologist:  Nona Dell, MD Electrophysiologist:  None   Evaluation Performed:  Follow-Up Visit  Chief Complaint:   Cardiac follow-up  History of Present Illness:    [name] is a 64 y.o. female last assessed via telehealth encounter in November 2020.  We spoke by phone today.  She does not report any progressive sense of palpitations, has had no syncope.  She remains on Cardizem CD 240 mg daily and has not had to take any short acting tablets.  She has had some recent trouble with IBS symptoms, has lost weight associated with this.  She has typically been following with Dr. Karilyn Cota.  PCP is now Dr. Ouida Sills.  I reviewed her remaining medications which are outlined below.  Past Medical History:  Diagnosis Date  . Allergy   . Anxiety   . Arthritis   . COPD (chronic obstructive pulmonary disease) (HCC)    Home oxygen at [facility]  . Depression   . Diverticulosis   . History of cardiac  catheterization    No significant CAD 2005  . Hyperlipidemia   . Hypoglycemia   . IBS (irritable bowel syndrome)   . Neuromuscular disorder (HCC)   . Osteoporosis   . Pneumonia 2015  . Shingles 2012  . Sleep apnea   . SVT (supraventricular tachycardia) (HCC)    Past Surgical History:  Procedure Laterality Date  . BACK SURGERY    . BREAST SURGERY    . CARDIAC CATHETERIZATION  2004  . COLONOSCOPY  03/22/2011   Procedure: COLONOSCOPY;  Surgeon: Malissa Hippo, MD;  [facility];  Service: Endoscopy;  Laterality: N/A;  . COLONOSCOPY WITH PROPOFOL N/A 05/16/2017   Procedure: COLONOSCOPY WITH PROPOFOL;  Surgeon: Malissa Hippo, MD;  Location[facility];  Service: Endoscopy;  Laterality: N/A;  . FLEXIBLE SIGMOIDOSCOPY N/A 09/26/2017   Procedure: FLEXIBLE SIGMOIDOSCOPY with Propofol ;  Surgeon: Malissa Hippo, MD;  [facility];  Service: Endoscopy;  Laterality: N/A;  9:30  . POLYPECTOMY  05/16/2017   Procedure: POLYPECTOMY;  Surgeon: Malissa Hippo, MD;  [facility];  Service: Endoscopy;;  . SPINAL FUSION       Current Meds  Medication Sig  . acyclovir (ZOVIRAX) 800 MG tablet Take 800 mg by mouth 5 (five) times daily as needed (for break outs).   Marland Kitchen albuterol (PROAIR HFA) 108 (90 BASE) MCG/ACT inhaler Inhale 2 puffs into the lungs every 6 (six) hours as needed for wheezing or shortness  of breath.   . ALPRAZolam (XANAX) 0.25 MG tablet Take 0.25 mg by mouth 3 (three) times daily as needed for anxiety.   Marland Kitchen aspirin 81 MG tablet Take 1 tablet (81 mg total) by mouth daily.  Marland Kitchen atorvastatin (LIPITOR) 40 MG tablet TAKE (1) TABLET BY MOUTH ONCE DAILY.  Marland Kitchen azithromycin (AZASITE) 1 % ophthalmic solution Place 1 drop into both eyes 2 (two) times daily.  . carboxymethylcellulose (REFRESH) 1 % ophthalmic solution Place 2 drops into both eyes 3 (three) times daily as needed (for dry eyes).  . cetirizine (ZYRTEC) 10 MG tablet Take 10 mg by mouth daily.    .  clonazePAM (KLONOPIN) 0.5 MG tablet Take 1 tablet (0.5 mg total) by mouth at bedtime as needed for anxiety.  . dicyclomine (BENTYL) 10 MG capsule Take 1 capsule (10 mg total) by mouth 3 (three) times daily as needed for spasms.  Marland Kitchen diltiazem (CARDIZEM CD) 240 MG 24 hr capsule TAKE 1 CAPSULE BY MOUTH DAILY  . diltiazem (CARDIZEM) 30 MG tablet Take 1-2 tablets every 6 hours as needed for persistent tachycardai  . escitalopram (LEXAPRO) 20 MG tablet Take 20 mg by mouth daily.    . Fluticasone-Umeclidin-Vilant (TRELEGY ELLIPTA) 100-62.5-25 MCG/INH AEPB Inhale 1 puff into the lungs daily.  Marland Kitchen ibuprofen (ADVIL,MOTRIN) 200 MG tablet Take 400 mg by mouth every 6 (six) hours as needed for moderate pain.  . montelukast (SINGULAIR) 10 MG tablet Take 1 tablet (10 mg total) by mouth at bedtime.  . Multiple Vitamin (MULTIVITAMIN WITH MINERALS) TABS tablet Take 1 tablet by mouth daily.  . ondansetron (ZOFRAN) 4 MG tablet Take 1 tablet (4 mg total) every 8 (eight) hours as needed by mouth for nausea or vomiting.  Marland Kitchen oxybutynin (DITROPAN) 5 MG tablet Take 5 mg by mouth daily.   . OXYGEN Inhale 4 L into the lungs continuous.  . roflumilast (DALIRESP) 500 MCG TABS tablet Take 500 mcg by mouth daily.    . sodium chloride (OCEAN) 0.65 % SOLN nasal spray Place 2-3 sprays into both nostrils as needed for congestion.  . traMADol (ULTRAM) 50 MG tablet Take 50 mg by mouth every 6 (six) hours as needed for moderate pain.   . traZODone (DESYREL) 50 MG tablet Take 50 mg by mouth at bedtime as needed for sleep.  Marland Kitchen triamcinolone (NASACORT ALLERGY 24HR) 55 MCG/ACT AERO nasal inhaler Place 2 sprays into the nose 2 (two) times daily as needed (for allergies).      Allergies:   Iohexol, Neosporin [neomycin-bacitracin zn-polymyx], Adhesive [tape], Chantix [varenicline], Ketek [telithromycin], and Penicillins   ROS:   Trouble with IBS symptoms.  Prior CV studies:   The following studies were reviewed  today:  Echocardiogram2/20/2017: Study Conclusions  - Left ventricle: The cavity size was normal. Wall thickness was normal. Systolic function was normal. The estimated ejection fraction was in the range of 60% to 65%. Wall motion was normal; there were no regional wall motion abnormalities. Doppler parameters are consistent with abnormal left ventricular relaxation (grade 1 diastolic dysfunction). - Left atrium: The atrium was mildly dilated.  Labs/Other Tests and Data Reviewed:    EKG:  An ECG dated 06/30/2018 was personally reviewed today and demonstrated:  Sinus rhythm with nonspecific ST-T changes.  Recent Labs:  No interval lab work for review today.  Wt Readings from Last 3 Encounters:  09/17/19 135 lb (61.2 kg)  09/07/19 135 lb (61.2 kg)  03/26/19 135 lb (61.2 kg)     Objective:  Vital Signs:  BP 133/73   [name] 76   Temp 98.7 F (37.1 C)   Ht 5\' 1"  (1.549 m)   Wt 135 lb (61.2 kg)   SpO2 99%   BMI 25.51 kg/m    Patient spoke in full sentences, not short of breath. No audible wheezing or coughing.  ASSESSMENT & PLAN:    1.  PSVT.  No progressive sense of palpitations, plan to continue Cardizem CD 240 mg daily with as needed short acting Cardizem.  2.  Severe, oxygen dependent COPD.  She is now following with Dr. Sherene Sires.   Time:   Today, I have spent 6 minutes with the patient with telehealth technology discussing the above problems.     Medication Adjustments/Labs and Tests Ordered: Current medicines are reviewed at length with the patient today.  Concerns regarding medicines are outlined above.   Tests Ordered: No orders of the defined types were placed in this encounter.   Medication Changes: No orders of the defined types were placed in this encounter.   Follow Up:  In Person 6 months in the [place] office.  Signed, Nona Dell, MD  09/17/2019 11:13 AM    [facility]

## 2019-09-17 ENCOUNTER — Encounter: Payer: Self-pay | Admitting: Cardiology

## 2019-09-17 ENCOUNTER — Telehealth (INDEPENDENT_AMBULATORY_CARE_PROVIDER_SITE_OTHER): Payer: Medicare Other | Admitting: Cardiology

## 2019-09-17 VITALS — BP 133/73 | HR 76 | Temp 98.7°F | Ht 61.0 in | Wt 135.0 lb

## 2019-09-17 DIAGNOSIS — J449 Chronic obstructive pulmonary disease, unspecified: Secondary | ICD-10-CM | POA: Diagnosis not present

## 2019-09-17 DIAGNOSIS — I471 Supraventricular tachycardia: Secondary | ICD-10-CM

## 2019-09-17 NOTE — Patient Instructions (Signed)
Medication Instructions:  Your physician recommends that you continue on your current medications as directed. Please refer to the Current Medication list given to you today.  *If you need a refill on your cardiac medications before your next appointment, please call your pharmacy*   Lab Work: None today If you have labs (blood work) drawn today and your tests are completely normal, you will receive your results only by: . MyChart Message (if you have MyChart) OR . A paper copy in the mail If you have any lab test that is abnormal or we need to change your treatment, we will call you to review the results.   Testing/Procedures: None today   Follow-Up: At [facility], you and your health needs are our priority.  As part of our continuing mission to provide you with exceptional heart care, we have created designated Provider Care Teams.  These Care Teams include your primary Cardiologist (physician) and Advanced Practice Providers (APPs -  Physician Assistants and Nurse Practitioners) who all work together to provide you with the care you need, when you need it.  We recommend signing up for the patient portal called "MyChart".  Sign up information is provided on this After Visit Summary.  MyChart is used to connect with patients for Virtual Visits (Telemedicine).  Patients are able to view lab/test results, encounter notes, upcoming appointments, etc.  Non-urgent messages can be sent to your provider as well.   To learn more about what you can do with MyChart, go to [email or web address].    Your next appointment:   6 month(s)  The format for your next appointment:   In Person  Provider:   Samuel McDowell, MD or Brittany Strader, PA-C   Other Instructions None      Thank you for choosing [facility] !         

## 2019-09-27 ENCOUNTER — Other Ambulatory Visit: Payer: Self-pay | Admitting: *Deleted

## 2019-09-27 MED ORDER — DILTIAZEM HCL ER COATED BEADS 240 MG PO CP24: 240.0000 mg | ORAL_CAPSULE | Freq: Every day | ORAL | 3 refills | Status: DC

## 2019-09-28 ENCOUNTER — Other Ambulatory Visit (HOSPITAL_COMMUNITY): Payer: Self-pay | Admitting: Internal Medicine

## 2019-09-28 ENCOUNTER — Other Ambulatory Visit: Payer: Self-pay | Admitting: Internal Medicine

## 2019-09-28 DIAGNOSIS — R1032 Left lower quadrant pain: Secondary | ICD-10-CM

## 2019-09-28 DIAGNOSIS — J449 Chronic obstructive pulmonary disease, unspecified: Secondary | ICD-10-CM | POA: Diagnosis not present

## 2019-10-06 ENCOUNTER — Telehealth: Payer: Self-pay | Admitting: Internal Medicine

## 2019-10-06 MED ORDER — TRELEGY ELLIPTA 100-62.5-25 MCG/INH IN AEPB: 1.0000 | INHALATION_SPRAY | Freq: Every day | RESPIRATORY_TRACT | 3 refills | Status: DC

## 2019-10-06 NOTE — Telephone Encounter (Signed)
Spoke with the pt to verify pharm  Rx was sent for trelegy to [facility]

## 2019-10-15 ENCOUNTER — Ambulatory Visit (HOSPITAL_COMMUNITY)
Admission: RE | Admit: 2019-10-15 | Discharge: 2019-10-15 | Disposition: A | Discharge status: Home / Self Care | Payer: Medicare Other | Source: Ambulatory Visit | Attending: Internal Medicine | Admitting: Internal Medicine

## 2019-10-15 ENCOUNTER — Other Ambulatory Visit: Payer: Self-pay

## 2019-10-15 ENCOUNTER — Other Ambulatory Visit (HOSPITAL_COMMUNITY): Payer: Self-pay | Admitting: Internal Medicine

## 2019-10-15 DIAGNOSIS — R1032 Left lower quadrant pain: Secondary | ICD-10-CM

## 2019-10-15 DIAGNOSIS — K5730 Diverticulosis of large intestine without perforation or abscess without bleeding: Secondary | ICD-10-CM | POA: Diagnosis not present

## 2019-10-15 DIAGNOSIS — K8020 Calculus of gallbladder without cholecystitis without obstruction: Secondary | ICD-10-CM | POA: Diagnosis not present

## 2019-10-15 DIAGNOSIS — J439 Emphysema, unspecified: Secondary | ICD-10-CM | POA: Diagnosis not present

## 2019-10-15 DIAGNOSIS — R918 Other nonspecific abnormal finding of lung field: Secondary | ICD-10-CM | POA: Insufficient documentation

## 2019-10-15 LAB — POCT I-STAT CREATININE: Creatinine, Ser: 0.8 mg/dL (ref 0.44–1.00)

## 2019-10-18 NOTE — Progress Notes (Signed)
Spoke with pt and notified of results per Dr. Wert. Pt verbalized understanding and denied any questions. 

## 2019-10-19 ENCOUNTER — Encounter: Payer: Self-pay | Admitting: Internal Medicine

## 2019-10-19 ENCOUNTER — Ambulatory Visit (INDEPENDENT_AMBULATORY_CARE_PROVIDER_SITE_OTHER): Payer: Medicare Other | Admitting: Internal Medicine

## 2019-10-19 ENCOUNTER — Other Ambulatory Visit: Payer: Self-pay

## 2019-10-19 DIAGNOSIS — J9612 Chronic respiratory failure with hypercapnia: Secondary | ICD-10-CM

## 2019-10-19 DIAGNOSIS — R911 Solitary pulmonary nodule: Secondary | ICD-10-CM

## 2019-10-19 DIAGNOSIS — J9611 Chronic respiratory failure with hypoxia: Secondary | ICD-10-CM | POA: Diagnosis not present

## 2019-10-19 DIAGNOSIS — J449 Chronic obstructive pulmonary disease, unspecified: Secondary | ICD-10-CM | POA: Diagnosis not present

## 2019-10-19 DIAGNOSIS — K5732 Diverticulitis of large intestine without perforation or abscess without bleeding: Secondary | ICD-10-CM

## 2019-10-19 NOTE — Progress Notes (Signed)
[name], female    DOB: Aug 21, 1955,    MRN: [id number]   Brief patient profile:  64 yowf quit smoking 2017 with "allergies" all her life mostly upper airway but did note doe by college and started using inhalers mostly rescue until advair around 2010 with dx of copd and aecopd admitted to [facility]  [id number]    64 year old Caucasian female with multiple medical problems including O2 dependent COPD who was treated for diverticulitis .  She underwent colonoscopy in January 2019.  She remains with LLQ abdominal pain and blood-tinged mucus frequently if not daily.  She had follow-up CT in February 2019 and she still has marked wall thickening to sigmoid colon.  She is returning for flexible sigmoidoscopy to make sure she does not have colonic neoplasm in this area mimicking diverticulitis. She generally has 3-4 bowel movements per day.  All of her stools are formed.  She remains on dicyclomine 10 mg twice daily which has helped a great deal with the pain.  Now she has mild discomfort and no sharp pain.  She also complains of polyarthralgia since prednisone dose was decreased to 4 mg daily.      Past Medical History:  Diagnosis Date  . Anxiety   . Complication of anesthesia    hard to maintain oxygen levels after her lst surgery:   . COPD (chronic obstructive pulmonary disease) (HCC)    Home oxygen at [facility]  . Depression   . Diverticulosis   . Dyspnea   . Dysrhythmia   . History of cardiac catheterization    No significant CAD 2005  . Hyperlipidemia   . Hypoglycemia   . IBS (irritable bowel syndrome)   . Pneumonia 2015  . Shingles 2012  . Sleep apnea   . SVT (supraventricular tachycardia) (HCC)          Past Surgical History:  Procedure Laterality Date  . BACK SURGERY    . CARDIAC CATHETERIZATION  2004  . COLONOSCOPY  03/22/2011   Procedure: COLONOSCOPY;  Surgeon: Malissa Hippo, MD;  [facility];  Service: Endoscopy;  Laterality: N/A;  .  COLONOSCOPY WITH PROPOFOL N/A 05/16/2017   Procedure: COLONOSCOPY WITH PROPOFOL;  Surgeon: Malissa Hippo, MD;  [facility];  Service: Endoscopy;  Laterality: N/A;  . POLYPECTOMY  05/16/2017   Procedure: POLYPECTOMY;  Surgeon: Malissa Hippo, MD;  [facility];  Service: Endoscopy;;  . SPINAL FUSION          History of Present Illness  09/07/2019  Pulmonary/ 1st office eval/[name]  Chief Complaint  Patient presents with  . Pulmonary Consult    Referred by Dr. Dorette Grate.  Former pt of Dr Juanetta Gosling. She states her breathing is doing well today.  She rarely uses her rescue inhaler.   Somewhat improved since quit smoking / still on 24h 02 4lpm and room too room is all she does, does not titrate 02  Dyspnea:  Housebound / always let off at door  Cough: dry x few months like a tickle  Sleep: bed is flat lots of pillows / on bipap/4lpm  per [name] / rested in am  SABA use: proair but rarely tries it even pre-exertion  rec Make sure you check your oxygen saturations at highest level of activity to be sure it stays over 90% and adjust upward to maintain this level if needed but remember to turn it back to previous settings when you stop (to conserve your supply).  Try albuterol 15 min before an activity that you know would make you short of breath and see if it makes any difference and if makes none then don't take it after activity unless you can't catch your breath. Plan A = Automatic = Always=  Trelegy one click each am and stop advair and spiriva  Work on inhaler technique:  Plan B = Backup (to supplement plan A, not to replace it) Only use your albuterol inhaler as a rescue medication  Plan C = Crisis (instead of Plan B but only if Plan B stops working) - only use yur albuterol nebulizer if you first try Plan B and it fails to help > ok to use the nebulizer up to every 4 hours but if start needing it regularly call for immediate appointment Increase Protonix to  Take 30- 60 min before your first and last meals of the day  GERD diet      10/19/2019  f/u ov/[name] re:  Copd gold D / maint on trelegy daily  Chief Complaint  Patient presents with  . Follow-up    "breathing is best it's been in years". She has not needed her albuterol inhaler at all since the last visit.   Dyspnea:  100 ft so ok inside house  Cooking ok  But not housework  Cough: none lately , some nasal drainage last few days but clear   Sleeping: occ clonezepam hs twice a month/ bipap every noct 45 degrees   SABA use: none 02: 4lpm 24/7 sats running mid 90s    No obvious day to day or daytime variability or assoc excess/ purulent sputum or mucus plugs or hemoptysis or cp or chest tightness, subjective wheeze or overt sinus or hb symptoms.   Sleeping as above  without nocturnal  or early am exacerbation  of respiratory  c/o's or need for noct saba. Also denies any obvious fluctuation of symptoms with weather or environmental changes or other aggravating or alleviating factors except as outlined above   No unusual exposure hx or h/o childhood pna/ asthma or knowledge of premature birth.  Current Allergies, Complete Past Medical History, Past Surgical History, Family History, and Social History were reviewed in [employer] record.  ROS  The following are not active complaints unless bolded Hoarseness, sore throat, dysphagia, dental problems, itching, sneezing,  nasal congestion or discharge of excess mucus or purulent secretions, ear ache,   fever, chills, sweats, unintended wt loss or wt gain, classically pleuritic or exertional cp,  orthopnea pnd or arm/hand swelling  or leg swelling, presyncope, palpitations, abdominal pain, anorexia, nausea, vomiting, diarrhea  or change in bowel habits or change in bladder habits, change in stools or change in urine, dysuria, hematuria,  rash, arthralgias, visual complaints, headache, numbness, weakness or ataxia or problems  with walking or coordination,  change in mood or  memory.        Current Meds  Medication Sig  . acyclovir (ZOVIRAX) 800 MG tablet Take 800 mg by mouth 5 (five) times daily as needed (for break outs).   Marland Kitchen albuterol (PROAIR HFA) 108 (90 BASE) MCG/ACT inhaler Inhale 2 puffs into the lungs every 6 (six) hours as needed for wheezing or shortness of breath.   . ALPRAZolam (XANAX) 0.25 MG tablet Take 0.25 mg by mouth 3 (three) times daily as needed for anxiety.   Marland Kitchen aspirin 81 MG tablet Take 1 tablet (81 mg total) by mouth daily.  Marland Kitchen atorvastatin (LIPITOR) 40 MG tablet TAKE (  1) TABLET BY MOUTH ONCE DAILY.  . carboxymethylcellulose (REFRESH) 1 % ophthalmic solution Place 2 drops into both eyes 3 (three) times daily as needed (for dry eyes).  . cetirizine (ZYRTEC) 10 MG tablet Take 10 mg by mouth daily.    . clonazePAM (KLONOPIN) 0.5 MG tablet Take 1 tablet (0.5 mg total) by mouth at bedtime as needed for anxiety.  . dicyclomine (BENTYL) 10 MG capsule Take 1 capsule (10 mg total) by mouth 3 (three) times daily as needed for spasms.  Marland Kitchen diltiazem (CARDIZEM CD) 240 MG 24 hr capsule Take 1 capsule (240 mg total) by mouth daily.  Marland Kitchen diltiazem (CARDIZEM) 30 MG tablet Take 1-2 tablets every 6 hours as needed for persistent tachycardai  . escitalopram (LEXAPRO) 20 MG tablet Take 20 mg by mouth daily.    . Fluticasone-Umeclidin-Vilant (TRELEGY ELLIPTA) 100-62.5-25 MCG/INH AEPB Inhale 1 puff into the lungs daily.  Marland Kitchen ibuprofen (ADVIL,MOTRIN) 200 MG tablet Take 400 mg by mouth every 6 (six) hours as needed for moderate pain.  . montelukast (SINGULAIR) 10 MG tablet Take 1 tablet (10 mg total) by mouth at bedtime.  . ondansetron (ZOFRAN) 4 MG tablet Take 1 tablet (4 mg total) every 8 (eight) hours as needed by mouth for nausea or vomiting.  Marland Kitchen oxybutynin (DITROPAN) 5 MG tablet Take 5 mg by mouth daily.   . OXYGEN Inhale 4 L into the lungs continuous.  . roflumilast (DALIRESP) 500 MCG TABS tablet Take 500 mcg by mouth  daily.    . sodium chloride (OCEAN) 0.65 % SOLN nasal spray Place 2-3 sprays into both nostrils as needed for congestion.  . traMADol (ULTRAM) 50 MG tablet Take 50 mg by mouth every 6 (six) hours as needed for moderate pain.   . traZODone (DESYREL) 50 MG tablet Take 50 mg by mouth at bedtime as needed for sleep.  Marland Kitchen triamcinolone (NASACORT ALLERGY 24HR) 55 MCG/ACT AERO nasal inhaler Place 2 sprays into the nose 2 (two) times daily as needed (for allergies).                Past Medical History:  Diagnosis Date  . Allergy   . Anxiety   . Arthritis   . COPD (chronic obstructive pulmonary disease) (HCC)    [facility] oxygen at [facility]  . Depression   . Diverticulosis   . Dyspnea   . Emphysema of lung (HCC)   . History of cardiac catheterization    No significant CAD 2005  . Hyperlipidemia   . Hypoglycemia   . IBS (irritable bowel syndrome)   . Neuromuscular disorder (HCC)   . Osteoporosis   . Pneumonia 2015  . Shingles 2012  . Sleep apnea   . SVT (supraventricular tachycardia) (HCC)          Objective:    amb wf using rollator  Wt Readings from Last 3 Encounters:  10/19/19 131 lb (59.4 kg)  09/17/19 135 lb (61.2 kg)  09/07/19 135 lb (61.2 kg)     Vital signs reviewed - Note on arrival 02 sats  100% on 4lpm       HEENT : pt wearing mask not removed for exam due to covid -19 concerns.    NECK :  without JVD/Nodes/TM/ nl carotid upstrokes bilaterally   LUNGS: no acc muscle use,  Mod barrel  contour chest wall with bilateral  Distant bs s audible wheeze and  without cough on insp or exp maneuvers and mod  Hyperresonant  to  percussion bilaterally  CV:  RRR  no s3 or murmur or increase in P2, and no edema   ABD:  soft and nontender with pos mid insp Hoover's  in the supine position. No bruits or organomegaly appreciated, bowel sounds nl  MS:     ext warm without deformities, calf tenderness, cyanosis or clubbing No obvious joint restrictions   SKIN: warm and dry  without lesions    NEURO:  alert, approp, nl sensorium with  no motor or cerebellar deficits apparent.         I personally reviewed images and agree with radiology impression as follows:   Chest CT w/o contrast 10/15/19 1. Sigmoid diverticulosis with findings suspicious for mild uncomplicated diverticulitis. There is a somewhat masslike appearance of the sigmoid colon which may be secondary to the adjacent left ovary, however this is suboptimally evaluated in the absence of IV contrast. Age-appropriate colorectal carcinoma screening is recommended. 2. There is a large amount of stool in the colon. 3. Cholelithiasis without acute inflammation.  Lung portion: There are moderate severe emphysematous changes. There is a stable area of scarring in the left upper lobe. There is a stable calcified granuloma in the right upper lobe. Additional small scattered granulomas are noted in the right middle and right lower lobes. There is atelectasis and scarring at the left lung base with a few calcified granulomas.      Assessment

## 2019-10-19 NOTE — Assessment & Plan Note (Signed)
Quit smoking 2017 - pfts on file with Dr Felton Clinton in [place] who checked alpha one AT scren > req 09/07/2019 : no record of this per Dr Polly Cobia office - 09/07/2019  After extensive coaching inhaler device,  effectiveness =    90% with dpi > changed to trelegy  - CT chest 10/15/19  Emphysema and scarring only    Group D in terms of symptom/risk and laba/lama/ICS  therefore appropriate rx at this point >>>  Continue trelegy / f/u with pfts and alpha one screening

## 2019-10-19 NOTE — Assessment & Plan Note (Signed)
HC03  09/22/17 = 32   - as of 09/07/2019  On bipap hs plus 4lpm 24/7  - CT chest 10/15/19  Emphysema and scarring only   Advised target sats low 90s' in this setting so make sure you check your oxygen saturations at highest level of activity to be sure it stays over 90% and adjust upward to maintain this level if needed but remember to turn it back to previous settings when you stop (to conserve your supply).

## 2019-10-19 NOTE — Assessment & Plan Note (Signed)
Quit smoking 2017  - Ct chest 10/15/19  There are moderate severe emphysematous changes. There is a stable area of scarring in the left upper lobe. There is a stable calcified granuloma in the right upper lobe. Additional small scattered granulomas are noted in the right middle and right lower Lobes.  Given severity of copd she is a poor candidate for LDSCT going forward based on present guidelines and functional capacity

## 2019-10-19 NOTE — Assessment & Plan Note (Addendum)
CT abd 10/15/19 10/15/19 1. Sigmoid diverticulosis with findings suspicious for mild uncomplicated diverticulitis. There is a somewhat masslike appearance of the sigmoid colon which may be secondary to the adjacent left ovary, however this is suboptimally evaluated in the absence of IV contrast. Age-appropriate colorectal carcinoma screening is recommended. 2. There is a large amount of stool in the colon > referred to Dr Melony Overly  Note on LAMA which may need to be reconsidered if chronic constipation not easily addressed by GI.   Medical decision making was a moderate level of complexity in this case because of  two chronic conditions /diagnoses requiring extra time for  H and P, chart review, counseling,   and generating customized AVS unique to this office visit and charting.   Each maintenance medication was reviewed in detail including emphasizing most importantly the difference between maintenance and prns and under what circumstances the prns are to be triggered using an action plan format where appropriate. Please see avs for details which were reviewed in writing by both me and my nurse and patient given a written copy highlighted where appropriate with yellow highlighter for the patient's continued care at home along with an updated version of their medications.  Patient was asked to maintain medication reconciliation by comparing this list to the actual medications being used at home and to contact this office right away if there is a conflict or discrepancy.

## 2019-10-19 NOTE — Patient Instructions (Addendum)
No change in medications    I will call Dr Rosina Lowenstein office re your colon findings    Please schedule a follow up visit in 6 months but call sooner if needed  - late add : pfts on return / also alpha one AT [place]

## 2019-10-20 ENCOUNTER — Telehealth: Payer: Self-pay | Admitting: *Deleted

## 2019-10-20 NOTE — Telephone Encounter (Signed)
-----   Message from Tanda Rockers, MD sent at 10/19/2019  2:45 PM EDT ----- I got an immediate busy signal but be sure [facility] [place] lets Dr Dorien Chihuahua know to look at he CT abd I sent him and document who you talk to there

## 2019-10-20 NOTE — Telephone Encounter (Signed)
Spoke with Mitsy at Dr Olevia Perches office and notified Dr Melvyn Novas would like Dr Laural Golden to review ct abd  I have faxed this to his attn at [phone] and received fax confirmation

## 2019-10-22 ENCOUNTER — Telehealth (INDEPENDENT_AMBULATORY_CARE_PROVIDER_SITE_OTHER): Payer: Self-pay | Admitting: *Deleted

## 2019-10-22 NOTE — Telephone Encounter (Signed)
Patient called to see how she was feeling. [name] states that she may be a tad better. This has been ongoing for about 6 weeks, and has never resolved its self.   Abdominal Pain is diffuse across abdomen and on the left side. She feels that there is something preventing her form passing gas and stool. Feels that she is just full of gas. She has seen a little blood but not like before. Her gas and stool smell horrible. She also states that her stools do not float in the toilet , the toilet water is just brown.  Patient was given Cipro and Flagyl on Monday , June 14.  Dr.Rehman was given this information. He ask that the patient take 2 weeks of the above antibiotics. She is to call our office with progress report next Thursday,June 24.If patient is no better he will see her in the office Thursday afternoon.  Giulianna was called and made aware.

## 2019-10-28 ENCOUNTER — Ambulatory Visit: Payer: Medicare Other | Admitting: Family Medicine

## 2019-11-01 ENCOUNTER — Other Ambulatory Visit: Payer: Self-pay

## 2019-11-01 ENCOUNTER — Emergency Department (HOSPITAL_COMMUNITY): Payer: Medicare Other

## 2019-11-01 ENCOUNTER — Emergency Department (HOSPITAL_COMMUNITY)
Admission: EM | Admit: 2019-11-01 | Discharge: 2019-11-01 | Disposition: A | Discharge status: Home / Self Care | Payer: Medicare Other | Attending: Emergency Medicine | Admitting: Emergency Medicine

## 2019-11-01 ENCOUNTER — Encounter (HOSPITAL_COMMUNITY): Payer: Self-pay | Admitting: *Deleted

## 2019-11-01 DIAGNOSIS — J189 Pneumonia, unspecified organism: Secondary | ICD-10-CM

## 2019-11-01 DIAGNOSIS — J181 Lobar pneumonia, unspecified organism: Secondary | ICD-10-CM | POA: Diagnosis not present

## 2019-11-01 DIAGNOSIS — J90 Pleural effusion, not elsewhere classified: Secondary | ICD-10-CM | POA: Diagnosis not present

## 2019-11-01 DIAGNOSIS — Z7982 Long term (current) use of aspirin: Secondary | ICD-10-CM | POA: Diagnosis not present

## 2019-11-01 DIAGNOSIS — Z87891 Personal history of nicotine dependence: Secondary | ICD-10-CM | POA: Insufficient documentation

## 2019-11-01 DIAGNOSIS — Z79899 Other long term (current) drug therapy: Secondary | ICD-10-CM | POA: Diagnosis not present

## 2019-11-01 DIAGNOSIS — J029 Acute pharyngitis, unspecified: Secondary | ICD-10-CM | POA: Diagnosis not present

## 2019-11-01 DIAGNOSIS — R1084 Generalized abdominal pain: Secondary | ICD-10-CM | POA: Diagnosis present

## 2019-11-01 DIAGNOSIS — J449 Chronic obstructive pulmonary disease, unspecified: Secondary | ICD-10-CM | POA: Insufficient documentation

## 2019-11-01 DIAGNOSIS — R911 Solitary pulmonary nodule: Secondary | ICD-10-CM | POA: Diagnosis not present

## 2019-11-01 DIAGNOSIS — J439 Emphysema, unspecified: Secondary | ICD-10-CM | POA: Diagnosis not present

## 2019-11-01 LAB — CBC WITH DIFFERENTIAL/PLATELET
Abs Immature Granulocytes: 0.06 10*3/uL (ref 0.00–0.07)
Basophils Absolute: 0 10*3/uL (ref 0.0–0.1)
Basophils Relative: 0 %
Eosinophils Absolute: 0.6 10*3/uL — ABNORMAL HIGH (ref 0.0–0.5)
Eosinophils Relative: 4 %
HCT: 39 % (ref 36.0–46.0)
Hemoglobin: 12.3 g/dL (ref 12.0–15.0)
Immature Granulocytes: 0 %
Lymphocytes Relative: 11 %
Lymphs Abs: 1.7 10*3/uL (ref 0.7–4.0)
MCH: 30.4 pg (ref 26.0–34.0)
MCHC: 31.5 g/dL (ref 30.0–36.0)
MCV: 96.5 fL (ref 80.0–100.0)
Monocytes Absolute: 1.1 10*3/uL — ABNORMAL HIGH (ref 0.1–1.0)
Monocytes Relative: 7 %
Neutro Abs: 12.4 10*3/uL — ABNORMAL HIGH (ref 1.7–7.7)
Neutrophils Relative %: 78 %
Platelets: 354 10*3/uL (ref 150–400)
RBC: 4.04 MIL/uL (ref 3.87–5.11)
RDW: 12.1 % (ref 11.5–15.5)
WBC: 15.9 10*3/uL — ABNORMAL HIGH (ref 4.0–10.5)
nRBC: 0 % (ref 0.0–0.2)

## 2019-11-01 LAB — COMPREHENSIVE METABOLIC PANEL
ALT: 14 U/L (ref 0–44)
AST: 14 U/L — ABNORMAL LOW (ref 15–41)
Albumin: 4.1 g/dL (ref 3.5–5.0)
Alkaline Phosphatase: 119 U/L (ref 38–126)
Anion gap: 11 (ref 5–15)
BUN: 9 mg/dL (ref 8–23)
CO2: 31 mmol/L (ref 22–32)
Calcium: 9 mg/dL (ref 8.9–10.3)
Chloride: 97 mmol/L — ABNORMAL LOW (ref 98–111)
Creatinine, Ser: 0.68 mg/dL (ref 0.44–1.00)
GFR calc Af Amer: >60 mL/min (ref >60)
GFR calc non Af Amer: >60 mL/min (ref >60)
Glucose, Bld: 105 mg/dL — ABNORMAL HIGH (ref 70–99)
Potassium: 3.8 mmol/L (ref 3.5–5.1)
Sodium: 139 mmol/L (ref 135–145)
Total Bilirubin: 0.6 mg/dL (ref 0.3–1.2)
Total Protein: 7.9 g/dL (ref 6.5–8.1)

## 2019-11-01 LAB — URINALYSIS, ROUTINE W REFLEX MICROSCOPIC
Bacteria, UA: NONE SEEN
Bilirubin Urine: NEGATIVE
Glucose, UA: NEGATIVE mg/dL
Ketones, ur: NEGATIVE mg/dL
Leukocytes,Ua: NEGATIVE
Nitrite: NEGATIVE
Protein, ur: NEGATIVE mg/dL
Specific Gravity, Urine: 1.003 — ABNORMAL LOW (ref 1.005–1.030)
pH: 6 (ref 5.0–8.0)

## 2019-11-01 LAB — LIPASE, BLOOD: Lipase: 27 U/L (ref 11–51)

## 2019-11-01 MED ORDER — SODIUM CHLORIDE 0.9 % IV BOLUS
1000.0000 mL | Freq: Once | INTRAVENOUS | Status: AC
  Administered 2019-11-01: 1000 mL via INTRAVENOUS

## 2019-11-01 MED ORDER — HYDROCODONE-ACETAMINOPHEN 5-325 MG PO TABS: 2.0000 | ORAL_TABLET | ORAL | 0 refills | Status: AC | PRN

## 2019-11-01 MED ORDER — METHOCARBAMOL 500 MG PO TABS
500.0000 mg | ORAL_TABLET | Freq: Once | ORAL | Status: AC
  Administered 2019-11-01: 500 mg via ORAL
  Filled 2019-11-01: qty 1

## 2019-11-01 MED ORDER — METHOCARBAMOL 500 MG PO TABS
500.0000 mg | ORAL_TABLET | Freq: Two times a day (BID) | ORAL | 0 refills | Status: DC
Start: 2019-11-01 — End: 2021-08-06

## 2019-11-01 MED ORDER — DOXYCYCLINE HYCLATE 100 MG PO CAPS
100.0000 mg | ORAL_CAPSULE | Freq: Two times a day (BID) | ORAL | 0 refills | Status: AC
Start: 2019-11-01 — End: 2019-11-06

## 2019-11-01 MED ORDER — MORPHINE SULFATE (PF) 2 MG/ML IV SOLN
2.0000 mg | Freq: Once | INTRAVENOUS | Status: AC
  Administered 2019-11-01: 2 mg via INTRAVENOUS
  Filled 2019-11-01: qty 1

## 2019-11-01 NOTE — ED Notes (Signed)
Patient tolerated po well, ambulated in hall on 4L  per home regimen. O2 sats ranged form 95 to 98%.

## 2019-11-01 NOTE — ED Provider Notes (Signed)
[facility] EMERGENCY DEPARTMENT Provider Note   CSN: [id number] Arrival date & time: 11/01/19  1727     History Chief Complaint  Patient presents with  . Abdominal Pain    [name] is a 64 y.o. female.  HPI  64 year old female with history of COPD, IBS, diverticulosis, arthritis, SVT, anxiety presents to the ER for several weeks of generalized abdominal pain.  She was seen by her primary care doctor and had a CT done, and was put on Cipro and Flagyl which she has recently finished on Thursday.  She started to run a fever over the last few days which began on Saturday, reports fevers as high as 102.  She reports a yeast infection on Friday and Saturday but this was resolved with Monistat.  She reports feeling bloated, has been taking Bentyl and Protonix.  She states that she felt like she got a little bit better, however now she feels gassy and bloated and tender.  She reports her stools have been small and clearly and floating.  She reports mild nausea but no vomiting.  Tolerating p.o. fluids well, has a decreased appetite.  She also reports a headache, nasal drainage, and a sore throat over the weekend as well.  She was sent here from her PCPs office for possible repeat CT given the setting of abdominal pain and fever.   Past Medical History:  Diagnosis Date  . Allergy   . Anxiety   . Arthritis   . COPD (chronic obstructive pulmonary disease) (HCC)    Home oxygen at [facility]  . Depression   . Diverticulosis   . History of cardiac catheterization    No significant CAD 2005  . Hyperlipidemia   . Hypoglycemia   . IBS (irritable bowel syndrome)   . Neuromuscular disorder ([place])   . Osteoporosis   . Pneumonia 2015  . Shingles 2012  . Sleep apnea   . SVT (supraventricular tachycardia) [facility])     Patient Active Problem List   Diagnosis Date Noted  . Solitary pulmonary nodule 09/08/2019  . Sigmoid diverticulitis 08/20/2017  . Other irritable bowel syndrome 08/20/2017  .  Hematochezia 08/20/2017  . Diverticulitis of colon 04/10/2017  . Fever 10/02/2016  . Physical deconditioning   . HSV (herpes simplex virus) anogenital infection   . Muscle weakness (generalized)   . Chronic respiratory failure with hypoxia and hypercapnia ([place]) 01/21/2015  . Leukocytosis 01/21/2015  . Macrocytic anemia 01/21/2015  . CAP (community acquired pneumonia) 11/19/2014  . Acute on chronic respiratory failure ([place]) 11/19/2014  . Streptococcus pneumoniae pneumonia ([place]) 11/19/2014  . Thrush, oral 11/19/2014  . Respiratory failure with hypercapnia ([place]) 11/14/2014  . COPD exacerbation ([place]) 11/14/2014  . Anemia 11/14/2014  . Tachycardia 11/14/2014  . Acute respiratory failure with hypercapnia ([place]) 11/14/2014  . Chest pain 09/08/2013  . Elevated glucose 05/24/2012  . COPD Group D symptoms 12/23/2011  . 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 Hyperlipidemia 12/23/2011  . Muscle spasms of neck 12/23/2011  . Diverticulitis 02/25/2011    Past Surgical History:  Procedure Laterality Date  . BACK SURGERY    . BREAST SURGERY    . CARDIAC CATHETERIZATION  2004  . COLONOSCOPY  03/22/2011   Procedure: COLONOSCOPY;  Surgeon: Rogene Houston, MD;  Location: [facility] ENDO SUITE;  Service: Endoscopy;  Laterality: N/A;  . COLONOSCOPY WITH PROPOFOL N/A 05/16/2017   Procedure: COLONOSCOPY WITH PROPOFOL;  Surgeon: Rogene Houston, MD;  Location: [facility] ENDO SUITE;  Service: Endoscopy;  Laterality:  N/A;  . FLEXIBLE SIGMOIDOSCOPY N/A 09/26/2017   Procedure: FLEXIBLE SIGMOIDOSCOPY with Propofol ;  Surgeon: Rogene Houston, MD;  Location: [facility] END[facility] SUITE;  Service: Endoscopy;  Laterality: N/A;  9:30  . POLYPECTOMY  05/16/2017   Procedure: POLYPECTOMY;  Surgeon: Rogene Houston, MD;  Location: [facility] END[facility] SUITE;  Service: Endoscopy;;  . SPINAL FUSION       OB History    Gravida  1   Para  1   Term  1   Preterm      AB      Living  2     SAB      TAB      Ectopic      Multiple  1   Live  Births              Family History  Problem Relation Age of Onset  . Asthma Mother   . Heart attack Mother   . Diabetes Mother   . Heart failure Mother   . Colon cancer Neg Hx     Social History   Tobacco Use  . Smoking status: Former Smoker    Packs/day: 1.00    Years: 40.00    Pack years: 40.00    Types: Cigarettes    Quit date: 05/07/2015    Years since quitting: 4.4  . Smokeless tobacco: Never Used  Vaping Use  . Vaping Use: Former  Substance Use Topics  . Alcohol use: Yes    Alcohol/week: 0.0 standard drinks    Comment: Rare  . Drug use: No    Home Medications Prior to Admission medications   Medication Sig Start Date End Date Taking? Authorizing Provider  acyclovir (ZOVIRAX) 800 MG tablet Take 800 mg by mouth 5 (five) times daily as needed (for break outs).  07/30/16   [provider]  albuterol (PROAIR HFA) 108 (90 BASE) MCG/ACT inhaler Inhale 2 puffs into the lungs every 6 (six) hours as needed for wheezing or shortness of breath.     [provider]  ALPRAZolam Duanne Moron) 0.25 MG tablet Take 0.25 mg by mouth 3 (three) times daily as needed for anxiety.  06/17/16   Sinda Du, MD  aspirin 81 MG tablet Take 1 tablet (81 mg total) by mouth daily. 08/14/17   Rehman, Mechele Dawley, MD  atorvastatin (LIPITOR) 40 MG tablet TAKE (1) TABLET BY MOUTH ONCE DAILY. 06/17/19   Satira Sark, MD  carboxymethylcellulose (REFRESH) 1 % ophthalmic solution Place 2 drops into both eyes 3 (three) times daily as needed (for dry eyes).    [provider]  cetirizine (ZYRTEC) 10 MG tablet Take 10 mg by mouth daily.      [provider]  clonazePAM (KLONOPIN) 0.5 MG tablet Take 1 tablet (0.5 mg total) by mouth at bedtime as needed for anxiety. 05/27/16   Rosita Fire, MD  dicyclomine (BENTYL) 10 MG capsule Take 1 capsule (10 mg total) by mouth 3 (three) times daily as needed for spasms. 08/14/17   Rehman, Mechele Dawley, MD  diltiazem (CARDIZEM CD) 240  MG 24 hr capsule Take 1 capsule (240 mg total) by mouth daily. 09/27/19   Satira Sark, MD  diltiazem (CARDIZEM) 30 MG tablet Take 1-2 tablets every 6 hours as needed for persistent tachycardai 10/09/17   Satira Sark, MD  doxycycline (VIBRAMYCIN) 100 MG capsule Take 1 capsule (100 mg total) by mouth 2 (two) times daily for 5 days. 11/01/19 11/06/19  Garald Balding,  PA-C  escitalopram (LEXAPRO) 20 MG tablet Take 20 mg by mouth daily.      [provider]  Fluticasone-Umeclidin-Vilant (TRELEGY ELLIPTA) 100-62.5-25 MCG/INH AEPB Inhale 1 puff into the lungs daily. 10/06/19   Tanda Rockers, MD  HYDROcodone-acetaminophen (NORCO/VICODIN) 5-325 MG tablet Take 2 tablets by mouth every 4 (four) hours as needed for up to 3 days. 11/01/19 11/04/19  Garald Balding, PA-C  ibuprofen (ADVIL,MOTRIN) 200 MG tablet Take 400 mg by mouth every 6 (six) hours as needed for moderate pain.    [provider]  methocarbamol (ROBAXIN) 500 MG tablet Take 1 tablet (500 mg total) by mouth 2 (two) times daily. 11/01/19   Garald Balding, PA-C  montelukast (SINGULAIR) 10 MG tablet Take 1 tablet (10 mg total) by mouth at bedtime. 01/26/15   Kathie Dike, MD  ondansetron (ZOFRAN) 4 MG tablet Take 1 tablet (4 mg total) every 8 (eight) hours as needed by mouth for nausea or vomiting. 03/20/17   Setzer, Rona Ravens, NP  oxybutynin (DITROPAN) 5 MG tablet Take 5 mg by mouth daily.  08/28/13   [provider]  OXYGEN Inhale 4 L into the lungs continuous.    [provider]  roflumilast (DALIRESP) 500 MCG TABS tablet Take 500 mcg by mouth daily.      [provider]  sodium chloride (OCEAN) 0.65 % SOLN nasal spray Place 2-3 sprays into both nostrils as needed for congestion.    [provider]  traMADol (ULTRAM) 50 MG tablet Take 50 mg by mouth every 6 (six) hours as needed for moderate pain.  07/18/16   [provider]  traZODone (DESYREL) 50 MG tablet Take 50 mg by mouth at  bedtime as needed for sleep.    [provider]  triamcinolone (NASACORT ALLERGY 24HR) 55 MCG/ACT AERO nasal inhaler Place 2 sprays into the nose 2 (two) times daily as needed (for allergies).     [provider]    Allergies    Iohexol, Neosporin [neomycin-bacitracin zn-polymyx], Adhesive [tape], Chantix [varenicline], Ketek [telithromycin], and Penicillins  Review of Systems   Review of Systems  Constitutional: Negative for chills and fever.  HENT: Positive for sore throat. Negative for ear pain.   Eyes: Negative for pain and visual disturbance.  Respiratory: Negative for cough and shortness of breath.   Cardiovascular: Negative for chest pain and palpitations.  Gastrointestinal: Positive for abdominal pain and nausea. Negative for vomiting.  Genitourinary: Negative for dysuria and hematuria.  Musculoskeletal: Negative for arthralgias and back pain.  Skin: Negative for color change and rash.  Neurological: Negative for seizures and syncope.  All other systems reviewed and are negative.   Physical Exam Updated Vital Signs BP 121/69 (BP Location: Right Arm)   Pulse 99   Temp 98.5 F (36.9 C) (Rectal)   Resp (!) 22   Ht 5' (1.524 m)   Wt 61.2 kg   SpO2 99%   BMI 26.37 kg/m   Physical Exam Vitals and nursing note reviewed.  Constitutional:      General: She is not in acute distress.    Appearance: She is well-developed. She is not ill-appearing or diaphoretic.  HENT:     Head: Normocephalic and atraumatic.  Eyes:     Conjunctiva/sclera: Conjunctivae normal.  Cardiovascular:     Rate and Rhythm: Normal rate and regular rhythm.     Heart sounds: Normal heart sounds. No murmur heard.   Pulmonary:     Effort: Pulmonary effort is  normal. No respiratory distress.     Breath sounds: Normal breath sounds.  Abdominal:     Palpations: Abdomen is soft.     Tenderness: There is generalized abdominal tenderness. There is no right CVA tenderness, left CVA  tenderness, guarding or rebound. Positive signs include McBurney's sign. Negative signs include Murphy's sign.  Musculoskeletal:     Cervical back: Neck supple.  Skin:    General: Skin is warm and dry.  Neurological:     General: No focal deficit present.     Mental Status: She is alert.  Psychiatric:        Mood and Affect: Mood normal.        Behavior: Behavior normal.     ED Results / Procedures / Treatments   Labs (all labs ordered are listed, but only abnormal results are displayed) Labs Reviewed  CBC WITH DIFFERENTIAL/PLATELET - Abnormal; Notable for the following components:      Result Value   WBC 15.9 (*)    Neutro Abs 12.4 (*)    Monocytes Absolute 1.1 (*)    Eosinophils Absolute 0.6 (*)    All other components within normal limits  COMPREHENSIVE METABOLIC PANEL - Abnormal; Notable for the following components:   Chloride 97 (*)    Glucose, Bld 105 (*)    AST 14 (*)    All other components within normal limits  URINALYSIS, ROUTINE W REFLEX MICROSCOPIC - Abnormal; Notable for the following components:   Color, Urine STRAW (*)    Specific Gravity, Urine 1.003 (*)    Hgb urine dipstick MODERATE (*)    All other components within normal limits  LIPASE, BLOOD    EKG None  Radiology CT ABDOMEN PELVIS WO CONTRAST  Result Date: 11/01/2019 CLINICAL DATA:  Acute abdominal pain.  Fever. EXAM: CT ABDOMEN AND PELVIS WITHOUT CONTRAST TECHNIQUE: Multidetector CT imaging of the abdomen and pelvis was performed following the standard protocol without IV contrast. COMPARISON:  Most recent comparison 10/15/2019. Additional prior exams reviewed. FINDINGS: Lower chest: Emphysema. Small focal consolidation in the paramediastinal right middle lobe has progressed from prior exam and may represent atelectasis or pneumonia in the setting of fever. Scattered calcified granuloma. There is a trace right pleural effusion that is new from prior exam. Hepatobiliary: No focal hepatic abnormality  on noncontrast exam. Gallstone versus posterior wall calcification unchanged. No pericholecystic inflammation. No biliary dilatation. Pancreas: No ductal dilatation or inflammation. Spleen: Normal in size without focal abnormality. Adrenals/Urinary Tract: Normal adrenal glands. No hydronephrosis. No perinephric edema. No renal calculi or evidence of focal lesion. Urinary bladder is partially distended. No bladder wall thickening. Stomach/Bowel: Nondistended stomach but is not well evaluated. Normal positioning of the duodenum there is no small bowel obstruction or evidence of small bowel inflammation. Mobile cecum located in left abdomen, similar to prior exams. Appendix not definitively visualized on the current exam. There is no pericecal inflammation. Moderate volume of stool throughout the colon. Diverticulosis from the descending colon distally. Persistent wall thickening involving the proximal sigmoid colon, series 2, image 58. Suggestion of mild adjacent soft tissue stranding again seen. No other findings of colonic wall thickening or inflammatory change. Vascular/Lymphatic: Aorto bi-iliac atherosclerosis. No aortic aneurysm. Stable 10 mm perigastric node in the upper abdomen, series 2, image 20. No new or progressive adenopathy. Reproductive: Uterus is unremarkable. Ovaries not definitively defined. No adnexal mass. Other: No free air or ascites. No evidence of intra-abdominal abscess. Laxity of anterior abdominal musculature. Musculoskeletal: There are no acute or  suspicious osseous abnormalities. Chronic mild T12 superior endplate compression fracture. Chronic avascular necrosis of the left femoral head. No subchondral collapse. Tarlov cysts in the sacrum. IMPRESSION: 1. Persistent wall thickening involving the proximal sigmoid colon with mild adjacent soft tissue stranding, similar to recent CT. While this may represent persistent or recurrent diverticulitis or mural hypertrophy, recommend direct  visualization with colonoscopy to exclude underlying colonic neoplasm. 2. Trace right pleural effusion is new from prior exam. Small focal consolidation in the paramediastinal right middle lobe is also new and may represent atelectasis or pneumonia in the setting of fever. 3. Large colonic stool burden again seen. 4. Additional stable chronic findings as described. Aortic Atherosclerosis (ICD10-I70.0) and Emphysema (ICD10-J43.9). Electronically Signed   By: Keith Rake M.D.   On: 11/01/2019 20:57   DG Chest Portable 1 View  Result Date: 11/01/2019 CLINICAL DATA:  Abnormal CT. EXAM: PORTABLE CHEST 1 VIEW COMPARISON:  CT from the same day.  Sep 07, 2019 FINDINGS: Scattered bilateral calcified pulmonary nodules are noted. There are chronic lung changes at the lung bases bilaterally. There is an opacity in the medial right middle lobe as previously seen. There is a trace right-sided pleural effusion. Atherosclerotic changes are noted. The heart size is stable. There is no pneumothorax. Severe emphysema is noted. IMPRESSION: 1. Again noted are findings concerning for right middle lobe pneumonia. Follow-up to resolution is recommended. 2. Trace right-sided pleural effusion. 3. Emphysema. Electronically Signed   By: Constance Holster M.D.   On: 11/01/2019 22:08    Procedures Procedures (including critical care time)  Medications Ordered in ED Medications  sodium chloride 0.9 % bolus 1,000 mL (0 mLs Intravenous Stopped 11/01/19 2206)  morphine 2 MG/ML injection 2 mg (2 mg Intravenous Given 11/01/19 2006)  methocarbamol (ROBAXIN) tablet 500 mg (500 mg Oral Given 11/01/19 2154)    ED Course  I have reviewed the triage vital signs and the nursing notes.  Pertinent labs & imaging results that were available during my care of the patient were reviewed by me and considered in my medical decision making (see chart for details).    MDM Rules/Calculators/A&P                         64 year old female with  several weeks of abdominal pain, sent for PCP for repeat CT.  Recently diagnosed with diverticulitis and has finished antibiotic course on Thursday.  On presentation, the patient is alert and oriented, nontoxic-appearing, no acute distress.  Vitals are overall very reassuring.  She is afebrile here in the ED, with a rectal temperature of 98.5.  Physical exam with generalized abdominal tenderness.  Questionable flank tenderness on the left.  CBC with a leukocytosis of 15.9, normal hemoglobin.  CMP without any significant electrode abnormalities, normal creatinine.  Lipase normal.  UA with moderate hemoglobin in the urine.Patient has an anaphylactic allergy listed to contrast dye, however she denies this and does not know why this is per chart.  CT noncontrast without any acute changes, no evidence of perforation, recurrent diverticulitis. Radiology again suggests a colonoscopy to rule out a colonic mass.  Questionable pneumonia noted in the right lower lobe which may explain her fever.  Chest x-ray also confirms this as well.  We will treat for CAP with Doxy.  Patient states that she follows with Dr. Dereck Leep with GI and I encouraged her to follow-up with him.  Also stressed followup with her PCP.The patient is able to ambulate without  oxygen desaturations and tolerated p.o. in the ED well.  Will provide Robaxin, doxycycline and a short course of Vicodin for pain.  Strict return precautions given at this stage in the ED course, the patient is medically screened and stable for discharge.  Discussed the case with Dr. Sabra Heck who is agreeable to the above plan and disposition.  Final Clinical Impression(s) / ED Diagnoses Final diagnoses:  Community acquired pneumonia of right lower lobe of lung    Rx / DC Orders ED Discharge Orders         Ordered    methocarbamol (ROBAXIN) 500 MG tablet  2 times daily     Discontinue  Reprint     11/01/19 2210    doxycycline (VIBRAMYCIN) 100 MG capsule  2 times daily      Discontinue  Reprint     11/01/19 2215    HYDROcodone-acetaminophen (NORCO/VICODIN) 5-325 MG tablet  Every 4 hours PRN     Discontinue  Reprint     11/01/19 2216           Garald Balding, PA-C 11/03/19 [id number]    Noemi Chapel, MD 11/11/19 [phone]

## 2019-11-01 NOTE — Discharge Instructions (Addendum)
Please make sure to follow up with Dr. Laural Golden about the symptoms you were experiencing today. Please take the antibiotic as prescribed for your pneumonia.  Please do not drink or drive on the muscle relaxer, and take at night as it does have sedating side effects.  I have also provided a short course of pain medication for pain.  Return to the ER if your symptoms worsen.

## 2019-11-01 NOTE — ED Triage Notes (Signed)
Pt c/o abdominal pain for the last couple of weeks and was starting to feel better until a couple days ago when she started running a fever and feeling lethargic

## 2019-11-09 ENCOUNTER — Ambulatory Visit: Payer: Medicare Other | Admitting: Family Medicine

## 2019-11-18 ENCOUNTER — Encounter (INDEPENDENT_AMBULATORY_CARE_PROVIDER_SITE_OTHER): Payer: Self-pay | Admitting: Internal Medicine

## 2019-11-18 ENCOUNTER — Ambulatory Visit (INDEPENDENT_AMBULATORY_CARE_PROVIDER_SITE_OTHER): Payer: Medicare Other | Admitting: Internal Medicine

## 2019-11-18 ENCOUNTER — Other Ambulatory Visit: Payer: Self-pay

## 2019-11-18 VITALS — BP 107/62 | HR 88 | Temp 99.5°F | Ht 63.0 in | Wt 126.5 lb

## 2019-11-18 DIAGNOSIS — B3731 Acute candidiasis of vulva and vagina: Secondary | ICD-10-CM | POA: Insufficient documentation

## 2019-11-18 DIAGNOSIS — B373 Candidiasis of vulva and vagina: Secondary | ICD-10-CM | POA: Diagnosis not present

## 2019-11-18 DIAGNOSIS — K5732 Diverticulitis of large intestine without perforation or abscess without bleeding: Secondary | ICD-10-CM

## 2019-11-18 MED ORDER — FLUCONAZOLE 100 MG PO TABS: 100.0000 mg | ORAL_TABLET | Freq: Every day | ORAL | 0 refills | Status: DC

## 2019-11-18 NOTE — Patient Instructions (Signed)
Please notify if you have templated greater than 100 F. Will wait for your visit with surgeon before considering endoscopy and colonoscopy.

## 2019-11-18 NOTE — Progress Notes (Signed)
Presenting complaint;  Abdominal pain. History of recurrent sigmoid colon diverticulitis.  Abnormal CT.  Database and subjective:  Patient is 64 year old Caucasian female who has history of diverticulitis and is here for scheduled visit.  She has been previously evaluated. She had colonoscopy in anuary 2019 after having undergone antibiotic therapy for diverticulitis.  She had 2 small polyps removed and these were tubular adenomas.  She had sigmoid diverticulosis along with mucopurulent material consistent with active sigmoid diverticulitis.  She also had external hemorrhoids.  She was treated with antibiotics.  She continued to experience LLQ abdominal pain and follow-up CT revealed persistent thickening to sigmoid colon.  She therefore underwent flexible sigmoidoscopy in May 2019 which was limited because of present of formed stool.  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  She was advised to take dicyclomine and felt better. On her last visit of March 2020 she indicated to me that she was doing well.  She was having 2-3 stools per day and most of these are formed.  She was having loose to mushy stools once a week.  She was taking dicyclomine.  She was not having much in the way of pain or urgency or accidents. Patient was advised to refrain from using NSAIDs. She was supposed to return in September for 53-monthvisit but this visit were canceled by patient because of Covid pandemic.  Patient states she was doing well until about 2 months ago when she experienced chest pain while trying to swallow multiple pills at the same time.  Since then she is been swallowing 1 or 2 pills at a time and she has not experience chest pain but she has been having abdominal pain and bloating.  She complains of pain in left flank epigastric region as well as lower abdomen.  She also has noted throat discomfort on the right side which is  experienced when she swallows food and may have a dry cough.  Because of persistent left flank pain she underwent unenhanced abdominal pelvic CT on 10/15/2019.  This study revealed sigmoid colon diverticulosis with findings suspicious of mild uncomplicated diverticulitis and there was concern for masslike appearance in the sigmoid colon.  She also had a large amount of stool in the colon as well as cholelithiasis without changes of cholecystitis.  She was treated with Cipro and Flagyl for about 10 days.  She thought she was doing better but on 10/31/2019 she had temp  of 102F.  The following day she was seen in the emergency room.  She had abdominal pelvic CT with contrast which revealed persistent changes of sigmoid diverticulitis and sigmoid colon and she was also felt to have possible pneumonia at right lower lobe.  She was treated with cephalexin and doxycycline. Patient was advised to follow-up with GI. She continues to complain of left flank pain which occurs with posture change when she lies on her left side she is not having hematuria or dysuria.  She says she was treated for Candida vaginitis by Dr. FWilley Bladewith 100 mg of Diflucan but she is not any better.  She continues to have intermittent left lower quadrant pain.  She has noted decrease in her appetite.  She has lost 16 pounds since her last visit and she states all of this weight loss occurred in the last 2 months.  She also complains of rectal discomfort.  She noted small amount of blood per rectum with bowel  movement about 2 weeks ago. She remains on 4 L of nasal O2.  Current Medications: Outpatient Encounter Medications as of 11/18/2019  Medication Sig  . acyclovir (ZOVIRAX) 800 MG tablet Take 800 mg by mouth 5 (five) times daily as needed (for break outs).   . ALPRAZolam (XANAX) 0.25 MG tablet Take 0.25 mg by mouth 3 (three) times daily as needed for anxiety.   Marland Kitchen aspirin 81 MG tablet Take 1 tablet (81 mg total) by mouth daily.  Marland Kitchen  atorvastatin (LIPITOR) 40 MG tablet TAKE (1) TABLET BY MOUTH ONCE DAILY.  . carboxymethylcellulose (REFRESH) 1 % ophthalmic solution Place 2 drops into both eyes 3 (three) times daily as needed (for dry eyes).  . cetirizine (ZYRTEC) 10 MG tablet Take 10 mg by mouth daily.    . clonazePAM (KLONOPIN) 0.5 MG tablet Take 1 tablet (0.5 mg total) by mouth at bedtime as needed for anxiety.  . dicyclomine (BENTYL) 10 MG capsule Take 1 capsule (10 mg total) by mouth 3 (three) times daily as needed for spasms.  Marland Kitchen diltiazem (CARDIZEM CD) 240 MG 24 hr capsule Take 1 capsule (240 mg total) by mouth daily.  Marland Kitchen diltiazem (CARDIZEM) 30 MG tablet Take 1-2 tablets every 6 hours as needed for persistent tachycardai  . escitalopram (LEXAPRO) 20 MG tablet Take 20 mg by mouth daily.    . Fluticasone-Umeclidin-Vilant (TRELEGY ELLIPTA) 100-62.5-25 MCG/INH AEPB Inhale 1 puff into the lungs daily.  Marland Kitchen ibuprofen (ADVIL,MOTRIN) 200 MG tablet Take 400 mg by mouth every 6 (six) hours as needed for moderate pain.  . methocarbamol (ROBAXIN) 500 MG tablet Take 1 tablet (500 mg total) by mouth 2 (two) times daily.  . montelukast (SINGULAIR) 10 MG tablet Take 1 tablet (10 mg total) by mouth at bedtime.  . ondansetron (ZOFRAN) 4 MG tablet Take 1 tablet (4 mg total) every 8 (eight) hours as needed by mouth for nausea or vomiting.  Marland Kitchen oxybutynin (DITROPAN) 5 MG tablet Take 5 mg by mouth daily.   . OXYGEN Inhale 4 L into the lungs continuous.  . roflumilast (DALIRESP) 500 MCG TABS tablet Take 500 mcg by mouth daily.    . sodium chloride (OCEAN) 0.65 % SOLN nasal spray Place 2-3 sprays into both nostrils as needed for congestion.  . traMADol (ULTRAM) 50 MG tablet Take 50 mg by mouth every 6 (six) hours as needed for moderate pain.   . traZODone (DESYREL) 50 MG tablet Take 50 mg by mouth at bedtime as needed for sleep.  Marland Kitchen triamcinolone (NASACORT ALLERGY 24HR) 55 MCG/ACT AERO nasal inhaler Place 2 sprays into the nose 2 (two) times daily as  needed (for allergies).   Marland Kitchen albuterol (PROAIR HFA) 108 (90 BASE) MCG/ACT inhaler Inhale 2 puffs into the lungs every 6 (six) hours as needed for wheezing or shortness of breath.  (Patient not taking: Reported on 11/18/2019)   No facility-administered encounter medications on file as of 11/18/2019.   Past Medical History:  Diagnosis Date  . Allergy   . Anxiety   . Arthritis   . COPD (chronic obstructive pulmonary disease) (HCC)    Home oxygen at [facility]  . Depression   . Diverticulosis   . History of cardiac catheterization    No significant CAD 2005  . Hyperlipidemia   . Hypoglycemia   . IBS (irritable bowel syndrome)   . Neuromuscular disorder (HCC)   . Osteoporosis   . Pneumonia 2015  . Shingles 2012  . Sleep apnea   . SVT (supraventricular  tachycardia) [facility])    Past Surgical History:  Procedure Laterality Date  . BACK SURGERY    . BREAST SURGERY    . CARDIAC CATHETERIZATION  2004  . COLONOSCOPY  03/22/2011   Procedure: COLONOSCOPY;  Surgeon: Rogene Houston, MD;  [facility];  Service: Endoscopy;  Laterality: N/A;  . COLONOSCOPY WITH PROPOFOL N/A 05/16/2017   Procedure: COLONOSCOPY WITH PROPOFOL;  Surgeon: Rogene Houston, MD;  [facility];  Service: Endoscopy;  Laterality: N/A;  . FLEXIBLE SIGMOIDOSCOPY N/A 09/26/2017   Procedure: FLEXIBLE SIGMOIDOSCOPY with Propofol ;  Surgeon: Rogene Houston, MD;  Location: [facility];  Service: Endoscopy;  Laterality: N/A;  9:30  . POLYPECTOMY  05/16/2017   Procedure: POLYPECTOMY;  Surgeon: Rogene Houston, MD;  [facility]: [facility];  Service: Endoscopy;;  . SPINAL FUSION        Objective: Blood pressure 107/62, pulse 88, temperature 99.5 F (37.5 C), temperature source Oral, height '5\' 3"'$  (1.6 m), weight 126 lb 8 oz (57.4 kg). Patient is alert and in no acute distress. Conjunctiva is pink. Sclera is nonicteric Oropharyngeal mucosa is normal. No neck masses or thyromegaly noted. Cardiac exam with  regular rhythm normal S1 and S2. No murmur or gallop noted. Auscultation of lungs revealed diminished intensity of breath sounds bilaterally.  No rales or rhonchi noted. Abdomen is symmetrical.  Bowel sounds are normal.  No bruits noted.  She has mild generalized tenderness without guarding or rebound.  No organomegaly or masses. No LE edema or clubbing noted.  Labs/studies Results:  CBC Latest Ref Rng & Units 11/01/2019 09/22/2017 05/13/2017  WBC 4.0 - 10.5 K/uL 15.9(H) 14.4(H) 13.2(H)  Hemoglobin 12.0 - 15.0 g/dL 12.3 12.1 11.9(L)  Hematocrit 36 - 46 % 39.0 37.2 37.4  Platelets 150 - 400 K/uL 354 308 349    CMP Latest Ref Rng & Units 11/01/2019 10/15/2019 09/22/2017  Glucose 70 - 99 mg/dL 105(H) - 150(H)  BUN 8 - 23 mg/dL 9 - 15  Creatinine 0.44 - 1.00 mg/dL 0.68 0.80 0.85  Sodium 135 - 145 mmol/L 139 - 137  Potassium 3.5 - 5.1 mmol/L 3.8 - 3.9  Chloride 98 - 111 mmol/L 97(L) - 96(L)  CO2 22 - 32 mmol/L 31 - 32  Calcium 8.9 - 10.3 mg/dL 9.0 - 8.8(L)  Total Protein 6.5 - 8.1 g/dL 7.9 - -  Total Bilirubin 0.3 - 1.2 mg/dL 0.6 - -  Alkaline Phos 38 - 126 U/L 119 - -  AST 15 - 41 U/L 14(L) - -  ALT 0 - 44 U/L 14 - -    Hepatic Function Latest Ref Rng & Units 11/01/2019 09/11/2016 05/13/2016  Total Protein 6.5 - 8.1 g/dL 7.9 6.7 7.2  Albumin 3.5 - 5.0 g/dL 4.1 3.8 3.7  AST 15 - 41 U/L 14(L) 24 23  ALT 0 - 44 U/L '14 21 19  '$ Alk Phosphatase 38 - 126 U/L 119 100 88  Total Bilirubin 0.3 - 1.2 mg/dL 0.6 0.3 0.3    CT from 11/01/2019 reviewed along with the blood work.  Assessment:  #1.  Recurrent sigmoid diverticulitis.  She has been experiencing symptoms since January 24, 2011.  Her exam today does not suggest sigmoid diverticulitis.  She was treated with Cipro and Flagyl last month and retreated with cephalexin and doxycycline primarily for pneumonia but should have also helped any residual diverticulitis.  Patient's last colonoscopy was in January 2019 and she had a sigmoidoscopy 4 months  later. Changes of wall thickening in sigmoid colon are chronic and would argue against carcinoma. In the past I felt that she also has an IBS.  Surgery has been considered but not pursued because of underlying severe lung disease.  Since she is having symptoms again I think it would be a good idea to get a surgical consultation and see if her risk is acceptable. Patient is interested in undergoing colonoscopy which will be scheduled once surgical consultation completed. With history of throat and chest symptoms I would also consider diagnostic esophagogastroduodenoscopy.  #2.  Candida vaginitis.  #3.  Left flank pain appears to be musculoskeletal.   Plan:  Diflucan 100 mg p.o. daily for 3 to 5 days.  Prescription given for 1 week.  She will have few extra doses that she can keep her future use. Await surgical consultation before scheduling colonoscopy.

## 2019-11-23 DIAGNOSIS — K5732 Diverticulitis of large intestine without perforation or abscess without bleeding: Secondary | ICD-10-CM | POA: Diagnosis not present

## 2019-11-23 DIAGNOSIS — K56699 Other intestinal obstruction unspecified as to partial versus complete obstruction: Secondary | ICD-10-CM | POA: Diagnosis not present

## 2019-11-23 DIAGNOSIS — R4702 Dysphasia: Secondary | ICD-10-CM | POA: Diagnosis not present

## 2019-11-23 DIAGNOSIS — J449 Chronic obstructive pulmonary disease, unspecified: Secondary | ICD-10-CM | POA: Diagnosis not present

## 2019-11-23 DIAGNOSIS — K429 Umbilical hernia without obstruction or gangrene: Secondary | ICD-10-CM | POA: Diagnosis not present

## 2019-11-23 DIAGNOSIS — Z8679 Personal history of other diseases of the circulatory system: Secondary | ICD-10-CM | POA: Diagnosis not present

## 2019-11-26 ENCOUNTER — Telehealth: Payer: Self-pay | Admitting: *Deleted

## 2019-11-26 NOTE — Telephone Encounter (Signed)
   Primary Cardiologist: Rozann Lesches, MD  Chart reviewed as part of pre-operative protocol coverage. Given past medical history and time since last visit, based on ACC/AHA guidelines, [name] would be at acceptable risk for the planned procedure without further cardiovascular testing.   I will route this recommendation to the requesting party via Epic fax function and remove from pre-op pool.  Please call with questions.  Jossie Ng. Ashawna Hanback NP-C    11/26/2019, 9:42 AM [place] [address] Office [phone] Fax [phone]

## 2019-11-26 NOTE — Telephone Encounter (Signed)
° °  [facility] Pre-operative Risk Assessment    HEARTCARE STAFF: - Please ensure there is not already an duplicate clearance open for this procedure. - Under Visit Info/Reason for Call, type in Other and utilize the format Clearance MM/DD/YY or Clearance TBD. Do not use dashes or single digits. - If request is for dental extraction, please clarify the # of teeth to be extracted.  Request for surgical clearance:  1. What type of surgery is being performed?  ROBOTIC SIGMOID COLECTOMY   2. When is this surgery scheduled?  TBD   3. What type of clearance is required (medical clearance vs. Pharmacy clearance to hold med vs. Both)?  [place]  4. Are there any medications that need to be held prior to surgery and how long? N/A   5. Practice name and name of physician performing surgery?  [facility] / DR. Johney Maine  6. What is the office phone number?  [phone]   7.   What is the office fax number?  [phone]  8.   Anesthesia type (None, local, MAC, general) ?  GENERAL   Jeanann Lewandowsky 11/26/2019, 9:22 AM  _________________________________________________________________   (provider comments below)

## 2019-12-01 ENCOUNTER — Telehealth: Payer: Self-pay | Admitting: Internal Medicine

## 2019-12-01 NOTE — Telephone Encounter (Signed)
Form received and in Dr Gustavus Bryant lookat in [place] with [facility] and notified  Fine to wait until next wk

## 2019-12-06 NOTE — Telephone Encounter (Signed)
[facility] and informed them that surgical clearance has been signed and will be faxed later today.

## 2019-12-06 NOTE — Telephone Encounter (Signed)
Done

## 2019-12-15 ENCOUNTER — Telehealth: Payer: Self-pay | Admitting: Internal Medicine

## 2019-12-15 NOTE — Telephone Encounter (Signed)
I can not locate the form  Looks like this was already faxed and probably in process of being scanned  [facility] for [name]

## 2019-12-15 NOTE — Telephone Encounter (Signed)
Will await fax.

## 2019-12-15 NOTE — Telephone Encounter (Signed)
[name] returning missed call. States they have not received a surgical clearance but will fx over another for

## 2019-12-16 NOTE — Telephone Encounter (Signed)
[name], please advise if you have received this yet. Thanks.

## 2019-12-16 NOTE — Telephone Encounter (Signed)
I have the form and will give to Dr Melvyn Novas in [place] office tomorrow 8/13

## 2019-12-17 NOTE — Telephone Encounter (Signed)
[name] has Dr. Melvyn Novas signed this form and has it been faced back to [facility]?

## 2019-12-17 NOTE — Telephone Encounter (Signed)
Records regarding need for surgery clearance given to Dr Melvyn Novas in [place] 12/17/19

## 2019-12-17 NOTE — Telephone Encounter (Signed)
She is cleared for surgery but moderately high risk due to respiratory status and will likely need bipap bridge post op.   Send last ov

## 2019-12-17 NOTE — Telephone Encounter (Signed)
Letter has been typed and printed stating that:  She is cleared for surgery but moderately high risk due to respiratory status and will likely need bipap bridge post op. Per Dr. Melvyn Novas. Copy of letter and last OV notes have been faxed to office. I have also called them to let them know that I was sending it over. Nothing further needed at this time

## 2019-12-21 ENCOUNTER — Ambulatory Visit: Payer: Self-pay | Admitting: Surgery

## 2019-12-24 ENCOUNTER — Telehealth: Payer: Self-pay | Admitting: Internal Medicine

## 2019-12-24 NOTE — Telephone Encounter (Signed)
Fine with me  - good fit since I don't do sleep

## 2019-12-24 NOTE — Telephone Encounter (Signed)
[facility] patient LMTCB  Dr. Melvyn Novas and Dr. Halford Chessman please advise  Patient sees Dr. Melvyn Novas but wanted to switch pulmonary and sleep to Dr. Halford Chessman. Also was in the hospital recently and incidentally had pneumonia. Wondered if she would need any follow-up. Please advise. May leave detailed message on machine/vm.    Dr. Melvyn Novas are you ok with switch? Dr. Halford Chessman are you ok with switch?

## 2019-12-25 NOTE — Telephone Encounter (Signed)
I am okay with switch. 

## 2019-12-27 NOTE — Telephone Encounter (Signed)
[facility] patient left message letting patient know that both providers are ok with patient switching. Patient is scheduled with Dr. Halford Chessman in October. Advised her to keep that appointment and to call office if she needed anything sooner.  Nothing further needed at this.

## 2020-02-14 ENCOUNTER — Ambulatory Visit (INDEPENDENT_AMBULATORY_CARE_PROVIDER_SITE_OTHER): Payer: Medicare Other | Admitting: Pulmonary Disease

## 2020-02-14 ENCOUNTER — Encounter: Payer: Self-pay | Admitting: Pulmonary Disease

## 2020-02-14 ENCOUNTER — Other Ambulatory Visit: Payer: Self-pay

## 2020-02-14 ENCOUNTER — Ambulatory Visit (HOSPITAL_COMMUNITY)
Admission: RE | Admit: 2020-02-14 | Discharge: 2020-02-14 | Disposition: A | Discharge status: Home / Self Care | Payer: Medicare Other | Source: Ambulatory Visit | Attending: Pulmonary Disease | Admitting: Pulmonary Disease

## 2020-02-14 VITALS — BP 128/80 | HR 80 | Temp 97.8°F | Ht 61.0 in | Wt 126.2 lb

## 2020-02-14 DIAGNOSIS — Z8701 Personal history of pneumonia (recurrent): Secondary | ICD-10-CM

## 2020-02-14 DIAGNOSIS — J9611 Chronic respiratory failure with hypoxia: Secondary | ICD-10-CM | POA: Diagnosis not present

## 2020-02-14 DIAGNOSIS — G4733 Obstructive sleep apnea (adult) (pediatric): Secondary | ICD-10-CM | POA: Diagnosis not present

## 2020-02-14 DIAGNOSIS — J9612 Chronic respiratory failure with hypercapnia: Secondary | ICD-10-CM

## 2020-02-14 DIAGNOSIS — J449 Chronic obstructive pulmonary disease, unspecified: Secondary | ICD-10-CM | POA: Diagnosis not present

## 2020-02-14 DIAGNOSIS — Z23 Encounter for immunization: Secondary | ICD-10-CM | POA: Diagnosis not present

## 2020-02-14 DIAGNOSIS — J432 Centrilobular emphysema: Secondary | ICD-10-CM | POA: Diagnosis not present

## 2020-02-14 MED ORDER — CLONAZEPAM 0.5 MG PO TABS
0.5000 mg | ORAL_TABLET | Freq: Every day | ORAL | 5 refills | Status: DC
Start: 2020-02-14 — End: 2023-02-20

## 2020-02-14 NOTE — Addendum Note (Signed)
Addended by: Chesley Mires on: 02/14/2020 03:30 PM   Modules accepted: Orders

## 2020-02-14 NOTE — Patient Instructions (Signed)
Will have your sign release form to get medical records from Dr. Algernon Huxley office   Chest xray today  Will get copy of your Bipap report from [place] to get flu shot today  Try using [place] and [place] on daily basis  Will send in refill for clonazepam  Follow up in 4 months

## 2020-02-14 NOTE — Progress Notes (Addendum)
Pulmonary, Critical Care, and Sleep Medicine  Chief Complaint  Patient presents with  . Follow-up    transfer from Dr Melvyn Novas for sleep (on bipap)    Constitutional:  BP 128/80 (BP Location: Left Arm, Cuff Size: Normal)   Pulse 80   Temp 97.8 F (36.6 C) (Other (Comment)) Comment (Src): wrist  Ht 5\' 1"  (1.549 m)   Wt 126 lb 3.2 oz (57.2 kg)   SpO2 100% Comment: 4L O2  BMI 23.85 kg/m   Past Medical History:  Anxiety, OA, Depression, Diverticulosis, HLD, IBS, Osteoporosis, PNA, Shingles, SVT  Past Surgical History:  Her  has a past surgical history that includes Spinal fusion; Back surgery; Colonoscopy (03/22/2011); Colonoscopy with propofol (N/A, 05/16/2017); polypectomy (05/16/2017); Cardiac catheterization (2004); Flexible sigmoidoscopy (N/A, 09/26/2017); and Breast surgery.  Brief Summary:  [name] is a 64 y.o. female former smoker with COPD from emphysema and chronic respiratory failure with hypoxia.      Subjective:  Previously seen by Dr. Marina Gravel, Dr. Luan Pulling, and Dr. Melvyn Novas.  She quit smoking years ago.  Her spirometry from 2013 showed very severe obstruction.    She was found to have pneumonia on CXR from June 2021.  She has sinus congestion with post nasal drip.  Uses 4 to 7 liters oxygen.  Using Bipap at night.  Not having wheeze, chest congestion, or fever.  Has chronic diverticulitis.  Seen by Dr. Laural Golden with GI and advised she is high risk for complication and would eventually need surgery.  Physical Exam:   Appearance - well kempt, wearing oxygen  ENMT - no sinus tenderness, no oral exudate, no LAN, Mallampati 2 airway, no stridor  Respiratory - equal breath sounds bilaterally, no wheezing or rales  CV - s1s2 regular rate and rhythm, no murmurs  Ext - no clubbing, no edema  Skin - no rashes  Psych - normal mood and affect   Pulmonary testing:   Spirometry 12/31/11 >> FEV1 0.4 (18%), FEV1% 42  Chest Imaging:   CT chest 10/16/19 >>  mod/severe centrilobular emphysema, granuloma RUL  Sleep Tests:    Cardiac Tests:   Echo 06/26/15 >> EF 60 to 65%, grade 1 DD  Social History:  She  reports that she quit smoking about 4 years ago. Her smoking use included cigarettes. She has a 40.00 pack-year smoking history. She has never used smokeless tobacco. She reports current alcohol use. She reports that she does not use drugs.  Family History:  Her family history includes Asthma in her mother; Diabetes in her mother; Heart attack in her mother; Heart failure in her mother.     Assessment/Plan:   Severe COPD with emphysema. - continue trelegy, daliresp - prn albuterol - influenza vaccine today - might need to check A1AT level if not previously checked at Dr. Algernon Huxley office  Obstructive sleep apnea. - she reports compliance with Bipap and benefit from therapy - uses [place] as DME for Bipap - will get copy of her Bipap download and medical records from Dr. Algernon Huxley office  REM Sleep Behavior disorder. - will get copy of sleep study from Dr. Algernon Huxley office - refill clonazepam 0.5 mg qhs  Chronic respiratory failure with hypoxia. - she uses 4 to 7 liters oxygen 24/7 - uses [facility] as DME for oxygen set up  Allergic rhinitis. - will have her try Ayr and nasacort on regular basis - continue zyrtec, singulair  History of pneumonia in June 2021. - will repeat chest xray today  Chronic sigmoid diverticulitis. - followed by Dr. Laural Golden with GI - discussed that she would be at high risk for perioperative respiratory complications if she needed to have surgery, and at that point it would be risk/benefit analysis and what her comfort level is with risk about proceeding with surgery  COVID 19 advice. - she is high risk category based on medical conditions - I have advised her to get COVID 19 vaccine booster once this is approved for Moderna vaccine   Time Spent Involved in Patient Care on Day of Examination:  43  minutes  Follow up:  Patient Instructions  Will have your sign release form to get medical records from Dr. Algernon Huxley office   Chest xray today  Will get copy of your Bipap report from [place] to get flu shot today  Try using Ayr and Nasacort on daily basis  Will send in refill for clonazepam  Follow up in 4 months   Medication List:   Allergies as of 02/14/2020      Reactions   Iohexol Anaphylaxis, Other (See Comments)    Desc: IV DYE  ANAPHYLATIC SHOCK X2 ONCE AFTER PREMEDS   Neosporin [neomycin-bacitracin Zn-polymyx] Itching   Adhesive [tape] Itching, Other (See Comments)   Break out   Chantix [varenicline] Other (See Comments)   PALPITATIONS   Ketek [telithromycin] Other (See Comments)   Loopy, eyes went different directions   Penicillins Other (See Comments)   Unknown Has patient had a PCN reaction causing immediate rash, facial/tongue/throat swelling, SOB or lightheadedness with hypotension: Unknown Has patient had a PCN reaction causing severe rash involving mucus membranes or skin necrosis: Unknown Has patient had a PCN reaction that required hospitalization: Unknown Has patient had a PCN reaction occurring within the last 10 years: No If all of the above answers are "NO", then may proceed with Cephalosporin use.      Medication List       Accurate as of February 14, 2020  3:29 PM. If you have any questions, ask your nurse or doctor.        STOP taking these medications   fluconazole 100 MG tablet Commonly known as: DIFLUCAN Stopped by: Chesley Mires, MD   ProAir HFA 108 [phone] Base) MCG/ACT inhaler Generic drug: albuterol Stopped by: Chesley Mires, MD     TAKE these medications   acyclovir 800 MG tablet Commonly known as: ZOVIRAX Take 800 mg by mouth 5 (five) times daily as needed (for break outs).   ALPRAZolam 0.25 MG tablet Commonly known as: XANAX Take 0.25 mg by mouth 3 (three) times daily as needed for anxiety.   aspirin 81 MG  tablet Take 1 tablet (81 mg total) by mouth daily.   atorvastatin 40 MG tablet Commonly known as: LIPITOR TAKE (1) TABLET BY MOUTH ONCE DAILY.   cetirizine 10 MG tablet Commonly known as: ZYRTEC Take 10 mg by mouth daily.   clonazePAM 0.5 MG tablet Commonly known as: KLONOPIN Take 1 tablet (0.5 mg total) by mouth at bedtime. What changed:   when to take this  reasons to take this Changed by: Chesley Mires, MD   Daliresp 500 MCG Tabs tablet Generic drug: roflumilast Take 500 mcg by mouth daily.   dicyclomine 10 MG capsule Commonly known as: Bentyl Take 1 capsule (10 mg total) by mouth 3 (three) times daily as needed for spasms.   diltiazem 240 MG 24 hr capsule Commonly known as: CARDIZEM CD Take 1 capsule (240 mg total) by mouth  daily.   diltiazem 30 MG tablet Commonly known as: Cardizem Take 1-2 tablets every 6 hours as needed for persistent tachycardai   escitalopram 20 MG tablet Commonly known as: LEXAPRO Take 20 mg by mouth daily.   ibuprofen 200 MG tablet Commonly known as: ADVIL Take 400 mg by mouth every 6 (six) hours as needed for moderate pain.   methocarbamol 500 MG tablet Commonly known as: ROBAXIN Take 1 tablet (500 mg total) by mouth 2 (two) times daily.   montelukast 10 MG tablet Commonly known as: SINGULAIR Take 1 tablet (10 mg total) by mouth at bedtime.   Nasacort Allergy 24HR 55 MCG/ACT Aero nasal inhaler Generic drug: triamcinolone Place 2 sprays into the nose 2 (two) times daily as needed (for allergies).   ondansetron 4 MG tablet Commonly known as: ZOFRAN Take 1 tablet (4 mg total) every 8 (eight) hours as needed by mouth for nausea or vomiting.   oxybutynin 5 MG tablet Commonly known as: DITROPAN Take 5 mg by mouth daily.   OXYGEN Inhale 4 L into the lungs continuous.   REFRESH 1 % ophthalmic solution Generic drug: carboxymethylcellulose Place 2 drops into both eyes 3 (three) times daily as needed (for dry eyes).   sodium  chloride 0.65 % Soln nasal spray Commonly known as: OCEAN Place 2-3 sprays into both nostrils as needed for congestion.   traMADol 50 MG tablet Commonly known as: ULTRAM Take 50 mg by mouth every 6 (six) hours as needed for moderate pain.   traZODone 50 MG tablet Commonly known as: DESYREL Take 50 mg by mouth at bedtime as needed for sleep.   Trelegy Ellipta 100-62.5-25 MCG/INH Aepb Generic drug: Fluticasone-Umeclidin-Vilant Inhale 1 puff into the lungs daily.       Signature:  Chesley Mires, MD [place] Pager - [phone] 02/14/2020, 3:29 PM

## 2020-02-15 ENCOUNTER — Telehealth: Payer: Self-pay | Admitting: Pulmonary Disease

## 2020-02-15 NOTE — Telephone Encounter (Signed)
Called and spoke with patient about chest ray results per Dr Halford Chessman. All questions answered and patient expressed full understanding. Nothing further needed at this time.

## 2020-02-15 NOTE — Telephone Encounter (Signed)
Called and spoke with patient about Bipap report results per Dr Halford Chessman. All questions answered and patient expressed full understanding. Nothing further needed at this time.

## 2020-02-15 NOTE — Telephone Encounter (Signed)
PFT 12/31/11 >> FEV1 0.40 (18%), FEV15 42  PSG 04/14/05 >> RDI 12.2, SpO2 low 89% Bipap 05/16/14 >> Bipap 15/10 cm H2O with 3 liters O2 ONO with Bipap and O2 06/15/14 >> test time 8 hrs 53 min.  Basal SpO2 91.9%, low SpO2 87%.  Spent 21.3 min with SpO2 < 88% Bipap 01/15/20 to 02/13/20 >> used on 30 of 30 nights with average 12 hrs 14 min.  Average AHI 1.1 with Bipap 14/6 cm H2O   Please let her know that Bipap report shows good control of sleep apnea with current settings.

## 2020-02-15 NOTE — Telephone Encounter (Signed)
DG Chest 2 View  Result Date: 02/15/2020 CLINICAL DATA:  Follow-up pneumonia, shortness of breath, COPD EXAM: CHEST - 2 VIEW COMPARISON:  Chest radiograph dated 11/01/2019. CT chest dated 10/15/2019. FINDINGS: Stable nodular opacity in the lateral right upper lobe, without suspicious nodule on prior CT. Scattered calcified granulomata. Left lower lobe scarring/atelectasis. No focal consolidation. No pleural effusion or pneumothorax. The heart is normal in size. Mild degenerative changes of the visualized thoracolumbar spine. Cervical spine fixation hardware. IMPRESSION: No active cardiopulmonary disease. Electronically Signed   By: Julian Hy M.D.   On: 02/15/2020 12:02     Please let her know that chest xray looks okay.  She has stable changes of emphysema and lung granulomas (which is scarring from prior respiratory infections).  No change to current treatment plan.

## 2020-02-18 ENCOUNTER — Telehealth (INDEPENDENT_AMBULATORY_CARE_PROVIDER_SITE_OTHER): Payer: Self-pay | Admitting: *Deleted

## 2020-02-18 NOTE — Telephone Encounter (Signed)
Patient called on 02/04/2020 to say that she was ready to have Endoscopy whenever Dr.Rehman was ready.  Patient was sen in the office in July. Dr.Rehman had noted that with the patient's throat and chest symptoms he would consider the diagnostic EGD, the patient stated that she was still waffling about the Colonoscopy.. She did go for surgical consultation. She reports that the surgeon was on the same page as Dr.Rehman. She is not a candidate, high risk. Patient states that she is not feeling that its a good idea however there are days that she feels like go a head and do it.  Dr.Rehman ask that the patient take the Pantoprazole 40 mg twice a day for 4 weeks , then we would reevaluate.  Patient was called and made aware.

## 2020-02-21 ENCOUNTER — Other Ambulatory Visit (INDEPENDENT_AMBULATORY_CARE_PROVIDER_SITE_OTHER): Payer: Self-pay | Admitting: Gastroenterology

## 2020-02-21 NOTE — Telephone Encounter (Signed)
[name] - was a prescription sent in? I got request from [facility] for 90 day supply of protonix but wanted to see if if should send Rx to local pharmacy instead. Thanks.

## 2020-02-21 NOTE — Telephone Encounter (Signed)
Great-I will disregard the refill request I got for now and she can call if needs [place] to send in. Thanks.

## 2020-02-21 NOTE — Telephone Encounter (Signed)
Noted  

## 2020-02-21 NOTE — Telephone Encounter (Signed)
No refills called in as the patient stated that she had some. Dr.Rehman states that he wanted the patient to take 2 per day for 4 weeks, then let him know how she was and it would readdressed in regards to the EGD.

## 2020-02-21 NOTE — Telephone Encounter (Signed)
See my note

## 2020-03-11 DIAGNOSIS — Z23 Encounter for immunization: Secondary | ICD-10-CM | POA: Diagnosis not present

## 2020-03-14 DIAGNOSIS — J9611 Chronic respiratory failure with hypoxia: Secondary | ICD-10-CM | POA: Diagnosis not present

## 2020-03-14 DIAGNOSIS — I2510 Atherosclerotic heart disease of native coronary artery without angina pectoris: Secondary | ICD-10-CM | POA: Diagnosis not present

## 2020-03-28 NOTE — Progress Notes (Signed)
Cardiology Office Note  Date: 03/29/2020   ID: [name], DOB 04/15/1956, MRN [id number]  PCP:  Carylon Perches, MD  Cardiologist:  Nona Dell, MD Electrophysiologist:  None   Chief Complaint  Patient presents with  . Cardiac follow-up    History of Present Illness: [name] is a 64 y.o. female last assessed via telehealth encounter in May.  She presents for a routine visit.  No reported sense of increasing palpitations, but she has some had some episodes where she feels lightheaded and unusual for a few minutes.  She has never had frank syncope.  States that her heart rate is as low as the 70s during these episodes which is unusual for her.  Question whether this could be potentially vagally mediated.  She does have chronic back pain and also IBS with intermittent GI symptoms.  I reviewed her medications which are outlined below, no changes from a cardiac perspective.  I personally reviewed her ECG today which shows sinus tachycardia with nonspecific ST changes.  Past Medical History:  Diagnosis Date  . Allergy   . Anxiety   . Arthritis   . COPD (chronic obstructive pulmonary disease) (HCC)    [facility] oxygen at [facility]  . Depression   . Diverticulosis   . History of cardiac catheterization    No significant CAD 2005  . Hyperlipidemia   . Hypoglycemia   . IBS (irritable bowel syndrome)   . Neuromuscular disorder (HCC)   . Osteoporosis   . Pneumonia 2015  . Shingles 2012  . Sleep apnea   . SVT (supraventricular tachycardia) (HCC)     Past Surgical History:  Procedure Laterality Date  . BACK SURGERY    . BREAST SURGERY    . CARDIAC CATHETERIZATION  2004  . COLONOSCOPY  03/22/2011   Procedure: COLONOSCOPY;  Surgeon: Malissa Hippo, MD;  Location: [facility];  Service: Endoscopy;  Laterality: N/A;  . COLONOSCOPY WITH PROPOFOL N/A 05/16/2017   Procedure: COLONOSCOPY WITH PROPOFOL;  Surgeon: Malissa Hippo, MD;  Location: [facility];  Service: Endoscopy;   Laterality: N/A;  . FLEXIBLE SIGMOIDOSCOPY N/A 09/26/2017   Procedure: FLEXIBLE SIGMOIDOSCOPY with Propofol ;  Surgeon: Malissa Hippo, MD;  Location: [facility];  Service: Endoscopy;  Laterality: N/A;  9:30  . POLYPECTOMY  05/16/2017   Procedure: POLYPECTOMY;  Surgeon: Malissa Hippo, MD;  Location: [facility];  Service: Endoscopy;;  . SPINAL FUSION      Current Outpatient Medications  Medication Sig Dispense Refill  . acyclovir (ZOVIRAX) 800 MG tablet Take 800 mg by mouth 5 (five) times daily as needed (for break outs).     . ALPRAZolam (XANAX) 0.25 MG tablet Take 0.25 mg by mouth 3 (three) times daily as needed for anxiety.     Marland Kitchen aspirin 81 MG tablet Take 1 tablet (81 mg total) by mouth daily. 100 tablet 3  . atorvastatin (LIPITOR) 40 MG tablet TAKE (1) TABLET BY MOUTH ONCE DAILY. 90 tablet 3  . carboxymethylcellulose (REFRESH) 1 % ophthalmic solution Place 2 drops into both eyes 3 (three) times daily as needed (for dry eyes).    . cetirizine (ZYRTEC) 10 MG tablet Take 10 mg by mouth daily.      . clonazePAM (KLONOPIN) 0.5 MG tablet Take 1 tablet (0.5 mg total) by mouth at bedtime. 30 tablet 5  . dicyclomine (BENTYL) 10 MG capsule Take 1 capsule (10 mg total) by mouth 3 (three) times daily as  needed for spasms. 270 capsule 3  . diltiazem (CARDIZEM CD) 240 MG 24 hr capsule Take 1 capsule (240 mg total) by mouth daily. 90 capsule 3  . diltiazem (CARDIZEM) 30 MG tablet Take 1-2 tablets every 6 hours as needed for persistent tachycardai 90 tablet 3  . escitalopram (LEXAPRO) 20 MG tablet Take 20 mg by mouth daily.      . Fluticasone-Umeclidin-Vilant (TRELEGY ELLIPTA) 100-62.5-25 MCG/INH AEPB Inhale 1 puff into the lungs daily. 180 each 3  . ibuprofen (ADVIL,MOTRIN) 200 MG tablet Take 400 mg by mouth every 6 (six) hours as needed for moderate pain.    . methocarbamol (ROBAXIN) 500 MG tablet Take 1 tablet (500 mg total) by mouth 2 (two) times daily. 20 tablet 0  . montelukast (SINGULAIR)  10 MG tablet Take 1 tablet (10 mg total) by mouth at bedtime. 30 tablet 1  . ondansetron (ZOFRAN) 4 MG tablet Take 1 tablet (4 mg total) every 8 (eight) hours as needed by mouth for nausea or vomiting. 30 tablet 0  . oxybutynin (DITROPAN) 5 MG tablet Take 5 mg by mouth daily.     . OXYGEN Inhale 4 L into the lungs continuous.    . roflumilast (DALIRESP) 500 MCG TABS tablet Take 500 mcg by mouth daily.      . sodium chloride (OCEAN) 0.65 % SOLN nasal spray Place 2-3 sprays into both nostrils as needed for congestion.    . traMADol (ULTRAM) 50 MG tablet Take 50 mg by mouth every 6 (six) hours as needed for moderate pain.     . traZODone (DESYREL) 50 MG tablet Take 50 mg by mouth at bedtime as needed for sleep.    Marland Kitchen triamcinolone (NASACORT ALLERGY 24HR) 55 MCG/ACT AERO nasal inhaler Place 2 sprays into the nose 2 (two) times daily as needed (for allergies).      No current facility-administered medications for this visit.   Allergies:  Iohexol, Neosporin [neomycin-bacitracin zn-polymyx], Adhesive [tape], Chantix [varenicline], Ketek [telithromycin], and Penicillins   ROS: No frank syncope.  Physical Exam: VS:  BP 140/82   Pulse (!) 110   Ht 5' (1.524 m)   Wt 130 lb (59 kg)   SpO2 100% Comment: on 4 L [employer] O2  BMI 25.39 kg/m , BMI Body mass index is 25.39 kg/m.  Wt Readings from Last 3 Encounters:  03/29/20 130 lb (59 kg)  02/14/20 126 lb 3.2 oz (57.2 kg)  11/18/19 126 lb 8 oz (57.4 kg)    General: Patient appears comfortable at rest.  Wearing oxygen via nasal cannula. HEENT: Conjunctiva and lids normal, wearing a mask. Neck: Supple, no elevated JVP or carotid bruits, no thyromegaly. Lungs: Decreased breath sounds, no active wheezing. Cardiac: Regular rate and rhythm, no S3 or significant systolic murmur, no pericardial rub. Extremities: No pitting edema.  ECG:  An ECG dated 06/30/2018 was personally reviewed today and demonstrated:  Sinus rhythm with nonspecific ST-T  changes.  Recent Labwork: 11/01/2019: ALT 14; AST 14; BUN 9; Creatinine, Ser 0.68; Hemoglobin 12.3; Platelets 354; Potassium 3.8; Sodium 139   Other Studies Reviewed Today:  Echocardiogram2/20/2017: Study Conclusions  - Left ventricle: The cavity size was normal. Wall thickness was normal. Systolic function was normal. The estimated ejection fraction was in the range of 60% to 65%. Wall motion was normal; there were no regional wall motion abnormalities. Doppler parameters are consistent with abnormal left ventricular relaxation (grade 1 diastolic dysfunction). - Left atrium: The atrium was mildly dilated.  Assessment and Plan:  1.  History of PSVT.  She continues on Cardizem CD 240 mg daily with short acting Cardizem for breakthrough palpitations.  She reports intermittent episodes of dizziness and reportedly heart rate in the 70s, could potentially be having intermittent vagal episodes in the setting of chronic back pain and also IBS, but conduction system disease is also possible.  ECG reviewed today.  If these symptoms worsen, we will plan an outpatient cardiac monitor.  She has had no frank syncope.  2.  Severe oxygen dependent COPD.  She continues to follow with Pulmonary.  Medication Adjustments/Labs and Tests Ordered: Current medicines are reviewed at length with the patient today.  Concerns regarding medicines are outlined above.   Tests Ordered: Orders Placed This Encounter  Procedures  . EKG 12-Lead    Medication Changes: No orders of the defined types were placed in this encounter.   Disposition:  Follow up 6 months in the [place] office.  Signed, Jonelle Sidle, MD, [facility] 03/29/2020 1:21 PM    [facility] at [facility] [address], [place], [place] [address] Phone: [phone]; Fax: [phone]

## 2020-03-29 ENCOUNTER — Ambulatory Visit (INDEPENDENT_AMBULATORY_CARE_PROVIDER_SITE_OTHER): Payer: Medicare Other | Admitting: Cardiology

## 2020-03-29 ENCOUNTER — Encounter: Payer: Self-pay | Admitting: Cardiology

## 2020-03-29 ENCOUNTER — Other Ambulatory Visit: Payer: Self-pay

## 2020-03-29 VITALS — BP 140/82 | HR 110 | Ht 60.0 in | Wt 130.0 lb

## 2020-03-29 DIAGNOSIS — I471 Supraventricular tachycardia: Secondary | ICD-10-CM | POA: Diagnosis not present

## 2020-03-29 DIAGNOSIS — J449 Chronic obstructive pulmonary disease, unspecified: Secondary | ICD-10-CM

## 2020-03-29 NOTE — Patient Instructions (Signed)
Medication Instructions:  °Your physician recommends that you continue on your current medications as directed. Please refer to the Current Medication list given to you today. ° °*If you need a refill on your cardiac medications before your next appointment, please call your pharmacy* ° ° °Lab Work: °None today °If you have labs (blood work) drawn today and your tests are completely normal, you will receive your results only by: °• MyChart Message (if you have MyChart) OR °• A paper copy in the mail °If you have any lab test that is abnormal or we need to change your treatment, we will call you to review the results. ° ° °Testing/Procedures: °None today ° ° °Follow-Up: °At [facility], you and your health needs are our priority.  As part of our continuing mission to provide you with exceptional heart care, we have created designated Provider Care Teams.  These Care Teams include your primary Cardiologist (physician) and Advanced Practice Providers (APPs -  Physician Assistants and Nurse Practitioners) who all work together to provide you with the care you need, when you need it. ° °We recommend signing up for the patient portal called "MyChart".  Sign up information is provided on this After Visit Summary.  MyChart is used to connect with patients for Virtual Visits (Telemedicine).  Patients are able to view lab/test results, encounter notes, upcoming appointments, etc.  Non-urgent messages can be sent to your provider as well.   °To learn more about what you can do with MyChart, go to [email or web address].   ° °Your next appointment:   °6 month(s) ° °The format for your next appointment:   °In Person ° °Provider:   °Samuel McDowell, MD ° ° °Other Instructions °None ° ° ° ° °Thank you for choosing [facility] ! ° ° ° ° ° ° ° ° °

## 2020-04-07 ENCOUNTER — Telehealth (INDEPENDENT_AMBULATORY_CARE_PROVIDER_SITE_OTHER): Payer: Self-pay | Admitting: *Deleted

## 2020-04-07 NOTE — Telephone Encounter (Signed)
Patient called office and left a message that she had taken the Pantoprazole as long as she could. This had caused severe diarrhea. She also mentions that the dysphagia has worsened with both foods and medication.She feels that she needs to precede with EGD.  Discussed with Dr.Rehman. he states that we may precede with EGD/D with Propofol. Please contact the patient.

## 2020-04-07 NOTE — Telephone Encounter (Signed)
Noted, thanks!

## 2020-04-12 ENCOUNTER — Other Ambulatory Visit (INDEPENDENT_AMBULATORY_CARE_PROVIDER_SITE_OTHER): Payer: Self-pay

## 2020-04-12 ENCOUNTER — Encounter (INDEPENDENT_AMBULATORY_CARE_PROVIDER_SITE_OTHER): Payer: Self-pay

## 2020-04-12 DIAGNOSIS — Z20822 Contact with and (suspected) exposure to covid-19: Secondary | ICD-10-CM | POA: Diagnosis not present

## 2020-04-12 DIAGNOSIS — R1319 Other dysphagia: Secondary | ICD-10-CM

## 2020-04-17 ENCOUNTER — Encounter (HOSPITAL_COMMUNITY): Payer: Self-pay

## 2020-04-17 ENCOUNTER — Other Ambulatory Visit: Payer: Self-pay

## 2020-04-17 ENCOUNTER — Other Ambulatory Visit (HOSPITAL_COMMUNITY)
Admission: RE | Admit: 2020-04-17 | Discharge: 2020-04-17 | Disposition: A | Discharge status: Home / Self Care | Payer: Medicare Other | Source: Ambulatory Visit | Attending: Internal Medicine | Admitting: Internal Medicine

## 2020-04-17 ENCOUNTER — Encounter (HOSPITAL_COMMUNITY)
Admission: RE | Admit: 2020-04-17 | Discharge: 2020-04-17 | Disposition: A | Discharge status: Home / Self Care | Payer: Medicare Other | Source: Ambulatory Visit | Attending: Internal Medicine | Admitting: Internal Medicine

## 2020-04-17 DIAGNOSIS — Z01812 Encounter for preprocedural laboratory examination: Secondary | ICD-10-CM | POA: Diagnosis not present

## 2020-04-17 DIAGNOSIS — Z20822 Contact with and (suspected) exposure to covid-19: Secondary | ICD-10-CM | POA: Insufficient documentation

## 2020-04-17 DIAGNOSIS — R1319 Other dysphagia: Secondary | ICD-10-CM

## 2020-04-17 NOTE — Patient Instructions (Signed)
[name]  04/17/2020     @PREFPERIOPPHARMACY @   Your procedure is scheduled on 04/19/2020.  Report to [facility] at 7:45 A.M.  Call this number if you have problems the morning of surgery:  [phone]   Remember:  Do not eat or drink after midnight.   Please follow the diet and prep instructions given to you by Dr Olevia Perches office.     Take these medicines the morning of surgery with A SIP OF WATER : Deltiazem Zyrtec, Lexapro, Xanax and Robaxin    Do not wear jewelry, make-up or nail polish.  Do not wear lotions, powders, or perfumes, or deodorant.  Do not shave 48 hours prior to surgery.  Men may shave face and neck.  Do not bring valuables to the hospital.  [facility] is not responsible for any belongings or valuables.  Contacts, dentures or bridgework may not be worn into surgery.  Leave your suitcase in the car.  After surgery it may be brought to your room.  For patients admitted to the hospital, discharge time will be determined by your treatment team.  Patients discharged the day of surgery will not be allowed to drive home.   Name and phone number of your driver:   family Special instructions:  N/a  Please read over the following fact sheets that you were given. Care and Recovery After Surgery     Upper Endoscopy, Adult Upper endoscopy is a procedure to look inside the upper GI (gastrointestinal) tract. The upper GI tract is made up of:  The part of the body that moves food from your mouth to your stomach (esophagus).  The stomach.  The first part of your small intestine (duodenum). This procedure is also called esophagogastroduodenoscopy (EGD) or gastroscopy. In this procedure, your health care provider passes a thin, flexible tube (endoscope) through your mouth and down your esophagus into your stomach. A small camera is attached to the end of the tube. Images from the camera appear on a monitor in the exam room. During this procedure, your health  care provider may also remove a small piece of tissue to be sent to a lab and examined under a microscope (biopsy). Your health care provider may do an upper endoscopy to diagnose cancers of the upper GI tract. You may also have this procedure to find the cause of other conditions, such as:  Stomach pain.  Heartburn.  Pain or problems when swallowing.  Nausea and vomiting.  Stomach bleeding.  Stomach ulcers. Tell a health care provider about:  Any allergies you have.  All medicines you are taking, including vitamins, herbs, eye drops, creams, and over-the-counter medicines.  Any problems you or family members have had with anesthetic medicines.  Any blood disorders you have.  Any surgeries you have had.  Any medical conditions you have.  Whether you are pregnant or may be pregnant. What are the risks? Generally, this is a safe procedure. However, problems may occur, including:  Infection.  Bleeding.  Allergic reactions to medicines.  A tear or hole (perforation) in the esophagus, stomach, or duodenum. What happens before the procedure? Staying hydrated Follow instructions from your health care provider about hydration, which may include:  Up to 2 hours before the procedure - you may continue to drink clear liquids, such as water, clear fruit juice, black coffee, and plain tea.  Eating and drinking restrictions Follow instructions from your health care provider about eating and drinking, which may include:  8 hours  before the procedure - stop eating heavy meals or foods, such as meat, fried foods, or fatty foods.  6 hours before the procedure - stop eating light meals or foods, such as toast or cereal.  6 hours before the procedure - stop drinking milk or drinks that contain milk.  2 hours before the procedure - stop drinking clear liquids. Medicines Ask your health care provider about:  Changing or stopping your regular medicines. This is especially important  if you are taking diabetes medicines or blood thinners.  Taking medicines such as aspirin and ibuprofen. These medicines can thin your blood. Do not take these medicines unless your health care provider tells you to take them.  Taking over-the-counter medicines, vitamins, herbs, and supplements. General instructions  Plan to have someone take you home from the hospital or clinic.  If you will be going home right after the procedure, plan to have someone with you for 24 hours.  Ask your health care provider what steps will be taken to help prevent infection. What happens during the procedure?   An IV will be inserted into one of your veins.  You may be given one or more of the following: ? A medicine to help you relax (sedative). ? A medicine to numb the throat (local anesthetic).  You will lie on your left side on an exam table.  Your health care provider will pass the endoscope through your mouth and down your esophagus.  Your health care provider will use the scope to check the inside of your esophagus, stomach, and duodenum. Biopsies may be taken.  The endoscope will be removed. The procedure may vary among health care providers and hospitals. What happens after the procedure?  Your blood pressure, heart rate, breathing rate, and blood oxygen level will be monitored until you leave the hospital or clinic.  Do not drive for 24 hours if you were given a sedative during your procedure.  When your throat is no longer numb, you may be given some fluids to drink.  It is up to you to get the results of your procedure. Ask your health care provider, or the department that is doing the procedure, when your results will be ready. Summary  Upper endoscopy is a procedure to look inside the upper GI tract.  During the procedure, an IV will be inserted into one of your veins. You may be given a medicine to help you relax.  A medicine will be used to numb your throat.  The endoscope  will be passed through your mouth and down your esophagus. This information is not intended to replace advice given to you by your health care provider. Make sure you discuss any questions you have with your health care provider. Document Revised: 10/15/2017 Document Reviewed: 09/22/2017 Elsevier Patient Education  [place]. Colonoscopy, Adult A colonoscopy is a procedure to look at the entire large intestine. This procedure is done using a long, thin, flexible tube that has a camera on the end. You may have a colonoscopy:  As a part of normal colorectal screening.  If you have certain symptoms, such as: ? A low number of red blood cells in your blood (anemia). ? Diarrhea that does not go away. ? Pain in your abdomen. ? Blood in your stool. A colonoscopy can help screen for and diagnose medical problems, including:  Tumors.  Extra tissue that grows where mucus forms (polyps).  Inflammation.  Areas of bleeding. Tell your health care provider about:  Any allergies you have.  All medicines you are taking, including vitamins, herbs, eye drops, creams, and over-the-counter medicines.  Any problems you or family members have had with anesthetic medicines.  Any blood disorders you have.  Any surgeries you have had.  Any medical conditions you have.  Any problems you have had with having bowel movements.  Whether you are pregnant or may be pregnant. What are the risks? Generally, this is a safe procedure. However, problems may occur, including:  Bleeding.  Damage to your intestine.  Allergic reactions to medicines given during the procedure.  Infection. This is rare. What happens before the procedure? Eating and drinking restrictions Follow instructions from your health care provider about eating or drinking restrictions, which may include:  A few days before the procedure: ? Follow a low-fiber diet. ? Avoid nuts, seeds, dried fruit, raw fruits, and  vegetables.  1-3 days before the procedure: ? Eat only gelatin dessert or ice pops. ? Drink only clear liquids, such as water, clear juice, clear broth or bouillon, black coffee or tea, or clear soft drinks or sports drinks. ? Avoid liquids that contain red or purple dye.  The day of the procedure: ? Do not eat solid foods. You may continue to drink clear liquids until up to 2 hours before the procedure. ? Do not eat or drink anything starting 2 hours before the procedure, or within the time period that your health care provider recommends. Bowel prep If you were prescribed a bowel prep to take by mouth (orally) to clean out your colon:  Take it as told by your health care provider. Starting the day before your procedure, you will need to drink a large amount of liquid medicine. The liquid will cause you to have many bowel movements of loose stool until your stool becomes almost clear or light green.  If your skin or the opening between the buttocks (anus) gets irritated from diarrhea, you may relieve the irritation using: ? Wipes with medicine in them, such as adult wet wipes with aloe and vitamin E. ? A product to soothe skin, such as petroleum jelly.  If you vomit while drinking the bowel prep: ? Take a break for up to 60 minutes. ? Begin the bowel prep again. ? Call your health care provider if you keep vomiting or you cannot take the bowel prep without vomiting.  To clean out your colon, you may also be given: ? Laxative medicines. These help you have a bowel movement. ? Instructions for enema use. An enema is liquid medicine injected into your rectum. Medicines Ask your health care provider about:  Changing or stopping your regular medicines or supplements. This is especially important if you are taking iron supplements, diabetes medicines, or blood thinners.  Taking medicines such as aspirin and ibuprofen. These medicines can thin your blood. Do not take these medicines unless  your health care provider tells you to take them.  Taking over-the-counter medicines, vitamins, herbs, and supplements. General instructions  Ask your health care provider what steps will be taken to help prevent infection. These may include washing skin with a germ-killing soap.  Plan to have someone take you home from the hospital or clinic. What happens during the procedure?   An IV will be inserted into one of your veins.  You may be given one or more of the following: ? A medicine to help you relax (sedative). ? A medicine to numb the area (local anesthetic). ? A medicine to  make you fall asleep (general anesthetic). This is rarely needed.  You will lie on your side with your knees bent.  The tube will: ? Have oil or gel put on it (be lubricated). ? Be inserted into your anus. ? Be gently eased through all parts of your large intestine.  Air will be sent into your colon to keep it open. This may cause some pressure or cramping.  Images will be taken with the camera and will appear on a screen.  A small tissue sample may be removed to be looked at under a microscope (biopsy). The tissue may be sent to a lab for testing if any signs of problems are found.  If small polyps are found, they may be removed and checked for cancer cells.  When the procedure is finished, the tube will be removed. The procedure may vary among health care providers and hospitals. What happens after the procedure?  Your blood pressure, heart rate, breathing rate, and blood oxygen level will be monitored until you leave the hospital or clinic.  You may have a small amount of blood in your stool.  You may pass gas and have mild cramping or bloating in your abdomen. This is caused by the air that was used to open your colon during the exam.  Do not drive for 24 hours after the procedure.  It is up to you to get the results of your procedure. Ask your health care provider, or the department that is  doing the procedure, when your results will be ready. Summary  A colonoscopy is a procedure to look at the entire large intestine.  Follow instructions from your health care provider about eating and drinking before the procedure.  If you were prescribed an oral bowel prep to clean out your colon, take it as told by your health care provider.  During the colonoscopy, a flexible tube with a camera on its end is inserted into the anus and then passed into the other parts of the large intestine. This information is not intended to replace advice given to you by your health care provider. Make sure you discuss any questions you have with your health care provider. Document Revised: 11/13/2018 Document Reviewed: 11/13/2018 Elsevier Patient Education  [place].

## 2020-04-18 LAB — SARS CORONAVIRUS 2 (TAT 6-24 HRS): SARS Coronavirus 2: NEGATIVE

## 2020-04-19 ENCOUNTER — Ambulatory Visit (HOSPITAL_COMMUNITY): Payer: Medicare Other | Admitting: Certified Registered Nurse Anesthetist

## 2020-04-19 ENCOUNTER — Ambulatory Visit (HOSPITAL_COMMUNITY)
Admission: RE | Admit: 2020-04-19 | Discharge: 2020-04-19 | Disposition: A | Discharge status: Home / Self Care | Payer: Medicare Other | Attending: Internal Medicine | Admitting: Internal Medicine

## 2020-04-19 ENCOUNTER — Encounter (HOSPITAL_COMMUNITY): Payer: Self-pay | Admitting: Internal Medicine

## 2020-04-19 ENCOUNTER — Encounter (HOSPITAL_COMMUNITY)
Admission: RE | Disposition: A | Discharge status: Home / Self Care | Payer: Self-pay | Source: Home / Self Care | Attending: Internal Medicine

## 2020-04-19 ENCOUNTER — Other Ambulatory Visit: Payer: Self-pay

## 2020-04-19 DIAGNOSIS — J449 Chronic obstructive pulmonary disease, unspecified: Secondary | ICD-10-CM | POA: Insufficient documentation

## 2020-04-19 DIAGNOSIS — R1314 Dysphagia, pharyngoesophageal phase: Secondary | ICD-10-CM

## 2020-04-19 DIAGNOSIS — Z888 Allergy status to other drugs, medicaments and biological substances status: Secondary | ICD-10-CM | POA: Diagnosis not present

## 2020-04-19 DIAGNOSIS — Z880 Allergy status to penicillin: Secondary | ICD-10-CM | POA: Diagnosis not present

## 2020-04-19 DIAGNOSIS — K449 Diaphragmatic hernia without obstruction or gangrene: Secondary | ICD-10-CM

## 2020-04-19 DIAGNOSIS — R1319 Other dysphagia: Secondary | ICD-10-CM

## 2020-04-19 DIAGNOSIS — Z9981 Dependence on supplemental oxygen: Secondary | ICD-10-CM | POA: Insufficient documentation

## 2020-04-19 DIAGNOSIS — R1310 Dysphagia, unspecified: Secondary | ICD-10-CM | POA: Diagnosis not present

## 2020-04-19 DIAGNOSIS — K208 Other esophagitis without bleeding: Secondary | ICD-10-CM | POA: Diagnosis not present

## 2020-04-19 DIAGNOSIS — Q402 Other specified congenital malformations of stomach: Secondary | ICD-10-CM | POA: Diagnosis not present

## 2020-04-19 DIAGNOSIS — K319 Disease of stomach and duodenum, unspecified: Secondary | ICD-10-CM | POA: Insufficient documentation

## 2020-04-19 DIAGNOSIS — K2970 Gastritis, unspecified, without bleeding: Secondary | ICD-10-CM | POA: Insufficient documentation

## 2020-04-19 DIAGNOSIS — Z87891 Personal history of nicotine dependence: Secondary | ICD-10-CM | POA: Diagnosis not present

## 2020-04-19 DIAGNOSIS — Z7951 Long term (current) use of inhaled steroids: Secondary | ICD-10-CM | POA: Diagnosis not present

## 2020-04-19 DIAGNOSIS — K269 Duodenal ulcer, unspecified as acute or chronic, without hemorrhage or perforation: Secondary | ICD-10-CM | POA: Insufficient documentation

## 2020-04-19 DIAGNOSIS — Z7982 Long term (current) use of aspirin: Secondary | ICD-10-CM | POA: Diagnosis not present

## 2020-04-19 DIAGNOSIS — K3189 Other diseases of stomach and duodenum: Secondary | ICD-10-CM | POA: Diagnosis not present

## 2020-04-19 DIAGNOSIS — K2289 Other specified disease of esophagus: Secondary | ICD-10-CM | POA: Insufficient documentation

## 2020-04-19 DIAGNOSIS — Z79899 Other long term (current) drug therapy: Secondary | ICD-10-CM | POA: Diagnosis not present

## 2020-04-19 DIAGNOSIS — G4733 Obstructive sleep apnea (adult) (pediatric): Secondary | ICD-10-CM | POA: Diagnosis not present

## 2020-04-19 HISTORY — PX: ESOPHAGEAL DILATION: SHX303

## 2020-04-19 HISTORY — PX: ESOPHAGOGASTRODUODENOSCOPY (EGD) WITH PROPOFOL: SHX5813

## 2020-04-19 SURGERY — ESOPHAGOGASTRODUODENOSCOPY (EGD) WITH PROPOFOL
Anesthesia: General

## 2020-04-19 MED ORDER — LACTATED RINGERS IV SOLN: INTRAVENOUS | Status: DC

## 2020-04-19 MED ORDER — ACETAMINOPHEN 325 MG PO TABS
ORAL_TABLET | ORAL | Status: AC
  Filled 2020-04-19: qty 2

## 2020-04-19 MED ORDER — LIDOCAINE HCL (CARDIAC) PF 100 MG/5ML IV SOSY
PREFILLED_SYRINGE | INTRAVENOUS | Status: DC | PRN
Start: 2020-04-19 — End: 2020-04-19
  Administered 2020-04-19: 50 mg via INTRATRACHEAL

## 2020-04-19 MED ORDER — LANSOPRAZOLE 15 MG PO CPDR: 15.0000 mg | DELAYED_RELEASE_CAPSULE | Freq: Two times a day (BID) | ORAL | Status: DC

## 2020-04-19 MED ORDER — GLYCOPYRROLATE 0.2 MG/ML IJ SOLN
INTRAMUSCULAR | Status: AC
  Filled 2020-04-19: qty 1

## 2020-04-19 MED ORDER — KETAMINE HCL 50 MG/5ML IJ SOSY
PREFILLED_SYRINGE | INTRAMUSCULAR | Status: AC
  Filled 2020-04-19: qty 5

## 2020-04-19 MED ORDER — KETAMINE HCL 10 MG/ML IJ SOLN
INTRAMUSCULAR | Status: DC | PRN
  Administered 2020-04-19: 20 mg via INTRAVENOUS

## 2020-04-19 MED ORDER — LIDOCAINE VISCOUS HCL 2 % MT SOLN
OROMUCOSAL | Status: AC
  Filled 2020-04-19: qty 15

## 2020-04-19 MED ORDER — LIDOCAINE VISCOUS HCL 2 % MT SOLN
15.0000 mL | Freq: Once | OROMUCOSAL | Status: AC
  Administered 2020-04-19: 09:00:00 15 mL via OROMUCOSAL

## 2020-04-19 MED ORDER — ACETAMINOPHEN 325 MG PO TABS
650.0000 mg | ORAL_TABLET | Freq: Four times a day (QID) | ORAL | Status: DC | PRN
  Administered 2020-04-19: 650 mg via ORAL

## 2020-04-19 MED ORDER — GLYCOPYRROLATE 0.2 MG/ML IJ SOLN
0.2000 mg | Freq: Once | INTRAMUSCULAR | Status: AC
  Administered 2020-04-19: 09:00:00 0.2 mg via INTRAVENOUS

## 2020-04-19 MED ORDER — LACTATED RINGERS IV SOLN: INTRAVENOUS | Status: DC | PRN

## 2020-04-19 MED ORDER — PROPOFOL 10 MG/ML IV BOLUS
INTRAVENOUS | Status: AC
  Filled 2020-04-19: qty 200

## 2020-04-19 MED ORDER — PROPOFOL 500 MG/50ML IV EMUL
INTRAVENOUS | Status: DC | PRN
  Administered 2020-04-19: 150 ug/kg/min via INTRAVENOUS

## 2020-04-19 NOTE — H&P (Signed)
[name] is an 64 y.o. female.   Chief Complaint: Patient is here for esophagogastroduodenoscopy and esophageal dilation. HPI: Patient is 64 year old Caucasian female with advanced O2 dependent COPD who experienced episode of food impaction 2 weeks ago.  She had difficulty with meat and pills.  She points to upper sternal area site of bolus obstruction.  She has been careful since then and has not had any problems.  She is afraid that would happen again.  She denies heartburn.  She is maintaining her weight.  Her appetite is fair.  She does complain of intermittent epigastric pain.  She has been off aspirin for 3 days.  Past Medical History:  Diagnosis Date  . Allergy   . Anxiety   . Arthritis   . COPD (chronic obstructive pulmonary disease) (HCC)    Home oxygen at [facility]  . Depression   . Diverticulosis   . History of cardiac catheterization    No significant CAD 2005  . Hyperlipidemia   . Hypoglycemia   . IBS (irritable bowel syndrome)   . Neuromuscular disorder ([place])   . Osteoporosis   . Pneumonia 2015  . Shingles 2012  . Sleep apnea   . SVT (supraventricular tachycardia) (HCC)     Past Surgical History:  Procedure Laterality Date  . BACK SURGERY    . BREAST SURGERY    . CARDIAC CATHETERIZATION  2004  . COLONOSCOPY  03/22/2011   Procedure: COLONOSCOPY;  Surgeon: Rogene Houston, MD;  Location: [facility];  Service: Endoscopy;  Laterality: N/A;  . COLONOSCOPY WITH PROPOFOL N/A 05/16/2017   Procedure: COLONOSCOPY WITH PROPOFOL;  Surgeon: Rogene Houston, MD;  Location: [facility];  Service: Endoscopy;  Laterality: N/A;  . FLEXIBLE SIGMOIDOSCOPY N/A 09/26/2017   Procedure: FLEXIBLE SIGMOIDOSCOPY with Propofol ;  Surgeon: Rogene Houston, MD;  Location: [facility];  Service: Endoscopy;  Laterality: N/A;  9:30  . POLYPECTOMY  05/16/2017   Procedure: POLYPECTOMY;  Surgeon: Rogene Houston, MD;  Location: [facility];  Service: Endoscopy;;  . SPINAL FUSION       Family History  Problem Relation Age of Onset  . Asthma Mother   . Heart attack Mother   . Diabetes Mother   . Heart failure Mother   . Colon cancer Neg Hx    Social History:  reports that she quit smoking about 4 years ago. Her smoking use included cigarettes. She has a 40.00 pack-year smoking history. She has never used smokeless tobacco. She reports current alcohol use. She reports that she does not use drugs.  Allergies:  Allergies  Allergen Reactions  . Iohexol Anaphylaxis and Other (See Comments)     Desc: IV DYE  ANAPHYLATIC SHOCK X2 ONCE AFTER PREMEDS   . Neosporin [Neomycin-Bacitracin Zn-Polymyx] Itching  . Adhesive [Tape] Itching and Other (See Comments)    Break out  . Chantix [Varenicline] Other (See Comments)    PALPITATIONS  . Ketek [Telithromycin] Other (See Comments)    Loopy, eyes went different directions  . Penicillins Other (See Comments)    Unknown Has patient had a PCN reaction causing immediate rash, facial/tongue/throat swelling, SOB or lightheadedness with hypotension: Unknown Has patient had a PCN reaction causing severe rash involving mucus membranes or skin necrosis: Unknown Has patient had a PCN reaction that required hospitalization: Unknown Has patient had a PCN reaction occurring within the last 10 years: No If all of the above answers are "NO", then may proceed with Cephalosporin use.  Medications Prior to Admission  Medication Sig Dispense Refill  . ALPRAZolam (XANAX) 0.25 MG tablet Take 0.25 mg by mouth 3 (three) times daily as needed for anxiety.     Marland Kitchen atorvastatin (LIPITOR) 40 MG tablet TAKE (1) TABLET BY MOUTH ONCE DAILY. (Patient taking differently: Take 40 mg by mouth at bedtime. TAKE (1) TABLET BY MOUTH ONCE DAILY.) 90 tablet 3  . carboxymethylcellulose 1 % ophthalmic solution Place 2 drops into both eyes 3 (three) times daily as needed (for dry eyes).    . cetirizine (ZYRTEC) 10 MG tablet Take 10 mg by mouth daily.    .  clonazePAM (KLONOPIN) 0.5 MG tablet Take 1 tablet (0.5 mg total) by mouth at bedtime. (Patient taking differently: Take 0.5 mg by mouth at bedtime as needed for anxiety.) 30 tablet 5  . dicyclomine (BENTYL) 10 MG capsule Take 1 capsule (10 mg total) by mouth 3 (three) times daily as needed for spasms. 270 capsule 3  . diltiazem (CARDIZEM CD) 240 MG 24 hr capsule Take 1 capsule (240 mg total) by mouth daily. 90 capsule 3  . escitalopram (LEXAPRO) 20 MG tablet Take 20 mg by mouth daily.    . fluticasone (FLONASE) 50 MCG/ACT nasal spray Place 1 spray into both nostrils daily.    . Fluticasone-Umeclidin-Vilant (TRELEGY ELLIPTA) 100-62.5-25 MCG/INH AEPB Inhale 1 puff into the lungs daily. 180 each 3  . ibuprofen (ADVIL,MOTRIN) 200 MG tablet Take 400 mg by mouth every 6 (six) hours as needed for moderate pain.    . montelukast (SINGULAIR) 10 MG tablet Take 1 tablet (10 mg total) by mouth at bedtime. 30 tablet 1  . oxybutynin (DITROPAN) 5 MG tablet Take 5 mg by mouth daily.     . OXYGEN Inhale 4 L into the lungs continuous.    . roflumilast (DALIRESP) 500 MCG TABS tablet Take 500 mcg by mouth daily.    . sodium chloride (OCEAN) 0.65 % SOLN nasal spray Place 2-3 sprays into both nostrils as needed for congestion.    . traMADol (ULTRAM) 50 MG tablet Take 50 mg by mouth every 6 (six) hours as needed for moderate pain.     . traZODone (DESYREL) 50 MG tablet Take 50 mg by mouth at bedtime as needed for sleep.    Marland Kitchen acyclovir (ZOVIRAX) 800 MG tablet Take 800 mg by mouth 5 (five) times daily as needed (for break outs).     Marland Kitchen aspirin 81 MG tablet Take 1 tablet (81 mg total) by mouth daily. 100 tablet 3  . diltiazem (CARDIZEM) 30 MG tablet Take 1-2 tablets every 6 hours as needed for persistent tachycardai 90 tablet 3  . methocarbamol (ROBAXIN) 500 MG tablet Take 1 tablet (500 mg total) by mouth 2 (two) times daily. (Patient taking differently: Take 500 mg by mouth 2 (two) times daily as needed for muscle spasms.)  20 tablet 0  . ondansetron (ZOFRAN) 4 MG tablet Take 1 tablet (4 mg total) every 8 (eight) hours as needed by mouth for nausea or vomiting. 30 tablet 0    Results for orders placed or performed during the hospital encounter of 04/17/20 (from the past 48 hour(s))  SARS CORONAVIRUS 2 (TAT 6-24 HRS) Nasopharyngeal Nasopharyngeal Swab     Status: None   Collection Time: 04/17/20  1:26 PM   Specimen: Nasopharyngeal Swab  Result Value Ref Range   SARS Coronavirus 2 NEGATIVE NEGATIVE    Comment: (NOTE) SARS-CoV-2 target nucleic acids are NOT DETECTED.  The SARS-CoV-2 RNA is  generally detectable in upper and lower respiratory specimens during the acute phase of infection. Negative results do not preclude SARS-CoV-2 infection, do not rule out co-infections with other pathogens, and should not be used as the sole basis for treatment or other patient management decisions. Negative results must be combined with clinical observations, patient history, and epidemiological information. The expected result is Negative.  Fact Sheet for Patients: SugarRoll.be  Fact Sheet for Healthcare Providers: https://www.woods-mathews.com/  This test is not yet approved or cleared by the [place] FDA and  has been authorized for detection and/or diagnosis of SARS-CoV-2 by FDA under an Emergency Use Authorization (EUA). This EUA will remain  in effect (meaning this test can be used) for the duration of the COVID-19 declaration under Se ction 564(b)(1) of the Act, 21 U.S.C. section 360bbb-3(b)(1), unless the authorization is terminated or revoked sooner.  Performed at [facility], [place] [address] [place], [place] [address]    No results found.  Review of Systems  Blood pressure 135/79, pulse 81, temperature 98.6 F (37 C), temperature source Oral, resp. rate (!) 22, SpO2 98 %. Physical Exam HENT:     Mouth/Throat:     Mouth: Mucous membranes are  moist.     Pharynx: Oropharynx is clear.     Comments: She has double uvula. Cardiovascular:     Rate and Rhythm: Normal rate and regular rhythm.     Heart sounds: Normal heart sounds. No murmur heard.   Pulmonary:     Comments: Chest AP diameter is increased.  Breath sounds are diminished bilaterally. Abdominal:     General: There is no distension.     Palpations: Abdomen is soft. There is no mass.     Tenderness: There is no abdominal tenderness.  Musculoskeletal:        General: No swelling.     Cervical back: Neck supple.  Lymphadenopathy:     Cervical: No cervical adenopathy.  Skin:    General: Skin is dry.      Assessment/Plan  Recent onset of dysphagia and episode of food and pill impaction Esophago-gastroduodenoscopy with esophageal dilation.  Hildred Laser, MD 04/19/2020, 9:26 AM

## 2020-04-19 NOTE — Anesthesia Preprocedure Evaluation (Signed)
Anesthesia Evaluation  Patient identified by MRN, date of birth, ID band Patient awake    Reviewed: Allergy & Precautions, H&P , NPO status , Patient's Chart, lab work & pertinent test results, reviewed documented beta blocker date and time   Airway Mallampati: II  TM Distance: >3 FB Neck ROM: full    Dental no notable dental hx.    Pulmonary sleep apnea and Oxygen sleep apnea , pneumonia, COPD,  COPD inhaler and oxygen dependent, former smoker,    Pulmonary exam normal breath sounds clear to auscultation       Cardiovascular Exercise Tolerance: Good negative cardio ROS   Rhythm:regular Rate:Normal     Neuro/Psych PSYCHIATRIC DISORDERS Anxiety Depression  Neuromuscular disease    GI/Hepatic negative GI ROS, Neg liver ROS,   Endo/Other  negative endocrine ROS  Renal/GU negative Renal ROS  negative genitourinary   Musculoskeletal   Abdominal   Peds  Hematology  (+) Blood dyscrasia, anemia ,   Anesthesia Other Findings   Reproductive/Obstetrics negative OB ROS                             Anesthesia Physical Anesthesia Plan  ASA: IV  Anesthesia Plan: General   Post-op Pain Management:    Induction:   PONV Risk Score and Plan: Propofol infusion  Airway Management Planned:   Additional Equipment:   Intra-op Plan:   Post-operative Plan:   Informed Consent: I have reviewed the patients History and Physical, chart, labs and discussed the procedure including the risks, benefits and alternatives for the proposed anesthesia with the patient or authorized representative who has indicated his/her understanding and acceptance.     Dental Advisory Given  Plan Discussed with: CRNA  Anesthesia Plan Comments:         Anesthesia Quick Evaluation

## 2020-04-19 NOTE — Op Note (Signed)
[facility] Patient Name: [name] Procedure Date: 04/19/2020 9:05 AM MRN: [id number] Date of Birth: 1955-11-14 Attending MD: Lionel December , MD CSN: [id number] Age: 64 Admit Type: Outpatient Procedure:                Upper GI endoscopy Indications:              Esophageal dysphagia Providers:                Lionel December, MD, Criselda Peaches. Patsy Lager, RN, Edythe Clarity, Technician, Judeth Cornfield. Jessee Avers, Technician Referring MD:             Carylon Perches, MD Medicines:                Propofol per Anesthesia Complications:            No immediate complications. Estimated Blood Loss:     Estimated blood loss was minimal. Procedure:                Pre-Anesthesia Assessment:                           - Prior to the procedure, a History and Physical                            was performed, and patient medications and                            allergies were reviewed. The patient's tolerance of                            previous anesthesia was also reviewed. The risks                            and benefits of the procedure and the sedation                            options and risks were discussed with the patient.                            All questions were answered, and informed consent                            was obtained. Prior Anticoagulants: The patient has                            taken no previous anticoagulant or antiplatelet                            agents except for aspirin and has taken no previous                            anticoagulant or antiplatelet agents except for  NSAID medication. ASA Grade Assessment: IV - A                            patient with severe systemic disease that is a                            constant threat to life. After reviewing the risks                            and benefits, the patient was deemed in                            satisfactory condition to undergo the procedure.                            After obtaining informed consent, the endoscope was                            passed under direct vision. Throughout the                            procedure, the patient's blood pressure, pulse, and                            oxygen saturations were monitored continuously. The                            Endoscope was introduced through the mouth, and                            advanced to the second part of duodenum. The upper                            GI endoscopy was accomplished without difficulty.                            The patient tolerated the procedure well. Scope In: 9:38:28 AM Scope Out: 9:56:55 AM Total Procedure Duration: 0 hours 18 minutes 27 seconds  Findings:      The hypopharynx was normal.      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      The exam of the esophagus was otherwise normal.      The Z-line was irregular and was found 39 cm from the incisors. Biopsies       were taken with a cold forceps for histology. The pathology specimen was       placed into Bottle Number 2.      A 2 cm hiatal hernia was present.      No endoscopic abnormality was evident in the esophagus to explain the       patient's complaint of dysphagia. It was decided, however, to proceed       with dilation of the entire esophagus. The scope was withdrawn. Dilation       was performed with a Maloney dilator with mild resistance at 54 Fr. The  dilation site was examined following endoscope reinsertion and showed no       change and no bleeding, mucosal tear or perforation.      Patchy mild inflammation characterized by congestion (edema), erosions       and erythema was found in the gastric antrum. Biopsies were taken with a       cold forceps for histology. The pathology specimen was placed into       Bottle Number 1.      The exam of the stomach was otherwise normal.      One non-bleeding cratered duodenal ulcer with no stigmata of  bleeding       was found in the duodenal bulb. The lesion was four mm by six mm in       largest dimension.      The second portion of the duodenum was normal. Impression:               - Normal hypopharynx.                           - Ectopic gastric mucosa in the proximal esophagus.                           - Z-line irregular, 39 cm from the incisors.                            Biopsied.                           - 2 cm hiatal hernia.                           - No endoscopic esophageal abnormality to explain                            patient's dysphagia. Esophagus dilated. Dilated.                           - Gastritis. Biopsied.                           - Non-bleeding duodenal ulcer with no stigmata of                            bleeding.                           - Normal second portion of the duodenum. Moderate Sedation:      Per Anesthesia Care Recommendation:           - Patient has a contact number available for                            emergencies. The signs and symptoms of potential                            delayed complications were discussed with the  patient. Return to normal activities tomorrow.                            Written discharge instructions were provided to the                            patient.                           - Resume previous diet today.                           - Continue present medications.                           - Lansoprazole 15 mg by mouth twice daily                           - Resume aspirin at prior dose in 3 days.                           - Await pathology results. Procedure Code(s):        --- Professional ---                           [phone], Esophagogastroduodenoscopy, flexible,                            transoral; with biopsy, single or multiple                           43450, Dilation of esophagus, by unguided sound or                            bougie, single or multiple passes Diagnosis  Code(s):        --- Professional ---                           Q40.2, Other specified congenital malformations of                            stomach                           K22.8, Other specified diseases of esophagus                           K44.9, Diaphragmatic hernia without obstruction or                            gangrene                           K29.70, Gastritis, unspecified, without bleeding                           K26.9, Duodenal ulcer, unspecified as acute or  chronic, without hemorrhage or perforation                           R13.14, Dysphagia, pharyngoesophageal phase CPT copyright 2019 American Medical Association. All rights reserved. The codes documented in this report are preliminary and upon coder review may  be revised to meet current compliance requirements. Lionel December, MD Lionel December, MD 04/19/2020 10:10:33 AM This report has been signed electronically. Number of Addenda: 0

## 2020-04-19 NOTE — Anesthesia Postprocedure Evaluation (Signed)
Anesthesia Post Note  Patient: [name]  Procedure(s) Performed: ESOPHAGOGASTRODUODENOSCOPY (EGD) WITH PROPOFOL (N/A ) ESOPHAGEAL DILATION (N/A )  Patient location during evaluation: PACU Anesthesia Type: General Level of consciousness: awake and alert Pain management: pain level controlled Vital Signs Assessment: post-procedure vital signs reviewed and stable Respiratory status: spontaneous breathing Cardiovascular status: stable Postop Assessment: no apparent nausea or vomiting Anesthetic complications: no   No complications documented.   Last Vitals:  Vitals:   04/19/20 0905 04/19/20 1005  BP: 135/79 134/70  Pulse:  (!) 117  Resp:  (!) 24  Temp:    SpO2: 98%     Last Pain:  Vitals:   04/19/20 0934  TempSrc:   PainSc: [place]

## 2020-04-19 NOTE — Transfer of Care (Signed)
Immediate Anesthesia Transfer of Care Note  Patient: NAKYA WEYAND  Procedure(s) Performed: ESOPHAGOGASTRODUODENOSCOPY (EGD) WITH PROPOFOL (N/A ) ESOPHAGEAL DILATION (N/A )  Patient Location: PACU  Anesthesia Type:General  Level of Consciousness: awake  Airway & Oxygen Therapy: Patient Spontanous Breathing  Post-op Assessment: Report given to RN and Post -op Vital signs reviewed and stable  Post vital signs: Reviewed and stable  Last Vitals:  Vitals Value Taken Time  BP 134/70 04/19/20 1005  Temp    Pulse 113 04/19/20 1006  Resp 19 04/19/20 1006  SpO2 90 % 04/19/20 1006  Vitals shown include unvalidated device data.  Last Pain:  Vitals:   04/19/20 0934  TempSrc:   PainSc: 3          Complications: No complications documented.

## 2020-04-19 NOTE — Discharge Instructions (Signed)
Please do not take Advil Aleve or similar medications Resume aspirin on 04/22/2020 Prevacid OTC 15 mg by mouth 30 minutes before breakfast and evening meal daily. Resume other medications and diet as before. No driving for 24 hours. Physician will call with biopsy results.       Upper Endoscopy, Adult, Care After This sheet gives you information about how to care for yourself after your procedure. Your health care provider may also give you more specific instructions. If you have problems or questions, contact your health care provider. What can I expect after the procedure? After the procedure, it is common to have:  A sore throat.  Mild stomach pain or discomfort.  Bloating.  Nausea. Follow these instructions at home:   Follow instructions from your health care provider about what to eat or drink after your procedure.  Return to your normal activities as told by your health care provider. Ask your health care provider what activities are safe for you.  Take over-the-counter and prescription medicines only as told by your health care provider.  Do not drive for 24 hours if you were given a sedative during your procedure.  Keep all follow-up visits as told by your health care provider. This is important. Contact a health care provider if you have:  A sore throat that lasts longer than one day.  Trouble swallowing. Get help right away if:  You vomit blood or your vomit looks like coffee grounds.  You have: ? A fever. ? Bloody, black, or tarry stools. ? A severe sore throat or you cannot swallow. ? Difficulty breathing. ? Severe pain in your chest or abdomen. Summary  After the procedure, it is common to have a sore throat, mild stomach discomfort, bloating, and nausea.  Do not drive for 24 hours if you were given a sedative during the procedure.  Follow instructions from your health care provider about what to eat or drink after your procedure.  Return to  your normal activities as told by your health care provider. This information is not intended to replace advice given to you by your health care provider. Make sure you discuss any questions you have with your health care provider. Document Revised: 10/14/2017 Document Reviewed: 09/22/2017 Elsevier Patient Education  [place].    Lansoprazole capsules What is this medicine? LANSOPRAZOLE (lan SOE pra zole) prevents the production of acid in the stomach. It is used to treat gastroesophageal reflux disease (GERD), ulcers, certain bacteria in the stomach, inflammation of the esophagus, and Zollinger-Ellison Syndrome. It can also be used to prevent and treat ulcers in patients taking medicines called non-steroidal anti-inflammatory drugs (NSAIDs). This medicine may be used for other purposes; ask your health care provider or pharmacist if you have questions. COMMON BRAND NAME(S): Heartburn Relief, Prevacid What should I tell my health care provider before I take this medicine? They need to know if you have any of these conditions:  liver disease  low levels of magnesium in the blood  lupus  an unusual or allergic reaction to lansoprazole, other medicines, foods, dyes, or preservatives  pregnant or trying to get pregnant  breast-feeding How should I use this medicine? Take this medicine by mouth. Swallow the capsules whole with a drink of water. Follow the directions on the prescription label. Do not crush or chew. This medicine works best if taken on an empty stomach 30 to 60 minutes before food. Take your medicine at regular intervals. Do not take more often than directed. If  you have difficulty swallowing the capsules, you may open the capsule and sprinkle the contents on a tablespoon of any of the following foods: applesauce, Ensure brand pudding, cottage cheese, yogurt, or strained pears. Do not crush the contents of the capsule into the food. Swallow the dose immediately after  preparing it. Do not chew. Follow with a drink of water. Talk to your pediatrician regarding the use of this medicine in children. Special care may be needed. Overdosage: If you think you have taken too much of this medicine contact a poison control center or emergency room at once. NOTE: This medicine is only for you. Do not share this medicine with others. What if I miss a dose? If you miss a dose, take it as soon as you can. If it is almost time for your next dose, take only that dose. Do not take double or extra doses. What may interact with this medicine? Do not take this medicine with any of the following medications:  atazanavir  nelfinavir This medicine may also interact with the following medications:  ampicillin  delavirdine  digoxin  diuretics  iron salts  itraconazole, ketoconazole, voriconazole, or other prescription medicines for fungus or yeast infections  sucralfate  theophylline  warfarin This list may not describe all possible interactions. Give your health care provider a list of all the medicines, herbs, non-prescription drugs, or dietary supplements you use. Also tell them if you smoke, drink alcohol, or use illegal drugs. Some items may interact with your medicine. What should I watch for while using this medicine? It can take several days before your stomach pain gets better. Check with your doctor or health care professional if your condition does not start to get better, or if it gets worse. Do not treat diarrhea with over the counter products. Contact your doctor if you have diarrhea that lasts more than 2 days or if it is severe and watery. You may need blood work done while you are taking this medicine. This medicine may cause a decrease in vitamin B12. You should make sure that you get enough vitamin B12 while you are taking this medicine. Discuss the foods you eat and the vitamins you take with your health care professional. What side effects may I  notice from receiving this medicine? Side effects that you should report to your doctor or health care professional as soon as possible:   allergic reactions like skin rash, itching or hives, swelling of the face, lips, or tongue   bone, muscle or joint pain   breathing problems   chest pain or chest tightness   dark yellow or brown urine   diarrhea   dizziness   fast, irregular heartbeat   feeling faint or lightheaded   fever or sore throat   muscle spasm   palpitations   rash on cheeks or arms that gets worse in the sun   redness, blistering, peeling or loosening of the skin, including inside the mouth   seizures  stomach polyps   tremors   unusual bleeding or bruising   unusually weak or tired   yellowing of the eyes or skin Side effects that usually do not require medical attention (report to your doctor or health care professional if they continue or are bothersome):   constipation   dry mouth   headache   loose stools   nausea This list may not describe all possible side effects. Call your doctor for medical advice about side effects. You may report side  effects to FDA at 1-800-FDA-1088. Where should I keep my medicine? Keep out of the reach of children. Store at room temperature between 15 and 30 degrees C (59 and 86 degrees F). Protect from moisture. Throw away any unused medicine after the expiration date. NOTE: This sheet is a summary. It may not cover all possible information. If you have questions about this medicine, talk to your doctor, pharmacist, or health care provider.  2020 Elsevier/Gold Standard (2016-12-09 15:02:37)     Monitored Anesthesia Care, Care After These instructions provide you with information about caring for yourself after your procedure. Your health care provider may also give you more specific instructions. Your treatment has been planned according to current medical practices, but problems  sometimes occur. Call your health care provider if you have any problems or questions after your procedure. What can I expect after the procedure? After your procedure, you may:  Feel sleepy for several hours.  Feel clumsy and have poor balance for several hours.  Feel forgetful about what happened after the procedure.  Have poor judgment for several hours.  Feel nauseous or vomit.  Have a sore throat if you had a breathing tube during the procedure. Follow these instructions at home: For at least 24 hours after the procedure:      Have a responsible adult stay with you. It is important to have someone help care for you until you are awake and alert.  Rest as needed.  Do not: ? Participate in activities in which you could fall or become injured. ? Drive. ? Use heavy machinery. ? Drink alcohol. ? Take sleeping pills or medicines that cause drowsiness. ? Make important decisions or sign legal documents. ? Take care of children on your own. Eating and drinking  Follow the diet that is recommended by your health care provider.  If you vomit, drink water, juice, or soup when you can drink without vomiting.  Make sure you have little or no nausea before eating solid foods. General instructions  Take over-the-counter and prescription medicines only as told by your health care provider.  If you have sleep apnea, surgery and certain medicines can increase your risk for breathing problems. Follow instructions from your health care provider about wearing your sleep device: ? Anytime you are sleeping, including during daytime naps. ? While taking prescription pain medicines, sleeping medicines, or medicines that make you drowsy.  If you smoke, do not smoke without supervision.  Keep all follow-up visits as told by your health care provider. This is important. Contact a health care provider if:  You keep feeling nauseous or you keep vomiting.  You feel light-headed.  You  develop a rash.  You have a fever. Get help right away if:  You have trouble breathing. Summary  For several hours after your procedure, you may feel sleepy and have poor judgment.  Have a responsible adult stay with you for at least 24 hours or until you are awake and alert. This information is not intended to replace advice given to you by your health care provider. Make sure you discuss any questions you have with your health care provider. Document Revised: 07/21/2017 Document Reviewed: 08/13/2015 Elsevier Patient Education  [place].

## 2020-04-20 ENCOUNTER — Other Ambulatory Visit: Payer: Self-pay

## 2020-04-20 LAB — SURGICAL PATHOLOGY

## 2020-04-26 ENCOUNTER — Encounter (HOSPITAL_COMMUNITY): Payer: Self-pay | Admitting: Internal Medicine

## 2020-05-02 ENCOUNTER — Other Ambulatory Visit (INDEPENDENT_AMBULATORY_CARE_PROVIDER_SITE_OTHER): Payer: Self-pay | Admitting: Internal Medicine

## 2020-05-04 ENCOUNTER — Telehealth (INDEPENDENT_AMBULATORY_CARE_PROVIDER_SITE_OTHER): Payer: Self-pay | Admitting: *Deleted

## 2020-05-04 NOTE — Telephone Encounter (Signed)
Patient called the office on 05/02/2020. She reported that the only thing that she had changed was she ws taking the Prevacid 30 mg. Since this time she has noted incredible joint pain,muscle weakness.  This was reviewed with Dr.Rehman she said the patient could stop this. She may try the Pepcid 40 mg - take 1 by mouth at bedtime.  Patient was made aware and she states that she had started feeling better. She would like to try the Prevacid a little longer. She will get the Pepcid OTC and take this if needed. If she tries this and it works better she will call us to call in a prescription for the 40 mg. We will need to verify the pharmacy to send it to if she calls back for it.

## 2020-05-09 ENCOUNTER — Other Ambulatory Visit: Payer: Self-pay | Admitting: Cardiology

## 2020-05-19 ENCOUNTER — Other Ambulatory Visit (INDEPENDENT_AMBULATORY_CARE_PROVIDER_SITE_OTHER): Payer: Self-pay | Admitting: *Deleted

## 2020-05-19 DIAGNOSIS — R1319 Other dysphagia: Secondary | ICD-10-CM

## 2020-05-19 DIAGNOSIS — K219 Gastro-esophageal reflux disease without esophagitis: Secondary | ICD-10-CM

## 2020-05-19 MED ORDER — LANSOPRAZOLE 15 MG PO CPDR: 15.0000 mg | DELAYED_RELEASE_CAPSULE | Freq: Two times a day (BID) | ORAL | 3 refills | Status: DC

## 2020-05-25 DIAGNOSIS — K090 Developmental odontogenic cysts: Secondary | ICD-10-CM | POA: Diagnosis not present

## 2020-06-02 ENCOUNTER — Other Ambulatory Visit (INDEPENDENT_AMBULATORY_CARE_PROVIDER_SITE_OTHER): Payer: Self-pay

## 2020-06-02 DIAGNOSIS — R1319 Other dysphagia: Secondary | ICD-10-CM

## 2020-06-02 DIAGNOSIS — K219 Gastro-esophageal reflux disease without esophagitis: Secondary | ICD-10-CM

## 2020-06-02 MED ORDER — LANSOPRAZOLE 15 MG PO CPDR: 15.0000 mg | DELAYED_RELEASE_CAPSULE | Freq: Two times a day (BID) | ORAL | 3 refills | Status: DC

## 2020-06-26 DIAGNOSIS — H6123 Impacted cerumen, bilateral: Secondary | ICD-10-CM | POA: Diagnosis not present

## 2020-06-26 DIAGNOSIS — R1312 Dysphagia, oropharyngeal phase: Secondary | ICD-10-CM | POA: Diagnosis not present

## 2020-06-26 DIAGNOSIS — R070 Pain in throat: Secondary | ICD-10-CM | POA: Diagnosis not present

## 2020-06-28 ENCOUNTER — Other Ambulatory Visit (INDEPENDENT_AMBULATORY_CARE_PROVIDER_SITE_OTHER): Payer: Self-pay | Admitting: *Deleted

## 2020-06-28 DIAGNOSIS — R1319 Other dysphagia: Secondary | ICD-10-CM

## 2020-06-28 DIAGNOSIS — K219 Gastro-esophageal reflux disease without esophagitis: Secondary | ICD-10-CM

## 2020-06-28 MED ORDER — LANSOPRAZOLE 15 MG PO CPDR: 15.0000 mg | DELAYED_RELEASE_CAPSULE | Freq: Two times a day (BID) | ORAL | 3 refills | Status: DC

## 2020-06-28 NOTE — Telephone Encounter (Signed)
Patient called the office and states that she needs to have her proscription for Lansoprazole sent to [facility]. This has been done.  It was noted that this had on 05/27/20 been sent to [facility]. Patient was called and made aware.

## 2020-07-13 ENCOUNTER — Other Ambulatory Visit (HOSPITAL_COMMUNITY): Payer: Self-pay | Admitting: Internal Medicine

## 2020-07-13 DIAGNOSIS — J9611 Chronic respiratory failure with hypoxia: Secondary | ICD-10-CM

## 2020-07-13 DIAGNOSIS — I471 Supraventricular tachycardia: Secondary | ICD-10-CM | POA: Diagnosis not present

## 2020-07-13 DIAGNOSIS — R0789 Other chest pain: Secondary | ICD-10-CM | POA: Diagnosis not present

## 2020-07-25 ENCOUNTER — Other Ambulatory Visit: Payer: Self-pay

## 2020-07-25 ENCOUNTER — Ambulatory Visit (HOSPITAL_COMMUNITY)
Admission: RE | Admit: 2020-07-25 | Discharge: 2020-07-25 | Disposition: A | Discharge status: Home / Self Care | Payer: Medicare Other | Source: Ambulatory Visit | Attending: Internal Medicine | Admitting: Internal Medicine

## 2020-07-25 DIAGNOSIS — J9611 Chronic respiratory failure with hypoxia: Secondary | ICD-10-CM | POA: Insufficient documentation

## 2020-07-25 DIAGNOSIS — J9690 Respiratory failure, unspecified, unspecified whether with hypoxia or hypercapnia: Secondary | ICD-10-CM | POA: Diagnosis not present

## 2020-07-25 DIAGNOSIS — R0902 Hypoxemia: Secondary | ICD-10-CM | POA: Diagnosis not present

## 2020-08-08 DIAGNOSIS — D485 Neoplasm of uncertain behavior of skin: Secondary | ICD-10-CM | POA: Diagnosis not present

## 2020-08-08 DIAGNOSIS — D225 Melanocytic nevi of trunk: Secondary | ICD-10-CM | POA: Diagnosis not present

## 2020-08-08 DIAGNOSIS — B350 Tinea barbae and tinea capitis: Secondary | ICD-10-CM | POA: Diagnosis not present

## 2020-08-08 DIAGNOSIS — L905 Scar conditions and fibrosis of skin: Secondary | ICD-10-CM | POA: Diagnosis not present

## 2020-08-08 DIAGNOSIS — Z1283 Encounter for screening for malignant neoplasm of skin: Secondary | ICD-10-CM | POA: Diagnosis not present

## 2020-08-08 DIAGNOSIS — B078 Other viral warts: Secondary | ICD-10-CM | POA: Diagnosis not present

## 2020-08-29 DIAGNOSIS — L988 Other specified disorders of the skin and subcutaneous tissue: Secondary | ICD-10-CM | POA: Diagnosis not present

## 2020-08-29 DIAGNOSIS — D485 Neoplasm of uncertain behavior of skin: Secondary | ICD-10-CM | POA: Diagnosis not present

## 2020-09-19 ENCOUNTER — Other Ambulatory Visit: Payer: Self-pay | Admitting: Internal Medicine

## 2020-09-25 ENCOUNTER — Other Ambulatory Visit: Payer: Self-pay | Admitting: Cardiology

## 2020-09-26 DIAGNOSIS — B350 Tinea barbae and tinea capitis: Secondary | ICD-10-CM | POA: Diagnosis not present

## 2020-09-26 DIAGNOSIS — L928 Other granulomatous disorders of the skin and subcutaneous tissue: Secondary | ICD-10-CM | POA: Diagnosis not present

## 2020-10-12 NOTE — Progress Notes (Signed)
Cardiology Office Note  Date: 10/13/2020   ID: [name], DOB 1955/06/08, MRN [id number]  PCP:  Carylon Perches, MD  Cardiologist:  Nona Dell, MD Electrophysiologist:  None   Chief Complaint  Patient presents with   Cardiac follow-up    History of Present Illness: [name] is a 65 y.o. female last seen in November 2021.  She is here for a routine visit.  In general she feels like her shortness of breath has been worse in the last few months despite similar oxygen supplementation and medical therapy.  She has a pending visit to see [name] for follow-up.  She reports palpitations as before, heart rate is in the 80s today on Cardizem CD 240 mg daily, she has not had to use any short acting Cardizem.  Her last echocardiogram was in 2017 as noted below, we discussed obtaining a follow-up study to ensure stable cardiac structure and function.  Past Medical History:  Diagnosis Date   Allergy    Anxiety    Arthritis    COPD (chronic obstructive pulmonary disease) (HCC)    Home oxygen at [facility]   Depression    Diverticulosis    History of cardiac catheterization    No significant CAD 2005   Hyperlipidemia    Hypoglycemia    IBS (irritable bowel syndrome)    Neuromuscular disorder (HCC)    Osteoporosis    Pneumonia 2015   Shingles 2012   Sleep apnea    SVT (supraventricular tachycardia) (HCC)     Past Surgical History:  Procedure Laterality Date   BACK SURGERY     BREAST SURGERY     CARDIAC CATHETERIZATION  2004   COLONOSCOPY  03/22/2011   Procedure: COLONOSCOPY;  Surgeon: Malissa Hippo, MD;  [facility];  Service: Endoscopy;  Laterality: N/A;   COLONOSCOPY WITH PROPOFOL N/A 05/16/2017   Procedure: COLONOSCOPY WITH PROPOFOL;  Surgeon: Malissa Hippo, MD;  [facility];  Service: Endoscopy;  Laterality: N/A;   ESOPHAGEAL DILATION N/A 04/19/2020   Procedure: ESOPHAGEAL DILATION;  Surgeon: Malissa Hippo, MD;  [facility];  Service: Endoscopy;  Laterality: N/A;   ESOPHAGOGASTRODUODENOSCOPY (EGD) WITH PROPOFOL N/A 04/19/2020   Procedure: ESOPHAGOGASTRODUODENOSCOPY (EGD) WITH PROPOFOL;  Surgeon: Malissa Hippo, MD;  [facility];  Service: Endoscopy;  Laterality: N/A;  9:15   FLEXIBLE SIGMOIDOSCOPY N/A 09/26/2017   Procedure: FLEXIBLE SIGMOIDOSCOPY with Propofol ;  Surgeon: Malissa Hippo, MD;  [facility];  Service: Endoscopy;  Laterality: N/A;  9:30   POLYPECTOMY  05/16/2017   Procedure: POLYPECTOMY;  Surgeon: Malissa Hippo, MD;  [facility];  Service: Endoscopy;;   SPINAL FUSION      Current Outpatient Medications  Medication Sig Dispense Refill   acyclovir (ZOVIRAX) 800 MG tablet Take 800 mg by mouth 5 (five) times daily as needed (for break outs).      ALPRAZolam (XANAX) 0.25 MG tablet Take 0.25 mg by mouth 3 (three) times daily as needed for anxiety.      aspirin 81 MG tablet Take 1 tablet (81 mg total) by mouth daily. 100 tablet 3   atorvastatin (LIPITOR) 40 MG tablet TAKE 1 TABLET DAILY 90 tablet 3   carboxymethylcellulose 1 % ophthalmic solution Place 2 drops into both eyes 3 (three) times daily as needed (for dry eyes).     cetirizine (ZYRTEC) 10 MG tablet Take 10 mg by mouth daily.  clonazePAM (KLONOPIN) 0.5 MG tablet Take 1 tablet (0.5 mg total) by mouth at bedtime. (Patient taking differently: Take 0.5 mg by mouth at bedtime as needed for anxiety.) 30 tablet 5   dicyclomine (BENTYL) 10 MG capsule Take 1 capsule (10 mg total) by mouth 3 (three) times daily as needed for spasms. 270 capsule 3   diltiazem (CARDIZEM CD) 240 MG 24 hr capsule TAKE 1 CAPSULE DAILY 90 capsule 3   diltiazem (CARDIZEM) 30 MG tablet Take 1-2 tablets every 6 hours as needed for persistent tachycardai 90 tablet 3   escitalopram (LEXAPRO) 20 MG tablet Take 20 mg by mouth daily.     fluticasone (FLONASE) 50 MCG/ACT nasal spray Place 1 spray into both nostrils daily.     lansoprazole  (PREVACID) 15 MG capsule Take 1 capsule (15 mg total) by mouth 2 (two) times daily before a meal. 180 capsule 3   methocarbamol (ROBAXIN) 500 MG tablet Take 1 tablet (500 mg total) by mouth 2 (two) times daily. (Patient taking differently: Take 500 mg by mouth 2 (two) times daily as needed for muscle spasms.) 20 tablet 0   montelukast (SINGULAIR) 10 MG tablet Take 1 tablet (10 mg total) by mouth at bedtime. 30 tablet 1   ondansetron (ZOFRAN) 4 MG tablet Take 1 tablet (4 mg total) every 8 (eight) hours as needed by mouth for nausea or vomiting. 30 tablet 0   oxybutynin (DITROPAN) 5 MG tablet Take 5 mg by mouth daily.      OXYGEN Inhale 4 L into the lungs continuous.     roflumilast (DALIRESP) 500 MCG TABS tablet Take 500 mcg by mouth daily.     sodium chloride (OCEAN) 0.65 % SOLN nasal spray Place 2-3 sprays into both nostrils as needed for congestion.     traMADol (ULTRAM) 50 MG tablet Take 50 mg by mouth every 6 (six) hours as needed for moderate pain.      traZODone (DESYREL) 50 MG tablet Take 50 mg by mouth at bedtime as needed for sleep.     TRELEGY ELLIPTA 100-62.5-25 MCG/INH AEPB USE 1 INHALATION ORALLY    DAILY 180 each 0   No current facility-administered medications for this visit.   Allergies:  Iohexol, Neosporin [neomycin-bacitracin zn-polymyx], Adhesive [tape], Chantix [varenicline], Ketek [telithromycin], and Penicillins   ROS: No syncope.  Physical Exam: VS:  BP (!) 130/58   Pulse 86   Ht 5\' 1"  (1.549 m)   Wt 132 lb (59.9 kg)   SpO2 99% Comment: On 4 L [facility]  BMI 24.94 kg/m , BMI Body mass index is 24.94 kg/m.  Wt Readings from Last 3 Encounters:  10/13/20 132 lb (59.9 kg)  04/17/20 130 lb (59 kg)  03/29/20 130 lb (59 kg)    General: Patient appears comfortable at rest.  Wearing oxygen via nasal cannula. HEENT: Conjunctiva and lids normal, wearing a mask. Neck: Supple, no elevated JVP or carotid bruits, no thyromegaly. Lungs: Diminished breath sounds  throughout. Cardiac: Distant heart sounds, regular rate and rhythm, no S3 or significant systolic murmur, no pericardial rub. Extremities: No pitting edema.  ECG:  An ECG dated 03/29/2020 was personally reviewed today and demonstrated:  Sinus tachycardia with nonspecific ST changes.  Recent Labwork: 11/01/2019: ALT 14; AST 14; BUN 9; Creatinine, Ser 0.68; Hemoglobin 12.3; Platelets 354; Potassium 3.8; Sodium 139   Other Studies Reviewed Today:  Echocardiogram 06/26/2015: Study Conclusions   - Left ventricle: The cavity size was normal. Wall thickness was   normal.  Systolic function was normal. The estimated ejection   fraction was in the range of 60% to 65%. Wall motion was normal;   there were no regional wall motion abnormalities. Doppler   parameters are consistent with abnormal left ventricular   relaxation (grade 1 diastolic dysfunction). - Left atrium: The atrium was mildly dilated.  Assessment and Plan:  1.  PSVT.  Plan to continue Cardizem CD 240 mg daily with short acting Cardizem available for breakthrough episodes.  2.  Severe oxygen dependent COPD.  Follow-up planned with [name].  We will also obtain an echocardiogram to ensure stable cardiac structure and function in light of worsening shortness of breath over the last few months.  Medication Adjustments/Labs and Tests Ordered: Current medicines are reviewed at length with the patient today.  Concerns regarding medicines are outlined above.   Tests Ordered: Orders Placed This Encounter  Procedures   ECHOCARDIOGRAM COMPLETE     Medication Changes: No orders of the defined types were placed in this encounter.   Disposition:  Follow up  6 months.  Signed, Jonelle Sidle, MD, [facility] 10/13/2020 2:10 PM    [facility] at [facility] [address] [place], [place] [address] Phone: [phone]; Fax: [phone]

## 2020-10-13 ENCOUNTER — Encounter: Payer: Self-pay | Admitting: Cardiology

## 2020-10-13 ENCOUNTER — Ambulatory Visit (INDEPENDENT_AMBULATORY_CARE_PROVIDER_SITE_OTHER): Payer: Medicare Other | Admitting: Cardiology

## 2020-10-13 ENCOUNTER — Other Ambulatory Visit: Payer: Self-pay

## 2020-10-13 VITALS — BP 130/58 | HR 86 | Ht 61.0 in | Wt 132.0 lb

## 2020-10-13 DIAGNOSIS — I471 Supraventricular tachycardia: Secondary | ICD-10-CM

## 2020-10-13 DIAGNOSIS — J449 Chronic obstructive pulmonary disease, unspecified: Secondary | ICD-10-CM | POA: Diagnosis not present

## 2020-10-13 DIAGNOSIS — R0602 Shortness of breath: Secondary | ICD-10-CM

## 2020-10-13 NOTE — Patient Instructions (Signed)
Medication Instructions:  Your physician recommends that you continue on your current medications as directed. Please refer to the Current Medication list given to you today.  *If you need a refill on your cardiac medications before your next appointment, please call your pharmacy*   Lab Work: None today If you have labs (blood work) drawn today and your tests are completely normal, you will receive your results only by: . MyChart Message (if you have MyChart) OR . A paper copy in the mail If you have any lab test that is abnormal or we need to change your treatment, we will call you to review the results.   Testing/Procedures: Your physician has requested that you have an echocardiogram. Echocardiography is a painless test that uses sound waves to create images of your heart. It provides your doctor with information about the size and shape of your heart and how well your heart's chambers and valves are working. This procedure takes approximately one hour. There are no restrictions for this procedure.    Follow-Up: At [facility], you and your health needs are our priority.  As part of our continuing mission to provide you with exceptional heart care, we have created designated Provider Care Teams.  These Care Teams include your primary Cardiologist (physician) and Advanced Practice Providers (APPs -  Physician Assistants and Nurse Practitioners) who all work together to provide you with the care you need, when you need it.  We recommend signing up for the patient portal called "MyChart".  Sign up information is provided on this After Visit Summary.  MyChart is used to connect with patients for Virtual Visits (Telemedicine).  Patients are able to view lab/test results, encounter notes, upcoming appointments, etc.  Non-urgent messages can be sent to your provider as well.   To learn more about what you can do with MyChart, go to [email or web address].    Your next appointment:   6  month(s)  The format for your next appointment:   In Person  Provider:   Samuel McDowell, MD   Other Instructions None   

## 2020-10-19 DIAGNOSIS — F419 Anxiety disorder, unspecified: Secondary | ICD-10-CM | POA: Diagnosis not present

## 2020-10-19 DIAGNOSIS — R7303 Prediabetes: Secondary | ICD-10-CM | POA: Diagnosis not present

## 2020-10-19 DIAGNOSIS — J9611 Chronic respiratory failure with hypoxia: Secondary | ICD-10-CM | POA: Diagnosis not present

## 2020-10-19 DIAGNOSIS — Z79899 Other long term (current) drug therapy: Secondary | ICD-10-CM | POA: Diagnosis not present

## 2020-10-23 DIAGNOSIS — R7309 Other abnormal glucose: Secondary | ICD-10-CM | POA: Diagnosis not present

## 2020-10-23 DIAGNOSIS — J9611 Chronic respiratory failure with hypoxia: Secondary | ICD-10-CM | POA: Diagnosis not present

## 2020-10-23 DIAGNOSIS — E785 Hyperlipidemia, unspecified: Secondary | ICD-10-CM | POA: Diagnosis not present

## 2020-10-31 ENCOUNTER — Other Ambulatory Visit: Payer: Self-pay

## 2020-10-31 ENCOUNTER — Ambulatory Visit (HOSPITAL_COMMUNITY)
Admission: RE | Admit: 2020-10-31 | Discharge: 2020-10-31 | Disposition: A | Discharge status: Home / Self Care | Payer: Medicare Other | Source: Ambulatory Visit | Attending: Cardiology | Admitting: Cardiology

## 2020-10-31 DIAGNOSIS — R0602 Shortness of breath: Secondary | ICD-10-CM

## 2020-10-31 LAB — ECHOCARDIOGRAM COMPLETE
AR max vel: 1.77 cm2
AV Area VTI: 2.06 cm2
AV Area mean vel: 1.67 cm2
AV Mean grad: 4.2 mmHg
AV Peak grad: 8.2 mmHg
Ao pk vel: 1.43 m/s
Area-P 1/2: 2.83 cm2
S' Lateral: 2.95 cm

## 2020-10-31 NOTE — Progress Notes (Signed)
*  PRELIMINARY RESULTS* Echocardiogram 2D Echocardiogram has been performed.  [name] 10/31/2020, 11:29 AM

## 2020-11-01 ENCOUNTER — Telehealth: Payer: Self-pay

## 2020-11-01 NOTE — Telephone Encounter (Signed)
-----   Message from Satira Sark, MD sent at 10/31/2020  6:33 PM EDT ----- Results reviewed.  Cardiac function remains normal with LVEF 55 to 60% and only mild diastolic dysfunction.  Also normal RV contraction.  Continue with present cardiac follow-up plan.

## 2020-11-01 NOTE — Telephone Encounter (Signed)
Patient notified and voiced understanding. Pt had no questions or concerns at this time.

## 2020-12-02 ENCOUNTER — Other Ambulatory Visit: Payer: Self-pay | Admitting: Pulmonary Disease

## 2020-12-12 ENCOUNTER — Telehealth: Payer: Self-pay | Admitting: Pulmonary Disease

## 2020-12-12 MED ORDER — TRELEGY ELLIPTA 100-62.5-25 MCG/INH IN AEPB: INHALATION_SPRAY | RESPIRATORY_TRACT | 3 refills | Status: DC

## 2020-12-12 NOTE — Telephone Encounter (Signed)
90 day rx for trelegy was sent to preferred pharm  Left detailed msg for the pt letting her know

## 2021-01-18 DIAGNOSIS — E785 Hyperlipidemia, unspecified: Secondary | ICD-10-CM | POA: Diagnosis not present

## 2021-01-18 DIAGNOSIS — Z79899 Other long term (current) drug therapy: Secondary | ICD-10-CM | POA: Diagnosis not present

## 2021-01-18 DIAGNOSIS — J9611 Chronic respiratory failure with hypoxia: Secondary | ICD-10-CM | POA: Diagnosis not present

## 2021-01-18 DIAGNOSIS — R7303 Prediabetes: Secondary | ICD-10-CM | POA: Diagnosis not present

## 2021-01-24 ENCOUNTER — Telehealth: Payer: Self-pay | Admitting: Pulmonary Disease

## 2021-01-24 NOTE — Telephone Encounter (Signed)
I have sent the last OV note to [facility]

## 2021-01-25 ENCOUNTER — Other Ambulatory Visit (HOSPITAL_COMMUNITY): Payer: Self-pay | Admitting: Internal Medicine

## 2021-01-25 DIAGNOSIS — M545 Low back pain, unspecified: Secondary | ICD-10-CM | POA: Diagnosis not present

## 2021-01-25 DIAGNOSIS — Z23 Encounter for immunization: Secondary | ICD-10-CM | POA: Diagnosis not present

## 2021-01-25 DIAGNOSIS — R7309 Other abnormal glucose: Secondary | ICD-10-CM | POA: Diagnosis not present

## 2021-01-25 DIAGNOSIS — J441 Chronic obstructive pulmonary disease with (acute) exacerbation: Secondary | ICD-10-CM | POA: Diagnosis not present

## 2021-02-06 ENCOUNTER — Encounter: Payer: Self-pay | Admitting: Pulmonary Disease

## 2021-02-06 ENCOUNTER — Other Ambulatory Visit: Payer: Self-pay

## 2021-02-06 ENCOUNTER — Ambulatory Visit (INDEPENDENT_AMBULATORY_CARE_PROVIDER_SITE_OTHER): Payer: Medicare Other | Admitting: Pulmonary Disease

## 2021-02-06 ENCOUNTER — Telehealth: Payer: Self-pay | Admitting: Pulmonary Disease

## 2021-02-06 ENCOUNTER — Ambulatory Visit (HOSPITAL_COMMUNITY)
Admission: RE | Admit: 2021-02-06 | Discharge: 2021-02-06 | Disposition: A | Discharge status: Home / Self Care | Payer: Medicare Other | Source: Ambulatory Visit | Attending: Internal Medicine | Admitting: Internal Medicine

## 2021-02-06 VITALS — BP 132/70 | HR 98 | Temp 98.2°F | Ht 66.0 in | Wt 137.0 lb

## 2021-02-06 DIAGNOSIS — J9611 Chronic respiratory failure with hypoxia: Secondary | ICD-10-CM | POA: Diagnosis not present

## 2021-02-06 DIAGNOSIS — J432 Centrilobular emphysema: Secondary | ICD-10-CM | POA: Diagnosis not present

## 2021-02-06 DIAGNOSIS — J9612 Chronic respiratory failure with hypercapnia: Secondary | ICD-10-CM | POA: Diagnosis not present

## 2021-02-06 DIAGNOSIS — M545 Low back pain, unspecified: Secondary | ICD-10-CM | POA: Diagnosis not present

## 2021-02-06 DIAGNOSIS — G4733 Obstructive sleep apnea (adult) (pediatric): Secondary | ICD-10-CM

## 2021-02-06 MED ORDER — AZELASTINE HCL 0.15 % NA SOLN: 1.0000 | Freq: Two times a day (BID) | NASAL | Status: DC

## 2021-02-06 NOTE — Progress Notes (Signed)
[facility]  Chief Complaint  Patient presents with   Follow-up    Breathing has diminished since last OV. Persistent dry cough.  4LO2 all of the time including while sleeping. Sleeps with bipap as well.     Constitutional:  BP 132/70 (BP Location: Left Arm, Patient Position: Sitting)   Pulse 98   Temp 98.2 F (36.8 C) (Temporal)   Ht 5\' 6"  (1.676 m)   Wt 137 lb 0.6 oz (62.2 kg)   SpO2 100% Comment: 4L  BMI 22.12 kg/m   Past Medical History:  Anxiety, OA, Depression, Diverticulosis, HLD, IBS, Osteoporosis, PNA, Shingles, SVT  Past Surgical History:  Her  has a past surgical history that includes Spinal fusion; Back surgery; Colonoscopy (03/22/2011); Colonoscopy with propofol (N/A, 05/16/2017); polypectomy (05/16/2017); Cardiac catheterization (2004); Flexible sigmoidoscopy (N/A, 09/26/2017); Breast surgery; Esophagogastroduodenoscopy (egd) with propofol (N/A, 04/19/2020); and Esophageal dilation (N/A, 04/19/2020).  Brief Summary:  [name] is a 65 y.o. female former smoker with COPD from emphysema and chronic respiratory failure with hypoxia.      Subjective:   She is here with her aide.  She is still deciding about colon surgery.  She has been getting more short of breath.  Has some cough, but not bringing up sputum.  Uses 4 to 7 liters oxygen.  Not having fever, chest pain, hemoptysis, or leg swelling.  Hasn't needed to use klonopin as much at night.  She can tell when she is going to have an issue with RBD.  She has been getting nasal congestion and croupy cough.  Physical Exam:   Appearance - well kempt, wearing oxygen  ENMT - no sinus tenderness, no oral exudate, no LAN, Mallampati 2 airway, no stridor  Respiratory - equal breath sounds bilaterally, no wheezing or rales  CV - s1s2 regular rate and rhythm, no murmurs  Ext - no clubbing, no edema  Skin - no rashes  Psych - normal mood and affect     Pulmonary testing:  Spirometry 12/31/11 >> FEV1 0.4 (18%), FEV1% 42  Chest Imaging:  CT chest 10/16/19 >> mod/severe centrilobular emphysema, granuloma RUL  Sleep Tests:  Bipap titration study 05/16/14 >> Bipap 15/10 cm H2O with 3 liters O2  Cardiac Tests:  Echo 10/31/20 >> EF 55 to 60%, grade 1 DD  Social History:  She  reports that she quit smoking about 5 years ago. Her smoking use included cigarettes. She has a 40.00 pack-year smoking history. She has never used smokeless tobacco. She reports current alcohol use. She reports that she does not use drugs.  Family History:  Her family history includes Asthma in her mother; Diabetes in her mother; Heart attack in her mother; Heart failure in her mother.     Assessment/Plan:   Severe COPD with emphysema. - discussed different roles for her medications - continue trelegy and daliresp - prn albuterol  Obstructive sleep apnea. - she reports compliance with Bipap and benefit from therapy - uses [place] as DME for Bipap - will get copy of her Bipap download and call her with results  REM Sleep Behavior disorder. - clonazepam 0.5 mg qhs prn  Chronic respiratory failure with hypoxia. - she uses 4 to 7 liters 24/7 - advised her to increase her O2 prior to doing exercise - uses [facility] as DME for oxygen set up, but is trying to switch to a different DME  Allergic rhinitis. - will have her try [place] and nasacort on  regular basis - continue zyrtec, singulair - add astepro  Chronic sigmoid diverticulitis. - followed by Dr. Laural Golden with GI - discussed that she would be at high risk for perioperative respiratory complications if she needed to have surgery, and at that point it would be risk/benefit analysis and what her comfort level is with risk about proceeding with surgery  Time Spent Involved in Patient Care on Day of Examination:  33 minutes  Follow up:   Patient Instructions  Will call with results of Bipap  report  Can try astepro to help with nasal congestion and cough  Follow up in 4 months  Medication List:   Allergies as of 02/06/2021       Reactions   Iohexol Anaphylaxis, Other (See Comments)    Desc: IV DYE  ANAPHYLATIC SHOCK X2 ONCE AFTER PREMEDS   Neosporin [neomycin-bacitracin Zn-polymyx] Itching   Adhesive [tape] Itching, Other (See Comments)   Break out   Chantix [varenicline] Other (See Comments)   PALPITATIONS   Ketek [telithromycin] Other (See Comments)   Loopy, eyes went different directions   Penicillins Other (See Comments)   Unknown Has patient had a PCN reaction causing immediate rash, facial/tongue/throat swelling, SOB or lightheadedness with hypotension: Unknown Has patient had a PCN reaction causing severe rash involving mucus membranes or skin necrosis: Unknown Has patient had a PCN reaction that required hospitalization: Unknown Has patient had a PCN reaction occurring within the last 10 years: No If all of the above answers are "NO", then may proceed with Cephalosporin use.        Medication List        Accurate as of February 06, 2021  3:29 PM. If you have any questions, ask your nurse or doctor.          acyclovir 800 MG tablet Commonly known as: ZOVIRAX Take 800 mg by mouth 5 (five) times daily as needed (for break outs).   ALPRAZolam 0.25 MG tablet Commonly known as: XANAX Take 0.25 mg by mouth 3 (three) times daily as needed for anxiety.   aspirin 81 MG tablet Take 1 tablet (81 mg total) by mouth daily.   atorvastatin 40 MG tablet Commonly known as: LIPITOR TAKE 1 TABLET DAILY   Azelastine HCl 0.15 % Soln Commonly known as: Astepro Place 1 spray into the nose in the morning and at bedtime. Started by: Chesley Mires, MD   carboxymethylcellulose 1 % ophthalmic solution Place 2 drops into both eyes 3 (three) times daily as needed (for dry eyes).   cetirizine 10 MG tablet Commonly known as: ZYRTEC Take 10 mg by mouth daily.    clonazePAM 0.5 MG tablet Commonly known as: KLONOPIN Take 1 tablet (0.5 mg total) by mouth at bedtime. What changed:  when to take this reasons to take this   dicyclomine 10 MG capsule Commonly known as: Bentyl Take 1 capsule (10 mg total) by mouth 3 (three) times daily as needed for spasms.   diltiazem 240 MG 24 hr capsule Commonly known as: CARDIZEM CD TAKE 1 CAPSULE DAILY   diltiazem 30 MG tablet Commonly known as: Cardizem Take 1-2 tablets every 6 hours as needed for persistent tachycardai   escitalopram 20 MG tablet Commonly known as: LEXAPRO Take 20 mg by mouth daily.   fluticasone 50 MCG/ACT nasal spray Commonly known as: FLONASE Place 1 spray into both nostrils daily.   lansoprazole 15 MG capsule Commonly known as: Prevacid Take 1 capsule (15 mg total) by mouth 2 (two) times daily  before a meal.   methocarbamol 500 MG tablet Commonly known as: ROBAXIN Take 1 tablet (500 mg total) by mouth 2 (two) times daily. What changed:  when to take this reasons to take this   montelukast 10 MG tablet Commonly known as: SINGULAIR Take 1 tablet (10 mg total) by mouth at bedtime.   ondansetron 4 MG tablet Commonly known as: ZOFRAN Take 1 tablet (4 mg total) every 8 (eight) hours as needed by mouth for nausea or vomiting.   oxybutynin 5 MG tablet Commonly known as: DITROPAN Take 5 mg by mouth daily.   OXYGEN Inhale 4 L into the lungs continuous.   roflumilast 500 MCG Tabs tablet Commonly known as: DALIRESP Take 500 mcg by mouth daily.   sodium chloride 0.65 % Soln nasal spray Commonly known as: OCEAN Place 2-3 sprays into both nostrils as needed for congestion.   traMADol 50 MG tablet Commonly known as: ULTRAM Take 50 mg by mouth every 6 (six) hours as needed for moderate pain.   traZODone 50 MG tablet Commonly known as: DESYREL Take 50 mg by mouth at bedtime as needed for sleep.   Trelegy Ellipta 100-62.5-25 MCG/INH Aepb Generic drug:  Fluticasone-Umeclidin-Vilant USE 1 INHALATION ORALLY    DAILY        Signature:  Chesley Mires, MD [place] Pager - [phone] 02/06/2021, 3:29 PM

## 2021-02-06 NOTE — Patient Instructions (Signed)
Will call with results of Bipap report  Can try astepro to help with nasal congestion and cough  Follow up in 4 months

## 2021-02-06 NOTE — Telephone Encounter (Signed)
Called [facility] this morning during patients OV to try and get an update on DME switch for O2 to [employer] from [place].(Last update: 01/24/21 OV notes received by [place] from [place]) Waiting on a call back from [place] to further discuss. Advised the patient during OV that I would call her with the update I received from [place].

## 2021-02-13 ENCOUNTER — Telehealth: Payer: Self-pay | Admitting: Pulmonary Disease

## 2021-02-13 NOTE — Telephone Encounter (Signed)
LMTCB. Please see phone note from 02/06/21. If Debbie calls back please get someone from Triage to take the call.

## 2021-02-15 NOTE — Telephone Encounter (Signed)
LMTCB for Debbie and will close per protocol.

## 2021-03-06 ENCOUNTER — Other Ambulatory Visit (HOSPITAL_COMMUNITY): Payer: Self-pay | Admitting: Internal Medicine

## 2021-03-06 DIAGNOSIS — M25552 Pain in left hip: Secondary | ICD-10-CM

## 2021-03-09 ENCOUNTER — Ambulatory Visit (HOSPITAL_COMMUNITY)
Admission: RE | Admit: 2021-03-09 | Discharge: 2021-03-09 | Disposition: A | Discharge status: Home / Self Care | Payer: Medicare Other | Source: Ambulatory Visit | Attending: Internal Medicine | Admitting: Internal Medicine

## 2021-03-09 ENCOUNTER — Other Ambulatory Visit: Payer: Self-pay

## 2021-03-09 DIAGNOSIS — M25552 Pain in left hip: Secondary | ICD-10-CM | POA: Diagnosis not present

## 2021-03-19 DIAGNOSIS — M25552 Pain in left hip: Secondary | ICD-10-CM | POA: Diagnosis not present

## 2021-04-04 DIAGNOSIS — J449 Chronic obstructive pulmonary disease, unspecified: Secondary | ICD-10-CM | POA: Diagnosis not present

## 2021-04-04 DIAGNOSIS — J9611 Chronic respiratory failure with hypoxia: Secondary | ICD-10-CM | POA: Diagnosis not present

## 2021-04-04 DIAGNOSIS — I2510 Atherosclerotic heart disease of native coronary artery without angina pectoris: Secondary | ICD-10-CM | POA: Diagnosis not present

## 2021-04-17 ENCOUNTER — Ambulatory Visit: Payer: Medicare Other | Admitting: Cardiology

## 2021-05-04 DIAGNOSIS — I2510 Atherosclerotic heart disease of native coronary artery without angina pectoris: Secondary | ICD-10-CM | POA: Diagnosis not present

## 2021-05-04 DIAGNOSIS — J449 Chronic obstructive pulmonary disease, unspecified: Secondary | ICD-10-CM | POA: Diagnosis not present

## 2021-05-04 DIAGNOSIS — J9611 Chronic respiratory failure with hypoxia: Secondary | ICD-10-CM | POA: Diagnosis not present

## 2021-05-15 NOTE — Progress Notes (Signed)
Cardiology Office Note  Date: 05/16/2021   ID: [name], DOB 08-20-1955, MRN [id number]  PCP:  Carylon Perches, MD  Cardiologist:  Nona Dell, MD Electrophysiologist:  None   Chief Complaint  Patient presents with   Cardiac follow-up    History of Present Illness: [name] is a 66 y.o. female last seen in June 2022.  She is here today with a caregiver for routine follow-up.  Reports no major change in dyspnea on exertion, stable NYHA class III.  She remains on supplemental oxygen as before. Follow-up visit with [name] noted in October 2022, I reviewed the note.  I reviewed her medications which are noted below.  She remains on Cardizem CD 240 mg daily, has not had to use any short acting Cardizem.  Reports stable frequency of palpitations.  I personally reviewed her ECG today which shows sinus rhythm with nonspecific ST-T changes.  She just had lab work with pending follow-up per Dr. Ouida Sills.  Past Medical History:  Diagnosis Date   Allergy    Anxiety    Arthritis    COPD (chronic obstructive pulmonary disease) (HCC)    Home oxygen at [facility]   Depression    Diverticulosis    History of cardiac catheterization    No significant CAD 2005   Hyperlipidemia    Hypoglycemia    IBS (irritable bowel syndrome)    Neuromuscular disorder (HCC)    Osteoporosis    Pneumonia 2015   Shingles 2012   Sleep apnea    SVT (supraventricular tachycardia) (HCC)     Past Surgical History:  Procedure Laterality Date   BACK SURGERY     BREAST SURGERY     CARDIAC CATHETERIZATION  2004   COLONOSCOPY  03/22/2011   Procedure: COLONOSCOPY;  Surgeon: Malissa Hippo, MD;  [facility];  Service: Endoscopy;  Laterality: N/A;   COLONOSCOPY WITH PROPOFOL N/A 05/16/2017   Procedure: COLONOSCOPY WITH PROPOFOL;  Surgeon: Malissa Hippo, MD;  [facility];  Service: Endoscopy;  Laterality: N/A;   ESOPHAGEAL DILATION N/A 04/19/2020   Procedure: ESOPHAGEAL DILATION;   Surgeon: Malissa Hippo, MD;  [facility];  Service: Endoscopy;  Laterality: N/A;   ESOPHAGOGASTRODUODENOSCOPY (EGD) WITH PROPOFOL N/A 04/19/2020   Procedure: ESOPHAGOGASTRODUODENOSCOPY (EGD) WITH PROPOFOL;  Surgeon: Malissa Hippo, MD;  [facility];  Service: Endoscopy;  Laterality: N/A;  9:15   FLEXIBLE SIGMOIDOSCOPY N/A 09/26/2017   Procedure: FLEXIBLE SIGMOIDOSCOPY with Propofol ;  Surgeon: Malissa Hippo, MD;  [facility];  Service: Endoscopy;  Laterality: N/A;  9:30   POLYPECTOMY  05/16/2017   Procedure: POLYPECTOMY;  Surgeon: Malissa Hippo, MD;  [facility];  Service: Endoscopy;;   SPINAL FUSION      Current Outpatient Medications  Medication Sig Dispense Refill   acyclovir (ZOVIRAX) 800 MG tablet Take 800 mg by mouth 5 (five) times daily as needed (for break outs).      ALPRAZolam (XANAX) 0.25 MG tablet Take 0.25 mg by mouth 3 (three) times daily as needed for anxiety.      aspirin 81 MG tablet Take 1 tablet (81 mg total) by mouth daily. 100 tablet 3   atorvastatin (LIPITOR) 40 MG tablet TAKE 1 TABLET DAILY 90 tablet 3   Azelastine HCl (ASTEPRO) 0.15 % SOLN Place 1 spray into the nose in the morning and at bedtime.     carboxymethylcellulose 1 % ophthalmic solution Place 2  drops into both eyes 3 (three) times daily as needed (for dry eyes).     cetirizine (ZYRTEC) 10 MG tablet Take 10 mg by mouth daily.     clonazePAM (KLONOPIN) 0.5 MG tablet Take 1 tablet (0.5 mg total) by mouth at bedtime. (Patient taking differently: Take 0.5 mg by mouth at bedtime as needed for anxiety.) 30 tablet 5   diltiazem (CARDIZEM CD) 240 MG 24 hr capsule TAKE 1 CAPSULE DAILY 90 capsule 3   diltiazem (CARDIZEM) 30 MG tablet Take 1-2 tablets every 6 hours as needed for persistent tachycardai 90 tablet 3   escitalopram (LEXAPRO) 20 MG tablet Take 20 mg by mouth daily.     fluticasone (FLONASE) 50 MCG/ACT nasal spray Place 1 spray into both nostrils daily.      Fluticasone-Umeclidin-Vilant (TRELEGY ELLIPTA) 100-62.5-25 MCG/INH AEPB USE 1 INHALATION ORALLY    DAILY 180 each 3   lansoprazole (PREVACID) 15 MG capsule Take 1 capsule (15 mg total) by mouth 2 (two) times daily before a meal. 180 capsule 3   montelukast (SINGULAIR) 10 MG tablet Take 1 tablet (10 mg total) by mouth at bedtime. 30 tablet 1   oxybutynin (DITROPAN) 5 MG tablet Take 5 mg by mouth daily.      OXYGEN Inhale 4 L into the lungs continuous.     roflumilast (DALIRESP) 500 MCG TABS tablet Take 500 mcg by mouth daily.     sodium chloride (OCEAN) 0.65 % SOLN nasal spray Place 2-3 sprays into both nostrils as needed for congestion.     traMADol (ULTRAM) 50 MG tablet Take 50 mg by mouth every 6 (six) hours as needed for moderate pain.      traZODone (DESYREL) 50 MG tablet Take 50 mg by mouth at bedtime as needed for sleep.     dicyclomine (BENTYL) 10 MG capsule Take 1 capsule (10 mg total) by mouth 3 (three) times daily as needed for spasms. (Patient not taking: Reported on 05/16/2021) 270 capsule 3   methocarbamol (ROBAXIN) 500 MG tablet Take 1 tablet (500 mg total) by mouth 2 (two) times daily. (Patient not taking: Reported on 05/16/2021) 20 tablet 0   ondansetron (ZOFRAN) 4 MG tablet Take 1 tablet (4 mg total) every 8 (eight) hours as needed by mouth for nausea or vomiting. (Patient not taking: Reported on 05/16/2021) 30 tablet 0   No current facility-administered medications for this visit.   Allergies:  Iohexol, Neosporin [neomycin-bacitracin zn-polymyx], Adhesive [tape], Chantix [varenicline], Ketek [telithromycin], and Penicillins   ROS: No orthopnea or PND.  Physical Exam: VS:  BP 120/68    Pulse 85    Ht 5\' 1"  (1.549 m)    Wt 137 lb (62.1 kg)    SpO2 99%    BMI 25.89 kg/m , BMI Body mass index is 25.89 kg/m.  Wt Readings from Last 3 Encounters:  05/16/21 137 lb (62.1 kg)  02/06/21 137 lb 0.6 oz (62.2 kg)  10/13/20 132 lb (59.9 kg)    General: Patient appears comfortable at  rest.  Wearing oxygen via nasal cannula. HEENT: Conjunctiva and lids normal, wearing a mask. Neck: Supple, no elevated JVP or carotid bruits, no thyromegaly. Lungs: Decreased breath sounds throughout, no wheezing. Cardiac: Distant regular heart sounds, no S3 or significant systolic murmur, no pericardial rub. Extremities: No pitting edema.  ECG:  An ECG dated 03/29/2020 was personally reviewed today and demonstrated:  Sinus tachycardia with nonspecific ST changes.  Recent Labwork:  No interval lab work for review today.  Other Studies Reviewed Today:  Echocardiogram 10/31/2020:  1. Left ventricular ejection fraction, by estimation, is 55 to 60%. The  left ventricle has normal function. The left ventricle has no regional  wall motion abnormalities. Left ventricular diastolic parameters are  consistent with Grade I diastolic  dysfunction (impaired relaxation).   2. Right ventricular systolic function is normal. The right ventricular  size is normal.   3. The mitral valve is normal in structure. No evidence of mitral valve  regurgitation. No evidence of mitral stenosis.   4. The aortic valve has an indeterminant number of cusps. Aortic valve  regurgitation is not visualized. No aortic stenosis is present.   5. The inferior vena cava is normal in size with greater than 50%  respiratory variability, suggesting right atrial pressure of 3 mmHg.   Assessment and Plan:  1.  PSVT.  No change in frequency of palpitations or increasing use of short acting Cardizem.  Continue Cardizem CD 240 mg daily with observation.  ECG reviewed.  2.  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  She continues to follow with [name].  3.  Mixed hyperlipidemia, on Lipitor with followed by Dr. Ouida Sills.  Medication Adjustments/Labs and Tests Ordered: Current medicines are reviewed at length with the patient today.  Concerns regarding medicines are outlined above.   Tests  Ordered: Orders Placed This Encounter  Procedures   EKG 12-Lead    Medication Changes: No orders of the defined types were placed in this encounter.   Disposition:  Follow up  6 months.  Signed, Jonelle Sidle, MD, [facility] 05/16/2021 11:47 AM    [facility] at [facility] [address] [place], [place] [address] Phone: [phone]; Fax: [phone]

## 2021-05-16 ENCOUNTER — Other Ambulatory Visit: Payer: Self-pay

## 2021-05-16 ENCOUNTER — Encounter: Payer: Self-pay | Admitting: Cardiology

## 2021-05-16 ENCOUNTER — Ambulatory Visit (INDEPENDENT_AMBULATORY_CARE_PROVIDER_SITE_OTHER): Payer: Medicare Other | Admitting: Cardiology

## 2021-05-16 VITALS — BP 120/68 | HR 85 | Ht 61.0 in | Wt 137.0 lb

## 2021-05-16 DIAGNOSIS — I471 Supraventricular tachycardia: Secondary | ICD-10-CM

## 2021-05-16 DIAGNOSIS — E782 Mixed hyperlipidemia: Secondary | ICD-10-CM | POA: Diagnosis not present

## 2021-05-16 DIAGNOSIS — F419 Anxiety disorder, unspecified: Secondary | ICD-10-CM | POA: Diagnosis not present

## 2021-05-16 DIAGNOSIS — J449 Chronic obstructive pulmonary disease, unspecified: Secondary | ICD-10-CM | POA: Diagnosis not present

## 2021-05-16 DIAGNOSIS — R7303 Prediabetes: Secondary | ICD-10-CM | POA: Diagnosis not present

## 2021-05-16 NOTE — Patient Instructions (Addendum)

## 2021-05-20 ENCOUNTER — Other Ambulatory Visit: Payer: Self-pay | Admitting: Cardiology

## 2021-05-21 ENCOUNTER — Other Ambulatory Visit (HOSPITAL_COMMUNITY): Payer: Self-pay | Admitting: Internal Medicine

## 2021-05-21 DIAGNOSIS — R7309 Other abnormal glucose: Secondary | ICD-10-CM | POA: Diagnosis not present

## 2021-05-21 DIAGNOSIS — M8700 Idiopathic aseptic necrosis of unspecified bone: Secondary | ICD-10-CM | POA: Diagnosis not present

## 2021-05-21 DIAGNOSIS — Z780 Asymptomatic menopausal state: Secondary | ICD-10-CM

## 2021-05-21 DIAGNOSIS — M810 Age-related osteoporosis without current pathological fracture: Secondary | ICD-10-CM | POA: Diagnosis not present

## 2021-05-24 ENCOUNTER — Encounter (HOSPITAL_COMMUNITY): Payer: Self-pay

## 2021-05-24 ENCOUNTER — Other Ambulatory Visit (HOSPITAL_COMMUNITY): Payer: Medicare Other

## 2021-06-20 ENCOUNTER — Telehealth (INDEPENDENT_AMBULATORY_CARE_PROVIDER_SITE_OTHER): Payer: Self-pay | Admitting: *Deleted

## 2021-06-20 NOTE — Telephone Encounter (Signed)
PA for lansoprazole 15mg  capsule submitted through cover my meds. Med is approved through 05/21/21 - 06/20/22. Letter of approval faxed to patient's pharmacy - [facility].

## 2021-07-26 DIAGNOSIS — J9611 Chronic respiratory failure with hypoxia: Secondary | ICD-10-CM | POA: Diagnosis not present

## 2021-07-26 DIAGNOSIS — G5690 Unspecified mononeuropathy of unspecified upper limb: Secondary | ICD-10-CM | POA: Diagnosis not present

## 2021-07-30 ENCOUNTER — Ambulatory Visit: Payer: Medicare Other | Admitting: Pulmonary Disease

## 2021-07-31 ENCOUNTER — Ambulatory Visit: Payer: Medicare Other | Admitting: Pulmonary Disease

## 2021-08-06 ENCOUNTER — Ambulatory Visit (INDEPENDENT_AMBULATORY_CARE_PROVIDER_SITE_OTHER): Payer: Medicare Other | Admitting: Gastroenterology

## 2021-08-06 ENCOUNTER — Encounter (INDEPENDENT_AMBULATORY_CARE_PROVIDER_SITE_OTHER): Payer: Self-pay | Admitting: Gastroenterology

## 2021-08-06 VITALS — BP 114/73 | HR 97 | Temp 98.9°F | Ht 61.0 in | Wt 134.0 lb

## 2021-08-06 DIAGNOSIS — R140 Abdominal distension (gaseous): Secondary | ICD-10-CM

## 2021-08-06 DIAGNOSIS — K219 Gastro-esophageal reflux disease without esophagitis: Secondary | ICD-10-CM | POA: Diagnosis not present

## 2021-08-06 DIAGNOSIS — R1084 Generalized abdominal pain: Secondary | ICD-10-CM

## 2021-08-06 DIAGNOSIS — Z8719 Personal history of other diseases of the digestive system: Secondary | ICD-10-CM

## 2021-08-06 DIAGNOSIS — R1032 Left lower quadrant pain: Secondary | ICD-10-CM | POA: Diagnosis not present

## 2021-08-06 DIAGNOSIS — R1319 Other dysphagia: Secondary | ICD-10-CM | POA: Diagnosis not present

## 2021-08-06 DIAGNOSIS — K921 Melena: Secondary | ICD-10-CM

## 2021-08-06 DIAGNOSIS — K5732 Diverticulitis of large intestine without perforation or abscess without bleeding: Secondary | ICD-10-CM

## 2021-08-06 MED ORDER — DICYCLOMINE HCL 10 MG PO CAPS: 10.0000 mg | ORAL_CAPSULE | Freq: Three times a day (TID) | ORAL | 2 refills | Status: DC | PRN

## 2021-08-06 NOTE — Patient Instructions (Signed)
I have sent a refill of dicyclomine for you to take up to 3x/day ?I am getting you set up for CT scan Without contrast due to question of contrast allergy in the past ?We will plan to set up EGD for swallowing issues as well, however, I will wait to see what CT shows as you may also benefit from colonoscopy if no acute findings on the CT. I will be in touch once I have these results and we will get you scheduled for EGD +/- Colonoscopy then. ? ?Please let me know if you have new or worsening symptoms ?

## 2021-08-06 NOTE — Progress Notes (Signed)
? ?Referring Provider: Asencion Noble, MD ?Primary Care Physician:  Asencion Noble, MD ?Primary GI Physician: Laural Golden ? ?Chief Complaint  ?Patient presents with  ? Irritable Bowel Syndrome  ?  Patient having mainly diarrhea and some constipation, abdominal pain, bloating, foul gas, very little nausea, burning in esophagus, food and meds have gotten stuck.  ? ?HPI:   ?[name] is a 66 y.o. female with past medical history of recurrent diverticulitis  ? ?Patient presenting today for diarrhea, abdominal pain, bloating, burning in esophagus.  ? ?Last seen July 2021, history of recurrent sigmoid diverticulitis. ? ?Today she states that she has bloating, pain that goes from her esophagus down to her lower abdomen. She states that previously when she had a BM it  looked like bloody tissue mixed in with her stools, this went on for a few weeks. She also states that stools have a very foul smell, and flatus does as well. Has not had any blood in stools for the past few days. Denies melena. Denies vomiting, has had some nausea. Appetite has decreased some recently since she had these symptoms. She states that foods and pills feels as though they get stuck at the sternal notch, this may occur 5 days in a row then she may go a few weeks without any issues. She has some reflux, almost daily. Also endorses bloating. She is on prevacid '15mg'$  once a day, previously on BID dosing, unable to recall if symptoms were more controlled on BID dosing. Denies early satiety. She states that stools are soft and sometimes more loose. Has some occasional LLQ pain maybe 1-2x/week.  ?She has hx of recurrent shingles that she will get a fever with. She notes recurrences happen more with stress, otherwise has had no fevers or chills. Denies weight loss.  ? ?Last Colonoscopy:05/16/17 - Perianal skin tag found on perianal exam. ?- One 6 mm polyp at the hepatic flexure-tubular adenoma ?- One 7 mm polyp at the hepatic flexure-tubular adenoma ?-  Diverticulosis in the sigmoid colon. ?- Purulent discharge and blood was seen in association with the diverticular opening, ?indicative of diverticulitis. ?- External hemorrhoids. ?Flex sig: 09/26/17: - Preparation of the colon was inadequate. ?- Perianal skin tag found on perianal exam. ?- Stricture in the sigmoid colon. ?- Diverticulosis in the sigmoid colon and in the distal sigmoid colon. ?- The rectum is normal. ?- External hemorrhoids. ?- No specimens collected. ?Last Endoscopy:04/19/20- Normal hypopharynx. ?- Ectopic gastric mucosa in the proximal esophagus. ?- Z-line irregular, 39 cm from the incisors. Biopsied-negative for barretts, changes of GERD ?- 2 cm hiatal hernia. ?- No endoscopic esophageal abnormality to explain patient's dysphagia. Esophagus dilated. ?Dilated. ?- Gastritis. Biopsied. Neg for H pylori ?- Non-bleeding duodenal ulcer with no stigmata of bleeding. ?- Normal second portion of the duodenum. ? ?Recommendations:  ?Repeat colonosocopy in jan 2026 ? ?Past Medical History:  ?Diagnosis Date  ? Allergy   ? Anxiety   ? Arthritis   ? COPD (chronic obstructive pulmonary disease) ([place])   ? Home oxygen at [facility]  ? Depression   ? Diverticulosis   ? History of cardiac catheterization   ? No significant CAD 2005  ? Hyperlipidemia   ? Hypoglycemia   ? IBS (irritable bowel syndrome)   ? Neuromuscular disorder ([place])   ? Osteoporosis   ? Pneumonia 2015  ? Shingles 2012  ? Sleep apnea   ? SVT (supraventricular tachycardia) ([place])   ? ? ?Past Surgical History:  ?Procedure Laterality Date  ?  BACK SURGERY    ? BREAST SURGERY    ? CARDIAC CATHETERIZATION  2004  ? COLONOSCOPY  03/22/2011  ? Procedure: COLONOSCOPY;  Surgeon: Rogene Houston, MD;  [facility];  Service: Endoscopy;  Laterality: N/A;  ? COLONOSCOPY WITH PROPOFOL N/A 05/16/2017  ? Procedure: COLONOSCOPY WITH PROPOFOL;  Surgeon: Rogene Houston, MD;  [facility];  Service: Endoscopy;  Laterality: N/A;  ? ESOPHAGEAL DILATION N/A  04/19/2020  ? Procedure: ESOPHAGEAL DILATION;  Surgeon: Rogene Houston, MD;  [facility];  Service: Endoscopy;  Laterality: N/A;  ? ESOPHAGOGASTRODUODENOSCOPY (EGD) WITH PROPOFOL N/A 04/19/2020  ? Procedure: ESOPHAGOGASTRODUODENOSCOPY (EGD) WITH PROPOFOL;  Surgeon: Rogene Houston, MD;  [facility];  Service: Endoscopy;  Laterality: N/A;  9:15  ? FLEXIBLE SIGMOIDOSCOPY N/A 09/26/2017  ? Procedure: FLEXIBLE SIGMOIDOSCOPY with Propofol ;  Surgeon: Rogene Houston, MD;  [facility];  Service: Endoscopy;  Laterality: N/A;  9:30  ? POLYPECTOMY  05/16/2017  ? Procedure: POLYPECTOMY;  Surgeon: Rogene Houston, MD;  [facility];  Service: Endoscopy;;  ? SPINAL FUSION    ? ? ?Current Outpatient Medications  ?Medication Sig Dispense Refill  ? ALPRAZolam (XANAX) 0.25 MG tablet Take 0.25 mg by mouth 3 (three) times daily as needed for anxiety.     ? aspirin 81 MG tablet Take 1 tablet (81 mg total) by mouth daily. 100 tablet 3  ? atorvastatin (LIPITOR) 40 MG tablet TAKE 1 TABLET DAILY 90 tablet 3  ? carboxymethylcellulose 1 % ophthalmic solution Place 2 drops into both eyes 3 (three) times daily as needed (for dry eyes).    ? cetirizine (ZYRTEC) 10 MG tablet Take 10 mg by mouth daily.    ? clonazePAM (KLONOPIN) 0.5 MG tablet Take 1 tablet (0.5 mg total) by mouth at bedtime. (Patient taking differently: Take 0.5 mg by mouth at bedtime as needed for anxiety.) 30 tablet 5  ? dicyclomine (BENTYL) 10 MG capsule Take 1 capsule (10 mg total) by mouth 3 (three) times daily as needed for spasms. 270 capsule 3  ? diltiazem (CARDIZEM CD) 240 MG 24 hr capsule TAKE 1 CAPSULE DAILY 90 capsule 3  ? diltiazem (CARDIZEM) 30 MG tablet Take 1-2 tablets every 6 hours as needed for persistent tachycardai 90 tablet 3  ? escitalopram (LEXAPRO) 20 MG tablet Take 20 mg by mouth daily.    ? fluticasone (FLONASE) 50 MCG/ACT nasal spray Place 1 spray into both nostrils daily.    ?  Fluticasone-Umeclidin-Vilant (TRELEGY ELLIPTA) 100-62.5-25 MCG/INH AEPB USE 1 INHALATION ORALLY    DAILY 180 each 3  ? lansoprazole (PREVACID) 15 MG capsule Take 1 capsule (15 mg total) by mouth 2 (two) times daily before a meal. 180 capsule 3  ? montelukast (SINGULAIR) 10 MG tablet Take 1 tablet (10 mg total) by mouth at bedtime. 30 tablet 1  ? oxybutynin (DITROPAN) 5 MG tablet Take 5 mg by mouth daily.     ? OXYGEN Inhale 4 L into the lungs continuous.    ? roflumilast (DALIRESP) 500 MCG TABS tablet Take 500 mcg by mouth daily.    ? sodium chloride (OCEAN) 0.65 % SOLN nasal spray Place 2-3 sprays into both nostrils as needed for congestion.    ? traMADol (ULTRAM) 50 MG tablet Take 50 mg by mouth every 6 (six) hours as needed for moderate pain.     ? traZODone (DESYREL) 50 MG tablet Take 50 mg by mouth at  bedtime as needed for sleep.    ? ?No current facility-administered medications for this visit.  ? ? ?Allergies as of 08/06/2021 - Review Complete 05/16/2021  ?Allergen Reaction Noted  ? Iohexol Anaphylaxis and Other (See Comments) 06/12/2006  ? Neosporin [neomycin-bacitracin zn-polymyx] Itching 05/13/2017  ? Adhesive [tape] Itching and Other (See Comments) 05/05/2017  ? Chantix [varenicline] Other (See Comments) 08/19/2012  ? Ketek [telithromycin] Other (See Comments) 08/13/2013  ? Penicillins Other (See Comments) 01/29/2011  ? ? ?Family History  ?Problem Relation Age of Onset  ? Asthma Mother   ? Heart attack Mother   ? Diabetes Mother   ? Heart failure Mother   ? Colon cancer Neg Hx   ? ? ?Social History  ? ?Socioeconomic History  ? Marital status: Divorced  ?  Spouse name: Not on file  ? Number of children: Not on file  ? Years of education: Not on file  ? Highest education level: Not on file  ?Occupational History  ? Not on file  ?Tobacco Use  ? Smoking status: Former  ?  Packs/day: 1.00  ?  Years: 40.00  ?  Pack years: 40.00  ?  Types: Cigarettes  ?  Quit date: 05/07/2015  ?  Years since quitting: 6.2  ?   Passive exposure: Past  ? Smokeless tobacco: Never  ?Vaping Use  ? Vaping Use: Former  ?Substance and Sexual Activity  ? Alcohol use: Yes  ?  Comment: 1 -2 glasses of wine daily  ? Drug use: No  ? Sexual activity: Never  ?Other Topics Concer

## 2021-08-07 DIAGNOSIS — R140 Abdominal distension (gaseous): Secondary | ICD-10-CM | POA: Insufficient documentation

## 2021-08-07 DIAGNOSIS — K219 Gastro-esophageal reflux disease without esophagitis: Secondary | ICD-10-CM | POA: Insufficient documentation

## 2021-08-07 DIAGNOSIS — R1319 Other dysphagia: Secondary | ICD-10-CM | POA: Insufficient documentation

## 2021-08-07 DIAGNOSIS — R1032 Left lower quadrant pain: Secondary | ICD-10-CM | POA: Insufficient documentation

## 2021-08-08 ENCOUNTER — Telehealth (INDEPENDENT_AMBULATORY_CARE_PROVIDER_SITE_OTHER): Payer: Self-pay

## 2021-08-08 NOTE — Telephone Encounter (Signed)
Chaska Hagger Ann Yaacov Koziol, CMA  ?

## 2021-08-08 NOTE — Telephone Encounter (Signed)
I spoke to [name] this morning to give her her CT scan appointment and she proceeded to tell me she feels like she may have a fistula coming from the sigmoid colon ?A colovesical fistula she stated she forgot to mention that to you when she was in the office , please advise ?  ?

## 2021-08-17 ENCOUNTER — Ambulatory Visit (HOSPITAL_BASED_OUTPATIENT_CLINIC_OR_DEPARTMENT_OTHER)
Admission: RE | Admit: 2021-08-17 | Discharge: 2021-08-17 | Disposition: A | Discharge status: Home / Self Care | Payer: Medicare Other | Source: Ambulatory Visit | Attending: Gastroenterology | Admitting: Gastroenterology

## 2021-08-17 DIAGNOSIS — Z8719 Personal history of other diseases of the digestive system: Secondary | ICD-10-CM | POA: Diagnosis not present

## 2021-08-17 DIAGNOSIS — K6389 Other specified diseases of intestine: Secondary | ICD-10-CM | POA: Diagnosis not present

## 2021-08-17 DIAGNOSIS — N3289 Other specified disorders of bladder: Secondary | ICD-10-CM | POA: Diagnosis not present

## 2021-08-20 ENCOUNTER — Telehealth (INDEPENDENT_AMBULATORY_CARE_PROVIDER_SITE_OTHER): Payer: Self-pay | Admitting: *Deleted

## 2021-08-20 NOTE — Telephone Encounter (Signed)
Opal Sidles from [facility] radiology calling to give report of ct scan that was done over weekend. See impression below and also report final in epic for review.  ? ?IMPRESSION: ?Annular thickening of the sigmoid colon suspicious for malignancy. ?Further evaluation with mass could be recommended ? ?

## 2021-08-21 ENCOUNTER — Telehealth (INDEPENDENT_AMBULATORY_CARE_PROVIDER_SITE_OTHER): Payer: Self-pay

## 2021-08-21 ENCOUNTER — Other Ambulatory Visit (INDEPENDENT_AMBULATORY_CARE_PROVIDER_SITE_OTHER): Payer: Self-pay

## 2021-08-21 ENCOUNTER — Encounter (INDEPENDENT_AMBULATORY_CARE_PROVIDER_SITE_OTHER): Payer: Self-pay

## 2021-08-21 DIAGNOSIS — K6389 Other specified diseases of intestine: Secondary | ICD-10-CM

## 2021-08-21 DIAGNOSIS — R1310 Dysphagia, unspecified: Secondary | ICD-10-CM

## 2021-08-21 DIAGNOSIS — K625 Hemorrhage of anus and rectum: Secondary | ICD-10-CM

## 2021-08-21 MED ORDER — SUTAB 1479-225-188 MG PO TABS: 1.0000 | ORAL_TABLET | Freq: Once | ORAL | 0 refills | Status: AC

## 2021-08-21 NOTE — Telephone Encounter (Signed)
Blain Hunsucker Ann Mariesa Grieder, CMA  ?

## 2021-08-27 ENCOUNTER — Ambulatory Visit (INDEPENDENT_AMBULATORY_CARE_PROVIDER_SITE_OTHER): Payer: Medicare Other | Admitting: Pulmonary Disease

## 2021-08-27 ENCOUNTER — Encounter: Payer: Self-pay | Admitting: Pulmonary Disease

## 2021-08-27 VITALS — BP 132/76 | HR 64 | Temp 98.0°F | Ht 61.0 in | Wt 134.2 lb

## 2021-08-27 DIAGNOSIS — J9611 Chronic respiratory failure with hypoxia: Secondary | ICD-10-CM | POA: Diagnosis not present

## 2021-08-27 DIAGNOSIS — G4733 Obstructive sleep apnea (adult) (pediatric): Secondary | ICD-10-CM

## 2021-08-27 DIAGNOSIS — K5732 Diverticulitis of large intestine without perforation or abscess without bleeding: Secondary | ICD-10-CM | POA: Diagnosis not present

## 2021-08-27 DIAGNOSIS — J9612 Chronic respiratory failure with hypercapnia: Secondary | ICD-10-CM

## 2021-08-27 DIAGNOSIS — J449 Chronic obstructive pulmonary disease, unspecified: Secondary | ICD-10-CM | POA: Diagnosis not present

## 2021-08-27 DIAGNOSIS — R7303 Prediabetes: Secondary | ICD-10-CM | POA: Diagnosis not present

## 2021-08-27 DIAGNOSIS — J432 Centrilobular emphysema: Secondary | ICD-10-CM

## 2021-08-27 DIAGNOSIS — M87059 Idiopathic aseptic necrosis of unspecified femur: Secondary | ICD-10-CM | POA: Diagnosis not present

## 2021-08-27 DIAGNOSIS — Z79899 Other long term (current) drug therapy: Secondary | ICD-10-CM | POA: Diagnosis not present

## 2021-08-27 DIAGNOSIS — M810 Age-related osteoporosis without current pathological fracture: Secondary | ICD-10-CM | POA: Diagnosis not present

## 2021-08-27 NOTE — Patient Instructions (Signed)
Follow up in 6 months 

## 2021-08-27 NOTE — Progress Notes (Signed)
? ?[facility] ? ?Chief Complaint  ?Patient presents with  ? Follow-up  ?  Bipap working well  ?Has a cough  ? ? ?Constitutional:  ?BP 132/76 (BP Location: Left Arm, Patient Position: Sitting)   Pulse 64   Temp 98 ?F (36.7 ?C) (Temporal)   Ht '5\' 1"'$  (1.549 m)   Wt 134 lb 3.2 oz (60.9 kg)   SpO2 98% Comment: 4LO2 cont  BMI 25.36 kg/m?  ? ?Past Medical History:  ?Anxiety, OA, Depression, Diverticulosis, HLD, IBS, Osteoporosis, PNA, Shingles, SVT ? ?Past Surgical History:  ?Her  has a past surgical history that includes Spinal fusion; Back surgery; Colonoscopy (03/22/2011); Colonoscopy with propofol (N/A, 05/16/2017); polypectomy (05/16/2017); Cardiac catheterization (2004); Flexible sigmoidoscopy (N/A, 09/26/2017); Breast surgery; Esophagogastroduodenoscopy (egd) with propofol (N/A, 04/19/2020); and Esophageal dilation (N/A, 04/19/2020). ? ?Brief Summary:  ?[name] is a 66 y.o. female former smoker with COPD from emphysema and chronic respiratory failure with hypoxia. ?  ? ? ? ?Subjective:  ? ?She is here with her family. ? ?She has colonoscopy scheduled for next month.  Worried she has colon cancer. ? ?Breathing okay.  Has more thick phlegm, but is clear.  No wheeze, fever, chest pain, or hemoptysis.  Using 4 liters oxygen.  Using Bipap at night and naps for about an hour during the day. ? ?Physical Exam:  ? ?Appearance - well kempt, wearing oxygen ? ?ENMT - no sinus tenderness, no oral exudate, no LAN, Mallampati 2 airway, no stridor ? ?Respiratory - equal breath sounds bilaterally, no wheezing or rales ? ?CV - s1s2 regular rate and rhythm, no murmurs ? ?Ext - no clubbing, no edema ? ?Skin - no rashes ? ?Psych - normal mood and affect ? ?  ?Pulmonary testing:  ?Spirometry 12/31/11 >> FEV1 0.4 (18%), FEV1% 42 ? ?Chest Imaging:  ?CT chest 10/16/19 >> mod/severe centrilobular emphysema, granuloma RUL ? ?Sleep Tests:  ?Bipap titration study 05/16/14 >> Bipap 15/10 cm H2O  with 3 liters O2 ?Bipap 07/25/21 to 08/23/21 >> used on 30 of 30 days with average 13 hrs 1 min.  Average AHI 0.9 with Bipap 14/6 cm H2O ? ?Cardiac Tests:  ?Echo 10/31/20 >> EF 55 to 60%, grade 1 DD ? ?Social History:  ?She  reports that she quit smoking about 6 years ago. Her smoking use included cigarettes. She has a 40.00 pack-year smoking history. She has been exposed to tobacco smoke. She has never used smokeless tobacco. She reports current alcohol use. She reports that she does not use drugs. ? ?Family History:  ?Her family history includes Asthma in her mother; Diabetes in her mother; Heart attack in her mother; Heart failure in her mother. ?  ? ? ?Assessment/Plan:  ? ?Severe COPD with emphysema. ?- continue trelegy 100, daliresp ?- prn albuterol ?- she can try mucinex to help with cough and expectoration ? ?Obstructive sleep apnea. ?- she reports compliance with Bipap and benefit from therapy ?- uses [employer] as DME for Bipap ?- continue Bipap 14/6 cm H2O ? ?REM Sleep Behavior disorder. ?- clonazepam 0.5 mg qhs prn ? ?Chronic respiratory failure with hypoxia. ?- she uses 4 to 7 liters oxygen 24/7 ?- advised her to increase her O2 prior to doing exercise ?- uses [employer] as DME for oxygen set up, but is trying to switch to a different DME ? ?Allergic rhinitis. ?- will have her try [place] and nasacort on regular basis ?- continue zyrtec, singulair ?- prn astepro ? ?  Chronic sigmoid diverticulitis. ?- followed by Dr. Laural Golden with GI ?- discussed that she would be at high risk for perioperative respiratory complications if she needed to have surgery, and at that point it would be risk/benefit analysis and what her comfort level is with risk about proceeding with surgery ?- given the concern that she could have colon cancer she will need to proceed with colonoscopy; pulmonary service can be available as needed in the hospital after she has her procedure ? ?Time Spent Involved in Patient Care on Day of Examination:   ?35 minutes ? ?Follow up:  ? ?Patient Instructions  ?Follow up in 6 months ? ?Medication List:  ? ?Allergies as of 08/27/2021   ? ?   Reactions  ? Iohexol Anaphylaxis, Other (See Comments)  ?  Desc: IV DYE  ANAPHYLATIC SHOCK X2 ONCE AFTER PREMEDS  ? Neosporin [neomycin-bacitracin Zn-polymyx] Itching  ? Adhesive [tape] Itching, Other (See Comments)  ? Break out  ? Chantix [varenicline] Other (See Comments)  ? PALPITATIONS  ? Ketek [telithromycin] Other (See Comments)  ? Loopy, eyes went different directions  ? Penicillins Other (See Comments)  ? Unknown ?Has patient had a PCN reaction causing immediate rash, facial/tongue/throat swelling, SOB or lightheadedness with hypotension: Unknown ?Has patient had a PCN reaction causing severe rash involving mucus membranes or skin necrosis: Unknown ?Has patient had a PCN reaction that required hospitalization: Unknown ?Has patient had a PCN reaction occurring within the last 10 years: No ?If all of the above answers are "NO", then may proceed with Cephalosporin use.  ? ?  ? ?  ?Medication List  ?  ? ?  ? Accurate as of August 27, 2021 11:17 AM. If you have any questions, ask your nurse or doctor.  ?  ?  ? ?  ? ?ALPRAZolam 0.25 MG tablet ?Commonly known as: Duanne Moron ?Take 0.25 mg by mouth 3 (three) times daily as needed for anxiety. ?  ?aspirin 81 MG tablet ?Take 1 tablet (81 mg total) by mouth daily. ?  ?atorvastatin 40 MG tablet ?Commonly known as: LIPITOR ?TAKE 1 TABLET DAILY ?  ?carboxymethylcellulose 1 % ophthalmic solution ?Place 2 drops into both eyes 3 (three) times daily as needed (for dry eyes). ?  ?cetirizine 10 MG tablet ?Commonly known as: ZYRTEC ?Take 10 mg by mouth daily. ?  ?clonazePAM 0.5 MG tablet ?Commonly known as: KLONOPIN ?Take 1 tablet (0.5 mg total) by mouth at bedtime. ?What changed:  ?when to take this ?reasons to take this ?  ?dicyclomine 10 MG capsule ?Commonly known as: BENTYL ?Take 1 capsule (10 mg total) by mouth 3 (three) times daily as needed for  spasms. ?  ?diltiazem 240 MG 24 hr capsule ?Commonly known as: CARDIZEM CD ?TAKE 1 CAPSULE DAILY ?  ?diltiazem 30 MG tablet ?Commonly known as: Cardizem ?Take 1-2 tablets every 6 hours as needed for persistent tachycardai ?  ?escitalopram 20 MG tablet ?Commonly known as: LEXAPRO ?Take 20 mg by mouth daily. ?  ?fluticasone 50 MCG/ACT nasal spray ?Commonly known as: FLONASE ?Place 1 spray into both nostrils daily. ?  ?lansoprazole 15 MG capsule ?Commonly known as: Prevacid ?Take 1 capsule (15 mg total) by mouth 2 (two) times daily before a meal. ?  ?montelukast 10 MG tablet ?Commonly known as: SINGULAIR ?Take 1 tablet (10 mg total) by mouth at bedtime. ?  ?oxybutynin 5 MG tablet ?Commonly known as: DITROPAN ?Take 5 mg by mouth daily. ?  ?OXYGEN ?Inhale 4 L into the lungs continuous. ?  ?  roflumilast 500 MCG Tabs tablet ?Commonly known as: DALIRESP ?Take 500 mcg by mouth daily. ?  ?sodium chloride 0.65 % Soln nasal spray ?Commonly known as: OCEAN ?Place 2-3 sprays into both nostrils as needed for congestion. ?  ?traMADol 50 MG tablet ?Commonly known as: ULTRAM ?Take 50 mg by mouth every 6 (six) hours as needed for moderate pain. ?  ?traZODone 50 MG tablet ?Commonly known as: DESYREL ?Take 50 mg by mouth at bedtime as needed for sleep. ?  ?Trelegy Ellipta 100-62.5-25 MCG/ACT Aepb ?Generic drug: Fluticasone-Umeclidin-Vilant ?USE 1 INHALATION ORALLY    DAILY ?  ? ?  ? ? ?Signature:  ?Chesley Mires, MD ?[place] ?Pager - [phone] ?08/27/2021, 11:17 AM ?  ? ? ? ? ? ? ? ? ?

## 2021-09-03 DIAGNOSIS — R1904 Left lower quadrant abdominal swelling, mass and lump: Secondary | ICD-10-CM | POA: Diagnosis not present

## 2021-09-03 DIAGNOSIS — R7309 Other abnormal glucose: Secondary | ICD-10-CM | POA: Diagnosis not present

## 2021-09-03 DIAGNOSIS — M87052 Idiopathic aseptic necrosis of left femur: Secondary | ICD-10-CM | POA: Diagnosis not present

## 2021-09-07 NOTE — Patient Instructions (Signed)
? Your procedure is scheduled on: 09/12/2021 ? Report to [facility] at  11:00   AM. ? Call this number if you have problems the morning of surgery: [phone] ? ? Remember: ? ?            Follow Directions on the letter you received from Your Physician's office regarding the Bowel Prep ? ?            No Smoking the day of Procedure : ? ? Take these medicines the morning of surgery with A SIP OF WATER: xanax, Diltiazem, lexapro lansoprazole, roflumilast, and ditropan    hydrocodone, flonase, zyrtec and/or tramadol if needed ? ? Do not wear jewelry, make-up or nail polish. ?  ? Do not bring valuables to the hospital. ? Contacts, dentures or bridgework may not be worn into surgery. ? . ? ? Patients discharged the day of surgery will not be allowed to drive home. ?  ?  ?Colonoscopy, Adult, Care After ?This sheet gives you information about how to care for yourself after your procedure. Your health care provider may also give you more specific instructions. If you have problems or questions, contact your health care provider. ?What can I expect after the procedure? ?After the procedure, it is common to have: ?A small amount of blood in your stool for 24 hours after the procedure. ?Some gas. ?Mild abdominal cramping or bloating. ? ?Follow these instructions at home: ?General instructions ? ?For the first 24 hours after the procedure: ?Do not drive or use machinery. ?Do not sign important documents. ?Do not drink alcohol. ?Do your regular daily activities at a slower pace than normal. ?Eat soft, easy-to-digest foods. ?Rest often. ?Take over-the-counter or prescription medicines only as told by your health care provider. ?It is up to you to get the results of your procedure. Ask your health care provider, or the department performing the procedure, when your results will be ready. ?Relieving cramping and bloating ?Try walking around when you have cramps or feel bloated. ?Apply heat to your abdomen as told by  your health care provider. Use a heat source that your health care provider recommends, such as a moist heat pack or a heating pad. ?Place a towel between your skin and the heat source. ?Leave the heat on for 20-30 minutes. ?Remove the heat if your skin turns bright red. This is especially important if you are unable to feel pain, heat, or cold. You may have a greater risk of getting burned. ?Eating and drinking ?Drink enough fluid to keep your urine clear or pale yellow. ?Resume your normal diet as instructed by your health care provider. Avoid heavy or fried foods that are hard to digest. ?Avoid drinking alcohol for as long as instructed by your health care provider. ?Contact a health care provider if: ?You have blood in your stool 2-3 days after the procedure. ?Get help right away if: ?You have more than a small spotting of blood in your stool. ?You pass large blood clots in your stool. ?Your abdomen is swollen. ?You have nausea or vomiting. ?You have a fever. ?You have increasing abdominal pain that is not relieved with medicine. ?This information is not intended to replace advice given to you by your health care provider. Make sure you discuss any questions you have with your health care provider. ?Document Released: 12/05/2003 Document Revised: 01/15/2016 Document Reviewed: 07/04/2015 ?Elsevier Interactive Patient Education ? 2018 [place].  ?Upper Endoscopy, Adult, Care After ?This sheet gives you  information about how to care for yourself after your procedure. Your health care provider may also give you more specific instructions. If you have problems or questions, contact your health care provider. ?What can I expect after the procedure? ?After the procedure, it is common to have: ?A sore throat. ?Mild stomach pain or discomfort. ?Bloating. ?Nausea. ?Follow these instructions at home: ? ?Follow instructions from your health care provider about what to eat or drink after your procedure. ?Return to your  normal activities as told by your health care provider. Ask your health care provider what activities are safe for you. ?Take over-the-counter and prescription medicines only as told by your health care provider. ?If you were given a sedative during the procedure, it can affect you for several hours. Do not drive or operate machinery until your health care provider says that it is safe. ?Keep all follow-up visits as told by your health care provider. This is important. ?Contact a health care provider if you have: ?A sore throat that lasts longer than one day. ?Trouble swallowing. ?Get help right away if: ?You vomit blood or your vomit looks like coffee grounds. ?You have: ?A fever. ?Bloody, black, or tarry stools. ?A severe sore throat or you cannot swallow. ?Difficulty breathing. ?Severe pain in your chest or abdomen. ?Summary ?After the procedure, it is common to have a sore throat, mild stomach discomfort, bloating, and nausea. ?If you were given a sedative during the procedure, it can affect you for several hours. Do not drive or operate machinery until your health care provider says that it is safe. ?Follow instructions from your health care provider about what to eat or drink after your procedure. ?Return to your normal activities as told by your health care provider. ?This information is not intended to replace advice given to you by your health care provider. Make sure you discuss any questions you have with your health care provider. ?Document Revised: 02/26/2019 Document Reviewed: 09/22/2017 ?Elsevier Patient Education ? [place]. ? ?

## 2021-09-10 ENCOUNTER — Encounter (HOSPITAL_COMMUNITY)
Admission: RE | Admit: 2021-09-10 | Discharge: 2021-09-10 | Disposition: A | Discharge status: Home / Self Care | Payer: Medicare Other | Source: Ambulatory Visit | Attending: Internal Medicine | Admitting: Internal Medicine

## 2021-09-10 ENCOUNTER — Other Ambulatory Visit: Payer: Self-pay | Admitting: Cardiology

## 2021-09-10 NOTE — Progress Notes (Signed)
Ms Swift arrived via w/c for PAT.  Stated she felt bad over the weekend but does not want to go to the ED.  She is on 4L O2 via .  VS o2 sat 99%, HR 89, Resp 20 B/P 122/70.  EKG Strip NSR with occ PVC's.  Reviewed with Dr Briant Cedar. Instructed patient to come to ED if needed or reschedule procedure if she feels she cannot tolerate the prep at this time.  ?

## 2021-09-12 ENCOUNTER — Ambulatory Visit (HOSPITAL_COMMUNITY)
Admission: RE | Admit: 2021-09-12 | Discharge: 2021-09-12 | Disposition: A | Discharge status: Home / Self Care | Payer: Medicare Other | Attending: Internal Medicine | Admitting: Internal Medicine

## 2021-09-12 ENCOUNTER — Encounter (HOSPITAL_COMMUNITY)
Admission: RE | Disposition: A | Discharge status: Home / Self Care | Payer: Self-pay | Source: Home / Self Care | Attending: Internal Medicine

## 2021-09-12 ENCOUNTER — Ambulatory Visit (HOSPITAL_BASED_OUTPATIENT_CLINIC_OR_DEPARTMENT_OTHER): Payer: Medicare Other | Admitting: Anesthesiology

## 2021-09-12 ENCOUNTER — Encounter (HOSPITAL_COMMUNITY): Payer: Self-pay | Admitting: Internal Medicine

## 2021-09-12 ENCOUNTER — Ambulatory Visit (HOSPITAL_COMMUNITY): Payer: Medicare Other | Admitting: Anesthesiology

## 2021-09-12 DIAGNOSIS — Z9981 Dependence on supplemental oxygen: Secondary | ICD-10-CM | POA: Diagnosis not present

## 2021-09-12 DIAGNOSIS — K219 Gastro-esophageal reflux disease without esophagitis: Secondary | ICD-10-CM | POA: Diagnosis not present

## 2021-09-12 DIAGNOSIS — R1314 Dysphagia, pharyngoesophageal phase: Secondary | ICD-10-CM | POA: Diagnosis not present

## 2021-09-12 DIAGNOSIS — G4730 Sleep apnea, unspecified: Secondary | ICD-10-CM | POA: Diagnosis not present

## 2021-09-12 DIAGNOSIS — K2289 Other specified disease of esophagus: Secondary | ICD-10-CM

## 2021-09-12 DIAGNOSIS — J449 Chronic obstructive pulmonary disease, unspecified: Secondary | ICD-10-CM | POA: Diagnosis not present

## 2021-09-12 DIAGNOSIS — K449 Diaphragmatic hernia without obstruction or gangrene: Secondary | ICD-10-CM | POA: Insufficient documentation

## 2021-09-12 DIAGNOSIS — K3189 Other diseases of stomach and duodenum: Secondary | ICD-10-CM | POA: Diagnosis not present

## 2021-09-12 DIAGNOSIS — F419 Anxiety disorder, unspecified: Secondary | ICD-10-CM | POA: Insufficient documentation

## 2021-09-12 DIAGNOSIS — K5732 Diverticulitis of large intestine without perforation or abscess without bleeding: Secondary | ICD-10-CM | POA: Insufficient documentation

## 2021-09-12 DIAGNOSIS — G709 Myoneural disorder, unspecified: Secondary | ICD-10-CM | POA: Insufficient documentation

## 2021-09-12 DIAGNOSIS — F32A Depression, unspecified: Secondary | ICD-10-CM | POA: Insufficient documentation

## 2021-09-12 DIAGNOSIS — K635 Polyp of colon: Secondary | ICD-10-CM | POA: Diagnosis not present

## 2021-09-12 DIAGNOSIS — K56699 Other intestinal obstruction unspecified as to partial versus complete obstruction: Secondary | ICD-10-CM | POA: Insufficient documentation

## 2021-09-12 DIAGNOSIS — R933 Abnormal findings on diagnostic imaging of other parts of digestive tract: Secondary | ICD-10-CM | POA: Diagnosis not present

## 2021-09-12 DIAGNOSIS — K644 Residual hemorrhoidal skin tags: Secondary | ICD-10-CM | POA: Diagnosis not present

## 2021-09-12 DIAGNOSIS — Z7982 Long term (current) use of aspirin: Secondary | ICD-10-CM | POA: Insufficient documentation

## 2021-09-12 DIAGNOSIS — K6389 Other specified diseases of intestine: Secondary | ICD-10-CM | POA: Diagnosis not present

## 2021-09-12 DIAGNOSIS — K5730 Diverticulosis of large intestine without perforation or abscess without bleeding: Secondary | ICD-10-CM | POA: Insufficient documentation

## 2021-09-12 DIAGNOSIS — R1310 Dysphagia, unspecified: Secondary | ICD-10-CM

## 2021-09-12 DIAGNOSIS — D649 Anemia, unspecified: Secondary | ICD-10-CM | POA: Diagnosis not present

## 2021-09-12 DIAGNOSIS — K319 Disease of stomach and duodenum, unspecified: Secondary | ICD-10-CM | POA: Diagnosis not present

## 2021-09-12 DIAGNOSIS — D123 Benign neoplasm of transverse colon: Secondary | ICD-10-CM | POA: Diagnosis not present

## 2021-09-12 DIAGNOSIS — Z87891 Personal history of nicotine dependence: Secondary | ICD-10-CM | POA: Diagnosis not present

## 2021-09-12 DIAGNOSIS — D759 Disease of blood and blood-forming organs, unspecified: Secondary | ICD-10-CM | POA: Diagnosis not present

## 2021-09-12 DIAGNOSIS — K625 Hemorrhage of anus and rectum: Secondary | ICD-10-CM

## 2021-09-12 HISTORY — PX: ESOPHAGOGASTRODUODENOSCOPY (EGD) WITH PROPOFOL: SHX5813

## 2021-09-12 HISTORY — PX: COLONOSCOPY WITH PROPOFOL: SHX5780

## 2021-09-12 HISTORY — PX: POLYPECTOMY: SHX149

## 2021-09-12 HISTORY — PX: BIOPSY: SHX5522

## 2021-09-12 LAB — HM COLONOSCOPY

## 2021-09-12 SURGERY — COLONOSCOPY WITH PROPOFOL
Anesthesia: General

## 2021-09-12 MED ORDER — LIDOCAINE HCL (CARDIAC) PF 100 MG/5ML IV SOSY
PREFILLED_SYRINGE | INTRAVENOUS | Status: DC | PRN
Start: 2021-09-12 — End: 2021-09-12
  Administered 2021-09-12: 60 mg via INTRATRACHEAL

## 2021-09-12 MED ORDER — PROPOFOL 10 MG/ML IV BOLUS
INTRAVENOUS | Status: DC | PRN
  Administered 2021-09-12: 50 mg via INTRAVENOUS
  Administered 2021-09-12: 80 mg via INTRAVENOUS

## 2021-09-12 MED ORDER — LACTATED RINGERS IV SOLN: INTRAVENOUS | Status: DC | PRN

## 2021-09-12 MED ORDER — CIPROFLOXACIN HCL 500 MG PO TABS: 500.0000 mg | ORAL_TABLET | Freq: Two times a day (BID) | ORAL | 0 refills | Status: AC

## 2021-09-12 MED ORDER — LACTATED RINGERS IV SOLN
INTRAVENOUS | Status: DC
  Administered 2021-09-12: 1000 mL via INTRAVENOUS

## 2021-09-12 MED ORDER — PROPOFOL 500 MG/50ML IV EMUL
INTRAVENOUS | Status: DC | PRN
  Administered 2021-09-12: 150 ug/kg/min via INTRAVENOUS

## 2021-09-12 MED ORDER — METRONIDAZOLE 500 MG PO TABS
500.0000 mg | ORAL_TABLET | Freq: Two times a day (BID) | ORAL | 0 refills | Status: DC
Start: 2021-09-12 — End: 2022-02-07

## 2021-09-12 NOTE — Discharge Instructions (Signed)
Resume aspirin on 09/13/2021 ?Resume other medications as before ?Cipro 500 mg by mouth twice daily for 10 days ?Metronidazole 500 mg by mouth twice daily for 10 days(take it after breakfast and evening meal). ?Resume usual diet. ?No driving for 24 hours. ?Physician will call with biopsy results. ?

## 2021-09-12 NOTE — Anesthesia Preprocedure Evaluation (Signed)
Anesthesia Evaluation  ?Patient identified by MRN, date of birth, ID band ?Patient awake ? ? ? ?Reviewed: ?Allergy & Precautions, H&P , NPO status , Patient's Chart, lab work & pertinent test results, reviewed documented beta blocker date and time  ? ?Airway ?Mallampati: II ? ?TM Distance: >3 FB ?Neck ROM: full ? ? ? Dental ?no notable dental hx. ? ?  ?Pulmonary ?sleep apnea and Oxygen sleep apnea , COPD, former smoker,  ?  ?Pulmonary exam normal ?breath sounds clear to auscultation ? ? ? ? ? ? Cardiovascular ?Exercise Tolerance: Good ?negative cardio ROS ? ? ?Rhythm:regular Rate:Normal ? ? ?  ?Neuro/Psych ?PSYCHIATRIC DISORDERS Anxiety Depression  Neuromuscular disease   ? GI/Hepatic ?Neg liver ROS, GERD  Medicated,  ?Endo/Other  ?negative endocrine ROS ? Renal/GU ?negative Renal ROS  ?negative genitourinary ?  ?Musculoskeletal ? ? Abdominal ?  ?Peds ? Hematology ? ?(+) Blood dyscrasia, anemia ,   ?Anesthesia Other Findings ? ? Reproductive/Obstetrics ?negative OB ROS ? ?  ? ? ? ? ? ? ? ? ? ? ? ? ? ?  ?  ? ? ? ? ? ? ? ? ?Anesthesia Physical ?Anesthesia Plan ? ?ASA: 3 ? ?Anesthesia Plan: General  ? ?Post-op Pain Management:   ? ?Induction:  ? ?PONV Risk Score and Plan: Propofol infusion ? ?Airway Management Planned:  ? ?Additional Equipment:  ? ?Intra-op Plan:  ? ?Post-operative Plan:  ? ?Informed Consent: I have reviewed the patients History and Physical, chart, labs and discussed the procedure including the risks, benefits and alternatives for the proposed anesthesia with the patient or authorized representative who has indicated his/her understanding and acceptance.  ? ? ? ?Dental Advisory Given ? ?Plan Discussed with: CRNA ? ?Anesthesia Plan Comments:   ? ? ? ? ? ? ?Anesthesia Quick Evaluation ? ?

## 2021-09-12 NOTE — Op Note (Signed)
[facility] ?Patient Name: [name] ?Procedure Date: 09/12/2021 11:30 AM ?MRN: [id number] ?Date of Birth: [birth year] ?Attending MD: Lionel December , MD ?CSN: [id number] ?Age: 66 ?Admit Type: Outpatient ?Procedure:                Upper GI endoscopy ?Indications:              Esophageal dysphagia ?Providers:                Lionel December, MD, Crystal Page, Pandora Leiter,  ?                          Technician ?Referring MD:             Carylon Perches, MD ?Medicines:                Propofol per Anesthesia ?Complications:            No immediate complications. ?Estimated Blood Loss:     Estimated blood loss was minimal. ?Procedure:                Pre-Anesthesia Assessment: ?                          - Prior to the procedure, a History and Physical  ?                          was performed, and patient medications and  ?                          allergies were reviewed. The patient's tolerance of  ?                          previous anesthesia was also reviewed. The risks  ?                          and benefits of the procedure and the sedation  ?                          options and risks were discussed with the patient.  ?                          All questions were answered, and informed consent  ?                          was obtained. Prior Anticoagulants: The patient has  ?                          taken no previous anticoagulant or antiplatelet  ?                          agents except for aspirin. ASA Grade Assessment:  ?                          III - A patient with severe systemic disease. After  ?  reviewing the risks and benefits, the patient was  ?                          deemed in satisfactory condition to undergo the  ?                          procedure. ?                          After obtaining informed consent, the endoscope was  ?                          passed under direct vision. Throughout the  ?                          procedure, the patient's blood pressure, pulse,  and  ?                          oxygen saturations were monitored continuously. The  ?                          GIF-H190 ([id number]) scope was introduced through the  ?                          mouth, and advanced to the second part of duodenum.  ?                          The upper GI endoscopy was accomplished without  ?                          difficulty. The patient tolerated the procedure  ?                          well. ?Scope In: 11:51:37 AM ?Scope Out: 12:01:48 PM ?Total Procedure Duration: 0 hours 10 minutes 11 seconds  ?Findings: ?     The hypopharynx was normal. ?     The examined esophagus was normal. ?     The Z-line was irregular and was found 38 cm from the incisors. ?     A 2 cm hiatal hernia was present. ?     Nodular mucosa was found in the prepyloric region of the stomach.  ?     Biopsies were taken with a cold forceps for histology. The pathology  ?     specimen was placed into Bottle Number 1. ?     The exam of the stomach was otherwise normal. ?     The duodenal bulb and second portion of the duodenum were normal. ?     The scope was withdrawn. Dilation was attempted but could not pass past  ?     hypopharynx because patient was uncomfortable.Marland Kitchen ?Impression:               - Normal hypopharynx. ?                          - Normal esophagus. ?                          -  Z-line irregular, 38 cm from the incisors. ?                          - 2 cm hiatal hernia. ?                          - Nodular mucosa in the prepyloric region of the  ?                          stomach. Biopsied. ?                          - Normal duodenal bulb and second portion of the  ?                          duodenum. ?                          - Dilation attempted but not completed ?Moderate Sedation: ?     Per Anesthesia Care ?Recommendation:           - Patient has a contact number available for  ?                          emergencies. The signs and symptoms of potential  ?                          delayed complications  were discussed with the  ?                          patient. Return to normal activities tomorrow.  ?                          Written discharge instructions were provided to the  ?                          patient. ?                          - Resume previous diet today. ?                          - Continue present medications. ?                          - No aspirin, ibuprofen, naproxen, or other  ?                          non-steroidal anti-inflammatory drugs for 1 day. ?                          - Await pathology results. ?Procedure Code(s):        --- Professional --- ?                          [phone], Esophagogastroduodenoscopy, flexible,  ?  transoral; with biopsy, single or multiple ?                          43450, Dilation of esophagus, by unguided sound or  ?                          bougie, single or multiple passes ?Diagnosis Code(s):        --- Professional --- ?                          K22.8, Other specified diseases of esophagus ?                          K44.9, Diaphragmatic hernia without obstruction or  ?                          gangrene ?                          K31.89, Other diseases of stomach and duodenum ?                          R13.14, Dysphagia, pharyngoesophageal phase ?CPT copyright 2019 [employer] Association. All rights reserved. ?The codes documented in this report are preliminary and upon coder review may  ?be revised to meet current compliance requirements. ?Lionel December, MD ?Lionel December, MD ?09/12/2021 12:52:29 PM ?This report has been signed electronically. ?Number of Addenda: 0 ?

## 2021-09-12 NOTE — Transfer of Care (Signed)
Immediate Anesthesia Transfer of Care Note ? ?Patient: SENTA KANTOR ? ?Procedure(s) Performed: COLONOSCOPY WITH PROPOFOL ?ESOPHAGOGASTRODUODENOSCOPY (EGD) WITH PROPOFOL ?BIOPSY ?POLYPECTOMY INTESTINAL ? ?Patient Location: Short Stay ? ?Anesthesia Type:General ? ?Level of Consciousness: sedated ? ?Airway & Oxygen Therapy: Patient Spontanous Breathing and Patient connected to nasal cannula oxygen ? ?Post-op Assessment: Report given to RN and Post -op Vital signs reviewed and stable ? ?Post vital signs: Reviewed and stable ? ?Last Vitals:  ?Vitals Value Taken Time  ?BP    ?Temp    ?Pulse    ?Resp    ?SpO2    ? ? ?Last Pain:  ?Vitals:  ? 09/12/21 1147  ?TempSrc:   ?PainSc: 0-No pain  ?   ? ?Patients Stated Pain Goal: 5 (09/12/21 1122) ? ?Complications: No notable events documented. ?

## 2021-09-12 NOTE — H&P (Signed)
[name] is an 66 y.o. female.   ?Chief Complaint: Patient is here for esophagogastroduodenoscopy with esophageal dilation and colonoscopy. ?HPI: Patient is 66 year old Caucasian female with O2 dependent COPD who presents with intermittent solid food dysphagia.  She underwent esophageal dilation in December 2021 and it provided relief for several months.  She also has history of sigmoid diverticulitis and colonic stricture.  She has been treated for diverticulitis on multiple occasions but not recently.  She underwent CT for abdominal pain which revealed progressive thickening to sigmoid colon with luminal narrowing concerning for neoplasm.  Her last colonoscopy was in January 2019 which revealed sigmoid diverticulitis and 2 tubular adenomas were removed.  She return for sigmoidoscopy and May 2019 without additional findings. ?She has frequent stooling.  She passes small amount of soft mushy stool every time she urinates.  She has noted some mucus and scant amount of blood and tissue.  Her appetite is good.  She may have lost few pounds recently.  Aspirin is on hold. ?Family history is negative for colon cancer or IBD. ? ?Past Medical History:  ?Diagnosis Date  ? Allergy   ? Anxiety   ? Arthritis   ? COPD (chronic obstructive pulmonary disease) ([place])   ? Home oxygen at [facility]  ? Depression   ? Diverticulosis   ? History of cardiac catheterization   ? No significant CAD 2005  ? Hyperlipidemia   ? Hypoglycemia   ? IBS (irritable bowel syndrome)   ? Neuromuscular disorder ([place])   ? Osteoporosis   ? Pneumonia 2015  ? Shingles 2012  ? Sleep apnea   ? SVT (supraventricular tachycardia) ([place])   ? ? ?Past Surgical History:  ?Procedure Laterality Date  ? BACK SURGERY    ? BREAST SURGERY    ? CARDIAC CATHETERIZATION  2004  ? COLONOSCOPY  03/22/2011  ? Procedure: COLONOSCOPY;  Surgeon: Rogene Houston, MD;  [facility];  Service: Endoscopy;  Laterality: N/A;  ? COLONOSCOPY WITH PROPOFOL N/A 05/16/2017  ?  Procedure: COLONOSCOPY WITH PROPOFOL;  Surgeon: Rogene Houston, MD;  [facility];  Service: Endoscopy;  Laterality: N/A;  ? ESOPHAGEAL DILATION N/A 04/19/2020  ? Procedure: ESOPHAGEAL DILATION;  Surgeon: Rogene Houston, MD;  [facility];  Service: Endoscopy;  Laterality: N/A;  ? ESOPHAGOGASTRODUODENOSCOPY (EGD) WITH PROPOFOL N/A 04/19/2020  ? Procedure: ESOPHAGOGASTRODUODENOSCOPY (EGD) WITH PROPOFOL;  Surgeon: Rogene Houston, MD;  [facility];  Service: Endoscopy;  Laterality: N/A;  9:15  ? FLEXIBLE SIGMOIDOSCOPY N/A 09/26/2017  ? Procedure: FLEXIBLE SIGMOIDOSCOPY with Propofol ;  Surgeon: Rogene Houston, MD;  [facility];  Service: Endoscopy;  Laterality: N/A;  9:30  ? POLYPECTOMY  05/16/2017  ? Procedure: POLYPECTOMY;  Surgeon: Rogene Houston, MD;  [facility];  Service: Endoscopy;;  ? SPINAL FUSION    ? ? ?Family History  ?Problem Relation Age of Onset  ? Asthma Mother   ? Heart attack Mother   ? Diabetes Mother   ? Heart failure Mother   ? Colon cancer Neg Hx   ? ?Social History:  reports that she quit smoking about 6 years ago. Her smoking use included cigarettes. She has a 40.00 pack-year smoking history. She has been exposed to tobacco smoke. She has never used smokeless tobacco. She reports current alcohol use. She reports that she does not use drugs. ? ?Allergies:  ?Allergies  ?Allergen Reactions  ? Iohexol Anaphylaxis and Other (See Comments)  ?  Desc: IV DYE  ANAPHYLATIC SHOCK X2 ONCE AFTER PREMEDS ?  ? Neosporin [Neomycin-Bacitracin Zn-Polymyx] Itching  ? Adhesive [Tape] Itching and Other (See Comments)  ?  Break out  ? Ketek [Telithromycin] Other (See Comments)  ?  Loopy, eyes went different directions  ? Penicillins Other (See Comments)  ?  Unknown ?Has patient had a PCN reaction causing immediate rash, facial/tongue/throat swelling, SOB or lightheadedness with hypotension: Unknown ?Has patient had a PCN reaction causing severe rash  involving mucus membranes or skin necrosis: Unknown ?Has patient had a PCN reaction that required hospitalization: Unknown ?Has patient had a PCN reaction occurring within the last 10 years: No ?If all of the above answers are "NO", then may proceed with Cephalosporin use.  ? Chantix [Varenicline] Palpitations  ?  PALPITATIONS  ? ? ?Medications Prior to Admission  ?Medication Sig Dispense Refill  ? ALPRAZolam (XANAX) 0.25 MG tablet Take 0.25 mg by mouth 3 (three) times daily as needed for anxiety.     ? aspirin 81 MG tablet Take 1 tablet (81 mg total) by mouth daily. 100 tablet 3  ? atorvastatin (LIPITOR) 40 MG tablet TAKE 1 TABLET DAILY (Patient taking differently: Take 40 mg by mouth at bedtime.) 90 tablet 3  ? carboxymethylcellulose 1 % ophthalmic solution Place 2 drops into both eyes 3 (three) times daily as needed (for dry eyes).    ? cetirizine (ZYRTEC) 10 MG tablet Take 10 mg by mouth daily.    ? clonazePAM (KLONOPIN) 0.5 MG tablet Take 1 tablet (0.5 mg total) by mouth at bedtime. (Patient taking differently: Take 0.5 mg by mouth at bedtime as needed for anxiety.) 30 tablet 5  ? diclofenac Sodium (VOLTAREN) 1 % GEL Apply 2 g topically 2 (two) times daily as needed (pain).    ? dicyclomine (BENTYL) 10 MG capsule Take 1 capsule (10 mg total) by mouth 3 (three) times daily as needed for spasms. 90 capsule 2  ? diltiazem (CARDIZEM CD) 240 MG 24 hr capsule TAKE 1 CAPSULE DAILY 90 capsule 3  ? escitalopram (LEXAPRO) 20 MG tablet Take 20 mg by mouth daily.    ? fluticasone (FLONASE) 50 MCG/ACT nasal spray Place 1 spray into both nostrils daily as needed for allergies.    ? Fluticasone-Umeclidin-Vilant (TRELEGY ELLIPTA) 100-62.5-25 MCG/INH AEPB USE 1 INHALATION ORALLY    DAILY 180 each 3  ? HYDROcodone-acetaminophen (NORCO/VICODIN) 5-325 MG tablet Take 1 tablet by mouth every 6 (six) hours as needed for moderate pain.    ? lansoprazole (PREVACID) 15 MG capsule Take 1 capsule (15 mg total) by mouth 2 (two) times daily  before a meal. (Patient taking differently: Take 15 mg by mouth daily.) 180 capsule 3  ? montelukast (SINGULAIR) 10 MG tablet Take 1 tablet (10 mg total) by mouth at bedtime. 30 tablet 1  ? NON FORMULARY Pt uses a bipap with oxygen when laying down    ? oxybutynin (DITROPAN) 5 MG tablet Take 5 mg by mouth daily.     ? OXYGEN Inhale 4 L into the lungs continuous.    ? roflumilast (DALIRESP) 500 MCG TABS tablet Take 500 mcg by mouth daily.    ? sodium chloride (OCEAN) 0.65 % SOLN nasal spray Place 2-3 sprays into both nostrils as needed for congestion.    ? traMADol (ULTRAM) 50 MG tablet Take 50 mg by mouth every 6 (six) hours as needed for moderate pain.     ? traZODone (DESYREL) 50 MG tablet Take 50 mg by mouth  at bedtime as needed for sleep.    ? diltiazem (CARDIZEM) 30 MG tablet Take 1-2 tablets every 6 hours as needed for persistent tachycardai 90 tablet 3  ? ? ?No results found for this or any previous visit (from the past 48 hour(s)). ?No results found. ? ?Review of Systems ? ?Blood pressure 139/75, pulse 90, temperature 98 ?F (36.7 ?C), temperature source Oral, resp. rate (!) 22, height '5\' 1"'$  (1.549 m), weight 60.9 kg, SpO2 99 %. ?Physical Exam ?HENT:  ?   Mouth/Throat:  ?   Mouth: Mucous membranes are moist.  ?   Pharynx: Oropharynx is clear.  ?Eyes:  ?   General: No scleral icterus. ?   Conjunctiva/sclera: Conjunctivae normal.  ?Cardiovascular:  ?   Rate and Rhythm: Normal rate and regular rhythm.  ?   Heart sounds: Normal heart sounds. No murmur heard. ?Pulmonary:  ?   Effort: Pulmonary effort is normal.  ?   Breath sounds: Normal breath sounds.  ?Abdominal:  ?   General: There is no distension.  ?   Palpations: Abdomen is soft. There is no mass.  ?   Tenderness: There is no abdominal tenderness.  ?   Comments: Small umbilical hernia  ?Musculoskeletal:  ?   Cervical back: Neck supple.  ?Lymphadenopathy:  ?   Cervical: No cervical adenopathy.  ?Skin: ?   General: Skin is warm and dry.  ?Neurological:  ?    Mental Status: She is alert.  ?  ? ?Assessment/Plan ? ?Esophageal dysphagia. ?Abnormal CT revealing thickening to sigmoid colon wall. ?Esophagogastroduodenoscopy with esophageal dilation and diagnostic colonos

## 2021-09-12 NOTE — Op Note (Signed)
[facility] ?Patient Name: [name] ?Procedure Date: 09/12/2021 11:29 AM ?MRN: [id number] ?Date of Birth: [birth year] ?Attending MD: Lionel December , MD ?CSN: [id number] ?Age: 66 ?Admit Type: Outpatient ?Procedure:                Colonoscopy ?Indications:              Abnormal CT of the GI tract ?Providers:                Lionel December, MD, Crystal Page, Pandora Leiter,  ?                          Technician ?Referring MD:             Carylon Perches, MD ?Medicines:                Propofol per Anesthesia ?Complications:            No immediate complications. ?Estimated Blood Loss:     Estimated blood loss was minimal. ?Procedure:                Pre-Anesthesia Assessment: ?                          - Prior to the procedure, a History and Physical  ?                          was performed, and patient medications and  ?                          allergies were reviewed. The patient's tolerance of  ?                          previous anesthesia was also reviewed. The risks  ?                          and benefits of the procedure and the sedation  ?                          options and risks were discussed with the patient.  ?                          All questions were answered, and informed consent  ?                          was obtained. Prior Anticoagulants: The patient has  ?                          taken no previous anticoagulant or antiplatelet  ?                          agents except for aspirin. ASA Grade Assessment:  ?                          III - A patient with severe systemic disease. After  ?  reviewing the risks and benefits, the patient was  ?                          deemed in satisfactory condition to undergo the  ?                          procedure. ?                          After obtaining informed consent, the colonoscope  ?                          was passed under direct vision. Throughout the  ?                          procedure, the patient's blood pressure, pulse,  and  ?                          oxygen saturations were monitored continuously. The  ?                          PCF-HQ190L ([id number]) scope was introduced through  ?                          the anus and advanced to the the cecum, identified  ?                          by appendiceal orifice and ileocecal valve. The  ?                          colonoscopy was technically difficult and complex  ?                          due to {skip} sigmoid colon stricture. Successful  ?                          completion of the procedure was aided by  ?                          withdrawing the scope and replacing with the  ?                          'babyscope'. The patient tolerated the procedure  ?                          well. The quality of the bowel preparation was  ?                          fair. The ileocecal valve, appendiceal orifice, and  ?                          rectum were photographed. ?Scope In: 12:06:48 PM ?Scope Out: 12:34:07 PM ?Scope Withdrawal Time: 0 hours 7 minutes 5 seconds  ?Total Procedure Duration: 0 hours 27 minutes 19  seconds  ?Findings: ?     Skin tags were found on perianal exam. ?     Three polyps were found in the transverse colon, hepatic flexure and  ?     ascending colon. The polyps were small in size. These polyps were  ?     removed with a cold snare. Resection was complete, but the polyp tissue  ?     was only partially retrieved. The pathology specimen was placed into  ?     Bottle Number 2. ?     Scattered diverticula were found in the colon. ?     A benign-appearing, intrinsic moderate stenosis was found in the distal  ?     sigmoid colon and traversed after downsizing the scope. ?     An area of moderately congested mucosa with mucopurulent material noted  ?     in the vicinity of diverticulum at mid sigmoid colon. Proximal to the  ?     stricture. ?     External hemorrhoids were found during retroflexion. The hemorrhoids  ?     were small. ?Impression:               - Preparation of  the colon was fair. ?                          - Perianal skin tags found on perianal exam. ?                          - Three small polyps in the transverse colon, at  ?                          the hepatic flexure and in the ascending colon,  ?                          removed with a cold snare. Complete resection.  ?                          Partial retrieval. ?                          - Sigmoid diverticulosis and sigmoid diverticulitis. ?                          - Stricture in the distal sigmoid colon. Stricture  ?                          appears to be benign secondary to diverticulitis.Marland Kitchen ?                          - External hemorrhoids. ?Moderate Sedation: ?     Per Anesthesia Care ?Recommendation:           - Resume previous diet today. ?                          - Continue present medications. ?                          - No aspirin, ibuprofen, naproxen, or  other  ?                          non-steroidal anti-inflammatory drugs for 1 day. ?                          - Cipro 500 mg by mouth twice daily for 10 days ?                          - Metronidazole 500 mg by mouth twice daily for 10  ?                          days ?                          - Await pathology results. ?                          - Surgical consultation to be arranged. ?                          - Repeat colonoscopy is recommended. The  ?                          colonoscopy date will be determined after pathology  ?                          results from today's exam become available for  ?                          review. ?Procedure Code(s):        --- Professional --- ?                          [phone], Colonoscopy, flexible; with removal of  ?                          tumor(s), polyp(s), or other lesion(s) by snare  ?                          technique ?Diagnosis Code(s):        --- Professional --- ?                          K64.4, Residual hemorrhoidal skin tags ?                          K63.5, Polyp of colon ?                           K56.699, Other intestinal obstruction unspecified  ?                          as to partial versus complete obstruction ?                          K63.89, Other specified diseases of  intestine ?                          K57.30, Diverticulosis of large intestine without  ?                          perforation or abscess without bleeding ?                          R93.3, Abnormal findings on diagnostic imaging of  ?                          other parts of digestive tract ?CPT copyright 2019 [employer]. All rights reserved. ?The codes documented in this report are preliminary and upon coder review may  ?be revised to meet current compliance requirements. ?Lionel December, MD ?Lionel December, MD ?09/12/2021 1:01:10 PM ?This report has been signed electronically. ?Number of Addenda: 0 ?

## 2021-09-13 ENCOUNTER — Encounter (INDEPENDENT_AMBULATORY_CARE_PROVIDER_SITE_OTHER): Payer: Self-pay | Admitting: *Deleted

## 2021-09-13 NOTE — Anesthesia Postprocedure Evaluation (Signed)
Anesthesia Post Note ? ?Patient: [name] ? ?Procedure(s) Performed: COLONOSCOPY WITH PROPOFOL ?ESOPHAGOGASTRODUODENOSCOPY (EGD) WITH PROPOFOL ?BIOPSY ?POLYPECTOMY INTESTINAL ? ?Patient location during evaluation: Phase II ?Anesthesia Type: General ?Level of consciousness: awake ?Pain management: pain level controlled ?Vital Signs Assessment: post-procedure vital signs reviewed and stable ?Respiratory status: spontaneous breathing and respiratory function stable ?Cardiovascular status: blood pressure returned to baseline and stable ?Postop Assessment: no headache and no apparent nausea or vomiting ?Anesthetic complications: no ?Comments: Late entry ? ? ?No notable events documented. ? ? ?Last Vitals:  ?Vitals:  ? 09/12/21 1122 09/12/21 1248  ?BP: 139/75 122/65  ?Pulse: 90 80  ?Resp: (!) 22 20  ?Temp: 36.7 ?C 36.5 ?C  ?SpO2: 99% 99%  ?  ?Last Pain:  ?Vitals:  ? 09/12/21 1248  ?TempSrc: Oral  ?PainSc: 0-No pain  ? ? ?  ?  ?  ?  ?  ?  ? ?Louann Sjogren ? ? ? ? ?

## 2021-09-14 LAB — SURGICAL PATHOLOGY

## 2021-09-19 ENCOUNTER — Encounter (HOSPITAL_COMMUNITY): Payer: Self-pay | Admitting: Internal Medicine

## 2021-10-03 DIAGNOSIS — J9611 Chronic respiratory failure with hypoxia: Secondary | ICD-10-CM | POA: Diagnosis not present

## 2021-10-03 DIAGNOSIS — J449 Chronic obstructive pulmonary disease, unspecified: Secondary | ICD-10-CM | POA: Diagnosis not present

## 2021-10-03 DIAGNOSIS — I2510 Atherosclerotic heart disease of native coronary artery without angina pectoris: Secondary | ICD-10-CM | POA: Diagnosis not present

## 2021-10-23 ENCOUNTER — Ambulatory Visit: Payer: Self-pay | Admitting: Surgery

## 2021-10-23 ENCOUNTER — Encounter: Payer: Self-pay | Admitting: Surgery

## 2021-10-23 DIAGNOSIS — K56699 Other intestinal obstruction unspecified as to partial versus complete obstruction: Secondary | ICD-10-CM | POA: Diagnosis not present

## 2021-10-23 DIAGNOSIS — K5732 Diverticulitis of large intestine without perforation or abscess without bleeding: Secondary | ICD-10-CM | POA: Diagnosis present

## 2021-10-23 DIAGNOSIS — Z9981 Dependence on supplemental oxygen: Secondary | ICD-10-CM | POA: Diagnosis not present

## 2021-10-23 DIAGNOSIS — J439 Emphysema, unspecified: Secondary | ICD-10-CM | POA: Diagnosis not present

## 2021-10-23 DIAGNOSIS — R739 Hyperglycemia, unspecified: Secondary | ICD-10-CM

## 2021-10-25 ENCOUNTER — Telehealth: Payer: Self-pay | Admitting: Pulmonary Disease

## 2021-10-26 ENCOUNTER — Telehealth: Payer: Self-pay | Admitting: *Deleted

## 2021-10-26 NOTE — Telephone Encounter (Signed)
   Name: [name]  DOB: Mar 17, 1956  MRN: [id number]  Primary Cardiologist: Nona Dell, MD  Chart reviewed as part of pre-operative protocol coverage. Because of [name] past medical history and time since last visit, she will require a follow-up in-office visit in order to better assess preoperative cardiovascular risk.  Pre-op covering staff: - Please schedule appointment and call patient to inform them. If patient already had an upcoming appointment within acceptable timeframe, please add "pre-op clearance" to the appointment notes so provider is aware. - Please contact requesting surgeon's office via preferred method (i.e, phone, fax) to inform them of need for appointment prior to surgery.    [place], [place]  10/26/2021, 11:26 PM

## 2021-11-01 NOTE — Telephone Encounter (Signed)
Pt has been scheduled to see Dr. Domenic Polite 11/27/21, clearance will be addressed at that time.  Will route back to the requesting surgeon's office to make them aware.

## 2021-11-02 NOTE — Telephone Encounter (Signed)
   Pt is calling to f/u clearance. She said, she doesn't want to wait until 07/25. If she can get clearance sooner so she can schedule her procedure sooner

## 2021-11-02 NOTE — Telephone Encounter (Signed)
S/w the pt and she is agreeable to sooner appt in the [facility] office 11/12/21 @ 1:55 pm with Sande Rives, PAC. I will cancel the 11/27/21 appt with Dr. Domenic Polite, per pt.   I will update the surgeon office appt moved up sooner for cardiology.

## 2021-11-04 NOTE — Progress Notes (Addendum)
Cardiology Office Note:    Date:  11/12/2021   ID:  [name], DOB 08-May-1955, MRN [id number]  PCP:  Carylon Perches, MD  Cardiologist:  Nona Dell, MD  Electrophysiologist:  None   Referring MD: Carylon Perches, MD   Chief Complaint: pre-op evaluation  History of Present Illness:    [name] is a 66 y.o. female with a history of mild non-obstructive CAD noted on remote cardiac catheterization in 2005 and normal Myoview in 2014, paroxysmal SVT, severe COPD with chronic hypoxic respiratory failure on O2 at home, hyperlipidemia, obstructive sleep apnea, and diverticulosis who is followed by Dr. Diona Browner and presents today for pre-op evaluation for upcoming robotic colectomy.  Patient is primarily followed by Dr. Diona Browner for paroxysmal SVT. Remote cardiac catheterization in 2005 showed mild non-obstructive CAD with only 50% stenosis of of septal perforator vessel and 30% narrowing of the proximal RCA. Myoview in 08/2012 was normal. Monitor in 06/2015 showed underlying sinus rhythm with rare PACs/PVCs but no sustained arrhythmias or pauses. Most recent Echo in 10/2020 showed LVEF of 55-60% with no regional wall motion abnormalities, grade 1 diastolic dysfunction, and no significant valvular disease.  Patient was last seen by Dr. Diona Browner in 05/2021 at which time patient reported no change in her chronic dyspnea on exertion (NYHA class III) due to her COPD. However, she was doing well from a cardiac standpoint. Palpitations were well controlled on Cardizem.  Patient presents today for pre-op evaluation for upcoming robotic colectomy for diverticulitis.  Here with caregiver. Patient reports no changes since last visit with Dr. Diona Browner. She has chronic dyspnea due to her severe COPD but this is stable. She is on 4-7 L of O2 at home all the day. She also requires BiPAP at night. She states she has to use her BiPAP when laying flat or she cannot breathe but this is not new. No PND. She has occasional  mild lower extremity edema but this is stable. She also describe occasional relatively brief episodes chest pressure that occurs randomly but she states she has a hard time differentiating this between her GI symptoms and thinks it may be due to that. She states this is not new for her. She notes occasional "extra beat" but no significant palpitations. No lightheadedness/dizziness or syncope.   Past Medical History:  Diagnosis Date   Allergy    Anxiety    Arthritis    COPD (chronic obstructive pulmonary disease) (HCC)    Home oxygen at [facility]   Depression    Diverticulosis    History of cardiac catheterization    No significant CAD 2005   Hyperlipidemia    Hypoglycemia    IBS (irritable bowel syndrome)    Neuromuscular disorder (HCC)    Osteoporosis    Paroxysmal SVT (supraventricular tachycardia) (HCC)    Pneumonia 2015   Shingles 2012   Sleep apnea     Past Surgical History:  Procedure Laterality Date   BACK SURGERY     BIOPSY  09/12/2021   Procedure: BIOPSY;  Surgeon: Malissa Hippo, MD;  [facility];  Service: Endoscopy;;   BREAST SURGERY     CARDIAC CATHETERIZATION  2004   COLONOSCOPY  03/22/2011   Procedure: COLONOSCOPY;  Surgeon: Malissa Hippo, MD;  [facility];  Service: Endoscopy;  Laterality: N/A;   COLONOSCOPY WITH PROPOFOL N/A 05/16/2017   Procedure: COLONOSCOPY WITH PROPOFOL;  Surgeon: Malissa Hippo, MD;  [facility];  Service: Endoscopy;  Laterality:  N/A;   COLONOSCOPY WITH PROPOFOL N/A 09/12/2021   Procedure: COLONOSCOPY WITH PROPOFOL;  Surgeon: Malissa Hippo, MD;  [facility];  Service: Endoscopy;  Laterality: N/A;  1230 ASA 2   ESOPHAGEAL DILATION N/A 04/19/2020   Procedure: ESOPHAGEAL DILATION;  Surgeon: Malissa Hippo, MD;  [facility];  Service: Endoscopy;  Laterality: N/A;   ESOPHAGOGASTRODUODENOSCOPY (EGD) WITH PROPOFOL N/A 04/19/2020   Procedure: ESOPHAGOGASTRODUODENOSCOPY (EGD) WITH  PROPOFOL;  Surgeon: Malissa Hippo, MD;  [facility];  Service: Endoscopy;  Laterality: N/A;  9:15   ESOPHAGOGASTRODUODENOSCOPY (EGD) WITH PROPOFOL N/A 09/12/2021   Procedure: ESOPHAGOGASTRODUODENOSCOPY (EGD) WITH PROPOFOL;  Surgeon: Malissa Hippo, MD;  [facility];  Service: Endoscopy;  Laterality: N/A;   FLEXIBLE SIGMOIDOSCOPY N/A 09/26/2017   Procedure: FLEXIBLE SIGMOIDOSCOPY with Propofol ;  Surgeon: Malissa Hippo, MD;  [facility];  Service: Endoscopy;  Laterality: N/A;  9:30   POLYPECTOMY  05/16/2017   Procedure: POLYPECTOMY;  Surgeon: Malissa Hippo, MD;  [facility];  Service: Endoscopy;;   POLYPECTOMY  09/12/2021   Procedure: POLYPECTOMY INTESTINAL;  Surgeon: Malissa Hippo, MD;  [facility];  Service: Endoscopy;;   SPINAL FUSION      Current Medications: Current Meds  Medication Sig   ALPRAZolam (XANAX) 0.25 MG tablet Take 0.25 mg by mouth 3 (three) times daily as needed for anxiety.    aspirin 81 MG tablet Take 1 tablet (81 mg total) by mouth daily.   atorvastatin (LIPITOR) 40 MG tablet TAKE 1 TABLET DAILY (Patient taking differently: Take 40 mg by mouth at bedtime.)   carboxymethylcellulose 1 % ophthalmic solution Place 2 drops into both eyes 3 (three) times daily as needed (for dry eyes).   cetirizine (ZYRTEC) 10 MG tablet Take 10 mg by mouth daily.   clonazePAM (KLONOPIN) 0.5 MG tablet Take 1 tablet (0.5 mg total) by mouth at bedtime. (Patient taking differently: Take 0.5 mg by mouth at bedtime as needed for anxiety.)   diclofenac Sodium (VOLTAREN) 1 % GEL Apply 2 g topically 2 (two) times daily as needed (pain).   dicyclomine (BENTYL) 10 MG capsule Take 1 capsule (10 mg total) by mouth 3 (three) times daily as needed for spasms.   diltiazem (CARDIZEM CD) 240 MG 24 hr capsule TAKE 1 CAPSULE DAILY   diltiazem (CARDIZEM) 30 MG tablet Take 1-2 tablets every 6 hours as needed for persistent tachycardai    escitalopram (LEXAPRO) 20 MG tablet Take 20 mg by mouth daily.   fluticasone (FLONASE) 50 MCG/ACT nasal spray Place 1 spray into both nostrils daily as needed for allergies.   Fluticasone-Umeclidin-Vilant (TRELEGY ELLIPTA) 100-62.5-25 MCG/INH AEPB USE 1 INHALATION ORALLY    DAILY   HYDROcodone-acetaminophen (NORCO/VICODIN) 5-325 MG tablet Take 1 tablet by mouth every 6 (six) hours as needed for moderate pain.   lansoprazole (PREVACID) 15 MG capsule Take 1 capsule (15 mg total) by mouth 2 (two) times daily before a meal. (Patient taking differently: Take 15 mg by mouth daily.)   metroNIDAZOLE (FLAGYL) 500 MG tablet Take 1 tablet (500 mg total) by mouth 2 (two) times daily.   montelukast (SINGULAIR) 10 MG tablet Take 1 tablet (10 mg total) by mouth at bedtime.   NON FORMULARY Pt uses a bipap with oxygen when laying down   oxybutynin (DITROPAN) 5 MG tablet Take 5 mg by mouth daily.    OXYGEN Inhale 4 L into the lungs continuous.   roflumilast (  DALIRESP) 500 MCG TABS tablet Take 500 mcg by mouth daily.   sodium chloride (OCEAN) 0.65 % SOLN nasal spray Place 2-3 sprays into both nostrils as needed for congestion.   traMADol (ULTRAM) 50 MG tablet Take 50 mg by mouth every 6 (six) hours as needed for moderate pain.    traZODone (DESYREL) 50 MG tablet Take 50 mg by mouth at bedtime as needed for sleep.     Allergies:   Iohexol, Neosporin [neomycin-bacitracin zn-polymyx], Adhesive [tape], Ketek [telithromycin], Penicillins, Silicone, and Chantix [varenicline]   Social History   Socioeconomic History   Marital status: Divorced    Spouse name: Not on file   Number of children: Not on file   Years of education: Not on file   Highest education level: Not on file  Occupational History   Not on file  Tobacco Use   Smoking status: Former    Packs/day: 1.00    Years: 40.00    Total pack years: 40.00    Types: Cigarettes    Quit date: 05/07/2015    Years since quitting: 6.5    Passive exposure: Past    Smokeless tobacco: Never  Vaping Use   Vaping Use: Former  Substance and Sexual Activity   Alcohol use: Yes    Comment: 1 -2 glasses of wine daily   Drug use: No   Sexual activity: Never  Other Topics Concern   Not on file  Social History Narrative   Daughter is MD Judeth Cornfield Janne Napoleon, radiologist in [place])   Social Determinants of Health   Financial Resource Strain: Not on file  Food Insecurity: Not on file  Transportation Needs: Not on file  Physical Activity: Not on file  Stress: Not on file  Social Connections: Not on file     Family History: The patient's family history includes Asthma in her mother; Diabetes in her mother; Heart attack in her mother; Heart failure in her mother. There is no history of Colon cancer.  ROS:   Please see the history of present illness.     EKGs/Labs/Other Studies Reviewed:    The following studies were reviewed today:  Cardiac Catheterization 07/05/2003: Findings: The LAD is a large vessel that coursed to the apex and gives rise to one diagonal branch and one large septal perforator.  The LAD has no significant disease. First diagonal is a medium size vessel which bifurcates distally with no significant disease.  There is a medium size septal perforator with a 50% ostial lesion.   Left circumflex is a large vessel that coursed in the AV groove and gives rise to three obtuse marginal branches.  The AV groove circumflex has no significant disease.   The first OM is a large vessel which bifurcates distally with no significant disease.  Second and third OMs are small vessels with no significant disease.   The right coronary artery is a large vessel.  It is dominant.  It gives rise to both PDA as well as posterior lateral branch.  There is mild 30% proximal RCA narrowing.  The PDA and posterior lateral branch have no significant disease.   Left ventriculogram reveals a preserved EF at 60%.   Abdominal aortogram reveals  minimal distal aortic plaque with widely patent renal arteries.  Conclusions:  1. No significant coronary artery disease.  2. Normal left ventricular systolic function.  3. No evidence of renal artery stenosis with minimal plaque in the distal     descending aorta. _______________  Myoview 08/19/2012:  Impression: Exercise Capacity:  Dobutamine study with no exercise. BP Response:  Normal blood pressure response. Clinical Symptoms:  No significant symptoms noted. ECG Impression:  No significant ST segment change suggestive of ischemia. Comparison with Prior Nuclear Study: No significant change from previous study   Overall Impression:  Normal stress nuclear study.   LV Wall Motion:  NL LV Function; NL Wall Motion _______________  Monitor 06/2015: Heart rate ranged from 70 bpm up to 137 bpm. Sinus rhythm and sinus tachycardia were present. Rare PACs and PVCs were noted. There were no sustained arrhythmias. No pauses. _______________  Echocardiogram 10/31/2020: Impressions:  1. Left ventricular ejection fraction, by estimation, is 55 to 60%. The  left ventricle has normal function. The left ventricle has no regional  wall motion abnormalities. Left ventricular diastolic parameters are  consistent with Grade I diastolic  dysfunction (impaired relaxation).   2. Right ventricular systolic function is normal. The right ventricular  size is normal.   3. The mitral valve is normal in structure. No evidence of mitral valve  regurgitation. No evidence of mitral stenosis.   4. The aortic valve has an indeterminant number of cusps. Aortic valve  regurgitation is not visualized. No aortic stenosis is present.   5. The inferior vena cava is normal in size with greater than 50%  respiratory variability, suggesting right atrial pressure of 3 mmHg.    EKG:  EKG ordered today. EKG personally reviewed and demonstrates normal sinus rhythm, rate 73 bpm, with non-specific T/T changes. Normal axis.  Normal PR and QRS intervals. QTc 412 ms.  Recent Labs: No results found for requested labs within last 365 days.  Recent Lipid Panel No results found for: "CHOL", "TRIG", "HDL", "CHOLHDL", "VLDL", "LDLCALC", "LDLDIRECT"  Physical Exam:    Vital Signs: BP 120/66 (BP Location: Left Arm)   Pulse 73   Ht 5\' 1"  (1.549 m)   Wt 132 lb 9.6 oz (60.1 kg)   SpO2 99%   BMI 25.05 kg/m     Wt Readings from Last 3 Encounters:  11/12/21 132 lb 9.6 oz (60.1 kg)  09/12/21 134 lb 4.2 oz (60.9 kg)  08/27/21 134 lb 3.2 oz (60.9 kg)     General: 66 y.o. Caucasian female in no acute distress. HEENT: Normocephalic and atraumatic. Sclera clear.  Neck: Supple. No JVD. Heart: RRR. Distinct S1 and S2. No murmurs, gallops, or rubs.  Lungs: On 4L of supplemental O2 via nasal cannula. No increased work of breathing. Clear to ausculation bilaterally. No wheezes, rhonchi, or rales.  Abdomen: Soft, non-distended, and non-tender to palpation.  Extremities: No lower extremity edema.    Skin: Warm and dry. Neuro: Alert and oriented x3. No focal deficits. Psych: Normal affect. Responds appropriately.  Assessment:    1. Pre-op evaluation   2. Chest pain of uncertain etiology   3. Non-obstructive CAD   4. PSVT (paroxysmal supraventricular tachycardia) (HCC)   5. Hyperlipidemia, unspecified hyperlipidemia type   6. Chronic dyspnea   7. Chronic obstructive pulmonary disease, unspecified COPD type (HCC)     Plan:    Pre-Op Evaluation Patient has upcoming robotic colectomy planned for diverticulitis. Patient as chronic dyspnea and activity is very limited due to her severe COPD. She is euvolemic on exam. She also describes some vague chest pain. Therefore, will obtained [facility] for further risk stratification. If this is low risk, she will be at acceptable risk for surgery. She can hold Aspirin for 5-7 days if needed prior to surgery but would  restart this as soon as safely possible postoperatively. Of  note, she is at elevated risk primarily due to her severe COPD. Per patient, it is very important that she be allowed to wear BiPAP after surgery. She states she has to wear this when laying flat or she cannot breathe (this is not new for her).   Shared Decision Making/Informed Consent{ The risks [chest pain, shortness of breath, cardiac arrhythmias, dizziness, blood pressure fluctuations, myocardial infarction, stroke/transient ischemic attack, nausea, vomiting, allergic reaction, radiation exposure, metallic taste sensation and life-threatening complications (estimated to be 1 in 10,000)], benefits (risk stratification, diagnosing coronary artery disease, treatment guidance) and alternatives of a nuclear stress test were discussed in detail with Ms. Medlen and she agrees to proceed.   Chest Pain Non-Obstructive CAD Mild non-obstructive CAD was noted on remote cardiac cath in 2005.  - Patient reports vague chest pain that occurs randomly. She states she is not able to differentiate this between her GI issues. - Continue aspirin and statin. - Low suspicion that this is cardiac in nature. However, given need for colectomy, will get [facility] as above.  Paroxysmal SVT Stable. - Continue Cardizem CD 240mg  daily. She also has short acting Cardizem 30mg  daily that she takes as needed for persistent tachycardia.  Hyperlipidemia - Continue Lipitor 40mg  daily. - Labs followed by PCP.  Chronic Dyspnea Severe COPD on Home O2 Patient has chronic dyspnea secondary to COPD. She is on 4-7L of O2 at home 24/7. - Stable. No wheezing on exam. - Followed by [name].  Disposition: Follow up in 6 months with Dr. Diona Browner.   ADDENDUM 11/16/2021: Patient's Myoview came back low risk with no evidence of ischemia. Therefore, OK to proceed with robotic colectomy. She is at elevated risk mainly due to her severe COPD. Patient states Pulmonology has already given her the OK to proceed with surgery as well.  Would recommended continuing Cardizem perioperatively given history of SVT. She can hold Aspirin for 5-7 days if needed prior to surgery but would restart this as soon as safely possible postoperatively. As mentioned above, patient wanted surgery team to know that it is very important that she be allowed to wear BIPAP after surgery.  I will route this recommendation to the requesting party via Epic fax function.   Medication Adjustments/Labs and Tests Ordered: Current medicines are reviewed at length with the patient today.  Concerns regarding medicines are outlined above.  Orders Placed This Encounter  Procedures   MYOCARDIAL PERFUSION IMAGING   EKG 12-Lead   No orders of the defined types were placed in this encounter.   Patient Instructions   Testing/Procedures:  Your physician has requested that you have a lexiscan myoview. For further information please visit [email or web address] Please follow instruction sheet, as given. [address]   Follow-Up: At [facility], you and your health needs are our priority.  As part of our continuing mission to provide you with exceptional heart care, we have created designated Provider Care Teams.  These Care Teams include your primary Cardiologist (physician) and Advanced Practice Providers (APPs -  Physician Assistants and Nurse Practitioners) who all work together to provide you with the care you need, when you need it.  We recommend signing up for the patient portal called "MyChart".  Sign up information is provided on this After Visit Summary.  MyChart is used to connect with patients for Virtual Visits (Telemedicine).  Patients are able to view lab/test results, encounter notes, upcoming appointments, etc.  Non-urgent  messages can be sent to your provider as well.   To learn more about what you can do with MyChart, go to [email or web address].    Your next appointment:   6 month(s)  The format for your next appointment:   In  Person  Provider:   Nona Dell, MD      Important Information About Sugar        Signed, Corrin Parker, PA-C  11/12/2021 5:28 PM    [facility]

## 2021-11-05 ENCOUNTER — Telehealth: Payer: Self-pay | Admitting: Pulmonary Disease

## 2021-11-05 NOTE — Telephone Encounter (Signed)
Called and spoke with patient. Patient stated that Dr. Johney Maine already has surgical clearance from [place] and wanted to know if her appointment with Joellen Jersey was needed for surgical clearance. Advised patient that if the doctor already had surgical clearance she could cancel her appointment with Joellen Jersey because they have already cleared her for surgery.   Patient stated she wanted the appointment canceled. Advised patient I would cancel her appointment.   Nothing further needed.

## 2021-11-08 ENCOUNTER — Ambulatory Visit: Payer: Medicare Other | Admitting: Nurse Practitioner

## 2021-11-11 ENCOUNTER — Encounter: Payer: Self-pay | Admitting: Student

## 2021-11-12 ENCOUNTER — Ambulatory Visit (INDEPENDENT_AMBULATORY_CARE_PROVIDER_SITE_OTHER): Payer: Medicare Other | Admitting: Student

## 2021-11-12 ENCOUNTER — Encounter: Payer: Self-pay | Admitting: Student

## 2021-11-12 VITALS — BP 120/66 | HR 73 | Ht 61.0 in | Wt 132.6 lb

## 2021-11-12 DIAGNOSIS — E785 Hyperlipidemia, unspecified: Secondary | ICD-10-CM | POA: Diagnosis not present

## 2021-11-12 DIAGNOSIS — R079 Chest pain, unspecified: Secondary | ICD-10-CM

## 2021-11-12 DIAGNOSIS — Z01818 Encounter for other preprocedural examination: Secondary | ICD-10-CM

## 2021-11-12 DIAGNOSIS — I471 Supraventricular tachycardia: Secondary | ICD-10-CM

## 2021-11-12 DIAGNOSIS — R0609 Other forms of dyspnea: Secondary | ICD-10-CM | POA: Diagnosis not present

## 2021-11-12 DIAGNOSIS — J449 Chronic obstructive pulmonary disease, unspecified: Secondary | ICD-10-CM

## 2021-11-12 DIAGNOSIS — I2510 Atherosclerotic heart disease of native coronary artery without angina pectoris: Secondary | ICD-10-CM | POA: Diagnosis not present

## 2021-11-12 NOTE — Patient Instructions (Signed)
  Testing/Procedures:  Your physician has requested that you have a lexiscan myoview. For further information please visit [email or web address]. Please follow instruction sheet, as given. [place]   Follow-Up: At [facility], you and your health needs are our priority.  As part of our continuing mission to provide you with exceptional heart care, we have created designated Provider Care Teams.  These Care Teams include your primary Cardiologist (physician) and Advanced Practice Providers (APPs -  Physician Assistants and Nurse Practitioners) who all work together to provide you with the care you need, when you need it.  We recommend signing up for the patient portal called "MyChart".  Sign up information is provided on this After Visit Summary.  MyChart is used to connect with patients for Virtual Visits (Telemedicine).  Patients are able to view lab/test results, encounter notes, upcoming appointments, etc.  Non-urgent messages can be sent to your provider as well.   To learn more about what you can do with MyChart, go to [email or web address].    Your next appointment:   6 month(s)  The format for your next appointment:   In Person  Provider:   Rozann Lesches, MD      Important Information About Sugar

## 2021-11-14 ENCOUNTER — Other Ambulatory Visit (HOSPITAL_COMMUNITY): Payer: Self-pay | Admitting: Student

## 2021-11-14 DIAGNOSIS — Z01818 Encounter for other preprocedural examination: Secondary | ICD-10-CM

## 2021-11-14 DIAGNOSIS — Z8679 Personal history of other diseases of the circulatory system: Secondary | ICD-10-CM

## 2021-11-15 ENCOUNTER — Ambulatory Visit (HOSPITAL_COMMUNITY)
Admission: RE | Admit: 2021-11-15 | Discharge: 2021-11-15 | Disposition: A | Discharge status: Home / Self Care | Payer: Medicare Other | Source: Ambulatory Visit | Attending: Student | Admitting: Student

## 2021-11-15 DIAGNOSIS — R079 Chest pain, unspecified: Secondary | ICD-10-CM | POA: Diagnosis not present

## 2021-11-15 DIAGNOSIS — R0600 Dyspnea, unspecified: Secondary | ICD-10-CM | POA: Diagnosis not present

## 2021-11-15 DIAGNOSIS — Z8679 Personal history of other diseases of the circulatory system: Secondary | ICD-10-CM | POA: Diagnosis not present

## 2021-11-15 DIAGNOSIS — Z01818 Encounter for other preprocedural examination: Secondary | ICD-10-CM | POA: Insufficient documentation

## 2021-11-15 DIAGNOSIS — Z01810 Encounter for preprocedural cardiovascular examination: Secondary | ICD-10-CM | POA: Diagnosis not present

## 2021-11-15 DIAGNOSIS — I2510 Atherosclerotic heart disease of native coronary artery without angina pectoris: Secondary | ICD-10-CM | POA: Diagnosis not present

## 2021-11-15 LAB — NM MYOCAR MULTI W/SPECT W/WALL MOTION / EF
LV dias vol: 49 mL (ref 46–106)
LV sys vol: 11 mL
Nuc Stress EF: 78 %
Peak HR: 115 {beats}/min
RATE: 0.4
Rest HR: 77 {beats}/min
Rest Nuclear Isotope Dose: 9.8 mCi
SDS: 0
SRS: 0
SSS: 0
Stress Nuclear Isotope Dose: 32.2 mCi
TID: 1.06

## 2021-11-15 MED ORDER — TECHNETIUM TC 99M TETROFOSMIN IV KIT
30.0000 | PACK | Freq: Once | INTRAVENOUS | Status: AC | PRN
  Administered 2021-11-15: 32.2 via INTRAVENOUS

## 2021-11-15 MED ORDER — SODIUM CHLORIDE FLUSH 0.9 % IV SOLN
INTRAVENOUS | Status: AC
  Administered 2021-11-15: 10 mL via INTRAVENOUS
  Filled 2021-11-15: qty 10

## 2021-11-15 MED ORDER — TECHNETIUM TC 99M TETROFOSMIN IV KIT
10.0000 | PACK | Freq: Once | INTRAVENOUS | Status: AC | PRN
  Administered 2021-11-15: 9.79 via INTRAVENOUS

## 2021-11-15 MED ORDER — REGADENOSON 0.4 MG/5ML IV SOLN
INTRAVENOUS | Status: AC
  Administered 2021-11-15: 0.4 mg via INTRAVENOUS
  Filled 2021-11-15: qty 5

## 2021-11-19 ENCOUNTER — Other Ambulatory Visit (INDEPENDENT_AMBULATORY_CARE_PROVIDER_SITE_OTHER): Payer: Self-pay | Admitting: *Deleted

## 2021-11-19 DIAGNOSIS — K219 Gastro-esophageal reflux disease without esophagitis: Secondary | ICD-10-CM

## 2021-11-19 DIAGNOSIS — R1319 Other dysphagia: Secondary | ICD-10-CM

## 2021-11-19 MED ORDER — LANSOPRAZOLE 15 MG PO CPDR: 15.0000 mg | DELAYED_RELEASE_CAPSULE | Freq: Two times a day (BID) | ORAL | 3 refills | Status: DC

## 2021-11-27 ENCOUNTER — Ambulatory Visit: Payer: Medicare Other | Admitting: Cardiology

## 2021-11-29 DIAGNOSIS — M25552 Pain in left hip: Secondary | ICD-10-CM | POA: Diagnosis not present

## 2021-11-29 DIAGNOSIS — M25562 Pain in left knee: Secondary | ICD-10-CM | POA: Diagnosis not present

## 2021-12-05 DIAGNOSIS — M25552 Pain in left hip: Secondary | ICD-10-CM | POA: Diagnosis not present

## 2021-12-06 DIAGNOSIS — K5732 Diverticulitis of large intestine without perforation or abscess without bleeding: Secondary | ICD-10-CM | POA: Diagnosis not present

## 2021-12-06 DIAGNOSIS — L299 Pruritus, unspecified: Secondary | ICD-10-CM | POA: Diagnosis not present

## 2021-12-10 ENCOUNTER — Other Ambulatory Visit: Payer: Self-pay | Admitting: Urology

## 2021-12-12 ENCOUNTER — Encounter (HOSPITAL_COMMUNITY): Payer: Medicare Other

## 2021-12-18 DIAGNOSIS — B0089 Other herpesviral infection: Secondary | ICD-10-CM | POA: Diagnosis not present

## 2022-01-13 ENCOUNTER — Other Ambulatory Visit: Payer: Self-pay | Admitting: Pulmonary Disease

## 2022-01-21 ENCOUNTER — Encounter: Payer: Self-pay | Admitting: Pulmonary Disease

## 2022-01-22 ENCOUNTER — Other Ambulatory Visit: Payer: Self-pay

## 2022-01-22 MED ORDER — TRELEGY ELLIPTA 100-62.5-25 MCG/ACT IN AEPB: 1.0000 | INHALATION_SPRAY | Freq: Every day | RESPIRATORY_TRACT | 3 refills | Status: AC

## 2022-01-24 NOTE — Patient Instructions (Addendum)
DUE TO COVID-19 ONLY TWO VISITORS  (aged 64 and older)  ARE ALLOWED TO COME WITH YOU AND STAY IN THE WAITING ROOM ONLY DURING PRE OP AND PROCEDURE.   **NO VISITORS ARE ALLOWED IN THE SHORT STAY AREA OR RECOVERY ROOM!!**  IF YOU WILL BE ADMITTED INTO THE HOSPITAL YOU ARE ALLOWED ONLY FOUR SUPPORT PEOPLE DURING VISITATION HOURS ONLY (7 AM -8PM)   The support person(s) must pass our screening, gel in and out, and wear a mask at all times, including in the patient's room. Patients must also wear a mask when staff or their support person are in the room. Visitors GUEST BADGE MUST BE WORN VISIBLY  One adult visitor may remain with you overnight and MUST be in the room by 8 P.M.     Your procedure is scheduled on: 02/07/22   Report to [facility] Main Entrance    Report to admitting at : 5:30 AM   Call this number if you have problems the morning of surgery [phone]   Clear liquids starting the day before surgery until : 4:30 AM DAY OF SURGERY. Drink plenty fluids the day of the prep.  Water Black Coffee (sugar ok, NO MILK/CREAM OR CREAMERS)  Tea (sugar ok, NO MILK/CREAM OR CREAMERS) regular and decaf                             Plain Jell-O (NO RED)                                           Fruit ices (not with fruit pulp, NO RED)                                     Popsicles (NO RED)                                                                  Juice: apple, WHITE grape, WHITE cranberry Sports drinks like Gatorade (NO RED)              DRINK 2 PRESURGERY ENSURE DRINKS THE NIGHT BEFORE SURGERY AT  1000 PM AND 1 PRESURGERY DRINK THE DAY OF THE PROCEDURE 3 HOURS PRIOR TO SCHEDULED SURGERY. NO SOLIDS AFTER MIDNIGHT THE DAY PRIOR TO THE SURGERY. NOTHING BY MOUTH EXCEPT CLEAR LIQUIDS UNTIL THREE HOURS PRIOR TO SCHEDULED SURGERY. PLEASE FINISH PRESURGERY ENSURE DRINK PER SURGEON ORDER 3 HOURS PRIOR TO SCHEDULED SURGERY TIME WHICH NEEDS TO BE COMPLETED AT : 4:30 AM.     The day of  surgery:  Drink ONE (1) Pre-Surgery Clear Ensure or G2 at AM the morning of surgery. Drink in one sitting. Do not sip.  This drink was given to you during your hospital  pre-op appointment visit. Nothing else to drink after completing the  Pre-Surgery Clear Ensure or G2.          If you have questions, please contact your surgeon's office.   FOLLOW BOWEL PREP AND ANY ADDITIONAL PRE OP INSTRUCTIONS YOU RECEIVED FROM YOUR SURGEON'S OFFICE!!  Oral Hygiene is also important to reduce your risk of infection.                                    Remember - BRUSH YOUR TEETH THE MORNING OF SURGERY WITH YOUR REGULAR TOOTHPASTE   Do NOT smoke after Midnight   Take these medicines the morning of surgery with A SIP OF WATER: roflumilast,escitalopram,cetirizine,valacyclovir,diltiazem,oxybutynin.Hydroxyzine,alprazolam as needed.  DO NOT TAKE ANY ORAL DIABETIC MEDICATIONS DAY OF YOUR SURGERY  Bring CPAP mask and tubing day of surgery.                              You may not have any metal on your body including hair pins, jewelry, and body piercing             Do not wear make-up, lotions, powders, perfumes/cologne, or deodorant  Do not wear nail polish including gel and S&S, artificial/acrylic nails, or any other type of covering on natural nails including finger and toenails. If you have artificial nails, gel coating, etc. that needs to be removed by a nail salon please have this removed prior to surgery or surgery may need to be canceled/ delayed if the surgeon/ anesthesia feels like they are unable to be safely monitored.   Do not shave  48 hours prior to surgery.    Do not bring valuables to the hospital. [place].   Contacts, dentures or bridgework may not be worn into surgery.   Bring small overnight bag day of surgery.   DO NOT [place]. PHARMACY WILL DISPENSE MEDICATIONS LISTED ON YOUR MEDICATION  LIST TO YOU DURING YOUR ADMISSION [place]!    Patients discharged on the day of surgery will not be allowed to drive home.  Someone NEEDS to stay with you for the first 24 hours after anesthesia.   Special Instructions: Bring a copy of your healthcare power of attorney and living will documents         the day of surgery if you haven't scanned them before.              Please read over the following fact sheets you were given: IF YOU HAVE QUESTIONS ABOUT YOUR PRE-OP INSTRUCTIONS PLEASE CALL [phone]     [facility] - Preparing for Surgery Before surgery, you can play an important role.  Because skin is not sterile, your skin needs to be as free of germs as possible.  You can reduce the number of germs on your skin by washing with CHG (chlorahexidine gluconate) soap before surgery.  CHG is an antiseptic cleaner which kills germs and bonds with the skin to continue killing germs even after washing. Please DO NOT use if you have an allergy to CHG or antibacterial soaps.  If your skin becomes reddened/irritated stop using the CHG and inform your nurse when you arrive at Short Stay. Do not shave (including legs and underarms) for at least 48 hours prior to the first CHG shower.  You may shave your face/neck. Please follow these instructions carefully:  1.  Shower with CHG Soap the night before surgery and the  morning of Surgery.  2.  If you choose to wash your hair, wash your  hair first as usual with your  normal  shampoo.  3.  After you shampoo, rinse your hair and body thoroughly to remove the  shampoo.                           4.  Use CHG as you would any other liquid soap.  You can apply chg directly  to the skin and wash                       Gently with a scrungie or clean washcloth.  5.  Apply the CHG Soap to your body ONLY FROM THE NECK DOWN.   Do not use on face/ open                           Wound or open sores. Avoid contact with eyes, ears mouth and genitals (private parts).                        Wash face,  Genitals (private parts) with your normal soap.             6.  Wash thoroughly, paying special attention to the area where your surgery  will be performed.  7.  Thoroughly rinse your body with warm water from the neck down.  8.  DO NOT shower/wash with your normal soap after using and rinsing off  the CHG Soap.                9.  Pat yourself dry with a clean towel.            10.  Wear clean pajamas.            11.  Place clean sheets on your bed the night of your first shower and do not  sleep with pets. Day of Surgery : Do not apply any lotions/deodorants the morning of surgery.  Please wear clean clothes to the hospital/surgery center.  FAILURE TO FOLLOW THESE INSTRUCTIONS MAY RESULT IN THE CANCELLATION OF YOUR SURGERY PATIENT SIGNATURE_________________________________  NURSE SIGNATURE__________________________________  ________________________________________________________________________   Adam Phenix  An incentive spirometer is a tool that can help keep your lungs clear and active. This tool measures how well you are filling your lungs with each breath. Taking long deep breaths may help reverse or decrease the chance of developing breathing (pulmonary) problems (especially infection) following: A long period of time when you are unable to move or be active. BEFORE THE PROCEDURE  If the spirometer includes an indicator to show your best effort, your nurse or respiratory therapist will set it to a desired goal. If possible, sit up straight or lean slightly forward. Try not to slouch. Hold the incentive spirometer in an upright position. INSTRUCTIONS FOR USE  Sit on the edge of your bed if possible, or sit up as far as you can in bed or on a chair. Hold the incentive spirometer in an upright position. Breathe out normally. Place the mouthpiece in your mouth and seal your lips tightly around it. Breathe in slowly and as deeply as  possible, raising the piston or the ball toward the top of the column. Hold your breath for 3-5 seconds or for as long as possible. Allow the piston or ball to fall to the bottom of the column. Remove the mouthpiece from your mouth and breathe out normally. Rest  for a few seconds and repeat Steps 1 through 7 at least 10 times every 1-2 hours when you are awake. Take your time and take a few normal breaths between deep breaths. The spirometer may include an indicator to show your best effort. Use the indicator as a goal to work toward during each repetition. After each set of 10 deep breaths, practice coughing to be sure your lungs are clear. If you have an incision (the cut made at the time of surgery), support your incision when coughing by placing a pillow or rolled up towels firmly against it. Once you are able to get out of bed, walk around indoors and cough well. You may stop using the incentive spirometer when instructed by your caregiver.  RISKS AND COMPLICATIONS Take your time so you do not get dizzy or light-headed. If you are in pain, you may need to take or ask for pain medication before doing incentive spirometry. It is harder to take a deep breath if you are having pain. AFTER USE Rest and breathe slowly and easily. It can be helpful to keep track of a log of your progress. Your caregiver can provide you with a simple table to help with this. If you are using the spirometer at home, follow these instructions: [place] IF:  You are having difficultly using the spirometer. You have trouble using the spirometer as often as instructed. Your pain medication is not giving enough relief while using the spirometer. You develop fever of 100.5 F (38.1 C) or higher. SEEK IMMEDIATE MEDICAL CARE IF:  You cough up bloody sputum that had not been present before. You develop fever of 102 F (38.9 C) or greater. You develop worsening pain at or near the incision site. MAKE SURE YOU:   Understand these instructions. Will watch your condition. Will get help right away if you are not doing well or get worse. Document Released: 09/02/2006 Document Revised: 07/15/2011 Document Reviewed: 11/03/2006 [facility] Patient Information 2014 [place], [place].   ________________________________________________________________________

## 2022-01-25 ENCOUNTER — Encounter (HOSPITAL_COMMUNITY)
Admission: RE | Admit: 2022-01-25 | Discharge: 2022-01-25 | Disposition: A | Discharge status: Home / Self Care | Payer: Medicare Other | Source: Ambulatory Visit | Attending: Surgery | Admitting: Surgery

## 2022-01-25 ENCOUNTER — Encounter (HOSPITAL_COMMUNITY): Payer: Self-pay

## 2022-01-25 ENCOUNTER — Other Ambulatory Visit: Payer: Self-pay

## 2022-01-25 ENCOUNTER — Ambulatory Visit: Payer: Self-pay | Admitting: Surgery

## 2022-01-25 VITALS — BP 128/74 | HR 84 | Temp 98.4°F | Ht 61.0 in

## 2022-01-25 DIAGNOSIS — R739 Hyperglycemia, unspecified: Secondary | ICD-10-CM | POA: Insufficient documentation

## 2022-01-25 DIAGNOSIS — K6389 Other specified diseases of intestine: Secondary | ICD-10-CM

## 2022-01-25 DIAGNOSIS — Z01818 Encounter for other preprocedural examination: Secondary | ICD-10-CM | POA: Insufficient documentation

## 2022-01-25 DIAGNOSIS — I2510 Atherosclerotic heart disease of native coronary artery without angina pectoris: Secondary | ICD-10-CM

## 2022-01-25 HISTORY — DX: Prediabetes: R73.03

## 2022-01-25 LAB — BASIC METABOLIC PANEL
Anion gap: 8 (ref 5–15)
BUN: 10 mg/dL (ref 8–23)
CO2: 28 mmol/L (ref 22–32)
Calcium: 8.8 mg/dL — ABNORMAL LOW (ref 8.9–10.3)
Chloride: 103 mmol/L (ref 98–111)
Creatinine, Ser: 0.59 mg/dL (ref 0.44–1.00)
GFR, Estimated: >60 mL/min (ref >60)
Glucose, Bld: 115 mg/dL — ABNORMAL HIGH (ref 70–99)
Potassium: 4.2 mmol/L (ref 3.5–5.1)
Sodium: 139 mmol/L (ref 135–145)

## 2022-01-25 LAB — CBC
HCT: 37.4 % (ref 36.0–46.0)
Hemoglobin: 12.2 g/dL (ref 12.0–15.0)
MCH: 31.5 pg (ref 26.0–34.0)
MCHC: 32.6 g/dL (ref 30.0–36.0)
MCV: 96.6 fL (ref 80.0–100.0)
Platelets: 327 10*3/uL (ref 150–400)
RBC: 3.87 MIL/uL (ref 3.87–5.11)
RDW: 14.1 % (ref 11.5–15.5)
WBC: 14.2 10*3/uL — ABNORMAL HIGH (ref 4.0–10.5)
nRBC: 0 % (ref 0.0–0.2)

## 2022-01-25 LAB — HEMOGLOBIN A1C
Hgb A1c MFr Bld: 5.9 % — ABNORMAL HIGH (ref 4.8–5.6)
Mean Plasma Glucose: 122.63 mg/dL

## 2022-01-25 NOTE — Progress Notes (Signed)
Dr. Johney [place] office was reached yesterday by RN on regard this pt. surgery schedule.As per the schedule,it said possible ostomy. RN wants to clarify,if wound nurse consult was needed.As per Dr. Johney [place] response,pt. Does not need wound nurse consult.

## 2022-01-25 NOTE — Progress Notes (Addendum)
For Short Stay: [place] appointment date: Date of COVID positive in last 73 days:  Bowel Prep reminder:   For Anesthesia: PCP - Dr. Asencion Noble Cardiologist - Dr. Rozann Lesches [place]: Sande Rives: PA-C: 11/12/21 Chest x-ray -  EKG - 11/12/21 Stress Test -  ECHO - 10/31/20 Cardiac Cath - 07/05/03 Pacemaker/ICD device last checked: Pacemaker orders received: Device Rep notified:  Spinal Cord Stimulator:  Sleep Study - Yes CPAP - Yes: BPAP.  Fasting Blood Sugar -  Checks Blood Sugar _____ times a day Date and result of last Hgb A1c-  Blood Thinner Instructions: Aspirin Instructions:Can be hold 5 -7 days as per cardiologist. Last Dose:  Activity level: Can go up a flight of stairs and activities of daily living without stopping and without chest pain and/or shortness of breath   Able to exercise without chest pain and/or shortness of breath   Unable to go up a flight of stairs without chest pain and/or shortness of breath     Anesthesia review: Hx: COPD,O2 dependent: $L,SVT,OSA.(B_PAP). Pt. Wants to make sure the BPAP is ready to used post intubation,she said she had confronted before, life threatened situations because the staff did not followed the post op care recommendations for her.  Patient denies shortness of breath, fever, cough and chest pain at PAT appointment   Patient verbalized understanding of instructions that were given to them at the PAT appointment. Patient was also instructed that they will need to review over the PAT instructions again at home before surgery.

## 2022-01-31 NOTE — Anesthesia Preprocedure Evaluation (Addendum)
Anesthesia Evaluation  Patient identified by MRN, date of birth, ID band Patient awake  General Assessment Comment:Discussed with patient in preop and prior to GA high risk for surgery and answered all questions  Reviewed: Allergy & Precautions, NPO status   Airway Mallampati: II       Dental   Pulmonary shortness of breath, sleep apnea , pneumonia, COPD, former smoker,    breath sounds clear to auscultation       Cardiovascular + dysrhythmias  Rhythm:Regular Rate:Normal  Hx noted Dr. Nyoka Cowden   Neuro/Psych  Neuromuscular disease    GI/Hepatic Neg liver ROS, GERD  ,  Endo/Other  negative endocrine ROS  Renal/GU negative Renal ROS     Musculoskeletal  (+) Arthritis ,   Abdominal   Peds  Hematology   Anesthesia Other Findings   Reproductive/Obstetrics                          Anesthesia Physical Anesthesia Plan  ASA: 3  Anesthesia Plan: General   Post-op Pain Management:    Induction: Intravenous  PONV Risk Score and Plan: 3 and Ondansetron, Dexamethasone, Midazolam and Treatment may vary due to age or medical condition  Airway Management Planned: Oral ETT  Additional Equipment: Arterial line  Intra-op Plan:   Post-operative Plan: Post-operative intubation/ventilation  Informed Consent: I have reviewed the patients History and Physical, chart, labs and discussed the procedure including the risks, benefits and alternatives for the proposed anesthesia with the patient or authorized representative who has indicated his/her understanding and acceptance.     Dental advisory given  Plan Discussed with: CRNA and Anesthesiologist  Anesthesia Plan Comments: (See PAT note 01/25/2022)    Anesthesia Quick Evaluation

## 2022-01-31 NOTE — Progress Notes (Signed)
Anesthesia Chart Review   Case: [id number] Date/Time: 02/07/22 0715   Procedures:      ROBOTIC RESECTION OF COLON SIGMOID     POSSIBLE OSTOMY     RIGID PROCTOSCOPY     CYSTOSCOPY with FIREFLY INJECTION   Anesthesia type: General   Pre-op diagnosis: STRICTURE OF COLON   Location: [facility] ROOM 02 / [facility]   Surgeons: Michael Boston, MD; Raynelle Bring, MD       DISCUSSION:66 y.o. former smoker with h/o COPD O2 dependent, sleep apnea, colon stricture scheduled for above procedure 02/07/2022 with Dr. Michael Boston and Dr. Raynelle Bring.   Pt last seen by cardiology 11/12/2021. Per OV note, "ADDENDUM 11/16/2021: Patient's Myoview came back low risk with no evidence of ischemia. Therefore, OK to proceed with robotic colectomy. She is at elevated risk mainly due to her severe COPD. Patient states Pulmonology has already given her the OK to proceed with surgery as well. Would recommended continuing Cardizem perioperatively given history of SVT. She can hold Aspirin for 5-7 days if needed prior to surgery but would restart this as soon as safely possible postoperatively. As mentioned above, patient wanted surgery team to know that it is very important that she be allowed to wear BIPAP after surgery.  I will route this recommendation to the requesting party via Epic fax function."  Pt last seen by pulmonology 08/27/2021. Per OV note, "discussed that she would be at high risk for perioperative respiratory complications if she needed to have surgery, and at that point it would be risk/benefit analysis and what her comfort level is with risk about proceeding with surgery - given the concern that she could have colon cancer she will need to proceed with colonoscopy; pulmonary service can be available as needed in the hospital after she has her procedure"  Anticipate pt can proceed with planned procedure barring acute status change and after evaluation DOS.  VS: BP 128/74   Pulse 84   Temp 36.9 C (Oral)   Ht '5\' 1"'$   (1.549 m)   SpO2 100% Comment: 4L O2  BMI 25.05 kg/m   PROVIDERS: Asencion Noble, MD is PCP   Cardiologist - Dr. Benjaman Lobe, MD is Pulmonologist  LABS: Labs reviewed: Acceptable for surgery. (all labs ordered are listed, but only abnormal results are displayed)  Labs Reviewed  HEMOGLOBIN A1C - Abnormal; Notable for the following components:      Result Value   Hgb A1c MFr Bld 5.9 (*)    All other components within normal limits  BASIC METABOLIC PANEL - Abnormal; Notable for the following components:   Glucose, Bld 115 (*)    Calcium 8.8 (*)    All other components within normal limits  CBC - Abnormal; Notable for the following components:   WBC 14.2 (*)    All other components within normal limits     IMAGES:   EKG:   CV: Myocardial Perfusion 11/15/2021   Nonspecific T wave abnormality at baseline. The ECG shows premature ventricular contractions.   The patient reported dyspnea and headaches during the stress test. Normal blood pressure and normal heart rate response noted during stress. Heart rate recovery was normal.   There were depening of the T wave inversions in the inferolateral leads with infusion compared to baseline EKG.   LV perfusion is normal. There is no evidence of ischemia. There is no evidence of infarction.   Left ventricular function is normal. Nuclear stress EF: 78 %. End diastolic cavity  size is normal. End systolic cavity size is normal.   The study is normal. The study is low risk.  Echo 10/31/2020  1. Left ventricular ejection fraction, by estimation, is 55 to 60%. The  left ventricle has normal function. The left ventricle has no regional  wall motion abnormalities. Left ventricular diastolic parameters are  consistent with Grade I diastolic  dysfunction (impaired relaxation).   2. Right ventricular systolic function is normal. The right ventricular  size is normal.   3. The mitral valve is normal in structure. No evidence of  mitral valve  regurgitation. No evidence of mitral stenosis.   4. The aortic valve has an indeterminant number of cusps. Aortic valve  regurgitation is not visualized. No aortic stenosis is present.   5. The inferior vena cava is normal in size with greater than 50%  respiratory variability, suggesting right atrial pressure of 3 mmHg.  Past Medical History:  Diagnosis Date   Allergy    Anxiety    Arthritis    COPD (chronic obstructive pulmonary disease) ([place])    Home oxygen at [facility]   Depression    Diverticulosis    Dyspnea    Dysrhythmia    History of cardiac catheterization    No significant CAD 2005   Hyperlipidemia    Hypoglycemia    IBS (irritable bowel syndrome)    Neuromuscular disorder (H[facility])    Osteoporosis    Paroxysmal SVT (supraventricular tachycardia) ([place])    Pneumonia 2015   Pre-diabetes    Shingles 2012   Sleep apnea     Past Surgical History:  Procedure Laterality Date   BACK SURGERY     BIOPSY  09/12/2021   Procedure: BIOPSY;  Surgeon: Rogene Houston, MD;  [facility];  Service: Endoscopy;;   BREAST SURGERY     CARDIAC CATHETERIZATION  2004   COLONOSCOPY  03/22/2011   Procedure: COLONOSCOPY;  Surgeon: Rogene Houston, MD;  [facility];  Service: Endoscopy;  Laterality: N/A;   COLONOSCOPY WITH PROPOFOL N/A 05/16/2017   Procedure: COLONOSCOPY WITH PROPOFOL;  Surgeon: Rogene Houston, MD;  [facility];  Service: Endoscopy;  Laterality: N/A;   COLONOSCOPY WITH PROPOFOL N/A 09/12/2021   Procedure: COLONOSCOPY WITH PROPOFOL;  Surgeon: Rogene Houston, MD;  [facility];  Service: Endoscopy;  Laterality: N/A;  1230 ASA 2   ESOPHAGEAL DILATION N/A 04/19/2020   Procedure: ESOPHAGEAL DILATION;  Surgeon: Rogene Houston, MD;  [facility];  Service: Endoscopy;  Laterality: N/A;   ESOPHAGOGASTRODUODENOSCOPY (EGD) WITH PROPOFOL N/A 04/19/2020   Procedure: ESOPHAGOGASTRODUODENOSCOPY (EGD) WITH PROPOFOL;   Surgeon: Rogene Houston, MD;  [facility];  Service: Endoscopy;  Laterality: N/A;  9:15   ESOPHAGOGASTRODUODENOSCOPY (EGD) WITH PROPOFOL N/A 09/12/2021   Procedure: ESOPHAGOGASTRODUODENOSCOPY (EGD) WITH PROPOFOL;  Surgeon: Rogene Houston, MD;  [facility];  Service: Endoscopy;  Laterality: N/A;   FLEXIBLE SIGMOIDOSCOPY N/A 09/26/2017   Procedure: FLEXIBLE SIGMOIDOSCOPY with Propofol ;  Surgeon: Rogene Houston, MD;  [facility];  Service: Endoscopy;  Laterality: N/A;  9:30   POLYPECTOMY  05/16/2017   Procedure: POLYPECTOMY;  Surgeon: Rogene Houston, MD;  [facility];  Service: Endoscopy;;   POLYPECTOMY  09/12/2021   Procedure: POLYPECTOMY INTESTINAL;  Surgeon: Rogene Houston, MD;  [facility];  Service: Endoscopy;;   SPINAL FUSION     VULVECTOMY      MEDICATIONS:  albuterol (VENTOLIN HFA) 108 (90 Base) MCG/ACT inhaler   ALPRAZolam (XANAX) 0.25 MG tablet   aspirin 81 MG tablet   atorvastatin (LIPITOR) 40 MG tablet   azelastine (ASTELIN) 0.1 % nasal spray   carboxymethylcellulose 1 % ophthalmic solution   cetirizine (ZYRTEC) 10 MG tablet   clonazePAM (KLONOPIN) 0.5 MG tablet   cyclobenzaprine (FLEXERIL) 10 MG tablet   diclofenac Sodium (VOLTAREN) 1 % GEL   dicyclomine (BENTYL) 10 MG capsule   diltiazem (CARDIZEM CD) 240 MG 24 hr capsule   diltiazem (CARDIZEM) 30 MG tablet   escitalopram (LEXAPRO) 20 MG tablet   fluticasone (FLONASE) 50 MCG/ACT nasal spray   Fluticasone-Umeclidin-Vilant (TRELEGY ELLIPTA) 100-62.5-25 MCG/ACT AEPB   HYDROcodone-acetaminophen (NORCO/VICODIN) 5-325 MG tablet   hydrOXYzine (ATARAX) 25 MG tablet   ibuprofen (ADVIL) 200 MG tablet   lansoprazole (PREVACID) 15 MG capsule   metroNIDAZOLE (FLAGYL) 500 MG tablet   montelukast (SINGULAIR) 10 MG tablet   NON FORMULARY   oxybutynin (DITROPAN) 5 MG tablet   OXYGEN   roflumilast (DALIRESP) 500 MCG TABS tablet   traMADol (ULTRAM) 50 MG tablet    traZODone (DESYREL) 50 MG tablet   valACYclovir (VALTREX) 1000 MG tablet   valACYclovir (VALTREX) 500 MG tablet   No current facility-administered medications for this encounter.    Konrad Felix Ward, PA-C [facility]-Surgical Testing [phone]

## 2022-02-07 ENCOUNTER — Inpatient Hospital Stay (HOSPITAL_COMMUNITY)
Admission: RE | Admit: 2022-02-07 | Discharge: 2022-02-11 | DRG: 329 | Disposition: A | Discharge status: Home / Self Care | Payer: Medicare Other | Attending: Surgery | Admitting: Surgery

## 2022-02-07 ENCOUNTER — Encounter (HOSPITAL_COMMUNITY)
Admission: RE | Disposition: A | Discharge status: Home / Self Care | Payer: Self-pay | Source: Home / Self Care | Attending: Surgery

## 2022-02-07 ENCOUNTER — Other Ambulatory Visit: Payer: Self-pay

## 2022-02-07 ENCOUNTER — Inpatient Hospital Stay (HOSPITAL_COMMUNITY): Payer: Medicare Other | Admitting: Physician Assistant

## 2022-02-07 ENCOUNTER — Encounter (HOSPITAL_COMMUNITY): Payer: Self-pay | Admitting: Surgery

## 2022-02-07 ENCOUNTER — Inpatient Hospital Stay (HOSPITAL_COMMUNITY): Payer: Medicare Other | Admitting: Anesthesiology

## 2022-02-07 DIAGNOSIS — Z408 Encounter for other prophylactic surgery: Secondary | ICD-10-CM | POA: Diagnosis not present

## 2022-02-07 DIAGNOSIS — G928 Other toxic encephalopathy: Secondary | ICD-10-CM | POA: Diagnosis present

## 2022-02-07 DIAGNOSIS — J309 Allergic rhinitis, unspecified: Secondary | ICD-10-CM | POA: Diagnosis present

## 2022-02-07 DIAGNOSIS — Z7951 Long term (current) use of inhaled steroids: Secondary | ICD-10-CM

## 2022-02-07 DIAGNOSIS — Z79899 Other long term (current) drug therapy: Secondary | ICD-10-CM

## 2022-02-07 DIAGNOSIS — K651 Peritoneal abscess: Secondary | ICD-10-CM | POA: Diagnosis present

## 2022-02-07 DIAGNOSIS — F32A Depression, unspecified: Secondary | ICD-10-CM | POA: Diagnosis present

## 2022-02-07 DIAGNOSIS — Z880 Allergy status to penicillin: Secondary | ICD-10-CM

## 2022-02-07 DIAGNOSIS — N736 Female pelvic peritoneal adhesions (postinfective): Secondary | ICD-10-CM | POA: Diagnosis present

## 2022-02-07 DIAGNOSIS — F419 Anxiety disorder, unspecified: Secondary | ICD-10-CM | POA: Diagnosis present

## 2022-02-07 DIAGNOSIS — Z808 Family history of malignant neoplasm of other organs or systems: Secondary | ICD-10-CM

## 2022-02-07 DIAGNOSIS — Z9981 Dependence on supplemental oxygen: Secondary | ICD-10-CM

## 2022-02-07 DIAGNOSIS — G4730 Sleep apnea, unspecified: Secondary | ICD-10-CM | POA: Diagnosis not present

## 2022-02-07 DIAGNOSIS — K56699 Other intestinal obstruction unspecified as to partial versus complete obstruction: Principal | ICD-10-CM

## 2022-02-07 DIAGNOSIS — J9612 Chronic respiratory failure with hypercapnia: Secondary | ICD-10-CM | POA: Diagnosis present

## 2022-02-07 DIAGNOSIS — G8929 Other chronic pain: Secondary | ICD-10-CM | POA: Diagnosis present

## 2022-02-07 DIAGNOSIS — J439 Emphysema, unspecified: Secondary | ICD-10-CM | POA: Diagnosis present

## 2022-02-07 DIAGNOSIS — E785 Hyperlipidemia, unspecified: Secondary | ICD-10-CM | POA: Diagnosis present

## 2022-02-07 DIAGNOSIS — K5720 Diverticulitis of large intestine with perforation and abscess without bleeding: Secondary | ICD-10-CM | POA: Diagnosis present

## 2022-02-07 DIAGNOSIS — K660 Peritoneal adhesions (postprocedural) (postinfection): Secondary | ICD-10-CM | POA: Diagnosis not present

## 2022-02-07 DIAGNOSIS — K5732 Diverticulitis of large intestine without perforation or abscess without bleeding: Secondary | ICD-10-CM | POA: Diagnosis present

## 2022-02-07 DIAGNOSIS — G4733 Obstructive sleep apnea (adult) (pediatric): Secondary | ICD-10-CM | POA: Diagnosis present

## 2022-02-07 DIAGNOSIS — M810 Age-related osteoporosis without current pathological fracture: Secondary | ICD-10-CM | POA: Diagnosis present

## 2022-02-07 DIAGNOSIS — Z803 Family history of malignant neoplasm of breast: Secondary | ICD-10-CM | POA: Diagnosis not present

## 2022-02-07 DIAGNOSIS — Z8249 Family history of ischemic heart disease and other diseases of the circulatory system: Secondary | ICD-10-CM

## 2022-02-07 DIAGNOSIS — T884XXA Failed or difficult intubation, initial encounter: Secondary | ICD-10-CM

## 2022-02-07 DIAGNOSIS — K219 Gastro-esophageal reflux disease without esophagitis: Secondary | ICD-10-CM | POA: Diagnosis present

## 2022-02-07 DIAGNOSIS — Z91041 Radiographic dye allergy status: Secondary | ICD-10-CM

## 2022-02-07 DIAGNOSIS — Z8701 Personal history of pneumonia (recurrent): Secondary | ICD-10-CM

## 2022-02-07 DIAGNOSIS — N838 Other noninflammatory disorders of ovary, fallopian tube and broad ligament: Secondary | ICD-10-CM | POA: Diagnosis not present

## 2022-02-07 DIAGNOSIS — J9611 Chronic respiratory failure with hypoxia: Secondary | ICD-10-CM | POA: Diagnosis present

## 2022-02-07 DIAGNOSIS — N7011 Chronic salpingitis: Secondary | ICD-10-CM | POA: Diagnosis not present

## 2022-02-07 DIAGNOSIS — Z888 Allergy status to other drugs, medicaments and biological substances status: Secondary | ICD-10-CM

## 2022-02-07 DIAGNOSIS — Z981 Arthrodesis status: Secondary | ICD-10-CM

## 2022-02-07 DIAGNOSIS — J449 Chronic obstructive pulmonary disease, unspecified: Secondary | ICD-10-CM | POA: Diagnosis not present

## 2022-02-07 DIAGNOSIS — K56609 Unspecified intestinal obstruction, unspecified as to partial versus complete obstruction: Secondary | ICD-10-CM | POA: Diagnosis not present

## 2022-02-07 DIAGNOSIS — Z833 Family history of diabetes mellitus: Secondary | ICD-10-CM

## 2022-02-07 DIAGNOSIS — D539 Nutritional anemia, unspecified: Secondary | ICD-10-CM | POA: Diagnosis present

## 2022-02-07 DIAGNOSIS — Z825 Family history of asthma and other chronic lower respiratory diseases: Secondary | ICD-10-CM | POA: Diagnosis not present

## 2022-02-07 DIAGNOSIS — N83202 Unspecified ovarian cyst, left side: Secondary | ICD-10-CM | POA: Diagnosis not present

## 2022-02-07 DIAGNOSIS — K5669 Other partial intestinal obstruction: Secondary | ICD-10-CM | POA: Diagnosis not present

## 2022-02-07 DIAGNOSIS — Z881 Allergy status to other antibiotic agents status: Secondary | ICD-10-CM

## 2022-02-07 DIAGNOSIS — T4145XA Adverse effect of unspecified anesthetic, initial encounter: Secondary | ICD-10-CM | POA: Diagnosis present

## 2022-02-07 DIAGNOSIS — Z7982 Long term (current) use of aspirin: Secondary | ICD-10-CM

## 2022-02-07 DIAGNOSIS — Z87891 Personal history of nicotine dependence: Secondary | ICD-10-CM | POA: Diagnosis not present

## 2022-02-07 DIAGNOSIS — Z8261 Family history of arthritis: Secondary | ICD-10-CM

## 2022-02-07 DIAGNOSIS — K644 Residual hemorrhoidal skin tags: Secondary | ICD-10-CM | POA: Insufficient documentation

## 2022-02-07 HISTORY — DX: Failed or difficult intubation, initial encounter: T88.4XXA

## 2022-02-07 HISTORY — PX: OSTOMY: SHX5997

## 2022-02-07 HISTORY — PX: PROCTOSCOPY: SHX2266

## 2022-02-07 LAB — POCT I-STAT 7, (LYTES, BLD GAS, ICA,H+H)
Acid-Base Excess: 4 mmol/L — ABNORMAL HIGH (ref 0.0–2.0)
Acid-Base Excess: 1 mmol/L (ref 0.0–2.0)
Bicarbonate: 30.4 mmol/L — ABNORMAL HIGH (ref 20.0–28.0)
Bicarbonate: 28.6 mmol/L — ABNORMAL HIGH (ref 20.0–28.0)
Calcium, Ion: 1.13 mmol/L — ABNORMAL LOW (ref 1.15–1.40)
Calcium, Ion: 1.11 mmol/L — ABNORMAL LOW (ref 1.15–1.40)
HCT: 27 % — ABNORMAL LOW (ref 36.0–46.0)
HCT: 28 % — ABNORMAL LOW (ref 36.0–46.0)
Hemoglobin: 9.2 g/dL — ABNORMAL LOW (ref 12.0–15.0)
Hemoglobin: 9.5 g/dL — ABNORMAL LOW (ref 12.0–15.0)
O2 Saturation: 100 %
O2 Saturation: 100 %
Patient temperature: 35.7
Patient temperature: 36.1
Potassium: 3.1 mmol/L — ABNORMAL LOW (ref 3.5–5.1)
Potassium: 2.8 mmol/L — ABNORMAL LOW (ref 3.5–5.1)
Sodium: 139 mmol/L (ref 135–145)
Sodium: 139 mmol/L (ref 135–145)
TCO2: 32 mmol/L (ref 22–32)
TCO2: 30 mmol/L (ref 22–32)
pCO2 arterial: 50.7 mmHg — ABNORMAL HIGH (ref 32–48)
pCO2 arterial: 60.5 mmHg — ABNORMAL HIGH (ref 32–48)
pH, Arterial: 7.38 (ref 7.35–7.45)
pH, Arterial: 7.278 — ABNORMAL LOW (ref 7.35–7.45)
pO2, Arterial: 375 mmHg — ABNORMAL HIGH (ref 83–108)
pO2, Arterial: 269 mmHg — ABNORMAL HIGH (ref 83–108)

## 2022-02-07 LAB — GLUCOSE, CAPILLARY: Glucose-Capillary: 126 mg/dL — ABNORMAL HIGH (ref 70–99)

## 2022-02-07 LAB — MRSA NEXT GEN BY PCR, NASAL: MRSA by PCR Next Gen: NOT DETECTED

## 2022-02-07 SURGERY — COLECTOMY, SIGMOID, ROBOT-ASSISTED
Anesthesia: General | Site: Abdomen

## 2022-02-07 MED ORDER — ROFLUMILAST 500 MCG PO TABS
500.0000 ug | ORAL_TABLET | Freq: Every day | ORAL | Status: DC
  Administered 2022-02-08 – 2022-02-11 (×4): 500 ug via ORAL
  Filled 2022-02-07 (×4): qty 1

## 2022-02-07 MED ORDER — BUPIVACAINE LIPOSOME 1.3 % IJ SUSP
INTRAMUSCULAR | Status: AC
  Filled 2022-02-07: qty 20

## 2022-02-07 MED ORDER — HYDROMORPHONE HCL 1 MG/ML IJ SOLN
0.5000 mg | INTRAMUSCULAR | Status: DC | PRN
  Administered 2022-02-07 – 2022-02-08 (×5): 0.5 mg via INTRAVENOUS
  Administered 2022-02-09 – 2022-02-10 (×2): 1 mg via INTRAVENOUS
  Administered 2022-02-10: 2 mg via INTRAVENOUS
  Administered 2022-02-10 – 2022-02-11 (×2): 1 mg via INTRAVENOUS
  Filled 2022-02-07: qty 1
  Filled 2022-02-07: qty 2
  Filled 2022-02-07 (×8): qty 1

## 2022-02-07 MED ORDER — ALBUTEROL SULFATE (2.5 MG/3ML) 0.083% IN NEBU
INHALATION_SOLUTION | RESPIRATORY_TRACT | Status: AC
  Administered 2022-02-07: 2.5 mg via RESPIRATORY_TRACT
  Filled 2022-02-07: qty 3

## 2022-02-07 MED ORDER — HYDRALAZINE HCL 20 MG/ML IJ SOLN: 10.0000 mg | INTRAMUSCULAR | Status: DC | PRN

## 2022-02-07 MED ORDER — LIP MEDEX EX OINT
TOPICAL_OINTMENT | Freq: Two times a day (BID) | CUTANEOUS | Status: DC
  Administered 2022-02-08: 75 via TOPICAL
  Administered 2022-02-09: 1 via TOPICAL
  Administered 2022-02-10: 75 via TOPICAL
  Filled 2022-02-07 (×2): qty 7

## 2022-02-07 MED ORDER — CHLORHEXIDINE GLUCONATE 0.12 % MT SOLN
15.0000 mL | Freq: Once | OROMUCOSAL | Status: AC
  Administered 2022-02-07: 15 mL via OROMUCOSAL

## 2022-02-07 MED ORDER — LACTATED RINGERS IV SOLN: INTRAVENOUS | Status: DC

## 2022-02-07 MED ORDER — SODIUM CHLORIDE 0.9 % IV SOLN
250.0000 mL | INTRAVENOUS | Status: DC | PRN
  Administered 2022-02-08: 250 mL via INTRAVENOUS

## 2022-02-07 MED ORDER — SODIUM CHLORIDE 0.9% FLUSH
3.0000 mL | Freq: Two times a day (BID) | INTRAVENOUS | Status: DC
  Administered 2022-02-07 – 2022-02-11 (×8): 3 mL via INTRAVENOUS

## 2022-02-07 MED ORDER — MONTELUKAST SODIUM 10 MG PO TABS
10.0000 mg | ORAL_TABLET | Freq: Every day | ORAL | Status: DC
  Administered 2022-02-07 – 2022-02-10 (×4): 10 mg via ORAL
  Filled 2022-02-07 (×4): qty 1

## 2022-02-07 MED ORDER — SODIUM CHLORIDE 0.9 % IV SOLN
1.0000 g | INTRAVENOUS | Status: AC
  Administered 2022-02-07: 1 g via INTRAVENOUS
  Filled 2022-02-07: qty 1

## 2022-02-07 MED ORDER — MIDAZOLAM HCL 2 MG/2ML IJ SOLN
INTRAMUSCULAR | Status: DC | PRN
  Administered 2022-02-07: .5 mg via INTRAVENOUS

## 2022-02-07 MED ORDER — IPRATROPIUM-ALBUTEROL 0.5-2.5 (3) MG/3ML IN SOLN: 3.0000 mL | RESPIRATORY_TRACT | Status: DC | PRN

## 2022-02-07 MED ORDER — NEOMYCIN SULFATE 500 MG PO TABS: 1000.0000 mg | ORAL_TABLET | ORAL | Status: DC

## 2022-02-07 MED ORDER — FLUTICASONE FUROATE-VILANTEROL 100-25 MCG/ACT IN AEPB
1.0000 | INHALATION_SPRAY | Freq: Every day | RESPIRATORY_TRACT | Status: DC
  Administered 2022-02-08 – 2022-02-11 (×3): 1 via RESPIRATORY_TRACT
  Filled 2022-02-07 (×2): qty 28

## 2022-02-07 MED ORDER — ACETAMINOPHEN 500 MG PO TABS
1000.0000 mg | ORAL_TABLET | Freq: Four times a day (QID) | ORAL | Status: DC | PRN
  Administered 2022-02-07: 1000 mg via ORAL
  Filled 2022-02-07: qty 2

## 2022-02-07 MED ORDER — PHENYLEPHRINE HCL-NACL 20-0.9 MG/250ML-% IV SOLN
INTRAVENOUS | Status: DC | PRN
  Administered 2022-02-07: 50 ug/min via INTRAVENOUS

## 2022-02-07 MED ORDER — CELECOXIB 200 MG PO CAPS
200.0000 mg | ORAL_CAPSULE | ORAL | Status: AC
  Administered 2022-02-07: 200 mg via ORAL
  Filled 2022-02-07: qty 1

## 2022-02-07 MED ORDER — ONDANSETRON HCL 4 MG/2ML IJ SOLN
INTRAMUSCULAR | Status: DC | PRN
  Administered 2022-02-07: 4 mg via INTRAVENOUS

## 2022-02-07 MED ORDER — BUPIVACAINE LIPOSOME 1.3 % IJ SUSP: 20.0000 mL | Freq: Once | INTRAMUSCULAR | Status: DC

## 2022-02-07 MED ORDER — STERILE WATER FOR INJECTION IJ SOLN
INTRAMUSCULAR | Status: DC | PRN
  Administered 2022-02-07: 15 mL via INTRAVENOUS

## 2022-02-07 MED ORDER — ONDANSETRON HCL 4 MG/2ML IJ SOLN: 4.0000 mg | Freq: Four times a day (QID) | INTRAMUSCULAR | Status: DC | PRN

## 2022-02-07 MED ORDER — EPHEDRINE SULFATE-NACL 50-0.9 MG/10ML-% IV SOSY
PREFILLED_SYRINGE | INTRAVENOUS | Status: DC | PRN
  Administered 2022-02-07: 5 mg via INTRAVENOUS

## 2022-02-07 MED ORDER — ALVIMOPAN 12 MG PO CAPS
12.0000 mg | ORAL_CAPSULE | Freq: Two times a day (BID) | ORAL | Status: DC
  Administered 2022-02-08: 12 mg via ORAL
  Filled 2022-02-07: qty 1

## 2022-02-07 MED ORDER — ENSURE PRE-SURGERY PO LIQD: 296.0000 mL | Freq: Once | ORAL | Status: DC

## 2022-02-07 MED ORDER — ENOXAPARIN SODIUM 40 MG/0.4ML IJ SOSY
40.0000 mg | PREFILLED_SYRINGE | INTRAMUSCULAR | Status: DC
  Administered 2022-02-08 – 2022-02-11 (×4): 40 mg via SUBCUTANEOUS
  Filled 2022-02-07 (×4): qty 0.4

## 2022-02-07 MED ORDER — ATORVASTATIN CALCIUM 40 MG PO TABS
40.0000 mg | ORAL_TABLET | Freq: Every day | ORAL | Status: DC
  Administered 2022-02-07 – 2022-02-10 (×4): 40 mg via ORAL
  Filled 2022-02-07 (×4): qty 1

## 2022-02-07 MED ORDER — PHENYLEPHRINE 80 MCG/ML (10ML) SYRINGE FOR IV PUSH (FOR BLOOD PRESSURE SUPPORT)
PREFILLED_SYRINGE | INTRAVENOUS | Status: DC | PRN
  Administered 2022-02-07: 200 ug via INTRAVENOUS
  Administered 2022-02-07: 50 ug via INTRAVENOUS

## 2022-02-07 MED ORDER — TRAMADOL HCL 50 MG PO TABS
50.0000 mg | ORAL_TABLET | Freq: Four times a day (QID) | ORAL | Status: DC | PRN
  Administered 2022-02-07: 50 mg via ORAL
  Administered 2022-02-08: 100 mg via ORAL
  Administered 2022-02-08: 50 mg via ORAL
  Administered 2022-02-08 – 2022-02-09 (×3): 100 mg via ORAL
  Filled 2022-02-07 (×2): qty 2
  Filled 2022-02-07 (×2): qty 1
  Filled 2022-02-07 (×3): qty 2

## 2022-02-07 MED ORDER — ORAL CARE MOUTH RINSE: 15.0000 mL | Freq: Once | OROMUCOSAL | Status: AC

## 2022-02-07 MED ORDER — MIDAZOLAM HCL 2 MG/2ML IJ SOLN
INTRAMUSCULAR | Status: AC
  Filled 2022-02-07: qty 2

## 2022-02-07 MED ORDER — KETAMINE HCL 10 MG/ML IJ SOLN
INTRAMUSCULAR | Status: AC
  Filled 2022-02-07: qty 1

## 2022-02-07 MED ORDER — ENSURE SURGERY PO LIQD
237.0000 mL | Freq: Two times a day (BID) | ORAL | Status: DC
  Administered 2022-02-11: 237 mL via ORAL
  Filled 2022-02-07 (×3): qty 237

## 2022-02-07 MED ORDER — BUPIVACAINE-EPINEPHRINE (PF) 0.25% -1:200000 IJ SOLN
INTRAMUSCULAR | Status: AC
  Filled 2022-02-07: qty 60

## 2022-02-07 MED ORDER — ROCURONIUM BROMIDE 10 MG/ML (PF) SYRINGE
PREFILLED_SYRINGE | INTRAVENOUS | Status: AC
  Filled 2022-02-07: qty 10

## 2022-02-07 MED ORDER — ENSURE PRE-SURGERY PO LIQD: 592.0000 mL | Freq: Once | ORAL | Status: DC

## 2022-02-07 MED ORDER — GABAPENTIN 300 MG PO CAPS
300.0000 mg | ORAL_CAPSULE | ORAL | Status: AC
  Administered 2022-02-07: 300 mg via ORAL
  Filled 2022-02-07: qty 1

## 2022-02-07 MED ORDER — PHENYLEPHRINE 80 MCG/ML (10ML) SYRINGE FOR IV PUSH (FOR BLOOD PRESSURE SUPPORT)
PREFILLED_SYRINGE | INTRAVENOUS | Status: AC
  Filled 2022-02-07: qty 10

## 2022-02-07 MED ORDER — POLYVINYL ALCOHOL 1.4 % OP SOLN
2.0000 [drp] | Freq: Three times a day (TID) | OPHTHALMIC | Status: DC | PRN
  Filled 2022-02-07: qty 15

## 2022-02-07 MED ORDER — ALBUMIN HUMAN 5 % IV SOLN: 12.5000 g | Freq: Four times a day (QID) | INTRAVENOUS | Status: AC | PRN

## 2022-02-07 MED ORDER — METHOCARBAMOL 500 MG PO TABS
1000.0000 mg | ORAL_TABLET | Freq: Four times a day (QID) | ORAL | Status: DC | PRN
  Administered 2022-02-08 – 2022-02-11 (×6): 1000 mg via ORAL
  Filled 2022-02-07 (×6): qty 2

## 2022-02-07 MED ORDER — AZELASTINE HCL 0.1 % NA SOLN
1.0000 | Freq: Every day | NASAL | Status: DC | PRN
  Filled 2022-02-07: qty 30

## 2022-02-07 MED ORDER — ALBUTEROL SULFATE (2.5 MG/3ML) 0.083% IN NEBU: 2.5000 mg | INHALATION_SOLUTION | Freq: Once | RESPIRATORY_TRACT | Status: AC | PRN

## 2022-02-07 MED ORDER — FLUTICASONE FUROATE-VILANTEROL 100-25 MCG/ACT IN AEPB
1.0000 | INHALATION_SPRAY | Freq: Every day | RESPIRATORY_TRACT | Status: DC
  Filled 2022-02-07: qty 28

## 2022-02-07 MED ORDER — SIMETHICONE 80 MG PO CHEW
40.0000 mg | CHEWABLE_TABLET | Freq: Four times a day (QID) | ORAL | Status: DC | PRN
  Administered 2022-02-07 – 2022-02-08 (×3): 40 mg via ORAL
  Filled 2022-02-07 (×4): qty 1

## 2022-02-07 MED ORDER — ESCITALOPRAM OXALATE 20 MG PO TABS
20.0000 mg | ORAL_TABLET | Freq: Every day | ORAL | Status: DC
  Administered 2022-02-08 – 2022-02-11 (×4): 20 mg via ORAL
  Filled 2022-02-07 (×4): qty 1

## 2022-02-07 MED ORDER — ONDANSETRON HCL 4 MG PO TABS: 4.0000 mg | ORAL_TABLET | Freq: Four times a day (QID) | ORAL | Status: DC | PRN

## 2022-02-07 MED ORDER — DEXAMETHASONE SODIUM PHOSPHATE 10 MG/ML IJ SOLN
INTRAMUSCULAR | Status: DC | PRN
  Administered 2022-02-07: 10 mg via INTRAVENOUS

## 2022-02-07 MED ORDER — ALPRAZOLAM 0.25 MG PO TABS: 0.2500 mg | ORAL_TABLET | Freq: Three times a day (TID) | ORAL | Status: DC | PRN

## 2022-02-07 MED ORDER — PROCHLORPERAZINE MALEATE 10 MG PO TABS: 10.0000 mg | ORAL_TABLET | Freq: Four times a day (QID) | ORAL | Status: DC | PRN

## 2022-02-07 MED ORDER — FENTANYL CITRATE (PF) 250 MCG/5ML IJ SOLN
INTRAMUSCULAR | Status: DC | PRN
  Administered 2022-02-07 (×3): 50 ug via INTRAVENOUS

## 2022-02-07 MED ORDER — UMECLIDINIUM BROMIDE 62.5 MCG/ACT IN AEPB
1.0000 | INHALATION_SPRAY | Freq: Every day | RESPIRATORY_TRACT | Status: DC
  Filled 2022-02-07: qty 7

## 2022-02-07 MED ORDER — ENOXAPARIN SODIUM 40 MG/0.4ML IJ SOSY
40.0000 mg | PREFILLED_SYRINGE | Freq: Once | INTRAMUSCULAR | Status: AC
  Administered 2022-02-07: 40 mg via SUBCUTANEOUS
  Filled 2022-02-07: qty 0.4

## 2022-02-07 MED ORDER — POLYETHYLENE GLYCOL 3350 17 GM/SCOOP PO POWD: 1.0000 | Freq: Once | ORAL | Status: DC

## 2022-02-07 MED ORDER — METRONIDAZOLE 500 MG PO TABS: 1000.0000 mg | ORAL_TABLET | ORAL | Status: DC

## 2022-02-07 MED ORDER — DEXAMETHASONE SODIUM PHOSPHATE 10 MG/ML IJ SOLN
INTRAMUSCULAR | Status: AC
  Filled 2022-02-07: qty 1

## 2022-02-07 MED ORDER — BUDESONIDE 0.25 MG/2ML IN SUSP: 0.2500 mg | Freq: Two times a day (BID) | RESPIRATORY_TRACT | Status: DC

## 2022-02-07 MED ORDER — CALCIUM POLYCARBOPHIL 625 MG PO TABS
625.0000 mg | ORAL_TABLET | Freq: Two times a day (BID) | ORAL | Status: DC
  Administered 2022-02-07 – 2022-02-11 (×8): 625 mg via ORAL
  Filled 2022-02-07 (×8): qty 1

## 2022-02-07 MED ORDER — ALVIMOPAN 12 MG PO CAPS
12.0000 mg | ORAL_CAPSULE | ORAL | Status: AC
  Administered 2022-02-07: 12 mg via ORAL
  Filled 2022-02-07: qty 1

## 2022-02-07 MED ORDER — CHLORHEXIDINE GLUCONATE CLOTH 2 % EX PADS
6.0000 | MEDICATED_PAD | Freq: Every day | CUTANEOUS | Status: DC
  Administered 2022-02-07 – 2022-02-10 (×3): 6 via TOPICAL

## 2022-02-07 MED ORDER — LACTATED RINGERS IR SOLN
Status: DC | PRN
  Administered 2022-02-07: 1000 mL

## 2022-02-07 MED ORDER — SODIUM CHLORIDE (PF) 0.9 % IJ SOLN
INTRAMUSCULAR | Status: AC
  Filled 2022-02-07: qty 10

## 2022-02-07 MED ORDER — ACETAMINOPHEN 500 MG PO TABS
1000.0000 mg | ORAL_TABLET | ORAL | Status: AC
  Administered 2022-02-07: 1000 mg via ORAL
  Filled 2022-02-07: qty 2

## 2022-02-07 MED ORDER — FENTANYL CITRATE (PF) 250 MCG/5ML IJ SOLN
INTRAMUSCULAR | Status: AC
  Filled 2022-02-07: qty 5

## 2022-02-07 MED ORDER — SUGAMMADEX SODIUM 200 MG/2ML IV SOLN
INTRAVENOUS | Status: DC | PRN
  Administered 2022-02-07: 50 mg via INTRAVENOUS
  Administered 2022-02-07: 200 mg via INTRAVENOUS

## 2022-02-07 MED ORDER — ALBUMIN HUMAN 5 % IV SOLN: INTRAVENOUS | Status: DC | PRN

## 2022-02-07 MED ORDER — SODIUM CHLORIDE 0.9 % IV SOLN: Freq: Three times a day (TID) | INTRAVENOUS | Status: DC | PRN

## 2022-02-07 MED ORDER — LIDOCAINE HCL (PF) 2 % IJ SOLN
INTRAMUSCULAR | Status: AC
  Filled 2022-02-07: qty 5

## 2022-02-07 MED ORDER — TRAZODONE HCL 50 MG PO TABS
50.0000 mg | ORAL_TABLET | Freq: Every evening | ORAL | Status: DC | PRN
  Administered 2022-02-08 – 2022-02-09 (×2): 50 mg via ORAL
  Filled 2022-02-07 (×3): qty 1

## 2022-02-07 MED ORDER — BUPIVACAINE-EPINEPHRINE (PF) 0.25% -1:200000 IJ SOLN
INTRAMUSCULAR | Status: DC | PRN
  Administered 2022-02-07: 60 mL

## 2022-02-07 MED ORDER — ENALAPRILAT 1.25 MG/ML IV SOLN: 0.6250 mg | Freq: Four times a day (QID) | INTRAVENOUS | Status: DC | PRN

## 2022-02-07 MED ORDER — PROCHLORPERAZINE EDISYLATE 10 MG/2ML IJ SOLN: 5.0000 mg | Freq: Four times a day (QID) | INTRAMUSCULAR | Status: DC | PRN

## 2022-02-07 MED ORDER — FLUTICASONE PROPIONATE 50 MCG/ACT NA SUSP
1.0000 | Freq: Every day | NASAL | Status: DC | PRN
  Filled 2022-02-07: qty 16

## 2022-02-07 MED ORDER — BISACODYL 5 MG PO TBEC: 20.0000 mg | DELAYED_RELEASE_TABLET | Freq: Once | ORAL | Status: DC

## 2022-02-07 MED ORDER — PROPOFOL 10 MG/ML IV BOLUS
INTRAVENOUS | Status: DC | PRN
  Administered 2022-02-07: 130 mg via INTRAVENOUS

## 2022-02-07 MED ORDER — PANTOPRAZOLE SODIUM 40 MG PO TBEC
40.0000 mg | DELAYED_RELEASE_TABLET | Freq: Every day | ORAL | Status: DC
  Administered 2022-02-07 – 2022-02-11 (×5): 40 mg via ORAL
  Filled 2022-02-07 (×5): qty 1

## 2022-02-07 MED ORDER — DIPHENHYDRAMINE HCL 12.5 MG/5ML PO ELIX
12.5000 mg | ORAL_SOLUTION | Freq: Four times a day (QID) | ORAL | Status: DC | PRN
  Filled 2022-02-07: qty 5

## 2022-02-07 MED ORDER — STERILE WATER FOR INJECTION IJ SOLN
INTRAMUSCULAR | Status: AC
  Filled 2022-02-07: qty 10

## 2022-02-07 MED ORDER — LIDOCAINE HCL 2 % IJ SOLN
INTRAMUSCULAR | Status: AC
  Filled 2022-02-07: qty 20

## 2022-02-07 MED ORDER — ROCURONIUM BROMIDE 10 MG/ML (PF) SYRINGE
PREFILLED_SYRINGE | INTRAVENOUS | Status: DC | PRN
  Administered 2022-02-07: 10 mg via INTRAVENOUS
  Administered 2022-02-07: 60 mg via INTRAVENOUS
  Administered 2022-02-07: 20 mg via INTRAVENOUS

## 2022-02-07 MED ORDER — ALBUTEROL SULFATE (2.5 MG/3ML) 0.083% IN NEBU
INHALATION_SOLUTION | RESPIRATORY_TRACT | Status: AC
  Filled 2022-02-07: qty 3

## 2022-02-07 MED ORDER — ONDANSETRON HCL 4 MG/2ML IJ SOLN
INTRAMUSCULAR | Status: AC
  Filled 2022-02-07: qty 2

## 2022-02-07 MED ORDER — METOPROLOL TARTRATE 5 MG/5ML IV SOLN: 5.0000 mg | Freq: Four times a day (QID) | INTRAVENOUS | Status: DC | PRN

## 2022-02-07 MED ORDER — OXYBUTYNIN CHLORIDE 5 MG PO TABS
5.0000 mg | ORAL_TABLET | Freq: Every day | ORAL | Status: DC
  Administered 2022-02-08 – 2022-02-11 (×4): 5 mg via ORAL
  Filled 2022-02-07 (×4): qty 1

## 2022-02-07 MED ORDER — SODIUM CHLORIDE 0.9% FLUSH: 3.0000 mL | INTRAVENOUS | Status: DC | PRN

## 2022-02-07 MED ORDER — EPHEDRINE 5 MG/ML INJ
INTRAVENOUS | Status: AC
  Filled 2022-02-07: qty 5

## 2022-02-07 MED ORDER — ALBUTEROL SULFATE HFA 108 (90 BASE) MCG/ACT IN AERS
INHALATION_SPRAY | RESPIRATORY_TRACT | Status: AC
  Filled 2022-02-07: qty 6.7

## 2022-02-07 MED ORDER — LORATADINE 10 MG PO TABS
10.0000 mg | ORAL_TABLET | Freq: Every day | ORAL | Status: DC
  Administered 2022-02-08 – 2022-02-11 (×4): 10 mg via ORAL
  Filled 2022-02-07 (×4): qty 1

## 2022-02-07 MED ORDER — DILTIAZEM HCL 30 MG PO TABS: 30.0000 mg | ORAL_TABLET | Freq: Three times a day (TID) | ORAL | Status: DC

## 2022-02-07 MED ORDER — MAGIC MOUTHWASH: 15.0000 mL | Freq: Four times a day (QID) | ORAL | Status: DC | PRN

## 2022-02-07 MED ORDER — METHOCARBAMOL 1000 MG/10ML IJ SOLN
1000.0000 mg | Freq: Four times a day (QID) | INTRAVENOUS | Status: DC | PRN
  Filled 2022-02-07: qty 10

## 2022-02-07 MED ORDER — PROPOFOL 10 MG/ML IV BOLUS
INTRAVENOUS | Status: AC
  Filled 2022-02-07: qty 20

## 2022-02-07 MED ORDER — 0.9 % SODIUM CHLORIDE (POUR BTL) OPTIME
TOPICAL | Status: DC | PRN
  Administered 2022-02-07: 2000 mL
  Administered 2022-02-07: 1000 mL

## 2022-02-07 MED ORDER — ALBUTEROL SULFATE (2.5 MG/3ML) 0.083% IN NEBU: 3.0000 mL | INHALATION_SOLUTION | Freq: Four times a day (QID) | RESPIRATORY_TRACT | Status: DC | PRN

## 2022-02-07 MED ORDER — CLONAZEPAM 0.5 MG PO TABS: 0.5000 mg | ORAL_TABLET | Freq: Every evening | ORAL | Status: DC | PRN

## 2022-02-07 MED ORDER — VALACYCLOVIR HCL 500 MG PO TABS
500.0000 mg | ORAL_TABLET | Freq: Every day | ORAL | Status: DC
  Administered 2022-02-08 – 2022-02-11 (×4): 500 mg via ORAL
  Filled 2022-02-07 (×4): qty 1

## 2022-02-07 MED ORDER — ALUM & MAG HYDROXIDE-SIMETH 200-200-20 MG/5ML PO SUSP: 30.0000 mL | Freq: Four times a day (QID) | ORAL | Status: DC | PRN

## 2022-02-07 MED ORDER — ALBUMIN HUMAN 5 % IV SOLN
INTRAVENOUS | Status: AC
  Filled 2022-02-07: qty 250

## 2022-02-07 MED ORDER — MELATONIN 3 MG PO TABS: 3.0000 mg | ORAL_TABLET | Freq: Every evening | ORAL | Status: DC | PRN

## 2022-02-07 MED ORDER — LACTATED RINGERS IV SOLN: INTRAVENOUS | Status: DC | PRN

## 2022-02-07 MED ORDER — KETAMINE HCL 10 MG/ML IJ SOLN
INTRAMUSCULAR | Status: DC | PRN
  Administered 2022-02-07: 20 mg via INTRAVENOUS
  Administered 2022-02-07: 10 mg via INTRAVENOUS

## 2022-02-07 MED ORDER — SPY AGENT GREEN - (INDOCYANINE FOR INJECTION)
INTRAMUSCULAR | Status: DC | PRN
  Administered 2022-02-07: 4 mL via INTRAVENOUS

## 2022-02-07 MED ORDER — LIDOCAINE 2% (20 MG/ML) 5 ML SYRINGE
INTRAMUSCULAR | Status: DC | PRN
  Administered 2022-02-07: 1.5 mg/kg/h via INTRAVENOUS
  Administered 2022-02-07: 60 mg via INTRAVENOUS

## 2022-02-07 MED ORDER — ARFORMOTEROL TARTRATE 15 MCG/2ML IN NEBU: 15.0000 ug | INHALATION_SOLUTION | Freq: Two times a day (BID) | RESPIRATORY_TRACT | Status: DC

## 2022-02-07 MED ORDER — UMECLIDINIUM BROMIDE 62.5 MCG/ACT IN AEPB
1.0000 | INHALATION_SPRAY | Freq: Every day | RESPIRATORY_TRACT | Status: DC
  Administered 2022-02-08 – 2022-02-11 (×3): 1 via RESPIRATORY_TRACT
  Filled 2022-02-07 (×2): qty 7

## 2022-02-07 MED ORDER — REVEFENACIN 175 MCG/3ML IN SOLN: 175.0000 ug | Freq: Every day | RESPIRATORY_TRACT | Status: DC

## 2022-02-07 MED ORDER — BUPIVACAINE LIPOSOME 1.3 % IJ SUSP
INTRAMUSCULAR | Status: DC | PRN
  Administered 2022-02-07: 20 mL

## 2022-02-07 MED ORDER — DIPHENHYDRAMINE HCL 50 MG/ML IJ SOLN: 12.5000 mg | Freq: Four times a day (QID) | INTRAMUSCULAR | Status: DC | PRN

## 2022-02-07 MED ORDER — ACETAMINOPHEN 500 MG PO TABS: 1000.0000 mg | ORAL_TABLET | Freq: Four times a day (QID) | ORAL | Status: DC

## 2022-02-07 SURGICAL SUPPLY — 128 items
APL PRP STRL LF DISP 70% ISPRP (MISCELLANEOUS)
APPLIER CLIP 5 13 M/L LIGAMAX5 (MISCELLANEOUS)
APPLIER CLIP ROT 10 11.4 M/L (STAPLE)
APR CLP MED LRG 11.4X10 (STAPLE)
APR CLP MED LRG 5 ANG JAW (MISCELLANEOUS)
BAG COUNTER SPONGE SURGICOUNT (BAG) ×3 IMPLANT
BAG SPNG CNTER NS LX DISP (BAG)
BAG URO CATCHER STRL LF (MISCELLANEOUS) ×3 IMPLANT
BLADE EXTENDED COATED 6.5IN (ELECTRODE) IMPLANT
CANNULA REDUC XI 12-8 STAPL (CANNULA) ×2
CANNULA REDUCER 12-8 DVNC XI (CANNULA) IMPLANT
CELLS DAT CNTRL 66122 CELL SVR (MISCELLANEOUS) IMPLANT
CHLORAPREP W/TINT 26 (MISCELLANEOUS) IMPLANT
CLIP APPLIE 5 13 M/L LIGAMAX5 (MISCELLANEOUS) IMPLANT
CLIP APPLIE ROT 10 11.4 M/L (STAPLE) IMPLANT
CLOTH BEACON ORANGE TIMEOUT ST (SAFETY) ×3 IMPLANT
COVER SURGICAL LIGHT HANDLE (MISCELLANEOUS) ×6 IMPLANT
COVER TIP SHEARS 8 DVNC (MISCELLANEOUS) ×3 IMPLANT
COVER TIP SHEARS 8MM DA VINCI (MISCELLANEOUS) ×2
DEVICE TROCAR PUNCTURE CLOSURE (ENDOMECHANICALS) IMPLANT
DRAIN CHANNEL 19F RND (DRAIN) IMPLANT
DRAPE ARM DVNC X/XI (DISPOSABLE) ×12 IMPLANT
DRAPE COLUMN DVNC XI (DISPOSABLE) ×3 IMPLANT
DRAPE DA VINCI XI ARM (DISPOSABLE) ×8
DRAPE DA VINCI XI COLUMN (DISPOSABLE) ×2
DRAPE SURG IRRIG POUCH 19X23 (DRAPES) ×3 IMPLANT
DRSG OPSITE POSTOP 4X10 (GAUZE/BANDAGES/DRESSINGS) IMPLANT
DRSG OPSITE POSTOP 4X6 (GAUZE/BANDAGES/DRESSINGS) IMPLANT
DRSG OPSITE POSTOP 4X8 (GAUZE/BANDAGES/DRESSINGS) IMPLANT
DRSG TEGADERM 2-3/8X2-3/4 SM (GAUZE/BANDAGES/DRESSINGS) ×15 IMPLANT
DRSG TEGADERM 4X4.75 (GAUZE/BANDAGES/DRESSINGS) IMPLANT
ELECT PENCIL ROCKER SW 15FT (MISCELLANEOUS) ×3 IMPLANT
ELECT REM PT RETURN 15FT ADLT (MISCELLANEOUS) ×3 IMPLANT
ENDOLOOP SUT PDS II  0 18 (SUTURE)
ENDOLOOP SUT PDS II 0 18 (SUTURE) IMPLANT
EVACUATOR SILICONE 100CC (DRAIN) IMPLANT
GAUZE SPONGE 2X2 8PLY STRL LF (GAUZE/BANDAGES/DRESSINGS) ×3 IMPLANT
GLOVE ECLIPSE 8.0 STRL XLNG CF (GLOVE) ×9 IMPLANT
GLOVE INDICATOR 8.0 STRL GRN (GLOVE) ×9 IMPLANT
GLOVE SURG LX STRL 7.5 STRW (GLOVE) ×3 IMPLANT
GOWN SRG XL LVL 4 BRTHBL STRL (GOWNS) ×3 IMPLANT
GOWN STRL NON-REIN XL LVL4 (GOWNS) ×2
GOWN STRL REUS W/ TWL XL LVL3 (GOWN DISPOSABLE) ×15 IMPLANT
GOWN STRL REUS W/TWL XL LVL3 (GOWN DISPOSABLE) ×10
GRASPER SUT TROCAR 14GX15 (MISCELLANEOUS) IMPLANT
HOLDER FOLEY CATH W/STRAP (MISCELLANEOUS) ×3 IMPLANT
IRRIG SUCT STRYKERFLOW 2 WTIP (MISCELLANEOUS) ×2
IRRIGATION SUCT STRKRFLW 2 WTP (MISCELLANEOUS) ×3 IMPLANT
KIT PROCEDURE DA VINCI SI (MISCELLANEOUS) ×2
KIT PROCEDURE DVNC SI (MISCELLANEOUS) IMPLANT
KIT SIGMOIDOSCOPE (SET/KITS/TRAYS/PACK) IMPLANT
KIT TURNOVER KIT A (KITS) IMPLANT
MANIFOLD NEPTUNE II (INSTRUMENTS) ×3 IMPLANT
NDL ASPIRATION 22 (NEEDLE) ×3 IMPLANT
NDL INSUFFLATION 14GA 120MM (NEEDLE) ×3 IMPLANT
NDL SAFETY ECLIP 18X1.5 (MISCELLANEOUS) IMPLANT
NEEDLE ASPIRATION 22 (NEEDLE) IMPLANT
NEEDLE INSUFFLATION 14GA 120MM (NEEDLE) ×2 IMPLANT
PACK CARDIOVASCULAR III (CUSTOM PROCEDURE TRAY) ×3 IMPLANT
PACK COLON (CUSTOM PROCEDURE TRAY) ×3 IMPLANT
PACK CYSTO (CUSTOM PROCEDURE TRAY) ×3 IMPLANT
PAD POSITIONING PINK XL (MISCELLANEOUS) ×3 IMPLANT
PENCIL SMOKE EVACUATOR (MISCELLANEOUS) IMPLANT
PROTECTOR NERVE ULNAR (MISCELLANEOUS) ×6 IMPLANT
RELOAD STAPLE 45 3.5 BLU DVNC (STAPLE) IMPLANT
RELOAD STAPLE 45 4.3 GRN DVNC (STAPLE) IMPLANT
RELOAD STAPLE 60 3.5 BLU DVNC (STAPLE) IMPLANT
RELOAD STAPLE 60 4.3 GRN DVNC (STAPLE) IMPLANT
RELOAD STAPLER 3.5X45 BLU DVNC (STAPLE) IMPLANT
RELOAD STAPLER 3.5X60 BLU DVNC (STAPLE) IMPLANT
RELOAD STAPLER 4.3X45 GRN DVNC (STAPLE) IMPLANT
RELOAD STAPLER 4.3X60 GRN DVNC (STAPLE) ×2 IMPLANT
RETRACTOR WND ALEXIS 18 MED (MISCELLANEOUS) IMPLANT
RTRCTR WOUND ALEXIS 18CM MED (MISCELLANEOUS)
SCISSORS LAP 5X35 DISP (ENDOMECHANICALS) ×3 IMPLANT
SEAL CANN UNIV 5-8 DVNC XI (MISCELLANEOUS) ×9 IMPLANT
SEAL XI 5MM-8MM UNIVERSAL (MISCELLANEOUS) ×8
SEALER VESSEL DA VINCI XI (MISCELLANEOUS) ×2
SEALER VESSEL EXT DVNC XI (MISCELLANEOUS) ×3 IMPLANT
SOLUTION ELECTROLUBE (MISCELLANEOUS) ×3 IMPLANT
SPIKE FLUID TRANSFER (MISCELLANEOUS) ×3 IMPLANT
STAPLER 45 DA VINCI SURE FORM (STAPLE)
STAPLER 45 SUREFORM DVNC (STAPLE) IMPLANT
STAPLER 60 DA VINCI SURE FORM (STAPLE) ×2
STAPLER 60 SUREFORM DVNC (STAPLE) IMPLANT
STAPLER CANNULA SEAL DVNC XI (STAPLE) ×3 IMPLANT
STAPLER CANNULA SEAL XI (STAPLE) ×2
STAPLER ECHELON POWER CIR 29 (STAPLE) IMPLANT
STAPLER ECHELON POWER CIR 31 (STAPLE) IMPLANT
STAPLER RELOAD 3.5X45 BLU DVNC (STAPLE)
STAPLER RELOAD 3.5X45 BLUE (STAPLE)
STAPLER RELOAD 3.5X60 BLU DVNC (STAPLE)
STAPLER RELOAD 3.5X60 BLUE (STAPLE)
STAPLER RELOAD 4.3X45 GREEN (STAPLE)
STAPLER RELOAD 4.3X45 GRN DVNC (STAPLE)
STAPLER RELOAD 4.3X60 GREEN (STAPLE) ×2
STAPLER RELOAD 4.3X60 GRN DVNC (STAPLE) ×2
STOPCOCK 4 WAY LG BORE MALE ST (IV SETS) ×6 IMPLANT
SURGILUBE 2OZ TUBE FLIPTOP (MISCELLANEOUS) IMPLANT
SUT MNCRL AB 4-0 PS2 18 (SUTURE) ×3 IMPLANT
SUT PDS AB 1 CT1 27 (SUTURE) ×6 IMPLANT
SUT PROLENE 0 CT 2 (SUTURE) IMPLANT
SUT PROLENE 2 0 KS (SUTURE) IMPLANT
SUT PROLENE 2 0 SH DA (SUTURE) IMPLANT
SUT SILK 2 0 (SUTURE)
SUT SILK 2 0 SH CR/8 (SUTURE) IMPLANT
SUT SILK 2-0 18XBRD TIE 12 (SUTURE) IMPLANT
SUT SILK 3 0 (SUTURE)
SUT SILK 3 0 SH CR/8 (SUTURE) ×3 IMPLANT
SUT SILK 3-0 18XBRD TIE 12 (SUTURE) IMPLANT
SUT V-LOC BARB 180 2/0GR6 GS22 (SUTURE)
SUT VIC AB 3-0 SH 18 (SUTURE) IMPLANT
SUT VIC AB 3-0 SH 27 (SUTURE)
SUT VIC AB 3-0 SH 27XBRD (SUTURE) IMPLANT
SUT VICRYL 0 UR6 27IN ABS (SUTURE) ×3 IMPLANT
SUTURE V-LC BRB 180 2/0GR6GS22 (SUTURE) IMPLANT
SYR 10ML ECCENTRIC (SYRINGE) ×3 IMPLANT
SYR CONTROL 10ML LL (SYRINGE) IMPLANT
SYS LAPSCP GELPORT 120MM (MISCELLANEOUS)
SYS WOUND ALEXIS 18CM MED (MISCELLANEOUS) ×2
SYSTEM LAPSCP GELPORT 120MM (MISCELLANEOUS) IMPLANT
SYSTEM WOUND ALEXIS 18CM MED (MISCELLANEOUS) ×3 IMPLANT
TOWEL OR NON WOVEN STRL DISP B (DISPOSABLE) ×3 IMPLANT
TRAY FOLEY MTR SLVR 16FR STAT (SET/KITS/TRAYS/PACK) ×3 IMPLANT
TROCAR ADV FIXATION 5X100MM (TROCAR) ×3 IMPLANT
TUBING CONNECTING 10 (TUBING) ×6 IMPLANT
TUBING INSUFFLATION 10FT LAP (TUBING) ×3 IMPLANT
WATER STERILE IRR 3000ML UROMA (IV SOLUTION) ×3 IMPLANT

## 2022-02-07 NOTE — Anesthesia Procedure Notes (Signed)
Arterial Line Insertion Start/End10/09/2021 7:55 AM, 02/07/2022 7:59 AM Performed by: Maxwell Caul, CRNA, CRNA  Patient location: OR. Preanesthetic checklist: patient identified, IV checked, site marked, risks and benefits discussed, surgical consent, monitors and equipment checked, pre-op evaluation, timeout performed and anesthesia consent Lidocaine 1% used for infiltration Left, radial was placed Catheter size: 20 G Hand hygiene performed  and maximum sterile barriers used  Allen's test indicative of satisfactory collateral circulation Attempts: 2 Procedure performed without using ultrasound guided technique. Following insertion, dressing applied and Biopatch. Post procedure assessment: normal  Patient tolerated the procedure well with no immediate complications.

## 2022-02-07 NOTE — Progress Notes (Signed)
Note: Portions of this report may have been transcribed using voice recognition software. A sincere effort was made to ensure accuracy; however, inadvertent computerized transcription errors may be present.   Any transcriptional errors that result from this process are unintentional.        [name]  01-17-1956 [id number]  Patient Care Team: Carylon Perches, MD as PCP - General (Internal Medicine) Jonelle Sidle, MD as PCP - Cardiology (Cardiology) Comer, Belia Heman, MD as Consulting Physician (Infectious Diseases) Malissa Hippo, MD as Consulting Physician (Gastroenterology) Karie Soda, MD as Consulting Physician (General Surgery) Coralyn Helling, MD as Consulting Physician (Pulmonary Disease)  Patient on BiPAP.  Saturating in mid 90s on 40% FiO2.  Will follow commands.  Somewhat sleepy.  PACU nurse at bedside.  Awaiting stepdown bed availability.  Pulmonary to see soon.  Patient Active Problem List   Diagnosis Date Noted   Stricture of sigmoid colon (HCC) 02/07/2022   Gastroesophageal reflux disease 08/07/2021   Solitary pulmonary nodule 09/08/2019   Muscle weakness (generalized)    Chronic respiratory failure with hypoxia and hypercapnia (HCC) 01/21/2015   Macrocytic anemia 01/21/2015   Respiratory failure with hypercapnia (HCC) 11/14/2014   Anemia 11/14/2014   Elevated glucose 05/24/2012   COPD Group D symptoms 12/23/2011   Hyperlipidemia 12/23/2011    Past Medical History:  Diagnosis Date   Allergy    Anxiety    Arthritis    COPD (chronic obstructive pulmonary disease) (HCC)    Home oxygen at [facility]   Depression    Difficult intubation 02/07/2022   Diverticulosis    Dyspnea    Dysrhythmia    History of cardiac catheterization    No significant CAD 2005   Hyperlipidemia    Hypoglycemia    IBS (irritable bowel syndrome)    Neuromuscular disorder (HCC)    Osteoporosis    Paroxysmal SVT (supraventricular tachycardia)    Pneumonia 2015   Pre-diabetes     Shingles 2012   Sleep apnea     Past Surgical History:  Procedure Laterality Date   BACK SURGERY     BIOPSY  09/12/2021   Procedure: BIOPSY;  Surgeon: Malissa Hippo, MD;  [facility];  Service: Endoscopy;;   BREAST SURGERY     CARDIAC CATHETERIZATION  2004   COLONOSCOPY  03/22/2011   Procedure: COLONOSCOPY;  Surgeon: Malissa Hippo, MD;  [facility];  Service: Endoscopy;  Laterality: N/A;   COLONOSCOPY WITH PROPOFOL N/A 05/16/2017   Procedure: COLONOSCOPY WITH PROPOFOL;  Surgeon: Malissa Hippo, MD;  [facility];  Service: Endoscopy;  Laterality: N/A;   COLONOSCOPY WITH PROPOFOL N/A 09/12/2021   Procedure: COLONOSCOPY WITH PROPOFOL;  Surgeon: Malissa Hippo, MD;  [facility];  Service: Endoscopy;  Laterality: N/A;  1230 ASA 2   ESOPHAGEAL DILATION N/A 04/19/2020   Procedure: ESOPHAGEAL DILATION;  Surgeon: Malissa Hippo, MD;  [facility];  Service: Endoscopy;  Laterality: N/A;   ESOPHAGOGASTRODUODENOSCOPY (EGD) WITH PROPOFOL N/A 04/19/2020   Procedure: ESOPHAGOGASTRODUODENOSCOPY (EGD) WITH PROPOFOL;  Surgeon: Malissa Hippo, MD;  [facility];  Service: Endoscopy;  Laterality: N/A;  9:15   ESOPHAGOGASTRODUODENOSCOPY (EGD) WITH PROPOFOL N/A 09/12/2021   Procedure: ESOPHAGOGASTRODUODENOSCOPY (EGD) WITH PROPOFOL;  Surgeon: Malissa Hippo, MD;  [facility];  Service: Endoscopy;  Laterality: N/A;   FLEXIBLE SIGMOIDOSCOPY N/A 09/26/2017   Procedure: FLEXIBLE SIGMOIDOSCOPY with Propofol ;  Surgeon: Malissa Hippo, MD;  [facility];  Service: Endoscopy;  Laterality: N/A;  9:30   POLYPECTOMY  05/16/2017   Procedure: POLYPECTOMY;  Surgeon: Malissa Hippo, MD;  [facility];  Service: Endoscopy;;   POLYPECTOMY  09/12/2021   Procedure: POLYPECTOMY INTESTINAL;  Surgeon: Malissa Hippo, MD;  [facility];  Service: Endoscopy;;   SPINAL FUSION     VULVECTOMY       Social History   Socioeconomic History   Marital status: Divorced    Spouse name: Not on file   Number of children: Not on file   Years of education: Not on file   Highest education level: Not on file  Occupational History   Not on file  Tobacco Use   Smoking status: Former    Packs/day: 1.00    Years: 40.00    Total pack years: 40.00    Types: Cigarettes    Quit date: 05/07/2015    Years since quitting: 6.7    Passive exposure: Past   Smokeless tobacco: Never  Vaping Use   Vaping Use: Former  Substance and Sexual Activity   Alcohol use: Yes    Comment: 1 -2 glasses of wine daily   Drug use: No   Sexual activity: Never  Other Topics Concern   Not on file  Social History Narrative   Daughter is MD Judeth Cornfield Janne Napoleon, radiologist in [place])   Social Determinants of Health   Financial Resource Strain: Not on file  Food Insecurity: Not on file  Transportation Needs: Not on file  Physical Activity: Not on file  Stress: Not on file  Social Connections: Not on file  Intimate Partner Violence: Not on file    Family History  Problem Relation Age of Onset   Asthma Mother    Heart attack Mother    Diabetes Mother    Heart failure Mother    Colon cancer Neg Hx     Current Facility-Administered Medications  Medication Dose Route Frequency Provider Last Rate Last Admin   0.9 % irrigation (POUR BTL)    PRN Karie Soda, MD   2,000 mL at 02/07/22 1008   bupivacaine liposome (EXPAREL) 1.3 % injection 266 mg  20 mL Infiltration Once Blayne Frankie, Viviann Spare, MD       bupivacaine liposome (EXPAREL) 1.3 % injection    PRN Karie Soda, MD   20 mL at 02/07/22 1040   bupivacaine-epinephrine (PF) (MARCAINE W/ EPI) 0.25% -1:200000 injection    PRN Karie Soda, MD   60 mL at 02/07/22 1040   [START ON 02/08/2022] feeding supplement (ENSURE PRE-SURGERY) liquid 296 mL  296 mL Oral Once Karie Soda, MD       feeding supplement (ENSURE PRE-SURGERY) liquid 592 mL  592 mL Oral  Once Karie Soda, MD       lactated ringers infusion   Intravenous Continuous Jairo Ben, MD 10 mL/hr at 02/07/22 0548 New Bag at 02/07/22 1047   lactated ringers irrigation solution    PRN Cheral Cappucci, Viviann Spare, MD   1,000 mL at 02/07/22 1008   sterile water (preservative free) 20 mL with SPY AGENT GREEN - (INDOCYANINE FOR INJECTION) 25 mg    PRN Karie Soda, MD   15 mL at 02/07/22 0805     Allergies  Allergen Reactions   Iohexol Anaphylaxis and Other (See Comments)     Desc: IV DYE  ANAPHYLATIC SHOCK X2 ONCE AFTER PREMEDS    Adhesive [Tape] Itching and Other (See Comments)    Break  out   Ketek [Telithromycin] Other (See Comments)    Loopy, eyes went different directions   Penicillins Other (See Comments)    Unknown Has patient had a PCN reaction causing immediate rash, facial/tongue/throat swelling, SOB or lightheadedness with hypotension: Unknown Has patient had a PCN reaction causing severe rash involving mucus membranes or skin necrosis: Unknown Has patient had a PCN reaction that required hospitalization: Unknown Has patient had a PCN reaction occurring within the last 10 years: No If all of the above answers are "NO", then may proceed with Cephalosporin use.   Chantix [Varenicline] Palpitations    BP 137/62 (BP Location: Right Arm)   Pulse 79   Temp 98.3 F (36.8 C)   Resp 18   Wt 61.2 kg   SpO2 96%   BMI 25.51 kg/m   No results found.

## 2022-02-07 NOTE — Op Note (Signed)
02/07/2022  11:19 AM  PATIENT:  [name]  66 y.o. female  Patient Care Team: Asencion Noble, MD as PCP - General (Internal Medicine) Satira Sark, MD as PCP - Cardiology (Cardiology) Comer, Okey Regal, MD as Consulting Physician (Infectious Diseases) Rogene Houston, MD as Consulting Physician (Gastroenterology) Michael Boston, MD as Consulting Physician (General Surgery) Chesley Mires, MD as Consulting Physician (Pulmonary Disease)  PRE-OPERATIVE DIAGNOSIS:  STRICTURE OF COLON  POST-OPERATIVE DIAGNOSIS:  STRICTURE OF COLON  PROCEDURE:   ROBOTIC LOW ANTERIOR RECTOSIGMOID RESECTION LEFT SALPINGO OOPHORECTOMY TRANSVERSUS ABDOMINIS PLANE (TAP) BLOCK - BILATERAL INTRAOPERATIVE ASSESSMENT OF PERFUSION USING FIREFLY RIGID PROCTOSCOPY  SURGEON:  Adin Hector, MD  ASSISTANT: Nadeen Landau, MD Fontaine No, PA-S, [facility]  ANESTHESIA:     General  Regional TRANSVERSUS ABDOMINIS PLANE (TAP) nerve block for perioperative & postoperative pain control provided with liposomal bupivacaine (Experel) mixed with 0.25% bupivacaine as a Bilateral TAP block x 60m each side at the level of the transverse abdominis & preperitoneal spaces along the flank at the anterior axillary line, from subcostal ridge to iliac crest under laparoscopic guidance   Local field block at port sites & extraction wound  EBL:  Total I/O In: 1250 [I.V.:1000; IV Piggyback:250] Out: 300 [Urine:200; Blood:100]  Delay start of Pharmacological VTE agent (>24hrs) due to surgical blood loss or risk of bleeding:  no  DRAINS: 19 Fr Blake drain goes to the pelvis  SPECIMEN:   RECTOSIGMOID COLON (open end proximal) DISTAL ANASTOMOTIC RING (final distal margin)  DISPOSITION OF SPECIMEN:  PATHOLOGY  COUNTS:  YES  PLAN OF CARE: Admit to inpatient   PATIENT DISPOSITION:   PACU -extubated on facemask.  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  Biopsies benign.  Diverticular etiology suspected.  Underwent cardiology and pulmonary consultations.  I recommended segmental resection:  The anatomy & physiology of the digestive tract was discussed.  The pathophysiology was discussed.  Natural history risks without surgery was discussed.   I worked to give an overview of the disease and the frequent need to have multispecialty involvement.  I feel the risks of no intervention will lead to serious problems that outweigh the operative risks; therefore, I recommended a partial colectomy to remove the pathology.  Laparoscopic & open techniques were discussed.   Risks such as bleeding, infection, abscess, leak, reoperation, possible ostomy, hernia, heart attack, death, and other risks were discussed.  I noted a good likelihood this will help address the problem.   Goals of post-operative recovery were discussed as well.  We will work to minimize complications.  Educational materials on the pathology had been given in the office.  Questions were answered.    The patient expressed understanding & wished to proceed with surgery.  OR FINDINGS:   Patient had thickened distal sigmoid/rectosigmoid region.  Dense adhesions and involvement with left adnexa.  En bloc salpingo-oophorectomy done.  No obvious metastatic disease on visceral parietal peritoneum or liver.  The anastomosis rests 11 cm from the anal verge by rigid proctoscopy.  It is a 265EEA distal descending to proximal rectum anastomosis  CASE DATA:  Type of patient?: Elective [facility] Private Case  Status of Case? Elective Scheduled  Infection Present At Time Of Surgery (PATOS)?  PHLEGMON  DESCRIPTION:   Informed consent was confirmed.  The patient underwent general anaesthesia without difficulty.  The patient was positioned appropriately.  VTE prevention in  place.  Patient underwent cystoscopy with firefly ICG infiltration of bladder and ureters for  identification by Dr. Dutch Gray of [facility].  Please see his separate operative note.  The patient was clipped, prepped, & draped in a sterile fashion.  Surgical timeout confirmed our plan.  The patient was positioned in reverse Trendelenburg.  Abdominal entry was gained using Varess technique at the left subcostal ridge on the anterior abdominal wall.  No elevated EtCO2 noted.  Port placed.  Camera inspection revealed no injury.  Extra ports were carefully placed under direct laparoscopic visualization.  Upon entering the abdomen (organ space), I encountered a phlegmon involving the rectosigmoid region to the left adnexa .   I reflected the greater omentum and the upper abdomen the small bowel in the upper abdomen.  The patient was carefully positioned.  The Intuitive daVinci robot was docked with camera & instruments carefully placed.  The patient had very thickened rectosigmoid colon densely adherent to the left adnexa.  Initially began to mobilize descending colon in a lateral medial fashion.  Planes were rather obliterated so I worked to find a more clean plane by taking a retromesenteric medial to lateral approach.  I elevated it to put the main pedicle on tension.  I scored the base of peritoneum of the medial side of the rectosigmoid mesentery of the elevated left colon from the ligament of Treitz to the mid rectum.   I elevated the sigmoid mesentery and entered into the retro-mesenteric plane. We were able to identify the left ureter and gonadal vessels. We kept those posterior within the retroperitoneum and elevated the left colon mesentery off that. I did isolate the inferior mesenteric artery (IMA) pedicle but did not ligate it yet.  I continued distally and got into the avascular plane posterior to the mesorectum, sparing the nervi ergentes.. This allowed me to help mobilize the rectum as well by freeing the mesorectum off the sacrum.  I stayed away from the right and left ureters.  I kept  the lateral vascular pedicles to the rectum intact.  I skeletonized the lymph nodes off the inferior mesenteric artery pedicle.  I went down to its takeoff from the aorta.   I isolated the inferior mesenteric vein off of the ligament of Treitz just cephalad to that as well.  After confirming the left ureter was out of the way, I went ahead and ligated the inferior mesenteric artery pedicle just near its takeoff from the aorta.  I did ligate the inferior mesenteric vein in a similar fashion.  We ensured hemostasis.  I continued medial to lateral dissection to free the left colon mesentery off the retroperitoneum going up towards the splenic flexure to allow good mobility and protect the colon mesentery.  I mobilized the left colon in a lateral to medial fashion off the retroperitoneum and sidewall attachments along the line of Toldt up towards the splenic flexure to ensure good mobilization of the remaining left colon to reach into the pelvis.   We then focused on mesorectal dissection.  Freed the mesorectum off the presacral plane until I was distal to the concerning region.  Freed off peritoneum on the lateral sidewalls as well and transected the mesentery of the lateral pedicles to get distal to the area of concern.  Right side was rather clean but left side had dense adherence to the adnexa.  I did not feel it could be saved or preserved.  Dr. Dema Severin agreed.  Taking care to keep the left  ureter in the retroperitoneal and lateral pelvis position I came through the ovarian vessels.  Freed the left fallopian tube and ovary off the lateral pelvic attachments and came around and clearly.  Had to come through the left round ligament as it entered into the uterus.  Without I could free the inflamed rectosigmoid colon off the uterus and dome of the bladder carefully.  Came around anteriorly such that I had good circumferential mesorectal excision and a good margin distal to the area of concern.  I chose a region at  the descending/sigmoid junction that was soft and easily reached down to the rectal stump.  I transected the mesentery of the colon radially to preserve remaining colon blood supply.  I skeletonized the mesorectum and transected through it at the proximal/mid rectal junction    To access vascular perfusion of tissues, we asked anesthesia use intravenous  indocyanine green (ICG) with IV flush.  I switched to the NIR fluorescence (Firefly mode) imaging window on the daVinci robot platform.  We were able to see good light green visualization of blood vessels with good vascular perfusion of tissues, confirming good tissue perfusion of tissues (distal descending colon and mid rectum) planned for anastomosis.  Transected at the proximal/mid rectal junction using a green load 74m robotic stapler.    We created an extraction incision through a small Pfannenstiel incision in the suprapubic region.  Placed a wound protector.  I was able to eviscerate the rectosigmoid and descending colon out the wound.   I clamped the colon proximal to this area using a reusable pursestringer device.  Passed a 2-0 Keith needle. I transected at the descending/sigmoid junction with a scalpel. I got healthy bleeding mucosa.  We sent the rectosigmoid colon specimen off to go to pathology.  We sized the colon orifice.  I chose a 250mEEA anvil stapler system.  I reinforced the prolene pursestring with interrupted silk "belt loop" sutures.  I placed the anvil to the open end of the proximal remaining colon and closed around it using the pursestring.    We did copious irrigation with crystalloid solution.  Hemostasis was good.  The distal end of the remaining colon easily reached down to the rectal stump, therefore, splenic flexure mobilization was not needed.  Aspirated some clots in the pelvis.  Found a few small oozers on the rectal mesorectum that I was able to control with touch point cautery.  Washed and assured good hemostasis.      Dr  WhDema Severincrubbed down and did gentle anal dilation and advanced the EEA stapler up the rectal stump.  I did irrigation the spike was brought out at the provimal end of the rectal stump under direct visualization.  I  attached the anvil of the proximal colon the spike of the stapler. Anvil was tightened down and held clamped for 60 seconds.  Orientation was confirmed such that there is no twisting of the colon nor small bowel underneath the mesenteric defect. No concerning tension.  The EEA stapler was fired and held clamped for 30 seconds. The stapler was released & removed. Blue stitch is in the proximal ring.  Care was taken to ensure no other structures were incorporated within this either.  We noted 2 excellent anastomotic rings.   The colon proximal to the anastomosis was then gently occluded. The pelvis was filled with sterile irrigation.  Dr WhDema Severindid rigid proctoscopy noted the anastomosis was at 11 cm from the anal verge consistent with the  proximal/mid rectum.  There was a negative air leak test. There was no tension of mesentery or bowel at the anastomosis.   Tissues looked viable.  Ureters & bowel uninjured.  The anastomosis looked healthy. Greater omentum positioned down into the pelvis to help protect the anastomosis.  Endoluminal gas was evacuated.  Ports & wound protector removed.  We changed gloves & redraped the patient per colon SSI prevention protocol.  We aspirated the sterile irrigation.  Hemostasis was good.  Sterile unused instruments were used from this point.  I closed the skin at the port sites using Monocryl stitch and sterile dressing.  We assured hemostasis and the former ostomy wound.  Wound irrigated.  I closed the posterior rectus fascia with 0 Vicryl suture.  Anterior rectus fascia was closed using #1 PDS transversely.  Dermis closed with 4 Monocryl running suture.  Sterile dressing placed.   Anesthesia took extra time to safely let her awaken and be extubated on face max.  I  called and discussed with Gaylyn Lambert, nurse practitioner for pulmonary critical care since the patient had been followed by [place] [facility] and given her oxygen dependent COPD and BiPAP for sleep apnea patient is being extubated go to recovery room. I had discussed postop care with the patient in detail the office & in the holding area. Instructions are written. I discussed operative findings, updated the patient's status, discussed probable steps to recovery, and gave postoperative recommendations to the patient's daughter, Wende Bushy .  Recommendations were made.  Questions were answered.  She expressed understanding & appreciation.  Adin Hector, M.D., F.A.C.S. Gastrointestinal and Minimally Invasive Surgery [facility] [address], [place], [place] [address] [phone] Main / Paging

## 2022-02-07 NOTE — Transfer of Care (Signed)
Immediate Anesthesia Transfer of Care Note  Patient: [name]  Procedure(s) Performed: ROBOTIC RESECTION OF RECTOSIGMOID, LEFT SALPINGO OOPHORECTOMY, BILATERAL TAP BLOCK, INTRAOPERATIVE ASSESSMENT OF PERFUSION USING FIREFLY (Abdomen) POSSIBLE OSTOMY RIGID PROCTOSCOPY CYSTOSCOPY with FIREFLY INJECTION  Patient Location: PACU  Anesthesia Type:General  Level of Consciousness: drowsy  Airway & Oxygen Therapy: Patient Spontanous Breathing, Patient connected to face mask and patient placed on BiPap per RT  Post-op Assessment: Report given to RN and Post -op Vital signs reviewed and stable  Post vital signs: Reviewed and stable  Last Vitals:  Vitals Value Taken Time  BP    Temp    Pulse    Resp    SpO2      Last Pain:  Vitals:   02/07/22 0613  TempSrc:   PainSc: 0-No pain         Complications:  Encounter Notable Events  Notable Event Outcome Phase Comment  Difficult to intubate - expected  Intraprocedure Filed from anesthesia note documentation.

## 2022-02-07 NOTE — Discharge Instructions (Addendum)
SURGERY: POST OP INSTRUCTIONS (Surgery for small bowel obstruction, colon resection, etc)   ######################################################################  EAT Gradually transition to a high fiber diet with a fiber supplement over the next few days after discharge  WALK Walk an hour a day.  Control your pain to do that.    CONTROL PAIN Control pain so that you can walk, sleep, tolerate sneezing/coughing, go up/down stairs.  HAVE A BOWEL MOVEMENT DAILY Keep your bowels regular to avoid problems.  OK to try a laxative to override constipation.  OK to use an antidairrheal to slow down diarrhea.  Call if not better after 2 tries  CALL IF YOU HAVE PROBLEMS/CONCERNS Call if you are still struggling despite following these instructions. Call if you have concerns not answered by these instructions  ######################################################################   DIET Follow a light diet the first few days at home.  Start with a bland diet such as soups, liquids, starchy foods, low fat foods, etc.  If you feel full, bloated, or constipated, stay on a ful liquid or pureed/blenderized diet for a few days until you feel better and no longer constipated. Be sure to drink plenty of fluids every day to avoid getting dehydrated (feeling dizzy, not urinating, etc.). Gradually add a fiber supplement to your diet over the next week.  Gradually get back to a regular solid diet.  Avoid fast food or heavy meals the first week as you are more likely to get nauseated. It is expected for your digestive tract to need a few months to get back to normal.  It is common for your bowel movements and stools to be irregular.  You will have occasional bloating and cramping that should eventually fade away.  Until you are eating solid food normally, off all pain medications, and back to regular activities; your bowels will not be normal. Focus on eating a low-fat, high fiber diet the rest of your life  (See Getting to [place], below).  CARE of your INCISION or WOUND  It is good for closed incisions and even open wounds to be washed every day.  Shower every day.  Short baths are fine.  Wash the incisions and wounds clean with soap & water.    You may leave closed incisions open to air if it is dry.   You may cover the incision with clean gauze & replace it after your daily shower for comfort.  TEGADERM:  You have clear gauze band-aid dressings over your closed incision(s).  Remove the dressings 3 days after surgery.    If you have an open wound with a wound vac, see wound vac care instructions.    ACTIVITIES as tolerated Start light daily activities --- self-care, walking, climbing stairs-- beginning the day after surgery.  Gradually increase activities as tolerated.  Control your pain to be active.  Stop when you are tired.  Ideally, walk several times a day, eventually an hour a day.   Most people are back to most day-to-day activities in a few weeks.  It takes 4-8 weeks to get back to unrestricted, intense activity. If you can walk 30 minutes without difficulty, it is safe to try more intense activity such as jogging, treadmill, bicycling, low-impact aerobics, swimming, etc. Save the most intensive and strenuous activity for last (Usually 4-8 weeks after surgery) such as sit-ups, heavy lifting, contact sports, etc.  Refrain from any intense heavy lifting or straining until you are off narcotics for pain control.  You will have off days, but  things should improve week-by-week. DO NOT PUSH THROUGH PAIN.  Let pain be your guide: If it hurts to do something, don't do it.  Pain is your body warning you to avoid that activity for another week until the pain goes down. You may drive when you are no longer taking narcotic prescription pain medication, you can comfortably wear a seatbelt, and you can safely make sudden turns/stops to protect yourself without hesitating due to pain. You may  have sexual intercourse when it is comfortable. If it hurts to do something, stop.  MEDICATIONS Take your usually prescribed home medications unless otherwise directed.   Blood thinners:  Usually you can restart any strong blood thinners after the second postoperative day.  It is OK to take aspirin right away.     If you are on strong blood thinners (warfarin/Coumadin, Plavix, Xerelto, Eliquis, Pradaxa, etc), discuss with your surgeon, medicine PCP, and/or cardiologist for instructions on when to restart the blood thinner & if blood monitoring is needed (PT/INR blood check, etc).     PAIN CONTROL Pain after surgery or related to activity is often due to strain/injury to muscle, tendon, nerves and/or incisions.  This pain is usually short-term and will improve in a few months.  To help speed the process of healing and to get back to regular activity more quickly, DO THE FOLLOWING THINGS TOGETHER: Increase activity gradually.  DO NOT PUSH THROUGH PAIN Use Ice and/or Heat Try Gentle Massage and/or Stretching Take over the counter pain medication Take Narcotic prescription pain medication for more severe pain  Good pain control = faster recovery.  It is better to take more medicine to be more active than to stay in bed all day to avoid medications.  Increase activity gradually Avoid heavy lifting at first, then increase to lifting as tolerated over the next 6 weeks. Do not "push through" the pain.  Listen to your body and avoid positions and maneuvers than reproduce the pain.  Wait a few days before trying something more intense Walking an hour a day is encouraged to help your body recover faster and more safely.  Start slowly and stop when getting sore.  If you can walk 30 minutes without stopping or pain, you can try more intense activity (running, jogging, aerobics, cycling, swimming, treadmill, sex, sports, weightlifting, etc.) Remember: If it hurts to do it, then don't do it! Use Ice and/or  Heat You will have swelling and bruising around the incisions.  This will take several weeks to resolve. Ice packs or heating pads (6-8 times a day, 30-60 minutes at a time) will help sooth soreness & bruising. Some people prefer to use ice alone, heat alone, or alternate between ice & heat.  Experiment and see what works best for you.  Consider trying ice for the first few days to help decrease swelling and bruising; then, switch to heat to help relax sore spots and speed recovery. Shower every day.  Short baths are fine.  It feels good!  Keep the incisions and wounds clean with soap & water.   Try Gentle Massage and/or Stretching Massage at the area of pain many times a day Stop if you feel pain - do not overdo it Take over the counter pain medication This helps the muscle and nerve tissues become less irritable and calm down faster Choose ONE of the following over-the-counter anti-inflammatory medications: Acetaminophen 500mg tabs (Tylenol) 1-2 pills with every meal and just before bedtime (avoid if you have liver problems or   if you have acetaminophen in you narcotic prescription) Naproxen 220mg tabs (ex. Aleve, Naprosyn) 1-2 pills twice a day (avoid if you have kidney, stomach, IBD, or bleeding problems) Ibuprofen 200mg tabs (ex. Advil, Motrin) 3-4 pills with every meal and just before bedtime (avoid if you have kidney, stomach, IBD, or bleeding problems) Take with food/snack several times a day as directed for at least 2 weeks to help keep pain / soreness down & more manageable. Take Narcotic prescription pain medication for more severe pain A prescription for strong pain control is often given to you upon discharge (for example: oxycodone/Percocet, hydrocodone/Norco/Vicodin, or tramadol/Ultram) Take your pain medication as prescribed. Be mindful that most narcotic prescriptions contain Tylenol (acetaminophen) as well - avoid taking too much Tylenol. If you are having problems/concerns with  the prescription medicine (does not control pain, nausea, vomiting, rash, itching, etc.), please call us [phone] to see if we need to switch you to a different pain medicine that will work better for you and/or control your side effects better. If you need a refill on your pain medication, you must call the office before 4 pm and on weekdays only.  By federal law, prescriptions for narcotics cannot be called into a pharmacy.  They must be filled out on paper & picked up from our office by the patient or authorized caretaker.  Prescriptions cannot be filled after 4 pm nor on weekends.    WHEN TO CALL US [phone] Severe uncontrolled or worsening pain  Fever over 101 F (38.5 C) Concerns with the incision: Worsening pain, redness, rash/hives, swelling, bleeding, or drainage Reactions / problems with new medications (itching, rash, hives, nausea, etc.) Nausea and/or vomiting Difficulty urinating Difficulty breathing Worsening fatigue, dizziness, lightheadedness, blurred vision Other concerns If you are not getting better after two weeks or are noticing you are getting worse, contact our office [phone] for further advice.  We may need to adjust your medications, re-evaluate you in the office, send you to the emergency room, or see what other things we can do to help. The clinic staff is available to answer your questions during regular business hours (8:30am-5pm).  Please don't hesitate to call and ask to speak to one of our nurses for clinical concerns.    A surgeon from [facility] is always on call at the hospitals 24 hours/day If you have a medical emergency, go to the nearest emergency room or call 911.  FOLLOW UP in our office One the day of your discharge from the hospital (or the next business weekday), please call [facility] to set up or confirm an appointment to see your surgeon in the office for a follow-up appointment.  Usually it is 2-3 weeks  after your surgery.   If you have skin staples at your incision(s), let the office know so we can set up a time in the office for the nurse to remove them (usually around 10 days after surgery). Make sure that you call for appointments the day of discharge (or the next business weekday) from the hospital to ensure a convenient appointment time. IF YOU HAVE DISABILITY OR FAMILY LEAVE FORMS, BRING THEM TO THE OFFICE FOR PROCESSING.  DO NOT GIVE THEM TO YOUR DOCTOR.  [facility] [address], [address], [place], [place]  [address] ? [phone] - Main [phone] - Toll Free,  [phone] - Fax [email or web address]    GETTING TO GOOD BOWEL HEALTH. It is expected for your   digestive tract to need a few months to get back to normal.  It is common for your bowel movements and stools to be irregular.  You will have occasional bloating and cramping that should eventually fade away.  Until you are eating solid food normally, off all pain medications, and back to regular activities; your bowels will not be normal.   Avoiding constipation The goal: ONE SOFT BOWEL MOVEMENT A DAY!    Drink plenty of fluids.  Choose water first. TAKE A FIBER SUPPLEMENT EVERY DAY THE REST OF YOUR LIFE During your first week back home, gradually add back a fiber supplement every day Experiment which form you can tolerate.   There are many forms such as powders, tablets, wafers, gummies, etc Psyllium bran (Metamucil), methylcellulose (Citrucel), Miralax or Glycolax, Benefiber, Flax Seed.  Adjust the dose week-by-week (1/2 dose/day to 6 doses a day) until you are moving your bowels 1-2 times a day.  Cut back the dose or try a different fiber product if it is giving you problems such as diarrhea or bloating. Sometimes a laxative is needed to help jump-start bowels if constipated until the fiber supplement can help regulate your bowels.  If you are tolerating eating & you are farting, it  is okay to try a gentle laxative such as double dose MiraLax, prune juice, or Milk of Magnesia.  Avoid using laxatives too often. Stool softeners can sometimes help counteract the constipating effects of narcotic pain medicines.  It can also cause diarrhea, so avoid using for too long. If you are still constipated despite taking fiber daily, eating solids, and a few doses of laxatives, call our office. Controlling diarrhea Try drinking liquids and eating bland foods for a few days to avoid stressing your intestines further. Avoid dairy products (especially milk & ice cream) for a short time.  The intestines often can lose the ability to digest lactose when stressed. Avoid foods that cause gassiness or bloating.  Typical foods include beans and other legumes, cabbage, broccoli, and dairy foods.  Avoid greasy, spicy, fast foods.  Every person has some sensitivity to other foods, so listen to your body and avoid those foods that trigger problems for you. Probiotics (such as active yogurt, Align, etc) may help repopulate the intestines and colon with normal bacteria and calm down a sensitive digestive tract Adding a fiber supplement gradually can help thicken stools by absorbing excess fluid and retrain the intestines to act more normally.  Slowly increase the dose over a few weeks.  Too much fiber too soon can backfire and cause cramping & bloating. It is okay to try and slow down diarrhea with a few doses of antidiarrheal medicines.   Bismuth subsalicylate (ex. Kayopectate, Pepto Bismol) for a few doses can help control diarrhea.  Avoid if pregnant.   Loperamide (Imodium) can slow down diarrhea.  Start with one tablet ('2mg'$ ) first.  Avoid if you are having fevers or severe pain.  TROUBLESHOOTING IRREGULAR BOWELS 1) Start with a soft & bland diet. No spicy, greasy, or fried foods.  2) Avoid gluten/wheat or dairy products from diet to see if symptoms improve. 3) Miralax 17gm or flax seed mixed in [place].  water or juice-daily. May use 2-4 times a day as needed. 4) Gas-X, Phazyme, etc. as needed for gas & bloating.  5) Prilosec (omeprazole) over-the-counter as needed 6)  Consider probiotics (Align, Activa, etc) to help calm the bowels down  Call your doctor if you are getting worse or not getting  better.  Sometimes further testing (cultures, endoscopy, X-ray studies, CT scans, bloodwork, etc.) may be needed to help diagnose and treat the cause of the diarrhea. [facility], [place], [place], [place], [place]  [address] [phone] - Main.    [phone]  - Toll Free.   [phone] - Fax [email or web address]    ###################   HEMORRHOIDS   Hemorrhoidal piles are natural clusters of blood vessels that help the rectum and anal canal stretch to hold stool and allow bowel movements.  Most people will develop a flare of hemorrhoids in their lifetime.  When hemorrhoidals are irritated, they can swell, burn, itch, cause pain, and bleed.  Most flares will calm down gradually within a few weeks.  However, once hemorrhoids are created, they tend to flare more easily.  Fortunately, good habits and simple medical treatment usually control hemorrhoids well, and surgery is needed only in severe cases.  TREATMENT OF HEMORRHOID FLARE Warm soaks. 4-8 times a day This helps more than any topical medication.   A sitz bath is a warm water bath taken in the sitting position that covers only the hips and buttocks.Fill the bathtub half full with warm water. Soak in the water for 15 to 30 minutes. After the sitz bath, pat the affected area dry first.  Normalize your bowels.  Extremes of diarrhea or constipation will make hemorrhoids worse.  One soft bowel movement a day is the goal.   Wet wipes instead of toilet paper Pain control with a NSAID such as ibuprofen (Advil) or naproxen (Aleve) or acetaminophen (Tylenol) around the clock.  Narcotics are constipating  and should be minimized if possible Topical creams contain steroids (bydrocortisone) or local anesthetic (xylocaine) can help make pain and itching more tolerable.    TROUBLESHOOTING IRREGULAR BOWELS 1) Avoid extremes of bowel movements (no bad constipation/diarrhea) 2) Miralax 17gm in 8oz. water or juice every day. May use twice a day.  3) Gas-x or Phazyme as needed for gas & bloating.  4) Soft & bland diet. No spicy, greasy, or fried foods.  5) Omeprazole over-the-counter as needed  6) May hold gluten/wheat products from diet to see if symptoms improve.  7)  May try probiotics (Align, Activa, etc) to help calm the bowels down 7) If symptoms become worse: Call back immediately.

## 2022-02-07 NOTE — Op Note (Signed)
Preoperative diagnosis: Stricture of sigmoid colon  Postoperative diagnosis: Stricture of sigmoid colon  Procedures: Cystoscopy with bilateral retrograde intraureteral injection of indocyanine green dye  Surgeon: Pryor Curia MD  Anesthesia: General  Complications: None  EBL: None  Specimens: None  Drains: 16 French Foley catheter  Indication: Ms. Taylor is a 66 year old female who has a stricture of the sigmoid colon and is scheduled to undergo surgical treatment today under the care of Dr. Johney Maine.  Urologic consultation was obtained for retrograde injection of intraureteral indocyanine green dye for intraoperative identification of the ureters during this complex procedure.  The potential risks, complications, and expected recovery process was discussed in detail.  Informed consent was obtained.  Description of procedure: The patient was taken the operating room and general anesthetic was administered.  She was given preoperative antibiotics, placed in the dorsolithotomy position, and prepped and draped in usual sterile fashion.  Next, a preoperative timeout was performed.  Cystourethroscopy was then performed and the bladder was systematically examined.  No bladder tumors, stones, or other mucosal abnormalities were identified.  The ureteral orifices were located in their normal anatomic positions.  A 6 French ureteral catheter was used to intubate the left ureter and 7.5 cc of indocyanine green dye was injected up the left ureter.  An identical procedure was then performed on the contralateral side.  The cystoscope was removed and a 16 French Foley catheter was placed.  The patient tolerated this portion of the procedure well without complications.  The procedure was then turned over to Dr. Johney Maine for the remainder of the procedure.

## 2022-02-07 NOTE — Consult Note (Signed)
NAME:  [name], MRN:  [id number], DOB:  [birth year], LOS: 0 ADMISSION DATE:  02/07/2022, CONSULTATION DATE:  02/07/22 REFERRING MD:  Michaell Cowing -- [facility] , CHIEF COMPLAINT:  chronic respiratory failure    History of Present Illness:  66 yo F PMH COPD, chronic hypoxic respiratory failure on 4-7L, sigmoid colon stricture who presented 10/5 for planned sigmoid colon resection. During case dense adhesions were identified involving L adnexa, prompting salpingo oophorectomy. 19Fr blake drain placed. EBL , extubated following case.  PCCM in consulted for post-operative medical management in this setting   Pertinent  Medical History   GERD Colon stricture COPD Chronic hypoxic respiratory failure  HLD Anemia   Significant Hospital Events: Including procedures, antibiotic start and stop dates in addition to other pertinent events   10/5 elective sigmoid colon resection, c/b L adnexal adhesions requiring L salpingo oophorectomy. Extubated after case and planned for admission for medical monitoring. PCCM consulted   Interim History / Subjective:  POD0 robotic sigmoid colon resection L, salpingo oophorectomy   EBL     Objective   Blood pressure 122/69, pulse 86, temperature 98.1 F (36.7 C), temperature source Oral, resp. rate 20, weight 61.2 kg, SpO2 100 %.        Intake/Output Summary (Last 24 hours) at 02/07/2022 1140 Last data filed at 02/07/2022 1121 Gross per 24 hour  Intake 2250 ml  Output 300 ml  Net 1950 ml   Filed Weights   02/07/22 0550  Weight: 61.2 kg    Examination: General: Chronically ill older adult F on BiPAP  HENT: Bipap in place with good seal NCAT. Pink mm  Lungs: Symmetrical chest expansion. No wheezing. On NPPV Cardiovascular: rrr s1s2 cap refill brisk  Abdomen: surgical dressing clean, intact. R sided JP with serosanguinous output. Abdomen is soft, distended, +bowel sounds  Extremities: no acute joint deformity Neuro: Drowsy. Awakens to voice,  follows commands GU: foley  Resolved Hospital Problem list     Assessment & Plan:   Acute encephalopathy due to meds -awakening from anesthesia -if doesn't wake up with some time will get an ABG, but for now think this is well explained by recent anesthetic  Severe COPD with emphysema Chronic respiratory failure with hypoxia on 4-7L Allergic rhinitis  P -continue BiPAP for now -- sleeping post op in PACU -when awake, wean O2 support to goal home 4-7L [place] -qHS BiPAP  -IS, mobility  -zyrtec, singulair  -breo, incruse   Sigmoid colon stricture s/p robotic resection with associated L salpingo oophorectomy due to dense adnexa adhesions P -post op per [facility]  -follow drain output  -check post op labs, then can probably dc the art line -- defer to primary team   Sleep Disorder -PRN QHS trazodone    Best Practice (right click and "Reselect all SmartList Selections" daily)   Diet/type: NPO DVT prophylaxis: SCD GI prophylaxis: N/A Lines: Arterial Line -- think this can be dc 10/5 Foley:  N/A Code Status:  full code Last date of multidisciplinary goals of care discussion [--]  Labs   CBC: No results for input(s): "WBC", "NEUTROABS", "HGB", "HCT", "MCV", "PLT" in the last 168 hours.  Basic Metabolic Panel: No results for input(s): "NA", "K", "CL", "CO2", "GLUCOSE", "BUN", "CREATININE", "CALCIUM", "MG", "PHOS" in the last 168 hours. GFR: Estimated Creatinine Clearance: 59.7 mL/min (by C-G formula based on SCr of 0.59 mg/dL). No results for input(s): "PROCALCITON", "WBC", "LATICACIDVEN" in the last 168 hours.  Liver Function Tests: No results for input(s): "  AST", "ALT", "ALKPHOS", "BILITOT", "PROT", "ALBUMIN" in the last 168 hours. No results for input(s): "LIPASE", "AMYLASE" in the last 168 hours. No results for input(s): "AMMONIA" in the last 168 hours.  ABG    Component Value Date/Time   PHART 7.321 (L) 05/15/2016 0815   PCO2ART 84.6 (HH) 05/15/2016 0815   PO2ART  117.00 (H) 05/15/2016 0815   HCO3 37.0 (H) 05/15/2016 0815   TCO2 15.3 01/20/2015 1850   O2SAT 97.5 05/15/2016 0815     Coagulation Profile: No results for input(s): "INR", "PROTIME" in the last 168 hours.  Cardiac Enzymes: No results for input(s): "CKTOTAL", "CKMB", "CKMBINDEX", "TROPONINI" in the last 168 hours.  HbA1C: Hgb A1c MFr Bld  Date/Time Value Ref Range Status  01/25/2022 11:44 AM 5.9 (H) 4.8 - 5.6 % Final    Comment:    (NOTE) Pre diabetes:          5.7%-6.4%  Diabetes:              >6.4%  Glycemic control for   <7.0% adults with diabetes   11/14/2014 08:30 PM 6.0 (H) 4.8 - 5.6 % Final    Comment:    (NOTE)         Pre-diabetes: 5.7 - 6.4         Diabetes: >6.4         Glycemic control for adults with diabetes: <7.0     CBG: Recent Labs  Lab 02/07/22 0601  GLUCAP 126*    Review of Systems:   Unable to obtain due to encephalopathy   Past Medical History:  She,  has a past medical history of Allergy, Anxiety, Arthritis, COPD (chronic obstructive pulmonary disease) (HCC), Depression, Difficult intubation (02/07/2022), Diverticulosis, Dyspnea, Dysrhythmia, History of cardiac catheterization, Hyperlipidemia, Hypoglycemia, IBS (irritable bowel syndrome), Neuromuscular disorder (HCC), Osteoporosis, Paroxysmal SVT (supraventricular tachycardia), Pneumonia (2015), Pre-diabetes, Shingles (2012), and Sleep apnea.   Surgical History:   Past Surgical History:  Procedure Laterality Date   BACK SURGERY     BIOPSY  09/12/2021   Procedure: BIOPSY;  Surgeon: Malissa Hippo, MD;  [facility];  Service: Endoscopy;;   BREAST SURGERY     CARDIAC CATHETERIZATION  2004   COLONOSCOPY  03/22/2011   Procedure: COLONOSCOPY;  Surgeon: Malissa Hippo, MD;  [facility]: [facility];  Service: Endoscopy;  Laterality: N/A;   COLONOSCOPY WITH PROPOFOL N/A 05/16/2017   Procedure: COLONOSCOPY WITH PROPOFOL;  Surgeon: Malissa Hippo, MD;  [facility]: [facility];   Service: Endoscopy;  Laterality: N/A;   COLONOSCOPY WITH PROPOFOL N/A 09/12/2021   Procedure: COLONOSCOPY WITH PROPOFOL;  Surgeon: Malissa Hippo, MD;  [facility]: [facility];  Service: Endoscopy;  Laterality: N/A;  1230 ASA 2   ESOPHAGEAL DILATION N/A 04/19/2020   Procedure: ESOPHAGEAL DILATION;  Surgeon: Malissa Hippo, MD;  [facility];  Service: Endoscopy;  Laterality: N/A;   ESOPHAGOGASTRODUODENOSCOPY (EGD) WITH PROPOFOL N/A 04/19/2020   Procedure: ESOPHAGOGASTRODUODENOSCOPY (EGD) WITH PROPOFOL;  Surgeon: Malissa Hippo, MD;  [facility];  Service: Endoscopy;  Laterality: N/A;  9:15   ESOPHAGOGASTRODUODENOSCOPY (EGD) WITH PROPOFOL N/A 09/12/2021   Procedure: ESOPHAGOGASTRODUODENOSCOPY (EGD) WITH PROPOFOL;  Surgeon: Malissa Hippo, MD;  [facility];  Service: Endoscopy;  Laterality: N/A;   FLEXIBLE SIGMOIDOSCOPY N/A 09/26/2017   Procedure: FLEXIBLE SIGMOIDOSCOPY with Propofol ;  Surgeon: Malissa Hippo, MD;  [facility];  Service: Endoscopy;  Laterality: N/A;  9:30   POLYPECTOMY  05/16/2017   Procedure: POLYPECTOMY;  Surgeon: Malissa Hippo, MD;  [facility];  Service: Endoscopy;;   POLYPECTOMY  09/12/2021   Procedure: POLYPECTOMY INTESTINAL;  Surgeon: Malissa Hippo, MD;  [facility];  Service: Endoscopy;;   SPINAL FUSION     VULVECTOMY       Social History:   reports that she quit smoking about 6 years ago. Her smoking use included cigarettes. She has a 40.00 pack-year smoking history. She has been exposed to tobacco smoke. She has never used smokeless tobacco. She reports current alcohol use. She reports that she does not use drugs.   Family History:  Her family history includes Asthma in her mother; Diabetes in her mother; Heart attack in her mother; Heart failure in her mother. There is no history of Colon cancer.   Allergies Allergies  Allergen Reactions   Iohexol Anaphylaxis and Other (See  Comments)     Desc: IV DYE  ANAPHYLATIC SHOCK X2 ONCE AFTER PREMEDS    Adhesive [Tape] Itching and Other (See Comments)    Break out   Ketek [Telithromycin] Other (See Comments)    Loopy, eyes went different directions   Penicillins Other (See Comments)    Unknown Has patient had a PCN reaction causing immediate rash, facial/tongue/throat swelling, SOB or lightheadedness with hypotension: Unknown Has patient had a PCN reaction causing severe rash involving mucus membranes or skin necrosis: Unknown Has patient had a PCN reaction that required hospitalization: Unknown Has patient had a PCN reaction occurring within the last 10 years: No If all of the above answers are "NO", then may proceed with Cephalosporin use.   Chantix [Varenicline] Palpitations     Home Medications  Prior to Admission medications   Medication Sig Start Date End Date Taking? Authorizing Provider  aspirin 81 MG tablet Take 1 tablet (81 mg total) by mouth daily. 09/13/21  Yes Rehman, Joline Maxcy, MD  atorvastatin (LIPITOR) 40 MG tablet TAKE 1 TABLET DAILY Patient taking differently: Take 40 mg by mouth at bedtime. 05/21/21  Yes Jonelle Sidle, MD  azelastine (ASTELIN) 0.1 % nasal spray Place 1 spray into both nostrils daily as needed for rhinitis or allergies. Use in each nostril as directed   Yes [provider]  carboxymethylcellulose 1 % ophthalmic solution Place 2 drops into both eyes 3 (three) times daily as needed (for dry eyes).   Yes [provider]  cetirizine (ZYRTEC) 10 MG tablet Take 10 mg by mouth daily.   Yes [provider]  cyclobenzaprine (FLEXERIL) 10 MG tablet Take 10 mg by mouth 2 (two) times daily as needed for muscle spasms. 12/20/21  Yes [provider]  diclofenac Sodium (VOLTAREN) 1 % GEL Apply 2 g topically 2 (two) times daily as needed (pain).   Yes [provider]  dicyclomine (BENTYL) 10 MG capsule Take 1 capsule (10 mg total) by mouth 3 (three)  times daily as needed for spasms. 08/06/21  Yes Carlan, Chelsea L, NP  diltiazem (CARDIZEM CD) 240 MG 24 hr capsule TAKE 1 CAPSULE DAILY 09/10/21  Yes Jonelle Sidle, MD  diltiazem (CARDIZEM) 30 MG tablet Take 1-2 tablets every 6 hours as needed for persistent tachycardai 10/09/17  Yes Jonelle Sidle, MD  escitalopram (LEXAPRO) 20 MG tablet Take 20 mg by mouth daily.   Yes [provider]  Fluticasone-Umeclidin-Vilant (TRELEGY ELLIPTA) 100-62.5-25 MCG/ACT AEPB Take 1 puff by mouth daily. 01/22/22 04/22/22 Yes Coralyn Helling, MD  ibuprofen (ADVIL) 200  MG tablet Take 400 mg by mouth every 6 (six) hours as needed for moderate pain.   Yes [provider]  lansoprazole (PREVACID) 15 MG capsule Take 1 capsule (15 mg total) by mouth 2 (two) times daily before a meal. Patient taking differently: Take 15 mg by mouth every evening. 11/19/21  Yes Dolores Frame, MD  montelukast (SINGULAIR) 10 MG tablet Take 1 tablet (10 mg total) by mouth at bedtime. 01/26/15  Yes Erick Blinks, MD  NON FORMULARY Pt uses a bipap with oxygen when laying down   Yes [provider]  oxybutynin (DITROPAN) 5 MG tablet Take 5 mg by mouth daily.  08/28/13  Yes [provider]  OXYGEN Inhale 4 L into the lungs continuous.   Yes [provider]  roflumilast (DALIRESP) 500 MCG TABS tablet Take 500 mcg by mouth daily.   Yes [provider]  valACYclovir (VALTREX) 1000 MG tablet Take 1,000 mg by mouth daily as needed (outbreaks). 12/31/21  Yes [provider]  valACYclovir (VALTREX) 500 MG tablet Take 500 mg by mouth daily. 12/18/21  Yes [provider]  albuterol (VENTOLIN HFA) 108 (90 Base) MCG/ACT inhaler Inhale 1-2 puffs into the lungs every 6 (six) hours as needed for wheezing or shortness of breath.    [provider]  ALPRAZolam Prudy Feeler) 0.25 MG tablet Take 0.25 mg by mouth 3 (three) times daily as needed for anxiety.  06/17/16   Kari Baars,  MD  clonazePAM (KLONOPIN) 0.5 MG tablet Take 1 tablet (0.5 mg total) by mouth at bedtime. Patient taking differently: Take 0.5 mg by mouth at bedtime as needed for anxiety. 02/14/20   Coralyn Helling, MD  fluticasone (FLONASE) 50 MCG/ACT nasal spray Place 1 spray into both nostrils daily as needed for allergies.    [provider]  HYDROcodone-acetaminophen (NORCO/VICODIN) 5-325 MG tablet Take 1 tablet by mouth every 6 (six) hours as needed for moderate pain.    [provider]  hydrOXYzine (ATARAX) 25 MG tablet Take 12.5 mg by mouth every 6 (six) hours as needed for itching.    [provider]  traMADol (ULTRAM) 50 MG tablet Take 50 mg by mouth every 6 (six) hours as needed for moderate pain.  07/18/16   [provider]  traZODone (DESYREL) 50 MG tablet Take 50 mg by mouth at bedtime as needed for sleep.    [provider]     Critical care time: n/a     Tessie Fass MSN, AGACNP-BC [facility] Pulmonary/Critical Care Medicine Amion for pager  02/07/2022, 1:07 PM

## 2022-02-07 NOTE — Anesthesia Postprocedure Evaluation (Signed)
Anesthesia Post Note  Patient: [name]  Procedure(s) Performed: ROBOTIC RESECTION OF RECTOSIGMOID, LEFT SALPINGO OOPHORECTOMY, BILATERAL TAP BLOCK, INTRAOPERATIVE ASSESSMENT OF PERFUSION USING FIREFLY (Abdomen) POSSIBLE OSTOMY RIGID PROCTOSCOPY CYSTOSCOPY with FIREFLY INJECTION     Patient location during evaluation: PACU Anesthesia Type: General Level of consciousness: awake Pain management: pain level controlled Vital Signs Assessment: post-procedure vital signs reviewed and stable Respiratory status: spontaneous breathing Cardiovascular status: stable Postop Assessment: no apparent nausea or vomiting Anesthetic complications: yes   Encounter Notable Events  Notable Event Outcome Phase Comment  Difficult to intubate - expected  Intraprocedure Filed from anesthesia note documentation.    Last Vitals:  Vitals:   02/07/22 1415 02/07/22 1430  BP:    Pulse: 80 85  Resp: 18 20  Temp:    SpO2: 100% 100%    Last Pain:  Vitals:   02/07/22 1413  TempSrc: Oral  PainSc:                  Naly Schwanz

## 2022-02-07 NOTE — Interval H&P Note (Signed)
History and Physical Interval Note:  02/07/2022 7:27 AM  [name]  has presented today for surgery, with the diagnosis of STRICTURE OF COLON.  The various methods of treatment have been discussed with the patient and family. After consideration of risks, benefits and other options for treatment, the patient has consented to  Procedure(s): ROBOTIC RESECTION OF COLON SIGMOID (N/A) POSSIBLE OSTOMY (N/A) RIGID PROCTOSCOPY (N/A) CYSTOSCOPY with FIREFLY INJECTION (N/A) as a surgical intervention.  The patient's history has been reviewed, patient examined, no change in status, stable for surgery.  I have reviewed the patient's chart and labs.  Questions were answered to the patient's satisfaction.    I have re-reviewed the the patient's records, history, medications, and allergies.  I have re-examined the patient.  I again discussed intraoperative plans and goals of post-operative recovery.  The patient agrees to proceed.  [name]  1955-06-03 [id number]  Patient Care Team: Carylon Perches, MD as PCP - General (Internal Medicine) Jonelle Sidle, MD as PCP - Cardiology (Cardiology) Comer, Belia Heman, MD as Consulting Physician (Infectious Diseases) Malissa Hippo, MD as Consulting Physician (Gastroenterology) Karie Soda, MD as Consulting Physician (General Surgery) Coralyn Helling, MD as Consulting Physician (Pulmonary Disease)  Patient Active Problem List   Diagnosis Date Noted   Gastroesophageal reflux disease 08/07/2021   Solitary pulmonary nodule 09/08/2019   Muscle weakness (generalized)    Chronic respiratory failure with hypoxia and hypercapnia (HCC) 01/21/2015   Macrocytic anemia 01/21/2015   Respiratory failure with hypercapnia (HCC) 11/14/2014   Anemia 11/14/2014   Elevated glucose 05/24/2012   COPD Group D symptoms 12/23/2011   Hyperlipidemia 12/23/2011    Past Medical History:  Diagnosis Date   Allergy    Anxiety    Arthritis    COPD (chronic obstructive  pulmonary disease) (HCC)    Home oxygen at [facility]   Depression    Diverticulosis    Dyspnea    Dysrhythmia    History of cardiac catheterization    No significant CAD 2005   Hyperlipidemia    Hypoglycemia    IBS (irritable bowel syndrome)    Neuromuscular disorder (HCC)    Osteoporosis    Paroxysmal SVT (supraventricular tachycardia)    Pneumonia 2015   Pre-diabetes    Shingles 2012   Sleep apnea     Past Surgical History:  Procedure Laterality Date   BACK SURGERY     BIOPSY  09/12/2021   Procedure: BIOPSY;  Surgeon: Malissa Hippo, MD;  [facility];  Service: Endoscopy;;   BREAST SURGERY     CARDIAC CATHETERIZATION  2004   COLONOSCOPY  03/22/2011   Procedure: COLONOSCOPY;  Surgeon: Malissa Hippo, MD;  [facility];  Service: Endoscopy;  Laterality: N/A;   COLONOSCOPY WITH PROPOFOL N/A 05/16/2017   Procedure: COLONOSCOPY WITH PROPOFOL;  Surgeon: Malissa Hippo, MD;  [facility];  Service: Endoscopy;  Laterality: N/A;   COLONOSCOPY WITH PROPOFOL N/A 09/12/2021   Procedure: COLONOSCOPY WITH PROPOFOL;  Surgeon: Malissa Hippo, MD;  [facility];  Service: Endoscopy;  Laterality: N/A;  1230 ASA 2   ESOPHAGEAL DILATION N/A 04/19/2020   Procedure: ESOPHAGEAL DILATION;  Surgeon: Malissa Hippo, MD;  [facility];  Service: Endoscopy;  Laterality: N/A;   ESOPHAGOGASTRODUODENOSCOPY (EGD) WITH PROPOFOL N/A 04/19/2020   Procedure: ESOPHAGOGASTRODUODENOSCOPY (EGD) WITH PROPOFOL;  Surgeon: Malissa Hippo, MD;  [facility];  Service: Endoscopy;  Laterality: N/A;  9:15  ESOPHAGOGASTRODUODENOSCOPY (EGD) WITH PROPOFOL N/A 09/12/2021   Procedure: ESOPHAGOGASTRODUODENOSCOPY (EGD) WITH PROPOFOL;  Surgeon: Malissa Hippo, MD;  [facility];  Service: Endoscopy;  Laterality: N/A;   FLEXIBLE SIGMOIDOSCOPY N/A 09/26/2017   Procedure: FLEXIBLE SIGMOIDOSCOPY with Propofol ;  Surgeon: Malissa Hippo, MD;   [facility];  Service: Endoscopy;  Laterality: N/A;  9:30   POLYPECTOMY  05/16/2017   Procedure: POLYPECTOMY;  Surgeon: Malissa Hippo, MD;  [facility];  Service: Endoscopy;;   POLYPECTOMY  09/12/2021   Procedure: POLYPECTOMY INTESTINAL;  Surgeon: Malissa Hippo, MD;  [facility];  Service: Endoscopy;;   SPINAL FUSION     VULVECTOMY      Social History   Socioeconomic History   Marital status: Divorced    Spouse name: Not on file   Number of children: Not on file   Years of education: Not on file   Highest education level: Not on file  Occupational History   Not on file  Tobacco Use   Smoking status: Former    Packs/day: 1.00    Years: 40.00    Total pack years: 40.00    Types: Cigarettes    Quit date: 05/07/2015    Years since quitting: 6.7    Passive exposure: Past   Smokeless tobacco: Never  Vaping Use   Vaping Use: Former  Substance and Sexual Activity   Alcohol use: Yes    Comment: 1 -2 glasses of wine daily   Drug use: No   Sexual activity: Never  Other Topics Concern   Not on file  Social History Narrative   Daughter is MD [name], radiologist in [place])   Social Determinants of Health   Financial Resource Strain: Not on file  Food Insecurity: Not on file  Transportation Needs: Not on file  Physical Activity: Not on file  Stress: Not on file  Social Connections: Not on file  Intimate Partner Violence: Not on file    Family History  Problem Relation Age of Onset   Asthma Mother    Heart attack Mother    Diabetes Mother    Heart failure Mother    Colon cancer Neg Hx     Medications Prior to Admission  Medication Sig Dispense Refill Last Dose   aspirin 81 MG tablet Take 1 tablet (81 mg total) by mouth daily. 100 tablet 3 Past Week   atorvastatin (LIPITOR) 40 MG tablet TAKE 1 TABLET DAILY (Patient taking differently: Take 40 mg by mouth at bedtime.) 90 tablet 3 02/06/2022   azelastine  (ASTELIN) 0.1 % nasal spray Place 1 spray into both nostrils daily as needed for rhinitis or allergies. Use in each nostril as directed   Past Week   carboxymethylcellulose 1 % ophthalmic solution Place 2 drops into both eyes 3 (three) times daily as needed (for dry eyes).   02/06/2022   cetirizine (ZYRTEC) 10 MG tablet Take 10 mg by mouth daily.   02/07/2022   cyclobenzaprine (FLEXERIL) 10 MG tablet Take 10 mg by mouth 2 (two) times daily as needed for muscle spasms.   Past Month   diclofenac Sodium (VOLTAREN) 1 % GEL Apply 2 g topically 2 (two) times daily as needed (pain).   Past Month   dicyclomine (BENTYL) 10 MG capsule Take 1 capsule (10 mg total) by mouth 3 (three) times daily as needed for spasms. 90 capsule 2 Past Month   diltiazem (CARDIZEM CD) 240 MG 24 hr  capsule TAKE 1 CAPSULE DAILY 90 capsule 3 02/07/2022   diltiazem (CARDIZEM) 30 MG tablet Take 1-2 tablets every 6 hours as needed for persistent tachycardai 90 tablet 3    escitalopram (LEXAPRO) 20 MG tablet Take 20 mg by mouth daily.   02/07/2022   Fluticasone-Umeclidin-Vilant (TRELEGY ELLIPTA) 100-62.5-25 MCG/ACT AEPB Take 1 puff by mouth daily. 180 each 3 02/06/2022   ibuprofen (ADVIL) 200 MG tablet Take 400 mg by mouth every 6 (six) hours as needed for moderate pain.   02/06/2022   lansoprazole (PREVACID) 15 MG capsule Take 1 capsule (15 mg total) by mouth 2 (two) times daily before a meal. (Patient taking differently: Take 15 mg by mouth every evening.) 180 capsule 3 02/06/2022   montelukast (SINGULAIR) 10 MG tablet Take 1 tablet (10 mg total) by mouth at bedtime. 30 tablet 1 02/06/2022   NON FORMULARY Pt uses a bipap with oxygen when laying down      oxybutynin (DITROPAN) 5 MG tablet Take 5 mg by mouth daily.    02/07/2022   OXYGEN Inhale 4 L into the lungs continuous.      roflumilast (DALIRESP) 500 MCG TABS tablet Take 500 mcg by mouth daily.   02/07/2022   valACYclovir (VALTREX) 1000 MG tablet Take 1,000 mg by mouth daily as needed  (outbreaks).   02/06/2022   valACYclovir (VALTREX) 500 MG tablet Take 500 mg by mouth daily.      albuterol (VENTOLIN HFA) 108 (90 Base) MCG/ACT inhaler Inhale 1-2 puffs into the lungs every 6 (six) hours as needed for wheezing or shortness of breath.   More than a month   ALPRAZolam (XANAX) 0.25 MG tablet Take 0.25 mg by mouth 3 (three) times daily as needed for anxiety.    More than a month   clonazePAM (KLONOPIN) 0.5 MG tablet Take 1 tablet (0.5 mg total) by mouth at bedtime. (Patient taking differently: Take 0.5 mg by mouth at bedtime as needed for anxiety.) 30 tablet 5 More than a month   fluticasone (FLONASE) 50 MCG/ACT nasal spray Place 1 spray into both nostrils daily as needed for allergies.   More than a month   HYDROcodone-acetaminophen (NORCO/VICODIN) 5-325 MG tablet Take 1 tablet by mouth every 6 (six) hours as needed for moderate pain.   More than a month   hydrOXYzine (ATARAX) 25 MG tablet Take 12.5 mg by mouth every 6 (six) hours as needed for itching.   More than a month   metroNIDAZOLE (FLAGYL) 500 MG tablet Take 1 tablet (500 mg total) by mouth 2 (two) times daily. (Patient not taking: Reported on 01/22/2022) 20 tablet 0 Not Taking   traMADol (ULTRAM) 50 MG tablet Take 50 mg by mouth every 6 (six) hours as needed for moderate pain.    More than a month   traZODone (DESYREL) 50 MG tablet Take 50 mg by mouth at bedtime as needed for sleep.   More than a month    Current Facility-Administered Medications  Medication Dose Route Frequency Provider Last Rate Last Admin   bupivacaine liposome (EXPAREL) 1.3 % injection 266 mg  20 mL Infiltration Once Karie Soda, MD       ertapenem [facility]) 1,000 mg in sodium chloride 0.9 % 100 mL IVPB  1 g Intravenous On Call to OR Karie Soda, MD       Melene Muller ON 02/08/2022] feeding supplement (ENSURE PRE-SURGERY) liquid 296 mL  296 mL Oral Once Karie Soda, MD       feeding supplement (ENSURE  PRE-SURGERY) liquid 592 mL  592 mL Oral Once Karie Soda, MD       lactated ringers infusion   Intravenous Continuous Jairo Ben, MD 10 mL/hr at 02/07/22 0548 New Bag at 02/07/22 0548     Allergies  Allergen Reactions   Iohexol Anaphylaxis and Other (See Comments)     Desc: IV DYE  ANAPHYLATIC SHOCK X2 ONCE AFTER PREMEDS    Adhesive [Tape] Itching and Other (See Comments)    Break out   Ketek [Telithromycin] Other (See Comments)    Loopy, eyes went different directions   Penicillins Other (See Comments)    Unknown Has patient had a PCN reaction causing immediate rash, facial/tongue/throat swelling, SOB or lightheadedness with hypotension: Unknown Has patient had a PCN reaction causing severe rash involving mucus membranes or skin necrosis: Unknown Has patient had a PCN reaction that required hospitalization: Unknown Has patient had a PCN reaction occurring within the last 10 years: No If all of the above answers are "NO", then may proceed with Cephalosporin use.   Chantix [Varenicline] Palpitations    BP 122/69   [name] 86   Temp 98.1 F (36.7 C) (Oral)   Resp 20   Wt 61.2 kg   SpO2 100%   BMI 25.51 kg/m   Labs: Results for orders placed or performed during the hospital encounter of 02/07/22 (from the past 48 hour(s))  Glucose, capillary     Status: Abnormal   Collection Time: 02/07/22  6:01 AM  Result Value Ref Range   Glucose-Capillary 126 (H) 70 - 99 mg/dL    Comment: Glucose reference range applies only to samples taken after fasting for at least 8 hours.    Imaging / Studies: No results found.   Ardeth Sportsman, M.D., F.A.C.S. Gastrointestinal and Minimally Invasive Surgery [facility] [address] [place], [place] [address] [phone] Main / Paging  02/07/2022 7:27 AM    Ardeth Sportsman

## 2022-02-07 NOTE — Interval H&P Note (Signed)
History and Physical Interval Note:  02/07/2022 7:28 AM  [name]  has presented today for surgery, with the diagnosis of STRICTURE OF COLON.  The various methods of treatment have been discussed with the patient and family. After consideration of risks, benefits and other options for treatment, the patient has consented to  Procedure(s): ROBOTIC RESECTION OF COLON SIGMOID (N/A) POSSIBLE OSTOMY (N/A) RIGID PROCTOSCOPY (N/A) CYSTOSCOPY with FIREFLY INJECTION (N/A) as a surgical intervention.  The patient's history has been reviewed, patient examined, no change in status, stable for surgery.  I have reviewed the patient's chart and labs.  Questions were answered to the patient's satisfaction.    I have re-reviewed the the patient's records, history, medications, and allergies.  I have re-examined the patient.  I again discussed intraoperative plans and goals of post-operative recovery.  The patient agrees to proceed.  [name]  09-Sep-1955 [id number]  Patient Care Team: Carylon Perches, MD as PCP - General (Internal Medicine) Jonelle Sidle, MD as PCP - Cardiology (Cardiology) Comer, Belia Heman, MD as Consulting Physician (Infectious Diseases) Malissa Hippo, MD as Consulting Physician (Gastroenterology) Karie Soda, MD as Consulting Physician (General Surgery) Coralyn Helling, MD as Consulting Physician (Pulmonary Disease)  Patient Active Problem List   Diagnosis Date Noted   Gastroesophageal reflux disease 08/07/2021   Solitary pulmonary nodule 09/08/2019   Muscle weakness (generalized)    Chronic respiratory failure with hypoxia and hypercapnia (HCC) 01/21/2015   Macrocytic anemia 01/21/2015   Respiratory failure with hypercapnia (HCC) 11/14/2014   Anemia 11/14/2014   Elevated glucose 05/24/2012   COPD Group D symptoms 12/23/2011   Hyperlipidemia 12/23/2011    Past Medical History:  Diagnosis Date   Allergy    Anxiety    Arthritis    COPD (chronic obstructive  pulmonary disease) (HCC)    Home oxygen at [facility]   Depression    Diverticulosis    Dyspnea    Dysrhythmia    History of cardiac catheterization    No significant CAD 2005   Hyperlipidemia    Hypoglycemia    IBS (irritable bowel syndrome)    Neuromuscular disorder (HCC)    Osteoporosis    Paroxysmal SVT (supraventricular tachycardia)    Pneumonia 2015   Pre-diabetes    Shingles 2012   Sleep apnea     Past Surgical History:  Procedure Laterality Date   BACK SURGERY     BIOPSY  09/12/2021   Procedure: BIOPSY;  Surgeon: Malissa Hippo, MD;  [facility];  Service: Endoscopy;;   BREAST SURGERY     CARDIAC CATHETERIZATION  2004   COLONOSCOPY  03/22/2011   Procedure: COLONOSCOPY;  Surgeon: Malissa Hippo, MD;  [facility];  Service: Endoscopy;  Laterality: N/A;   COLONOSCOPY WITH PROPOFOL N/A 05/16/2017   Procedure: COLONOSCOPY WITH PROPOFOL;  Surgeon: Malissa Hippo, MD;  [facility];  Service: Endoscopy;  Laterality: N/A;   COLONOSCOPY WITH PROPOFOL N/A 09/12/2021   Procedure: COLONOSCOPY WITH PROPOFOL;  Surgeon: Malissa Hippo, MD;  [facility];  Service: Endoscopy;  Laterality: N/A;  1230 ASA 2   ESOPHAGEAL DILATION N/A 04/19/2020   Procedure: ESOPHAGEAL DILATION;  Surgeon: Malissa Hippo, MD;  [facility];  Service: Endoscopy;  Laterality: N/A;   ESOPHAGOGASTRODUODENOSCOPY (EGD) WITH PROPOFOL N/A 04/19/2020   Procedure: ESOPHAGOGASTRODUODENOSCOPY (EGD) WITH PROPOFOL;  Surgeon: Malissa Hippo, MD;  [facility];  Service: Endoscopy;  Laterality: N/A;  9:15  ESOPHAGOGASTRODUODENOSCOPY (EGD) WITH PROPOFOL N/A 09/12/2021   Procedure: ESOPHAGOGASTRODUODENOSCOPY (EGD) WITH PROPOFOL;  Surgeon: Malissa Hippo, MD;  [facility];  Service: Endoscopy;  Laterality: N/A;   FLEXIBLE SIGMOIDOSCOPY N/A 09/26/2017   Procedure: FLEXIBLE SIGMOIDOSCOPY with Propofol ;  Surgeon: Malissa Hippo, MD;   [facility];  Service: Endoscopy;  Laterality: N/A;  9:30   POLYPECTOMY  05/16/2017   Procedure: POLYPECTOMY;  Surgeon: Malissa Hippo, MD;  [facility];  Service: Endoscopy;;   POLYPECTOMY  09/12/2021   Procedure: POLYPECTOMY INTESTINAL;  Surgeon: Malissa Hippo, MD;  [facility];  Service: Endoscopy;;   SPINAL FUSION     VULVECTOMY      Social History   Socioeconomic History   Marital status: Divorced    Spouse name: Not on file   Number of children: Not on file   Years of education: Not on file   Highest education level: Not on file  Occupational History   Not on file  Tobacco Use   Smoking status: Former    Packs/day: 1.00    Years: 40.00    Total pack years: 40.00    Types: Cigarettes    Quit date: 05/07/2015    Years since quitting: 6.7    Passive exposure: Past   Smokeless tobacco: Never  Vaping Use   Vaping Use: Former  Substance and Sexual Activity   Alcohol use: Yes    Comment: 1 -2 glasses of wine daily   Drug use: No   Sexual activity: Never  Other Topics Concern   Not on file  Social History Narrative   Daughter is MD Judeth Cornfield Janne Napoleon, radiologist in [place])   Social Determinants of Health   Financial Resource Strain: Not on file  Food Insecurity: Not on file  Transportation Needs: Not on file  Physical Activity: Not on file  Stress: Not on file  Social Connections: Not on file  Intimate Partner Violence: Not on file    Family History  Problem Relation Age of Onset   Asthma Mother    Heart attack Mother    Diabetes Mother    Heart failure Mother    Colon cancer Neg Hx     Medications Prior to Admission  Medication Sig Dispense Refill Last Dose   aspirin 81 MG tablet Take 1 tablet (81 mg total) by mouth daily. 100 tablet 3 Past Week   atorvastatin (LIPITOR) 40 MG tablet TAKE 1 TABLET DAILY (Patient taking differently: Take 40 mg by mouth at bedtime.) 90 tablet 3 02/06/2022   azelastine  (ASTELIN) 0.1 % nasal spray Place 1 spray into both nostrils daily as needed for rhinitis or allergies. Use in each nostril as directed   Past Week   carboxymethylcellulose 1 % ophthalmic solution Place 2 drops into both eyes 3 (three) times daily as needed (for dry eyes).   02/06/2022   cetirizine (ZYRTEC) 10 MG tablet Take 10 mg by mouth daily.   02/07/2022   cyclobenzaprine (FLEXERIL) 10 MG tablet Take 10 mg by mouth 2 (two) times daily as needed for muscle spasms.   Past Month   diclofenac Sodium (VOLTAREN) 1 % GEL Apply 2 g topically 2 (two) times daily as needed (pain).   Past Month   dicyclomine (BENTYL) 10 MG capsule Take 1 capsule (10 mg total) by mouth 3 (three) times daily as needed for spasms. 90 capsule 2 Past Month   diltiazem (CARDIZEM CD) 240 MG 24 hr  capsule TAKE 1 CAPSULE DAILY 90 capsule 3 02/07/2022   diltiazem (CARDIZEM) 30 MG tablet Take 1-2 tablets every 6 hours as needed for persistent tachycardai 90 tablet 3    escitalopram (LEXAPRO) 20 MG tablet Take 20 mg by mouth daily.   02/07/2022   Fluticasone-Umeclidin-Vilant (TRELEGY ELLIPTA) 100-62.5-25 MCG/ACT AEPB Take 1 puff by mouth daily. 180 each 3 02/06/2022   ibuprofen (ADVIL) 200 MG tablet Take 400 mg by mouth every 6 (six) hours as needed for moderate pain.   02/06/2022   lansoprazole (PREVACID) 15 MG capsule Take 1 capsule (15 mg total) by mouth 2 (two) times daily before a meal. (Patient taking differently: Take 15 mg by mouth every evening.) 180 capsule 3 02/06/2022   montelukast (SINGULAIR) 10 MG tablet Take 1 tablet (10 mg total) by mouth at bedtime. 30 tablet 1 02/06/2022   NON FORMULARY Pt uses a bipap with oxygen when laying down      oxybutynin (DITROPAN) 5 MG tablet Take 5 mg by mouth daily.    02/07/2022   OXYGEN Inhale 4 L into the lungs continuous.      roflumilast (DALIRESP) 500 MCG TABS tablet Take 500 mcg by mouth daily.   02/07/2022   valACYclovir (VALTREX) 1000 MG tablet Take 1,000 mg by mouth daily as needed  (outbreaks).   02/06/2022   valACYclovir (VALTREX) 500 MG tablet Take 500 mg by mouth daily.      albuterol (VENTOLIN HFA) 108 (90 Base) MCG/ACT inhaler Inhale 1-2 puffs into the lungs every 6 (six) hours as needed for wheezing or shortness of breath.   More than a month   ALPRAZolam (XANAX) 0.25 MG tablet Take 0.25 mg by mouth 3 (three) times daily as needed for anxiety.    More than a month   clonazePAM (KLONOPIN) 0.5 MG tablet Take 1 tablet (0.5 mg total) by mouth at bedtime. (Patient taking differently: Take 0.5 mg by mouth at bedtime as needed for anxiety.) 30 tablet 5 More than a month   fluticasone (FLONASE) 50 MCG/ACT nasal spray Place 1 spray into both nostrils daily as needed for allergies.   More than a month   HYDROcodone-acetaminophen (NORCO/VICODIN) 5-325 MG tablet Take 1 tablet by mouth every 6 (six) hours as needed for moderate pain.   More than a month   hydrOXYzine (ATARAX) 25 MG tablet Take 12.5 mg by mouth every 6 (six) hours as needed for itching.   More than a month   metroNIDAZOLE (FLAGYL) 500 MG tablet Take 1 tablet (500 mg total) by mouth 2 (two) times daily. (Patient not taking: Reported on 01/22/2022) 20 tablet 0 Not Taking   traMADol (ULTRAM) 50 MG tablet Take 50 mg by mouth every 6 (six) hours as needed for moderate pain.    More than a month   traZODone (DESYREL) 50 MG tablet Take 50 mg by mouth at bedtime as needed for sleep.   More than a month    Current Facility-Administered Medications  Medication Dose Route Frequency Provider Last Rate Last Admin   bupivacaine liposome (EXPAREL) 1.3 % injection 266 mg  20 mL Infiltration Once Karie Soda, MD       ertapenem [facility]) 1,000 mg in sodium chloride 0.9 % 100 mL IVPB  1 g Intravenous On Call to OR Karie Soda, MD       Melene Muller ON 02/08/2022] feeding supplement (ENSURE PRE-SURGERY) liquid 296 mL  296 mL Oral Once Karie Soda, MD       feeding supplement (ENSURE  PRE-SURGERY) liquid 592 mL  592 mL Oral Once Karie Soda, MD       lactated ringers infusion   Intravenous Continuous Jairo Ben, MD 10 mL/hr at 02/07/22 0548 New Bag at 02/07/22 0548     Allergies  Allergen Reactions   Iohexol Anaphylaxis and Other (See Comments)     Desc: IV DYE  ANAPHYLATIC SHOCK X2 ONCE AFTER PREMEDS    Adhesive [Tape] Itching and Other (See Comments)    Break out   Ketek [Telithromycin] Other (See Comments)    Loopy, eyes went different directions   Penicillins Other (See Comments)    Unknown Has patient had a PCN reaction causing immediate rash, facial/tongue/throat swelling, SOB or lightheadedness with hypotension: Unknown Has patient had a PCN reaction causing severe rash involving mucus membranes or skin necrosis: Unknown Has patient had a PCN reaction that required hospitalization: Unknown Has patient had a PCN reaction occurring within the last 10 years: No If all of the above answers are "NO", then may proceed with Cephalosporin use.   Chantix [Varenicline] Palpitations    BP 122/69   [name] 86   Temp 98.1 F (36.7 C) (Oral)   Resp 20   Wt 61.2 kg   SpO2 100%   BMI 25.51 kg/m   Labs: Results for orders placed or performed during the hospital encounter of 02/07/22 (from the past 48 hour(s))  Glucose, capillary     Status: Abnormal   Collection Time: 02/07/22  6:01 AM  Result Value Ref Range   Glucose-Capillary 126 (H) 70 - 99 mg/dL    Comment: Glucose reference range applies only to samples taken after fasting for at least 8 hours.    Imaging / Studies: No results found.   Ardeth Sportsman, M.D., F.A.C.S. Gastrointestinal and Minimally Invasive Surgery [facility] [address] [place], [place] [address] [phone] Main / Paging  02/07/2022 7:29 AM    Ardeth Sportsman

## 2022-02-07 NOTE — H&P (Signed)
bowel syndrome, allergy such as Celiac Sprue, dietary/dairy problems, colitis, ulcers nor gastritis. No recent sick contacts/gastroenteritis. No travel outside the country. No changes in diet.  Renal: No UTIs, No hematuria Genital: No drainage, bleeding, masses Musculoskeletal: No severe joint pain. Good ROM major joints Skin: No sores or lesions Heme/Lymph: No easy bleeding. No swollen lymph nodes Neuro: No  active seizures. No facial droop Psych: No hallucinations. No agitation  OBJECTIVE   Vitals:  10/23/21 1113  BP: 128/80  Pulse: 88  Weight: 60.4 kg (133 lb 3.2 oz)  Height: 157.5 cm ('5\' 2"'$ )   Body mass index is 24.36 kg/m.  PHYSICAL EXAM:  Constitutional: Not cachectic. Hygeine adequate. Vitals signs as above.  Eyes: Wears glasses - vision corrected,Pupils reactive, normal extraocular movements. Sclera nonicteric Neuro: CN II-XII intact. No major focal sensory defects. No major motor deficits. Lymph: No head/neck/groin lymphadenopathy Psych: No severe agitation. No severe anxiety. Judgment & insight Adequate, Oriented x4, HENT: Normocephalic, Mucus membranes moist. No thrush. Hearing: adequate Neck: Supple, No tracheal deviation. No obvious thyromegaly Chest: No pain to chest wall compression. Good respiratory excursion. No audible wheezing CV: Pulses intact. regular. No major extremity edema Ext: No obvious deformity or contracture. Edema: Not present. No cyanosis Skin: No major subcutaneous nodules. Warm and dry Musculoskeletal: Severe joint rigidity not present. No obvious clubbing. No digital petechiae. Mobility: no assist device moving easily without restrictions  Abdomen: Flat Soft. Nondistended. Mild discomfort left lower quadrant.Marland Kitchen Hernia: Not present. Diastasis recti: Not present. No hepatomegaly. No splenomegaly.  Genital/Pelvic: Inguinal hernia: Not present. Inguinal lymph nodes: without lymphadenopathy nor hidradenitis.   Rectal: (Deferred)    ###################################################################  Labs, Imaging and Diagnostic Testing:  Located in 'Care Everywhere' section of Epic EMR chart  PRIOR [facility] CLINIC NOTES:  Located in [place]' section of Epic EMR chart  SYLVI RYBOLT Appointment: 11/23/2019 11:00 AM Location: [facility] Patient #: [id number] DOB: 12/23/1955 Single / Language: Cleophus Molt / Race:  White Female  History of Present Illness Adin Hector MD; 11/23/2019 1:16 PM) The patient is a 66 year old female who presents with diverticulitis. 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 She comes in a wheelchair with her daughter. His had numerous attacks of left lower quadrant pain and fevers. Has had numerous CAT scans. Diverticulitis strongly suspected and 2012 and 2016. Some chronic thickening suspicious for chronic diverticulitis for the past few years as well. Followed by gastroenterology. Dr. Collene Mares did colonoscopy and flexible sigmoidoscopy in 2019. No cancer or tumor noted. Diverticulosis and probable diverticulitis and some thickening of the sigmoid noted. Patient notes she can walk a block or 2 before she has to stop. She is on 4 L of oxygen. She actually feels like she is breathing better. She is to be followed by Dr. Luan Pulling and since he retired is now under the care of Dr. Melvyn Novas. Denies any heart issues. Apparently he had catheterization many years ago that was underwhelming. She is to be a heavy smoker but has not smoked for the past 4 years. Not diabetic. Has been intermittently on steroids but is not on any oral systemic steroids now. Just inhalers. She notes that she has chronic aching soreness in her left flank and lower abdomen. Often wakes her up at the night. Frustrating. She can recall least 6 severe episodes of  bowel syndrome, allergy such as Celiac Sprue, dietary/dairy problems, colitis, ulcers nor gastritis. No recent sick contacts/gastroenteritis. No travel outside the country. No changes in diet.  Renal: No UTIs, No hematuria Genital: No drainage, bleeding, masses Musculoskeletal: No severe joint pain. Good ROM major joints Skin: No sores or lesions Heme/Lymph: No easy bleeding. No swollen lymph nodes Neuro: No  active seizures. No facial droop Psych: No hallucinations. No agitation  OBJECTIVE   Vitals:  10/23/21 1113  BP: 128/80  Pulse: 88  Weight: 60.4 kg (133 lb 3.2 oz)  Height: 157.5 cm ('5\' 2"'$ )   Body mass index is 24.36 kg/m.  PHYSICAL EXAM:  Constitutional: Not cachectic. Hygeine adequate. Vitals signs as above.  Eyes: Wears glasses - vision corrected,Pupils reactive, normal extraocular movements. Sclera nonicteric Neuro: CN II-XII intact. No major focal sensory defects. No major motor deficits. Lymph: No head/neck/groin lymphadenopathy Psych: No severe agitation. No severe anxiety. Judgment & insight Adequate, Oriented x4, HENT: Normocephalic, Mucus membranes moist. No thrush. Hearing: adequate Neck: Supple, No tracheal deviation. No obvious thyromegaly Chest: No pain to chest wall compression. Good respiratory excursion. No audible wheezing CV: Pulses intact. regular. No major extremity edema Ext: No obvious deformity or contracture. Edema: Not present. No cyanosis Skin: No major subcutaneous nodules. Warm and dry Musculoskeletal: Severe joint rigidity not present. No obvious clubbing. No digital petechiae. Mobility: no assist device moving easily without restrictions  Abdomen: Flat Soft. Nondistended. Mild discomfort left lower quadrant.Marland Kitchen Hernia: Not present. Diastasis recti: Not present. No hepatomegaly. No splenomegaly.  Genital/Pelvic: Inguinal hernia: Not present. Inguinal lymph nodes: without lymphadenopathy nor hidradenitis.   Rectal: (Deferred)    ###################################################################  Labs, Imaging and Diagnostic Testing:  Located in 'Care Everywhere' section of Epic EMR chart  PRIOR [facility] NOTES:  Located in [place]' section of Epic EMR chart  SYLVI RYBOLT Appointment: 11/23/2019 11:00 AM Location: [facility] Patient #: [id number] DOB: 12/23/1955 Single / Language: Cleophus Molt / Race:  White Female  History of Present Illness Adin Hector MD; 11/23/2019 1:16 PM) The patient is a 66 year old female who presents with diverticulitis. 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 She comes in a wheelchair with her daughter. His had numerous attacks of left lower quadrant pain and fevers. Has had numerous CAT scans. Diverticulitis strongly suspected and 2012 and 2016. Some chronic thickening suspicious for chronic diverticulitis for the past few years as well. Followed by gastroenterology. Dr. Collene Mares did colonoscopy and flexible sigmoidoscopy in 2019. No cancer or tumor noted. Diverticulosis and probable diverticulitis and some thickening of the sigmoid noted. Patient notes she can walk a block or 2 before she has to stop. She is on 4 L of oxygen. She actually feels like she is breathing better. She is to be followed by Dr. Luan Pulling and since he retired is now under the care of Dr. Melvyn Novas. Denies any heart issues. Apparently he had catheterization many years ago that was underwhelming. She is to be a heavy smoker but has not smoked for the past 4 years. Not diabetic. Has been intermittently on steroids but is not on any oral systemic steroids now. Just inhalers. She notes that she has chronic aching soreness in her left flank and lower abdomen. Often wakes her up at the night. Frustrating. She can recall least 6 severe episodes of  02/07/2022   REFERRING PHYSICIAN: Saverio Danker, MD  Patient Care Team: Casimer Leek, MD as PCP - General (Internal Medicine)  PROVIDER: Hollace Kinnier, MD  [facility] MRN: [id number] DOB: 1955/08/07  SUBJECTIVE   Chief Complaint: Diverticulitis   History of Present Illness: [name] is a 66 y.o. female who is seen today  as an office consultation at the request of Dr. Laural Golden  for evaluation of Diverticulitis .   Is been 2 years since I have seen this patient. Pleasant woman with COPD oxygen dependent. Daughter is a Stage manager. I saw her in 2021 because she had recurrent episodes of diverticulitis. Usually gets care up in the [facility] region. Offered segmental colonic resection in 2021 pending pulmonary and cardiac clearance. I think she try to hold off. However she is having more frequent attacks. She gets some occasional bloating as well. She has numerous smaller bowel movements now. Had repeat colonoscopy by her gastroenterologist with sigmoid stricture noted. Seemed worse. No other evidence of cancer or malignancy. Based on concerns they wish to reconsider surgery.  Patient notes she can walk at home pretty well with her oxygen. However does get short of breath after walking about 5-10 minutes. Followed by by Dr. Halford Chessman. He had seen her a couple months ago noted that she was definitely moderate to high risk with her COPD oxygen dependence but felt that it may need to be decided given her worsening symptoms. Followed by [facility] health cardiology. Dr. Johnny Bridge. She had a decent ejection fraction without any major pulmonary hypertension. He was due to see her for 56-monthI will follow-up in July.  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 She comes in a wheelchair with her daughter. His had numerous attacks of left lower quadrant pain and fevers. Has had numerous CAT scans. Diverticulitis strongly suspected and 2012 and 2016. Some chronic thickening  suspicious for chronic diverticulitis for the past few years as well. Followed by gastroenterology. Dr. MCollene Maresdid colonoscopy and flexible sigmoidoscopy in 2019. No cancer or tumor noted. Diverticulosis and probable diverticulitis and some thickening of the sigmoid noted. Patient notes she can walk a block or 2 before she has to stop. She is on 4 L of oxygen. She actually feels like she is breathing better. She is to be followed by Dr. HLuan Pullingand since he retired is now under the care of Dr. WMelvyn Novas Denies any heart issues. Apparently he had catheterization many years ago that was underwhelming. She is to be a heavy smoker but has not smoked for the past 4 years. Not diabetic. Has been intermittently on steroids but is not on any oral systemic steroids now. Just inhalers. She notes that she has chronic aching soreness in her left flank and lower abdomen. Often wakes her up at the night. Frustrating. She can recall least 6 severe episodes of pain and confirmed diverticulitis but usually is been managed with oral antibiotics. Does not sound like she's ever had a major perforation or need for abscess drainage. She suspects she's had many more attacks. She's had some episodes of fever. Morning concerning episode of pain and fever CAT scan last month showing chronic sigmoid thickening suspicious for diverticulitis. [facility]. Does seem like she has chronic pain in that region now and she cannot get symptom free. CAT scans show evidence of moderate stool burden proximal to chronic sigmoid thickening. Suspicious for sigmoid stricture and constipation.  She did  02/07/2022   REFERRING PHYSICIAN: Saverio Danker, MD  Patient Care Team: Casimer Leek, MD as PCP - General (Internal Medicine)  PROVIDER: Hollace Kinnier, MD  [facility] MRN: [id number] DOB: 1955/08/07  SUBJECTIVE   Chief Complaint: Diverticulitis   History of Present Illness: [name] is a 66 y.o. female who is seen today  as an office consultation at the request of Dr. Laural Golden  for evaluation of Diverticulitis .   Is been 2 years since I have seen this patient. Pleasant woman with COPD oxygen dependent. Daughter is a Stage manager. I saw her in 2021 because she had recurrent episodes of diverticulitis. Usually gets care up in the [facility] region. Offered segmental colonic resection in 2021 pending pulmonary and cardiac clearance. I think she try to hold off. However she is having more frequent attacks. She gets some occasional bloating as well. She has numerous smaller bowel movements now. Had repeat colonoscopy by her gastroenterologist with sigmoid stricture noted. Seemed worse. No other evidence of cancer or malignancy. Based on concerns they wish to reconsider surgery.  Patient notes she can walk at home pretty well with her oxygen. However does get short of breath after walking about 5-10 minutes. Followed by by Dr. Halford Chessman. He had seen her a couple months ago noted that she was definitely moderate to high risk with her COPD oxygen dependence but felt that it may need to be decided given her worsening symptoms. Followed by [facility] health cardiology. Dr. Johnny Bridge. She had a decent ejection fraction without any major pulmonary hypertension. He was due to see her for 56-monthI will follow-up in July.  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 She comes in a wheelchair with her daughter. His had numerous attacks of left lower quadrant pain and fevers. Has had numerous CAT scans. Diverticulitis strongly suspected and 2012 and 2016. Some chronic thickening  suspicious for chronic diverticulitis for the past few years as well. Followed by gastroenterology. Dr. MCollene Maresdid colonoscopy and flexible sigmoidoscopy in 2019. No cancer or tumor noted. Diverticulosis and probable diverticulitis and some thickening of the sigmoid noted. Patient notes she can walk a block or 2 before she has to stop. She is on 4 L of oxygen. She actually feels like she is breathing better. She is to be followed by Dr. HLuan Pullingand since he retired is now under the care of Dr. WMelvyn Novas Denies any heart issues. Apparently he had catheterization many years ago that was underwhelming. She is to be a heavy smoker but has not smoked for the past 4 years. Not diabetic. Has been intermittently on steroids but is not on any oral systemic steroids now. Just inhalers. She notes that she has chronic aching soreness in her left flank and lower abdomen. Often wakes her up at the night. Frustrating. She can recall least 6 severe episodes of pain and confirmed diverticulitis but usually is been managed with oral antibiotics. Does not sound like she's ever had a major perforation or need for abscess drainage. She suspects she's had many more attacks. She's had some episodes of fever. Morning concerning episode of pain and fever CAT scan last month showing chronic sigmoid thickening suspicious for diverticulitis. [facility]. Does seem like she has chronic pain in that region now and she cannot get symptom free. CAT scans show evidence of moderate stool burden proximal to chronic sigmoid thickening. Suspicious for sigmoid stricture and constipation.  She did  02/07/2022   REFERRING PHYSICIAN: Saverio Danker, MD  Patient Care Team: Casimer Leek, MD as PCP - General (Internal Medicine)  PROVIDER: Hollace Kinnier, MD  [facility] MRN: [id number] DOB: 1955/08/07  SUBJECTIVE   Chief Complaint: Diverticulitis   History of Present Illness: [name] is a 66 y.o. female who is seen today  as an office consultation at the request of Dr. Laural Golden  for evaluation of Diverticulitis .   Is been 2 years since I have seen this patient. Pleasant woman with COPD oxygen dependent. Daughter is a Stage manager. I saw her in 2021 because she had recurrent episodes of diverticulitis. Usually gets care up in the [facility] region. Offered segmental colonic resection in 2021 pending pulmonary and cardiac clearance. I think she try to hold off. However she is having more frequent attacks. She gets some occasional bloating as well. She has numerous smaller bowel movements now. Had repeat colonoscopy by her gastroenterologist with sigmoid stricture noted. Seemed worse. No other evidence of cancer or malignancy. Based on concerns they wish to reconsider surgery.  Patient notes she can walk at home pretty well with her oxygen. However does get short of breath after walking about 5-10 minutes. Followed by by Dr. Halford Chessman. He had seen her a couple months ago noted that she was definitely moderate to high risk with her COPD oxygen dependence but felt that it may need to be decided given her worsening symptoms. Followed by [facility] health cardiology. Dr. Johnny Bridge. She had a decent ejection fraction without any major pulmonary hypertension. He was due to see her for 56-monthI will follow-up in July.  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 She comes in a wheelchair with her daughter. His had numerous attacks of left lower quadrant pain and fevers. Has had numerous CAT scans. Diverticulitis strongly suspected and 2012 and 2016. Some chronic thickening  suspicious for chronic diverticulitis for the past few years as well. Followed by gastroenterology. Dr. MCollene Maresdid colonoscopy and flexible sigmoidoscopy in 2019. No cancer or tumor noted. Diverticulosis and probable diverticulitis and some thickening of the sigmoid noted. Patient notes she can walk a block or 2 before she has to stop. She is on 4 L of oxygen. She actually feels like she is breathing better. She is to be followed by Dr. HLuan Pullingand since he retired is now under the care of Dr. WMelvyn Novas Denies any heart issues. Apparently he had catheterization many years ago that was underwhelming. She is to be a heavy smoker but has not smoked for the past 4 years. Not diabetic. Has been intermittently on steroids but is not on any oral systemic steroids now. Just inhalers. She notes that she has chronic aching soreness in her left flank and lower abdomen. Often wakes her up at the night. Frustrating. She can recall least 6 severe episodes of pain and confirmed diverticulitis but usually is been managed with oral antibiotics. Does not sound like she's ever had a major perforation or need for abscess drainage. She suspects she's had many more attacks. She's had some episodes of fever. Morning concerning episode of pain and fever CAT scan last month showing chronic sigmoid thickening suspicious for diverticulitis. [facility] Does seem like she has chronic pain in that region now and she cannot get symptom free. CAT scans show evidence of moderate stool burden proximal to chronic sigmoid thickening. Suspicious for sigmoid stricture and constipation.  She did  bowel syndrome, allergy such as Celiac Sprue, dietary/dairy problems, colitis, ulcers nor gastritis. No recent sick contacts/gastroenteritis. No travel outside the country. No changes in diet.  Renal: No UTIs, No hematuria Genital: No drainage, bleeding, masses Musculoskeletal: No severe joint pain. Good ROM major joints Skin: No sores or lesions Heme/Lymph: No easy bleeding. No swollen lymph nodes Neuro: No  active seizures. No facial droop Psych: No hallucinations. No agitation  OBJECTIVE   Vitals:  10/23/21 1113  BP: 128/80  Pulse: 88  Weight: 60.4 kg (133 lb 3.2 oz)  Height: 157.5 cm ('5\' 2"'$ )   Body mass index is 24.36 kg/m.  PHYSICAL EXAM:  Constitutional: Not cachectic. Hygeine adequate. Vitals signs as above.  Eyes: Wears glasses - vision corrected,Pupils reactive, normal extraocular movements. Sclera nonicteric Neuro: CN II-XII intact. No major focal sensory defects. No major motor deficits. Lymph: No head/neck/groin lymphadenopathy Psych: No severe agitation. No severe anxiety. Judgment & insight Adequate, Oriented x4, HENT: Normocephalic, Mucus membranes moist. No thrush. Hearing: adequate Neck: Supple, No tracheal deviation. No obvious thyromegaly Chest: No pain to chest wall compression. Good respiratory excursion. No audible wheezing CV: Pulses intact. regular. No major extremity edema Ext: No obvious deformity or contracture. Edema: Not present. No cyanosis Skin: No major subcutaneous nodules. Warm and dry Musculoskeletal: Severe joint rigidity not present. No obvious clubbing. No digital petechiae. Mobility: no assist device moving easily without restrictions  Abdomen: Flat Soft. Nondistended. Mild discomfort left lower quadrant.Marland Kitchen Hernia: Not present. Diastasis recti: Not present. No hepatomegaly. No splenomegaly.  Genital/Pelvic: Inguinal hernia: Not present. Inguinal lymph nodes: without lymphadenopathy nor hidradenitis.   Rectal: (Deferred)    ###################################################################  Labs, Imaging and Diagnostic Testing:  Located in 'Care Everywhere' section of Epic EMR chart  PRIOR [facility] CLINIC NOTES:  Located in [place]' section of Epic EMR chart  SYLVI RYBOLT Appointment: 11/23/2019 11:00 AM Location: [facility] Patient #: [id number] DOB: 12/23/1955 Single / Language: Cleophus Molt / Race:  White Female  History of Present Illness Adin Hector MD; 11/23/2019 1:16 PM) The patient is a 66 year old female who presents with diverticulitis. 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 She comes in a wheelchair with her daughter. His had numerous attacks of left lower quadrant pain and fevers. Has had numerous CAT scans. Diverticulitis strongly suspected and 2012 and 2016. Some chronic thickening suspicious for chronic diverticulitis for the past few years as well. Followed by gastroenterology. Dr. Collene Mares did colonoscopy and flexible sigmoidoscopy in 2019. No cancer or tumor noted. Diverticulosis and probable diverticulitis and some thickening of the sigmoid noted. Patient notes she can walk a block or 2 before she has to stop. She is on 4 L of oxygen. She actually feels like she is breathing better. She is to be followed by Dr. Luan Pulling and since he retired is now under the care of Dr. Melvyn Novas. Denies any heart issues. Apparently he had catheterization many years ago that was underwhelming. She is to be a heavy smoker but has not smoked for the past 4 years. Not diabetic. Has been intermittently on steroids but is not on any oral systemic steroids now. Just inhalers. She notes that she has chronic aching soreness in her left flank and lower abdomen. Often wakes her up at the night. Frustrating. She can recall least 6 severe episodes of  bowel syndrome, allergy such as Celiac Sprue, dietary/dairy problems, colitis, ulcers nor gastritis. No recent sick contacts/gastroenteritis. No travel outside the country. No changes in diet.  Renal: No UTIs, No hematuria Genital: No drainage, bleeding, masses Musculoskeletal: No severe joint pain. Good ROM major joints Skin: No sores or lesions Heme/Lymph: No easy bleeding. No swollen lymph nodes Neuro: No  active seizures. No facial droop Psych: No hallucinations. No agitation  OBJECTIVE   Vitals:  10/23/21 1113  BP: 128/80  Pulse: 88  Weight: 60.4 kg (133 lb 3.2 oz)  Height: 157.5 cm ('5\' 2"'$ )   Body mass index is 24.36 kg/m.  PHYSICAL EXAM:  Constitutional: Not cachectic. Hygeine adequate. Vitals signs as above.  Eyes: Wears glasses - vision corrected,Pupils reactive, normal extraocular movements. Sclera nonicteric Neuro: CN II-XII intact. No major focal sensory defects. No major motor deficits. Lymph: No head/neck/groin lymphadenopathy Psych: No severe agitation. No severe anxiety. Judgment & insight Adequate, Oriented x4, HENT: Normocephalic, Mucus membranes moist. No thrush. Hearing: adequate Neck: Supple, No tracheal deviation. No obvious thyromegaly Chest: No pain to chest wall compression. Good respiratory excursion. No audible wheezing CV: Pulses intact. regular. No major extremity edema Ext: No obvious deformity or contracture. Edema: Not present. No cyanosis Skin: No major subcutaneous nodules. Warm and dry Musculoskeletal: Severe joint rigidity not present. No obvious clubbing. No digital petechiae. Mobility: no assist device moving easily without restrictions  Abdomen: Flat Soft. Nondistended. Mild discomfort left lower quadrant.Marland Kitchen Hernia: Not present. Diastasis recti: Not present. No hepatomegaly. No splenomegaly.  Genital/Pelvic: Inguinal hernia: Not present. Inguinal lymph nodes: without lymphadenopathy nor hidradenitis.   Rectal: (Deferred)    ###################################################################  Labs, Imaging and Diagnostic Testing:  Located in 'Care Everywhere' section of Epic EMR chart  PRIOR CCS CLINIC NOTES:  Located in [place]' section of Epic EMR chart  SYLVI RYBOLT Appointment: 11/23/2019 11:00 AM Location: [facility] Patient #: [id number] DOB: 12/23/1955 Single / Language: Cleophus Molt / Race:  White Female  History of Present Illness Adin Hector MD; 11/23/2019 1:16 PM) The patient is a 66 year old female who presents with diverticulitis. 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 She comes in a wheelchair with her daughter. His had numerous attacks of left lower quadrant pain and fevers. Has had numerous CAT scans. Diverticulitis strongly suspected and 2012 and 2016. Some chronic thickening suspicious for chronic diverticulitis for the past few years as well. Followed by gastroenterology. Dr. Collene Mares did colonoscopy and flexible sigmoidoscopy in 2019. No cancer or tumor noted. Diverticulosis and probable diverticulitis and some thickening of the sigmoid noted. Patient notes she can walk a block or 2 before she has to stop. She is on 4 L of oxygen. She actually feels like she is breathing better. She is to be followed by Dr. Luan Pulling and since he retired is now under the care of Dr. Melvyn Novas. Denies any heart issues. Apparently he had catheterization many years ago that was underwhelming. She is to be a heavy smoker but has not smoked for the past 4 years. Not diabetic. Has been intermittently on steroids but is not on any oral systemic steroids now. Just inhalers. She notes that she has chronic aching soreness in her left flank and lower abdomen. Often wakes her up at the night. Frustrating. She can recall least 6 severe episodes of  02/07/2022   REFERRING PHYSICIAN: Saverio Danker, MD  Patient Care Team: Casimer Leek, MD as PCP - General (Internal Medicine)  PROVIDER: Hollace Kinnier, MD  [facility] MRN: [id number] DOB: 1955/08/07  SUBJECTIVE   Chief Complaint: Diverticulitis   History of Present Illness: [name] is a 66 y.o. female who is seen today  as an office consultation at the request of Dr. Laural Golden  for evaluation of Diverticulitis .   Is been 2 years since I have seen this patient. Pleasant woman with COPD oxygen dependent. Daughter is a Stage manager. I saw her in 2021 because she had recurrent episodes of diverticulitis. Usually gets care up in the [facility] region. Offered segmental colonic resection in 2021 pending pulmonary and cardiac clearance. I think she try to hold off. However she is having more frequent attacks. She gets some occasional bloating as well. She has numerous smaller bowel movements now. Had repeat colonoscopy by her gastroenterologist with sigmoid stricture noted. Seemed worse. No other evidence of cancer or malignancy. Based on concerns they wish to reconsider surgery.  Patient notes she can walk at home pretty well with her oxygen. However does get short of breath after walking about 5-10 minutes. Followed by by Dr. Halford Chessman. He had seen her a couple months ago noted that she was definitely moderate to high risk with her COPD oxygen dependence but felt that it may need to be decided given her worsening symptoms. Followed by [facility] health cardiology. Dr. Johnny Bridge. She had a decent ejection fraction without any major pulmonary hypertension. He was due to see her for 56-monthI will follow-up in July.  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 She comes in a wheelchair with her daughter. His had numerous attacks of left lower quadrant pain and fevers. Has had numerous CAT scans. Diverticulitis strongly suspected and 2012 and 2016. Some chronic thickening  suspicious for chronic diverticulitis for the past few years as well. Followed by gastroenterology. Dr. MCollene Maresdid colonoscopy and flexible sigmoidoscopy in 2019. No cancer or tumor noted. Diverticulosis and probable diverticulitis and some thickening of the sigmoid noted. Patient notes she can walk a block or 2 before she has to stop. She is on 4 L of oxygen. She actually feels like she is breathing better. She is to be followed by Dr. HLuan Pullingand since he retired is now under the care of Dr. WMelvyn Novas Denies any heart issues. Apparently he had catheterization many years ago that was underwhelming. She is to be a heavy smoker but has not smoked for the past 4 years. Not diabetic. Has been intermittently on steroids but is not on any oral systemic steroids now. Just inhalers. She notes that she has chronic aching soreness in her left flank and lower abdomen. Often wakes her up at the night. Frustrating. She can recall least 6 severe episodes of pain and confirmed diverticulitis but usually is been managed with oral antibiotics. Does not sound like she's ever had a major perforation or need for abscess drainage. She suspects she's had many more attacks. She's had some episodes of fever. Morning concerning episode of pain and fever CAT scan last month showing chronic sigmoid thickening suspicious for diverticulitis. [facility]. Does seem like she has chronic pain in that region now and she cannot get symptom free. CAT scans show evidence of moderate stool burden proximal to chronic sigmoid thickening. Suspicious for sigmoid stricture and constipation.  She did

## 2022-02-07 NOTE — Anesthesia Procedure Notes (Signed)
Procedure Name: Intubation Date/Time: 02/07/2022 7:48 AM  Performed by: Sharlette Dense, CRNAPatient Re-evaluated:Patient Re-evaluated prior to induction Oxygen Delivery Method: Circle system utilized Preoxygenation: Pre-oxygenation with 100% oxygen Induction Type: IV induction Ventilation: Mask ventilation without difficulty and Oral airway inserted - appropriate to patient size Laryngoscope Size: Glidescope and 3 Grade View: Grade I Tube type: Parker flex tip Tube size: 7.0 mm Number of attempts: 2 Airway Equipment and Method: Rigid stylet and Video-laryngoscopy Placement Confirmation: ETT inserted through vocal cords under direct vision, positive ETCO2 and breath sounds checked- equal and bilateral Secured at: 22 cm Tube secured with: Tape Dental Injury: Teeth and Oropharynx as per pre-operative assessment  Difficulty Due To: Difficulty was anticipated, Difficult Airway- due to anterior larynx and Difficult Airway- due to limited oral opening Future Recommendations: Recommend- induction with short-acting agent, and alternative techniques readily available Comments: Attempted first pass with Miller 2.  Unable to visualize glottic opening, just epiglottis.  Transitioned to Glidescope with easy intubation

## 2022-02-07 NOTE — Progress Notes (Signed)
Patient stated that this morning she drank some chicken broth due to having a low blood sugar.  Patient stated that she contacted Dr Clyda Greener office this morning around 0200 am because she felt that she had a drop in her blood sugar.  Patient stated that Dr Johney Maine office told her it was ok for her to drink the chicken broth.  Patient stated that when she uses juice like apple or cranberry juice to increase her blood sugar she has another low sugar after about 30 minutes. Patients blood sugar will be check pre-operatively this morning  Jasmine Pang BSN, Academic librarian - Perioperative Services [facility] [phone]

## 2022-02-08 ENCOUNTER — Encounter (HOSPITAL_COMMUNITY): Payer: Self-pay | Admitting: Surgery

## 2022-02-08 DIAGNOSIS — K56699 Other intestinal obstruction unspecified as to partial versus complete obstruction: Secondary | ICD-10-CM | POA: Diagnosis not present

## 2022-02-08 LAB — CBC
HCT: 26.5 % — ABNORMAL LOW (ref 36.0–46.0)
Hemoglobin: 8.6 g/dL — ABNORMAL LOW (ref 12.0–15.0)
MCH: 31.5 pg (ref 26.0–34.0)
MCHC: 32.5 g/dL (ref 30.0–36.0)
MCV: 97.1 fL (ref 80.0–100.0)
Platelets: 278 10*3/uL (ref 150–400)
RBC: 2.73 MIL/uL — ABNORMAL LOW (ref 3.87–5.11)
RDW: 14.2 % (ref 11.5–15.5)
WBC: 12.4 10*3/uL — ABNORMAL HIGH (ref 4.0–10.5)
nRBC: 0 % (ref 0.0–0.2)

## 2022-02-08 LAB — MAGNESIUM: Magnesium: 1.9 mg/dL (ref 1.7–2.4)

## 2022-02-08 LAB — BASIC METABOLIC PANEL
Anion gap: 6 (ref 5–15)
BUN: 5 mg/dL — ABNORMAL LOW (ref 8–23)
CO2: 30 mmol/L (ref 22–32)
Calcium: 8.2 mg/dL — ABNORMAL LOW (ref 8.9–10.3)
Chloride: 104 mmol/L (ref 98–111)
Creatinine, Ser: 0.51 mg/dL (ref 0.44–1.00)
GFR, Estimated: >60 mL/min (ref >60)
Glucose, Bld: 147 mg/dL — ABNORMAL HIGH (ref 70–99)
Potassium: 3.9 mmol/L (ref 3.5–5.1)
Sodium: 140 mmol/L (ref 135–145)

## 2022-02-08 MED ORDER — DILTIAZEM HCL 30 MG PO TABS
30.0000 mg | ORAL_TABLET | Freq: Four times a day (QID) | ORAL | Status: DC
  Administered 2022-02-08 – 2022-02-11 (×12): 30 mg via ORAL
  Filled 2022-02-08 (×16): qty 1

## 2022-02-08 MED ORDER — HYDROXYZINE HCL 10 MG PO TABS
10.0000 mg | ORAL_TABLET | Freq: Four times a day (QID) | ORAL | Status: DC | PRN
  Administered 2022-02-08 – 2022-02-10 (×9): 10 mg via ORAL
  Filled 2022-02-08 (×15): qty 1

## 2022-02-08 MED ORDER — SODIUM CHLORIDE 0.9 % IV SOLN
125.0000 mg | Freq: Once | INTRAVENOUS | Status: AC
  Administered 2022-02-08: 125 mg via INTRAVENOUS
  Filled 2022-02-08: qty 10

## 2022-02-08 MED ORDER — ORAL CARE MOUTH RINSE
15.0000 mL | OROMUCOSAL | Status: DC
  Administered 2022-02-08 – 2022-02-11 (×12): 15 mL via OROMUCOSAL

## 2022-02-08 MED ORDER — TAB-A-VITE/IRON PO TABS
1.0000 | ORAL_TABLET | Freq: Every day | ORAL | Status: DC
  Administered 2022-02-08 – 2022-02-11 (×4): 1 via ORAL
  Filled 2022-02-08 (×4): qty 1

## 2022-02-08 MED ORDER — ORAL CARE MOUTH RINSE: 15.0000 mL | OROMUCOSAL | Status: DC | PRN

## 2022-02-08 NOTE — TOC Initial Note (Signed)
Transition of Care [facility]) - Initial/Assessment Note    Patient Details  Name: [name] MRN: [id number] Date of Birth: 03-18-1956  Transition of Care [facility]) CM/SW Contact:    Dessa Phi, RN Phone Number: 02/08/2022, 2:32 PM  Clinical Narrative: Monitor for d/c plans.                  Expected Discharge Plan: Home/Self Care Barriers to Discharge: Continued Medical Work up   Patient Goals and CMS Choice        Expected Discharge Plan and Services Expected Discharge Plan: Home/Self Care                                              Prior Living Arrangements/Services                       Activities of Daily Living      Permission Sought/Granted                  Emotional Assessment              Admission diagnosis:  Stricture of sigmoid colon [facility]) [K56.699] Patient Active Problem List   Diagnosis Date Noted   Stricture of sigmoid colon ([place]) 02/07/2022   Gastroesophageal reflux disease 08/07/2021   Solitary pulmonary nodule 09/08/2019   Muscle weakness (generalized)    Chronic respiratory failure with hypoxia and hypercapnia ([place]) 01/21/2015   Macrocytic anemia 01/21/2015   Respiratory failure with hypercapnia ([place]) 11/14/2014   Anemia 11/14/2014   Elevated glucose 05/24/2012   COPD Group D symptoms 12/23/2011   Hyperlipidemia 12/23/2011   PCP:  Asencion Noble, MD Pharmacy:   [facility] [place] [address] Phone: [phone] Fax: [phone]  [facility] [place] [place] [facility] [address] [place] [address] Phone: [phone] Fax: [phone]     Social Determinants of Health (SDOH) Interventions    Readmission Risk Interventions     No data to display

## 2022-02-08 NOTE — Progress Notes (Signed)
NAME:  [name], MRN:  [id number], DOB:  Aug 06, 1955, LOS: 1 ADMISSION DATE:  02/07/2022, CONSULTATION DATE:  02/07/22 REFERRING MD:  Michaell Cowing -- CCS , CHIEF COMPLAINT:  chronic respiratory failure    History of Present Illness:  66 yo F PMH COPD on trelegy/daliresp, chronic hypoxic respiratory failure on 4-7L,OSA on BiPAP 14/6 with bleed-in O2 followed by Dr. 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 During case dense adhesions were identified involving L adnexa, prompting salpingo oophorectomy. 19Fr blake drain placed. EBL , extubated following case.  PCCM in consulted for post-operative medical management in this setting   Pertinent  Medical History   GERD Colon stricture COPD Chronic hypoxic respiratory failure  HLD Anemia   Significant Hospital Events: Including procedures, antibiotic start and stop dates in addition to other pertinent events   10/5 elective sigmoid colon resection, c/b L adnexal adhesions requiring L salpingo oophorectomy. Extubated after case and planned for admission for medical monitoring. PCCM consulted   Interim History / Subjective:  No acute issues overnight  Cardizem 30 q6 started overnight, takes long acting cardizem 240 mg daily at home.  Objective   Blood pressure 113/60, pulse 87, temperature 97.9 F (36.6 C), temperature source Oral, resp. rate 17, height 5\' 3"  (1.6 m), weight 61.8 kg, SpO2 99 %.    FiO2 (%):  [45 %] 45 %   Intake/Output Summary (Last 24 hours) at 02/08/2022 0735 Last data filed at 02/08/2022 0500 Gross per 24 hour  Intake 2505.86 ml  Output 2535 ml  Net -29.14 ml   Filed Weights   02/07/22 0550 02/07/22 1403  Weight: 61.2 kg 61.8 kg    Examination: General appearance: 66 y.o., female, NAD Eyes: PERRL, tracking appropriately HENT: NCAT; MMM Lungs: diminished bl, with normal respiratory effort on BiPAP CV: RRR, no murmur   Abdomen: Soft, appropriately ttp; non-distended, BS hypoactive Extremities: No peripheral edema, warm Skin: Normal turgor and texture; no rash Neuro: Alert, attentive, grossly nonfocal  Labs/imaging reviewed: Hb 8.6 BMP stable  No imaging  Resolved Hospital Problem list    Acute toxic encephalopathy - medication effect  Assessment & Plan:   Acute encephalopathy due to meds -awakening from anesthesia  Severe COPD with emphysema Chronic respiratory failure with hypoxia on 4-7L OSA on BiPAP -when awake, wean O2 support to goal home 4-7L [place], goal o2 88-92% -qHS + naps BiPAP  -IS, mobility  -zyrtec, singulair  -breo, incruse   Sigmoid colon stricture s/p robotic resection with associated L salpingo oophorectomy due to dense adnexa adhesions P -CCS following -can probably dc the art line -- defer to primary team   Normocytic anemia EBL only in surgery, possible dilutional component - trend cbc  Sleep Disorder -PRN QHS trazodone    Best Practice (right click and "Reselect all SmartList Selections" daily)   Per primary  Critical care time: n/a     Laroy Apple Pulmonary/Critical Care  Amion for pager  02/08/2022, 7:35 AM

## 2022-02-08 NOTE — Progress Notes (Signed)
eLink Physician-Brief Progress Note Patient Name: [name] DOB: Feb 15, 1956 MRN: [id number]   Date of Service  02/08/2022  HPI/Events of Note  Patient states that she took her Cardizem CD  240 mg PO Thursday morning prior to surgery. She has a history of Paroxysmal SVT. She only takes the 30 mg dose Q 6 hours PRN increased HR. Present HR = 82 and BP = 109/58 with MAP 75.   eICU Interventions  Plan: Cardizem 30 mg PO Q 6 hours. Start at 8 AM. Hold dose for HR <  60 or SBP < 105.     Intervention Category Major Interventions: Other:  Lysle Dingwall 02/08/2022, 12:47 AM

## 2022-02-08 NOTE — Progress Notes (Addendum)
eLink Physician-Brief Progress Note Patient Name: [name] DOB: 1955-06-20 MRN: [id number]   Date of Service  02/08/2022  HPI/Events of Note  Patient states she can't take ordered Benadryl d/t it causing her to "bounce off walls" and requests home hydroxyzine instead.   eICU Interventions  Plan: D/C Benadryl. Hydroxyzine 10 mg PO Q 6 hours PRN.      Intervention Category Major Interventions: Other:  Sharlee Rufino Cornelia Copa 02/08/2022, 4:27 AM

## 2022-02-09 ENCOUNTER — Other Ambulatory Visit: Payer: Self-pay

## 2022-02-09 DIAGNOSIS — K56699 Other intestinal obstruction unspecified as to partial versus complete obstruction: Secondary | ICD-10-CM | POA: Diagnosis not present

## 2022-02-09 LAB — HEMOGLOBIN: Hemoglobin: 8.2 g/dL — ABNORMAL LOW (ref 12.0–15.0)

## 2022-02-09 LAB — POTASSIUM: Potassium: 3.3 mmol/L — ABNORMAL LOW (ref 3.5–5.1)

## 2022-02-09 LAB — CREATININE, SERUM
Creatinine, Ser: 0.64 mg/dL (ref 0.44–1.00)
GFR, Estimated: >60 mL/min (ref >60)

## 2022-02-09 NOTE — Progress Notes (Signed)
Assessment & Plan: POD#2 - status post robotic low anterior resection, diverticular stricture - 10/5 Dr. Michaell Cowing  Soft diet  Pain controlled  OOB, ambulate  Bipap per pulmonary        Cynthia Level, MD [facility] practice Office: [phone]        Chief Complaint: Complex diverticular disease  Subjective: Patient in bed, on Bipap.  Comfortable.  Taking dysphagia diet.  BM yesterday.  Objective: Vital signs in last 24 hours: Temp:  [97.5 F (36.4 C)-98.1 F (36.7 C)] 98.1 F (36.7 C) (10/07 0502) [name]:  [82-101] 82 (10/07 0502) Resp:  [17-34] 18 (10/07 0502) BP: (102-149)/(58-75) 104/69 (10/07 0502) SpO2:  [95 %-100 %] 100 % (10/07 0502) Weight:  [62.5 kg] 62.5 kg (10/07 0502) Last BM Date : 02/08/22  Intake/Output from previous day: 10/06 0701 - 10/07 0700 In: 964.3 [P.O.:120; I.V.:734.3; IV Piggyback:110] Out: 855 [Urine:750; Drains:105] Intake/Output this shift: No intake/output data recorded.  Physical Exam: HEENT - sclerae clear, mucous membranes moist Neck - soft Abdomen - soft, mild distension; wounds dry and intact; JP drain with thin serosanguinous Ext - no edema, non-tender Neuro - alert & oriented, no focal deficits  Lab Results:  Recent Labs    02/07/22 1003 02/08/22 0409 02/09/22 0500  WBC  --  12.4*  --   HGB 9.5* 8.6* 8.2*  HCT 28.0* 26.5*  --   PLT  --  278  --    BMET Recent Labs    02/07/22 1003 02/08/22 0409 02/09/22 0500  NA 139 140  --   K 2.8* 3.9 3.3*  CL  --  104  --   CO2  --  30  --   GLUCOSE  --  147*  --   BUN  --  5*  --   CREATININE  --  0.51 0.64  CALCIUM  --  8.2*  --    PT/INR No results for input(s): "LABPROT", "INR" in the last 72 hours. Comprehensive Metabolic Panel:    Component Value Date/Time   NA 140 02/08/2022 0409   NA 139 02/07/2022 1003   K 3.3 (L) 02/09/2022 0500   K 3.9 02/08/2022 0409   CL 104 02/08/2022 0409   CL 103 01/25/2022 1144   CO2 30 02/08/2022  0409   CO2 28 01/25/2022 1144   BUN 5 (L) 02/08/2022 0409   BUN 10 01/25/2022 1144   CREATININE 0.64 02/09/2022 0500   CREATININE 0.51 02/08/2022 0409   CREATININE 0.83 03/08/2011 0956   GLUCOSE 147 (H) 02/08/2022 0409   GLUCOSE 115 (H) 01/25/2022 1144   CALCIUM 8.2 (L) 02/08/2022 0409   CALCIUM 8.8 (L) 01/25/2022 1144   AST 14 (L) 11/01/2019 1848   AST 24 09/11/2016 1245   ALT 14 11/01/2019 1848   ALT 21 09/11/2016 1245   ALKPHOS 119 11/01/2019 1848   ALKPHOS 100 09/11/2016 1245   BILITOT 0.6 11/01/2019 1848   BILITOT 0.3 09/11/2016 1245   PROT 7.9 11/01/2019 1848   PROT 6.7 09/11/2016 1245   ALBUMIN 4.1 11/01/2019 1848   ALBUMIN 3.8 09/11/2016 1245    Studies/Results: No results found.    [name] 02/09/2022   Patient ID: [name], female   DOB: Jul 18, 1955, 66 y.o.   MRN: [id number]

## 2022-02-09 NOTE — Progress Notes (Signed)
Mobility Specialist - Progress Note  (4L O2) Pre-mobility: 97% SpO2 During mobility: 90% SpO2 Post-mobility: 96% SPO2   02/09/22 0939  Mobility  Activity Ambulated with assistance in hallway  Activity Response Tolerated well  Distance Ambulated (ft) 180 ft  $Mobility charge 1 Mobility  Level of Assistance Standby assist, set-up cues, supervision of patient - no hands on  Assistive Device Front wheel walker  HOB Elevated/Bed Position Self regulated  Range of Motion/Exercises Active   Pt was found in bed and agreeable to ambulate. Pt stated feeling a little lightheaded while ambulating and became fatigued by the EOS. Upon returning to room was left sitting EOB with all necessities in reach.  Ferd Hibbs Mobility Specialist

## 2022-02-09 NOTE — Progress Notes (Signed)
NAME:  [name], MRN:  [id number], DOB:  [birth year], LOS: 2 ADMISSION DATE:  02/07/2022, CONSULTATION DATE:  02/07/22 REFERRING MD:  Michaell Cowing -- CCS , CHIEF COMPLAINT:  chronic respiratory failure    History of Present Illness:  66 yo F PMH COPD on trelegy/daliresp, chronic hypoxic respiratory failure on 4-7L,OSA on BiPAP 14/6 with bleed-in O2 followed by 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 During case dense adhesions were identified involving L adnexa, prompting salpingo oophorectomy. 19Fr blake drain placed. EBL , extubated following case.  PCCM in consulted for post-operative medical management in this setting   Pertinent  Medical History   GERD Colon stricture COPD Chronic hypoxic respiratory failure  HLD Anemia   Significant Hospital Events: Including procedures, antibiotic start and stop dates in addition to other pertinent events   10/5 elective sigmoid colon resection, c/b L adnexal adhesions requiring L salpingo oophorectomy. Extubated after case and planned for admission for medical monitoring. PCCM consulted   Interim History / Subjective:  No acute issues overnight just didn't sleep much, tolerating pureed diet.  Objective   Blood pressure 104/69, pulse 82, temperature 98.1 F (36.7 C), temperature source Oral, resp. rate 18, height 5\' 3"  (1.6 m), weight 62.5 kg, SpO2 100 %.        Intake/Output Summary (Last 24 hours) at 02/09/2022 0847 Last data filed at 02/09/2022 9528 Gross per 24 hour  Intake 384.52 ml  Output 855 ml  Net -470.48 ml   Filed Weights   02/07/22 0550 02/07/22 1403 02/09/22 0502  Weight: 61.2 kg 61.8 kg 62.5 kg    Examination: General appearance: 66 y.o., female, NAD, conversant  Eyes: tracking appropriately HENT: NCAT; MMM Neck: Trachea midline; no lymphadenopathy, no JVD Lungs: CTAB, no crackles, no wheeze, with normal respiratory  effort CV: RRR, no murmur  Abdomen: Soft, appropriately ttp; non-distended, BS present  Extremities: No peripheral edema, warm Skin: Normal turgor and texture; no rash Neuro: Attentive, grossly nonfocal   Labs/imaging reviewed: No new labs  No new imaging  Resolved Hospital Problem list    Acute toxic encephalopathy - medication effect  Assessment & Plan:   Severe COPD with emphysema Chronic respiratory failure with hypoxia on 4-7L OSA on BiPAP -wean O2 support to goal home 4-7L [place], goal o2 88-92% -BiPAP at night and with naps -IS, mobility -zyrtec, singulair  -breo, incruse  -has follow up with [name] 03/11/22  Sigmoid colon stricture s/p robotic resection with associated L salpingo oophorectomy due to dense adnexa adhesions P -CCS following  Normocytic anemia EBL only in surgery, possible dilutional component - ctm  Sleep Disorder -PRN QHS trazodone   Will sign off but glad to be reinvolved as condition changes   Best Practice (right click and "Reselect all SmartList Selections" daily)   Per primary  Critical care time: n/a     Laroy Apple Pulmonary/Critical Care  Amion for pager  02/09/2022, 8:47 AM

## 2022-02-10 LAB — HEMOGLOBIN: Hemoglobin: 9.4 g/dL — ABNORMAL LOW (ref 12.0–15.0)

## 2022-02-10 MED ORDER — IBUPROFEN 400 MG PO TABS
400.0000 mg | ORAL_TABLET | Freq: Four times a day (QID) | ORAL | Status: DC | PRN
  Administered 2022-02-10: 400 mg via ORAL
  Filled 2022-02-10: qty 1

## 2022-02-10 MED ORDER — HYDROCORT-PRAMOXINE (PERIANAL) 2.5-1 % EX CREA
TOPICAL_CREAM | Freq: Four times a day (QID) | CUTANEOUS | Status: DC | PRN
  Filled 2022-02-10: qty 30

## 2022-02-10 NOTE — Progress Notes (Signed)
Pt has itchiness from the Telemetry electrodes, redness on the skin was noted. Pt requested for Tele to be DC. On-call surgery Dr. Sherren Mocha was informed and call to have it DC. Ibuprofen PO 400q6 was ordered too.

## 2022-02-10 NOTE — Progress Notes (Signed)
Patient has home BiPAP machine at bedside. Patient stated she didn't need any assistance and would place on herself when ready.

## 2022-02-10 NOTE — Progress Notes (Signed)
Assessment & Plan: POD#3 - status post robotic low anterior resection, diverticular stricture - 10/5 Dr. Michaell Cowing             Regular diet at patient request             Pain controlled             OOB, ambulate             Bipap per pulmonary  Anticipate discharge home tomorrow.        Darnell Level, MD [facility] practice Office: [phone]        Chief Complaint: Diverticular disease  Subjective: Patient in bed, pain controlled. Slept well.  Would like regular diet.  Objective: Vital signs in last 24 hours: Temp:  [97.8 F (36.6 C)-98.2 F (36.8 C)] 98.2 F (36.8 C) (10/08 0516) [name]:  [90-109] 109 (10/08 0516) Resp:  [18-20] 20 (10/08 0516) BP: (97-143)/(57-65) 143/65 (10/08 0516) SpO2:  [98 %-100 %] 100 % (10/08 0516) Last BM Date : 02/09/22  Intake/Output from previous day: 10/07 0701 - 10/08 0700 In: 120 [P.O.:120] Out: 75 [Drains:75] Intake/Output this shift: No intake/output data recorded.  Physical Exam: HEENT - sclerae clear, mucous membranes moist Neck - soft Abdomen - soft, BS present; wounds dry and intact; JP with serosanguinous, thin Ext - no edema, non-tender Neuro - alert & oriented, no focal deficits  Lab Results:  Recent Labs    02/07/22 1003 02/08/22 0409 02/09/22 0500 02/10/22 0504  WBC  --  12.4*  --   --   HGB 9.5* 8.6* 8.2* 9.4*  HCT 28.0* 26.5*  --   --   PLT  --  278  --   --    BMET Recent Labs    02/07/22 1003 02/08/22 0409 02/09/22 0500  NA 139 140  --   K 2.8* 3.9 3.3*  CL  --  104  --   CO2  --  30  --   GLUCOSE  --  147*  --   BUN  --  5*  --   CREATININE  --  0.51 0.64  CALCIUM  --  8.2*  --    PT/INR No results for input(s): "LABPROT", "INR" in the last 72 hours. Comprehensive Metabolic Panel:    Component Value Date/Time   NA 140 02/08/2022 0409   NA 139 02/07/2022 1003   K 3.3 (L) 02/09/2022 0500   K 3.9 02/08/2022 0409   CL 104 02/08/2022 0409   CL 103  01/25/2022 1144   CO2 30 02/08/2022 0409   CO2 28 01/25/2022 1144   BUN 5 (L) 02/08/2022 0409   BUN 10 01/25/2022 1144   CREATININE 0.64 02/09/2022 0500   CREATININE 0.51 02/08/2022 0409   CREATININE 0.83 03/08/2011 0956   GLUCOSE 147 (H) 02/08/2022 0409   GLUCOSE 115 (H) 01/25/2022 1144   CALCIUM 8.2 (L) 02/08/2022 0409   CALCIUM 8.8 (L) 01/25/2022 1144   AST 14 (L) 11/01/2019 1848   AST 24 09/11/2016 1245   ALT 14 11/01/2019 1848   ALT 21 09/11/2016 1245   ALKPHOS 119 11/01/2019 1848   ALKPHOS 100 09/11/2016 1245   BILITOT 0.6 11/01/2019 1848   BILITOT 0.3 09/11/2016 1245   PROT 7.9 11/01/2019 1848   PROT 6.7 09/11/2016 1245   ALBUMIN 4.1 11/01/2019 1848   ALBUMIN 3.8 09/11/2016 1245    Studies/Results: No results found.    Darnell Level 02/10/2022   Patient  ID: [name], female   DOB: 1956-01-13, 66 y.o.   MRN: [id number]

## 2022-02-11 DIAGNOSIS — K644 Residual hemorrhoidal skin tags: Secondary | ICD-10-CM | POA: Insufficient documentation

## 2022-02-11 LAB — SURGICAL PATHOLOGY

## 2022-02-11 LAB — HEMOGLOBIN: Hemoglobin: 8.2 g/dL — ABNORMAL LOW (ref 12.0–15.0)

## 2022-02-11 MED ORDER — TRAMADOL HCL 50 MG PO TABS: 50.0000 mg | ORAL_TABLET | Freq: Four times a day (QID) | ORAL | 0 refills | Status: DC | PRN

## 2022-02-11 NOTE — TOC Transition Note (Signed)
Transition of Care [facility]) - CM/SW Discharge Note   Patient Details  Name: [name] MRN: [id number] Date of Birth: 1955/09/09  Transition of Care [facility]) CM/SW Contact:  Ross Ludwig, LCSW Phone Number: 02/11/2022, 1:29 PM   Clinical Narrative:      Transition of Care [facility]) Screening Note   Patient Details  Name: [name] Date of Birth: 07/31/1955   Transition of Care [facility]) CM/SW Contact:    Ross Ludwig, LCSW Phone Number: 02/11/2022, 1:29 PM    Transition of Care Department [facility]) has reviewed patient and no TOC needs have been identified at this time. We will continue to monitor patient advancement through interdisciplinary progression rounds. If new patient transition needs arise, please place a TOC consult.     Final next level of care: Home/Self Care Barriers to Discharge: Continued Medical Work up   Patient Goals and CMS Choice        Discharge Placement                       Discharge Plan and Services                                     Social Determinants of Health (SDOH) Interventions     Readmission Risk Interventions     No data to display

## 2022-02-11 NOTE — Discharge Summary (Signed)
Physician Discharge Summary    Patient ID: [name] MRN: [id number] DOB/AGE: 11-Feb-1956  66 y.o.  Patient Care Team: Carylon Perches, MD as PCP - General (Internal Medicine) Jonelle Sidle, MD as PCP - Cardiology (Cardiology) Comer, Belia Heman, MD as Consulting Physician (Infectious Diseases) Malissa Hippo, MD as Consulting Physician (Gastroenterology) Karie Soda, MD as Consulting Physician (General Surgery) Coralyn Helling, MD as Consulting Physician (Pulmonary Disease)  Admit date: 02/07/2022  Discharge date: 02/11/2022  Hospital Stay = 4 days    Discharge Diagnoses:  Principal Problem:   Stricture of sigmoid colon [facility]) Active Problems:   COPD Group D symptoms   Chronic respiratory failure with hypoxia and hypercapnia (HCC)   Macrocytic anemia   Gastroesophageal reflux disease   Supplemental oxygen dependent   Diverticulitis of large intestine without perforation or abscess without bleeding   External hemorrhoids with complication   4 Days Post-Op  02/07/2022  POST-OPERATIVE DIAGNOSIS:  STRICTURE OF COLON   PROCEDURE:   ROBOTIC LOW ANTERIOR RECTOSIGMOID RESECTION LEFT SALPINGO OOPHORECTOMY TRANSVERSUS ABDOMINIS PLANE (TAP) BLOCK - BILATERAL INTRAOPERATIVE ASSESSMENT OF PERFUSION USING FIREFLY RIGID PROCTOSCOPY   SURGEON:  Ardeth Sportsman, MD  OR FINDINGS:    Patient had thickened distal sigmoid/rectosigmoid region.  Dense adhesions and involvement with left adnexa.  En bloc salpingo-oophorectomy done.  No obvious metastatic disease on visceral parietal peritoneum or liver.   The anastomosis rests 11 cm from the anal verge by rigid proctoscopy.  It is a 29 EEA distal descending to proximal rectum anastomosis    SURGEON:    Surgeon(s): Karie Soda, MD Andria Meuse, MD Heloise Purpura, MD  Consults: Physical Therapy, Occupational Therapy, Pharmacy, Anesthesia, and Pulmonology  Hospital Course:   The patient underwent  the surgery above.   His moderate to stepdown unit and placed on her usual BiPAP and oxygenation.  Pulmonary critical care followed in consultation given her significant COPD.  She stabilized, transferred to floor on postop day 1. Postoperatively, the patient gradually mobilized and advanced to a solid diet.  Pain and other symptoms were treated aggressively.    By the time of discharge, the patient was walking well the hallways, eating food, having flatus.  Pain was well-controlled on an oral medications.  Based on meeting discharge criteria and continuing to recover, I felt it was safe for the patient to be discharged from the hospital to further recover with close followup. Postoperative recommendations were discussed in detail.  They are written as well.  Discharged Condition: good  Discharge Exam: Blood pressure 116/65, pulse (!) 110, temperature 97.7 F (36.5 C), temperature source Oral, resp. rate 16, height 5\' 3"  (1.6 m), weight 62.2 kg, SpO2 99 %.  General: Pt awake/alert/oriented x4 in No acute distress.  Toxic.  Not sickly.  Moving without guarding or hesitancy. Eyes: PERRL, normal EOM.  Sclera clear.  No icterus Neuro: CN II-XII intact w/o focal sensory/motor deficits. Lymph: No head/neck/groin lymphadenopathy Psych:  No delerium/psychosis/paranoia HENT: Normocephalic, Mucus membranes moist.  No thrush Neck: Supple, No tracheal deviation Chest:  No chest wall pain w good excursion.  BiPAP.  Talking without conversational dyspnea with mask off.  In good spirits. CV:  Pulses intact.  Regular rhythm MS: Normal AROM mjr joints.  No obvious deformity Abdomen: Soft.  Nondistended.  Nontender.  No evidence of peritonitis.  No incarcerated hernias. Ext:  SCDs BLE.  No mjr edema.  No cyanosis Skin: No petechiae / purpura   Disposition:    Follow-up Information  Karie Soda, MD Follow up in 3 week(s).   Specialties: General Surgery, Colon and Rectal Surgery Why: To follow up after your operation,  To follow up after your hospital stay Contact information: [address] [place] [address] [phone]                 Discharge disposition: 01-Home or Self Care       Discharge Instructions     Call MD for:   Complete by: As directed    FEVER > 101.5 F  (temperatures < 101.5 F are not significant)   Call MD for:  extreme fatigue   Complete by: As directed    Call MD for:  persistant dizziness or light-headedness   Complete by: As directed    Call MD for:  persistant nausea and vomiting   Complete by: As directed    Call MD for:  redness, tenderness, or signs of infection (pain, swelling, redness, odor or green/yellow discharge around incision site)   Complete by: As directed    Call MD for:  severe uncontrolled pain   Complete by: As directed    Diet - low sodium heart healthy   Complete by: As directed    Start with a bland diet such as soups, liquids, starchy foods, low fat foods, etc. the first few days at home. Gradually advance to a solid, low-fat, high fiber diet by the end of the first week at home.   Add a fiber supplement to your diet (Metamucil, etc) If you feel full, bloated, or constipated, stay on a full liquid or pureed/blenderized diet for a few days until you feel better and are no longer constipated.   Discharge instructions   Complete by: As directed    See Discharge Instructions If you are not getting better after two weeks or are noticing you are getting worse, contact our office [phone] for further advice.  We may need to adjust your medications, re-evaluate you in the office, send you to the emergency room, or see what other things we can do to help. The clinic staff is available to answer your questions during regular business hours (8:30am-5pm).  Please don't hesitate to call and ask to speak to one of our nurses for clinical concerns.    A surgeon from [facility] is always on call at the hospitals 24  hours/day If you have a medical emergency, go to the nearest emergency room or call 911.   Discharge wound care:   Complete by: As directed    It is good for closed incisions and even open wounds to be washed every day.  Shower every day.  Short baths are fine.  Wash the incisions and wounds clean with soap & water.    You may leave closed incisions open to air if it is dry.   You may cover the incision with clean gauze & replace it after your daily shower for comfort.  TEGADERM:  You have clear gauze band-aid dressings over your closed incision(s).  Remove the dressings 3 days after surgery = SUNDAY 10/8   Driving Restrictions   Complete by: As directed    You may drive when: - you are no longer taking narcotic prescription pain medication - you can comfortably wear a seatbelt - you can safely make sudden turns/stops without pain.   Increase activity slowly   Complete by: As directed    Start light daily activities --- self-care, walking, climbing stairs- beginning  the day after surgery.  Gradually increase activities as tolerated.  Control your pain to be active.  Stop when you are tired.  Ideally, walk several times a day, eventually an hour a day.   Most people are back to most day-to-day activities in a few weeks.  It takes 4-6 weeks to get back to unrestricted, intense activity. If you can walk 30 minutes without difficulty, it is safe to try more intense activity such as jogging, treadmill, bicycling, low-impact aerobics, swimming, etc. Save the most intensive and strenuous activity for last (Usually 4-8 weeks after surgery) such as sit-ups, heavy lifting, contact sports, etc.  Refrain from any intense heavy lifting or straining until you are off narcotics for pain control.  You will have off days, but things should improve week-by-week. DO NOT PUSH THROUGH PAIN.  Let pain be your guide: If it hurts to do something, don't do it.   Lifting restrictions   Complete by: As directed    If  you can walk 30 minutes without difficulty, it is safe to try more intense activity such as jogging, treadmill, bicycling, low-impact aerobics, swimming, etc. Save the most intensive and strenuous activity for last (Usually 4-8 weeks after surgery) such as sit-ups, heavy lifting, contact sports, etc.   Refrain from any intense heavy lifting or straining until you are off narcotics for pain control.  You will have off days, but things should improve week-by-week. DO NOT PUSH THROUGH PAIN.  Let pain be your guide: If it hurts to do something, don't do it.  Pain is your body warning you to avoid that activity for another week until the pain goes down.   May shower / Bathe   Complete by: As directed    May walk up steps   Complete by: As directed    Remove dressing in 72 hours   Complete by: As directed    Make sure all dressings are removed by the third day after surgery.  Leave incisions open to air.  OK to cover incisions with gauze or bandages as desired   Sexual Activity Restrictions   Complete by: As directed    You may have sexual intercourse when it is comfortable. If it hurts to do something, stop.       Allergies as of 02/11/2022       Reactions   Iohexol Anaphylaxis, Other (See Comments)    Desc: IV DYE  ANAPHYLATIC SHOCK X2 ONCE AFTER PREMEDS   Adhesive [tape] Itching, Other (See Comments)   Break out   Ketek [telithromycin] Other (See Comments)   Loopy, eyes went different directions   Penicillins Other (See Comments)   Unknown Has patient had a PCN reaction causing immediate rash, facial/tongue/throat swelling, SOB or lightheadedness with hypotension: Unknown Has patient had a PCN reaction causing severe rash involving mucus membranes or skin necrosis: Unknown Has patient had a PCN reaction that required hospitalization: Unknown Has patient had a PCN reaction occurring within the last 10 years: No If all of the above answers are "NO", then may proceed with Cephalosporin use.    Chantix [varenicline] Palpitations        Medication List     STOP taking these medications    HYDROcodone-acetaminophen 5-325 MG tablet Commonly known as: NORCO/VICODIN   metroNIDAZOLE 500 MG tablet Commonly known as: FLAGYL       TAKE these medications    albuterol 108 (90 Base) MCG/ACT inhaler Commonly known as: VENTOLIN HFA Inhale 1-2 puffs into the  lungs every 6 (six) hours as needed for wheezing or shortness of breath.   ALPRAZolam 0.25 MG tablet Commonly known as: XANAX Take 0.25 mg by mouth 3 (three) times daily as needed for anxiety.   aspirin 81 MG tablet Take 1 tablet (81 mg total) by mouth daily.   atorvastatin 40 MG tablet Commonly known as: LIPITOR TAKE 1 TABLET DAILY What changed: when to take this   azelastine 0.1 % nasal spray Commonly known as: ASTELIN Place 1 spray into both nostrils daily as needed for rhinitis or allergies. Use in each nostril as directed   carboxymethylcellulose 1 % ophthalmic solution Place 2 drops into both eyes 3 (three) times daily as needed (for dry eyes).   cetirizine 10 MG tablet Commonly known as: ZYRTEC Take 10 mg by mouth daily.   clonazePAM 0.5 MG tablet Commonly known as: KLONOPIN Take 1 tablet (0.5 mg total) by mouth at bedtime. What changed:  when to take this reasons to take this   cyclobenzaprine 10 MG tablet Commonly known as: FLEXERIL Take 10 mg by mouth 2 (two) times daily as needed for muscle spasms.   diclofenac Sodium 1 % Gel Commonly known as: VOLTAREN Apply 2 g topically 2 (two) times daily as needed (pain).   dicyclomine 10 MG capsule Commonly known as: BENTYL Take 1 capsule (10 mg total) by mouth 3 (three) times daily as needed for spasms.   diltiazem 240 MG 24 hr capsule Commonly known as: CARDIZEM CD TAKE 1 CAPSULE DAILY   diltiazem 30 MG tablet Commonly known as: Cardizem Take 1-2 tablets every 6 hours as needed for persistent tachycardai   escitalopram 20 MG  tablet Commonly known as: LEXAPRO Take 20 mg by mouth daily.   fluticasone 50 MCG/ACT nasal spray Commonly known as: FLONASE Place 1 spray into both nostrils daily as needed for allergies.   hydrOXYzine 25 MG tablet Commonly known as: ATARAX Take 12.5 mg by mouth every 6 (six) hours as needed for itching.   ibuprofen 200 MG tablet Commonly known as: ADVIL Take 400 mg by mouth every 6 (six) hours as needed for moderate pain.   lansoprazole 15 MG capsule Commonly known as: Prevacid Take 1 capsule (15 mg total) by mouth 2 (two) times daily before a meal. What changed: when to take this   montelukast 10 MG tablet Commonly known as: SINGULAIR Take 1 tablet (10 mg total) by mouth at bedtime.   NON FORMULARY Pt uses a bipap with oxygen when laying down   oxybutynin 5 MG tablet Commonly known as: DITROPAN Take 5 mg by mouth daily.   OXYGEN Inhale 4 L into the lungs continuous.   roflumilast 500 MCG Tabs tablet Commonly known as: DALIRESP Take 500 mcg by mouth daily.   traMADol 50 MG tablet Commonly known as: ULTRAM Take 1 tablet (50 mg total) by mouth every 6 (six) hours as needed for moderate pain.   traZODone 50 MG tablet Commonly known as: DESYREL Take 50 mg by mouth at bedtime as needed for sleep.   Trelegy Ellipta 100-62.5-25 MCG/ACT Aepb Generic drug: Fluticasone-Umeclidin-Vilant Take 1 puff by mouth daily.   valACYclovir 500 MG tablet Commonly known as: VALTREX Take 500 mg by mouth daily.   valACYclovir 1000 MG tablet Commonly known as: VALTREX Take 1,000 mg by mouth daily as needed (outbreaks).               Discharge Care Instructions  (From admission, onward)  Start     Ordered   02/07/22 0000  Discharge wound care:       Comments: It is good for closed incisions and even open wounds to be washed every day.  Shower every day.  Short baths are fine.  Wash the incisions and wounds clean with soap & water.    You may leave closed  incisions open to air if it is dry.   You may cover the incision with clean gauze & replace it after your daily shower for comfort.  TEGADERM:  You have clear gauze band-aid dressings over your closed incision(s).  Remove the dressings 3 days after surgery = SUNDAY 10/8   02/07/22 0805            Significant Diagnostic Studies:  Results for orders placed or performed during the hospital encounter of 02/07/22 (from the past 72 hour(s))  Hemoglobin     Status: Abnormal   Collection Time: 02/09/22  5:00 AM  Result Value Ref Range   Hemoglobin 8.2 (L) 12.0 - 15.0 g/dL    Comment: Performed at [facility], [address] [place], [place] [address]  Potassium     Status: Abnormal   Collection Time: 02/09/22  5:00 AM  Result Value Ref Range   Potassium 3.3 (L) 3.5 - 5.1 mmol/L    Comment: Performed at [facility], [address] [place], [place] [address]  Creatinine, serum     Status: None   Collection Time: 02/09/22  5:00 AM  Result Value Ref Range   Creatinine, Ser 0.64 0.44 - 1.00 mg/dL   GFR, Estimated >82 >95 mL/min    Comment: (NOTE) Calculated using the CKD-EPI Creatinine Equation (2021) Performed at [facility], [address] [place], [place] [address]   Hemoglobin     Status: Abnormal   Collection Time: 02/10/22  5:04 AM  Result Value Ref Range   Hemoglobin 9.4 (L) 12.0 - 15.0 g/dL    Comment: Performed at [facility], [address], [place], [place] [address]  Hemoglobin     Status: Abnormal   Collection Time: 02/11/22  4:02 AM  Result Value Ref Range   Hemoglobin 8.2 (L) 12.0 - 15.0 g/dL    Comment: Performed at [facility], [address] [place], [place] [address]    No results found.  Past Medical History:  Diagnosis Date   Allergy    Anxiety    Arthritis    COPD (chronic obstructive pulmonary disease) (HCC)    Home oxygen at [facility]   Depression     Difficult intubation 02/07/2022   Diverticulosis    Dyspnea    Dysrhythmia    History of cardiac catheterization    No significant CAD 2005   Hyperlipidemia    Hypoglycemia    IBS (irritable bowel syndrome)    Neuromuscular disorder (HCC)    Osteoporosis    Paroxysmal SVT (supraventricular tachycardia)    Pneumonia 2015   Pre-diabetes    Shingles 2012   Sleep apnea     Past Surgical History:  Procedure Laterality Date   BACK SURGERY     BIOPSY  09/12/2021   Procedure: BIOPSY;  Surgeon: Malissa Hippo, MD;  [facility];  Service: Endoscopy;;   BREAST SURGERY     CARDIAC CATHETERIZATION  2004   COLONOSCOPY  03/22/2011   Procedure: COLONOSCOPY;  Surgeon: Malissa Hippo, MD;  [facility];  Service:  Endoscopy;  Laterality: N/A;   COLONOSCOPY WITH PROPOFOL N/A 05/16/2017   Procedure: COLONOSCOPY WITH PROPOFOL;  Surgeon: Malissa Hippo, MD;  [facility];  Service: Endoscopy;  Laterality: N/A;   COLONOSCOPY WITH PROPOFOL N/A 09/12/2021   Procedure: COLONOSCOPY WITH PROPOFOL;  Surgeon: Malissa Hippo, MD;  [facility];  Service: Endoscopy;  Laterality: N/A;  1230 ASA 2   ESOPHAGEAL DILATION N/A 04/19/2020   Procedure: ESOPHAGEAL DILATION;  Surgeon: Malissa Hippo, MD;  [facility];  Service: Endoscopy;  Laterality: N/A;   ESOPHAGOGASTRODUODENOSCOPY (EGD) WITH PROPOFOL N/A 04/19/2020   Procedure: ESOPHAGOGASTRODUODENOSCOPY (EGD) WITH PROPOFOL;  Surgeon: Malissa Hippo, MD;  [facility];  Service: Endoscopy;  Laterality: N/A;  9:15   ESOPHAGOGASTRODUODENOSCOPY (EGD) WITH PROPOFOL N/A 09/12/2021   Procedure: ESOPHAGOGASTRODUODENOSCOPY (EGD) WITH PROPOFOL;  Surgeon: Malissa Hippo, MD;  [facility];  Service: Endoscopy;  Laterality: N/A;   FLEXIBLE SIGMOIDOSCOPY N/A 09/26/2017   Procedure: FLEXIBLE SIGMOIDOSCOPY with Propofol ;  Surgeon: Malissa Hippo, MD;  [facility];  Service:  Endoscopy;  Laterality: N/A;  9:30   OSTOMY N/A 02/07/2022   Procedure: POSSIBLE OSTOMY;  Surgeon: Karie Soda, MD;  [facility]: [facility];  Service: General;  Laterality: N/A;   POLYPECTOMY  05/16/2017   Procedure: POLYPECTOMY;  Surgeon: Malissa Hippo, MD;  [facility];  Service: Endoscopy;;   POLYPECTOMY  09/12/2021   Procedure: POLYPECTOMY INTESTINAL;  Surgeon: Malissa Hippo, MD;  [facility];  Service: Endoscopy;;   PROCTOSCOPY N/A 02/07/2022   Procedure: RIGID PROCTOSCOPY;  Surgeon: Karie Soda, MD;  Location: [facility];  Service: General;  Laterality: N/A;   SPINAL FUSION     VULVECTOMY      Social History   Socioeconomic History   Marital status: Divorced    Spouse name: Not on file   Number of children: Not on file   Years of education: Not on file   Highest education level: Not on file  Occupational History   Not on file  Tobacco Use   Smoking status: Former    Packs/day: 1.00    Years: 40.00    Total pack years: 40.00    Types: Cigarettes    Quit date: 05/07/2015    Years since quitting: 6.7    Passive exposure: Past   Smokeless tobacco: Never  Vaping Use   Vaping Use: Former  Substance and Sexual Activity   Alcohol use: Yes    Comment: 1 -2 glasses of wine daily   Drug use: No   Sexual activity: Never  Other Topics Concern   Not on file  Social History Narrative   Daughter is MD Judeth Cornfield Janne Napoleon, radiologist in [place])   Social Determinants of Health   Financial Resource Strain: Not on file  Food Insecurity: No Food Insecurity (02/09/2022)   Hunger Vital Sign    Worried About Running Out of Food in the Last Year: Never true    Ran Out of Food in the Last Year: Never true  Transportation Needs: No Transportation Needs (02/09/2022)   PRAPARE - Administrator, Civil Service (Medical): No    Lack of Transportation (Non-Medical): No  Physical Activity: Not on file  Stress: Not on file  Social Connections:  Not on file  Intimate Partner Violence: Not At Risk (02/09/2022)   Humiliation, Afraid, Rape, and Kick questionnaire    Fear of Current or Ex-Partner: No  Emotionally Abused: No    Physically Abused: No    Sexually Abused: No    Family History  Problem Relation Age of Onset   Asthma Mother    Heart attack Mother    Diabetes Mother    Heart failure Mother    Colon cancer Neg Hx     Current Facility-Administered Medications  Medication Dose Route Frequency Provider Last Rate Last Admin   0.9 %  sodium chloride infusion  250 mL Intravenous PRN Karie Soda, MD   Stopped at 02/08/22 1326   acetaminophen (TYLENOL) tablet 1,000 mg  1,000 mg Oral Q6H PRN Karie Soda, MD   1,000 mg at 02/07/22 2130   albuterol (PROVENTIL) (2.5 MG/3ML) 0.083% nebulizer solution 3 mL  3 mL Inhalation Q6H PRN Karie Soda, MD       ALPRAZolam Prudy Feeler) tablet 0.25 mg  0.25 mg Oral TID PRN Karie Soda, MD       alum & mag hydroxide-simeth (MAALOX/MYLANTA) 200-200-20 MG/5ML suspension 30 mL  30 mL Oral Q6H PRN Karie Soda, MD       atorvastatin (LIPITOR) tablet 40 mg  40 mg Oral Laurena Slimmer, MD   40 mg at 02/10/22 2146   azelastine (ASTELIN) 0.1 % nasal spray 1 spray  1 spray Each Nare Daily PRN Karie Soda, MD       Chlorhexidine Gluconate Cloth 2 % PADS 6 each  6 each Topical Daily Karie Soda, MD   6 each at 02/10/22 0910   clonazePAM (KLONOPIN) tablet 0.5 mg  0.5 mg Oral QHS PRN Karie Soda, MD       diltiazem (CARDIZEM) tablet 30 mg  30 mg Oral Q6H Karie Soda, MD   30 mg at 02/11/22 1114   enalaprilat (VASOTEC) injection 0.625-1.25 mg  0.625-1.25 mg Intravenous Q6H PRN Karie Soda, MD       enoxaparin (LOVENOX) injection 40 mg  40 mg Subcutaneous Q24H Karie Soda, MD   40 mg at 02/11/22 0814   escitalopram (LEXAPRO) tablet 20 mg  20 mg Oral Daily Karie Soda, MD   20 mg at 02/11/22 1042   feeding supplement (ENSURE SURGERY) liquid 237 mL  237 mL Oral BID BM Karie Soda, MD   237  mL at 02/11/22 1043   fluticasone (FLONASE) 50 MCG/ACT nasal spray 1 spray  1 spray Each Nare Daily PRN Karie Soda, MD       fluticasone furoate-vilanterol (BREO ELLIPTA) 100-25 MCG/ACT 1 puff  1 puff Inhalation Daily Karie Soda, MD   1 puff at 02/11/22 0928   And   umeclidinium bromide (INCRUSE ELLIPTA) 62.5 MCG/ACT 1 puff  1 puff Inhalation Daily Karie Soda, MD   1 puff at 02/11/22 0928   hydrALAZINE (APRESOLINE) injection 10 mg  10 mg Intravenous Q2H PRN Karie Soda, MD       hydrocortisone-pramoxine [facility]) 2.5-1 % rectal cream   Rectal Q6H PRN Darnell Level, MD   Given at 02/10/22 1320   HYDROmorphone (DILAUDID) injection 0.5-2 mg  0.5-2 mg Intravenous Q4H PRN Karie Soda, MD   1 mg at 02/11/22 1042   hydrOXYzine (ATARAX) tablet 10 mg  10 mg Oral Q6H PRN Karie Soda, MD   10 mg at 02/10/22 2207   ibuprofen (ADVIL) tablet 400 mg  400 mg Oral Q6H PRN Darnell Level, MD   400 mg at 02/10/22 1956   ipratropium-albuterol (DUONEB) 0.5-2.5 (3) MG/3ML nebulizer solution 3 mL  3 mL Nebulization Q4H PRN Karie Soda, MD  lip balm (CARMEX) ointment   Topical BID Karie Soda, MD   Given at 02/11/22 1043   loratadine (CLARITIN) tablet 10 mg  10 mg Oral Daily Karie Soda, MD   10 mg at 02/11/22 1042   magic mouthwash  15 mL Oral QID PRN Karie Soda, MD       methocarbamol (ROBAXIN) 1,000 mg in dextrose 5 % 100 mL IVPB  1,000 mg Intravenous Q6H PRN Karie Soda, MD       methocarbamol (ROBAXIN) tablet 1,000 mg  1,000 mg Oral Q6H PRN Karie Soda, MD   1,000 mg at 02/11/22 1041   metoprolol tartrate (LOPRESSOR) injection 5 mg  5 mg Intravenous Q6H PRN Karie Soda, MD       montelukast (SINGULAIR) tablet 10 mg  10 mg Oral Laurena Slimmer, MD   10 mg at 02/10/22 2146   multivitamins with iron tablet 1 tablet  1 tablet Oral Daily Karie Soda, MD   1 tablet at 02/11/22 1043   ondansetron (ZOFRAN) tablet 4 mg  4 mg Oral Q6H PRN Karie Soda, MD       Or   ondansetron  [facility]) injection 4 mg  4 mg Intravenous Q6H PRN Karie Soda, MD       Oral care mouth rinse  15 mL Mouth Rinse 4 times per day Karie Soda, MD   15 mL at 02/11/22 1145   Oral care mouth rinse  15 mL Mouth Rinse PRN Karie Soda, MD       oxybutynin (DITROPAN) tablet 5 mg  5 mg Oral Daily Karie Soda, MD   5 mg at 02/11/22 1042   pantoprazole (PROTONIX) EC tablet 40 mg  40 mg Oral Q1200 Karie Soda, MD   40 mg at 02/11/22 1113   polycarbophil (FIBERCON) tablet 625 mg  625 mg Oral BID Karie Soda, MD   625 mg at 02/11/22 1042   polyvinyl alcohol (LIQUIFILM TEARS) 1.4 % ophthalmic solution 2 drop  2 drop Both Eyes TID PRN Karie Soda, MD       prochlorperazine (COMPAZINE) tablet 10 mg  10 mg Oral Q6H PRN Karie Soda, MD       Or   prochlorperazine (COMPAZINE) injection 5-10 mg  5-10 mg Intravenous Q6H PRN Karie Soda, MD       roflumilast Lenice Llamas) tablet 500 mcg  500 mcg Oral Daily Karie Soda, MD   500 mcg at 02/11/22 1043   simethicone (MYLICON) chewable tablet 40 mg  40 mg Oral Q6H PRN Karie Soda, MD   40 mg at 02/08/22 1638   sodium chloride flush (NS) 0.9 % injection 3 mL  3 mL Intravenous Catha Gosselin, MD   3 mL at 02/11/22 1043   sodium chloride flush (NS) 0.9 % injection 3 mL  3 mL Intravenous PRN Karie Soda, MD       traMADol Janean Sark) tablet 50-100 mg  50-100 mg Oral Q6H PRN Karie Soda, MD   100 mg at 02/09/22 1322   traZODone (DESYREL) tablet 50 mg  50 mg Oral QHS PRN Karie Soda, MD   50 mg at 02/09/22 2052   valACYclovir (VALTREX) tablet 500 mg  500 mg Oral Daily Karie Soda, MD   500 mg at 02/11/22 1042     Allergies  Allergen Reactions   Iohexol Anaphylaxis and Other (See Comments)     Desc: IV DYE  ANAPHYLATIC SHOCK X2 ONCE AFTER PREMEDS    Adhesive [Tape] Itching and Other (See Comments)  Break out   [place] [Telithromycin] Other (See Comments)    Loopy, eyes went different directions   Penicillins Other (See Comments)     Unknown Has patient had a PCN reaction causing immediate rash, facial/tongue/throat swelling, SOB or lightheadedness with hypotension: Unknown Has patient had a PCN reaction causing severe rash involving mucus membranes or skin necrosis: Unknown Has patient had a PCN reaction that required hospitalization: Unknown Has patient had a PCN reaction occurring within the last 10 years: No If all of the above answers are "NO", then may proceed with Cephalosporin use.   Chantix [Varenicline] Palpitations    Signed:   Ardeth Sportsman, MD, FACS, MASCRS Esophageal, Gastrointestinal & Colorectal Surgery Robotic and Minimally Invasive Surgery  [facility] [address] [place], [place] [address] [phone] Fax [phone] Main  CONTACT INFORMATION:  Weekday (9AM-5PM): Call [facility] main office at [phone]  Weeknight (5PM-9AM) or Weekend/Holiday: Check [email or web address] (password " [email or web address]") for General Surgery [facility] coverage  (Please, do not use SecureChat as it is not reliable communication to reach operating surgeons for immediate patient care given surgeries/outpatient duties/clinic/cross-coverage/off post-call which would lead to a delay in care.  Epic staff messaging available for outptient concerns, but may not be answered for 48 hours or more).     02/11/2022, 1:40 PM

## 2022-02-11 NOTE — Care Management Important Message (Signed)
Important Message  Patient Details IM Letter given Name: [name] MRN: [id number] Date of Birth: [birth year]   Medicare Important Message Given:  Yes     Kerin Salen 02/11/2022, 2:20 PM

## 2022-02-13 ENCOUNTER — Telehealth: Payer: Self-pay | Admitting: Cardiology

## 2022-02-13 MED ORDER — DILTIAZEM HCL 30 MG PO TABS: 30.0000 mg | ORAL_TABLET | Freq: Three times a day (TID) | ORAL | 3 refills | Status: DC

## 2022-02-13 NOTE — Telephone Encounter (Signed)
Patient notified and verbalized understanding. Patient medication list updated to reflect medication changes.

## 2022-02-13 NOTE — Telephone Encounter (Signed)
Pt c/o medication issue:  1. Name of Medication:  diltiazem (CARDIZEM CD) 240 MG 24 hr capsule  2. How are you currently taking this medication (dosage and times per day)? 80 mg 3 x's daily   3. Are you having a reaction (difficulty breathing--STAT)?   4. What is your medication issue? Pt states that she had surgery recently and while in hospital they changed her medication up a bit and this med was one of them. She is requesting call back to discuss and to make sure change is okay.

## 2022-02-13 NOTE — Telephone Encounter (Signed)
Spoke to pt who stated she had surgery @ [facility] on 10/5 where her Cardizem was changed. Pt stated that She was told not to take Cardizem 240 mg capsules at all and take Cardizem 30 mg capsules 3x QD. Pt stated that hr/bp have been running normal, but would like to know if provider agrees with medication changes.   Please advise.

## 2022-03-18 NOTE — Progress Notes (Addendum)
GI Office Note    Referring Provider: Asencion Noble, MD Primary Care Physician:  Asencion Noble, MD  Primary Gastroenterologist: Formerly Dr. Laural Golden  Chief Complaint   Chief Complaint  Patient presents with   Hospitalization Follow-up    Having multiple issues with blood in stool, rectal pain, and abdominal pain.     History of Present Illness   [name] is a 66 y.o. female presenting today for hospital follow up.   She had low anterior rectosigmoid resection in 02/2022 for sigmoid colon stricture. States she did well for one week after surgery but then started having issues. She complains of abdominal cramps/spasms before BMs, several times daily and at night. Can be severe. Notes the pain before and during BM. Just recently started having some solid stool. She has constant urge to have a BM. Complains of rectal soreness. Complains of passing bloody mucous. Was having some of this before the surgery. Sometimes notes blood in the stool. Blood is never heavy like menstrual flow. She has felt a "knot" in the ruq since surgery. Complains of fatigue. She has some mild heartburn.    EGD 09/2021: Normal hypopharynx. - Normal esophagus. - Z-line irregular, 38 cm from the incisors. - 2 cm hiatal hernia. - Nodular mucosa in the prepyloric region of the stomach. Biopsied. - Normal duodenal bulb and second portion of the duodenum. - Dilation of esophagus attempted but not completed -antral bx with reactive gastropathy  Colonoscopy 09/2021: -Preparation of the colon was fair. -Perianal skin tags found on perianal exam. -Three small polyps in the transverse colon, at the hepatic flexure and in the ascending colon, removed with a cold snare. Complete resection. Partial retrieval. -Sigmoid diverticulosis and sigmoid diverticulitis. -Stricture in the distal sigmoid colon. Stricture appears to be benign secondary to diverticulitis. -External hemorrhoids. -tubular adenomas -colonoscopy in  5 years.     Medications   Current Outpatient Medications  Medication Sig Dispense Refill   albuterol (VENTOLIN HFA) 108 (90 Base) MCG/ACT inhaler Inhale 1-2 puffs into the lungs every 6 (six) hours as needed for wheezing or shortness of breath.     ALPRAZolam (XANAX) 0.25 MG tablet Take 0.25 mg by mouth 3 (three) times daily as needed for anxiety.      aspirin 81 MG tablet Take 1 tablet (81 mg total) by mouth daily. 100 tablet 3   atorvastatin (LIPITOR) 40 MG tablet TAKE 1 TABLET DAILY (Patient taking differently: Take 40 mg by mouth at bedtime.) 90 tablet 3   azelastine (ASTELIN) 0.1 % nasal spray Place 1 spray into both nostrils daily as needed for rhinitis or allergies. Use in each nostril as directed     bisacodyl (DULCOLAX) 5 MG EC tablet Take by mouth.     carboxymethylcellulose 1 % ophthalmic solution Place 2 drops into both eyes 3 (three) times daily as needed (for dry eyes).     cetirizine (ZYRTEC) 10 MG tablet Take 10 mg by mouth daily.     clonazePAM (KLONOPIN) 0.5 MG tablet Take 1 tablet (0.5 mg total) by mouth at bedtime. (Patient taking differently: Take 0.5 mg by mouth at bedtime as needed for anxiety.) 30 tablet 5   diclofenac Sodium (VOLTAREN) 1 % GEL Apply 2 g topically 2 (two) times daily as needed (pain).     dicyclomine (BENTYL) 10 MG capsule Take 1 capsule (10 mg total) by mouth 3 (three) times daily as needed for spasms. 90 capsule 2   diltiazem (CARDIZEM) 30 MG tablet  Take 1 tablet (30 mg total) by mouth 3 (three) times daily. Take 1-2 tablets every 6 hours as needed for persistent tachycardai 90 tablet 3   diltiazem (DILACOR XR) 240 MG 24 hr capsule Take 240 mg by mouth daily.     escitalopram (LEXAPRO) 20 MG tablet Take 20 mg by mouth daily.     fluticasone (FLONASE) 50 MCG/ACT nasal spray Place 1 spray into both nostrils daily as needed for allergies.     Fluticasone-Umeclidin-Vilant (TRELEGY ELLIPTA) 100-62.5-25 MCG/ACT AEPB Take 1 puff by mouth daily. 180 each 3    hydrOXYzine (ATARAX) 25 MG tablet Take 12.5 mg by mouth every 6 (six) hours as needed for itching.     ibuprofen (ADVIL) 200 MG tablet Take 400 mg by mouth every 6 (six) hours as needed for moderate pain.     methocarbamol (ROBAXIN) 500 MG tablet Take 1,000 mg by mouth every 6 (six) hours as needed.     montelukast (SINGULAIR) 10 MG tablet Take 1 tablet (10 mg total) by mouth at bedtime. 30 tablet 1   NON FORMULARY Pt uses a bipap with oxygen when laying down     oxybutynin (DITROPAN) 5 MG tablet Take 5 mg by mouth daily.      OXYGEN Inhale 4 L into the lungs continuous.     promethazine (PHENERGAN) 12.5 MG tablet Take by mouth.     roflumilast (DALIRESP) 500 MCG TABS tablet Take 500 mcg by mouth daily.     traMADol (ULTRAM) 50 MG tablet Take 1 tablet (50 mg total) by mouth every 6 (six) hours as needed for moderate pain. 30 tablet 0   traZODone (DESYREL) 50 MG tablet Take 50 mg by mouth at bedtime as needed for sleep.     valACYclovir (VALTREX) 1000 MG tablet Take 1,000 mg by mouth daily as needed (outbreaks).     No current facility-administered medications for this visit.    Allergies   Allergies as of 03/19/2022 - Review Complete 03/19/2022  Allergen Reaction Noted   Iohexol Anaphylaxis and Other (See Comments) 06/12/2006   Adhesive [tape] Itching and Other (See Comments) 05/05/2017   Ketek [telithromycin] Other (See Comments) 08/13/2013   Penicillins Other (See Comments) 01/29/2011   Chantix [varenicline] Palpitations 08/19/2012        Review of Systems   General: Negative for anorexia, weight loss, fever, chills, fatigue, weakness. ENT: Negative for hoarseness, difficulty swallowing , nasal congestion. CV: Negative for chest pain, angina, palpitations, dyspnea on exertion, peripheral edema.  Respiratory: Negative for dyspnea at rest, dyspnea on exertion, cough, sputum, wheezing.  GI: See history of present illness. GU:  Negative for dysuria, hematuria, urinary  incontinence, urinary frequency, nocturnal urination.  Endo: Negative for unusual weight change.     Physical Exam   BP (!) 146/76 (BP Location: Right Arm, Patient Position: Sitting, Cuff Size: Normal)   Pulse 97   Temp 98.2 F (36.8 C) (Oral)   Ht _0  (1.6 m)   Wt 124 lb 12.8 oz (56.6 kg)   SpO2 98%   BMI 22.11 kg/m    General: Well-nourished, well-developed in no acute distress. Oxygen via [place] Eyes: No icterus. Mouth: Oropharyngeal mucosa moist and pink , no lesions erythema or exudate. Lungs: Clear to auscultation bilaterally.  Heart: Regular rate and rhythm, no murmurs rubs or gallops.  Abdomen: Bowel sounds are normal, nontender, nondistended, no hepatosplenomegaly or masses,  no abdominal bruits or hernia , no rebound or guarding.  Rectal: not performed Extremities: No  lower extremity edema. No clubbing or deformities. Neuro: Alert and oriented x 4   Skin: Warm and dry, no jaundice.   Psych: Alert and cooperative, normal mood and affect.  Labs   Lab Results  Component Value Date   CREATININE 0.64 02/09/2022   BUN 5 (L) 02/08/2022   NA 140 02/08/2022   K 3.3 (L) 02/09/2022   CL 104 02/08/2022   CO2 30 02/08/2022   Lab Results  Component Value Date   WBC 12.4 (H) 02/08/2022   HGB 8.2 (L) 02/11/2022   HCT 26.5 (L) 02/08/2022   MCV 97.1 02/08/2022   PLT 278 02/08/2022     Imaging Studies   No results found.  Assessment   LLQ pain/Rectal pain/bloody mucous/stools: continues to have problems with LLQ pain. Recent surgery for sigmoid stricture as outlined. Passing bloody mucous and complains of rectal pain, urgency, abdominal cramping before/during BMs. Some of her symptoms may be contributed to her recent surgery/still healing. Has had some increased stools frequency/loose stools. In setting of recent abx, we need to rule out C.diff as well as other infectious etiologies. Will also update labs, follow up on anemia.    Chronically elevated alk phos: patient  requesting further work up.    PLAN   Collect stool specimen and labs. Further recommendations to follow.    Laureen Ochs. Bobby Rumpf, [place], PA-C [facility]  I have reviewed the note and agree with the APP's assessment as described in this progress note  Maylon Peppers, MD Gastroenterology and Hepatology [facility]

## 2022-03-19 ENCOUNTER — Ambulatory Visit (INDEPENDENT_AMBULATORY_CARE_PROVIDER_SITE_OTHER): Payer: Medicare Other | Admitting: Gastroenterology

## 2022-03-19 ENCOUNTER — Telehealth: Payer: Self-pay

## 2022-03-19 ENCOUNTER — Encounter: Payer: Self-pay | Admitting: Gastroenterology

## 2022-03-19 VITALS — BP 124/74 | HR 97 | Temp 98.2°F | Ht 63.0 in | Wt 124.8 lb

## 2022-03-19 DIAGNOSIS — K625 Hemorrhage of anus and rectum: Secondary | ICD-10-CM

## 2022-03-19 DIAGNOSIS — I2510 Atherosclerotic heart disease of native coronary artery without angina pectoris: Secondary | ICD-10-CM | POA: Diagnosis not present

## 2022-03-19 DIAGNOSIS — A09 Infectious gastroenteritis and colitis, unspecified: Secondary | ICD-10-CM | POA: Diagnosis not present

## 2022-03-19 DIAGNOSIS — R748 Abnormal levels of other serum enzymes: Secondary | ICD-10-CM

## 2022-03-19 DIAGNOSIS — R1032 Left lower quadrant pain: Secondary | ICD-10-CM

## 2022-03-19 DIAGNOSIS — R1030 Lower abdominal pain, unspecified: Secondary | ICD-10-CM

## 2022-03-19 NOTE — Telephone Encounter (Signed)
Erica from [facility] called stating that the pt is requesting that a stool test for cdiff to be done as well. Danae Chen gave her the kit in case we did want to put the order in for c diff as well.

## 2022-03-19 NOTE — Telephone Encounter (Signed)
Gi profile has Cdiff as part of the test.

## 2022-03-19 NOTE — Patient Instructions (Signed)
Please complete stool and labs as soon as you can.  Further recommendations to follow once results reviewed.

## 2022-03-20 LAB — CBC WITH DIFFERENTIAL/PLATELET
Basophils Absolute: 0 10*3/uL (ref 0.0–0.2)
Basos: 0 %
EOS (ABSOLUTE): 0.2 10*3/uL (ref 0.0–0.4)
Eos: 2 %
Hematocrit: 35.1 % (ref 34.0–46.6)
Hemoglobin: 11.1 g/dL (ref 11.1–15.9)
Immature Grans (Abs): 0 10*3/uL (ref 0.0–0.1)
Immature Granulocytes: 0 %
Lymphocytes Absolute: 2.7 10*3/uL (ref 0.7–3.1)
Lymphs: 25 %
MCH: 29.9 pg (ref 26.6–33.0)
MCHC: 31.6 g/dL (ref 31.5–35.7)
MCV: 95 fL (ref 79–97)
Monocytes Absolute: 0.8 10*3/uL (ref 0.1–0.9)
Monocytes: 7 %
Neutrophils Absolute: 7.2 10*3/uL — ABNORMAL HIGH (ref 1.4–7.0)
Neutrophils: 66 %
Platelets: 448 10*3/uL (ref 150–450)
RBC: 3.71 x10E6/uL — ABNORMAL LOW (ref 3.77–5.28)
RDW: 12.6 % (ref 11.7–15.4)
WBC: 10.9 10*3/uL — ABNORMAL HIGH (ref 3.4–10.8)

## 2022-03-20 LAB — COMPREHENSIVE METABOLIC PANEL
ALT: 14 IU/L (ref 0–32)
AST: 13 IU/L (ref 0–40)
Albumin/Globulin Ratio: 2 (ref 1.2–2.2)
Albumin: 4.4 g/dL (ref 3.9–4.9)
Alkaline Phosphatase: 120 IU/L (ref 44–121)
BUN/Creatinine Ratio: 13 (ref 12–28)
BUN: 8 mg/dL (ref 8–27)
Bilirubin Total: 0.3 mg/dL (ref 0.0–1.2)
CO2: 26 mmol/L (ref 20–29)
Calcium: 9.2 mg/dL (ref 8.7–10.3)
Chloride: 99 mmol/L (ref 96–106)
Creatinine, Ser: 0.62 mg/dL (ref 0.57–1.00)
Globulin, Total: 2.2 g/dL (ref 1.5–4.5)
Glucose: 109 mg/dL — ABNORMAL HIGH (ref 70–99)
Potassium: 4.2 mmol/L (ref 3.5–5.2)
Sodium: 140 mmol/L (ref 134–144)
Total Protein: 6.6 g/dL (ref 6.0–8.5)
eGFR: 99 mL/min/{1.73_m2} (ref >59)

## 2022-03-20 LAB — IGA: IgA/Immunoglobulin A, Serum: 162 mg/dL (ref 87–352)

## 2022-03-20 LAB — TSH+FREE T4
Free T4: 1.04 ng/dL (ref 0.82–1.77)
TSH: 1.74 u[IU]/mL (ref 0.450–4.500)

## 2022-03-20 LAB — MITOCHONDRIAL ANTIBODIES: Mitochondrial Ab: <20 Units (ref 0.0–20.0)

## 2022-03-20 LAB — TISSUE TRANSGLUTAMINASE, IGA: Transglutaminase IgA: <2 U/mL (ref 0–3)

## 2022-03-20 LAB — IRON,TIBC AND FERRITIN PANEL
Ferritin: 32 ng/mL (ref 15–150)
Iron Saturation: 18 % (ref 15–55)
Iron: 65 ug/dL (ref 27–139)
Total Iron Binding Capacity: 369 ug/dL (ref 250–450)
UIBC: 304 ug/dL (ref 118–369)

## 2022-03-20 LAB — LIPASE: Lipase: 40 U/L (ref 14–72)

## 2022-03-20 LAB — GAMMA GT: GGT: 17 IU/L (ref 0–60)

## 2022-03-20 NOTE — Telephone Encounter (Signed)
Pt was made aware.  

## 2022-03-21 ENCOUNTER — Ambulatory Visit (INDEPENDENT_AMBULATORY_CARE_PROVIDER_SITE_OTHER): Payer: Medicare Other | Admitting: Pulmonary Disease

## 2022-03-21 ENCOUNTER — Encounter: Payer: Self-pay | Admitting: Pulmonary Disease

## 2022-03-21 VITALS — BP 124/76 | HR 110 | Temp 97.7°F | Ht 62.0 in | Wt 125.2 lb

## 2022-03-21 DIAGNOSIS — G4733 Obstructive sleep apnea (adult) (pediatric): Secondary | ICD-10-CM | POA: Diagnosis not present

## 2022-03-21 DIAGNOSIS — A09 Infectious gastroenteritis and colitis, unspecified: Secondary | ICD-10-CM | POA: Diagnosis not present

## 2022-03-21 DIAGNOSIS — I2510 Atherosclerotic heart disease of native coronary artery without angina pectoris: Secondary | ICD-10-CM

## 2022-03-21 DIAGNOSIS — J9611 Chronic respiratory failure with hypoxia: Secondary | ICD-10-CM | POA: Diagnosis not present

## 2022-03-21 DIAGNOSIS — J9612 Chronic respiratory failure with hypercapnia: Secondary | ICD-10-CM | POA: Diagnosis not present

## 2022-03-21 DIAGNOSIS — Z23 Encounter for immunization: Secondary | ICD-10-CM | POA: Diagnosis not present

## 2022-03-21 DIAGNOSIS — J432 Centrilobular emphysema: Secondary | ICD-10-CM

## 2022-03-21 NOTE — Patient Instructions (Signed)
Flu shot today Follow up in 6 months 

## 2022-03-21 NOTE — Progress Notes (Signed)
[facility]  Chief Complaint  Patient presents with   Follow-up    Bipap working well no new concerns  Flu shot today     Constitutional:  BP 124/76   Pulse (!) 110   Temp 97.7 F (36.5 C)   Ht '5\' 2"'$  (1.575 m)   Wt 125 lb 3.2 oz (56.8 kg)   SpO2 97% Comment: 4LO2 cont  BMI 22.90 kg/m   Past Medical History:  Anxiety, OA, Depression, Diverticulosis, HLD, IBS, Osteoporosis, PNA, Shingles, SVT  Past Surgical History:  Her  has a past surgical history that includes Spinal fusion; Back surgery; Colonoscopy (03/22/2011); Colonoscopy with propofol (N/A, 05/16/2017); polypectomy (05/16/2017); Cardiac catheterization (2004); Flexible sigmoidoscopy (N/A, 09/26/2017); Breast surgery; Esophagogastroduodenoscopy (egd) with propofol (N/A, 04/19/2020); Esophageal dilation (N/A, 04/19/2020); Colonoscopy with propofol (N/A, 09/12/2021); Esophagogastroduodenoscopy (egd) with propofol (N/A, 09/12/2021); biopsy (09/12/2021); Polypectomy (09/12/2021); Vulvectomy; Ostomy (N/A, 02/07/2022); and Proctoscopy (N/A, 02/07/2022).  Brief Summary:  [name] is a 66 y.o. female former smoker with COPD from emphysema and chronic respiratory failure with hypoxia.      Subjective:   She is here with her caregiver.  She had colon surgery.  No respiratory complications.  Breathing good.  Uses 3 to 7 liters oxygen depending on her activity level.  Not having cough, wheeze, or sputum.  Uses Bipap nightly.  No issues with pressure setting or mask fit.  She might need left hip surgery.  Physical Exam:   Appearance - well kempt, wearing oxygen  ENMT - no sinus tenderness, no oral exudate, no LAN, Mallampati 2 airway, no stridor  Respiratory - equal breath sounds bilaterally, no wheezing or rales  CV - s1s2 regular rate and rhythm, no murmurs  Ext - no clubbing, no edema  Skin - no rashes  Psych - normal mood and affect     Pulmonary testing:   Spirometry 12/31/11 >> FEV1 0.4 (18%), FEV1% 42  Chest Imaging:  CT chest 10/16/19 >> mod/severe centrilobular emphysema, granuloma RUL  Sleep Tests:  Bipap titration study 05/16/14 >> Bipap 15/10 cm H2O with 3 liters O2 Bipap 02/18/22 to 03/19/22 >> used on 30 of 30 nights with average 14 hrs.  Average AHI 0.7 with Bipap 14/6 cm H2O.  Cardiac Tests:  Echo 10/31/20 >> EF 55 to 60%, grade 1 DD  Social History:  She  reports that she quit smoking about 6 years ago. Her smoking use included cigarettes. She has a 40.00 pack-year smoking history. She has been exposed to tobacco smoke. She has never used smokeless tobacco. She reports current alcohol use. She reports that she does not use drugs.  Family History:  Her family history includes Asthma in her mother; Diabetes in her mother; Heart attack in her mother; Heart failure in her mother.     Assessment/Plan:   Severe COPD with emphysema. - continue trelegy 100 one puff daily, daliresp 500 mcg daily, singulair 10 mg qhs - prn albuterol, mucinex - flu shot today - discussed RSV and COVID vaccines  Obstructive sleep apnea. - she reports compliance with Bipap and benefit from therapy - uses [place] as DME for Bipap - continue Bipap 14/6 cm H2O  REM Sleep Behavior disorder. - clonazepam 0.5 mg qhs prn  Chronic respiratory failure with hypoxia. - uses 3 to 7 liters oxygen depending on activity level - she feels symptomatic if her SpO2 goes below 95% - uses [facility] as DME for oxygen set up  Allergic rhinitis. -  continue singulair - prn azelastine, nasal irrigation, zyrtec, flonase  Hip pain. - explained her pulmonary risk for hip surgery if needed would be no different than for colon surgery, and she did okay with this - if she needed hip surgery there isn't a pulmonary contra-indication for this  Time Spent Involved in Patient Care on Day of Examination:  36 minutes  Follow up:   Patient Instructions  Flu shot  today  Follow up in 6 months  Medication List:   Allergies as of 03/21/2022       Reactions   Iohexol Anaphylaxis, Other (See Comments)    Desc: IV DYE  ANAPHYLATIC SHOCK X2 ONCE AFTER PREMEDS   Adhesive [tape] Itching, Other (See Comments)   Break out   Ketek [telithromycin] Other (See Comments)   Loopy, eyes went different directions   Penicillins Other (See Comments)   Unknown Has patient had a PCN reaction causing immediate rash, facial/tongue/throat swelling, SOB or lightheadedness with hypotension: Unknown Has patient had a PCN reaction causing severe rash involving mucus membranes or skin necrosis: Unknown Has patient had a PCN reaction that required hospitalization: Unknown Has patient had a PCN reaction occurring within the last 10 years: No If all of the above answers are "NO", then may proceed with Cephalosporin use.   Chantix [varenicline] Palpitations        Medication List        Accurate as of March 21, 2022  2:09 PM. If you have any questions, ask your nurse or doctor.          albuterol 108 (90 Base) MCG/ACT inhaler Commonly known as: VENTOLIN HFA Inhale 1-2 puffs into the lungs every 6 (six) hours as needed for wheezing or shortness of breath.   ALPRAZolam 0.25 MG tablet Commonly known as: XANAX Take 0.25 mg by mouth 3 (three) times daily as needed for anxiety.   aspirin 81 MG tablet Take 1 tablet (81 mg total) by mouth daily.   atorvastatin 40 MG tablet Commonly known as: LIPITOR TAKE 1 TABLET DAILY What changed: when to take this   azelastine 0.1 % nasal spray Commonly known as: ASTELIN Place 1 spray into both nostrils daily as needed for rhinitis or allergies. Use in each nostril as directed   bisacodyl 5 MG EC tablet Commonly known as: DULCOLAX Take by mouth.   carboxymethylcellulose 1 % ophthalmic solution Place 2 drops into both eyes 3 (three) times daily as needed (for dry eyes).   cetirizine 10 MG tablet Commonly known as:  ZYRTEC Take 10 mg by mouth daily.   clonazePAM 0.5 MG tablet Commonly known as: KLONOPIN Take 1 tablet (0.5 mg total) by mouth at bedtime. What changed:  when to take this reasons to take this   diclofenac Sodium 1 % Gel Commonly known as: VOLTAREN Apply 2 g topically 2 (two) times daily as needed (pain).   dicyclomine 10 MG capsule Commonly known as: BENTYL Take 1 capsule (10 mg total) by mouth 3 (three) times daily as needed for spasms.   diltiazem 240 MG 24 hr capsule Commonly known as: DILACOR XR Take 240 mg by mouth daily.   diltiazem 30 MG tablet Commonly known as: Cardizem Take 1 tablet (30 mg total) by mouth 3 (three) times daily. Take 1-2 tablets every 6 hours as needed for persistent tachycardai   escitalopram 20 MG tablet Commonly known as: LEXAPRO Take 20 mg by mouth daily.   fluticasone 50 MCG/ACT nasal spray Commonly known as: [place]  1 spray into both nostrils daily as needed for allergies.   hydrOXYzine 25 MG tablet Commonly known as: ATARAX Take 12.5 mg by mouth every 6 (six) hours as needed for itching.   ibuprofen 200 MG tablet Commonly known as: ADVIL Take 400 mg by mouth every 6 (six) hours as needed for moderate pain.   methocarbamol 500 MG tablet Commonly known as: ROBAXIN Take 1,000 mg by mouth every 6 (six) hours as needed.   montelukast 10 MG tablet Commonly known as: SINGULAIR Take 1 tablet (10 mg total) by mouth at bedtime.   NON FORMULARY Pt uses a bipap with oxygen when laying down   oxybutynin 5 MG tablet Commonly known as: DITROPAN Take 5 mg by mouth daily.   OXYGEN Inhale 4 L into the lungs continuous.   promethazine 12.5 MG tablet Commonly known as: PHENERGAN Take by mouth.   roflumilast 500 MCG Tabs tablet Commonly known as: DALIRESP Take 500 mcg by mouth daily.   traMADol 50 MG tablet Commonly known as: ULTRAM Take 1 tablet (50 mg total) by mouth every 6 (six) hours as needed for moderate pain.    traZODone 50 MG tablet Commonly known as: DESYREL Take 50 mg by mouth at bedtime as needed for sleep.   Trelegy Ellipta 100-62.5-25 MCG/ACT Aepb Generic drug: Fluticasone-Umeclidin-Vilant Take 1 puff by mouth daily.   valACYclovir 1000 MG tablet Commonly known as: VALTREX Take 1,000 mg by mouth daily as needed (outbreaks).        Signature:  Chesley Mires, MD [place] Pager - [phone] 03/21/2022, 2:09 PM

## 2022-03-24 DIAGNOSIS — R109 Unspecified abdominal pain: Secondary | ICD-10-CM | POA: Insufficient documentation

## 2022-03-24 DIAGNOSIS — K625 Hemorrhage of anus and rectum: Secondary | ICD-10-CM | POA: Insufficient documentation

## 2022-03-25 ENCOUNTER — Other Ambulatory Visit: Payer: Self-pay | Admitting: Gastroenterology

## 2022-03-25 LAB — GI PROFILE, STOOL, PCR

## 2022-03-25 MED ORDER — VANCOMYCIN HCL 125 MG PO CAPS: 125.0000 mg | ORAL_CAPSULE | Freq: Four times a day (QID) | ORAL | 0 refills | Status: AC

## 2022-04-04 DIAGNOSIS — I2510 Atherosclerotic heart disease of native coronary artery without angina pectoris: Secondary | ICD-10-CM | POA: Diagnosis not present

## 2022-04-04 DIAGNOSIS — J449 Chronic obstructive pulmonary disease, unspecified: Secondary | ICD-10-CM | POA: Diagnosis not present

## 2022-04-04 DIAGNOSIS — J9611 Chronic respiratory failure with hypoxia: Secondary | ICD-10-CM | POA: Diagnosis not present

## 2022-04-08 ENCOUNTER — Ambulatory Visit (INDEPENDENT_AMBULATORY_CARE_PROVIDER_SITE_OTHER): Payer: Medicare Other | Admitting: Gastroenterology

## 2022-04-08 ENCOUNTER — Encounter: Payer: Self-pay | Admitting: Gastroenterology

## 2022-04-08 VITALS — BP 134/79 | HR 102 | Temp 98.1°F | Ht 62.0 in | Wt 125.6 lb

## 2022-04-08 DIAGNOSIS — K5732 Diverticulitis of large intestine without perforation or abscess without bleeding: Secondary | ICD-10-CM | POA: Diagnosis not present

## 2022-04-08 DIAGNOSIS — A0472 Enterocolitis due to Clostridium difficile, not specified as recurrent: Secondary | ICD-10-CM | POA: Diagnosis not present

## 2022-04-08 DIAGNOSIS — J9611 Chronic respiratory failure with hypoxia: Secondary | ICD-10-CM | POA: Diagnosis not present

## 2022-04-08 DIAGNOSIS — K625 Hemorrhage of anus and rectum: Secondary | ICD-10-CM

## 2022-04-08 DIAGNOSIS — I2510 Atherosclerotic heart disease of native coronary artery without angina pectoris: Secondary | ICD-10-CM

## 2022-04-08 MED ORDER — HYDROXYZINE HCL 25 MG PO TABS: 12.5000 mg | ORAL_TABLET | Freq: Four times a day (QID) | ORAL | 0 refills | Status: DC | PRN

## 2022-04-08 NOTE — Progress Notes (Signed)
GI Office Note    Referring Provider: Asencion Noble, MD Primary Care Physician:  Asencion Noble, MD  Primary Gastroenterologist: formerly Dr. Laural Golden  Chief Complaint   Chief Complaint  Patient presents with   Follow-up    Follow up C-Diff and surgery    History of Present Illness   [name] is a 66 y.o. female presenting today for follow-up.  Last seen in the office in November.  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  She states she did well for 1 week postop but then started having issues again. Complaining of LLQ pain/rectal pain with bloody mucous in the stools, increased stool frequency. She also has chronically elevated alk phos, noted in [place] system, AP in the 170-200 range. AMA negative. GGT normal. LFTs entirely normal when rechecked in 03/2022.   Patient states she is doing better. She kept track of her stools. She went from having 16 stools during the day and 7 at night to 6 during the day and 3 at night. Small volume stools. Mucous is less. She notes she continues to pass marble sized blood clots which started since her surgery. She had been dark mucous blood mixed in stool or covering stool before the surgery. Her rectal pain and abdominal pain are much improved. She is still having some abdominal cramping that is sharp lasting for 30 seconds or less, about 2-3 times per day but she states this is better as well. She just completed vancomycin on Saturday. She has had some formed bowel movements in the past 4 days. She denies any fever.       EGD 09/2021: Normal hypopharynx. - Normal esophagus. - Z-line irregular, 38 cm from the incisors. - 2 cm hiatal hernia. - Nodular mucosa in the prepyloric region of the stomach. Biopsied. - Normal duodenal bulb and second portion of the duodenum. - Dilation of esophagus attempted but not completed -antral bx with reactive gastropathy   Colonoscopy 09/2021: -Preparation of the  colon was fair. -Perianal skin tags found on perianal exam. -Three small polyps in the transverse colon, at the hepatic flexure and in the ascending colon, removed with a cold snare. Complete resection. Partial retrieval. -Sigmoid diverticulosis and sigmoid diverticulitis. -Stricture in the distal sigmoid colon. Stricture appears to be benign secondary to diverticulitis. -small External hemorrhoids. -tubular adenomas -colonoscopy in 5 years.     Medications   Current Outpatient Medications  Medication Sig Dispense Refill   albuterol (VENTOLIN HFA) 108 (90 Base) MCG/ACT inhaler Inhale 1-2 puffs into the lungs every 6 (six) hours as needed for wheezing or shortness of breath.     ALPRAZolam (XANAX) 0.25 MG tablet Take 0.25 mg by mouth 3 (three) times daily as needed for anxiety.      aspirin 81 MG tablet Take 1 tablet (81 mg total) by mouth daily. 100 tablet 3   atorvastatin (LIPITOR) 40 MG tablet TAKE 1 TABLET DAILY (Patient taking differently: Take 40 mg by mouth at bedtime.) 90 tablet 3   azelastine (ASTELIN) 0.1 % nasal spray Place 1 spray into both nostrils daily as needed for rhinitis or allergies. Use in each nostril as directed     carboxymethylcellulose 1 % ophthalmic solution Place 2 drops into both eyes 3 (three) times daily as needed (for dry eyes).     cetirizine (ZYRTEC) 10 MG tablet Take 10 mg by mouth daily.     clonazePAM (KLONOPIN) 0.5 MG tablet Take 1 tablet (  0.5 mg total) by mouth at bedtime. (Patient taking differently: Take 0.5 mg by mouth at bedtime as needed for anxiety.) 30 tablet 5   diclofenac Sodium (VOLTAREN) 1 % GEL Apply 2 g topically 2 (two) times daily as needed (pain).     dicyclomine (BENTYL) 10 MG capsule Take 1 capsule (10 mg total) by mouth 3 (three) times daily as needed for spasms. 90 capsule 2   diltiazem (CARDIZEM) 30 MG tablet Take 1 tablet (30 mg total) by mouth 3 (three) times daily. Take 1-2 tablets every 6 hours as needed for persistent  tachycardai 90 tablet 3   diltiazem (DILACOR XR) 240 MG 24 hr capsule Take 240 mg by mouth daily.     escitalopram (LEXAPRO) 20 MG tablet Take 20 mg by mouth daily.     fluticasone (FLONASE) 50 MCG/ACT nasal spray Place 1 spray into both nostrils daily as needed for allergies.     Fluticasone-Umeclidin-Vilant (TRELEGY ELLIPTA) 100-62.5-25 MCG/ACT AEPB Take 1 puff by mouth daily. 180 each 3   hydrOXYzine (ATARAX) 25 MG tablet Take 12.5 mg by mouth every 6 (six) hours as needed for itching.     ibuprofen (ADVIL) 200 MG tablet Take 400 mg by mouth every 6 (six) hours as needed for moderate pain.     methocarbamol (ROBAXIN) 500 MG tablet Take 1,000 mg by mouth every 6 (six) hours as needed.     montelukast (SINGULAIR) 10 MG tablet Take 1 tablet (10 mg total) by mouth at bedtime. 30 tablet 1   NON FORMULARY Pt uses a bipap with oxygen when laying down     oxybutynin (DITROPAN) 5 MG tablet Take 5 mg by mouth daily.      OXYGEN Inhale 4 L into the lungs continuous.     promethazine (PHENERGAN) 12.5 MG tablet Take by mouth.     roflumilast (DALIRESP) 500 MCG TABS tablet Take 500 mcg by mouth daily.     traMADol (ULTRAM) 50 MG tablet Take 1 tablet (50 mg total) by mouth every 6 (six) hours as needed for moderate pain. 30 tablet 0   traZODone (DESYREL) 50 MG tablet Take 50 mg by mouth at bedtime as needed for sleep.     valACYclovir (VALTREX) 1000 MG tablet Take 1,000 mg by mouth daily as needed (outbreaks).     No current facility-administered medications for this visit.    Allergies   Allergies as of 04/08/2022 - Review Complete 04/08/2022  Allergen Reaction Noted   Iohexol Anaphylaxis and Other (See Comments) 06/12/2006   Adhesive [tape] Itching and Other (See Comments) 05/05/2017   Ketek [telithromycin] Other (See Comments) 08/13/2013   Penicillins Other (See Comments) 01/29/2011   Chantix [varenicline] Palpitations 08/19/2012       Review of Systems   General: Negative for anorexia,  weight loss, fever, chills, fatigue, weakness. ENT: Negative for hoarseness, difficulty swallowing , nasal congestion. CV: Negative for chest pain, angina, palpitations, dyspnea on exertion, peripheral edema.  Respiratory: Negative for dyspnea at rest, dyspnea on exertion, cough, sputum, wheezing.  GI: See history of present illness. GU:  Negative for dysuria, hematuria, urinary incontinence, urinary frequency, nocturnal urination.  Endo: Negative for unusual weight change.     Physical Exam   BP 134/79   Pulse (!) 102   Temp 98.1 F (36.7 C)   Ht _0  (1.575 m)   Wt 125 lb 9.6 oz (57 kg)   BMI 22.97 kg/m    General: Well-nourished, well-developed in no acute distress.  Eyes: No icterus. Mouth: Oropharyngeal mucosa moist and pink , no lesions erythema or exudate. Lungs: Clear to auscultation bilaterally.  Heart: Regular rate and rhythm, no murmurs rubs or gallops.  Abdomen: Bowel sounds are normal, nontender, nondistended, no hepatosplenomegaly or masses,  no abdominal bruits or hernia , no rebound or guarding.  Rectal: large external skin tag. Nontender rectal exam. Suspected palpable hemorrhoid just inside anal canal. No blood or stool on gloved finger.  Extremities: No lower extremity edema. No clubbing or deformities. Neuro: Alert and oriented x 4   Skin: Warm and dry, no jaundice.   Psych: Alert and cooperative, normal mood and affect.  Labs   Lab Results  Component Value Date   CREATININE 0.62 03/19/2022   BUN 8 03/19/2022   NA 140 03/19/2022   K 4.2 03/19/2022   CL 99 03/19/2022   CO2 26 03/19/2022   Lab Results  Component Value Date   ALT 14 03/19/2022   AST 13 03/19/2022   GGT 17 03/19/2022   ALKPHOS 120 03/19/2022   BILITOT 0.3 03/19/2022   Lab Results  Component Value Date   WBC 10.9 (H) 03/19/2022   HGB 11.1 03/19/2022   HCT 35.1 03/19/2022   MCV 95 03/19/2022   PLT 448 03/19/2022    Imaging Studies   No results found.  Assessment   Left  lower quadrant pain/rectal bleeding/frequent stools: Surgery for sigmoid stricture as outlined.  Tested positive for C. difficile after last visit.  Completed 10-day course of vancomycin over the weekend.  Overall her stool frequency has improved but she continues to have frequent small stools.  She has had some solid stools over the past few days.  Continues to pass small blood clots per rectum.  Difficult situation with recent surgery for sigmoid colon stricture.  It is not clear how much of her symptoms are related to C. difficile, recovery colon surgery etc.  Bleeding may be secondary to anastomosis site versus colitis versus hemorrhoidal.   PLAN   Since she has noted clinical improvement in her stool frequency, bleeding, abdominal pain we will give her a few more days to see how she does.  If she has persistent bleeding she may require sigmoidoscopy to evaluate surgical anastomosis.  If she has ongoing diarrhea, may require retesting for C. difficile versus empirically treat. Patient voiced understanding and will reach out with a progress report in the next 5-7 days or sooner if symptoms worsen.  She requested refill of hydroxyzine used for itching.    Laureen Ochs. Bobby Rumpf, [place], PA-C [facility]  I have reviewed the note and agree with the APP's assessment as described in this progress note  Maylon Peppers, MD Gastroenterology and Hepatology [facility]

## 2022-04-08 NOTE — Patient Instructions (Signed)
I have refilled your hydroxyzine today. Please continue to monitor your symptoms, please touch base with me within the next 5-7 days and let me know how you are doing. If you continue to have loose stool we may need to recheck for persistent C.diff. If persistent bleeding, we may need to do sigmoidoscopy to look at your surgical site to see if bleeding.  If you need to speak to my CMA, Tammy, please call her number directly at [phone]. She is on vacation this week until Friday but you can call Loma Sousa at [phone] if you need something sooner.

## 2022-04-22 ENCOUNTER — Telehealth: Payer: Self-pay

## 2022-04-22 DIAGNOSIS — A09 Infectious gastroenteritis and colitis, unspecified: Secondary | ICD-10-CM

## 2022-04-22 NOTE — Telephone Encounter (Signed)
Pt called stating that she is progressing and that she is doing better. Pt states that some of the symptoms are still there but that they are getting better. Pt is wanting to know if she should make an appt. Please advise.

## 2022-04-24 NOTE — Telephone Encounter (Signed)
Tammy, what symptoms is she still having?  Number of stools per day? Is she still having blood in the stool? Is she still having left lower abdominal pain?

## 2022-04-24 NOTE — Telephone Encounter (Signed)
Let's have her check another stool to make sure she does not still have Cdiff. I have placed order but you may need to release.   If Cdiff negative, then she will need endoscopic evaluation as previously discussed.

## 2022-04-24 NOTE — Addendum Note (Signed)
Addended by: Mahala Menghini on: 04/24/2022 12:12 PM   Modules accepted: Orders

## 2022-04-24 NOTE — Telephone Encounter (Signed)
Pt states that she is still having some blood in her stools. States that there is a lot of mucous that is mostly clear but has a little blood in it at times. States that aprox. A tablespoon of mucous will come out at times when she stands up. The cramping/pain is still there but states that it is better. Pt states that she has 7 to 8 stools a day and that includes the times that she is waking up in the middle of the night to go. Pt states that all the symptoms are still there but have improved since her ov.

## 2022-05-03 NOTE — Telephone Encounter (Signed)
Tammy I'm not sure that I routed this to you. I will send patient mychart message.

## 2022-05-07 NOTE — Telephone Encounter (Signed)
Noted  

## 2022-05-09 ENCOUNTER — Telehealth: Payer: Self-pay

## 2022-05-09 ENCOUNTER — Telehealth: Payer: Self-pay | Admitting: *Deleted

## 2022-05-09 DIAGNOSIS — M25552 Pain in left hip: Secondary | ICD-10-CM | POA: Diagnosis not present

## 2022-05-09 DIAGNOSIS — K921 Melena: Secondary | ICD-10-CM

## 2022-05-09 DIAGNOSIS — A09 Infectious gastroenteritis and colitis, unspecified: Secondary | ICD-10-CM | POA: Diagnosis not present

## 2022-05-09 NOTE — Telephone Encounter (Signed)
A surgical clearance from [facility] was received to be filled out. They are requesting surgical clearance on this pt for a left total hip arthroplasty. Form is in your basket.

## 2022-05-09 NOTE — Telephone Encounter (Signed)
Pre-operative Risk Assessment    Patient Name: [name]  DOB: [birth year] MRN: [id number]      Request for Surgical Clearance    Procedure:   LEFT TOTAL HIP ARTHROPLASTY  Date of Surgery:  Clearance TBD                                 Surgeon:  DR. DANIEL MARCHWIANY Surgeon's Group or Practice Name:  [facility] Phone number:  [phone] [address] Fax number:  [phone]   Type of Clearance Requested:   - Medical ; ASA    Type of Anesthesia:  Spinal   Additional requests/questions:    [name]   05/09/2022, 6:02 PM

## 2022-05-09 NOTE — Telephone Encounter (Signed)
[name], last communication with the patient, she was still having diarrhea, bloody mucous in her stool after colon surgery. Treated for Cdiff. I asked for her to repeat Cdiff but this has not been done. Can we have her recheck that. If negative, and she continues to have bloody mucous, then will need flex sig to look at her anastomosis.

## 2022-05-10 ENCOUNTER — Telehealth: Payer: Self-pay | Admitting: *Deleted

## 2022-05-10 NOTE — Telephone Encounter (Signed)
Pt states that the lab was taken back to the lab yesterday. Pt states that the blood and pain aren't nearly as bad. Stools are now formed. Pt states that there is still a lot of mucous in her stools.

## 2022-05-10 NOTE — Telephone Encounter (Signed)
Pt scheduled for tele pre op appt 05/16/22 @ 10:20. Med rec and consent are done.     Patient Consent for Virtual Visit        [name] has provided verbal consent on 05/10/2022 for a virtual visit (video or telephone).   CONSENT FOR VIRTUAL VISIT FOR:  [name]  By participating in this virtual visit I agree to the following:  I hereby voluntarily request, consent and authorize [facility] and its employed or contracted physicians, physician assistants, nurse practitioners or other licensed health care professionals (the Practitioner), to provide me with telemedicine health care services (the "Services") as deemed necessary by the treating Practitioner. I acknowledge and consent to receive the Services by the Practitioner via telemedicine. I understand that the telemedicine visit will involve communicating with the Practitioner through live audiovisual communication technology and the disclosure of certain medical information by electronic transmission. I acknowledge that I have been given the opportunity to request an in-person assessment or other available alternative prior to the telemedicine visit and am voluntarily participating in the telemedicine visit.  I understand that I have the right to withhold or withdraw my consent to the use of telemedicine in the course of my care at any time, without affecting my right to future care or treatment, and that the Practitioner or I may terminate the telemedicine visit at any time. I understand that I have the right to inspect all information obtained and/or recorded in the course of the telemedicine visit and may receive copies of available information for a reasonable fee.  I understand that some of the potential risks of receiving the Services via telemedicine include:  Delay or interruption in medical evaluation due to technological equipment failure or disruption; Information transmitted may not be sufficient (e.g. poor  resolution of images) to allow for appropriate medical decision making by the Practitioner; and/or  In rare instances, security protocols could fail, causing a breach of personal health information.  Furthermore, I acknowledge that it is my responsibility to provide information about my medical history, conditions and care that is complete and accurate to the best of my ability. I acknowledge that Practitioner's advice, recommendations, and/or decision may be based on factors not within their control, such as incomplete or inaccurate data provided by me or distortions of diagnostic images or specimens that may result from electronic transmissions. I understand that the practice of medicine is not an exact science and that Practitioner makes no warranties or guarantees regarding treatment outcomes. I acknowledge that a copy of this consent can be made available to me via my patient portal ([facility]), or I can request a printed copy by calling the office of [place].    I understand that my insurance will be billed for this visit.   I have read or had this consent read to me. I understand the contents of this consent, which adequately explains the benefits and risks of the Services being provided via telemedicine.  I have been provided ample opportunity to ask questions regarding this consent and the Services and have had my questions answered to my satisfaction. I give my informed consent for the services to be provided through the use of telemedicine in my medical care

## 2022-05-10 NOTE — Telephone Encounter (Signed)
Name: [name]  DOB: March 14, 1956  MRN: [id number]  Primary Cardiologist: Nona Dell, MD   Preoperative team, please contact this patient and set up a phone call appointment for further preoperative risk assessment. Please obtain consent and complete medication review. Thank you for your help.  I confirm that guidance regarding antiplatelet and oral anticoagulation therapy has been completed and, if necessary, noted below.  She can hold Aspirin for 5-7 days if needed prior to surgery but would restart this as soon as safely possible postoperatively.   Napoleon Form, Leodis Rains, NP 05/10/2022, 9:16 AM [facility]

## 2022-05-10 NOTE — Telephone Encounter (Signed)
Pt scheduled for tele pre op appt 05/16/22 @ 10:20. Med rec and consent are done.

## 2022-05-11 LAB — C DIFFICILE TOXINS A+B W/RFLX: C difficile Toxins A+B, EIA: POSITIVE — AB

## 2022-05-13 ENCOUNTER — Other Ambulatory Visit: Payer: Self-pay | Admitting: Gastroenterology

## 2022-05-13 MED ORDER — DIFICID 200 MG PO TABS: 200.0000 mg | ORAL_TABLET | Freq: Two times a day (BID) | ORAL | 0 refills | Status: DC

## 2022-05-13 MED ORDER — VANCOMYCIN HCL 125 MG PO CAPS: ORAL_CAPSULE | ORAL | 0 refills | Status: DC

## 2022-05-14 NOTE — Telephone Encounter (Signed)
I spoke to patient earlier today and we discussed possibility of flex sig due to ongoing rectal bleeding and small clots. She reports that her stools are now solid but still passing multiple stools daily, every time she urinates. Passing less blood, her llq pain is less and intermittent. Still passing a lot of mucous. Reminds me that chronically even before her sigmoid resection that she was having some brbpr and frequent stools.   Discussed with Dr. Jenetta Downer, may consider flex sig to look at her anastomosis. He is requesting that she call in 2 weeks and give progress report. If still passing blood, then he wants to do flex sig then. This will give her time to get at least two weeks of vancomycin done for her cdiff. She will complete entire taper as discussed. Hopefully she should be cleared for surgery for her hip replacement in 8 weeks if her diarrhea resolves.   Please have her call me in 2 weeks (05/27/22), I would like to take her progress report personally at that time.

## 2022-05-14 NOTE — Telephone Encounter (Signed)
Pt was made aware and verbalized understanding.  

## 2022-05-15 NOTE — Progress Notes (Signed)
Virtual Visit via Telephone Note   Because of Tyrielle Vallas Crossley's co-morbid illnesses, she is at least at moderate risk for complications without adequate follow up.  This format is felt to be most appropriate for this patient at this time.  The patient did not have access to video technology/had technical difficulties with video requiring transitioning to audio format only (telephone).  All issues noted in this document were discussed and addressed.  No physical exam could be performed with this format.  Please refer to the patient's chart for her consent to telehealth for [facility].  Evaluation Performed:  Preoperative cardiovascular risk assessment _____________   Date:  05/16/2022   Patient ID:  [name], DOB 07-Aug-1955, MRN [id number] Patient Location:  Home Provider location:   Office  Primary Care Provider:  Carylon Perches, MD Primary Cardiologist:  Nona Dell, MD  Chief Complaint / Patient Profile   67 y.o. y/o female with a h/o mild nonobstructive CAD noted on remote cardiac catheterization 2005 and normal Myoview 2004, paroxysmal SVT, severe COPD with chronic hypoxic respiratory failure on O2 at home, hyperlipidemia, obstructive sleep apnea, and diverticulitis who is pending left total hip arthroplasty and presents today for telephonic preoperative cardiovascular risk assessment.  History of Present Illness    Cynthia VELEY is a 67 y.o. female who presents via audio/video conferencing for a telehealth visit today.  Pt was last seen in cardiology clinic on 11/12/21 by Marjie Skiff, PA. At that time SKILAH HERMIZ was advised to undergo nuclear stress test for symptoms of vague chest pain, chronic dyspnea, and limited activity level. NST 11/15/2021 was low risk with no evidence of ischemia or infarction, EF 78% . The patient is now pending procedure as outlined above. Since her last visit, she denies chest pain, shortness of breath, lower extremity edema, fatigue,  palpitations, melena, hematuria, hemoptysis, diaphoresis, weakness, presyncope, syncope, orthopnea, and PND. Had resection for sigmoid colon stricture October 2023, doing well from that standpoint. Hip pain is restricting movement, she is able to walk with a walker and with her oxygen she is able to walk laps around the inside of her home.   Past Medical History    Past Medical History:  Diagnosis Date   Allergy    Anxiety    Arthritis    COPD (chronic obstructive pulmonary disease) (HCC)    Home oxygen at [facility]   Depression    Difficult intubation 02/07/2022   Diverticulosis    Dyspnea    Dysrhythmia    History of cardiac catheterization    No significant CAD 2005   Hyperlipidemia    Hypoglycemia    IBS (irritable bowel syndrome)    Neuromuscular disorder (HCC)    Osteoporosis    Paroxysmal SVT (supraventricular tachycardia)    Pneumonia 2015   Pre-diabetes    Shingles 2012   Sleep apnea    Past Surgical History:  Procedure Laterality Date   BACK SURGERY     BIOPSY  09/12/2021   Procedure: BIOPSY;  Surgeon: Malissa Hippo, MD;  [facility];  Service: Endoscopy;;   BREAST SURGERY     CARDIAC CATHETERIZATION  2004   COLONOSCOPY  03/22/2011   Procedure: COLONOSCOPY;  Surgeon: Malissa Hippo, MD;  [facility];  Service: Endoscopy;  Laterality: N/A;   COLONOSCOPY WITH PROPOFOL N/A 05/16/2017   Procedure: COLONOSCOPY WITH PROPOFOL;  Surgeon: Malissa Hippo, MD;  [facility];  Service: Endoscopy;  Laterality:  N/A;   COLONOSCOPY WITH PROPOFOL N/A 09/12/2021   Procedure: COLONOSCOPY WITH PROPOFOL;  Surgeon: Malissa Hippo, MD;  [facility];  Service: Endoscopy;  Laterality: N/A;  1230 ASA 2   ESOPHAGEAL DILATION N/A 04/19/2020   Procedure: ESOPHAGEAL DILATION;  Surgeon: Malissa Hippo, MD;  [facility];  Service: Endoscopy;  Laterality: N/A;   ESOPHAGOGASTRODUODENOSCOPY (EGD) WITH PROPOFOL N/A 04/19/2020    Procedure: ESOPHAGOGASTRODUODENOSCOPY (EGD) WITH PROPOFOL;  Surgeon: Malissa Hippo, MD;  [facility];  Service: Endoscopy;  Laterality: N/A;  9:15   ESOPHAGOGASTRODUODENOSCOPY (EGD) WITH PROPOFOL N/A 09/12/2021   Procedure: ESOPHAGOGASTRODUODENOSCOPY (EGD) WITH PROPOFOL;  Surgeon: Malissa Hippo, MD;  [facility];  Service: Endoscopy;  Laterality: N/A;   FLEXIBLE SIGMOIDOSCOPY N/A 09/26/2017   Procedure: FLEXIBLE SIGMOIDOSCOPY with Propofol ;  Surgeon: Malissa Hippo, MD;  [facility];  Service: Endoscopy;  Laterality: N/A;  9:30   OSTOMY N/A 02/07/2022   Procedure: POSSIBLE OSTOMY;  Surgeon: Karie Soda, MD;  [facility];  Service: General;  Laterality: N/A;   POLYPECTOMY  05/16/2017   Procedure: POLYPECTOMY;  Surgeon: Malissa Hippo, MD;  [facility];  Service: Endoscopy;;   POLYPECTOMY  09/12/2021   Procedure: POLYPECTOMY INTESTINAL;  Surgeon: Malissa Hippo, MD;  [facility];  Service: Endoscopy;;   PROCTOSCOPY N/A 02/07/2022   Procedure: RIGID PROCTOSCOPY;  Surgeon: Karie Soda, MD;  [facility];  Service: General;  Laterality: N/A;   SPINAL FUSION     VULVECTOMY      Allergies  Allergies  Allergen Reactions   Iohexol Anaphylaxis and Other (See Comments)     Desc: IV DYE  ANAPHYLATIC SHOCK X2 ONCE AFTER PREMEDS    Adhesive [Tape] Itching and Other (See Comments)    Break out   Ketek [Telithromycin] Other (See Comments)    Loopy, eyes went different directions   Penicillins Other (See Comments)    Unknown Has patient had a PCN reaction causing immediate rash, facial/tongue/throat swelling, SOB or lightheadedness with hypotension: Unknown Has patient had a PCN reaction causing severe rash involving mucus membranes or skin necrosis: Unknown Has patient had a PCN reaction that required hospitalization: Unknown Has patient had a PCN reaction occurring within the last 10 years: No If all of the above  answers are "NO", then may proceed with Cephalosporin use.   Chantix [Varenicline] Palpitations    Home Medications    Prior to Admission medications   Medication Sig Start Date End Date Taking? Authorizing Provider  albuterol (VENTOLIN HFA) 108 (90 Base) MCG/ACT inhaler Inhale 1-2 puffs into the lungs every 6 (six) hours as needed for wheezing or shortness of breath.    [provider]  ALPRAZolam Prudy Feeler) 0.25 MG tablet Take 0.25 mg by mouth 3 (three) times daily as needed for anxiety.  06/17/16   Kari Baars, MD  aspirin 81 MG tablet Take 1 tablet (81 mg total) by mouth daily. 09/13/21   Rehman, Joline Maxcy, MD  atorvastatin (LIPITOR) 40 MG tablet TAKE 1 TABLET DAILY Patient taking differently: Take 40 mg by mouth at bedtime. 05/21/21   Jonelle Sidle, MD  azelastine (ASTELIN) 0.1 % nasal spray Place 1 spray into both nostrils daily as needed for rhinitis or allergies. Use in each nostril as directed    [provider]  carboxymethylcellulose 1 % ophthalmic solution Place 2 drops into both eyes 3 (three) times daily as needed (for dry  eyes).    [provider]  cetirizine (ZYRTEC) 10 MG tablet Take 10 mg by mouth daily.    [provider]  clonazePAM (KLONOPIN) 0.5 MG tablet Take 1 tablet (0.5 mg total) by mouth at bedtime. Patient taking differently: Take 0.5 mg by mouth at bedtime as needed for anxiety. 02/14/20   Coralyn Helling, MD  diclofenac Sodium (VOLTAREN) 1 % GEL Apply 2 g topically 2 (two) times daily as needed (pain).    [provider]  dicyclomine (BENTYL) 10 MG capsule Take 1 capsule (10 mg total) by mouth 3 (three) times daily as needed for spasms. 08/06/21   Carlan, Chelsea L, NP  diltiazem (CARDIZEM) 30 MG tablet Take 1 tablet (30 mg total) by mouth 3 (three) times daily. Take 1-2 tablets every 6 hours as needed for persistent tachycardai 02/13/22   Jonelle Sidle, MD  diltiazem (DILACOR XR) 240 MG 24 hr capsule Take 240 mg by  mouth daily. 11/21/16   [provider]  escitalopram (LEXAPRO) 20 MG tablet Take 20 mg by mouth daily.    [provider]  fluticasone (FLONASE) 50 MCG/ACT nasal spray Place 1 spray into both nostrils daily as needed for allergies.    [provider]  Fluticasone-Umeclidin-Vilant (TRELEGY ELLIPTA) 100-62.5-25 MCG/ACT AEPB Inhale 1 puff into the lungs daily.    [provider]  hydrOXYzine (ATARAX) 25 MG tablet Take 0.5 tablets (12.5 mg total) by mouth every 6 (six) hours as needed for itching. Patient taking differently: Take 25 mg by mouth every 6 (six) hours as needed for itching. 04/08/22   Tiffany Kocher, PA-C  ibuprofen (ADVIL) 200 MG tablet Take 400 mg by mouth every 6 (six) hours as needed for moderate pain.    [provider]  methocarbamol (ROBAXIN) 500 MG tablet Take 1,000 mg by mouth every 6 (six) hours as needed.    [provider]  montelukast (SINGULAIR) 10 MG tablet Take 1 tablet (10 mg total) by mouth at bedtime. 01/26/15   Erick Blinks, MD  NON FORMULARY Pt uses a bipap with oxygen when laying down    [provider]  oxybutynin (DITROPAN) 5 MG tablet Take 5 mg by mouth daily.  08/28/13   [provider]  OXYGEN Inhale 4 L into the lungs continuous.    [provider]  promethazine (PHENERGAN) 12.5 MG tablet Take by mouth. 02/25/22   [provider]  roflumilast (DALIRESP) 500 MCG TABS tablet Take 500 mcg by mouth daily.    [provider]  traMADol (ULTRAM) 50 MG tablet Take 1 tablet (50 mg total) by mouth every 6 (six) hours as needed for moderate pain. 02/11/22   Karie Soda, MD  traZODone (DESYREL) 50 MG tablet Take 50 mg by mouth at bedtime as needed for sleep.    [provider]  valACYclovir (VALTREX) 1000 MG tablet Take 1,000 mg by mouth daily as needed (outbreaks). MAINTAINED DOSE OF 500 MG DAILY 12/31/21   [provider]  vancomycin (VANCOCIN) 125 MG  capsule Take 125mg  po QID for 14 days, then 125mg  BID for 7 days, then 125mg  daily for 7 days, then 125 mg every 2 days for four weeks, then stop. 05/13/22   Tiffany Kocher, PA-C    Physical Exam    Vital Signs:  NIDHI MECCIA does not have vital signs available for review today.  Given telephonic nature of communication, physical exam is limited. AAOx3. NAD. Normal affect.  Speech and respirations  are unlabored.  Accessory Clinical Findings    None  Assessment & Plan    1.  Preoperative Cardiovascular Risk Assessment: The patient is doing well from a cardiac perspective. Therefore, based on ACC/AHA guidelines, the patient would be at acceptable risk for the planned procedure without further cardiovascular testing. According to the Revised Cardiac Risk Index (RCRI), her Perioperative Risk of Major Cardiac Event is (%): 0.9 Her Functional Capacity in METs is: 4.4 according to the Duke Activity Status Index (DASI).  The patient was advised that if she develops new symptoms prior to surgery to contact our office to arrange for a follow-up visit, and she verbalized understanding.  She may hold aspirin for 5 to 7 days prior to surgery with recommendation to restart as soon as safely possible postoperatively. A copy of this note will be routed to requesting surgeon.  Time:   Today, I have spent 10 minutes with the patient with telehealth technology discussing medical history, symptoms, and management plan.     Levi Aland, NP-C  05/16/2022, 10:22 AM [address] Office [phone] Fax [phone]

## 2022-05-16 ENCOUNTER — Ambulatory Visit: Payer: Medicare Other | Attending: Cardiology | Admitting: Nurse Practitioner

## 2022-05-16 ENCOUNTER — Encounter: Payer: Self-pay | Admitting: Nurse Practitioner

## 2022-05-16 DIAGNOSIS — Z01810 Encounter for preprocedural cardiovascular examination: Secondary | ICD-10-CM

## 2022-05-27 ENCOUNTER — Ambulatory Visit: Payer: Medicare Other | Admitting: Gastroenterology

## 2022-05-28 NOTE — Telephone Encounter (Signed)
LMOVM for pt to call back 

## 2022-05-28 NOTE — Telephone Encounter (Signed)
Please let patient know that Dr. Jenetta Downer is recommending a flex sig. She still needs to get CBC as advised. He also states she needs to stop ibuprofen (or other NSAIDS).   Schedule flex sig. Dx: rectal bleeding, frequent stools, h/o rectosigmoid resection in 02/2022.

## 2022-05-28 NOTE — Telephone Encounter (Signed)
Sorry she is ASA 3.

## 2022-05-28 NOTE — Telephone Encounter (Signed)
Pt was made aware and is ready to move forward with scheduling.

## 2022-05-28 NOTE — Telephone Encounter (Signed)
I think it is reasonable to do a flex sig, needs to avoid NSAIDs

## 2022-05-28 NOTE — Telephone Encounter (Signed)
Spoke with patient. Completed first 14 days of vancomycin for Cdiff. Starts taper tomorrow. Overall her "gut" feels the best it has in years but she is still having frequent stools and nocturnal stools. At least 6-7 soft stools daily, when she flushes the stools become loose/break up. She has noted stools are very dark/black since on vanc. No Pepto or bismuth products. No abdominal pain. Less mucous. She can't tell if there is any further blood in the stools because they are so dark.   She has been taking Ibuprofen '200mg'$  two per day along with other pain medications for her hip pain. Pending hip replacement, not yet scheduled.   Will send for CBC given the dark/black stools.   Will ask Dr. Jenetta Downer if we need to proceed with flex sig to look at anastomosis?

## 2022-05-28 NOTE — Telephone Encounter (Signed)
Pt called back. Scheduled for 1/30 at 1030am. Aware will send instructions/pre-op to [facility]

## 2022-05-28 NOTE — Telephone Encounter (Signed)
What is her ASA?

## 2022-05-28 NOTE — Addendum Note (Signed)
Addended by: Mahala Menghini on: 05/28/2022 12:34 PM   Modules accepted: Orders

## 2022-05-30 ENCOUNTER — Other Ambulatory Visit: Payer: Self-pay

## 2022-05-30 ENCOUNTER — Encounter (HOSPITAL_COMMUNITY): Payer: Self-pay

## 2022-05-30 ENCOUNTER — Encounter (HOSPITAL_COMMUNITY)
Admission: RE | Admit: 2022-05-30 | Discharge: 2022-05-30 | Disposition: A | Discharge status: Home / Self Care | Payer: Medicare Other | Source: Ambulatory Visit | Attending: Gastroenterology | Admitting: Gastroenterology

## 2022-05-30 DIAGNOSIS — K921 Melena: Secondary | ICD-10-CM | POA: Diagnosis not present

## 2022-05-31 ENCOUNTER — Other Ambulatory Visit (HOSPITAL_COMMUNITY): Payer: Medicare Other

## 2022-05-31 LAB — CBC WITH DIFFERENTIAL/PLATELET
Basophils Absolute: 0 10*3/uL (ref 0.0–0.2)
Basos: 0 %
EOS (ABSOLUTE): 0.3 10*3/uL (ref 0.0–0.4)
Eos: 3 %
Hematocrit: 36.9 % (ref 34.0–46.6)
Hemoglobin: 12 g/dL (ref 11.1–15.9)
Immature Grans (Abs): 0 10*3/uL (ref 0.0–0.1)
Immature Granulocytes: 0 %
Lymphocytes Absolute: 4.2 10*3/uL — ABNORMAL HIGH (ref 0.7–3.1)
Lymphs: 36 %
MCH: 29.5 pg (ref 26.6–33.0)
MCHC: 32.5 g/dL (ref 31.5–35.7)
MCV: 91 fL (ref 79–97)
Monocytes Absolute: 0.8 10*3/uL (ref 0.1–0.9)
Monocytes: 7 %
Neutrophils Absolute: 6.2 10*3/uL (ref 1.4–7.0)
Neutrophils: 54 %
Platelets: 362 10*3/uL (ref 150–450)
RBC: 4.07 x10E6/uL (ref 3.77–5.28)
RDW: 12.7 % (ref 11.7–15.4)
WBC: 11.6 10*3/uL — ABNORMAL HIGH (ref 3.4–10.8)

## 2022-06-02 NOTE — Telephone Encounter (Signed)
Clearance completed for murphy wainer

## 2022-06-04 ENCOUNTER — Encounter (INDEPENDENT_AMBULATORY_CARE_PROVIDER_SITE_OTHER): Payer: Self-pay | Admitting: *Deleted

## 2022-06-04 ENCOUNTER — Ambulatory Visit (HOSPITAL_COMMUNITY): Payer: Medicare Other | Admitting: Anesthesiology

## 2022-06-04 ENCOUNTER — Encounter (HOSPITAL_COMMUNITY)
Admission: RE | Disposition: A | Discharge status: Home / Self Care | Payer: Self-pay | Source: Home / Self Care | Attending: Gastroenterology

## 2022-06-04 ENCOUNTER — Ambulatory Visit (HOSPITAL_BASED_OUTPATIENT_CLINIC_OR_DEPARTMENT_OTHER): Payer: Medicare Other | Admitting: Anesthesiology

## 2022-06-04 ENCOUNTER — Ambulatory Visit (HOSPITAL_COMMUNITY)
Admission: RE | Admit: 2022-06-04 | Discharge: 2022-06-04 | Disposition: A | Discharge status: Home / Self Care | Payer: Medicare Other | Attending: Gastroenterology | Admitting: Gastroenterology

## 2022-06-04 DIAGNOSIS — Z980 Intestinal bypass and anastomosis status: Secondary | ICD-10-CM | POA: Diagnosis not present

## 2022-06-04 DIAGNOSIS — F419 Anxiety disorder, unspecified: Secondary | ICD-10-CM | POA: Diagnosis not present

## 2022-06-04 DIAGNOSIS — K219 Gastro-esophageal reflux disease without esophagitis: Secondary | ICD-10-CM | POA: Insufficient documentation

## 2022-06-04 DIAGNOSIS — K580 Irritable bowel syndrome with diarrhea: Secondary | ICD-10-CM | POA: Diagnosis not present

## 2022-06-04 DIAGNOSIS — Z9981 Dependence on supplemental oxygen: Secondary | ICD-10-CM | POA: Insufficient documentation

## 2022-06-04 DIAGNOSIS — K5730 Diverticulosis of large intestine without perforation or abscess without bleeding: Secondary | ICD-10-CM | POA: Diagnosis not present

## 2022-06-04 DIAGNOSIS — Z87891 Personal history of nicotine dependence: Secondary | ICD-10-CM | POA: Insufficient documentation

## 2022-06-04 DIAGNOSIS — E785 Hyperlipidemia, unspecified: Secondary | ICD-10-CM | POA: Diagnosis not present

## 2022-06-04 DIAGNOSIS — K5731 Diverticulosis of large intestine without perforation or abscess with bleeding: Secondary | ICD-10-CM

## 2022-06-04 DIAGNOSIS — J449 Chronic obstructive pulmonary disease, unspecified: Secondary | ICD-10-CM | POA: Diagnosis not present

## 2022-06-04 DIAGNOSIS — K921 Melena: Secondary | ICD-10-CM | POA: Diagnosis not present

## 2022-06-04 DIAGNOSIS — M1990 Unspecified osteoarthritis, unspecified site: Secondary | ICD-10-CM | POA: Diagnosis not present

## 2022-06-04 DIAGNOSIS — I471 Supraventricular tachycardia, unspecified: Secondary | ICD-10-CM | POA: Diagnosis not present

## 2022-06-04 DIAGNOSIS — K56699 Other intestinal obstruction unspecified as to partial versus complete obstruction: Secondary | ICD-10-CM

## 2022-06-04 DIAGNOSIS — G4730 Sleep apnea, unspecified: Secondary | ICD-10-CM | POA: Insufficient documentation

## 2022-06-04 DIAGNOSIS — K633 Ulcer of intestine: Secondary | ICD-10-CM | POA: Diagnosis not present

## 2022-06-04 DIAGNOSIS — F32A Depression, unspecified: Secondary | ICD-10-CM | POA: Insufficient documentation

## 2022-06-04 DIAGNOSIS — K625 Hemorrhage of anus and rectum: Secondary | ICD-10-CM

## 2022-06-04 DIAGNOSIS — K529 Noninfective gastroenteritis and colitis, unspecified: Secondary | ICD-10-CM

## 2022-06-04 HISTORY — PX: FLEXIBLE SIGMOIDOSCOPY: SHX5431

## 2022-06-04 SURGERY — SIGMOIDOSCOPY, FLEXIBLE
Anesthesia: General

## 2022-06-04 MED ORDER — PROPOFOL 500 MG/50ML IV EMUL
INTRAVENOUS | Status: DC | PRN
  Administered 2022-06-04: 75 ug/kg/min via INTRAVENOUS

## 2022-06-04 MED ORDER — PROPOFOL 10 MG/ML IV BOLUS
INTRAVENOUS | Status: DC | PRN
  Administered 2022-06-04: 100 mg via INTRAVENOUS
  Administered 2022-06-04: 40 mg via INTRAVENOUS
  Administered 2022-06-04: 20 mg via INTRAVENOUS

## 2022-06-04 MED ORDER — LACTATED RINGERS IV SOLN: INTRAVENOUS | Status: DC

## 2022-06-04 NOTE — Addendum Note (Signed)
Addendum  created 06/04/22 1333 by Denese Killings, MD   Clinical Note Signed

## 2022-06-04 NOTE — Transfer of Care (Signed)
Immediate Anesthesia Transfer of Care Note  Patient: LAQUANA VILLARI  Procedure(s) Performed: FLEXIBLE SIGMOIDOSCOPY  Patient Location: Short Stay  Anesthesia Type:General  Level of Consciousness: awake and patient cooperative  Airway & Oxygen Therapy: Patient Spontanous Breathing  Post-op Assessment: Report given to RN and Post -op Vital signs reviewed and stable  Post vital signs: Reviewed and stable  Last Vitals:  Vitals Value Taken Time  BP 93/47 06/04/22 1004  Temp 36.4 C 06/04/22 1004  Pulse 83 06/04/22 1004  Resp 19 06/04/22 1004  SpO2 97 % 06/04/22 1004    Last Pain:  Vitals:   06/04/22 1004  TempSrc: Oral  PainSc: 4          Complications: No notable events documented.

## 2022-06-04 NOTE — Discharge Instructions (Addendum)
You are being discharged to home.  Resume your previous diet.  Patient cleared from gastrointestinal perspective to undergo orthopedic surgery. Do not take any ibuprofen (including Advil, Motrin or Nuprin), naproxen, or other non-steroidal anti-inflammatory drugs.

## 2022-06-04 NOTE — H&P (Signed)
[name] is an 67 y.o. female.   Chief Complaint: Diarrhea HPI: 67 year old female with past medical history of depression, anxiety, COPD on oxygen 4 L, neuromuscular disorder, IBS, hyperlipidemia, paroxysmal SVT and history of colon stricture status post resection complicated by C. difficile, who comes for evaluation of chronic diarrhea.  States that after her treatment with vancomycin her stools have become more formed, but still having 6-7 BM per day. No blood in stool or soft Bms.  Past Medical History:  Diagnosis Date   Allergy    Anxiety    Arthritis    COPD (chronic obstructive pulmonary disease) ([place])    Home oxygen at [facility]   Depression    Diverticulosis    Dyspnea    Dysrhythmia    History of cardiac catheterization    No significant CAD 2005   Hyperlipidemia    Hypoglycemia    IBS (irritable bowel syndrome)    Neuromuscular disorder (HCC)    Osteoporosis    Paroxysmal SVT (supraventricular tachycardia)    Pneumonia 2015   Pre-diabetes    Shingles 2012   Sleep apnea     Past Surgical History:  Procedure Laterality Date   BACK SURGERY     BIOPSY  09/12/2021   Procedure: BIOPSY;  Surgeon: Rogene Houston, MD;  [facility];  Service: Endoscopy;;   BREAST SURGERY     CARDIAC CATHETERIZATION  2004   COLONOSCOPY  03/22/2011   Procedure: COLONOSCOPY;  Surgeon: Rogene Houston, MD;  [facility];  Service: Endoscopy;  Laterality: N/A;   COLONOSCOPY WITH PROPOFOL N/A 05/16/2017   Procedure: COLONOSCOPY WITH PROPOFOL;  Surgeon: Rogene Houston, MD;  [facility];  Service: Endoscopy;  Laterality: N/A;   COLONOSCOPY WITH PROPOFOL N/A 09/12/2021   Procedure: COLONOSCOPY WITH PROPOFOL;  Surgeon: Rogene Houston, MD;  [facility];  Service: Endoscopy;  Laterality: N/A;  1230 ASA 2   ESOPHAGEAL DILATION N/A 04/19/2020   Procedure: ESOPHAGEAL DILATION;  Surgeon: Rogene Houston, MD;  [facility];  Service:  Endoscopy;  Laterality: N/A;   ESOPHAGOGASTRODUODENOSCOPY (EGD) WITH PROPOFOL N/A 04/19/2020   Procedure: ESOPHAGOGASTRODUODENOSCOPY (EGD) WITH PROPOFOL;  Surgeon: Rogene Houston, MD;  [facility];  Service: Endoscopy;  Laterality: N/A;  9:15   ESOPHAGOGASTRODUODENOSCOPY (EGD) WITH PROPOFOL N/A 09/12/2021   Procedure: ESOPHAGOGASTRODUODENOSCOPY (EGD) WITH PROPOFOL;  Surgeon: Rogene Houston, MD;  [facility];  Service: Endoscopy;  Laterality: N/A;   FLEXIBLE SIGMOIDOSCOPY N/A 09/26/2017   Procedure: FLEXIBLE SIGMOIDOSCOPY with Propofol ;  Surgeon: Rogene Houston, MD;  [facility];  Service: Endoscopy;  Laterality: N/A;  9:30   OSTOMY N/A 02/07/2022   Procedure: POSSIBLE OSTOMY;  Surgeon: Michael Boston, MD;  [facility];  Service: General;  Laterality: N/A;   POLYPECTOMY  05/16/2017   Procedure: POLYPECTOMY;  Surgeon: Rogene Houston, MD;  [facility];  Service: Endoscopy;;   POLYPECTOMY  09/12/2021   Procedure: POLYPECTOMY INTESTINAL;  Surgeon: Rogene Houston, MD;  [facility];  Service: Endoscopy;;   PROCTOSCOPY N/A 02/07/2022   Procedure: RIGID PROCTOSCOPY;  Surgeon: Michael Boston, MD;  [facility];  Service: General;  Laterality: N/A;   SPINAL FUSION     VULVECTOMY      Family History  Problem Relation Age of Onset   Asthma Mother    Heart attack Mother    Diabetes Mother    Heart  failure Mother    Colon cancer Neg Hx    Social History:  reports that she quit smoking about 7 years ago. Her smoking use included cigarettes. She has a 40.00 pack-year smoking history. She has been exposed to tobacco smoke. She has never used smokeless tobacco. She reports current alcohol use. She reports that she does not use drugs.  Allergies:  Allergies  Allergen Reactions   Iohexol Anaphylaxis and Other (See Comments)     Desc: IV DYE  ANAPHYLATIC SHOCK X2 ONCE AFTER PREMEDS    Ketek [Telithromycin] Other (See Comments)     Loopy, eyes went different directions   Penicillins Other (See Comments)    Unknown Has patient had a PCN reaction causing immediate rash, facial/tongue/throat swelling, SOB or lightheadedness with hypotension: Unknown Has patient had a PCN reaction causing severe rash involving mucus membranes or skin necrosis: Unknown Has patient had a PCN reaction that required hospitalization: Unknown Has patient had a PCN reaction occurring within the last 10 years: No If all of the above answers are "NO", then may proceed with Cephalosporin use.   Adhesive [Tape] Itching and Rash    Break out   Chantix [Varenicline] Palpitations    Medications Prior to Admission  Medication Sig Dispense Refill   albuterol (VENTOLIN HFA) 108 (90 Base) MCG/ACT inhaler Inhale 1-2 puffs into the lungs every 6 (six) hours as needed for wheezing or shortness of breath.     ALPRAZolam (XANAX) 0.25 MG tablet Take 0.25 mg by mouth 3 (three) times daily as needed for anxiety.      aspirin 81 MG tablet Take 1 tablet (81 mg total) by mouth daily. 100 tablet 3   atorvastatin (LIPITOR) 40 MG tablet TAKE 1 TABLET DAILY (Patient taking differently: Take 40 mg by mouth at bedtime.) 90 tablet 3   azelastine (ASTELIN) 0.1 % nasal spray Place 1 spray into both nostrils daily as needed for rhinitis or allergies. Use in each nostril as directed     carboxymethylcellulose 1 % ophthalmic solution Place 2 drops into both eyes 3 (three) times daily as needed (for dry eyes).     cetirizine (ZYRTEC) 10 MG tablet Take 10 mg by mouth daily.     diclofenac Sodium (VOLTAREN) 1 % GEL Apply 2 g topically 2 (two) times daily as needed (pain).     diltiazem (DILACOR XR) 240 MG 24 hr capsule Take 240 mg by mouth daily.     escitalopram (LEXAPRO) 20 MG tablet Take 20 mg by mouth daily.     fluticasone (FLONASE) 50 MCG/ACT nasal spray Place 1 spray into both nostrils daily as needed for allergies.     Fluticasone-Umeclidin-Vilant (TRELEGY ELLIPTA)  100-62.5-25 MCG/ACT AEPB Inhale 1 puff into the lungs daily.     hydrOXYzine (ATARAX) 25 MG tablet Take 0.5 tablets (12.5 mg total) by mouth every 6 (six) hours as needed for itching. (Patient taking differently: Take 25 mg by mouth every 6 (six) hours as needed for itching.) 30 tablet 0   ibuprofen (ADVIL) 200 MG tablet Take 400 mg by mouth every 6 (six) hours as needed for moderate pain.     montelukast (SINGULAIR) 10 MG tablet Take 1 tablet (10 mg total) by mouth at bedtime. 30 tablet 1   NON FORMULARY Pt uses a bipap with oxygen when laying down     oxybutynin (DITROPAN) 5 MG tablet Take 5 mg by mouth daily.      OXYGEN Inhale 3.5-7 L into the lungs continuous.  roflumilast (DALIRESP) 500 MCG TABS tablet Take 500 mcg by mouth daily.     traMADol (ULTRAM) 50 MG tablet Take 1 tablet (50 mg total) by mouth every 6 (six) hours as needed for moderate pain. 30 tablet 0   traZODone (DESYREL) 50 MG tablet Take 50 mg by mouth at bedtime as needed for sleep.     valACYclovir (VALTREX) 1000 MG tablet Take 500 mg by mouth daily.     vancomycin (VANCOCIN) 125 MG capsule Take '125mg'$  po QID for 14 days, then '125mg'$  BID for 7 days, then '125mg'$  daily for 7 days, then 125 mg every 2 days for four weeks, then stop. 93 capsule 0   clonazePAM (KLONOPIN) 0.5 MG tablet Take 1 tablet (0.5 mg total) by mouth at bedtime. (Patient taking differently: Take 0.5 mg by mouth at bedtime as needed for anxiety.) 30 tablet 5   dicyclomine (BENTYL) 10 MG capsule Take 1 capsule (10 mg total) by mouth 3 (three) times daily as needed for spasms. 90 capsule 2   diltiazem (CARDIZEM) 30 MG tablet Take 1 tablet (30 mg total) by mouth 3 (three) times daily. Take 1-2 tablets every 6 hours as needed for persistent tachycardai (Patient taking differently: Take 30 mg by mouth 3 (three) times daily. Take 1-2 tablets every 6 hours as needed for persistent tachycardia) 90 tablet 3   methocarbamol (ROBAXIN) 500 MG tablet Take 500-1,000 mg by mouth  every 6 (six) hours as needed for muscle spasms.     promethazine (PHENERGAN) 12.5 MG tablet Take 12.5 mg by mouth every 6 (six) hours as needed for vomiting or nausea.      No results found for this or any previous visit (from the past 48 hour(s)). No results found.  Review of Systems  Gastrointestinal:  Positive for diarrhea.    Blood pressure (!) 143/73, pulse 86, temperature (P) 97.9 F (36.6 C), resp. rate 18, SpO2 99 %. Physical Exam  GENERAL: The patient is AO x3, in no acute distress. HEENT: Head is normocephalic and atraumatic. EOMI are intact. Mouth is well hydrated and without lesions. NECK: Supple. No masses LUNGS: Clear to auscultation. No presence of rhonchi/wheezing/rales. Adequate chest expansion HEART: RRR, normal s1 and s2. ABDOMEN: Soft, nontender, no guarding, no peritoneal signs, and nondistended. BS +. No masses. EXTREMITIES: Without any cyanosis, clubbing, rash, lesions or edema. NEUROLOGIC: AOx3, no focal motor deficit. SKIN: no jaundice, no rashes  Assessment/Plan 67 year old female with past medical history of depression, anxiety, COPD on oxygen 4 L, neuromuscular disorder, IBS, hyperlipidemia, paroxysmal SVT and history of colon stricture status post resection complicated by C. difficile, who comes for evaluation of chronic diarrhea.  Will proceed with flexible sigmoidoscopy.  Harvel Quale, MD 06/04/2022, 9:40 AM

## 2022-06-04 NOTE — Op Note (Signed)
[facility] Patient Name: [name] Procedure Date: 06/04/2022 9:29 AM MRN: [id number] Date of Birth: 11-20-1955 Attending MD: Katrinka Blazing , , [id number] CSN: [id number] Age: 67 Admit Type: Outpatient Procedure:                Flexible Sigmoidoscopy Indications:              Hematochezia, Increased bowel movement frequency Providers:                Katrinka Blazing, Angelica Ran, Pandora Leiter,                            Technician Referring MD:              Medicines:                Monitored Anesthesia Care Complications:            No immediate complications. Estimated Blood Loss:     Estimated blood loss: none. Procedure:                Pre-Anesthesia Assessment:                           - Prior to the procedure, a History and Physical                            was performed, and patient medications, allergies                            and sensitivities were reviewed. The patient's                            tolerance of previous anesthesia was reviewed.                           - The risks and benefits of the procedure and the                            sedation options and risks were discussed with the                            patient. All questions were answered and informed                            consent was obtained.                           - ASA Grade Assessment: II - A patient with mild                            systemic disease.                           After obtaining informed consent, the scope was                            passed under direct vision. The PCF-HQ190L                            (  [id number]) scope was introduced through the anus and                            advanced to the the descending colon. The flexible                            sigmoidoscopy was accomplished without difficulty.                            The patient tolerated the procedure well. The                            quality of the bowel preparation was fair. Scope  In: 9:47:13 AM Scope Out: 9:59:37 AM Total Procedure Duration: 0 hours 12 minutes 24 seconds  Findings:      The perianal and digital rectal examinations were normal.      A large amount of solid stool was found in the sigmoid colon and in the       descending colon.      A single small-mouthed diverticulum was found in the sigmoid colon.      There was evidence of a prior end-to-side colo-colonic anastomosis at 12       cm proximal to the anus. This was patent and was characterized by       ulceration without active bleeding. The anastomosis was narrow and it       was difficult to pass the pediatric colonoscopy. Due to this, I decided       to switch to an upper adult endoscope. The anastomosis was traversed       easily without resistance.      Normal retroflexion.      Note : bleeding possibly coming from ulceration at anastomosis. Impression:               - Preparation of the colon was fair.                           - Stool in the sigmoid colon and in the descending                            colon.                           - Diverticulosis in the sigmoid colon.                           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 Traversed with upper                            adult endoscope.                           - No specimens collected. Moderate Sedation:      Per Anesthesia Care Recommendation:           - Discharge patient to  home (ambulatory).                           - Resume previous diet.                           - Patient cleared from gastrointestinal perspective                            to undergo orthopedic surgery.                           - No ibuprofen, naproxen, or other non-steroidal                            anti-inflammatory drugs. Procedure Code(s):        --- Professional ---                           [phone], Sigmoidoscopy, flexible; diagnostic,                            including  collection of specimen(s) by brushing or                            washing, when performed (separate procedure) Diagnosis Code(s):        --- Professional ---                           Z98.0, Intestinal bypass and anastomosis status                           K92.1, Melena (includes Hematochezia) CPT copyright 2022 American Medical Association. All rights reserved. The codes documented in this report are preliminary and upon coder review may  be revised to meet current compliance requirements. Katrinka Blazing, MD Katrinka Blazing,  06/04/2022 10:22:39 AM This report has been signed electronically. Number of Addenda: 0

## 2022-06-04 NOTE — Anesthesia Postprocedure Evaluation (Addendum)
Anesthesia Post Note  Patient: [name]  Procedure(s) Performed: [place]  Patient location during evaluation: Phase II Anesthesia Type: General Level of consciousness: awake and alert and oriented Pain management: pain level controlled Vital Signs Assessment: post-procedure vital signs reviewed and stable Respiratory status: spontaneous breathing, nonlabored ventilation, respiratory function stable and patient connected to nasal cannula oxygen Cardiovascular status: blood pressure returned to baseline and stable Postop Assessment: no apparent nausea or vomiting Anesthetic complications: no  No notable events documented.   Last Vitals:  Vitals:   06/04/22 1004 06/04/22 1007  BP: (!) 93/47 105/60  Pulse: 83   Resp: 19   Temp: 36.4 C   SpO2: 97%     Last Pain:  Vitals:   06/04/22 1004  TempSrc: Oral  PainSc: 4                  Sarrah Fiorenza C Zykeem Bauserman

## 2022-06-04 NOTE — Anesthesia Preprocedure Evaluation (Addendum)
Anesthesia Evaluation  Patient identified by MRN, date of birth, ID band Patient awake    Reviewed: Allergy & Precautions, H&P , NPO status , Patient's Chart, lab work & pertinent test results  Airway Mallampati: I  TM Distance: >3 FB Neck ROM: Full    Dental  (+) Dental Advisory Given No notable dental injury :   Pulmonary shortness of breath, sleep apnea and Oxygen sleep apnea , pneumonia, COPD,  oxygen dependent, former smoker   Pulmonary exam normal breath sounds clear to auscultation       Cardiovascular + dysrhythmias Supra Ventricular Tachycardia  Rhythm:Regular Rate:Normal     Neuro/Psych  PSYCHIATRIC DISORDERS Anxiety Depression     Neuromuscular disease    GI/Hepatic Neg liver ROS,GERD  Medicated,,  Endo/Other  negative endocrine ROS    Renal/GU negative Renal ROS  negative genitourinary   Musculoskeletal  (+) Arthritis , Osteoarthritis,    Abdominal   Peds negative pediatric ROS (+)  Hematology  (+) Blood dyscrasia, anemia   Anesthesia Other Findings   Reproductive/Obstetrics negative OB ROS                             Anesthesia Physical Anesthesia Plan  ASA: 3  Anesthesia Plan: General   Post-op Pain Management: Minimal or no pain anticipated   Induction: Intravenous  PONV Risk Score and Plan: 1 and Propofol infusion  Airway Management Planned: Nasal Cannula and Natural Airway  Additional Equipment:   Intra-op Plan:   Post-operative Plan:   Informed Consent: I have reviewed the patients History and Physical, chart, labs and discussed the procedure including the risks, benefits and alternatives for the proposed anesthesia with the patient or authorized representative who has indicated his/her understanding and acceptance.     Dental advisory given  Plan Discussed with: CRNA and Surgeon  Anesthesia Plan Comments:        Anesthesia Quick Evaluation

## 2022-06-04 NOTE — Anesthesia Procedure Notes (Signed)
Date/Time: 06/04/2022 9:41 AM  Performed by: Vista Deck, CRNAPre-anesthesia Checklist: Patient identified, Emergency Drugs available, Suction available, Timeout performed and Patient being monitored Patient Re-evaluated:Patient Re-evaluated prior to induction Oxygen Delivery Method: Nasal Cannula

## 2022-06-05 ENCOUNTER — Telehealth: Payer: Self-pay | Admitting: Gastroenterology

## 2022-06-05 NOTE — Telephone Encounter (Signed)
[name], can you please ask Dr. Jenetta Downer to address this last message?  I'm not sure if she needs to follow up with her surgeon due to anastomotic stricture/ulceration. Wasn't sure of the plan and patient has questions.

## 2022-06-05 NOTE — Telephone Encounter (Signed)
error 

## 2022-06-10 ENCOUNTER — Encounter (HOSPITAL_COMMUNITY): Payer: Self-pay | Admitting: Gastroenterology

## 2022-06-25 DIAGNOSIS — M25552 Pain in left hip: Secondary | ICD-10-CM | POA: Diagnosis not present

## 2022-06-25 NOTE — Progress Notes (Signed)
Surgery orders requested via Epic inbox. °

## 2022-06-28 NOTE — Progress Notes (Signed)
Second request for pre op orders in [facility]: Spoke with [employer]

## 2022-07-01 ENCOUNTER — Other Ambulatory Visit: Payer: Self-pay | Admitting: Cardiology

## 2022-07-01 ENCOUNTER — Ambulatory Visit: Payer: Self-pay | Admitting: Physician Assistant

## 2022-07-01 DIAGNOSIS — G8929 Other chronic pain: Secondary | ICD-10-CM

## 2022-07-01 NOTE — Progress Notes (Signed)
Please place orders for PAT appointment scheduled 07/02/22.

## 2022-07-02 ENCOUNTER — Encounter (HOSPITAL_COMMUNITY): Payer: Self-pay

## 2022-07-02 ENCOUNTER — Encounter (HOSPITAL_COMMUNITY)
Admission: RE | Admit: 2022-07-02 | Discharge: 2022-07-02 | Disposition: A | Discharge status: Home / Self Care | Payer: Medicare Other | Source: Ambulatory Visit | Attending: Orthopedic Surgery | Admitting: Orthopedic Surgery

## 2022-07-02 VITALS — BP 125/78 | HR 67 | Temp 97.9°F | Resp 14 | Ht 63.0 in | Wt 125.2 lb

## 2022-07-02 DIAGNOSIS — R7303 Prediabetes: Secondary | ICD-10-CM | POA: Diagnosis not present

## 2022-07-02 DIAGNOSIS — Z9981 Dependence on supplemental oxygen: Secondary | ICD-10-CM | POA: Insufficient documentation

## 2022-07-02 DIAGNOSIS — Z01812 Encounter for preprocedural laboratory examination: Secondary | ICD-10-CM | POA: Diagnosis not present

## 2022-07-02 DIAGNOSIS — M25552 Pain in left hip: Secondary | ICD-10-CM | POA: Diagnosis not present

## 2022-07-02 DIAGNOSIS — G4730 Sleep apnea, unspecified: Secondary | ICD-10-CM | POA: Diagnosis not present

## 2022-07-02 DIAGNOSIS — Z01818 Encounter for other preprocedural examination: Secondary | ICD-10-CM

## 2022-07-02 DIAGNOSIS — G8929 Other chronic pain: Secondary | ICD-10-CM | POA: Insufficient documentation

## 2022-07-02 DIAGNOSIS — K589 Irritable bowel syndrome without diarrhea: Secondary | ICD-10-CM | POA: Diagnosis not present

## 2022-07-02 DIAGNOSIS — J449 Chronic obstructive pulmonary disease, unspecified: Secondary | ICD-10-CM | POA: Insufficient documentation

## 2022-07-02 DIAGNOSIS — Z79899 Other long term (current) drug therapy: Secondary | ICD-10-CM | POA: Insufficient documentation

## 2022-07-02 DIAGNOSIS — E785 Hyperlipidemia, unspecified: Secondary | ICD-10-CM | POA: Insufficient documentation

## 2022-07-02 DIAGNOSIS — I2510 Atherosclerotic heart disease of native coronary artery without angina pectoris: Secondary | ICD-10-CM | POA: Diagnosis not present

## 2022-07-02 DIAGNOSIS — F419 Anxiety disorder, unspecified: Secondary | ICD-10-CM | POA: Insufficient documentation

## 2022-07-02 DIAGNOSIS — S72092A Other fracture of head and neck of left femur, initial encounter for closed fracture: Secondary | ICD-10-CM | POA: Diagnosis not present

## 2022-07-02 DIAGNOSIS — K219 Gastro-esophageal reflux disease without esophagitis: Secondary | ICD-10-CM | POA: Insufficient documentation

## 2022-07-02 HISTORY — DX: Gastro-esophageal reflux disease without esophagitis: K21.9

## 2022-07-02 HISTORY — DX: Anemia, unspecified: D64.9

## 2022-07-02 LAB — CBC WITH DIFFERENTIAL/PLATELET
Abs Immature Granulocytes: 0.05 10*3/uL (ref 0.00–0.07)
Basophils Absolute: 0 10*3/uL (ref 0.0–0.1)
Basophils Relative: 0 %
Eosinophils Absolute: 0.1 10*3/uL (ref 0.0–0.5)
Eosinophils Relative: 1 %
HCT: 37.2 % (ref 36.0–46.0)
Hemoglobin: 11.7 g/dL — ABNORMAL LOW (ref 12.0–15.0)
Immature Granulocytes: 1 %
Lymphocytes Relative: 23 %
Lymphs Abs: 2.4 10*3/uL (ref 0.7–4.0)
MCH: 29.3 pg (ref 26.0–34.0)
MCHC: 31.5 g/dL (ref 30.0–36.0)
MCV: 93.2 fL (ref 80.0–100.0)
Monocytes Absolute: 0.8 10*3/uL (ref 0.1–1.0)
Monocytes Relative: 7 %
Neutro Abs: 7.2 10*3/uL (ref 1.7–7.7)
Neutrophils Relative %: 68 %
Platelets: 311 10*3/uL (ref 150–400)
RBC: 3.99 MIL/uL (ref 3.87–5.11)
RDW: 14.5 % (ref 11.5–15.5)
WBC: 10.6 10*3/uL — ABNORMAL HIGH (ref 4.0–10.5)
nRBC: 0 % (ref 0.0–0.2)

## 2022-07-02 LAB — GLUCOSE, CAPILLARY: Glucose-Capillary: 152 mg/dL — ABNORMAL HIGH (ref 70–99)

## 2022-07-02 LAB — TYPE AND SCREEN
ABO/RH(D): O POS
Antibody Screen: NEGATIVE

## 2022-07-02 LAB — SURGICAL PCR SCREEN
MRSA, PCR: NEGATIVE
Staphylococcus aureus: NEGATIVE

## 2022-07-02 LAB — COMPREHENSIVE METABOLIC PANEL
ALT: 19 U/L (ref 0–44)
AST: 19 U/L (ref 15–41)
Albumin: 4 g/dL (ref 3.5–5.0)
Alkaline Phosphatase: 98 U/L (ref 38–126)
Anion gap: 7 (ref 5–15)
BUN: 13 mg/dL (ref 8–23)
CO2: 33 mmol/L — ABNORMAL HIGH (ref 22–32)
Calcium: 8.8 mg/dL — ABNORMAL LOW (ref 8.9–10.3)
Chloride: 98 mmol/L (ref 98–111)
Creatinine, Ser: 0.75 mg/dL (ref 0.44–1.00)
GFR, Estimated: >60 mL/min (ref >60)
Glucose, Bld: 120 mg/dL — ABNORMAL HIGH (ref 70–99)
Potassium: 3.8 mmol/L (ref 3.5–5.1)
Sodium: 138 mmol/L (ref 135–145)
Total Bilirubin: 0.6 mg/dL (ref 0.3–1.2)
Total Protein: 7 g/dL (ref 6.5–8.1)

## 2022-07-02 NOTE — Patient Instructions (Addendum)
SURGICAL WAITING ROOM VISITATION  Patients having surgery or a procedure may have no more than 2 support people in the waiting area - these visitors may rotate.    Children under the age of 59 must have an adult with them who is not the patient.  Due to an increase in RSV and influenza rates and associated hospitalizations, children ages 15 and under may not visit patients in [place].  If the patient needs to stay at the hospital during part of their recovery, the visitor guidelines for inpatient rooms apply. Pre-op nurse will coordinate an appropriate time for 1 support person to accompany patient in pre-op.  This support person may not rotate.    Please refer to the [facility] website for the visitor guidelines for Inpatients (after your surgery is over and you are in a regular room).     Your procedure is scheduled on: 07/15/22   Report to [facility] Main Entrance    Report to admitting at 1:00 PM   Call this number if you have problems the morning of surgery [phone]   Do not eat food :After Midnight.   After Midnight you may have the following liquids until 12:30 PM DAY OF SURGERY  Water Non-Citrus Juices (without pulp, NO RED-Apple, White grape, White cranberry) Black Coffee (NO MILK/CREAM OR CREAMERS, sugar ok)  Clear Tea (NO MILK/CREAM OR CREAMERS, sugar ok) regular and decaf                             Plain Jell-O (NO RED)                                           Fruit ices (not with fruit pulp, NO RED)                                     Popsicles (NO RED)                                                               Sports drinks like Gatorade (NO RED)                  The day of surgery:  Drink ONE (1) Pre-Surgery G2 at 12:30 PM the morning of surgery. Drink in one sitting. Do not sip.  This drink was given to you during your hospital  pre-op appointment visit. Nothing else to drink after completing the  Pre-Surgery G2.          If  you have questions, please contact your surgeon's office.   FOLLOW BOWEL PREP AND ANY ADDITIONAL PRE OP INSTRUCTIONS YOU RECEIVED FROM YOUR SURGEON'S OFFICE!!!     Oral Hygiene is also important to reduce your risk of infection.                                    Remember - BRUSH YOUR TEETH THE MORNING OF SURGERY WITH YOUR REGULAR TOOTHPASTE  DENTURES WILL  BE REMOVED PRIOR TO SURGERY PLEASE DO NOT APPLY "Poly grip" OR ADHESIVES!!!   Take these medicines the morning of surgery with A SIP OF WATER:   Inhalers  Alprazolam  Diltiazem  Escitalopram  Oxybutynin  Promethazine  Tramadol  Vancomycin  Zyrtec  DO NOT TAKE ANY ORAL DIABETIC MEDICATIONS DAY OF YOUR SURGERY  How to Manage Your Diabetes Before and After Surgery  Why is it important to control my blood sugar before and after surgery? Improving blood sugar levels before and after surgery helps healing and can limit problems. A way of improving blood sugar control is eating a healthy diet by:  Eating less sugar and carbohydrates  Increasing activity/exercise  Talking with your doctor about reaching your blood sugar goals High blood sugars (greater than 180 mg/dL) can raise your risk of infections and slow your recovery, so you will need to focus on controlling your diabetes during the weeks before surgery. Make sure that the doctor who takes care of your diabetes knows about your planned surgery including the date and location.  How do I manage my blood sugar before surgery? Check your blood sugar at least 4 times a day, starting 2 days before surgery, to make sure that the level is not too high or low. Check your blood sugar the morning of your surgery when you wake up and every 2 hours until you get to the Short Stay unit. If your blood sugar is less than 70 mg/dL, you will need to treat for low blood sugar: Do not take insulin. Treat a low blood sugar (less than 70 mg/dL) with  cup of clear juice (cranberry or apple), 4  glucose tablets, OR glucose gel. Recheck blood sugar in 15 minutes after treatment (to make sure it is greater than 70 mg/dL). If your blood sugar is not greater than 70 mg/dL on recheck, call [phone] for further instructions. Report your blood sugar to the short stay nurse when you get to Short Stay.  If you are admitted to the hospital after surgery: Your blood sugar will be checked by the staff and you will probably be given insulin after surgery (instead of oral diabetes medicines) to make sure you have good blood sugar levels. The goal for blood sugar control after surgery is 80-180 mg/dL.  Reviewed and Endorsed by [facility] Patient Education Committee, August 2015  Bring CPAP mask and tubing day of surgery.                              You may not have any metal on your body including hair pins, jewelry, and body piercing             Do not wear make-up, lotions, powders, perfumes, or deodorant  Do not wear nail polish including gel and S&S, artificial/acrylic nails, or any other type of covering on natural nails including finger and toenails. If you have artificial nails, gel coating, etc. that needs to be removed by a nail salon please have this removed prior to surgery or surgery may need to be canceled/ delayed if the surgeon/ anesthesia feels like they are unable to be safely monitored.   Do not shave  48 hours prior to surgery.    Do not bring valuables to the hospital. [place].   Contacts, glasses, dentures or bridgework may  not be worn into surgery.   Bring small overnight bag day of surgery.   DO NOT [place]. PHARMACY WILL DISPENSE MEDICATIONS LISTED ON YOUR MEDICATION LIST TO YOU DURING YOUR ADMISSION [place]!              Please read over the following fact sheets you were given: IF [place] [phone] Schneiders   If you received a COVID test during your pre-op visit  it is requested that you wear a mask when out in public, stay away from anyone that may not be feeling well and notify your surgeon if you develop symptoms. If you test positive for Covid or have been in contact with anyone that has tested positive in the last 10 days please notify you surgeon.    [facility] - Preparing for Surgery Before surgery, you can play an important role.  Because skin is not sterile, your skin needs to be as free of germs as possible.  You can reduce the number of germs on your skin by washing with CHG (chlorahexidine gluconate) soap before surgery.  CHG is an antiseptic cleaner which kills germs and bonds with the skin to continue killing germs even after washing. Please DO NOT use if you have an allergy to CHG or antibacterial soaps.  If your skin becomes reddened/irritated stop using the CHG and inform your nurse when you arrive at Short Stay. Do not shave (including legs and underarms) for at least 48 hours prior to the first CHG shower.  You may shave your face/neck.  Please follow these instructions carefully:  1.  Shower with CHG Soap the night before surgery and the  morning of surgery.  2.  If you choose to wash your hair, wash your hair first as usual with your normal  shampoo.  3.  After you shampoo, rinse your hair and body thoroughly to remove the shampoo.                             4.  Use CHG as you would any other liquid soap.  You can apply chg directly to the skin and wash.  Gently with a scrungie or clean washcloth.  5.  Apply the CHG Soap to your body ONLY FROM THE NECK DOWN.   Do   not use on face/ open                           Wound or open sores. Avoid contact with eyes, ears mouth and   genitals (private parts).                       Wash face,  Genitals (private parts) with your normal soap.             6.  Wash thoroughly, paying special attention to the area where your    surgery   will be performed.  7.  Thoroughly rinse your body with warm water from the neck down.  8.  DO NOT shower/wash with your normal soap after using and rinsing off the CHG Soap.                9.  Pat yourself dry with a clean towel.            10.  Wear  clean pajamas.            11.  Place clean sheets on your bed the night of your first shower and do not  sleep with pets. Day of Surgery : Do not apply any lotions/deodorants the morning of surgery.  Please wear clean clothes to the hospital/surgery center.  FAILURE TO FOLLOW THESE INSTRUCTIONS MAY RESULT IN THE CANCELLATION OF YOUR SURGERY  PATIENT SIGNATURE_________________________________  NURSE SIGNATURE__________________________________  ________________________________________________________________________  Adam Phenix  An incentive spirometer is a tool that can help keep your lungs clear and active. This tool measures how well you are filling your lungs with each breath. Taking long deep breaths may help reverse or decrease the chance of developing breathing (pulmonary) problems (especially infection) following: A long period of time when you are unable to move or be active. BEFORE THE PROCEDURE  If the spirometer includes an indicator to show your best effort, your nurse or respiratory therapist will set it to a desired goal. If possible, sit up straight or lean slightly forward. Try not to slouch. Hold the incentive spirometer in an upright position. INSTRUCTIONS FOR USE  Sit on the edge of your bed if possible, or sit up as far as you can in bed or on a chair. Hold the incentive spirometer in an upright position. Breathe out normally. Place the mouthpiece in your mouth and seal your lips tightly around it. Breathe in slowly and as deeply as possible, raising the piston or the ball toward the top of the column. Hold your breath for 3-5 seconds or for as long as possible. Allow the piston or ball to fall to the bottom  of the column. Remove the mouthpiece from your mouth and breathe out normally. Rest for a few seconds and repeat Steps 1 through 7 at least 10 times every 1-2 hours when you are awake. Take your time and take a few normal breaths between deep breaths. The spirometer may include an indicator to show your best effort. Use the indicator as a goal to work toward during each repetition. After each set of 10 deep breaths, practice coughing to be sure your lungs are clear. If you have an incision (the cut made at the time of surgery), support your incision when coughing by placing a pillow or rolled up towels firmly against it. Once you are able to get out of bed, walk around indoors and cough well. You may stop using the incentive spirometer when instructed by your caregiver.  RISKS AND COMPLICATIONS Take your time so you do not get dizzy or light-headed. If you are in pain, you may need to take or ask for pain medication before doing incentive spirometry. It is harder to take a deep breath if you are having pain. AFTER USE Rest and breathe slowly and easily. It can be helpful to keep track of a log of your progress. Your caregiver can provide you with a simple table to help with this. If you are using the spirometer at home, follow these instructions: [place] IF:  You are having difficultly using the spirometer. You have trouble using the spirometer as often as instructed. Your pain medication is not giving enough relief while using the spirometer. You develop fever of 100.5 F (38.1 C) or higher. SEEK IMMEDIATE MEDICAL CARE IF:  You cough up bloody sputum that had not been present before. You develop fever of 102 F (38.9 C) or greater. You develop worsening pain at or near the incision site.  MAKE SURE YOU:  Understand these instructions. Will watch your condition. Will get help right away if you are not doing well or get worse. Document Released: 09/02/2006 Document Revised:  07/15/2011 Document Reviewed: 11/03/2006 Exit[employer] Patient Information 2014 ExitCare, [place].   ________________________________________________________________________ WHAT IS A BLOOD TRANSFUSION? Blood Transfusion Information  A transfusion is the replacement of blood or some of its parts. Blood is made up of multiple cells which provide different functions. Red blood cells carry oxygen and are used for blood loss replacement. White blood cells fight against infection. Platelets control bleeding. Plasma helps clot blood. Other blood products are available for specialized needs, such as hemophilia or other clotting disorders. BEFORE THE TRANSFUSION  Who gives blood for transfusions?  Healthy volunteers who are fully evaluated to make sure their blood is safe. This is blood bank blood. Transfusion therapy is the safest it has ever been in the practice of medicine. Before blood is taken from a donor, a complete history is taken to make sure that person has no history of diseases nor engages in risky social behavior (examples are intravenous drug use or sexual activity with multiple partners). The donor's travel history is screened to minimize risk of transmitting infections, such as malaria. The donated blood is tested for signs of infectious diseases, such as HIV and hepatitis. The blood is then tested to be sure it is compatible with you in order to minimize the chance of a transfusion reaction. If you or a relative donates blood, this is often done in anticipation of surgery and is not appropriate for emergency situations. It takes many days to process the donated blood. RISKS AND COMPLICATIONS Although transfusion therapy is very safe and saves many lives, the main dangers of transfusion include:  Getting an infectious disease. Developing a transfusion reaction. This is an allergic reaction to something in the blood you were given. Every precaution is taken to prevent this. The decision to have  a blood transfusion has been considered carefully by your caregiver before blood is given. Blood is not given unless the benefits outweigh the risks. AFTER THE TRANSFUSION Right after receiving a blood transfusion, you will usually feel much better and more energetic. This is especially true if your red blood cells have gotten low (anemic). The transfusion raises the level of the red blood cells which carry oxygen, and this usually causes an energy increase. The nurse administering the transfusion will monitor you carefully for complications. HOME CARE INSTRUCTIONS  No special instructions are needed after a transfusion. You may find your energy is better. Speak with your caregiver about any limitations on activity for underlying diseases you may have. SEEK MEDICAL CARE IF:  Your condition is not improving after your transfusion. You develop redness or irritation at the intravenous (IV) site. SEEK IMMEDIATE MEDICAL CARE IF:  Any of the following symptoms occur over the next 12 hours: Shaking chills. You have a temperature by mouth above 102 F (38.9 C), not controlled by medicine. Chest, back, or muscle pain. People around you feel you are not acting correctly or are confused. Shortness of breath or difficulty breathing. Dizziness and fainting. You get a rash or develop hives. You have a decrease in urine output. Your urine turns a dark color or changes to pink, red, or brown. Any of the following symptoms occur over the next 10 days: You have a temperature by mouth above 102 F (38.9 C), not controlled by medicine. Shortness of breath. Weakness after normal activity. The white part  of the eye turns yellow (jaundice). You have a decrease in the amount of urine or are urinating less often. Your urine turns a dark color or changes to pink, red, or brown. Document Released: 04/19/2000 Document Revised: 07/15/2011 Document Reviewed: 12/07/2007 [facility] Patient Information 2014 [place],  [place].  _______________________________________________________________________

## 2022-07-02 NOTE — Progress Notes (Addendum)
COVID Vaccine Completed: yes  Date of COVID positive in last 90 days: no  PCP - Asencion Noble, MD Cardiologist - Rozann Lesches, MD Pulmonologist- Dr. Keturah Shavers  Cardiac clearance by Christen Bame 05/16/22 in Epic  Chest x-ray -  EKG - 11/12/21 Epic Stress Test - 11/15/21 Epic ECHO - 07/03/20 Epic Cardiac Cath - 2005 Pacemaker/ICD device last checked: n/a Spinal Cord Stimulator: n/a  Bowel Prep - no  Sleep Study -  CPAP - BiPAP  Fasting Blood Sugar - preDM Checks Blood Sugar _____ times a day  Last dose of GLP1 agonist-  N/A GLP1 instructions:  N/A   Last dose of SGLT-2 inhibitors-  N/A SGLT-2 instructions: N/A   Blood Thinner Instructions: Aspirin Instructions:ASA 81, hold 7 days per cards Last Dose:  Activity level: Can perform activities of daily living without stopping and without symptoms of chest pain. SOB all the time has hx of COPD. No stairs due to knee pain   Anesthesia review: COPD, continuous O2, SVT, preDM, mild CAD, C diff + 05/09/22  Patient denies fever, cough and chest pain at PAT appointment  Patient verbalized understanding of instructions that were given to them at the PAT appointment. Patient was also instructed that they will need to review over the PAT instructions again at home before surgery.

## 2022-07-03 ENCOUNTER — Ambulatory Visit: Payer: Self-pay | Admitting: Physician Assistant

## 2022-07-03 LAB — HEMOGLOBIN A1C
Hgb A1c MFr Bld: 5.9 % — ABNORMAL HIGH (ref 4.8–5.6)
Mean Plasma Glucose: 123 mg/dL

## 2022-07-03 NOTE — H&P (Signed)
TOTAL HIP ADMISSION H&P  Patient is admitted for left total hip arthroplasty.  Subjective:  Chief Complaint: left hip pain  HPI: [name], 67 y.o. female, has a history of pain and functional disability in the left hip(s) due to arthritis and patient has failed non-surgical conservative treatments for greater than 12 weeks to include NSAID's and/or analgesics, use of assistive devices, and activity modification.  Onset of symptoms was gradual starting 4 years ago with gradually worsening course since that time.The patient noted no past surgery on the left hip(s).  Patient currently rates pain in the left hip at 9 out of 10 with activity. Patient has night pain, worsening of pain with activity and weight bearing, trendelenberg gait, pain that interfers with activities of daily living, and pain with passive range of motion. Patient has evidence of periarticular osteophytes, joint space narrowing, and signs of AVN without collapse  by imaging studies. This condition presents safety issues increasing the risk of falls. This patient has had avascular necrosis of the hip, acetabular fracture, hip dysplasia.  There is no current active infection.  Patient Active Problem List   Diagnosis Date Noted  . C. difficile colitis 04/08/2022  . Abdominal pain 03/24/2022  . Rectal bleeding 03/24/2022  . External hemorrhoids with complication [id number]  . 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 Supplemental oxygen dependent 10/23/2021  . Diverticulitis of large intestine without perforation or abscess without bleeding 10/23/2021  . Gastroesophageal reflux disease 08/07/2021  . Solitary pulmonary nodule 09/08/2019  . Muscle weakness (generalized)   . Chronic respiratory failure with hypoxia and hypercapnia ([place]) 01/21/2015  . Macrocytic anemia 01/21/2015  . Respiratory failure with hypercapnia ([place]) 11/14/2014  . Anemia 11/14/2014  . Elevated glucose 05/24/2012  . COPD Group D symptoms  12/23/2011  . Hyperlipidemia 12/23/2011   Past Medical History:  Diagnosis Date  . Allergy   . Anemia   . Anxiety   . Arthritis   . COPD (chronic obstructive pulmonary disease) (H[facility])    Home oxygen at [facility]  . Depression   . Diverticulosis   . Dyspnea   . Dysrhythmia   . GERD (gastroesophageal reflux disease)   . History of cardiac catheterization    No significant CAD 2005  . Hyperlipidemia   . Hypoglycemia   . IBS (irritable bowel syndrome)   . Neuromuscular disorder ([place])   . Osteoporosis   . Paroxysmal SVT (supraventricular tachycardia)   . Pneumonia 2015  . Pre-diabetes   . Shingles 2012  . Sleep apnea     Past Surgical History:  Procedure Laterality Date  . BACK SURGERY    . BIOPSY  09/12/2021   Procedure: BIOPSY;  Surgeon: Rogene Houston, MD;  [facility];  Service: Endoscopy;;  . BREAST SURGERY    . CARDIAC CATHETERIZATION  2004  . COLONOSCOPY  03/22/2011   Procedure: COLONOSCOPY;  Surgeon: Rogene Houston, MD;  [facility]: [facility];  Service: Endoscopy;  Laterality: N/A;  . COLONOSCOPY WITH PROPOFOL N/A 05/16/2017   Procedure: COLONOSCOPY WITH PROPOFOL;  Surgeon: Rogene Houston, MD;  [facility];  Service: Endoscopy;  Laterality: N/A;  . COLONOSCOPY WITH PROPOFOL N/A 09/12/2021   Procedure: COLONOSCOPY WITH PROPOFOL;  Surgeon: Rogene Houston, MD;  [facility];  Service: Endoscopy;  Laterality: N/A;  1230 ASA 2  . ESOPHAGEAL DILATION N/A 04/19/2020   Procedure: ESOPHAGEAL DILATION;  Surgeon: Rogene Houston, MD;  [facility];  Service:  Endoscopy;  Laterality: N/A;  . ESOPHAGOGASTRODUODENOSCOPY (EGD) WITH PROPOFOL N/A 04/19/2020   Procedure: ESOPHAGOGASTRODUODENOSCOPY (EGD) WITH PROPOFOL;  Surgeon: Rogene Houston, MD;  [facility];  Service: Endoscopy;  Laterality: N/A;  9:15  . ESOPHAGOGASTRODUODENOSCOPY (EGD) WITH PROPOFOL N/A 09/12/2021   Procedure: ESOPHAGOGASTRODUODENOSCOPY (EGD) WITH  PROPOFOL;  Surgeon: Rogene Houston, MD;  [facility];  Service: Endoscopy;  Laterality: N/A;  . FLEXIBLE SIGMOIDOSCOPY N/A 09/26/2017   Procedure: FLEXIBLE SIGMOIDOSCOPY with Propofol ;  Surgeon: Rogene Houston, MD;  [facility];  Service: Endoscopy;  Laterality: N/A;  9:30  . FLEXIBLE SIGMOIDOSCOPY N/A 06/04/2022   Procedure: FLEXIBLE SIGMOIDOSCOPY;  Surgeon: Harvel Quale, MD;  [facility];  Service: Gastroenterology;  Laterality: N/A;  10:30am, asa 3  . OSTOMY N/A 02/07/2022   Procedure: POSSIBLE OSTOMY;  Surgeon: Michael Boston, MD;  Location: [facility];  Service: General;  Laterality: N/A;  . POLYPECTOMY  05/16/2017   Procedure: POLYPECTOMY;  Surgeon: Rogene Houston, MD;  [facility];  Service: Endoscopy;;  . POLYPECTOMY  09/12/2021   Procedure: POLYPECTOMY INTESTINAL;  Surgeon: Rogene Houston, MD;  [facility];  Service: Endoscopy;;  . PROCTOSCOPY N/A 02/07/2022   Procedure: RIGID PROCTOSCOPY;  Surgeon: Michael Boston, MD;  Location: [facility];  Service: General;  Laterality: N/A;  . SPINAL FUSION    . VULVECTOMY      Current Outpatient Medications  Medication Sig Dispense Refill Last Dose  . albuterol (VENTOLIN HFA) 108 (90 Base) MCG/ACT inhaler Inhale 1-2 puffs into the lungs every 6 (six) hours as needed for wheezing or shortness of breath.     . ALPRAZolam (XANAX) 0.25 MG tablet Take 0.25 mg by mouth 3 (three) times daily as needed for anxiety.      Marland Kitchen aspirin 81 MG tablet Take 1 tablet (81 mg total) by mouth daily. 100 tablet 3   . atorvastatin (LIPITOR) 40 MG tablet TAKE 1 TABLET DAILY 90 tablet 3   . azelastine (ASTELIN) 0.1 % nasal spray Place 1 spray into both nostrils daily as needed for rhinitis or allergies. Use in each nostril as directed     . carboxymethylcellulose 1 % ophthalmic solution Place 2 drops into both eyes 3 (three) times daily as needed (for dry eyes).     . cetirizine (ZYRTEC) 10 MG tablet  Take 10 mg by mouth daily.     . clonazePAM (KLONOPIN) 0.5 MG tablet Take 1 tablet (0.5 mg total) by mouth at bedtime. (Patient taking differently: Take 0.5 mg by mouth at bedtime as needed for anxiety.) 30 tablet 5   . diclofenac Sodium (VOLTAREN) 1 % GEL Apply 2 g topically 2 (two) times daily as needed (pain).     Marland Kitchen dicyclomine (BENTYL) 10 MG capsule Take 1 capsule (10 mg total) by mouth 3 (three) times daily as needed for spasms. (Patient not taking: Reported on 07/01/2022) 90 capsule 2   . diltiazem (CARDIZEM) 30 MG tablet Take 1 tablet (30 mg total) by mouth 3 (three) times daily. Take 1-2 tablets every 6 hours as needed for persistent tachycardai (Patient taking differently: Take 30 mg by mouth 3 (three) times daily. Take 1-2 tablets every 6 hours as needed for persistent tachycardia) 90 tablet 3   . diltiazem (DILACOR XR) 240 MG 24 hr capsule Take 240 mg by mouth daily.     Marland Kitchen escitalopram (LEXAPRO) 20 MG tablet Take 20 mg by mouth daily.     Marland Kitchen  Fluticasone-Umeclidin-Vilant (TRELEGY ELLIPTA) 100-62.5-25 MCG/ACT AEPB Inhale 1 puff into the lungs daily.     . hydrOXYzine (ATARAX) 25 MG tablet Take 0.5 tablets (12.5 mg total) by mouth every 6 (six) hours as needed for itching. 30 tablet 0   . ibuprofen (ADVIL) 200 MG tablet Take 400 mg by mouth every 6 (six) hours as needed for moderate pain.     . methocarbamol (ROBAXIN) 500 MG tablet Take 500-1,000 mg by mouth every 6 (six) hours as needed for muscle spasms.     . montelukast (SINGULAIR) 10 MG tablet Take 1 tablet (10 mg total) by mouth at bedtime. 30 tablet 1   . NON FORMULARY Pt uses a bipap with 4L of oxygen when laying down     . oxybutynin (DITROPAN) 5 MG tablet Take 5 mg by mouth daily.      . OXYGEN Inhale 3.5-7 L into the lungs continuous.     . promethazine (PHENERGAN) 12.5 MG tablet Take 12.5 mg by mouth every 6 (six) hours as needed for vomiting or nausea.     . roflumilast (DALIRESP) 500 MCG TABS tablet Take 500 mcg by mouth daily.      . traMADol (ULTRAM) 50 MG tablet Take 1 tablet (50 mg total) by mouth every 6 (six) hours as needed for moderate pain. 30 tablet 0   . traZODone (DESYREL) 50 MG tablet Take 50 mg by mouth at bedtime as needed for sleep.     . valACYclovir (VALTREX) 500 MG tablet Take 500 mg by mouth daily.     . vancomycin (VANCOCIN) 125 MG capsule Take '125mg'$  po QID for 14 days, then '125mg'$  BID for 7 days, then '125mg'$  daily for 7 days, then 125 mg every 2 days for four weeks, then stop. (Patient taking differently: Take 125 mg by mouth every other day.) 93 capsule 0    No current facility-administered medications for this visit.   Allergies  Allergen Reactions  . Iohexol Anaphylaxis and Other (See Comments)     Desc: IV DYE  ANAPHYLATIC SHOCK X2 ONCE AFTER PREMEDS   . Benadryl [Diphenhydramine]     Makes her stay up instead of drowsy  . Ketek [Telithromycin] Other (See Comments)    Loopy, eyes went different directions  . Penicillins Other (See Comments)    Unknown Has patient had a PCN reaction causing immediate rash, facial/tongue/throat swelling, SOB or lightheadedness with hypotension: Unknown Has patient had a PCN reaction causing severe rash involving mucus membranes or skin necrosis: Unknown Has patient had a PCN reaction that required hospitalization: Unknown Has patient had a PCN reaction occurring within the last 10 years: No If all of the above answers are "NO", then may proceed with Cephalosporin use.  . Adhesive [Tape] Itching and Rash    Break out  . Chantix [Varenicline] Palpitations    Social History   Tobacco Use  . Smoking status: Former    Packs/day: 1.00    Years: 40.00    Total pack years: 40.00    Types: Cigarettes    Quit date: 05/07/2015    Years since quitting: 7.1    Passive exposure: Past  . Smokeless tobacco: Never  Substance Use Topics  . Alcohol use: Yes    Comment: 1 -2 glasses of wine daily    Family History  Problem Relation Age of Onset  . Asthma Mother    . Heart attack Mother   . Diabetes Mother   . Heart failure Mother   .  Colon cancer Neg Hx      Review of Systems  Respiratory:  Positive for shortness of breath.   Cardiovascular:  Positive for chest pain, palpitations and leg swelling.  Gastrointestinal:  Positive for blood in stool.  Genitourinary:  Positive for dysuria and frequency.  Musculoskeletal:  Positive for arthralgias.  Skin:  Positive for rash.  Neurological:  Positive for dizziness, syncope and headaches.  Psychiatric/Behavioral:  The patient is nervous/anxious.   All other systems reviewed and are negative.  Objective:  Physical Exam Constitutional:      General: She is not in acute distress.    Appearance: Normal appearance.  HENT:     Head: Normocephalic and atraumatic.  Eyes:     Extraocular Movements: Extraocular movements intact.     Pupils: Pupils are equal, round, and reactive to light.  Cardiovascular:     Rate and Rhythm: Normal rate and regular rhythm.     Pulses: Normal pulses.     Heart sounds: Normal heart sounds.  Pulmonary:     Effort: Pulmonary effort is normal. No respiratory distress.     Breath sounds: Normal breath sounds. No wheezing.  Abdominal:     General: Abdomen is flat. There is no distension.     Palpations: Abdomen is soft.     Tenderness: There is no abdominal tenderness.  Musculoskeletal:     Cervical back: Normal range of motion and neck supple.     Comments: : Examination of the left lower extremity again shows she is neurovascularly intact.  Intact dorsiflexion and plantarflexion with the ankle.  There is no calf tenderness to palpation.  Painful arc of motion and limited range of motion with the left hip.    Lymphadenopathy:     Cervical: No cervical adenopathy.  Skin:    General: Skin is warm and dry.     Findings: No erythema or rash.  Neurological:     General: No focal deficit present.     Mental Status: She is alert and oriented to person, place, and time.   Psychiatric:        Mood and Affect: Mood normal.        Behavior: Behavior normal.   Vital signs in last 24 hours: '@VSRANGES'$ @  Labs:   Estimated body mass index is 22.18 kg/m as calculated from the following:   Height as of 07/02/22: '5\' 3"'$  (1.6 m).   Weight as of 07/02/22: 56.8 kg.   Imaging Review Plain radiographs demonstrate  AVN without collapse  degenerative joint disease of the left hip(s). The bone quality appears to be good for age and reported activity level.      Assessment/Plan:  End stage arthritis, left hip(s)  The patient history, physical examination, clinical judgement of the provider and imaging studies are consistent with end stage degenerative joint disease of the left hip(s) and total hip arthroplasty is deemed medically necessary. The treatment options including medical management, injection therapy, arthroscopy and arthroplasty were discussed at length. The risks and benefits of total hip arthroplasty were presented and reviewed. The risks due to aseptic loosening, infection, stiffness, dislocation/subluxation,  thromboembolic complications and other imponderables were discussed.  The patient acknowledged the explanation, agreed to proceed with the plan and consent was signed. Patient is being admitted for inpatient treatment for surgery, pain control, PT, OT, prophylactic antibiotics, VTE prophylaxis, progressive ambulation and ADL's and discharge planning.The patient is planning to be discharged home with home health services   Anticipated LOS equal to or  greater than 2 midnights due to - Age 77 and older with one or more of the following:  - Obesity  - Expected need for hospital services (PT, OT, Nursing) required for safe  discharge  - Anticipated need for postoperative skilled nursing care or inpatient rehab  - Active co-morbidities: Diabetes, Cardiac Arrhythmia, and Respiratory Failure/COPD OR   - Unanticipated findings during/Post Surgery: None  -  Patient is a high risk of re-admission due to: None

## 2022-07-03 NOTE — Anesthesia Preprocedure Evaluation (Addendum)
Anesthesia Evaluation  Patient identified by MRN, date of birth, ID band Patient awake    Reviewed: Allergy & Precautions, NPO status , Patient's Chart, lab work & pertinent test results  Airway Mallampati: II  TM Distance: >3 FB Neck ROM: Limited    Dental no notable dental hx.    Pulmonary shortness of breath, sleep apnea (BiPAP) , COPD,  COPD inhaler and oxygen dependent, former smoker 4L [place]   Pulmonary exam normal breath sounds clear to auscultation       Cardiovascular negative cardio ROS Normal cardiovascular exam Rhythm:Regular Rate:Normal     Neuro/Psych  PSYCHIATRIC DISORDERS Anxiety Depression       GI/Hepatic negative GI ROS, Neg liver ROS,,,  Endo/Other  negative endocrine ROS    Renal/GU negative Renal ROS     Musculoskeletal  (+) Arthritis ,    Abdominal   Peds  Hematology negative hematology ROS (+)   Anesthesia Other Findings left subchondral femoral neck fracture  Reproductive/Obstetrics                             Anesthesia Physical Anesthesia Plan  ASA: 4  Anesthesia Plan: Spinal   Post-op Pain Management:    Induction: Intravenous  PONV Risk Score and Plan: 2 and Ondansetron, Dexamethasone, Midazolam and Treatment may vary due to age or medical condition  Airway Management Planned: Simple Face Mask  Additional Equipment:   Intra-op Plan:   Post-operative Plan:   Informed Consent: I have reviewed the patients History and Physical, chart, labs and discussed the procedure including the risks, benefits and alternatives for the proposed anesthesia with the patient or authorized representative who has indicated his/her understanding and acceptance.     Dental advisory given  Plan Discussed with: CRNA  Anesthesia Plan Comments: (PAT note 07/02/2022)       Anesthesia Quick Evaluation

## 2022-07-03 NOTE — Progress Notes (Signed)
Anesthesia Chart Review   Case: [id number] Date/Time: 07/15/22 1518   Procedure: TOTAL HIP ARTHROPLASTY (Left: Hip)   Anesthesia type: Spinal   Pre-op diagnosis: left subchondral femoral neck fracture   [facility]: [facility] 08 / [facility]   Surgeons: Willaim Sheng, MD       DISCUSSION:66 y.o. former smoker with h/o COPD on home O2, sleep apnea, nonobstructive CAD on cath 2005, normal myoview 2004, left subchondral femoral neck fracture scheduled for above procedure 07/15/22 with Dr. Charlies Constable.   Per previous anesthesia note 02/07/2022, "Difficulty Due To: Difficulty was anticipated, Difficult Airway- due to anterior larynx and Difficult Airway- due to limited oral opening Future Recommendations: Recommend- induction with short-acting agent, and alternative techniques readily available Comments: Attempted first pass with Miller 2.  Unable to visualize glottic opening, just epiglottis.  Transitioned to Glidescope with easy intubation"  Pt seen by cardiology 05/16/2022 for preoperative evaluation.  Per OV note, " Preoperative Cardiovascular Risk Assessment: The patient is doing well from a cardiac perspective. Therefore, based on ACC/AHA guidelines, the patient would be at acceptable risk for the planned procedure without further cardiovascular testing. According to the Revised Cardiac Risk Index (RCRI), her Perioperative Risk of Major Cardiac Event is (%): 0.9 Her Functional Capacity in METs is: 4.4 according to the Duke Activity Status Index (DASI). The patient was advised that if she develops new symptoms prior to surgery to contact our office to arrange for a follow-up visit, and she verbalized understanding. She may hold aspirin for 5 to 7 days prior to surgery with recommendation to restart as soon as safely possible postoperatively. A copy of this note will be routed to requesting surgeon."  Pt last seen by pulmonology 03/21/2022. Per OV note, "- explained her pulmonary risk for hip  surgery if needed would be no different than for colon surgery, and she did okay with this - if she needed hip surgery there isn't a pulmonary contra-indication for this"  Anticipate pt can proceed with planned procedure barring acute status change.   VS: BP 125/78   Pulse 67   Temp 36.6 C (Oral)   Resp 14   Ht '5\' 3"'$  (1.6 m)   Wt 56.8 kg   SpO2 97%   BMI 22.18 kg/m   PROVIDERS: Asencion Noble, MD is PCP    LABS: Labs reviewed: Acceptable for surgery. (all labs ordered are listed, but only abnormal results are displayed)  Labs Reviewed  CBC WITH DIFFERENTIAL/PLATELET - Abnormal; Notable for the following components:      Result Value   WBC 10.6 (*)    Hemoglobin 11.7 (*)    All other components within normal limits  COMPREHENSIVE METABOLIC PANEL - Abnormal; Notable for the following components:   CO2 33 (*)    Glucose, Bld 120 (*)    Calcium 8.8 (*)    All other components within normal limits  HEMOGLOBIN A1C - Abnormal; Notable for the following components:   Hgb A1c MFr Bld 5.9 (*)    All other components within normal limits  GLUCOSE, CAPILLARY - Abnormal; Notable for the following components:   Glucose-Capillary 152 (*)    All other components within normal limits  SURGICAL PCR SCREEN  TYPE AND SCREEN     IMAGES:   EKG:   CV: Myocardial Perfusion 11/15/2021   Nonspecific T wave abnormality at baseline. The ECG shows premature ventricular contractions.   The patient reported dyspnea and headaches during the stress test. Normal blood pressure and normal  heart rate response noted during stress. Heart rate recovery was normal.   There were depening of the T wave inversions in the inferolateral leads with infusion compared to baseline EKG.   LV perfusion is normal. There is no evidence of ischemia. There is no evidence of infarction.   Left ventricular function is normal. Nuclear stress EF: 78 %. End diastolic cavity size is normal. End systolic cavity size is  normal.   The study is normal. The study is low risk.  Echo 10/31/2020 1. Left ventricular ejection fraction, by estimation, is 55 to 60%. The  left ventricle has normal function. The left ventricle has no regional  wall motion abnormalities. Left ventricular diastolic parameters are  consistent with Grade I diastolic  dysfunction (impaired relaxation).   2. Right ventricular systolic function is normal. The right ventricular  size is normal.   3. The mitral valve is normal in structure. No evidence of mitral valve  regurgitation. No evidence of mitral stenosis.   4. The aortic valve has an indeterminant number of cusps. Aortic valve  regurgitation is not visualized. No aortic stenosis is present.   5. The inferior vena cava is normal in size with greater than 50%  respiratory variability, suggesting right atrial pressure of 3 mmHg.  Past Medical History:  Diagnosis Date   Allergy    Anemia    Anxiety    Arthritis    COPD (chronic obstructive pulmonary disease) (HCC)    Home oxygen at [facility]   Depression    Diverticulosis    Dyspnea    Dysrhythmia    GERD (gastroesophageal reflux disease)    History of cardiac catheterization    No significant CAD 2005   Hyperlipidemia    Hypoglycemia    IBS (irritable bowel syndrome)    Neuromuscular disorder (HCC)    Osteoporosis    Paroxysmal SVT (supraventricular tachycardia)    Pneumonia 2015   Pre-diabetes    Shingles 2012   Sleep apnea     Past Surgical History:  Procedure Laterality Date   BACK SURGERY     BIOPSY  09/12/2021   Procedure: BIOPSY;  Surgeon: Rogene Houston, MD;  [facility];  Service: Endoscopy;;   BREAST SURGERY     CARDIAC CATHETERIZATION  2004   COLONOSCOPY  03/22/2011   Procedure: COLONOSCOPY;  Surgeon: Rogene Houston, MD;  [facility];  Service: Endoscopy;  Laterality: N/A;   COLONOSCOPY WITH PROPOFOL N/A 05/16/2017   Procedure: COLONOSCOPY WITH PROPOFOL;  Surgeon: Rogene Houston, MD;  [facility];  Service: Endoscopy;  Laterality: N/A;   COLONOSCOPY WITH PROPOFOL N/A 09/12/2021   Procedure: COLONOSCOPY WITH PROPOFOL;  Surgeon: Rogene Houston, MD;  [facility];  Service: Endoscopy;  Laterality: N/A;  1230 ASA 2   ESOPHAGEAL DILATION N/A 04/19/2020   Procedure: ESOPHAGEAL DILATION;  Surgeon: Rogene Houston, MD;  [facility];  Service: Endoscopy;  Laterality: N/A;   ESOPHAGOGASTRODUODENOSCOPY (EGD) WITH PROPOFOL N/A 04/19/2020   Procedure: ESOPHAGOGASTRODUODENOSCOPY (EGD) WITH PROPOFOL;  Surgeon: Rogene Houston, MD;  [facility];  Service: Endoscopy;  Laterality: N/A;  9:15   ESOPHAGOGASTRODUODENOSCOPY (EGD) WITH PROPOFOL N/A 09/12/2021   Procedure: ESOPHAGOGASTRODUODENOSCOPY (EGD) WITH PROPOFOL;  Surgeon: Rogene Houston, MD;  [facility];  Service: Endoscopy;  Laterality: N/A;   FLEXIBLE SIGMOIDOSCOPY N/A 09/26/2017   Procedure: FLEXIBLE SIGMOIDOSCOPY with Propofol ;  Surgeon: Rogene Houston, MD;  [facility];  Service: Endoscopy;  Laterality: N/A;  9:30   FLEXIBLE SIGMOIDOSCOPY N/A 06/04/2022   Procedure: FLEXIBLE SIGMOIDOSCOPY;  Surgeon: Harvel Quale, MD;  [facility];  Service: Gastroenterology;  Laterality: N/A;  10:30am, asa 3   OSTOMY N/A 02/07/2022   Procedure: POSSIBLE OSTOMY;  Surgeon: Michael Boston, MD;  [facility];  Service: General;  Laterality: N/A;   POLYPECTOMY  05/16/2017   Procedure: POLYPECTOMY;  Surgeon: Rogene Houston, MD;  [facility];  Service: Endoscopy;;   POLYPECTOMY  09/12/2021   Procedure: POLYPECTOMY INTESTINAL;  Surgeon: Rogene Houston, MD;  [facility];  Service: Endoscopy;;   PROCTOSCOPY N/A 02/07/2022   Procedure: RIGID PROCTOSCOPY;  Surgeon: Michael Boston, MD;  [facility];  Service: General;  Laterality: N/A;   SPINAL FUSION     VULVECTOMY      MEDICATIONS:  albuterol (VENTOLIN HFA) 108 (90  Base) MCG/ACT inhaler   ALPRAZolam (XANAX) 0.25 MG tablet   aspirin 81 MG tablet   atorvastatin (LIPITOR) 40 MG tablet   azelastine (ASTELIN) 0.1 % nasal spray   carboxymethylcellulose 1 % ophthalmic solution   cetirizine (ZYRTEC) 10 MG tablet   clonazePAM (KLONOPIN) 0.5 MG tablet   diclofenac Sodium (VOLTAREN) 1 % GEL   dicyclomine (BENTYL) 10 MG capsule   diltiazem (CARDIZEM) 30 MG tablet   diltiazem (DILACOR XR) 240 MG 24 hr capsule   escitalopram (LEXAPRO) 20 MG tablet   Fluticasone-Umeclidin-Vilant (TRELEGY ELLIPTA) 100-62.5-25 MCG/ACT AEPB   hydrOXYzine (ATARAX) 25 MG tablet   ibuprofen (ADVIL) 200 MG tablet   methocarbamol (ROBAXIN) 500 MG tablet   montelukast (SINGULAIR) 10 MG tablet   NON FORMULARY   oxybutynin (DITROPAN) 5 MG tablet   OXYGEN   promethazine (PHENERGAN) 12.5 MG tablet   roflumilast (DALIRESP) 500 MCG TABS tablet   traMADol (ULTRAM) 50 MG tablet   traZODone (DESYREL) 50 MG tablet   valACYclovir (VALTREX) 500 MG tablet   vancomycin (VANCOCIN) 125 MG capsule   No current facility-administered medications for this encounter.   Konrad Felix Ward, PA-C [facility] [phone]

## 2022-07-03 NOTE — H&P (View-Only) (Signed)
TOTAL HIP ADMISSION H&P  Patient is admitted for left total hip arthroplasty.  Subjective:  Chief Complaint: left hip pain  HPI: [name], 66 y.o. female, has a history of pain and functional disability in the left hip(s) due to arthritis and patient has failed non-surgical conservative treatments for greater than 12 weeks to include NSAID's and/or analgesics, use of assistive devices, and activity modification.  Onset of symptoms was gradual starting 4 years ago with gradually worsening course since that time.The patient noted no past surgery on the left hip(s).  Patient currently rates pain in the left hip at 9 out of 10 with activity. Patient has night pain, worsening of pain with activity and weight bearing, trendelenberg gait, pain that interfers with activities of daily living, and pain with passive range of motion. Patient has evidence of periarticular osteophytes, joint space narrowing, and signs of AVN without collapse  by imaging studies. This condition presents safety issues increasing the risk of falls. This patient has had avascular necrosis of the hip, acetabular fracture, hip dysplasia.  There is no current active infection.  Patient Active Problem List   Diagnosis Date Noted  . C. difficile colitis 04/08/2022  . Abdominal pain 03/24/2022  . Rectal bleeding 03/24/2022  . External hemorrhoids with complication 02/11/2022  . 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 Supplemental oxygen dependent 10/23/2021  . Diverticulitis of large intestine without perforation or abscess without bleeding 10/23/2021  . Gastroesophageal reflux disease 08/07/2021  . Solitary pulmonary nodule 09/08/2019  . Muscle weakness (generalized)   . Chronic respiratory failure with hypoxia and hypercapnia (HCC) 01/21/2015  . Macrocytic anemia 01/21/2015  . Respiratory failure with hypercapnia (HCC) 11/14/2014  . Anemia 11/14/2014  . Elevated glucose 05/24/2012  . COPD Group D symptoms  12/23/2011  . Hyperlipidemia 12/23/2011   Past Medical History:  Diagnosis Date  . Allergy   . Anemia   . Anxiety   . Arthritis   . COPD (chronic obstructive pulmonary disease) (HCC)    Home oxygen at [facility]  . Depression   . Diverticulosis   . Dyspnea   . Dysrhythmia   . GERD (gastroesophageal reflux disease)   . History of cardiac catheterization    No significant CAD 2005  . Hyperlipidemia   . Hypoglycemia   . IBS (irritable bowel syndrome)   . Neuromuscular disorder (HCC)   . Osteoporosis   . Paroxysmal SVT (supraventricular tachycardia)   . Pneumonia 2015  . Pre-diabetes   . Shingles 2012  . Sleep apnea     Past Surgical History:  Procedure Laterality Date  . BACK SURGERY    . BIOPSY  09/12/2021   Procedure: BIOPSY;  Surgeon: Rehman, Najeeb U, MD;  [facility];  Service: Endoscopy;;  . BREAST SURGERY    . CARDIAC CATHETERIZATION  2004  . COLONOSCOPY  03/22/2011   Procedure: COLONOSCOPY;  Surgeon: Najeeb U Rehman, MD;  [facility];  Service: Endoscopy;  Laterality: N/A;  . COLONOSCOPY WITH PROPOFOL N/A 05/16/2017   Procedure: COLONOSCOPY WITH PROPOFOL;  Surgeon: Rehman, Najeeb U, MD;  [facility];  Service: Endoscopy;  Laterality: N/A;  . COLONOSCOPY WITH PROPOFOL N/A 09/12/2021   Procedure: COLONOSCOPY WITH PROPOFOL;  Surgeon: Rehman, Najeeb U, MD;  [facility];  Service: Endoscopy;  Laterality: N/A;  1230 ASA 2  . ESOPHAGEAL DILATION N/A 04/19/2020   Procedure: ESOPHAGEAL DILATION;  Surgeon: Rehman, Najeeb U, MD;  [facility];  Service:   Endoscopy;  Laterality: N/A;  . ESOPHAGOGASTRODUODENOSCOPY (EGD) WITH PROPOFOL N/A 04/19/2020   Procedure: ESOPHAGOGASTRODUODENOSCOPY (EGD) WITH PROPOFOL;  Surgeon: Rehman, Najeeb U, MD;  [facility];  Service: Endoscopy;  Laterality: N/A;  9:15  . ESOPHAGOGASTRODUODENOSCOPY (EGD) WITH PROPOFOL N/A 09/12/2021   Procedure: ESOPHAGOGASTRODUODENOSCOPY (EGD) WITH  PROPOFOL;  Surgeon: Rehman, Najeeb U, MD;  [facility];  Service: Endoscopy;  Laterality: N/A;  . FLEXIBLE SIGMOIDOSCOPY N/A 09/26/2017   Procedure: FLEXIBLE SIGMOIDOSCOPY with Propofol ;  Surgeon: Rehman, Najeeb U, MD;  [facility];  Service: Endoscopy;  Laterality: N/A;  9:30  . FLEXIBLE SIGMOIDOSCOPY N/A 06/04/2022   Procedure: FLEXIBLE SIGMOIDOSCOPY;  Surgeon: Castaneda Mayorga, Daniel, MD;  [facility];  Service: Gastroenterology;  Laterality: N/A;  10:30am, asa 3  . OSTOMY N/A 02/07/2022   Procedure: POSSIBLE OSTOMY;  Surgeon: Gross, Steven, MD;  [facility];  Service: General;  Laterality: N/A;  . POLYPECTOMY  05/16/2017   Procedure: POLYPECTOMY;  Surgeon: Rehman, Najeeb U, MD;  [facility];  Service: Endoscopy;;  . POLYPECTOMY  09/12/2021   Procedure: POLYPECTOMY INTESTINAL;  Surgeon: Rehman, Najeeb U, MD;  [facility];  Service: Endoscopy;;  . PROCTOSCOPY N/A 02/07/2022   Procedure: RIGID PROCTOSCOPY;  Surgeon: Gross, Steven, MD;  [facility]: [facility];  Service: General;  Laterality: N/A;  . SPINAL FUSION    . VULVECTOMY      Current Outpatient Medications  Medication Sig Dispense Refill Last Dose  . albuterol (VENTOLIN HFA) 108 (90 Base) MCG/ACT inhaler Inhale 1-2 puffs into the lungs every 6 (six) hours as needed for wheezing or shortness of breath.     . ALPRAZolam (XANAX) 0.25 MG tablet Take 0.25 mg by mouth 3 (three) times daily as needed for anxiety.      . aspirin 81 MG tablet Take 1 tablet (81 mg total) by mouth daily. 100 tablet 3   . atorvastatin (LIPITOR) 40 MG tablet TAKE 1 TABLET DAILY 90 tablet 3   . azelastine (ASTELIN) 0.1 % nasal spray Place 1 spray into both nostrils daily as needed for rhinitis or allergies. Use in each nostril as directed     . carboxymethylcellulose 1 % ophthalmic solution Place 2 drops into both eyes 3 (three) times daily as needed (for dry eyes).     . cetirizine (ZYRTEC) 10 MG tablet  Take 10 mg by mouth daily.     . clonazePAM (KLONOPIN) 0.5 MG tablet Take 1 tablet (0.5 mg total) by mouth at bedtime. (Patient taking differently: Take 0.5 mg by mouth at bedtime as needed for anxiety.) 30 tablet 5   . diclofenac Sodium (VOLTAREN) 1 % GEL Apply 2 g topically 2 (two) times daily as needed (pain).     . dicyclomine (BENTYL) 10 MG capsule Take 1 capsule (10 mg total) by mouth 3 (three) times daily as needed for spasms. (Patient not taking: Reported on 07/01/2022) 90 capsule 2   . diltiazem (CARDIZEM) 30 MG tablet Take 1 tablet (30 mg total) by mouth 3 (three) times daily. Take 1-2 tablets every 6 hours as needed for persistent tachycardai (Patient taking differently: Take 30 mg by mouth 3 (three) times daily. Take 1-2 tablets every 6 hours as needed for persistent tachycardia) 90 tablet 3   . diltiazem (DILACOR XR) 240 MG 24 hr capsule Take 240 mg by mouth daily.     . escitalopram (LEXAPRO) 20 MG tablet Take 20 mg by mouth daily.     .   Fluticasone-Umeclidin-Vilant (TRELEGY ELLIPTA) 100-62.5-25 MCG/ACT AEPB Inhale 1 puff into the lungs daily.     . hydrOXYzine (ATARAX) 25 MG tablet Take 0.5 tablets (12.5 mg total) by mouth every 6 (six) hours as needed for itching. 30 tablet 0   . ibuprofen (ADVIL) 200 MG tablet Take 400 mg by mouth every 6 (six) hours as needed for moderate pain.     . methocarbamol (ROBAXIN) 500 MG tablet Take 500-1,000 mg by mouth every 6 (six) hours as needed for muscle spasms.     . montelukast (SINGULAIR) 10 MG tablet Take 1 tablet (10 mg total) by mouth at bedtime. 30 tablet 1   . NON FORMULARY Pt uses a bipap with 4L of oxygen when laying down     . oxybutynin (DITROPAN) 5 MG tablet Take 5 mg by mouth daily.      . OXYGEN Inhale 3.5-7 L into the lungs continuous.     . promethazine (PHENERGAN) 12.5 MG tablet Take 12.5 mg by mouth every 6 (six) hours as needed for vomiting or nausea.     . roflumilast (DALIRESP) 500 MCG TABS tablet Take 500 mcg by mouth daily.      . traMADol (ULTRAM) 50 MG tablet Take 1 tablet (50 mg total) by mouth every 6 (six) hours as needed for moderate pain. 30 tablet 0   . traZODone (DESYREL) 50 MG tablet Take 50 mg by mouth at bedtime as needed for sleep.     . valACYclovir (VALTREX) 500 MG tablet Take 500 mg by mouth daily.     . vancomycin (VANCOCIN) 125 MG capsule Take 125mg po QID for 14 days, then 125mg BID for 7 days, then 125mg daily for 7 days, then 125 mg every 2 days for four weeks, then stop. (Patient taking differently: Take 125 mg by mouth every other day.) 93 capsule 0    No current facility-administered medications for this visit.   Allergies  Allergen Reactions  . Iohexol Anaphylaxis and Other (See Comments)     Desc: IV DYE  ANAPHYLATIC SHOCK X2 ONCE AFTER PREMEDS   . Benadryl [Diphenhydramine]     Makes her stay up instead of drowsy  . Ketek [Telithromycin] Other (See Comments)    Loopy, eyes went different directions  . Penicillins Other (See Comments)    Unknown Has patient had a PCN reaction causing immediate rash, facial/tongue/throat swelling, SOB or lightheadedness with hypotension: Unknown Has patient had a PCN reaction causing severe rash involving mucus membranes or skin necrosis: Unknown Has patient had a PCN reaction that required hospitalization: Unknown Has patient had a PCN reaction occurring within the last 10 years: No If all of the above answers are "NO", then may proceed with Cephalosporin use.  . Adhesive [Tape] Itching and Rash    Break out  . Chantix [Varenicline] Palpitations    Social History   Tobacco Use  . Smoking status: Former    Packs/day: 1.00    Years: 40.00    Total pack years: 40.00    Types: Cigarettes    Quit date: 05/07/2015    Years since quitting: 7.1    Passive exposure: Past  . Smokeless tobacco: Never  Substance Use Topics  . Alcohol use: Yes    Comment: 1 -2 glasses of wine daily    Family History  Problem Relation Age of Onset  . Asthma Mother    . Heart attack Mother   . Diabetes Mother   . Heart failure Mother   .   Colon cancer Neg Hx      Review of Systems  Respiratory:  Positive for shortness of breath.   Cardiovascular:  Positive for chest pain, palpitations and leg swelling.  Gastrointestinal:  Positive for blood in stool.  Genitourinary:  Positive for dysuria and frequency.  Musculoskeletal:  Positive for arthralgias.  Skin:  Positive for rash.  Neurological:  Positive for dizziness, syncope and headaches.  Psychiatric/Behavioral:  The patient is nervous/anxious.   All other systems reviewed and are negative.  Objective:  Physical Exam Constitutional:      General: She is not in acute distress.    Appearance: Normal appearance.  HENT:     Head: Normocephalic and atraumatic.  Eyes:     Extraocular Movements: Extraocular movements intact.     Pupils: Pupils are equal, round, and reactive to light.  Cardiovascular:     Rate and Rhythm: Normal rate and regular rhythm.     Pulses: Normal pulses.     Heart sounds: Normal heart sounds.  Pulmonary:     Effort: Pulmonary effort is normal. No respiratory distress.     Breath sounds: Normal breath sounds. No wheezing.  Abdominal:     General: Abdomen is flat. There is no distension.     Palpations: Abdomen is soft.     Tenderness: There is no abdominal tenderness.  Musculoskeletal:     Cervical back: Normal range of motion and neck supple.     Comments: : Examination of the left lower extremity again shows she is neurovascularly intact.  Intact dorsiflexion and plantarflexion with the ankle.  There is no calf tenderness to palpation.  Painful arc of motion and limited range of motion with the left hip.    Lymphadenopathy:     Cervical: No cervical adenopathy.  Skin:    General: Skin is warm and dry.     Findings: No erythema or rash.  Neurological:     General: No focal deficit present.     Mental Status: She is alert and oriented to person, place, and time.   Psychiatric:        Mood and Affect: Mood normal.        Behavior: Behavior normal.   Vital signs in last 24 hours: @VSRANGES@  Labs:   Estimated body mass index is 22.18 kg/m as calculated from the following:   Height as of 07/02/22: 5' 3" (1.6 m).   Weight as of 07/02/22: 56.8 kg.   Imaging Review Plain radiographs demonstrate  AVN without collapse  degenerative joint disease of the left hip(s). The bone quality appears to be good for age and reported activity level.      Assessment/Plan:  End stage arthritis, left hip(s)  The patient history, physical examination, clinical judgement of the provider and imaging studies are consistent with end stage degenerative joint disease of the left hip(s) and total hip arthroplasty is deemed medically necessary. The treatment options including medical management, injection therapy, arthroscopy and arthroplasty were discussed at length. The risks and benefits of total hip arthroplasty were presented and reviewed. The risks due to aseptic loosening, infection, stiffness, dislocation/subluxation,  thromboembolic complications and other imponderables were discussed.  The patient acknowledged the explanation, agreed to proceed with the plan and consent was signed. Patient is being admitted for inpatient treatment for surgery, pain control, PT, OT, prophylactic antibiotics, VTE prophylaxis, progressive ambulation and ADL's and discharge planning.The patient is planning to be discharged home with home health services   Anticipated LOS equal to or   greater than 2 midnights due to - Age 65 and older with one or more of the following:  - Obesity  - Expected need for hospital services (PT, OT, Nursing) required for safe  discharge  - Anticipated need for postoperative skilled nursing care or inpatient rehab  - Active co-morbidities: Diabetes, Cardiac Arrhythmia, and Respiratory Failure/COPD OR   - Unanticipated findings during/Post Surgery: None  -  Patient is a high risk of re-admission due to: None 

## 2022-07-11 DIAGNOSIS — A0472 Enterocolitis due to Clostridium difficile, not specified as recurrent: Secondary | ICD-10-CM | POA: Diagnosis not present

## 2022-07-11 DIAGNOSIS — J449 Chronic obstructive pulmonary disease, unspecified: Secondary | ICD-10-CM | POA: Diagnosis not present

## 2022-07-15 ENCOUNTER — Encounter (HOSPITAL_COMMUNITY)
Admission: RE | Disposition: A | Discharge status: Home Health | Payer: Self-pay | Source: Ambulatory Visit | Attending: Orthopedic Surgery

## 2022-07-15 ENCOUNTER — Observation Stay (HOSPITAL_COMMUNITY): Payer: Medicare Other

## 2022-07-15 ENCOUNTER — Encounter (HOSPITAL_COMMUNITY): Payer: Self-pay | Admitting: Orthopedic Surgery

## 2022-07-15 ENCOUNTER — Ambulatory Visit (HOSPITAL_COMMUNITY): Payer: Medicare Other

## 2022-07-15 ENCOUNTER — Observation Stay (HOSPITAL_COMMUNITY)
Admission: RE | Admit: 2022-07-15 | Discharge: 2022-07-16 | Disposition: A | Discharge status: Home Health | Payer: Medicare Other | Source: Ambulatory Visit | Attending: Orthopedic Surgery | Admitting: Orthopedic Surgery

## 2022-07-15 ENCOUNTER — Ambulatory Visit (HOSPITAL_COMMUNITY): Payer: Medicare Other | Admitting: Physician Assistant

## 2022-07-15 ENCOUNTER — Other Ambulatory Visit: Payer: Self-pay

## 2022-07-15 ENCOUNTER — Ambulatory Visit (HOSPITAL_BASED_OUTPATIENT_CLINIC_OR_DEPARTMENT_OTHER): Payer: Medicare Other | Admitting: Anesthesiology

## 2022-07-15 DIAGNOSIS — R7303 Prediabetes: Secondary | ICD-10-CM

## 2022-07-15 DIAGNOSIS — S72092A Other fracture of head and neck of left femur, initial encounter for closed fracture: Secondary | ICD-10-CM | POA: Diagnosis not present

## 2022-07-15 DIAGNOSIS — X58XXXA Exposure to other specified factors, initial encounter: Secondary | ICD-10-CM | POA: Insufficient documentation

## 2022-07-15 DIAGNOSIS — M87352 Other secondary osteonecrosis, left femur: Secondary | ICD-10-CM | POA: Diagnosis not present

## 2022-07-15 DIAGNOSIS — S72052A Unspecified fracture of head of left femur, initial encounter for closed fracture: Secondary | ICD-10-CM

## 2022-07-15 DIAGNOSIS — F418 Other specified anxiety disorders: Secondary | ICD-10-CM

## 2022-07-15 DIAGNOSIS — M1990 Unspecified osteoarthritis, unspecified site: Secondary | ICD-10-CM | POA: Diagnosis not present

## 2022-07-15 DIAGNOSIS — J449 Chronic obstructive pulmonary disease, unspecified: Secondary | ICD-10-CM | POA: Diagnosis not present

## 2022-07-15 DIAGNOSIS — Z7982 Long term (current) use of aspirin: Secondary | ICD-10-CM | POA: Insufficient documentation

## 2022-07-15 DIAGNOSIS — Z96642 Presence of left artificial hip joint: Secondary | ICD-10-CM | POA: Diagnosis not present

## 2022-07-15 DIAGNOSIS — M879 Osteonecrosis, unspecified: Secondary | ICD-10-CM

## 2022-07-15 DIAGNOSIS — Z471 Aftercare following joint replacement surgery: Secondary | ICD-10-CM | POA: Diagnosis not present

## 2022-07-15 DIAGNOSIS — S72059A Unspecified fracture of head of unspecified femur, initial encounter for closed fracture: Secondary | ICD-10-CM | POA: Diagnosis present

## 2022-07-15 DIAGNOSIS — Z87891 Personal history of nicotine dependence: Secondary | ICD-10-CM | POA: Insufficient documentation

## 2022-07-15 DIAGNOSIS — Z79899 Other long term (current) drug therapy: Secondary | ICD-10-CM | POA: Insufficient documentation

## 2022-07-15 DIAGNOSIS — S72062A Displaced articular fracture of head of left femur, initial encounter for closed fracture: Secondary | ICD-10-CM | POA: Diagnosis not present

## 2022-07-15 HISTORY — PX: TOTAL HIP ARTHROPLASTY: SHX124

## 2022-07-15 LAB — ABO/RH: ABO/RH(D): O POS

## 2022-07-15 SURGERY — ARTHROPLASTY, HIP, TOTAL,POSTERIOR APPROACH
Anesthesia: Spinal | Site: Hip | Laterality: Left

## 2022-07-15 MED ORDER — HYDROXYZINE HCL 25 MG PO TABS
12.5000 mg | ORAL_TABLET | Freq: Four times a day (QID) | ORAL | Status: DC | PRN
  Administered 2022-07-15: 12.5 mg via ORAL
  Filled 2022-07-15 (×2): qty 1

## 2022-07-15 MED ORDER — PROPOFOL 10 MG/ML IV BOLUS
INTRAVENOUS | Status: DC | PRN
  Administered 2022-07-15 (×5): 10 mg via INTRAVENOUS

## 2022-07-15 MED ORDER — OXYCODONE HCL 5 MG/5ML PO SOLN: 5.0000 mg | Freq: Once | ORAL | Status: DC | PRN

## 2022-07-15 MED ORDER — BUPIVACAINE LIPOSOME 1.3 % IJ SUSP: 20.0000 mL | Freq: Once | INTRAMUSCULAR | Status: DC

## 2022-07-15 MED ORDER — DIPHENHYDRAMINE HCL 12.5 MG/5ML PO ELIX: 12.5000 mg | ORAL_SOLUTION | ORAL | Status: DC | PRN

## 2022-07-15 MED ORDER — ALPRAZOLAM 0.25 MG PO TABS: 0.2500 mg | ORAL_TABLET | Freq: Three times a day (TID) | ORAL | Status: DC | PRN

## 2022-07-15 MED ORDER — SODIUM CHLORIDE 0.9 % IR SOLN
Status: DC | PRN
  Administered 2022-07-15: 1000 mL

## 2022-07-15 MED ORDER — OXYCODONE HCL 5 MG PO TABS
5.0000 mg | ORAL_TABLET | ORAL | Status: DC | PRN
  Administered 2022-07-15 – 2022-07-16 (×2): 10 mg via ORAL
  Filled 2022-07-15 (×3): qty 2

## 2022-07-15 MED ORDER — AMISULPRIDE (ANTIEMETIC) 5 MG/2ML IV SOLN: 10.0000 mg | Freq: Once | INTRAVENOUS | Status: DC | PRN

## 2022-07-15 MED ORDER — PROPOFOL 1000 MG/100ML IV EMUL
INTRAVENOUS | Status: AC
  Filled 2022-07-15: qty 100

## 2022-07-15 MED ORDER — OXYBUTYNIN CHLORIDE 5 MG PO TABS
5.0000 mg | ORAL_TABLET | Freq: Every day | ORAL | Status: DC
  Administered 2022-07-16: 5 mg via ORAL
  Filled 2022-07-15: qty 1

## 2022-07-15 MED ORDER — WATER FOR IRRIGATION, STERILE IR SOLN
Status: DC | PRN
  Administered 2022-07-15: 2000 mL

## 2022-07-15 MED ORDER — HYDROMORPHONE HCL 1 MG/ML IJ SOLN
0.5000 mg | INTRAMUSCULAR | Status: DC | PRN
  Administered 2022-07-15: 0.5 mg via INTRAVENOUS
  Filled 2022-07-15: qty 1

## 2022-07-15 MED ORDER — METHOCARBAMOL 500 MG PO TABS
500.0000 mg | ORAL_TABLET | Freq: Four times a day (QID) | ORAL | Status: DC | PRN
  Administered 2022-07-16: 500 mg via ORAL
  Filled 2022-07-15: qty 1

## 2022-07-15 MED ORDER — BUPIVACAINE LIPOSOME 1.3 % IJ SUSP
INTRAMUSCULAR | Status: AC
  Filled 2022-07-15: qty 20

## 2022-07-15 MED ORDER — 0.9 % SODIUM CHLORIDE (POUR BTL) OPTIME
TOPICAL | Status: DC | PRN
  Administered 2022-07-15: 1000 mL

## 2022-07-15 MED ORDER — ESCITALOPRAM OXALATE 20 MG PO TABS
20.0000 mg | ORAL_TABLET | Freq: Every day | ORAL | Status: DC
  Administered 2022-07-16: 20 mg via ORAL
  Filled 2022-07-15: qty 1

## 2022-07-15 MED ORDER — HYDROXYZINE HCL 25 MG PO TABS: 25.0000 mg | ORAL_TABLET | Freq: Once | ORAL | Status: DC

## 2022-07-15 MED ORDER — TRAZODONE HCL 50 MG PO TABS: 50.0000 mg | ORAL_TABLET | Freq: Every evening | ORAL | Status: DC | PRN

## 2022-07-15 MED ORDER — ATORVASTATIN CALCIUM 40 MG PO TABS
40.0000 mg | ORAL_TABLET | Freq: Every day | ORAL | Status: DC
  Administered 2022-07-16: 40 mg via ORAL
  Filled 2022-07-15: qty 1

## 2022-07-15 MED ORDER — FENTANYL CITRATE (PF) 100 MCG/2ML IJ SOLN
INTRAMUSCULAR | Status: AC
  Filled 2022-07-15: qty 2

## 2022-07-15 MED ORDER — DILTIAZEM HCL ER COATED BEADS 240 MG PO CP24
240.0000 mg | ORAL_CAPSULE | Freq: Every day | ORAL | Status: DC
  Administered 2022-07-16: 240 mg via ORAL
  Filled 2022-07-15: qty 1

## 2022-07-15 MED ORDER — SODIUM CHLORIDE (PF) 0.9 % IJ SOLN
INTRAMUSCULAR | Status: AC
  Filled 2022-07-15: qty 10

## 2022-07-15 MED ORDER — ONDANSETRON HCL 4 MG/2ML IJ SOLN
INTRAMUSCULAR | Status: AC
  Filled 2022-07-15: qty 2

## 2022-07-15 MED ORDER — ROFLUMILAST 500 MCG PO TABS
500.0000 ug | ORAL_TABLET | Freq: Every day | ORAL | Status: DC
  Administered 2022-07-16: 500 ug via ORAL
  Filled 2022-07-15: qty 1

## 2022-07-15 MED ORDER — CARBOXYMETHYLCELLULOSE SODIUM 1 % OP SOLN: 2.0000 [drp] | Freq: Three times a day (TID) | OPHTHALMIC | Status: DC | PRN

## 2022-07-15 MED ORDER — VANCOMYCIN HCL 125 MG PO CAPS
125.0000 mg | ORAL_CAPSULE | ORAL | Status: DC
  Administered 2022-07-16: 125 mg via ORAL
  Filled 2022-07-15: qty 1

## 2022-07-15 MED ORDER — DEXAMETHASONE SODIUM PHOSPHATE 10 MG/ML IJ SOLN
INTRAMUSCULAR | Status: DC | PRN
  Administered 2022-07-15: 10 mg via INTRAVENOUS

## 2022-07-15 MED ORDER — ZOLPIDEM TARTRATE 5 MG PO TABS: 5.0000 mg | ORAL_TABLET | Freq: Every evening | ORAL | Status: DC | PRN

## 2022-07-15 MED ORDER — LACTATED RINGERS IV SOLN: INTRAVENOUS | Status: DC

## 2022-07-15 MED ORDER — ACETAMINOPHEN 500 MG PO TABS
1000.0000 mg | ORAL_TABLET | Freq: Once | ORAL | Status: AC
  Administered 2022-07-15: 1000 mg via ORAL
  Filled 2022-07-15: qty 2

## 2022-07-15 MED ORDER — HYDROXYZINE HCL 25 MG PO TABS
12.5000 mg | ORAL_TABLET | Freq: Once | ORAL | Status: AC
  Administered 2022-07-15: 12.5 mg via ORAL
  Filled 2022-07-15: qty 1

## 2022-07-15 MED ORDER — ORAL CARE MOUTH RINSE: 15.0000 mL | Freq: Once | OROMUCOSAL | Status: AC

## 2022-07-15 MED ORDER — ACETAMINOPHEN 500 MG PO TABS
1000.0000 mg | ORAL_TABLET | Freq: Four times a day (QID) | ORAL | Status: DC
  Administered 2022-07-15 – 2022-07-16 (×3): 1000 mg via ORAL
  Filled 2022-07-15 (×3): qty 2

## 2022-07-15 MED ORDER — POLYETHYLENE GLYCOL 3350 17 G PO PACK: 17.0000 g | PACK | Freq: Every day | ORAL | Status: DC | PRN

## 2022-07-15 MED ORDER — SODIUM CHLORIDE 0.9 % IV SOLN: INTRAVENOUS | Status: DC

## 2022-07-15 MED ORDER — ONDANSETRON HCL 4 MG/2ML IJ SOLN: 4.0000 mg | Freq: Four times a day (QID) | INTRAMUSCULAR | Status: DC | PRN

## 2022-07-15 MED ORDER — DOCUSATE SODIUM 100 MG PO CAPS
100.0000 mg | ORAL_CAPSULE | Freq: Two times a day (BID) | ORAL | Status: DC
  Administered 2022-07-15 – 2022-07-16 (×2): 100 mg via ORAL
  Filled 2022-07-15 (×2): qty 1

## 2022-07-15 MED ORDER — FENTANYL CITRATE PF 50 MCG/ML IJ SOSY
PREFILLED_SYRINGE | INTRAMUSCULAR | Status: AC
  Filled 2022-07-15: qty 1

## 2022-07-15 MED ORDER — PROPOFOL 10 MG/ML IV BOLUS
INTRAVENOUS | Status: AC
  Filled 2022-07-15: qty 20

## 2022-07-15 MED ORDER — BUPIVACAINE HCL (PF) 0.5 % IJ SOLN
INTRAMUSCULAR | Status: DC | PRN
  Administered 2022-07-15: 2.8 mL via INTRATHECAL

## 2022-07-15 MED ORDER — LIDOCAINE HCL (PF) 2 % IJ SOLN
INTRAMUSCULAR | Status: AC
  Filled 2022-07-15: qty 5

## 2022-07-15 MED ORDER — VANCOMYCIN HCL IN DEXTROSE 1-5 GM/200ML-% IV SOLN
1000.0000 mg | INTRAVENOUS | Status: AC
  Administered 2022-07-15: 1000 mg via INTRAVENOUS
  Filled 2022-07-15: qty 200

## 2022-07-15 MED ORDER — POLYVINYL ALCOHOL 1.4 % OP SOLN: 1.0000 [drp] | Freq: Three times a day (TID) | OPHTHALMIC | Status: DC | PRN

## 2022-07-15 MED ORDER — VALACYCLOVIR HCL 500 MG PO TABS
500.0000 mg | ORAL_TABLET | Freq: Every day | ORAL | Status: DC
  Administered 2022-07-16: 500 mg via ORAL
  Filled 2022-07-15: qty 1

## 2022-07-15 MED ORDER — PROMETHAZINE HCL 25 MG/ML IJ SOLN: 6.2500 mg | INTRAMUSCULAR | Status: DC | PRN

## 2022-07-15 MED ORDER — POVIDONE-IODINE 10 % EX SWAB: 2.0000 | Freq: Once | CUTANEOUS | Status: DC

## 2022-07-15 MED ORDER — SENNA 8.6 MG PO TABS
1.0000 | ORAL_TABLET | Freq: Two times a day (BID) | ORAL | Status: DC
  Administered 2022-07-15 – 2022-07-16 (×2): 8.6 mg via ORAL
  Filled 2022-07-15 (×2): qty 1

## 2022-07-15 MED ORDER — OXYCODONE HCL 5 MG PO TABS: 5.0000 mg | ORAL_TABLET | Freq: Once | ORAL | Status: DC | PRN

## 2022-07-15 MED ORDER — FENTANYL CITRATE PF 50 MCG/ML IJ SOSY
25.0000 ug | PREFILLED_SYRINGE | INTRAMUSCULAR | Status: DC | PRN
  Administered 2022-07-15: 50 ug via INTRAVENOUS

## 2022-07-15 MED ORDER — ONDANSETRON HCL 4 MG PO TABS: 4.0000 mg | ORAL_TABLET | Freq: Four times a day (QID) | ORAL | Status: DC | PRN

## 2022-07-15 MED ORDER — PHENYLEPHRINE HCL-NACL 20-0.9 MG/250ML-% IV SOLN
INTRAVENOUS | Status: DC | PRN
  Administered 2022-07-15: 50 ug/min via INTRAVENOUS

## 2022-07-15 MED ORDER — PHENOL 1.4 % MT LIQD: 1.0000 | OROMUCOSAL | Status: DC | PRN

## 2022-07-15 MED ORDER — CEFAZOLIN SODIUM-DEXTROSE 2-4 GM/100ML-% IV SOLN
2.0000 g | Freq: Four times a day (QID) | INTRAVENOUS | Status: AC
  Administered 2022-07-15 – 2022-07-16 (×2): 2 g via INTRAVENOUS
  Filled 2022-07-15 (×2): qty 100

## 2022-07-15 MED ORDER — PROPOFOL 500 MG/50ML IV EMUL
INTRAVENOUS | Status: DC | PRN
  Administered 2022-07-15: 25 ug/kg/min via INTRAVENOUS

## 2022-07-15 MED ORDER — ALBUTEROL SULFATE (2.5 MG/3ML) 0.083% IN NEBU: 2.5000 mg | INHALATION_SOLUTION | Freq: Four times a day (QID) | RESPIRATORY_TRACT | Status: DC | PRN

## 2022-07-15 MED ORDER — SODIUM CHLORIDE (PF) 0.9 % IJ SOLN
INTRAMUSCULAR | Status: AC
  Filled 2022-07-15: qty 50

## 2022-07-15 MED ORDER — MONTELUKAST SODIUM 10 MG PO TABS
10.0000 mg | ORAL_TABLET | Freq: Every day | ORAL | Status: DC
  Administered 2022-07-15: 10 mg via ORAL
  Filled 2022-07-15: qty 1

## 2022-07-15 MED ORDER — PHENYLEPHRINE HCL (PRESSORS) 10 MG/ML IV SOLN
INTRAVENOUS | Status: AC
  Filled 2022-07-15: qty 2

## 2022-07-15 MED ORDER — LIDOCAINE 2% (20 MG/ML) 5 ML SYRINGE
INTRAMUSCULAR | Status: DC | PRN
  Administered 2022-07-15: 60 mg via INTRAVENOUS

## 2022-07-15 MED ORDER — MENTHOL 3 MG MT LOZG: 1.0000 | LOZENGE | OROMUCOSAL | Status: DC | PRN

## 2022-07-15 MED ORDER — DILTIAZEM HCL 30 MG PO TABS: 30.0000 mg | ORAL_TABLET | Freq: Three times a day (TID) | ORAL | Status: DC

## 2022-07-15 MED ORDER — TRANEXAMIC ACID-NACL 1000-0.7 MG/100ML-% IV SOLN
1000.0000 mg | INTRAVENOUS | Status: AC
  Administered 2022-07-15: 1000 mg via INTRAVENOUS
  Filled 2022-07-15: qty 100

## 2022-07-15 MED ORDER — DEXAMETHASONE SODIUM PHOSPHATE 10 MG/ML IJ SOLN
INTRAMUSCULAR | Status: AC
  Filled 2022-07-15: qty 1

## 2022-07-15 MED ORDER — PANTOPRAZOLE SODIUM 40 MG PO TBEC
40.0000 mg | DELAYED_RELEASE_TABLET | Freq: Every day | ORAL | Status: DC
  Administered 2022-07-15: 40 mg via ORAL
  Filled 2022-07-15: qty 1

## 2022-07-15 MED ORDER — METHOCARBAMOL 500 MG IVPB - SIMPLE MED
500.0000 mg | Freq: Four times a day (QID) | INTRAVENOUS | Status: DC | PRN
  Administered 2022-07-15: 500 mg via INTRAVENOUS

## 2022-07-15 MED ORDER — KETOROLAC TROMETHAMINE 15 MG/ML IJ SOLN
7.5000 mg | Freq: Four times a day (QID) | INTRAMUSCULAR | Status: DC
  Administered 2022-07-15 – 2022-07-16 (×2): 7.5 mg via INTRAVENOUS
  Filled 2022-07-15 (×2): qty 1

## 2022-07-15 MED ORDER — METHOCARBAMOL 500 MG IVPB - SIMPLE MED
INTRAVENOUS | Status: AC
  Filled 2022-07-15: qty 55

## 2022-07-15 MED ORDER — SODIUM CHLORIDE (PF) 0.9 % IJ SOLN
INTRAMUSCULAR | Status: DC | PRN
  Administered 2022-07-15: 60 mL

## 2022-07-15 MED ORDER — CHLORHEXIDINE GLUCONATE 0.12 % MT SOLN
15.0000 mL | Freq: Once | OROMUCOSAL | Status: AC
  Administered 2022-07-15: 15 mL via OROMUCOSAL

## 2022-07-15 MED ORDER — MIDAZOLAM HCL 5 MG/5ML IJ SOLN
INTRAMUSCULAR | Status: DC | PRN
  Administered 2022-07-15 (×3): .5 mg via INTRAVENOUS

## 2022-07-15 MED ORDER — ALBUTEROL SULFATE HFA 108 (90 BASE) MCG/ACT IN AERS: 1.0000 | INHALATION_SPRAY | Freq: Four times a day (QID) | RESPIRATORY_TRACT | Status: DC | PRN

## 2022-07-15 MED ORDER — ISOPROPYL ALCOHOL 70 % SOLN
Status: DC | PRN
  Administered 2022-07-15: 1 via TOPICAL

## 2022-07-15 MED ORDER — LORATADINE 10 MG PO TABS
10.0000 mg | ORAL_TABLET | Freq: Every day | ORAL | Status: DC
  Administered 2022-07-16: 10 mg via ORAL
  Filled 2022-07-15: qty 1

## 2022-07-15 MED ORDER — ACETAMINOPHEN 325 MG PO TABS: 325.0000 mg | ORAL_TABLET | Freq: Four times a day (QID) | ORAL | Status: DC | PRN

## 2022-07-15 MED ORDER — BUPIVACAINE LIPOSOME 1.3 % IJ SUSP
INTRAMUSCULAR | Status: DC | PRN
  Administered 2022-07-15: 20 mL

## 2022-07-15 MED ORDER — MIDAZOLAM HCL 2 MG/2ML IJ SOLN
INTRAMUSCULAR | Status: AC
  Filled 2022-07-15: qty 2

## 2022-07-15 MED ORDER — ASPIRIN 81 MG PO CHEW
81.0000 mg | CHEWABLE_TABLET | Freq: Two times a day (BID) | ORAL | Status: DC
  Administered 2022-07-15 – 2022-07-16 (×2): 81 mg via ORAL
  Filled 2022-07-15 (×2): qty 1

## 2022-07-15 MED ORDER — FENTANYL CITRATE (PF) 100 MCG/2ML IJ SOLN
INTRAMUSCULAR | Status: DC | PRN
  Administered 2022-07-15 (×3): 25 ug via INTRAVENOUS

## 2022-07-15 MED ORDER — DICYCLOMINE HCL 10 MG PO CAPS: 10.0000 mg | ORAL_CAPSULE | Freq: Three times a day (TID) | ORAL | Status: DC | PRN

## 2022-07-15 MED ORDER — ONDANSETRON HCL 4 MG/2ML IJ SOLN
INTRAMUSCULAR | Status: DC | PRN
  Administered 2022-07-15: 4 mg via INTRAVENOUS

## 2022-07-15 SURGICAL SUPPLY — 71 items
ADH SKN CLS APL DERMABOND .7 (GAUZE/BANDAGES/DRESSINGS) ×1
APL PRP STRL LF DISP 70% ISPRP (MISCELLANEOUS) ×2
BAG COUNTER SPONGE SURGICOUNT (BAG) IMPLANT
BAG DECANTER FOR FLEXI CONT (MISCELLANEOUS) ×2 IMPLANT
BAG SPEC THK2 15X12 ZIP CLS (MISCELLANEOUS) ×1
BAG SPNG CNTER NS LX DISP (BAG)
BAG ZIPLOCK 12X15 (MISCELLANEOUS) ×2 IMPLANT
BLADE SAW SAG 25X90X1.19 (BLADE) ×2 IMPLANT
CHLORAPREP W/TINT 26 (MISCELLANEOUS) ×4 IMPLANT
COVER SURGICAL LIGHT HANDLE (MISCELLANEOUS) ×2 IMPLANT
DERMABOND ADVANCED .7 DNX12 (GAUZE/BANDAGES/DRESSINGS) ×2 IMPLANT
DRAPE HIP W/POCKET STRL (MISCELLANEOUS) ×2 IMPLANT
DRAPE INCISE IOBAN 66X45 STRL (DRAPES) ×2 IMPLANT
DRAPE INCISE IOBAN 85X60 (DRAPES) ×2 IMPLANT
DRAPE POUCH INSTRU U-SHP 10X18 (DRAPES) ×2 IMPLANT
DRAPE SHEET LG 3/4 BI-LAMINATE (DRAPES) ×6 IMPLANT
DRAPE SURG 17X11 SM STRL (DRAPES) ×2 IMPLANT
DRAPE U-SHAPE 47X51 STRL (DRAPES) ×4 IMPLANT
DRESSING AQUACEL AG SP 3.5X10 (GAUZE/BANDAGES/DRESSINGS) ×2 IMPLANT
DRSG AQUACEL AG ADV 3.5X10 (GAUZE/BANDAGES/DRESSINGS) IMPLANT
DRSG AQUACEL AG SP 3.5X10 (GAUZE/BANDAGES/DRESSINGS) ×1
ELECT BLADE TIP CTD 4 INCH (ELECTRODE) ×2 IMPLANT
ELECT REM PT RETURN 15FT ADLT (MISCELLANEOUS) ×2 IMPLANT
GLOVE BIO SURGEON STRL SZ 6.5 (GLOVE) ×4 IMPLANT
GLOVE BIOGEL PI IND STRL 6.5 (GLOVE) ×2 IMPLANT
GLOVE BIOGEL PI IND STRL 8 (GLOVE) ×2 IMPLANT
GLOVE SURG ORTHO 8.0 STRL STRW (GLOVE) ×4 IMPLANT
GOWN STRL REUS W/ TWL XL LVL3 (GOWN DISPOSABLE) ×4 IMPLANT
GOWN STRL REUS W/TWL XL LVL3 (GOWN DISPOSABLE) ×2
HANDPIECE INTERPULSE COAX TIP (DISPOSABLE)
HEAD BIOLOX HIP 36/-5 (Joint) IMPLANT
HIP BIOLOX HD 36/-5 (Joint) ×1 IMPLANT
HOLDER FOLEY CATH W/STRAP (MISCELLANEOUS) ×2 IMPLANT
HOOD PEEL AWAY T7 (MISCELLANEOUS) ×6 IMPLANT
INSERT 0 DEGREE 36 (Miscellaneous) IMPLANT
JET LAVAGE IRRISEPT WOUND (IRRIGATION / IRRIGATOR)
KIT BASIN OR (CUSTOM PROCEDURE TRAY) ×2 IMPLANT
KIT TURNOVER KIT A (KITS) IMPLANT
LAVAGE JET IRRISEPT WOUND (IRRIGATION / IRRIGATOR) IMPLANT
MANIFOLD NEPTUNE II (INSTRUMENTS) ×2 IMPLANT
MARKER SKIN DUAL TIP RULER LAB (MISCELLANEOUS) ×2 IMPLANT
NDL HYPO 22X1.5 SAFETY MO (MISCELLANEOUS) IMPLANT
NEEDLE HYPO 22X1.5 SAFETY MO (MISCELLANEOUS) IMPLANT
NEEDLE SAFETY HYPO 22GAX1.5 (MISCELLANEOUS)
NS IRRIG 1000ML POUR BTL (IV SOLUTION) ×2 IMPLANT
PACK TOTAL JOINT (CUSTOM PROCEDURE TRAY) ×2 IMPLANT
PRESSURIZER FEMORAL UNIV (MISCELLANEOUS) IMPLANT
PROTECTOR NERVE ULNAR (MISCELLANEOUS) ×2 IMPLANT
RETRIEVER SUT HEWSON (MISCELLANEOUS) ×2 IMPLANT
SCREW HEX LP 6.5X20 (Screw) IMPLANT
SEALER BIPOLAR AQUA 6.0 (INSTRUMENTS) ×2 IMPLANT
SET HNDPC FAN SPRY TIP SCT (DISPOSABLE) IMPLANT
SHELL ACETAB TRIDENT 48 (Shell) IMPLANT
SPIKE FLUID TRANSFER (MISCELLANEOUS) ×6 IMPLANT
STEM HIP 4 127DEG (Stem) IMPLANT
SUCTION FRAZIER HANDLE 12FR (TUBING) ×1
SUCTION TUBE FRAZIER 12FR DISP (TUBING) ×2 IMPLANT
SUT BONE WAX W31G (SUTURE) ×2 IMPLANT
SUT ETHIBOND #5 BRAIDED 30INL (SUTURE) ×2 IMPLANT
SUT MNCRL AB 3-0 PS2 18 (SUTURE) ×2 IMPLANT
SUT STRATAFIX 0 PDS 27 VIOLET (SUTURE) ×1
SUT STRATAFIX PDO 1 14 VIOLET (SUTURE) ×1
SUT STRATFX PDO 1 14 VIOLET (SUTURE) ×1
SUT VIC AB 2-0 CT2 27 (SUTURE) ×4 IMPLANT
SUTURE STRATFX 0 PDS 27 VIOLET (SUTURE) ×2 IMPLANT
SUTURE STRATFX PDO 1 14 VIOLET (SUTURE) ×2 IMPLANT
SYR 20ML LL LF (SYRINGE) ×4 IMPLANT
TOWEL OR 17X26 10 PK STRL BLUE (TOWEL DISPOSABLE) ×2 IMPLANT
TRAY FOLEY MTR SLVR 16FR STAT (SET/KITS/TRAYS/PACK) ×2 IMPLANT
UNDERPAD 30X36 HEAVY ABSORB (UNDERPADS AND DIAPERS) ×2 IMPLANT
WATER STERILE IRR 1000ML POUR (IV SOLUTION) ×4 IMPLANT

## 2022-07-15 NOTE — Op Note (Signed)
07/15/2022  3:57 PM  PATIENT:  [name]   MRN: [id number]  PRE-OPERATIVE DIAGNOSIS: Left subchondral femoral head fracture  POST-OPERATIVE DIAGNOSIS: Left subchondral send femoral head fracture secondary to avascular necrosis  PROCEDURE:  Procedure(s): Left total hip arthroplasty  PREOPERATIVE INDICATIONS:    [name] is an 67 y.o. female who has a diagnosis of Left subchondral femoral head fracture and elected for surgical management after failing conservative treatment.  The risks benefits and alternatives were discussed with the patient including but not limited to the risks of nonoperative treatment, versus surgical intervention including infection, bleeding, nerve injury, periprosthetic fracture, the need for revision surgery, dislocation, leg length discrepancy, blood clots, cardiopulmonary complications, morbidity, mortality, among others, and they were willing to proceed.     OPERATIVE REPORT     SURGEON:  Charlies Constable, MD    ASSISTANT: Dorise Bullion, PA-C, (Present throughout the entire procedure,  necessary for completion of procedure in a timely manner, assisting with retraction, instrumentation, and closure)     ANESTHESIA: Spinal  ESTIMATED BLOOD LOSS: 0000000    COMPLICATIONS:  None.   COMPONENTS:   St[employer] Trident 2 TriTanium 48 mm acetabular shell, 6.5 hex screws x 2, X.3 neutral liner, Accolade 2 size 4 femoral component with 127 degree neck angle, 36 mm -5 mm femoral head Implant Name Type Inv. Item Serial No. Manufacturer Lot No. LRB No. Used Action  SHELL ACETAB TRIDENT 48 - [id number] Shell SHELL ACETAB TRIDENT 48  [employer] [id number] A Left 1 Implanted  SCREW HEX LP 6.5X20 - [id number] Screw SCREW HEX LP 6.5X20  [employer] [id number] Left 1 Implanted  INSERT 0 DEGREE 36 - [id number] Miscellaneous INSERT 0 DEGREE 36  [employer] [id number] Left 1 Implanted  SCREW HEX LP 6.5X20 - [id number] Screw SCREW HEX LP 6.5X20  [employer]  [employer] [id number] Left 1 Implanted  STEM HIP 4 127DEG - [id number] Stem STEM HIP 4 127DEG  [employer] [id number] A Left 1 Implanted  HIP BIOLOX HD 36/-5 - [id number] Joint HIP BIOLOX HD 36/-5  [employer] [id number] Left 1 Implanted      PROCEDURE IN DETAIL:   The patient was met in the holding area and  identified.  The appropriate hip was identified and marked at the operative site.  The patient was then transported to the OR  and  placed under anesthesia.  At that point, the patient was  placed in the lateral decubitus position with the operative side up and  secured to the operating room table  and all bony prominences padded. A subaxillary role was also placed.    The operative lower extremity was prepped from the iliac crest to the distal leg.  Sterile draping was performed.  Preoperative antibiotics, 2 gm of ancef,1 gm of Tranexamic Acid, and 8 mg of Decadron administered. Time out was performed prior to incision.      A routine posterolateral approach was utilized via sharp dissection  carried down to the subcutaneous tissue.  Gross bleeders were Bovie coagulated.  The iliotibial band was identified and incised along the length of the skin incision through the glute max fascia.  Charnley retractor was placed with care to protect the sciatic nerve posteriorly.  With the hip internally rotated, the piriformis tendon was identified and released from the femoral insertion and tagged with a #5 Ethibond.  A capsulotomy was then performed off the femoral insertion and also tagged with a #5 Ethibond.    The femoral neck was exposed, and I  resected the femoral neck based on preoperative templating relative to the lesser trochanter.    I then exposed the deep acetabulum, cleared out any tissue including the ligamentum teres.  After adequate visualization, I excised the labrum.  I then started reaming with a 44 mm reamer, first medializing to the floor of the cotyloid fossa, and then in the  position of the cup aiming towards the greater sciatic notch, matching the version of the transverse acetabular ligament and tucked under the anterior wall. I reamed up to 48 mm reamer with good bony bed preparation and a 48 mm cup was chosen.  The real cup was then impacted into place.  Appropriate version and inclination was confirmed clinically matching their bony anatomy, and also with the use of the jig.  I placed 2 screws in the posterior superior quadrant to augment fixation.  A neutral liner was placed and impacted. It was confirmed to be appropriately seated and the acetabular retractors were removed.    I then prepared the proximal femur using the box cutter, Charnley awl, and then sequentially broached starting with 0 up to a size 3.  A trial broach, neck, and head was utilized, and I reduced the hip and it was found to have excellent stability.  There was no impingement with full extension and 90 degrees external rotation.  The hip was stable at the position of sleep and with 90 degrees flexion and 80 degrees of internal rotation.  Leg lengths were also clinically assessed in the lateral position and felt to be equal. Intra-Op flatplate was obtained and confirmed appropriate component positions.  Good fill of the femur with the size 3 broach.  And restoration of leg length and offset. No evidence or concern for fracture.  Checked the axial stability of the size 3 broach felt that still subsided some was able to work up to the size 4 broach to the same depth.  A final femoral prosthesis size 4 was selected. I then impacted the real femoral prosthesis into place.I again trialed and selected a 36 -5 mm ball. The hip was then reduced and taken through a range of motion. There was no impingement with full extension and 90 degrees external rotation.  The hip was stable at the position of sleep and with 90 degrees flexion and 80 degrees of internal rotation. Leg lengths were  again assessed and felt to  be restored.  We then opened, and I impacted the real head ball into place.  The posterior capsule was then closed with #5 Ethibond.  The piriformis was repaired through the base of the abductor tendon using a [place] suture passer.  I then irrigated the hip copiously with dilute Betadine and with normal saline pulse lavage. Periarticular injection was then performed with Exparel.   We repaired the fascia #1 barbed suture, followed by 0 barbed suture for the subcutaneous fat.  Skin was closed with 2-0 Vicryl and 3-0 Monocryl.  Dermabond and Aquacel dressing were applied. The patient was then awakened and returned to PACU in stable and satisfactory condition.  Leg lengths in the supine position were assessed and felt to be clinically equal. There were no complications.  Post op recs: WB: WBAT LLE, No formal hip precautions Abx: ancef Imaging: PACU pelvis Xray Dressing: Aquacell, keep intact until follow up DVT prophylaxis: Aspirin 81BID starting POD1 Follow up: 2 weeks after surgery for a wound check with Dr. Zachery Dakins at [facility]  Address: [address],  [place], [place] [address]  Office Phone: [phone]   Charlies Constable, MD Orthopedic Surgeon

## 2022-07-15 NOTE — Interval H&P Note (Signed)
The patient has been re-examined, and the chart reviewed, and there have been no interval changes to the documented history and physical.    Plan for L THA for subchondral femoral head fx  The operative side was examined and the patient was confirmed to have sensation to DPN, SPN, TN intact, Motor EHL, ext, flex 5/5, and DP 2+, PT 2+, No significant edema.   The risks, benefits, and alternatives have been discussed at length with patient, and the patient is willing to proceed.  Left hip marked. Consent has been signed.

## 2022-07-15 NOTE — Anesthesia Procedure Notes (Signed)
Spinal  Patient location during procedure: OR Start time: 07/15/2022 2:14 PM End time: 07/15/2022 2:19 PM Reason for block: surgical anesthesia Staffing Performed: anesthesiologist  Anesthesiologist: Murvin Natal, MD Performed by: Murvin Natal, MD Authorized by: Murvin Natal, MD   Preanesthetic Checklist Completed: patient identified, IV checked, risks and benefits discussed, surgical consent, monitors and equipment checked, pre-op evaluation and timeout performed Spinal Block Patient position: sitting Prep: DuraPrep Patient monitoring: cardiac monitor, continuous pulse ox and blood pressure Approach: left paramedian Location: L4-5 Injection technique: single-shot Needle Needle type: Pencan  Needle gauge: 24 G Needle length: 9 cm Assessment Sensory level: T10 Events: CSF return Additional Notes Functioning IV was confirmed and monitors were applied. Sterile prep and drape, including hand hygiene and sterile gloves were used. The patient was positioned and the spine was prepped. The skin was anesthetized with lidocaine.  Free flow of clear CSF was obtained prior to injecting local anesthetic into the CSF.  The spinal needle aspirated freely following injection.  The needle was carefully withdrawn.  The patient tolerated the procedure well.

## 2022-07-15 NOTE — Transfer of Care (Signed)
Immediate Anesthesia Transfer of Care Note  Patient: [name]  Procedure(s) Performed: TOTAL HIP ARTHROPLASTY (Left: Hip)  Patient Location: PACU  Anesthesia Type:Spinal  Level of Consciousness: awake and alert   Airway & Oxygen Therapy: Patient Spontanous Breathing and Patient connected to face mask oxygen  Post-op Assessment: Report given to RN and Post -op Vital signs reviewed and stable  Post vital signs: Reviewed and stable  Last Vitals:  Vitals Value Taken Time  BP 133/67 07/15/22 1628  Temp    Pulse 87 07/15/22 1630  Resp 23 07/15/22 1630  SpO2 100 % 07/15/22 1630  Vitals shown include unvalidated device data.  Last Pain:  Vitals:   07/15/22 1230  TempSrc:   PainSc: 7          Complications: No notable events documented.

## 2022-07-15 NOTE — Discharge Instructions (Signed)

## 2022-07-15 NOTE — Anesthesia Postprocedure Evaluation (Signed)
Anesthesia Post Note  Patient: [name]  Procedure(s) Performed: TOTAL HIP ARTHROPLASTY (Left: Hip)     Patient location during evaluation: PACU Anesthesia Type: Spinal Level of consciousness: oriented and awake and alert Pain management: pain level controlled Vital Signs Assessment: post-procedure vital signs reviewed and stable Respiratory status: spontaneous breathing, respiratory function stable and patient connected to nasal cannula oxygen Cardiovascular status: blood pressure returned to baseline and stable Postop Assessment: no headache, no backache and no apparent nausea or vomiting Anesthetic complications: no  No notable events documented.  Last Vitals:  Vitals:   07/15/22 1730 07/15/22 1810  BP: (!) 113/52 134/68  Pulse: (!) 101 94  Resp: 18   Temp: (!) 36.4 C 36.7 C  SpO2: 93% 99%    Last Pain:  Vitals:   07/15/22 1810  TempSrc: Tympanic  PainSc:                  [name] S

## 2022-07-16 DIAGNOSIS — M87352 Other secondary osteonecrosis, left femur: Secondary | ICD-10-CM | POA: Diagnosis not present

## 2022-07-16 DIAGNOSIS — S72092A Other fracture of head and neck of left femur, initial encounter for closed fracture: Secondary | ICD-10-CM | POA: Diagnosis not present

## 2022-07-16 DIAGNOSIS — J449 Chronic obstructive pulmonary disease, unspecified: Secondary | ICD-10-CM | POA: Diagnosis not present

## 2022-07-16 DIAGNOSIS — Z7982 Long term (current) use of aspirin: Secondary | ICD-10-CM | POA: Diagnosis not present

## 2022-07-16 DIAGNOSIS — Z79899 Other long term (current) drug therapy: Secondary | ICD-10-CM | POA: Diagnosis not present

## 2022-07-16 DIAGNOSIS — Z87891 Personal history of nicotine dependence: Secondary | ICD-10-CM | POA: Diagnosis not present

## 2022-07-16 LAB — BASIC METABOLIC PANEL
Anion gap: 10 (ref 5–15)
BUN: 13 mg/dL (ref 8–23)
CO2: 33 mmol/L — ABNORMAL HIGH (ref 22–32)
Calcium: 8.6 mg/dL — ABNORMAL LOW (ref 8.9–10.3)
Chloride: 96 mmol/L — ABNORMAL LOW (ref 98–111)
Creatinine, Ser: 0.68 mg/dL (ref 0.44–1.00)
GFR, Estimated: >60 mL/min (ref >60)
Glucose, Bld: 159 mg/dL — ABNORMAL HIGH (ref 70–99)
Potassium: 5.6 mmol/L — ABNORMAL HIGH (ref 3.5–5.1)
Sodium: 139 mmol/L (ref 135–145)

## 2022-07-16 LAB — CBC
HCT: 30.5 % — ABNORMAL LOW (ref 36.0–46.0)
Hemoglobin: 9.7 g/dL — ABNORMAL LOW (ref 12.0–15.0)
MCH: 30 pg (ref 26.0–34.0)
MCHC: 31.8 g/dL (ref 30.0–36.0)
MCV: 94.4 fL (ref 80.0–100.0)
Platelets: 263 10*3/uL (ref 150–400)
RBC: 3.23 MIL/uL — ABNORMAL LOW (ref 3.87–5.11)
RDW: 14.8 % (ref 11.5–15.5)
WBC: 11 10*3/uL — ABNORMAL HIGH (ref 4.0–10.5)
nRBC: 0 % (ref 0.0–0.2)

## 2022-07-16 MED ORDER — ACETAMINOPHEN 500 MG PO TABS: 1000.0000 mg | ORAL_TABLET | Freq: Three times a day (TID) | ORAL | 0 refills | Status: AC | PRN

## 2022-07-16 MED ORDER — ASPIRIN 81 MG PO TABS: 81.0000 mg | ORAL_TABLET | Freq: Two times a day (BID) | ORAL | 0 refills | Status: AC

## 2022-07-16 MED ORDER — HYDROMORPHONE HCL 2 MG PO TABS: 2.0000 mg | ORAL_TABLET | ORAL | 0 refills | Status: AC | PRN

## 2022-07-16 MED ORDER — HYDROMORPHONE HCL 2 MG PO TABS
1.0000 mg | ORAL_TABLET | ORAL | Status: DC | PRN
  Administered 2022-07-16: 2 mg via ORAL
  Filled 2022-07-16: qty 1

## 2022-07-16 MED ORDER — ONDANSETRON HCL 4 MG PO TABS: 4.0000 mg | ORAL_TABLET | Freq: Three times a day (TID) | ORAL | 0 refills | Status: AC | PRN

## 2022-07-16 MED ORDER — METHOCARBAMOL 500 MG PO TABS: 500.0000 mg | ORAL_TABLET | Freq: Three times a day (TID) | ORAL | 0 refills | Status: AC | PRN

## 2022-07-16 MED ORDER — HYDROXYZINE HCL 25 MG PO TABS
25.0000 mg | ORAL_TABLET | Freq: Four times a day (QID) | ORAL | Status: DC | PRN
  Administered 2022-07-16: 25 mg via ORAL
  Filled 2022-07-16: qty 1

## 2022-07-16 NOTE — TOC Transition Note (Signed)
Transition of Care [facility]) - CM/SW Discharge Note   Patient Details  Name: [name] MRN: [id number] Date of Birth: [birth year]  Transition of Care [facility]) CM/SW Contact:  Lennart Pall, LCSW Phone Number: 07/16/2022, 10:09 AM   Clinical Narrative:     Met with pt and confirming she has needed DME at home.  HHPT prearranged with [facility] via MD office.  No further TOC needs.   Final next level of care: [place] Barriers to Discharge: No Barriers Identified   Patient Goals and CMS Choice      Discharge Placement                         Discharge Plan and Services Additional resources added to the After Visit Summary for                  DME Arranged: N/A DME Agency: NA       HH Arranged: PT HH Agency: [place]        Social Determinants of Health (SDOH) Interventions SDOH Screenings   Food Insecurity: No Food Insecurity (07/15/2022)  Housing: Low Risk  (07/15/2022)  Transportation Needs: No Transportation Needs (07/15/2022)  Utilities: Not At Risk (07/15/2022)  Depression (PHQ2-9): Low Risk  (05/11/2019)  Tobacco Use: Medium Risk (07/15/2022)     Readmission Risk Interventions     No data to display

## 2022-07-16 NOTE — Evaluation (Signed)
Physical Therapy Evaluation Patient Details Name: [name] MRN: [id number] DOB: 1956-01-16 Today's Date: 07/16/2022  History of Present Illness  67 yo female admitted with femoral head fx. S/P L THA-posterior approach 07/15/22. Hx of COPD-O2 dep at baseline-4L, NM d/o, CAD, SVT  Clinical Impression  On eval, pt was Min guard A for mobility. She walked ~135 feet with a RW. Pain controlled per patient report. O2 87% on 4L O2 (pt reports she uses 4-6L as needed for activity). Assisted pt back to bed at her request so she could try to get some rest. Will plan to have a 2nd session prior to d/c later today       Recommendations for follow up therapy are one component of a multi-disciplinary discharge planning process, led by the attending physician.  Recommendations may be updated based on patient status, additional functional criteria and insurance authorization.  Follow Up Recommendations Follow physician's recommendations for discharge plan and follow up therapies      Assistance Recommended at Discharge Intermittent Supervision/Assistance  Patient can return home with the following  Assistance with cooking/housework;Assist for transportation;Help with stairs or ramp for entrance    Equipment Recommendations None recommended by PT  Recommendations for Other Services       Functional Status Assessment Patient has had a recent decline in their functional status and demonstrates the ability to make significant improvements in function in a reasonable and predictable amount of time.     Precautions / Restrictions Precautions Precautions: Fall Precaution Comments: no formal hip precautions Restrictions Weight Bearing Restrictions: No      Mobility  Bed Mobility Overal bed mobility: Needs Assistance Bed Mobility: Supine to Sit     Supine to sit: Min guard, HOB elevated     General bed mobility comments: Min guard for safety. Increaesd time. Cues provided     Transfers Overall transfer level: Needs assistance Equipment used: Rolling walker (2 wheels) Transfers: Sit to/from Stand Sit to Stand: Min guard           General transfer comment: Cues for safety, technique, hand placement. Min guard for safety    Ambulation/Gait Ambulation/Gait assistance: Min guard Gait Distance (Feet): 135 Feet Assistive device: Rolling walker (2 wheels) Gait Pattern/deviations: Step-through pattern, Decreased stride length       General Gait Details: MIn guard for safety. Cues for sequencing but pt quickly progress to using reciprocal gait pattern. No dizzinss. O2 87% on 4L.  Stairs            Wheelchair Mobility    Modified Rankin (Stroke Patients Only)       Balance Overall balance assessment: Needs assistance         Standing balance support: Bilateral upper extremity supported                                 Pertinent Vitals/Pain Pain Assessment Pain Assessment: 0-10 Pain Score: 3  Pain Location: L hip Pain Descriptors / Indicators: Discomfort, Sore Pain Intervention(s): Monitored during session, Repositioned    Home Living Family/patient expects to be discharged to:: Private residence Living Arrangements: Alone Available Help at Discharge: Available PRN/intermittently;Friend(s) Type of Home: House Home Access: Ramped entrance       Home Layout: Able to live on main level with bedroom/bathroom;Laundry or work area in [employer]: BSC/3in1;Rollator (4 wheels);Cane - single point;Grab bars - tub/shower;Shower seat      Prior Function Prior  Level of Function : Independent/Modified Independent             Mobility Comments: using rollator.       Hand Dominance        Extremity/Trunk Assessment   Upper Extremity Assessment Upper Extremity Assessment: Overall WFL for tasks assessed    Lower Extremity Assessment Lower Extremity Assessment: Generalized weakness        Communication   Communication: No difficulties  Cognition Arousal/Alertness: Awake/alert Behavior During Therapy: WFL for tasks assessed/performed Overall Cognitive Status: Within Functional Limits for tasks assessed                                          General Comments      Exercises Total Joint Exercises Ankle Circles/Pumps: AROM, Both, 10 reps Quad Sets: AROM, Both, 10 reps Heel Slides: AROM, AAROM, Left Hip ABduction/ADduction: AROM, AAROM, Left   Assessment/Plan    PT Assessment Patient needs continued PT services  PT Problem List Decreased strength;Decreased range of motion;Decreased balance;Decreased activity tolerance;Decreased mobility;Decreased knowledge of use of DME;Pain       PT Treatment Interventions DME instruction;Gait training;Functional mobility training;Therapeutic activities;Balance training;Patient/family education;Therapeutic exercise    PT Goals (Current goals can be found in the Care Plan section)  Acute Rehab PT Goals Patient Stated Goal: less pain, regain PLOF PT Goal Formulation: With patient Time For Goal Achievement: 07/30/22 Potential to Achieve Goals: Good    Frequency 7X/week     Co-evaluation               AM-PAC PT "6 Clicks" Mobility  Outcome Measure Help needed turning from your back to your side while in a flat bed without using bedrails?: A Little Help needed moving from lying on your back to sitting on the side of a flat bed without using bedrails?: A Little Help needed moving to and from a bed to a chair (including a wheelchair)?: A Little Help needed standing up from a chair using your arms (e.g., wheelchair or bedside chair)?: A Little Help needed to walk in hospital room?: A Little Help needed climbing 3-5 steps with a railing? : A Little 6 Click Score: 18    End of Session Equipment Utilized During Treatment: Gait belt Activity Tolerance: Patient tolerated treatment well Patient left: in  bed;with call bell/phone within reach   PT Visit Diagnosis: Pain;Other abnormalities of gait and mobility (R26.89) Pain - Right/Left: Left Pain - part of body: Hip    Time: 0347-4259 PT Time Calculation (min) (ACUTE ONLY): 43 min   Charges:   PT Evaluation $PT Eval Low Complexity: 1 Low PT Treatments $Gait Training: 8-22 mins $Therapeutic Exercise: 8-22 mins           Faye Ramsay, PT Acute Rehabilitation  Office: [phone]

## 2022-07-16 NOTE — Discharge Summary (Signed)
Physician Discharge Summary  Patient ID: [name] MRN: [id number] DOB/AGE: 1956/01/20 67 y.o.  Admit date: 07/15/2022 Discharge date: 07/16/2022  Admission Diagnoses:  Fracture of femoral head [facility])  Discharge Diagnoses:  Principal Problem:   Fracture of femoral head (HCC)   Past Medical History:  Diagnosis Date   Allergy    Anemia    Anxiety    Arthritis    COPD (chronic obstructive pulmonary disease) (HCC)    Home oxygen at [facility]   Depression    Diverticulosis    Dyspnea    Dysrhythmia    GERD (gastroesophageal reflux disease)    History of cardiac catheterization    No significant CAD 2005   Hyperlipidemia    Hypoglycemia    IBS (irritable bowel syndrome)    Neuromuscular disorder (HCC)    Osteoporosis    Paroxysmal SVT (supraventricular tachycardia)    Pneumonia 2015   Pre-diabetes    Shingles 2012   Sleep apnea     Surgeries: Procedure(s): TOTAL HIP ARTHROPLASTY on 07/15/2022   Consultants (if any):   Discharged Condition: Improved  Hospital Course: [name] is an 67 y.o. female who was admitted 07/15/2022 with a diagnosis of Fracture of femoral head (HCC) and went to the operating room on 07/15/2022 and underwent the above named procedures.    She was given perioperative antibiotics:  Anti-infectives (From admission, onward)    Start     Dose/Rate Route Frequency Ordered Stop   07/16/22 1000  valACYclovir (VALTREX) tablet 500 mg        500 mg Oral Daily 07/15/22 1645     07/16/22 1000  vancomycin (VANCOCIN) capsule 125 mg       Note to Pharmacy: Take 125mg  po QID for 14 days, then 125mg  BID for 7 days, then 125mg  daily for 7 days, then 125 mg every 2 days for four weeks, then stop.     125 mg Oral Every other day 07/15/22 1645 07/24/22 0959   07/16/22 0600  vancomycin (VANCOCIN) IVPB 1000 mg/200 mL premix        1,000 mg 200 mL/hr over 60 Minutes Intravenous On call to O.R. 07/15/22 1208 07/15/22 1806   07/15/22 2000  ceFAZolin (ANCEF) IVPB  2g/100 mL premix        2 g 200 mL/hr over 30 Minutes Intravenous Every 6 hours 07/15/22 1849 07/16/22 0214     .  She was given sequential compression devices, early ambulation, and aspirin for DVT prophylaxis.  She benefited maximally from the hospital stay and there were no complications.    Recent vital signs:  Vitals:   07/16/22 0309 07/16/22 0637  BP: 123/77 (!) 153/73  Pulse: 86 (!) 101  Resp: 16   Temp: 97.6 F (36.4 C) 98.6 F (37 C)  SpO2: 93%     Recent laboratory studies:  Lab Results  Component Value Date   HGB 9.7 (L) 07/16/2022   HGB 11.7 (L) 07/02/2022   HGB 12.0 05/30/2022   Lab Results  Component Value Date   WBC 11.0 (H) 07/16/2022   PLT 263 07/16/2022   Lab Results  Component Value Date   INR 1.01 01/20/2015   Lab Results  Component Value Date   NA 139 07/16/2022   K 5.6 (H) 07/16/2022   CL 96 (L) 07/16/2022   CO2 33 (H) 07/16/2022   BUN 13 07/16/2022   CREATININE 0.68 07/16/2022   GLUCOSE 159 (H) 07/16/2022    Discharge Medications:   Allergies  as of 07/16/2022       Reactions   Iohexol Anaphylaxis, Other (See Comments)    Desc: IV DYE  ANAPHYLATIC SHOCK X2 ONCE AFTER PREMEDS   Benadryl [diphenhydramine]    Makes her stay up instead of drowsy   Ketek [telithromycin] Other (See Comments)   Loopy, eyes went different directions   Penicillins Other (See Comments)   Unknown Has patient had a PCN reaction causing immediate rash, facial/tongue/throat swelling, SOB or lightheadedness with hypotension: Unknown Has patient had a PCN reaction causing severe rash involving mucus membranes or skin necrosis: Unknown Has patient had a PCN reaction that required hospitalization: Unknown Has patient had a PCN reaction occurring within the last 10 years: No If all of the above answers are "NO", then may proceed with Cephalosporin use.   Adhesive [tape] Itching, Rash   Break out   Chantix [varenicline] Palpitations        Medication List      STOP taking these medications    traMADol 50 MG tablet Commonly known as: ULTRAM       TAKE these medications    acetaminophen 500 MG tablet Commonly known as: TYLENOL Take 2 tablets (1,000 mg total) by mouth every 8 (eight) hours as needed.   albuterol 108 (90 Base) MCG/ACT inhaler Commonly known as: VENTOLIN HFA Inhale 1-2 puffs into the lungs every 6 (six) hours as needed for wheezing or shortness of breath.   ALPRAZolam 0.25 MG tablet Commonly known as: XANAX Take 0.25 mg by mouth 3 (three) times daily as needed for anxiety.   aspirin 81 MG tablet Take 1 tablet (81 mg total) by mouth in the morning and at bedtime. What changed: when to take this   atorvastatin 40 MG tablet Commonly known as: LIPITOR TAKE 1 TABLET DAILY   azelastine 0.1 % nasal spray Commonly known as: ASTELIN Place 1 spray into both nostrils daily as needed for rhinitis or allergies. Use in each nostril as directed   carboxymethylcellulose 1 % ophthalmic solution Place 2 drops into both eyes 3 (three) times daily as needed (for dry eyes).   cetirizine 10 MG tablet Commonly known as: ZYRTEC Take 10 mg by mouth daily.   clonazePAM 0.5 MG tablet Commonly known as: KLONOPIN Take 1 tablet (0.5 mg total) by mouth at bedtime. What changed:  when to take this reasons to take this   diclofenac Sodium 1 % Gel Commonly known as: VOLTAREN Apply 2 g topically 2 (two) times daily as needed (pain).   dicyclomine 10 MG capsule Commonly known as: BENTYL Take 1 capsule (10 mg total) by mouth 3 (three) times daily as needed for spasms.   diltiazem 240 MG 24 hr capsule Commonly known as: DILACOR XR Take 240 mg by mouth daily. What changed: Another medication with the same name was changed. Make sure you understand how and when to take each.   diltiazem 30 MG tablet Commonly known as: Cardizem Take 1 tablet (30 mg total) by mouth 3 (three) times daily. Take 1-2 tablets every 6 hours as needed for  persistent tachycardai What changed:  when to take this reasons to take this additional instructions   escitalopram 20 MG tablet Commonly known as: LEXAPRO Take 20 mg by mouth daily.   HYDROmorphone 2 MG tablet Commonly known as: Dilaudid Take 1 tablet (2 mg total) by mouth every 4 (four) hours as needed for up to 7 days for severe pain.   hydrOXYzine 25 MG tablet Commonly known as: ATARAX  Take 0.5 tablets (12.5 mg total) by mouth every 6 (six) hours as needed for itching.   ibuprofen 200 MG tablet Commonly known as: ADVIL Take 400 mg by mouth every 6 (six) hours as needed for moderate pain.   methocarbamol 500 MG tablet Commonly known as: ROBAXIN Take 500-1,000 mg by mouth every 6 (six) hours as needed for muscle spasms. What changed: Another medication with the same name was added. Make sure you understand how and when to take each.   methocarbamol 500 MG tablet Commonly known as: ROBAXIN Take 1 tablet (500 mg total) by mouth every 8 (eight) hours as needed for up to 10 days for muscle spasms. What changed: You were already taking a medication with the same name, and this prescription was added. Make sure you understand how and when to take each.   montelukast 10 MG tablet Commonly known as: SINGULAIR Take 1 tablet (10 mg total) by mouth at bedtime.   NON FORMULARY Pt uses a bipap with 4L of oxygen when laying down   ondansetron 4 MG tablet Commonly known as: Zofran Take 1 tablet (4 mg total) by mouth every 8 (eight) hours as needed for up to 14 days for nausea or vomiting.   oxybutynin 5 MG tablet Commonly known as: DITROPAN Take 5 mg by mouth daily.   OXYGEN Inhale 3.5-7 L into the lungs continuous.   promethazine 12.5 MG tablet Commonly known as: PHENERGAN Take 12.5 mg by mouth every 6 (six) hours as needed for vomiting or nausea.   roflumilast 500 MCG Tabs tablet Commonly known as: DALIRESP Take 500 mcg by mouth daily.   traZODone 50 MG tablet Commonly  known as: DESYREL Take 50 mg by mouth at bedtime as needed for sleep.   Trelegy Ellipta 100-62.5-25 MCG/ACT Aepb Generic drug: Fluticasone-Umeclidin-Vilant Inhale 1 puff into the lungs daily.   valACYclovir 500 MG tablet Commonly known as: VALTREX Take 500 mg by mouth daily.   vancomycin 125 MG capsule Commonly known as: VANCOCIN Take 125mg  po QID for 14 days, then 125mg  BID for 7 days, then 125mg  daily for 7 days, then 125 mg every 2 days for four weeks, then stop. What changed:  how much to take how to take this when to take this additional instructions        Diagnostic Studies: DG HIP UNILAT W OR W/O PELVIS 2-3 VIEWS LEFT  Result Date: 07/15/2022 CLINICAL DATA:  Postoperative state. EXAM: DG HIP (WITH OR WITHOUT PELVIS) 2-3V LEFT COMPARISON:  Pelvis and left hip radiographs 03/09/2021, intraoperative frontal view of the left hip 07/15/2022 FINDINGS: Interval total left hip arthroplasty. No perihardware lucency is seen to indicate hardware failure or loosening. The visualized bilateral sacroiliac, the right femoroacetabular, the pubic symphysis joint spaces are maintained. Expected lateral left hip subcutaneous air postoperative changes. No acute fracture or dislocation. IMPRESSION: Interval total left hip arthroplasty without evidence of hardware failure. Electronically Signed   By: Neita Garnet M.D.   On: 07/15/2022 21:03   DG Pelvis Portable  Result Date: 07/15/2022 CLINICAL DATA:  Intraoperative EXAM: PORTABLE PELVIS 1-2 VIEWS COMPARISON:  None Available. FINDINGS: Left hip arthroplasty appears in anatomic alignment. There is no fracture identified. IMPRESSION: Left hip arthroplasty appears in anatomic alignment. Electronically Signed   By: Darliss Cheney M.D.   On: 07/15/2022 15:48    Disposition: Discharge disposition: 01-Home or Self Care       Discharge Instructions     Call MD / Call 911   Complete  by: As directed    If you experience chest pain or shortness of  breath, CALL 911 and be transported to the hospital emergency room.  If you develope a fever above 101 F, pus (white drainage) or increased drainage or redness at the wound, or calf pain, call your surgeon's office.   Constipation Prevention   Complete by: As directed    Drink plenty of fluids.  Prune juice may be helpful.  You may use a stool softener, such as Colace (over the counter) 100 mg twice a day.  Use MiraLax (over the counter) for constipation as needed.   Diet - low sodium heart healthy   Complete by: As directed    Increase activity slowly as tolerated   Complete by: As directed    Post-operative opioid taper instructions:   Complete by: As directed    POST-OPERATIVE OPIOID TAPER INSTRUCTIONS: It is important to wean off of your opioid medication as soon as possible. If you do not need pain medication after your surgery it is ok to stop day one. Opioids include: Codeine, Hydrocodone(Norco, Vicodin), Oxycodone(Percocet, oxycontin) and hydromorphone amongst others.  Long term and even short term use of opiods can cause: Increased pain response Dependence Constipation Depression Respiratory depression And more.  Withdrawal symptoms can include Flu like symptoms Nausea, vomiting And more Techniques to manage these symptoms Hydrate well Eat regular healthy meals Stay active Use relaxation techniques(deep breathing, meditating, yoga) Do Not substitute Alcohol to help with tapering If you have been on opioids for less than two weeks and do not have pain than it is ok to stop all together.  Plan to wean off of opioids This plan should start within one week post op of your joint replacement. Maintain the same interval or time between taking each dose and first decrease the dose.  Cut the total daily intake of opioids by one tablet each day Next start to increase the time between doses. The last dose that should be eliminated is the evening dose.                Discharge Instructions      INSTRUCTIONS AFTER JOINT REPLACEMENT   Remove items at home which could result in a fall. This includes throw rugs or furniture in walking pathways ICE to the affected joint every three hours while awake for 30 minutes at a time, for at least the first 3-5 days, and then as needed for pain and swelling.  Continue to use ice for pain and swelling. You may notice swelling that will progress down to the foot and ankle.  This is normal after surgery.  Elevate your leg when you are not up walking on it.   Continue to use the breathing machine you got in the hospital (incentive spirometer) which will help keep your temperature down.  It is common for your temperature to cycle up and down following surgery, especially at night when you are not up moving around and exerting yourself.  The breathing machine keeps your lungs expanded and your temperature down.   DIET:  As you were doing prior to hospitalization, we recommend a well-balanced diet.  DRESSING / WOUND CARE / SHOWERING  Keep the surgical dressing until follow up.  The dressing is water proof, so you can shower without any extra covering.  IF THE DRESSING FALLS OFF or the wound gets wet inside, change the dressing with sterile gauze.  Please use good hand washing techniques before changing the dressing.  Do not use any lotions or creams on the incision until instructed by your surgeon.    ACTIVITY  Increase activity slowly as tolerated, but follow the weight bearing instructions below.   No driving for 6 weeks or until further direction given by your physician.  You cannot drive while taking narcotics.  No lifting or carrying greater than 10 lbs. until further directed by your surgeon. Avoid periods of inactivity such as sitting longer than an hour when not asleep. This helps prevent blood clots.  You may return to work once you are authorized by your doctor.     WEIGHT BEARING   Weight bearing  as tolerated with assist device (walker, cane, etc) as directed, use it as long as suggested by your surgeon or therapist, typically at least 4-6 weeks.   EXERCISES  Results after joint replacement surgery are often greatly improved when you follow the exercise, range of motion and muscle strengthening exercises prescribed by your doctor. Safety measures are also important to protect the joint from further injury. Any time any of these exercises cause you to have increased pain or swelling, decrease what you are doing until you are comfortable again and then slowly increase them. If you have problems or questions, call your caregiver or physical therapist for advice.   Rehabilitation is important following a joint replacement. After just a few days of immobilization, the muscles of the leg can become weakened and shrink (atrophy).  These exercises are designed to build up the tone and strength of the thigh and leg muscles and to improve motion. Often times heat used for twenty to thirty minutes before working out will loosen up your tissues and help with improving the range of motion but do not use heat for the first two weeks following surgery (sometimes heat can increase post-operative swelling).   These exercises can be done on a training (exercise) mat, on the floor, on a table or on a bed. Use whatever works the best and is most comfortable for you.    Use music or television while you are exercising so that the exercises are a pleasant break in your day. This will make your life better with the exercises acting as a break in your routine that you can look forward to.   Perform all exercises about fifteen times, three times per day or as directed.  You should exercise both the operative leg and the other leg as well.  Exercises include:   Quad Sets - Tighten up the muscle on the front of the thigh (Quad) and hold for 5-10 seconds.   Straight Leg Raises - With your knee straight (if you were given  a brace, keep it on), lift the leg to 60 degrees, hold for 3 seconds, and slowly lower the leg.  Perform this exercise against resistance later as your leg gets stronger.  Leg Slides: Lying on your back, slowly slide your foot toward your buttocks, bending your knee up off the floor (only go as far as is comfortable). Then slowly slide your foot back down until your leg is flat on the floor again.  Angel Wings: Lying on your back spread your legs to the side as far apart as you can without causing discomfort.  Hamstring Strength:  Lying on your back, push your heel against the floor with your leg straight by tightening up the muscles of your buttocks.  Repeat, but this time bend your knee to a comfortable angle, and push your heel against  the floor.  You may put a pillow under the heel to make it more comfortable if necessary.   A rehabilitation program following joint replacement surgery can speed recovery and prevent re-injury in the future due to weakened muscles. Contact your doctor or a physical therapist for more information on knee rehabilitation.    CONSTIPATION  Constipation is defined medically as fewer than three stools per week and severe constipation as less than one stool per week.  Even if you have a regular bowel pattern at home, your normal regimen is likely to be disrupted due to multiple reasons following surgery.  Combination of anesthesia, postoperative narcotics, change in appetite and fluid intake all can affect your bowels.   YOU MUST use at least one of the following options; they are listed in order of increasing strength to get the job done.  They are all available over the counter, and you may need to use some, POSSIBLY even all of these options:    Drink plenty of fluids (prune juice may be helpful) and high fiber foods Colace 100 mg by mouth twice a day  Senokot for constipation as directed and as needed Dulcolax (bisacodyl), take with full glass of water  Miralax  (polyethylene glycol) once or twice a day as needed.  If you have tried all these things and are unable to have a bowel movement in the first 3-4 days after surgery call either your surgeon or your primary doctor.    If you experience loose stools or diarrhea, hold the medications until you stool forms back up.  If your symptoms do not get better within 1 week or if they get worse, check with your doctor.  If you experience "the worst abdominal pain ever" or develop nausea or vomiting, please contact the office immediately for further recommendations for treatment.   ITCHING:  If you experience itching with your medications, try taking only a single pain pill, or even half a pain pill at a time.  You can also use Benadryl over the counter for itching or also to help with sleep.   TED HOSE STOCKINGS:  Use stockings on both legs until for at least 2 weeks or as directed by physician office. They may be removed at night for sleeping.  MEDICATIONS:  See your medication summary on the "After Visit Summary" that nursing will review with you.  You may have some home medications which will be placed on hold until you complete the course of blood thinner medication.  It is important for you to complete the blood thinner medication as prescribed.   Blood clot prevention (DVT Prophylaxis): After surgery you are at an increased risk for a blood clot. you were prescribed a blood thinner, Aspirin 81mg , to be taken twice daily for a total of 4 weeks from surgery to help reduce your risk of getting a blood clot. This will help prevent a blood clot. Signs of a pulmonary embolus (blood clot in the lungs) include sudden short of breath, feeling lightheaded or dizzy, chest pain with a deep breath, rapid pulse rapid breathing. Signs of a blood clot in your arms or legs include new unexplained swelling and cramping, warm, red or darkened skin around the painful area. Please call the office or 911 right away if these signs  or symptoms develop.  PRECAUTIONS:  If you experience chest pain or shortness of breath - call 911 immediately for transfer to the hospital emergency department.   If you develop a fever greater  that 101 F, purulent drainage from wound, increased redness or drainage from wound, foul odor from the wound/dressing, or calf pain - CONTACT YOUR SURGEON.                                                   FOLLOW-UP APPOINTMENTS:  If you do not already have a post-op appointment, please call the office for an appointment to be seen by your surgeon.  Guidelines for how soon to be seen are listed in your "After Visit Summary", but are typically between 2-3 weeks after surgery.  OTHER INSTRUCTIONS:   POST-OPERATIVE OPIOID TAPER INSTRUCTIONS: It is important to wean off of your opioid medication as soon as possible. If you do not need pain medication after your surgery it is ok to stop day one. Opioids include: Codeine, Hydrocodone(Norco, Vicodin), Oxycodone(Percocet, oxycontin) and hydromorphone amongst others.  Long term and even short term use of opiods can cause: Increased pain response Dependence Constipation Depression Respiratory depression And more.  Withdrawal symptoms can include Flu like symptoms Nausea, vomiting And more Techniques to manage these symptoms Hydrate well Eat regular healthy meals Stay active Use relaxation techniques(deep breathing, meditating, yoga) Do Not substitute Alcohol to help with tapering If you have been on opioids for less than two weeks and do not have pain than it is ok to stop all together.  Plan to wean off of opioids This plan should start within one week post op of your joint replacement. Maintain the same interval or time between taking each dose and first decrease the dose.  Cut the total daily intake of opioids by one tablet each day Next start to increase the time between doses. The last dose that should be eliminated is the evening dose.    MAKE SURE YOU:  Understand these instructions.  Get help right away if you are not doing well or get worse.    Thank you for letting us be a part of your medical care team.  It is a privilege we respect greatly.  We hope these instructions will help you stay on track for a fast and full recovery!           Signed: Johnchristopher Sarvis A Gicela Schwarting 07/16/2022, 6:47 AM

## 2022-07-16 NOTE — Progress Notes (Signed)
Physical Therapy Treatment Patient Details Name: [name] MRN: [id number] DOB: 13-Aug-1955 Today's Date: 07/16/2022   History of Present Illness 67 yo female admitted with femoral head fx. S/P L THA-posterior approach 07/15/22. Hx of COPD-O2 dep at baseline-4L, NM d/o, CAD, SVT    PT Comments    2nd session. Once back in room, pt c/o fatigue and stated "my breathing feels off today and I can feel my heart racing". Once able to get reading on dynamap, O2 98% on 4L, HR 133 bpm. RN Jen to room to assess and because pt had some questions about meds. With time, HR down to 107 bpm. Discussed d/c plan with pt and RN (while still in the room)-pt stated she would like to d/c home today. RN okay with that as well. Instructed pt to have HHPT montior HR and O2 when they come out to work with her. Encouraged her to ambulate often as tolerated and to try to get some good rest tonight. All PT education completed.     Recommendations for follow up therapy are one component of a multi-disciplinary discharge planning process, led by the attending physician.  Recommendations may be updated based on patient status, additional functional criteria and insurance authorization.  Follow Up Recommendations  Follow physician's recommendations for discharge plan and follow up therapies     Assistance Recommended at Discharge Intermittent Supervision/Assistance  Patient can return home with the following Assistance with cooking/housework;Assist for transportation;Help with stairs or ramp for entrance   Equipment Recommendations  None recommended by PT    Recommendations for Other Services       Precautions / Restrictions Precautions Precautions: Fall Precaution Comments: no formal hip precautions Restrictions Weight Bearing Restrictions: No Other Position/Activity Restrictions: wbat     Mobility  Bed Mobility Overal bed mobility: Needs Assistance Bed Mobility: Supine to Sit     Supine to sit:  Supervision, HOB elevated     General bed mobility comments: Min guard for safety. Increaesd time. Cues provided    Transfers Overall transfer level: Needs assistance Equipment used: Rolling walker (2 wheels) Transfers: Sit to/from Stand Sit to Stand: Supervision           General transfer comment: Cues for safety, technique, hand placement. Min guard for safety    Ambulation/Gait Ambulation/Gait assistance: Supervision Gait Distance (Feet): 185 Feet Assistive device: Rolling walker (2 wheels) Gait Pattern/deviations: Step-through pattern, Decreased stride length       General Gait Details: Supv for safety. Pt reported fatigue, dyspnea and heart racing. Once able to get a reading on dynamap, O2 98% on 4L, HR 133 bpm.   Stairs             Wheelchair Mobility    Modified Rankin (Stroke Patients Only)       Balance Overall balance assessment: Needs assistance         Standing balance support: Bilateral upper extremity supported                                Cognition Arousal/Alertness: Awake/alert Behavior During Therapy: WFL for tasks assessed/performed Overall Cognitive Status: Within Functional Limits for tasks assessed                                          Exercises     General Comments  Pertinent Vitals/Pain Pain Assessment Pain Assessment: 0-10 Pain Score: 3  Pain Location: L hip Pain Descriptors / Indicators: Discomfort, Sore Pain Intervention(s): Monitored during session    Home Living Family/patient expects to be discharged to:: Private residence Living Arrangements: Alone Available Help at Discharge: Available PRN/intermittently;Friend(s) Type of Home: House Home Access: Ramped entrance       Home Layout: Able to live on main level with bedroom/bathroom;Laundry or work area in [employer]: BSC/3in1;Rollator (4 wheels);Cane - single point;Grab bars - tub/shower;Shower seat       Prior Function            PT Goals (current goals can now be found in the care plan section) Acute Rehab PT Goals Patient Stated Goal: less pain, regain PLOF PT Goal Formulation: With patient Time For Goal Achievement: 07/30/22 Potential to Achieve Goals: Good Progress towards PT goals: Progressing toward goals    Frequency    7X/week      PT Plan Current plan remains appropriate    Co-evaluation              AM-PAC PT "6 Clicks" Mobility   Outcome Measure  Help needed turning from your back to your side while in a flat bed without using bedrails?: None Help needed moving from lying on your back to sitting on the side of a flat bed without using bedrails?: None Help needed moving to and from a bed to a chair (including a wheelchair)?: A Little Help needed standing up from a chair using your arms (e.g., wheelchair or bedside chair)?: A Little Help needed to walk in hospital room?: A Little Help needed climbing 3-5 steps with a railing? : A Little 6 Click Score: 20    End of Session Equipment Utilized During Treatment: Gait belt Activity Tolerance: Patient tolerated treatment well Patient left: in bed;with call bell/phone within reach   PT Visit Diagnosis: Pain;Other abnormalities of gait and mobility (R26.89) Pain - Right/Left: Left Pain - part of body: Hip     Time: 0272-5366 PT Time Calculation (min) (ACUTE ONLY): 24 min  Charges:  $Gait Training: 23-37 mins $Therapeutic Exercise: 8-22 mins                         Faye Ramsay, PT Acute Rehabilitation  Office: [phone]

## 2022-07-16 NOTE — Progress Notes (Signed)
     Subjective:  Feels great this morning from a pain control standpoint. Bothered by itching improved overnight somewhat with hydroxyzine. Will switch oxy to dilaudid to see if this better as this is what she has used at home. Denies distal n/t. Eager to [employer] home today if clears PT.  Objective:   VITALS:   Vitals:   07/15/22 2105 07/15/22 2240 07/16/22 0309 07/16/22 0637  BP:  96/76 123/77 (!) 153/73  Pulse: (!) 104 93 86 (!) 101  Resp: 18 16 16    Temp:  (!) 97.5 F (36.4 C) 97.6 F (36.4 C) 98.6 F (37 C)  TempSrc:  Oral Oral Oral  SpO2: 95% 95% 93%   Weight:      Height:        Sensation intact distally Intact pulses distally Dorsiflexion/Plantar flexion intact Incision: dressing C/D/I Compartment soft   Lab Results  Component Value Date   WBC 11.0 (H) 07/16/2022   HGB 9.7 (L) 07/16/2022   HCT 30.5 (L) 07/16/2022   MCV 94.4 07/16/2022   PLT 263 07/16/2022   BMET    Component Value Date/Time   NA 139 07/16/2022 0327   NA 140 03/19/2022 1320   K 5.6 (H) 07/16/2022 0327   CL 96 (L) 07/16/2022 0327   CO2 33 (H) 07/16/2022 0327   GLUCOSE 159 (H) 07/16/2022 0327   BUN 13 07/16/2022 0327   BUN 8 03/19/2022 1320   CREATININE 0.68 07/16/2022 0327   CREATININE 0.83 03/08/2011 0956   CALCIUM 8.6 (L) 07/16/2022 0327   EGFR 99 03/19/2022 1320   GFRNONAA >60 07/16/2022 0327    Xray: THA components in good position no adverse features  Assessment/Plan: 1 Day Post-Op   Principal Problem:   Fracture of femoral head ([place])  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 Zachery Dakins at [facility].  Address: [address] [place], [place], [place] [address]  Office Phone: [phone]      [name] 07/16/2022, 6:40 AM   Charlies Constable, MD  Contact information:   [phone] 7am-5pm epic message Dr. Zachery Dakins, or call office for patient follow up: [phone] After hours and holidays please check [email or web address] for group call information for [facility]

## 2022-07-17 ENCOUNTER — Encounter (HOSPITAL_COMMUNITY): Payer: Self-pay | Admitting: Orthopedic Surgery

## 2022-07-17 ENCOUNTER — Other Ambulatory Visit: Payer: Self-pay | Admitting: Gastroenterology

## 2022-07-17 DIAGNOSIS — F32A Depression, unspecified: Secondary | ICD-10-CM | POA: Diagnosis not present

## 2022-07-17 DIAGNOSIS — Z792 Long term (current) use of antibiotics: Secondary | ICD-10-CM | POA: Diagnosis not present

## 2022-07-17 DIAGNOSIS — G4730 Sleep apnea, unspecified: Secondary | ICD-10-CM | POA: Diagnosis not present

## 2022-07-17 DIAGNOSIS — D509 Iron deficiency anemia, unspecified: Secondary | ICD-10-CM | POA: Diagnosis not present

## 2022-07-17 DIAGNOSIS — E785 Hyperlipidemia, unspecified: Secondary | ICD-10-CM | POA: Diagnosis not present

## 2022-07-17 DIAGNOSIS — M80052D Age-related osteoporosis with current pathological fracture, left femur, subsequent encounter for fracture with routine healing: Secondary | ICD-10-CM | POA: Diagnosis not present

## 2022-07-17 DIAGNOSIS — K5730 Diverticulosis of large intestine without perforation or abscess without bleeding: Secondary | ICD-10-CM | POA: Diagnosis not present

## 2022-07-17 DIAGNOSIS — Z7951 Long term (current) use of inhaled steroids: Secondary | ICD-10-CM | POA: Diagnosis not present

## 2022-07-17 DIAGNOSIS — Z87891 Personal history of nicotine dependence: Secondary | ICD-10-CM | POA: Diagnosis not present

## 2022-07-17 DIAGNOSIS — K219 Gastro-esophageal reflux disease without esophagitis: Secondary | ICD-10-CM | POA: Diagnosis not present

## 2022-07-17 DIAGNOSIS — J9612 Chronic respiratory failure with hypercapnia: Secondary | ICD-10-CM | POA: Diagnosis not present

## 2022-07-17 DIAGNOSIS — K589 Irritable bowel syndrome without diarrhea: Secondary | ICD-10-CM | POA: Diagnosis not present

## 2022-07-17 DIAGNOSIS — A0472 Enterocolitis due to Clostridium difficile, not specified as recurrent: Secondary | ICD-10-CM | POA: Diagnosis not present

## 2022-07-17 DIAGNOSIS — J9611 Chronic respiratory failure with hypoxia: Secondary | ICD-10-CM | POA: Diagnosis not present

## 2022-07-17 DIAGNOSIS — F419 Anxiety disorder, unspecified: Secondary | ICD-10-CM | POA: Diagnosis not present

## 2022-07-17 DIAGNOSIS — I471 Supraventricular tachycardia, unspecified: Secondary | ICD-10-CM | POA: Diagnosis not present

## 2022-07-17 DIAGNOSIS — Z8701 Personal history of pneumonia (recurrent): Secondary | ICD-10-CM | POA: Diagnosis not present

## 2022-07-17 DIAGNOSIS — J449 Chronic obstructive pulmonary disease, unspecified: Secondary | ICD-10-CM | POA: Diagnosis not present

## 2022-07-17 DIAGNOSIS — Z96642 Presence of left artificial hip joint: Secondary | ICD-10-CM | POA: Diagnosis not present

## 2022-07-17 DIAGNOSIS — M1990 Unspecified osteoarthritis, unspecified site: Secondary | ICD-10-CM | POA: Diagnosis not present

## 2022-07-17 DIAGNOSIS — I10 Essential (primary) hypertension: Secondary | ICD-10-CM | POA: Diagnosis not present

## 2022-07-17 NOTE — Telephone Encounter (Signed)
Lmom for pt to return my call on home and mobile numbers.

## 2022-07-17 NOTE — Telephone Encounter (Signed)
I did a courtesy refill X 1 at her last ov. Please ask she request refill from the original prescriber.

## 2022-07-18 ENCOUNTER — Other Ambulatory Visit: Payer: Self-pay | Admitting: Gastroenterology

## 2022-07-18 NOTE — Telephone Encounter (Signed)
Pt was made aware.  

## 2022-07-20 DIAGNOSIS — J9611 Chronic respiratory failure with hypoxia: Secondary | ICD-10-CM | POA: Diagnosis not present

## 2022-07-20 DIAGNOSIS — J9612 Chronic respiratory failure with hypercapnia: Secondary | ICD-10-CM | POA: Diagnosis not present

## 2022-07-20 DIAGNOSIS — M1990 Unspecified osteoarthritis, unspecified site: Secondary | ICD-10-CM | POA: Diagnosis not present

## 2022-07-20 DIAGNOSIS — I10 Essential (primary) hypertension: Secondary | ICD-10-CM | POA: Diagnosis not present

## 2022-07-20 DIAGNOSIS — J449 Chronic obstructive pulmonary disease, unspecified: Secondary | ICD-10-CM | POA: Diagnosis not present

## 2022-07-20 DIAGNOSIS — M80052D Age-related osteoporosis with current pathological fracture, left femur, subsequent encounter for fracture with routine healing: Secondary | ICD-10-CM | POA: Diagnosis not present

## 2022-07-22 DIAGNOSIS — J449 Chronic obstructive pulmonary disease, unspecified: Secondary | ICD-10-CM | POA: Diagnosis not present

## 2022-07-22 DIAGNOSIS — M1990 Unspecified osteoarthritis, unspecified site: Secondary | ICD-10-CM | POA: Diagnosis not present

## 2022-07-22 DIAGNOSIS — J9611 Chronic respiratory failure with hypoxia: Secondary | ICD-10-CM | POA: Diagnosis not present

## 2022-07-22 DIAGNOSIS — J9612 Chronic respiratory failure with hypercapnia: Secondary | ICD-10-CM | POA: Diagnosis not present

## 2022-07-22 DIAGNOSIS — I10 Essential (primary) hypertension: Secondary | ICD-10-CM | POA: Diagnosis not present

## 2022-07-22 DIAGNOSIS — M80052D Age-related osteoporosis with current pathological fracture, left femur, subsequent encounter for fracture with routine healing: Secondary | ICD-10-CM | POA: Diagnosis not present

## 2022-07-24 DIAGNOSIS — J449 Chronic obstructive pulmonary disease, unspecified: Secondary | ICD-10-CM | POA: Diagnosis not present

## 2022-07-24 DIAGNOSIS — I10 Essential (primary) hypertension: Secondary | ICD-10-CM | POA: Diagnosis not present

## 2022-07-24 DIAGNOSIS — M80052D Age-related osteoporosis with current pathological fracture, left femur, subsequent encounter for fracture with routine healing: Secondary | ICD-10-CM | POA: Diagnosis not present

## 2022-07-24 DIAGNOSIS — J9611 Chronic respiratory failure with hypoxia: Secondary | ICD-10-CM | POA: Diagnosis not present

## 2022-07-24 DIAGNOSIS — M1990 Unspecified osteoarthritis, unspecified site: Secondary | ICD-10-CM | POA: Diagnosis not present

## 2022-07-24 DIAGNOSIS — J9612 Chronic respiratory failure with hypercapnia: Secondary | ICD-10-CM | POA: Diagnosis not present

## 2022-07-26 DIAGNOSIS — M80052D Age-related osteoporosis with current pathological fracture, left femur, subsequent encounter for fracture with routine healing: Secondary | ICD-10-CM | POA: Diagnosis not present

## 2022-07-26 DIAGNOSIS — I10 Essential (primary) hypertension: Secondary | ICD-10-CM | POA: Diagnosis not present

## 2022-07-26 DIAGNOSIS — J9612 Chronic respiratory failure with hypercapnia: Secondary | ICD-10-CM | POA: Diagnosis not present

## 2022-07-26 DIAGNOSIS — J9611 Chronic respiratory failure with hypoxia: Secondary | ICD-10-CM | POA: Diagnosis not present

## 2022-07-26 DIAGNOSIS — M1990 Unspecified osteoarthritis, unspecified site: Secondary | ICD-10-CM | POA: Diagnosis not present

## 2022-07-26 DIAGNOSIS — J449 Chronic obstructive pulmonary disease, unspecified: Secondary | ICD-10-CM | POA: Diagnosis not present

## 2022-07-30 DIAGNOSIS — S72062A Displaced articular fracture of head of left femur, initial encounter for closed fracture: Secondary | ICD-10-CM | POA: Diagnosis not present

## 2022-08-02 DIAGNOSIS — I10 Essential (primary) hypertension: Secondary | ICD-10-CM | POA: Diagnosis not present

## 2022-08-02 DIAGNOSIS — M80052D Age-related osteoporosis with current pathological fracture, left femur, subsequent encounter for fracture with routine healing: Secondary | ICD-10-CM | POA: Diagnosis not present

## 2022-08-02 DIAGNOSIS — M1990 Unspecified osteoarthritis, unspecified site: Secondary | ICD-10-CM | POA: Diagnosis not present

## 2022-08-02 DIAGNOSIS — J9612 Chronic respiratory failure with hypercapnia: Secondary | ICD-10-CM | POA: Diagnosis not present

## 2022-08-02 DIAGNOSIS — J9611 Chronic respiratory failure with hypoxia: Secondary | ICD-10-CM | POA: Diagnosis not present

## 2022-08-02 DIAGNOSIS — J449 Chronic obstructive pulmonary disease, unspecified: Secondary | ICD-10-CM | POA: Diagnosis not present

## 2022-08-04 DIAGNOSIS — I2510 Atherosclerotic heart disease of native coronary artery without angina pectoris: Secondary | ICD-10-CM | POA: Diagnosis not present

## 2022-08-04 DIAGNOSIS — J449 Chronic obstructive pulmonary disease, unspecified: Secondary | ICD-10-CM | POA: Diagnosis not present

## 2022-08-04 DIAGNOSIS — J9611 Chronic respiratory failure with hypoxia: Secondary | ICD-10-CM | POA: Diagnosis not present

## 2022-08-06 ENCOUNTER — Telehealth: Payer: Self-pay | Admitting: Pulmonary Disease

## 2022-08-06 DIAGNOSIS — G4733 Obstructive sleep apnea (adult) (pediatric): Secondary | ICD-10-CM

## 2022-08-06 NOTE — Telephone Encounter (Signed)
Okay to send order to change DMEs.  She uses Bipap 14/6 cm H2O.

## 2022-08-06 NOTE — Telephone Encounter (Signed)
Patient called and states she needs a new bipap order, states she usually goes through [facility], but they are doing away with liquid, so she is looking into [facility]. Please call patient to discuss.

## 2022-08-06 NOTE — Telephone Encounter (Signed)
Dr. Halford Chessman are you okay with [place] placing order to change DME? If so what settings should we put in order for Bipap?

## 2022-08-06 NOTE — Telephone Encounter (Signed)
Order placed to change DMEs to [facility]

## 2022-08-07 ENCOUNTER — Telehealth: Payer: Self-pay | Admitting: Pulmonary Disease

## 2022-08-07 NOTE — Telephone Encounter (Signed)
Called and spoke with patient and she states she wants to change DME from [facility] to [facility] for O2 ONLY.  She states that [facility] no longer has liquid O2 and she uses liquid O2.  She states per [facility] the fax number is [phone] and the order needs to state liquid O2 and 2 marathon containers.  Will pend order, Dr. Halford Chessman are you okay with this?

## 2022-08-07 NOTE — Progress Notes (Unsigned)
Cardiology Office Note  Date: 08/08/2022   ID: [name], DOB November 19, 1955, MRN [id number]  History of Present Illness: [name] is a 67 y.o. female last evaluated by Ms. Swinyer NP in January, I reviewed the note.  She is here today with a caregiver for follow-up.  She is most recently status post left hip arthroplasty in mid March, still recuperating and using a rolling walker.  Also had interval trouble with C. difficile and recently completed course of vancomycin.  She reports no active symptoms at this time.  From a cardiac perspective she does report intermittent palpitations as before.  Had a more intense episode prior to her hip surgery in March more associated with chest pressure.  States that it felt somewhat different than her typical palpitations, but this has not recurred.  I reviewed her medications and she remains on stable dose of Cardizem CD with as needed short acting Cardizem 30 mg tablets.  She did undergo follow-up ischemic testing in July of last year as noted below.  ECG today shows sinus tachycardia with biatrial enlargement and nonspecific ST-T changes, pulmonary disease pattern.  Physical Exam: VS:  BP 110/70   Pulse (!) 108   Ht 5\' 2"  (1.575 m)   Wt 129 lb 12.8 oz (58.9 kg)   SpO2 100%   BMI 23.74 kg/m , BMI Body mass index is 23.74 kg/m.  Wt Readings from Last 3 Encounters:  08/08/22 129 lb 12.8 oz (58.9 kg)  07/15/22 123 lb 7.3 oz (56 kg)  07/02/22 125 lb 3.5 oz (56.8 kg)    General: Patient appears comfortable at rest.  Wearing oxygen via nasal cannula. HEENT: Conjunctiva and lids normal. Neck: Supple, no elevated JVP or carotid bruits. Lungs: Decreased breath sounds without wheezing, nonlabored breathing at rest. Cardiac: Regular rate and rhythm, no S3 or significant systolic murmur. Extremities: No pitting edema.  ECG:  An ECG dated 11/12/2021 was personally reviewed today and demonstrated:  Sinus rhythm with nonspecific ST-T  changes.  Labwork: June 2022: Cholesterol 197, triglycerides 120, HDL 76, LDL 100 02/08/2022: Magnesium 1.9 03/19/2022: TSH 1.740 07/02/2022: ALT 19; AST 19 07/16/2022: BUN 13; Creatinine, Ser 0.68; Hemoglobin 9.7; Platelets 263; Potassium 5.6; Sodium 139   Other Studies Reviewed Today:  Lexiscan Myoview 11/15/2021:   Nonspecific T wave abnormality at baseline. The ECG shows premature ventricular contractions.   The patient reported dyspnea and headaches during the stress test. Normal blood pressure and normal heart rate response noted during stress. Heart rate recovery was normal.   There were depening of the T wave inversions in the inferolateral leads with infusion compared to baseline EKG.   LV perfusion is normal. There is no evidence of ischemia. There is no evidence of infarction.   Left ventricular function is normal. Nuclear stress EF: 78 %. End diastolic cavity size is normal. End systolic cavity size is normal.   The study is normal. The study is low risk.  Assessment and Plan:  1.  PSVT.  Plan to continue current course including Cardizem CD 240 mg daily with as needed short acting 30 mg tablets.  2.  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  Continue to follow with Pulmonary.  3.  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  4.  Episode of more intense palpitations and chest pressure prior to hip surgery in March.  This has not recurred.  She did undergo reassuring ischemic testing in July of last year.  Would continue with observation  for now.  Disposition:  Follow up  3 months.  Signed, Jonelle Sidle, M.D., F.A.C.C. [facility]

## 2022-08-07 NOTE — Telephone Encounter (Signed)
PT wanted to speak w/Meagan, Dr. Juanetta Gosling nurse, about a new [place]. Pls call to advise @ [phone]

## 2022-08-07 NOTE — Addendum Note (Signed)
Addended by: Fritzi Mandes D on: 08/07/2022 11:20 AM   Modules accepted: Orders

## 2022-08-07 NOTE — Telephone Encounter (Signed)
Called patient back and she said she was referring to O2 instead of Bipap.  Will update sep. Encounter

## 2022-08-08 ENCOUNTER — Ambulatory Visit: Payer: Medicare Other | Attending: Cardiology | Admitting: Cardiology

## 2022-08-08 ENCOUNTER — Encounter: Payer: Self-pay | Admitting: Cardiology

## 2022-08-08 VITALS — BP 110/70 | HR 108 | Ht 62.0 in | Wt 129.8 lb

## 2022-08-08 DIAGNOSIS — E782 Mixed hyperlipidemia: Secondary | ICD-10-CM

## 2022-08-08 DIAGNOSIS — I471 Supraventricular tachycardia, unspecified: Secondary | ICD-10-CM | POA: Diagnosis not present

## 2022-08-08 DIAGNOSIS — J449 Chronic obstructive pulmonary disease, unspecified: Secondary | ICD-10-CM | POA: Diagnosis not present

## 2022-08-08 NOTE — Patient Instructions (Signed)
Medication Instructions:  ?Your physician recommends that you continue on your current medications as directed. Please refer to the Current Medication list given to you today. ? ? ?Labwork: ?None today ? ?Testing/Procedures: ?None today ? ?Follow-Up: ?3 months ? ?Any Other Special Instructions Will Be Listed Below (If Applicable). ? ?If you need a refill on your cardiac medications before your next appointment, please call your pharmacy. ? ?

## 2022-08-09 NOTE — Telephone Encounter (Signed)
Patient called back and states the order to [facility] was suppose to be for liquid O2 and 2 marathon containers, but they got an order for Bipap. Please advise and call patient back with update.

## 2022-08-09 NOTE — Telephone Encounter (Signed)
Called and spoke w/ Brad from [facility] to be sure they canceled the bipap order, he verified that it was canceled. I also asked him to reach out to [facility] in regards to pt wanting liquid O2 but per Elease Hashimoto they are no longer servicing that either.   ATC pt to let her know this info but was unable to reach her. LVM for her to call office back Monday.

## 2022-08-12 NOTE — Telephone Encounter (Signed)
Called and spoke w/ pt she verbalized understanding.   PCC's do any of you know a DME that is still offering liquid O2?   She has been told no from [place] and [facility] informed me Friday they were no longer doing that.

## 2022-08-13 NOTE — Telephone Encounter (Signed)
I do not know right off hand who still offers liquid o2. I can call around and see if anyone does,

## 2022-08-29 DIAGNOSIS — S72062D Displaced articular fracture of head of left femur, subsequent encounter for closed fracture with routine healing: Secondary | ICD-10-CM | POA: Diagnosis not present

## 2022-09-04 DIAGNOSIS — M6281 Muscle weakness (generalized): Secondary | ICD-10-CM | POA: Diagnosis not present

## 2022-09-04 DIAGNOSIS — R2689 Other abnormalities of gait and mobility: Secondary | ICD-10-CM | POA: Diagnosis not present

## 2022-09-04 DIAGNOSIS — M25652 Stiffness of left hip, not elsewhere classified: Secondary | ICD-10-CM | POA: Diagnosis not present

## 2022-09-04 DIAGNOSIS — M25552 Pain in left hip: Secondary | ICD-10-CM | POA: Diagnosis not present

## 2022-09-09 DIAGNOSIS — M25652 Stiffness of left hip, not elsewhere classified: Secondary | ICD-10-CM | POA: Diagnosis not present

## 2022-09-09 DIAGNOSIS — R2689 Other abnormalities of gait and mobility: Secondary | ICD-10-CM | POA: Diagnosis not present

## 2022-09-09 DIAGNOSIS — M25552 Pain in left hip: Secondary | ICD-10-CM | POA: Diagnosis not present

## 2022-09-09 DIAGNOSIS — M6281 Muscle weakness (generalized): Secondary | ICD-10-CM | POA: Diagnosis not present

## 2022-09-11 DIAGNOSIS — M25652 Stiffness of left hip, not elsewhere classified: Secondary | ICD-10-CM | POA: Diagnosis not present

## 2022-09-11 DIAGNOSIS — M25552 Pain in left hip: Secondary | ICD-10-CM | POA: Diagnosis not present

## 2022-09-11 DIAGNOSIS — R2689 Other abnormalities of gait and mobility: Secondary | ICD-10-CM | POA: Diagnosis not present

## 2022-09-11 DIAGNOSIS — M6281 Muscle weakness (generalized): Secondary | ICD-10-CM | POA: Diagnosis not present

## 2022-09-16 DIAGNOSIS — M25552 Pain in left hip: Secondary | ICD-10-CM | POA: Diagnosis not present

## 2022-09-16 DIAGNOSIS — M6281 Muscle weakness (generalized): Secondary | ICD-10-CM | POA: Diagnosis not present

## 2022-09-16 DIAGNOSIS — M25652 Stiffness of left hip, not elsewhere classified: Secondary | ICD-10-CM | POA: Diagnosis not present

## 2022-09-16 DIAGNOSIS — R2689 Other abnormalities of gait and mobility: Secondary | ICD-10-CM | POA: Diagnosis not present

## 2022-09-18 DIAGNOSIS — M6281 Muscle weakness (generalized): Secondary | ICD-10-CM | POA: Diagnosis not present

## 2022-09-18 DIAGNOSIS — M25552 Pain in left hip: Secondary | ICD-10-CM | POA: Diagnosis not present

## 2022-09-18 DIAGNOSIS — M25652 Stiffness of left hip, not elsewhere classified: Secondary | ICD-10-CM | POA: Diagnosis not present

## 2022-09-18 DIAGNOSIS — R2689 Other abnormalities of gait and mobility: Secondary | ICD-10-CM | POA: Diagnosis not present

## 2022-09-25 DIAGNOSIS — M6281 Muscle weakness (generalized): Secondary | ICD-10-CM | POA: Diagnosis not present

## 2022-09-25 DIAGNOSIS — R2689 Other abnormalities of gait and mobility: Secondary | ICD-10-CM | POA: Diagnosis not present

## 2022-09-25 DIAGNOSIS — M25652 Stiffness of left hip, not elsewhere classified: Secondary | ICD-10-CM | POA: Diagnosis not present

## 2022-09-25 DIAGNOSIS — M25552 Pain in left hip: Secondary | ICD-10-CM | POA: Diagnosis not present

## 2022-10-01 ENCOUNTER — Other Ambulatory Visit: Payer: Self-pay

## 2022-10-01 DIAGNOSIS — A0472 Enterocolitis due to Clostridium difficile, not specified as recurrent: Secondary | ICD-10-CM

## 2022-10-03 ENCOUNTER — Ambulatory Visit (INDEPENDENT_AMBULATORY_CARE_PROVIDER_SITE_OTHER): Payer: Medicare Other | Admitting: Pulmonary Disease

## 2022-10-03 ENCOUNTER — Ambulatory Visit (HOSPITAL_COMMUNITY)
Admission: RE | Admit: 2022-10-03 | Discharge: 2022-10-03 | Disposition: A | Discharge status: Home / Self Care | Payer: Medicare Other | Source: Ambulatory Visit | Attending: Pulmonary Disease | Admitting: Pulmonary Disease

## 2022-10-03 ENCOUNTER — Encounter: Payer: Self-pay | Admitting: Pulmonary Disease

## 2022-10-03 VITALS — BP 119/66 | HR 101 | Ht 62.0 in | Wt 131.4 lb

## 2022-10-03 DIAGNOSIS — G4733 Obstructive sleep apnea (adult) (pediatric): Secondary | ICD-10-CM

## 2022-10-03 DIAGNOSIS — J432 Centrilobular emphysema: Secondary | ICD-10-CM | POA: Diagnosis not present

## 2022-10-03 DIAGNOSIS — R0602 Shortness of breath: Secondary | ICD-10-CM | POA: Diagnosis not present

## 2022-10-03 DIAGNOSIS — J9611 Chronic respiratory failure with hypoxia: Secondary | ICD-10-CM

## 2022-10-03 DIAGNOSIS — R0609 Other forms of dyspnea: Secondary | ICD-10-CM | POA: Insufficient documentation

## 2022-10-03 DIAGNOSIS — J9612 Chronic respiratory failure with hypercapnia: Secondary | ICD-10-CM

## 2022-10-03 DIAGNOSIS — A0472 Enterocolitis due to Clostridium difficile, not specified as recurrent: Secondary | ICD-10-CM | POA: Diagnosis not present

## 2022-10-03 DIAGNOSIS — J439 Emphysema, unspecified: Secondary | ICD-10-CM | POA: Diagnosis not present

## 2022-10-03 DIAGNOSIS — R911 Solitary pulmonary nodule: Secondary | ICD-10-CM | POA: Diagnosis not present

## 2022-10-03 NOTE — Progress Notes (Signed)
[facility]  Chief Complaint  Patient presents with   Follow-up    Pt f/u states that she has noticed a change in her breathing. She gets more SOB w/ less activity and it takes her longer to recover. Currently uses 3-4Lpm depending on activity     Constitutional:  BP 119/66   Pulse (!) 101   Ht 5\' 2"  (1.575 m)   Wt 131 lb 6.4 oz (59.6 kg)   SpO2 98% Comment: 4L continuous  BMI 24.03 kg/m   Past Medical History:  Anxiety, OA, Depression, Diverticulosis, HLD, IBS, Osteoporosis, PNA, Shingles, SVT  Past Surgical History:  Her  has a past surgical history that includes Spinal fusion; Back surgery; Colonoscopy (03/22/2011); Colonoscopy with propofol (N/A, 05/16/2017); polypectomy (05/16/2017); Cardiac catheterization (2004); Flexible sigmoidoscopy (N/A, 09/26/2017); Breast surgery; Esophagogastroduodenoscopy (egd) with propofol (N/A, 04/19/2020); Esophageal dilation (N/A, 04/19/2020); Colonoscopy with propofol (N/A, 09/12/2021); Esophagogastroduodenoscopy (egd) with propofol (N/A, 09/12/2021); biopsy (09/12/2021); Polypectomy (09/12/2021); Vulvectomy; Ostomy (N/A, 02/07/2022); Proctoscopy (N/A, 02/07/2022); Flexible sigmoidoscopy (N/A, 06/04/2022); and Total hip arthroplasty (Left, 07/15/2022).  Brief Summary:  [name] is a 67 y.o. female former smoker with COPD from emphysema and chronic respiratory failure with hypoxia.      Subjective:   She had hip surgery.  Slowly recovering with rehab.  She has noticed her breathing hasn't been the same.  Gets short of breath more easily and oxygen level drops some.  Sometimes feels discomfort in her chest.  Has sticky, white sputum when she coughs.  Had C diff after surgery and this hit her hard.  Not having leg swelling or skin rash.  Physical Exam:   Appearance - well kempt, wearing oxygen  ENMT - no sinus tenderness, no oral exudate, no LAN, Mallampati 2 airway, no stridor  Respiratory -  equal breath sounds bilaterally, no wheezing or rales  CV - s1s2 regular rate and rhythm, no murmurs  Ext - no clubbing, no edema  Skin - no rashes  Psych - normal mood and affect      Pulmonary testing:  Spirometry 12/31/11 >> FEV1 0.4 (18%), FEV1% 42  Chest Imaging:  CT chest 10/16/19 >> mod/severe centrilobular emphysema, granuloma RUL  Sleep Tests:  Bipap titration study 05/16/14 >> Bipap 15/10 cm H2O with 3 liters O2 Bipap 02/18/22 to 03/19/22 >> used on 30 of 30 nights with average 14 hrs.  Average AHI 0.7 with Bipap 14/6 cm H2O.  Cardiac Tests:  Echo 10/31/20 >> EF 55 to 60%, grade 1 DD  Social History:  She  reports that she quit smoking about 7 years ago. Her smoking use included cigarettes. She has a 40.00 pack-year smoking history. She has been exposed to tobacco smoke. She has never used smokeless tobacco. She reports current alcohol use. She reports that she does not use drugs.  Family History:  Her family history includes Asthma in her mother; Diabetes in her mother; Heart attack in her mother; Heart failure in her mother.     Assessment/Plan:   Dyspnea on exertion. - progressed since she had hip surgery - could be related to deconditioning and weight gain after surgery - will check CBC, BMET, D dimer, BNP, chest xray and PFT - might need follow up with cardiology  Severe COPD with emphysema. - continue trelegy 100 one puff daily, daliresp 500 mcg daily, singulair 10 mg qhs - prn albuterol, mucinex  Obstructive sleep apnea. - she reports compliance with Bipap and benefit from therapy -  uses [employer] as DME for Bipap - continue Bipap 14/6 cm H2O  REM Sleep Behavior disorder. - clonazepam 0.5 mg qhs prn  Chronic respiratory failure with hypoxia. - uses 3 to 7 liters oxygen depending on activity level - she feels symptomatic if her SpO2 goes below 95% - uses [facility] as DME for oxygen set up  Allergic rhinitis. - continue singulair - prn  azelastine, nasal irrigation, zyrtec, flonase  Time Spent Involved in Patient Care on Day of Examination:  37 minutes  Follow up:   Patient Instructions  Lab tests and chest xray today  Will schedule pulmonary function test  Follow up in 8 weeks  Medication List:   Allergies as of 10/03/2022       Reactions   Iohexol Anaphylaxis, Other (See Comments)    Desc: IV DYE  ANAPHYLATIC SHOCK X2 ONCE AFTER PREMEDS   Benadryl [diphenhydramine]    Makes her stay up instead of drowsy   Ketek [telithromycin] Other (See Comments)   Loopy, eyes went different directions   Penicillins Other (See Comments)   Unknown Has patient had a PCN reaction causing immediate rash, facial/tongue/throat swelling, SOB or lightheadedness with hypotension: Unknown Has patient had a PCN reaction causing severe rash involving mucus membranes or skin necrosis: Unknown Has patient had a PCN reaction that required hospitalization: Unknown Has patient had a PCN reaction occurring within the last 10 years: No If all of the above answers are "NO", then may proceed with Cephalosporin use.   Adhesive [tape] Itching, Rash   Break out   Chantix [varenicline] Palpitations        Medication List        Accurate as of Oct 03, 2022 11:41 AM. If you have any questions, ask your nurse or doctor.          albuterol 108 (90 Base) MCG/ACT inhaler Commonly known as: VENTOLIN HFA Inhale 1-2 puffs into the lungs every 6 (six) hours as needed for wheezing or shortness of breath.   ALPRAZolam 0.25 MG tablet Commonly known as: XANAX Take 0.25 mg by mouth 3 (three) times daily as needed for anxiety.   atorvastatin 40 MG tablet Commonly known as: LIPITOR TAKE 1 TABLET DAILY   azelastine 0.1 % nasal spray Commonly known as: ASTELIN Place 1 spray into both nostrils daily as needed for rhinitis or allergies. Use in each nostril as directed   carboxymethylcellulose 1 % ophthalmic solution Place 2 drops into both  eyes 3 (three) times daily as needed (for dry eyes).   cetirizine 10 MG tablet Commonly known as: ZYRTEC Take 10 mg by mouth daily.   clonazePAM 0.5 MG tablet Commonly known as: KLONOPIN Take 1 tablet (0.5 mg total) by mouth at bedtime. What changed:  when to take this reasons to take this   diclofenac Sodium 1 % Gel Commonly known as: VOLTAREN Apply 2 g topically 2 (two) times daily as needed (pain).   diltiazem 240 MG 24 hr capsule Commonly known as: DILACOR XR Take 240 mg by mouth daily. What changed: Another medication with the same name was changed. Make sure you understand how and when to take each.   diltiazem 30 MG tablet Commonly known as: Cardizem Take 1 tablet (30 mg total) by mouth 3 (three) times daily. Take 1-2 tablets every 6 hours as needed for persistent tachycardai What changed:  when to take this reasons to take this additional instructions   escitalopram 20 MG tablet Commonly known as: LEXAPRO Take 20  mg by mouth daily.   hydrOXYzine 25 MG tablet Commonly known as: ATARAX Take 0.5 tablets (12.5 mg total) by mouth every 6 (six) hours as needed for itching.   ibuprofen 200 MG tablet Commonly known as: ADVIL Take 400 mg by mouth every 6 (six) hours as needed for moderate pain.   methocarbamol 500 MG tablet Commonly known as: ROBAXIN Take 500-1,000 mg by mouth every 6 (six) hours as needed for muscle spasms.   montelukast 10 MG tablet Commonly known as: SINGULAIR Take 1 tablet (10 mg total) by mouth at bedtime.   NON FORMULARY Pt uses a bipap with 4L of oxygen when laying down   oxybutynin 5 MG tablet Commonly known as: DITROPAN Take 5 mg by mouth daily.   OXYGEN Inhale 3.5-7 L into the lungs continuous.   promethazine 12.5 MG tablet Commonly known as: PHENERGAN Take 12.5 mg by mouth every 6 (six) hours as needed for vomiting or nausea.   roflumilast 500 MCG Tabs tablet Commonly known as: DALIRESP Take 500 mcg by mouth daily.    traZODone 50 MG tablet Commonly known as: DESYREL Take 50 mg by mouth at bedtime as needed for sleep.   Trelegy Ellipta 100-62.5-25 MCG/ACT Aepb Generic drug: Fluticasone-Umeclidin-Vilant Inhale 1 puff into the lungs daily.   valACYclovir 500 MG tablet Commonly known as: VALTREX Take 500 mg by mouth daily.        Signature:  Coralyn Helling, MD [facility] Pulmonary/Critical Care Pager - [phone] 10/03/2022, 11:41 AM

## 2022-10-03 NOTE — Patient Instructions (Signed)
Lab tests and chest xray today  Will schedule pulmonary function test  Follow up in 8 weeks

## 2022-10-05 LAB — CBC
Hematocrit: 37.1 % (ref 34.0–46.6)
Hemoglobin: 12.1 g/dL (ref 11.1–15.9)
MCH: 30.1 pg (ref 26.6–33.0)
MCHC: 32.6 g/dL (ref 31.5–35.7)
MCV: 92 fL (ref 79–97)
Platelets: 366 10*3/uL (ref 150–450)
RBC: 4.02 x10E6/uL (ref 3.77–5.28)
RDW: 13 % (ref 11.7–15.4)
WBC: 10.1 10*3/uL (ref 3.4–10.8)

## 2022-10-05 LAB — BASIC METABOLIC PANEL
BUN/Creatinine Ratio: 18 (ref 12–28)
BUN: 12 mg/dL (ref 8–27)
CO2: 30 mmol/L — ABNORMAL HIGH (ref 20–29)
Calcium: 9.3 mg/dL (ref 8.7–10.3)
Chloride: 97 mmol/L (ref 96–106)
Creatinine, Ser: 0.68 mg/dL (ref 0.57–1.00)
Glucose: 118 mg/dL — ABNORMAL HIGH (ref 70–99)
Potassium: 4.4 mmol/L (ref 3.5–5.2)
Sodium: 142 mmol/L (ref 134–144)
eGFR: 97 mL/min/{1.73_m2} (ref >59)

## 2022-10-05 LAB — BRAIN NATRIURETIC PEPTIDE: BNP: 7.9 pg/mL (ref 0.0–100.0)

## 2022-10-05 LAB — D-DIMER, QUANTITATIVE: D-DIMER: 0.78 mg/L FEU — ABNORMAL HIGH (ref 0.00–0.49)

## 2022-10-06 LAB — CLOSTRIDIUM DIFFICILE EIA: C difficile Toxins A+B, EIA: NEGATIVE

## 2022-10-07 ENCOUNTER — Other Ambulatory Visit: Payer: Self-pay | Admitting: Pulmonary Disease

## 2022-10-07 ENCOUNTER — Telehealth: Payer: Self-pay | Admitting: Pulmonary Disease

## 2022-10-07 ENCOUNTER — Other Ambulatory Visit: Payer: Self-pay

## 2022-10-07 DIAGNOSIS — R0609 Other forms of dyspnea: Secondary | ICD-10-CM

## 2022-10-07 DIAGNOSIS — M25552 Pain in left hip: Secondary | ICD-10-CM | POA: Diagnosis not present

## 2022-10-07 DIAGNOSIS — R2689 Other abnormalities of gait and mobility: Secondary | ICD-10-CM | POA: Diagnosis not present

## 2022-10-07 DIAGNOSIS — R7989 Other specified abnormal findings of blood chemistry: Secondary | ICD-10-CM

## 2022-10-07 DIAGNOSIS — M25652 Stiffness of left hip, not elsewhere classified: Secondary | ICD-10-CM | POA: Diagnosis not present

## 2022-10-07 DIAGNOSIS — M6281 Muscle weakness (generalized): Secondary | ICD-10-CM | POA: Diagnosis not present

## 2022-10-07 MED ORDER — DILTIAZEM HCL ER 240 MG PO CP24: 240.0000 mg | ORAL_CAPSULE | Freq: Every day | ORAL | 1 refills | Status: DC

## 2022-10-07 NOTE — Telephone Encounter (Signed)
Called and spoke w/ pts PT he is wanting to know if pt is okay to do physical therapy w/ her d-dimer being elevated. Pts next session is Wednesday (6/5) @ 11am.   Dr.Sood please advise?

## 2022-10-07 NOTE — Telephone Encounter (Signed)
    Latest Ref Rng & Units 10/03/2022   11:45 AM 07/16/2022    3:27 AM 07/02/2022    2:39 PM  CMP  Glucose 70 - 99 mg/dL 784  696  295   BUN 8 - 27 mg/dL 12  13  13    Creatinine 0.57 - 1.00 mg/dL 2.84  1.32  4.40   Sodium 134 - 144 mmol/L 142  139  138   Potassium 3.5 - 5.2 mmol/L 4.4  5.6  3.8   Chloride 96 - 106 mmol/L 97  96  98   CO2 20 - 29 mmol/L 30  33  33   Calcium 8.7 - 10.3 mg/dL 9.3  8.6  8.8   Total Protein 6.5 - 8.1 g/dL   7.0   Total Bilirubin 0.3 - 1.2 mg/dL   0.6   Alkaline Phos 38 - 126 U/L   98   AST 15 - 41 U/L   19   ALT 0 - 44 U/L   19     CBC    Component Value Date/Time   WBC 10.1 10/03/2022 1145   WBC 11.0 (H) 07/16/2022 0327   RBC 4.02 10/03/2022 1145   RBC 3.23 (L) 07/16/2022 0327   HGB 12.1 10/03/2022 1145   HCT 37.1 10/03/2022 1145   PLT 366 10/03/2022 1145   MCV 92 10/03/2022 1145   MCH 30.1 10/03/2022 1145   MCH 30.0 07/16/2022 0327   MCHC 32.6 10/03/2022 1145   MCHC 31.8 07/16/2022 0327   RDW 13.0 10/03/2022 1145   LYMPHSABS 2.4 07/02/2022 1439   LYMPHSABS 4.2 (H) 05/30/2022 1300   MONOABS 0.8 07/02/2022 1439   EOSABS 0.1 07/02/2022 1439   EOSABS 0.3 05/30/2022 1300   BASOSABS 0.0 07/02/2022 1439   BASOSABS 0.0 05/30/2022 1300    Lab Results  Component Value Date   DDIMER 0.78 (H) 10/03/2022    BNP    Component Value Date/Time   BNP 7.9 10/03/2022 1145   BNP 67.0 05/13/2016 1929    BNP (last 3 results) Recent Labs    10/03/22 1145  BNP 7.9    Results d/w pt.  She has elevated D dimer.  Will arrange for V/Q scan to assess further.

## 2022-10-07 NOTE — Telephone Encounter (Signed)
John would like to know if patient cleared to do physical therapy. Has positive D Dimer. John phone number is [phone].

## 2022-10-07 NOTE — Telephone Encounter (Signed)
Called and spoke w/ [name] made him aware of VS recommendation to hold off on [name] until the results of scan are back. He verbalized understanding and will cancel [name] appt.     Also called and spoke w/ [name] letting her know that VS wants her to put hold on therapy until scan is resulted. She verbalized understanding. NFN at time of call

## 2022-10-07 NOTE — Telephone Encounter (Signed)
I am scheduling her for a V/Q scan to assess whether she has a PE.  She should not do PT until we gets results back from V/Q scan.

## 2022-10-08 ENCOUNTER — Other Ambulatory Visit: Payer: Self-pay | Admitting: Pulmonary Disease

## 2022-10-10 ENCOUNTER — Encounter (HOSPITAL_BASED_OUTPATIENT_CLINIC_OR_DEPARTMENT_OTHER): Payer: Self-pay | Admitting: Pulmonary Disease

## 2022-10-10 ENCOUNTER — Other Ambulatory Visit: Payer: Self-pay | Admitting: Pulmonary Disease

## 2022-10-10 DIAGNOSIS — R0609 Other forms of dyspnea: Secondary | ICD-10-CM

## 2022-10-10 DIAGNOSIS — S72062D Displaced articular fracture of head of left femur, subsequent encounter for closed fracture with routine healing: Secondary | ICD-10-CM | POA: Diagnosis not present

## 2022-10-10 DIAGNOSIS — R7989 Other specified abnormal findings of blood chemistry: Secondary | ICD-10-CM

## 2022-10-10 NOTE — Telephone Encounter (Signed)
Patient scheduled for VQ scan on 6/11   They have her do a chest x ray that same day if you all could place that order as well.   Thanks

## 2022-10-12 ENCOUNTER — Encounter: Payer: Self-pay | Admitting: Cardiology

## 2022-10-14 ENCOUNTER — Encounter (HOSPITAL_COMMUNITY)
Admission: RE | Admit: 2022-10-14 | Discharge: 2022-10-14 | Disposition: A | Discharge status: Home / Self Care | Payer: Medicare Other | Source: Ambulatory Visit | Attending: Pulmonary Disease | Admitting: Pulmonary Disease

## 2022-10-14 ENCOUNTER — Ambulatory Visit (HOSPITAL_COMMUNITY)
Admission: RE | Admit: 2022-10-14 | Discharge: 2022-10-14 | Disposition: A | Discharge status: Home / Self Care | Payer: Medicare Other | Source: Ambulatory Visit | Attending: Pulmonary Disease | Admitting: Pulmonary Disease

## 2022-10-14 ENCOUNTER — Other Ambulatory Visit: Payer: Self-pay | Admitting: Pulmonary Disease

## 2022-10-14 DIAGNOSIS — R0609 Other forms of dyspnea: Secondary | ICD-10-CM | POA: Insufficient documentation

## 2022-10-14 DIAGNOSIS — R7989 Other specified abnormal findings of blood chemistry: Secondary | ICD-10-CM | POA: Insufficient documentation

## 2022-10-14 DIAGNOSIS — R0902 Hypoxemia: Secondary | ICD-10-CM | POA: Diagnosis not present

## 2022-10-14 DIAGNOSIS — R0602 Shortness of breath: Secondary | ICD-10-CM | POA: Diagnosis not present

## 2022-10-14 DIAGNOSIS — R053 Chronic cough: Secondary | ICD-10-CM | POA: Diagnosis not present

## 2022-10-14 MED ORDER — TECHNETIUM TO 99M ALBUMIN AGGREGATED
4.0000 | Freq: Once | INTRAVENOUS | Status: AC | PRN
  Administered 2022-10-14: 4 via INTRAVENOUS

## 2022-10-15 ENCOUNTER — Ambulatory Visit (HOSPITAL_COMMUNITY): Payer: Medicare Other

## 2022-10-21 ENCOUNTER — Telehealth: Payer: Self-pay | Admitting: Pulmonary Disease

## 2022-10-21 DIAGNOSIS — R7989 Other specified abnormal findings of blood chemistry: Secondary | ICD-10-CM

## 2022-10-21 DIAGNOSIS — Z9289 Personal history of other medical treatment: Secondary | ICD-10-CM

## 2022-10-21 DIAGNOSIS — R0609 Other forms of dyspnea: Secondary | ICD-10-CM

## 2022-10-21 NOTE — Telephone Encounter (Signed)
CLINICAL DATA:  COPD, hypoxia, positive D-dimer.   EXAM: NUCLEAR MEDICINE PERFUSION LUNG SCAN   TECHNIQUE: Perfusion images were obtained in multiple projections after intravenous injection of radiopharmaceutical.   Ventilation scans intentionally deferred if perfusion scan and chest x-ray adequate for interpretation during COVID 19 epidemic.   RADIOPHARMACEUTICALS:  4  mCi Tc-18m MAA IV   COMPARISON:  CT 10/15/2019 and chest x-ray 10/14/2022   FINDINGS: Diminished perfusion identified in the lung apices and right mid lung which corresponds to significant emphysematous scarring and would likely demonstrate air trapping if ventilation images were obtained. The lack of complete study limits the ability to determine the presence of PE. CT angiogram should be performed if possible for further evaluation.   IMPRESSION: Indeterminate study with respect to the presence of PE. Consider CT angiography for further evaluation.     Electronically Signed   By: Layla Maw M.D.   On: 10/20/2022 15:24    Results d/w pt.  She continues to have trouble with her breathing that is worse compared to a couple months ago.  She has positive D dimer.  Will arrange for CT angio chest.  She had reported history of anaphylaxis to IV contrast dye, but she says this listing is inaccurate.  She says she gets nauseous sometimes after getting IV contrast, but no other reactions.  Allergy listing adjusted.

## 2022-10-28 DIAGNOSIS — M65342 Trigger finger, left ring finger: Secondary | ICD-10-CM | POA: Diagnosis not present

## 2022-10-28 DIAGNOSIS — M65331 Trigger finger, right middle finger: Secondary | ICD-10-CM | POA: Diagnosis not present

## 2022-10-28 DIAGNOSIS — M65332 Trigger finger, left middle finger: Secondary | ICD-10-CM | POA: Diagnosis not present

## 2022-10-29 ENCOUNTER — Ambulatory Visit (HOSPITAL_COMMUNITY): Payer: Medicare Other

## 2022-10-29 ENCOUNTER — Telehealth: Payer: Self-pay | Admitting: *Deleted

## 2022-10-29 DIAGNOSIS — R0609 Other forms of dyspnea: Secondary | ICD-10-CM

## 2022-10-29 DIAGNOSIS — R7989 Other specified abnormal findings of blood chemistry: Secondary | ICD-10-CM

## 2022-10-29 NOTE — Telephone Encounter (Signed)
Coralyn Helling, MD      10/21/22  5:02 PM Note CLINICAL DATA:  COPD, hypoxia, positive D-dimer.   EXAM: NUCLEAR MEDICINE PERFUSION LUNG SCAN   TECHNIQUE: Perfusion images were obtained in multiple projections after intravenous injection of radiopharmaceutical.   Ventilation scans intentionally deferred if perfusion scan and chest x-ray adequate for interpretation during COVID 19 epidemic.   RADIOPHARMACEUTICALS:  4  mCi Tc-34m MAA IV   COMPARISON:  CT 10/15/2019 and chest x-ray 10/14/2022   FINDINGS: Diminished perfusion identified in the lung apices and right mid lung which corresponds to significant emphysematous scarring and would likely demonstrate air trapping if ventilation images were obtained. The lack of complete study limits the ability to determine the presence of PE. CT angiogram should be performed if possible for further evaluation.   IMPRESSION: Indeterminate study with respect to the presence of PE. Consider CT angiography for further evaluation.     Electronically Signed   By: Layla Maw M.D.   On: 10/20/2022 15:24       Results d/w pt.  She continues to have trouble with her breathing that is worse compared to a couple months ago.  She has positive D dimer.  Will arrange for CT angio chest.   She had reported history of anaphylaxis to IV contrast dye, but she says this listing is inaccurate.  She says she gets nauseous sometimes after getting IV contrast, but no other reactions.  Allergy listing adjusted.       [facility] Cordelia Pen), asking if she should go ahead and schedule the CTA.  I advised her that I would call the patient.  I called her home and cell # and left a VM on both numbers.  [name] called and discussed the VQ scan results and discussed doing the CTA.  I advised Cordelia Pen to go ahead and get it scheduled.  Will await patient's return call to let her know to listen out for a call from our [facility] to get time/date of CTA.

## 2022-10-31 ENCOUNTER — Ambulatory Visit (HOSPITAL_COMMUNITY)
Admission: RE | Admit: 2022-10-31 | Discharge: 2022-10-31 | Disposition: A | Discharge status: Home / Self Care | Payer: Medicare Other | Source: Ambulatory Visit | Attending: Pulmonary Disease | Admitting: Pulmonary Disease

## 2022-10-31 DIAGNOSIS — R7989 Other specified abnormal findings of blood chemistry: Secondary | ICD-10-CM | POA: Diagnosis not present

## 2022-10-31 DIAGNOSIS — R0609 Other forms of dyspnea: Secondary | ICD-10-CM | POA: Insufficient documentation

## 2022-10-31 DIAGNOSIS — I2510 Atherosclerotic heart disease of native coronary artery without angina pectoris: Secondary | ICD-10-CM | POA: Diagnosis not present

## 2022-10-31 DIAGNOSIS — J432 Centrilobular emphysema: Secondary | ICD-10-CM | POA: Diagnosis not present

## 2022-10-31 MED ORDER — IOHEXOL 350 MG/ML SOLN
80.0000 mL | Freq: Once | INTRAVENOUS | Status: AC | PRN
  Administered 2022-10-31: 75 mL via INTRAVENOUS

## 2022-11-22 ENCOUNTER — Encounter (HOSPITAL_BASED_OUTPATIENT_CLINIC_OR_DEPARTMENT_OTHER): Payer: Medicare Other

## 2022-11-22 ENCOUNTER — Ambulatory Visit (HOSPITAL_BASED_OUTPATIENT_CLINIC_OR_DEPARTMENT_OTHER): Payer: Medicare Other | Admitting: Pulmonary Disease

## 2022-11-25 ENCOUNTER — Encounter (HOSPITAL_BASED_OUTPATIENT_CLINIC_OR_DEPARTMENT_OTHER): Payer: Self-pay | Admitting: Pulmonary Disease

## 2022-11-25 ENCOUNTER — Ambulatory Visit (INDEPENDENT_AMBULATORY_CARE_PROVIDER_SITE_OTHER): Payer: Medicare Other | Admitting: Pulmonary Disease

## 2022-11-25 ENCOUNTER — Telehealth (INDEPENDENT_AMBULATORY_CARE_PROVIDER_SITE_OTHER): Payer: Medicare Other | Admitting: Pulmonary Disease

## 2022-11-25 ENCOUNTER — Encounter (HOSPITAL_BASED_OUTPATIENT_CLINIC_OR_DEPARTMENT_OTHER): Payer: Medicare Other

## 2022-11-25 VITALS — Ht 62.0 in | Wt 131.0 lb

## 2022-11-25 DIAGNOSIS — J309 Allergic rhinitis, unspecified: Secondary | ICD-10-CM

## 2022-11-25 DIAGNOSIS — J9611 Chronic respiratory failure with hypoxia: Secondary | ICD-10-CM | POA: Diagnosis not present

## 2022-11-25 DIAGNOSIS — Z87891 Personal history of nicotine dependence: Secondary | ICD-10-CM | POA: Diagnosis not present

## 2022-11-25 DIAGNOSIS — J449 Chronic obstructive pulmonary disease, unspecified: Secondary | ICD-10-CM | POA: Diagnosis not present

## 2022-11-25 DIAGNOSIS — R0609 Other forms of dyspnea: Secondary | ICD-10-CM | POA: Diagnosis not present

## 2022-11-25 DIAGNOSIS — G4752 REM sleep behavior disorder: Secondary | ICD-10-CM

## 2022-11-25 DIAGNOSIS — G4733 Obstructive sleep apnea (adult) (pediatric): Secondary | ICD-10-CM

## 2022-11-25 MED ORDER — ALBUTEROL SULFATE (2.5 MG/3ML) 0.083% IN NEBU: 2.5000 mg | INHALATION_SOLUTION | Freq: Four times a day (QID) | RESPIRATORY_TRACT | 5 refills | Status: DC | PRN

## 2022-11-25 NOTE — Patient Instructions (Signed)
Will arrange for a home nebulizer  Follow up in 4 months

## 2022-11-25 NOTE — Progress Notes (Signed)
[facility]  Chief Complaint  Patient presents with   Follow-up    Reviewing PFT results     Constitutional:  Ht 5\' 2"  (1.575 m)   Wt 131 lb (59.4 kg)   BMI 23.96 kg/m   Past Medical History:  Anxiety, OA, Depression, Diverticulosis, HLD, IBS, Osteoporosis, PNA, Shingles, SVT  Past Surgical History:  Her  has a past surgical history that includes Spinal fusion; Back surgery; Colonoscopy (03/22/2011); Colonoscopy with propofol (N/A, 05/16/2017); polypectomy (05/16/2017); Cardiac catheterization (2004); Flexible sigmoidoscopy (N/A, 09/26/2017); Breast surgery; Esophagogastroduodenoscopy (egd) with propofol (N/A, 04/19/2020); Esophageal dilation (N/A, 04/19/2020); Colonoscopy with propofol (N/A, 09/12/2021); Esophagogastroduodenoscopy (egd) with propofol (N/A, 09/12/2021); biopsy (09/12/2021); Polypectomy (09/12/2021); Vulvectomy; Ostomy (N/A, 02/07/2022); Proctoscopy (N/A, 02/07/2022); Flexible sigmoidoscopy (N/A, 06/04/2022); and Total hip arthroplasty (Left, 07/15/2022).  Brief Summary:  [name] is a 67 y.o. female former smoker with COPD from emphysema and chronic respiratory failure with hypoxia.      Subjective:  Virtual Visit via Video Note  I connected with [name] on 11/25/22 at  2:30 PM EDT by a video enabled telemedicine application and verified that I am speaking with the correct person using two identifiers.  Location: Patient: home Provider: medical office   I discussed the limitations of evaluation and management by telemedicine and the availability of in person appointments. The patient expressed understanding and agreed to proceed.  History of Present Illness:  She had lab work that showed an elevated D-dimer.  V/Q scan was indeterminate.  CT angio chest showed stable findings of COPD with emphysema and coronary atherosclerosis.  She is feeling better more recently.  Not having much cough or chest  congestion.  Not having chest pain or leg swelling.  Doesn't use albuterol much, but doesn't feel like she gets enough into her lungs when she has to use it.  Doesn't have a nebulizer.  Physical Exam:   Speech fluent, no distress.      Pulmonary testing:  Spirometry 12/31/11 >> FEV1 0.4 (18%), FEV1% 42  Chest Imaging:  CT chest 10/16/19 >> mod/severe centrilobular emphysema, granuloma RUL V/Q scan 10/20/22 >> indeterminate CT angio chest 10/31/22 >> calcified granulomas, diffuse bronchial thickening, severe emphysema, calcified plaque LAD  Sleep Tests:  Bipap titration study 05/16/14 >> Bipap 15/10 cm H2O with 3 liters O2 Bipap 02/18/22 to 03/19/22 >> used on 30 of 30 nights with average 14 hrs.  Average AHI 0.7 with Bipap 14/6 cm H2O.  Cardiac Tests:  Echo 10/31/20 >> EF 55 to 60%, grade 1 DD  Social History:  She  reports that she quit smoking about 7 years ago. Her smoking use included cigarettes. She started smoking about 47 years ago. She has a 40 pack-year smoking history. She has been exposed to tobacco smoke. She has never used smokeless tobacco. She reports current alcohol use. She reports that she does not use drugs.  Family History:  Her family history includes Asthma in her mother; Diabetes in her mother; Heart attack in her mother; Heart failure in her mother.     Assessment/Plan:   Dyspnea on exertion. - improved since last visit - suspect from deconditioning after her recent surgery - if symptoms progress, then might need further assessment of CAD by cardiology  Severe COPD with emphysema. - continue trelegy 100 one puff daily, daliresp 500 mcg daily, singulair 10 mg qhs - prn albuterol, mucinex - will arrange for a home nebulizer machine  Obstructive sleep apnea. -  she reports compliance with Bipap and benefit from therapy - uses [place] as DME for Bipap - continue Bipap 14/6 cm H2O  REM Sleep Behavior disorder. - clonazepam 0.5 mg qhs  prn  Chronic respiratory failure with hypoxia. - uses 3 to 7 liters oxygen depending on activity level - she feels symptomatic if her SpO2 goes below 95% - uses [employer] as DME for oxygen set up  Allergic rhinitis. - continue singulair - prn azelastine, nasal irrigation, zyrtec, flonase  Time Spent Involved in Patient Care on Day of Examination:    I discussed the assessment and treatment plan with the patient. The patient was provided an opportunity to ask questions and all were answered. The patient agreed with the plan and demonstrated an understanding of the instructions.   The patient was advised to call back or seek an in-person evaluation if the symptoms worsen or if the condition fails to improve as anticipated.  I provided 27 minutes of non-face-to-face time during this encounter.  Follow up:   Patient Instructions  Will arrange for a home nebulizer  Follow up in 4 months  Medication List:   Allergies as of 11/25/2022       Reactions   Benadryl [diphenhydramine]    Makes her stay up instead of drowsy   Ketek [telithromycin] Other (See Comments)   Loopy, eyes went different directions   Penicillins Other (See Comments)   Unknown Has patient had a PCN reaction causing immediate rash, facial/tongue/throat swelling, SOB or lightheadedness with hypotension: Unknown Has patient had a PCN reaction causing severe rash involving mucus membranes or skin necrosis: Unknown Has patient had a PCN reaction that required hospitalization: Unknown Has patient had a PCN reaction occurring within the last 10 years: No If all of the above answers are "NO", then may proceed with Cephalosporin use.   Adhesive [tape] Itching, Rash   Break out   Chantix [varenicline] Palpitations   Iohexol Nausea Only           Medication List        Accurate as of November 25, 2022  2:39 PM. If you have any questions, ask your nurse or doctor.          albuterol 108 (90 Base) MCG/ACT  inhaler Commonly known as: VENTOLIN HFA Inhale 1-2 puffs into the lungs every 6 (six) hours as needed for wheezing or shortness of breath. What changed: Another medication with the same name was added. Make sure you understand how and when to take each. Changed by: Coralyn Helling   albuterol (2.5 MG/3ML) 0.083% nebulizer solution Commonly known as: PROVENTIL Take 3 mLs (2.5 mg total) by nebulization every 6 (six) hours as needed for wheezing or shortness of breath. What changed: You were already taking a medication with the same name, and this prescription was added. Make sure you understand how and when to take each. Changed by: Coralyn Helling   ALPRAZolam 0.25 MG tablet Commonly known as: XANAX Take 0.25 mg by mouth 3 (three) times daily as needed for anxiety.   atorvastatin 40 MG tablet Commonly known as: LIPITOR TAKE 1 TABLET DAILY   azelastine 0.1 % nasal spray Commonly known as: ASTELIN Place 1 spray into both nostrils daily as needed for rhinitis or allergies. Use in each nostril as directed   carboxymethylcellulose 1 % ophthalmic solution Place 2 drops into both eyes 3 (three) times daily as needed (for dry eyes).   cetirizine 10 MG tablet Commonly known as: ZYRTEC Take 10  mg by mouth daily.   clonazePAM 0.5 MG tablet Commonly known as: KLONOPIN Take 1 tablet (0.5 mg total) by mouth at bedtime. What changed:  when to take this reasons to take this   diclofenac Sodium 1 % Gel Commonly known as: VOLTAREN Apply 2 g topically 2 (two) times daily as needed (pain).   diltiazem 30 MG tablet Commonly known as: Cardizem Take 1 tablet (30 mg total) by mouth 3 (three) times daily. Take 1-2 tablets every 6 hours as needed for persistent tachycardai What changed:  when to take this reasons to take this additional instructions   diltiazem 240 MG 24 hr capsule Commonly known as: DILACOR XR Take 1 capsule (240 mg total) by mouth daily. What changed: Another medication with the  same name was changed. Make sure you understand how and when to take each.   escitalopram 20 MG tablet Commonly known as: LEXAPRO Take 20 mg by mouth daily.   hydrOXYzine 25 MG tablet Commonly known as: ATARAX Take 0.5 tablets (12.5 mg total) by mouth every 6 (six) hours as needed for itching.   ibuprofen 200 MG tablet Commonly known as: ADVIL Take 400 mg by mouth every 6 (six) hours as needed for moderate pain.   methocarbamol 500 MG tablet Commonly known as: ROBAXIN Take 500-1,000 mg by mouth every 6 (six) hours as needed for muscle spasms.   montelukast 10 MG tablet Commonly known as: SINGULAIR Take 1 tablet (10 mg total) by mouth at bedtime.   NASAL MIST IN Inhale into the lungs. Using twice a day   NON FORMULARY Pt uses a bipap with 4L of oxygen when laying down   oxybutynin 5 MG tablet Commonly known as: DITROPAN Take 5 mg by mouth daily.   OXYGEN Inhale 3.5-7 L into the lungs continuous.   promethazine 12.5 MG tablet Commonly known as: PHENERGAN Take 12.5 mg by mouth every 6 (six) hours as needed for vomiting or nausea.   roflumilast 500 MCG Tabs tablet Commonly known as: DALIRESP Take 500 mcg by mouth daily.   traZODone 50 MG tablet Commonly known as: DESYREL Take 50 mg by mouth at bedtime as needed for sleep.   Trelegy Ellipta 100-62.5-25 MCG/ACT Aepb Generic drug: Fluticasone-Umeclidin-Vilant Inhale 1 puff into the lungs daily.   valACYclovir 500 MG tablet Commonly known as: VALTREX Take 500 mg by mouth daily.        Signature:  Coralyn Helling, MD [facility]Critical [facility] Pager - [phone] 11/25/2022, 2:39 PM

## 2022-11-25 NOTE — Patient Instructions (Signed)
Full PFT performed today. °

## 2022-11-25 NOTE — Progress Notes (Signed)
Full PFT performed today. °

## 2022-11-27 ENCOUNTER — Encounter (HOSPITAL_BASED_OUTPATIENT_CLINIC_OR_DEPARTMENT_OTHER): Payer: Self-pay | Admitting: Pulmonary Disease

## 2022-11-27 LAB — PULMONARY FUNCTION TEST
DL/VA: 1.85 ml/min/mmHg/L
DLCO cor % pred: 23 %
DLCO cor: 4.59 ml/min/mmHg
DLCO unc % pred: 22 %
DLCO unc: 4.39 ml/min/mmHg
FEF 25-75 Pre: 0.19 L/sec
FEF2575-%Change-Post: 8 %
FEF2575-%Pred-Post: 9 %
FEF2575-%Pred-Pre: 9 %
FEV1-%Pred-Post: 21 %
FEV1-%Pred-Pre: 20 %
FEV1-Post: 0.5 L
FEV1-Pre: 0.47 L
FEV1FVC-%Change-Post: 1 %
FEV1FVC-%Pred-Pre: 59 %
FEV6-%Change-Post: 4 %
FEV6-%Pred-Post: 35 %
FEV6-%Pred-Pre: 34 %
FEV6-Post: 1.05 L
FEV6-Pre: 1 L
FEV6FVC-%Pred-Post: 102 %
FEV6FVC-%Pred-Pre: 101 %
FVC-%Change-Post: 3 %
FVC-%Pred-Post: 35 %
FVC-%Pred-Pre: 33 %
FVC-Post: 1.07 L
Post FEV1/FVC ratio: 47 %
Pre FEV1/FVC ratio: 46 %
Pre FEV6/FVC Ratio: 97 %
RV % pred: 188 %
RV: 3.84 L
TLC % pred: 101 %
TLC: 4.97 L

## 2022-12-11 ENCOUNTER — Ambulatory Visit: Payer: Medicare Other | Admitting: Cardiology

## 2022-12-11 NOTE — Progress Notes (Deleted)
Cardiology Office Note  Date: 12/11/2022   ID: OSCEOLA WEVER, DOB 05/18/1955, MRN [id number]  History of Present Illness: [name] is a 67 y.o. female last seen in April.  Physical Exam: VS:  There were no vitals taken for this visit., BMI There is no height or weight on file to calculate BMI.  Wt Readings from Last 3 Encounters:  11/25/22 131 lb (59.4 kg)  10/03/22 131 lb 6.4 oz (59.6 kg)  08/08/22 129 lb 12.8 oz (58.9 kg)    General: Patient appears comfortable at rest. HEENT: Conjunctiva and lids normal, oropharynx clear with moist mucosa. Neck: Supple, no elevated JVP or carotid bruits, no thyromegaly. Lungs: Clear to auscultation, nonlabored breathing at rest. Cardiac: Regular rate and rhythm, no S3 or significant systolic murmur, no pericardial rub. Abdomen: Soft, nontender, no hepatomegaly, bowel sounds present, no guarding or rebound. Extremities: No pitting edema, distal pulses 2+. Skin: Warm and dry. Musculoskeletal: No kyphosis. Neuropsychiatric: Alert and oriented x3, affect grossly appropriate.  ECG:  An ECG dated 08/08/2022 was personally reviewed today and demonstrated:  Sinus tachycardia with biatrial enlargement and nonspecific ST-T changes, pulmonary disease pattern.  Labwork: 02/08/2022: Magnesium 1.9 03/19/2022: TSH 1.740 07/02/2022: ALT 19; AST 19 10/03/2022: BNP 7.9; BUN 12; Creatinine, Ser 0.68; Hemoglobin 12.1; Platelets 366; Potassium 4.4; Sodium 142   Other Studies Reviewed Today:  Lexiscan Myoview 11/15/2021:   Nonspecific T wave abnormality at baseline. The ECG shows premature ventricular contractions.   The patient reported dyspnea and headaches during the stress test. Normal blood pressure and normal heart rate response noted during stress. Heart rate recovery was normal.   There were depening of the T wave inversions in the inferolateral leads with infusion compared to baseline EKG.   LV perfusion is normal. There is no evidence of  ischemia. There is no evidence of infarction.   Left ventricular function is normal. Nuclear stress EF: 78 %. End diastolic cavity size is normal. End systolic cavity size is normal.   The study is normal. The study is low risk.  Assessment and Plan:  1.  PSVT.   2.  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  She had follow-up with [name] in July, I reviewed the note.   3.  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  Disposition:  Follow up {follow up:15908}  Signed, Jonelle Sidle, M.D., F.A.C.C. [facility], [facility], [facility]

## 2022-12-31 DIAGNOSIS — F900 Attention-deficit hyperactivity disorder, predominantly inattentive type: Secondary | ICD-10-CM | POA: Diagnosis not present

## 2023-02-19 ENCOUNTER — Telehealth: Payer: Self-pay | Admitting: Acute Care

## 2023-02-19 ENCOUNTER — Telehealth: Payer: Self-pay | Admitting: Pulmonary Disease

## 2023-02-19 NOTE — Telephone Encounter (Signed)
I have called the patient . She has been sick x 2 weeks . She is currently on 4 L [place], and she is desaturating into the 70's with minimal exertion, which is not her baseline. She has had fever. We do not have any open visits today. Patient needs CXR and lab work . I suspect this is a COPD flare but  she has also noted that her urine is very cloudy. I am concerned she could also have a UTI which may be contributing to her symptoms. It could also  be a PE.  I have asked her to seek emergency care and proceed to the nearest ED. She verbalized understanding and said she would seek emergency care.

## 2023-02-19 NOTE — Telephone Encounter (Signed)
Patient states having symptoms of shortness of breath and runny nose. Pharmacy is [facility]. Patient phone number is [phone].

## 2023-02-19 NOTE — Telephone Encounter (Signed)
Called and spoke with the pt  She states that she has been feeling bad for approx 2 wks now PND, dry cough, clear nasal d/c, wheezing, increased DOE, palpitations off and on She had fever when symptoms first started and took at home covid test that was neg   She states that her sats have been dropping into the 70's with exertion on 4lpm  I advised that she needs to seek emergent care She wanted msg sent to provider of the day to see if anything can be handled telephonically  Routing to provider of the day

## 2023-02-20 ENCOUNTER — Inpatient Hospital Stay (HOSPITAL_COMMUNITY)
Admission: EM | Admit: 2023-02-20 | Discharge: 2023-02-24 | DRG: 190 | Disposition: A | Discharge status: Home / Self Care | Payer: Medicare Other | Attending: Internal Medicine | Admitting: Internal Medicine

## 2023-02-20 ENCOUNTER — Emergency Department (HOSPITAL_COMMUNITY): Payer: Medicare Other

## 2023-02-20 ENCOUNTER — Other Ambulatory Visit: Payer: Self-pay

## 2023-02-20 ENCOUNTER — Encounter (HOSPITAL_COMMUNITY): Payer: Self-pay | Admitting: Emergency Medicine

## 2023-02-20 DIAGNOSIS — Z79899 Other long term (current) drug therapy: Secondary | ICD-10-CM

## 2023-02-20 DIAGNOSIS — R7302 Impaired glucose tolerance (oral): Secondary | ICD-10-CM | POA: Diagnosis present

## 2023-02-20 DIAGNOSIS — Z981 Arthrodesis status: Secondary | ICD-10-CM | POA: Diagnosis not present

## 2023-02-20 DIAGNOSIS — M810 Age-related osteoporosis without current pathological fracture: Secondary | ICD-10-CM | POA: Diagnosis present

## 2023-02-20 DIAGNOSIS — F419 Anxiety disorder, unspecified: Secondary | ICD-10-CM | POA: Diagnosis present

## 2023-02-20 DIAGNOSIS — Z825 Family history of asthma and other chronic lower respiratory diseases: Secondary | ICD-10-CM

## 2023-02-20 DIAGNOSIS — G4733 Obstructive sleep apnea (adult) (pediatric): Secondary | ICD-10-CM | POA: Diagnosis present

## 2023-02-20 DIAGNOSIS — Z8249 Family history of ischemic heart disease and other diseases of the circulatory system: Secondary | ICD-10-CM | POA: Diagnosis not present

## 2023-02-20 DIAGNOSIS — R0609 Other forms of dyspnea: Secondary | ICD-10-CM | POA: Diagnosis not present

## 2023-02-20 DIAGNOSIS — Z1152 Encounter for screening for COVID-19: Secondary | ICD-10-CM | POA: Diagnosis not present

## 2023-02-20 DIAGNOSIS — Z888 Allergy status to other drugs, medicaments and biological substances status: Secondary | ICD-10-CM

## 2023-02-20 DIAGNOSIS — I4719 Other supraventricular tachycardia: Secondary | ICD-10-CM | POA: Diagnosis present

## 2023-02-20 DIAGNOSIS — J441 Chronic obstructive pulmonary disease with (acute) exacerbation: Secondary | ICD-10-CM | POA: Diagnosis not present

## 2023-02-20 DIAGNOSIS — Z96642 Presence of left artificial hip joint: Secondary | ICD-10-CM | POA: Diagnosis present

## 2023-02-20 DIAGNOSIS — J9621 Acute and chronic respiratory failure with hypoxia: Secondary | ICD-10-CM | POA: Diagnosis not present

## 2023-02-20 DIAGNOSIS — N3000 Acute cystitis without hematuria: Secondary | ICD-10-CM

## 2023-02-20 DIAGNOSIS — Z880 Allergy status to penicillin: Secondary | ICD-10-CM

## 2023-02-20 DIAGNOSIS — B9620 Unspecified Escherichia coli [E. coli] as the cause of diseases classified elsewhere: Secondary | ICD-10-CM | POA: Diagnosis present

## 2023-02-20 DIAGNOSIS — F32A Depression, unspecified: Secondary | ICD-10-CM | POA: Diagnosis present

## 2023-02-20 DIAGNOSIS — R0602 Shortness of breath: Secondary | ICD-10-CM | POA: Diagnosis not present

## 2023-02-20 DIAGNOSIS — Z91048 Other nonmedicinal substance allergy status: Secondary | ICD-10-CM

## 2023-02-20 DIAGNOSIS — E785 Hyperlipidemia, unspecified: Secondary | ICD-10-CM | POA: Diagnosis present

## 2023-02-20 DIAGNOSIS — K219 Gastro-esophageal reflux disease without esophagitis: Secondary | ICD-10-CM | POA: Diagnosis present

## 2023-02-20 DIAGNOSIS — N390 Urinary tract infection, site not specified: Secondary | ICD-10-CM | POA: Diagnosis present

## 2023-02-20 DIAGNOSIS — G709 Myoneural disorder, unspecified: Secondary | ICD-10-CM | POA: Diagnosis present

## 2023-02-20 DIAGNOSIS — R7303 Prediabetes: Secondary | ICD-10-CM | POA: Diagnosis present

## 2023-02-20 DIAGNOSIS — Z8701 Personal history of pneumonia (recurrent): Secondary | ICD-10-CM

## 2023-02-20 DIAGNOSIS — Z833 Family history of diabetes mellitus: Secondary | ICD-10-CM

## 2023-02-20 DIAGNOSIS — Z87891 Personal history of nicotine dependence: Secondary | ICD-10-CM

## 2023-02-20 DIAGNOSIS — Z66 Do not resuscitate: Secondary | ICD-10-CM | POA: Diagnosis present

## 2023-02-20 DIAGNOSIS — M1990 Unspecified osteoarthritis, unspecified site: Secondary | ICD-10-CM | POA: Diagnosis present

## 2023-02-20 DIAGNOSIS — Z23 Encounter for immunization: Secondary | ICD-10-CM

## 2023-02-20 DIAGNOSIS — J449 Chronic obstructive pulmonary disease, unspecified: Secondary | ICD-10-CM | POA: Diagnosis not present

## 2023-02-20 DIAGNOSIS — I10 Essential (primary) hypertension: Secondary | ICD-10-CM | POA: Diagnosis present

## 2023-02-20 LAB — CBC WITH DIFFERENTIAL/PLATELET
Abs Immature Granulocytes: 0.03 10*3/uL (ref 0.00–0.07)
Basophils Absolute: 0 10*3/uL (ref 0.0–0.1)
Basophils Relative: 0 %
Eosinophils Absolute: 0.2 10*3/uL (ref 0.0–0.5)
Eosinophils Relative: 3 %
HCT: 39 % (ref 36.0–46.0)
Hemoglobin: 12.9 g/dL (ref 12.0–15.0)
Immature Granulocytes: 0 %
Lymphocytes Relative: 22 %
Lymphs Abs: 2.1 10*3/uL (ref 0.7–4.0)
MCH: 32 pg (ref 26.0–34.0)
MCHC: 33.1 g/dL (ref 30.0–36.0)
MCV: 96.8 fL (ref 80.0–100.0)
Monocytes Absolute: 0.7 10*3/uL (ref 0.1–1.0)
Monocytes Relative: 8 %
Neutro Abs: 6.3 10*3/uL (ref 1.7–7.7)
Neutrophils Relative %: 67 %
Platelets: 310 10*3/uL (ref 150–400)
RBC: 4.03 MIL/uL (ref 3.87–5.11)
RDW: 12.3 % (ref 11.5–15.5)
WBC: 9.4 10*3/uL (ref 4.0–10.5)
nRBC: 0 % (ref 0.0–0.2)

## 2023-02-20 LAB — TROPONIN I (HIGH SENSITIVITY)
Troponin I (High Sensitivity): <2 ng/L (ref <18)
Troponin I (High Sensitivity): 2 ng/L (ref <18)

## 2023-02-20 LAB — BASIC METABOLIC PANEL
Anion gap: 9 (ref 5–15)
BUN: 15 mg/dL (ref 8–23)
CO2: 34 mmol/L — ABNORMAL HIGH (ref 22–32)
Calcium: 8.7 mg/dL — ABNORMAL LOW (ref 8.9–10.3)
Chloride: 95 mmol/L — ABNORMAL LOW (ref 98–111)
Creatinine, Ser: 0.68 mg/dL (ref 0.44–1.00)
GFR, Estimated: >60 mL/min (ref >60)
Glucose, Bld: 134 mg/dL — ABNORMAL HIGH (ref 70–99)
Potassium: 3.9 mmol/L (ref 3.5–5.1)
Sodium: 138 mmol/L (ref 135–145)

## 2023-02-20 LAB — URINALYSIS, ROUTINE W REFLEX MICROSCOPIC
Bacteria, UA: NONE SEEN
Bilirubin Urine: NEGATIVE
Glucose, UA: NEGATIVE mg/dL
Ketones, ur: NEGATIVE mg/dL
Nitrite: NEGATIVE
Protein, ur: NEGATIVE mg/dL
Specific Gravity, Urine: 1.004 — ABNORMAL LOW (ref 1.005–1.030)
pH: 6 (ref 5.0–8.0)

## 2023-02-20 LAB — SARS CORONAVIRUS 2 BY RT PCR: SARS Coronavirus 2 by RT PCR: NEGATIVE

## 2023-02-20 MED ORDER — FLUTICASONE FUROATE-VILANTEROL 100-25 MCG/ACT IN AEPB: 1.0000 | INHALATION_SPRAY | Freq: Every day | RESPIRATORY_TRACT | Status: DC

## 2023-02-20 MED ORDER — ACETAMINOPHEN 650 MG RE SUPP: 650.0000 mg | Freq: Four times a day (QID) | RECTAL | Status: DC | PRN

## 2023-02-20 MED ORDER — DILTIAZEM HCL ER 240 MG PO CP24
240.0000 mg | ORAL_CAPSULE | Freq: Every day | ORAL | Status: DC
  Administered 2023-02-21 – 2023-02-24 (×4): 240 mg via ORAL
  Filled 2023-02-20 (×10): qty 1

## 2023-02-20 MED ORDER — OXYBUTYNIN CHLORIDE 5 MG PO TABS
5.0000 mg | ORAL_TABLET | Freq: Every day | ORAL | Status: DC
  Administered 2023-02-21 – 2023-02-24 (×4): 5 mg via ORAL
  Filled 2023-02-20 (×4): qty 1

## 2023-02-20 MED ORDER — PREDNISONE 50 MG PO TABS
60.0000 mg | ORAL_TABLET | Freq: Once | ORAL | Status: AC
  Administered 2023-02-20: 60 mg via ORAL
  Filled 2023-02-20: qty 1

## 2023-02-20 MED ORDER — ROFLUMILAST 500 MCG PO TABS
500.0000 ug | ORAL_TABLET | Freq: Every day | ORAL | Status: DC
  Administered 2023-02-21 – 2023-02-24 (×4): 500 ug via ORAL
  Filled 2023-02-20 (×5): qty 1

## 2023-02-20 MED ORDER — IPRATROPIUM-ALBUTEROL 0.5-2.5 (3) MG/3ML IN SOLN
3.0000 mL | Freq: Three times a day (TID) | RESPIRATORY_TRACT | Status: AC
  Administered 2023-02-21 (×3): 3 mL via RESPIRATORY_TRACT
  Filled 2023-02-20 (×3): qty 3

## 2023-02-20 MED ORDER — ESCITALOPRAM OXALATE 10 MG PO TABS
20.0000 mg | ORAL_TABLET | Freq: Every day | ORAL | Status: DC
  Administered 2023-02-21 – 2023-02-24 (×4): 20 mg via ORAL
  Filled 2023-02-20 (×4): qty 2

## 2023-02-20 MED ORDER — ENOXAPARIN SODIUM 40 MG/0.4ML IJ SOSY
40.0000 mg | PREFILLED_SYRINGE | INTRAMUSCULAR | Status: DC
  Administered 2023-02-20 – 2023-02-23 (×4): 40 mg via SUBCUTANEOUS
  Filled 2023-02-20 (×4): qty 0.4

## 2023-02-20 MED ORDER — GUAIFENESIN-DM 100-10 MG/5ML PO SYRP
15.0000 mL | ORAL_SOLUTION | Freq: Three times a day (TID) | ORAL | Status: AC
  Administered 2023-02-20 – 2023-02-21 (×3): 15 mL via ORAL
  Filled 2023-02-20 (×3): qty 15

## 2023-02-20 MED ORDER — LORATADINE 10 MG PO TABS
10.0000 mg | ORAL_TABLET | Freq: Every day | ORAL | Status: DC
  Administered 2023-02-21 – 2023-02-24 (×4): 10 mg via ORAL
  Filled 2023-02-20 (×4): qty 1

## 2023-02-20 MED ORDER — PREDNISONE 20 MG PO TABS: 40.0000 mg | ORAL_TABLET | Freq: Every day | ORAL | Status: DC

## 2023-02-20 MED ORDER — UMECLIDINIUM BROMIDE 62.5 MCG/ACT IN AEPB: 1.0000 | INHALATION_SPRAY | Freq: Every day | RESPIRATORY_TRACT | Status: DC

## 2023-02-20 MED ORDER — IPRATROPIUM-ALBUTEROL 0.5-2.5 (3) MG/3ML IN SOLN
3.0000 mL | Freq: Once | RESPIRATORY_TRACT | Status: AC
  Administered 2023-02-20: 3 mL via RESPIRATORY_TRACT
  Filled 2023-02-20: qty 3

## 2023-02-20 MED ORDER — MONTELUKAST SODIUM 10 MG PO TABS
10.0000 mg | ORAL_TABLET | Freq: Every day | ORAL | Status: DC
  Administered 2023-02-20 – 2023-02-23 (×4): 10 mg via ORAL
  Filled 2023-02-20 (×4): qty 1

## 2023-02-20 MED ORDER — SODIUM CHLORIDE 0.9 % IV SOLN
1.0000 g | INTRAVENOUS | Status: DC
  Administered 2023-02-20 – 2023-02-21 (×2): 1 g via INTRAVENOUS
  Filled 2023-02-20 (×2): qty 10

## 2023-02-20 MED ORDER — METHYLPREDNISOLONE SODIUM SUCC 125 MG IJ SOLR
60.0000 mg | Freq: Two times a day (BID) | INTRAMUSCULAR | Status: DC
  Administered 2023-02-21: 60 mg via INTRAVENOUS
  Filled 2023-02-20: qty 2

## 2023-02-20 MED ORDER — IPRATROPIUM-ALBUTEROL 0.5-2.5 (3) MG/3ML IN SOLN
3.0000 mL | Freq: Three times a day (TID) | RESPIRATORY_TRACT | Status: DC
  Administered 2023-02-20: 3 mL via RESPIRATORY_TRACT
  Filled 2023-02-20: qty 3

## 2023-02-20 MED ORDER — ALBUTEROL SULFATE (2.5 MG/3ML) 0.083% IN NEBU: 2.5000 mg | INHALATION_SOLUTION | RESPIRATORY_TRACT | Status: DC | PRN

## 2023-02-20 MED ORDER — DOXYCYCLINE HYCLATE 100 MG PO TABS
100.0000 mg | ORAL_TABLET | Freq: Two times a day (BID) | ORAL | Status: DC
  Administered 2023-02-20 – 2023-02-24 (×8): 100 mg via ORAL
  Filled 2023-02-20 (×8): qty 1

## 2023-02-20 MED ORDER — ALPRAZOLAM 0.5 MG PO TABS
0.2500 mg | ORAL_TABLET | Freq: Once | ORAL | Status: AC
  Administered 2023-02-20: 0.25 mg via ORAL
  Filled 2023-02-20: qty 1

## 2023-02-20 MED ORDER — POLYETHYLENE GLYCOL 3350 17 G PO PACK: 17.0000 g | PACK | Freq: Every day | ORAL | Status: DC | PRN

## 2023-02-20 MED ORDER — VALACYCLOVIR HCL 500 MG PO TABS
500.0000 mg | ORAL_TABLET | Freq: Every day | ORAL | Status: DC
  Administered 2023-02-21 – 2023-02-24 (×4): 500 mg via ORAL
  Filled 2023-02-20 (×4): qty 1

## 2023-02-20 MED ORDER — ACETAMINOPHEN 325 MG PO TABS
650.0000 mg | ORAL_TABLET | Freq: Four times a day (QID) | ORAL | Status: DC | PRN
  Administered 2023-02-20 – 2023-02-23 (×4): 650 mg via ORAL
  Filled 2023-02-20 (×4): qty 2

## 2023-02-20 MED ORDER — LORAZEPAM 0.5 MG PO TABS
0.5000 mg | ORAL_TABLET | Freq: Once | ORAL | Status: AC
  Administered 2023-02-20: 0.5 mg via ORAL
  Filled 2023-02-20: qty 1

## 2023-02-20 NOTE — Assessment & Plan Note (Addendum)
Increased cough, dyspnea, with acute on chronic respiratory failure.  Barely any air movement on lung exam, with faint wheezing.  COVID test negative.  Chest x-ray with no radiologic evidence of acute pulmonary disease.  But on my view appears to have somewhat increased density to the right lower lobe compared to previous x-ray.  Will need antibiotics anyway for COPD exacerbation and possible UTI. -Prednisone 60 mg given in ED, continue with IV Solu-Medrol 60 twice daily in a.m. -Mucolytic's -DuoNebs as needed and scheduled - oral Doxycycline, (allergy to telithromycin), will add ceftriaxone for possible UTI -Resume home steroid inhalers, montelukast, loratadine

## 2023-02-20 NOTE — ED Notes (Signed)
Pt sitting up on side of bed looking at phone. Denies needs at this time. Call light within reach, bed locked and in lowest position.

## 2023-02-20 NOTE — ED Notes (Signed)
Pt states she needs bipap at night. MD notified.

## 2023-02-20 NOTE — H&P (Addendum)
History and Physical    [name] QMV:[id number] DOB: 15-Mar-1956 DOA: 02/20/2023  PCP: Carylon Perches, MD   Patient coming from: Home  I have personally briefly reviewed patient's old medical records in [facility]  Chief Complaint: Home  HPI: DANNIELA [name] is a 67 y.o. 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 Patient presented to the ED with complaints of persistent difficulty breathing over the past 2 weeks.  She also reports worsening of her cough which is dry.  Difficulty breathing is worse with ambulation, she becomes severely short of breath, with O2 sats dropping to 70% at home on 4 L.  No chest pain, no leg swelling or pain. She reports urinary frequency and cloudy colored urine.  ED Course: Temperature 98.4.  Heart rate mostly 80s to 90s.  Respirate rate 18-30, blood pressure systolic 120s to 284X.  O2 sats 90 to 100% on 4 L, drops to 87% with ambulation. Chest x-ray read as negative for acute abnormality. Troponin less than 2, 2 COVID Test negative DuoNebs, 60 mg prednisone, given in ED. Hospitalist to admit for COPD with acute exacerbation.  Review of Systems: As per HPI all other systems reviewed and negative.  Past Medical History:  Diagnosis Date   Allergy    Anemia    Anxiety    Arthritis    COPD (chronic obstructive pulmonary disease) (HCC)    Home oxygen at [facility]   Depression    Diverticulosis    Dyspnea    Dysrhythmia    GERD (gastroesophageal reflux disease)    History of cardiac catheterization    No significant CAD 2005   Hyperlipidemia    Hypoglycemia    IBS (irritable bowel syndrome)    Neuromuscular disorder (HCC)    Osteoporosis    Paroxysmal SVT (supraventricular tachycardia) (HCC)    Pneumonia 2015   Pre-diabetes    Shingles 2012   Sleep apnea     Past Surgical History:  Procedure Laterality Date   BACK SURGERY     BIOPSY  09/12/2021    Procedure: BIOPSY;  Surgeon: Malissa Hippo, MD;  [facility];  Service: Endoscopy;;   BREAST SURGERY     CARDIAC CATHETERIZATION  2004   COLONOSCOPY  03/22/2011   Procedure: COLONOSCOPY;  Surgeon: Malissa Hippo, MD;  [facility];  Service: Endoscopy;  Laterality: N/A;   COLONOSCOPY WITH PROPOFOL N/A 05/16/2017   Procedure: COLONOSCOPY WITH PROPOFOL;  Surgeon: Malissa Hippo, MD;  [facility];  Service: Endoscopy;  Laterality: N/A;   COLONOSCOPY WITH PROPOFOL N/A 09/12/2021   Procedure: COLONOSCOPY WITH PROPOFOL;  Surgeon: Malissa Hippo, MD;  [facility];  Service: Endoscopy;  Laterality: N/A;  1230 ASA 2   ESOPHAGEAL DILATION N/A 04/19/2020   Procedure: ESOPHAGEAL DILATION;  Surgeon: Malissa Hippo, MD;  [facility];  Service: Endoscopy;  Laterality: N/A;   ESOPHAGOGASTRODUODENOSCOPY (EGD) WITH PROPOFOL N/A 04/19/2020   Procedure: ESOPHAGOGASTRODUODENOSCOPY (EGD) WITH PROPOFOL;  Surgeon: Malissa Hippo, MD;  [facility];  Service: Endoscopy;  Laterality: N/A;  9:15   ESOPHAGOGASTRODUODENOSCOPY (EGD) WITH PROPOFOL N/A 09/12/2021   Procedure: ESOPHAGOGASTRODUODENOSCOPY (EGD) WITH PROPOFOL;  Surgeon: Malissa Hippo, MD;  [facility];  Service: Endoscopy;  Laterality: N/A;   FLEXIBLE SIGMOIDOSCOPY N/A 09/26/2017   Procedure: FLEXIBLE SIGMOIDOSCOPY with Propofol ;  Surgeon: Malissa Hippo, MD;  [facility];  Service: Endoscopy;  Laterality: N/A;  9:30   FLEXIBLE SIGMOIDOSCOPY N/A 06/04/2022   Procedure: FLEXIBLE SIGMOIDOSCOPY;  Surgeon: Dolores Frame, MD;  [facility];  Service: Gastroenterology;  Laterality: N/A;  10:30am, asa 3   OSTOMY N/A 02/07/2022   Procedure: POSSIBLE OSTOMY;  Surgeon: Karie Soda, MD;  Location: [facility];  Service: General;  Laterality: N/A;   POLYPECTOMY  05/16/2017   Procedure: POLYPECTOMY;  Surgeon: Malissa Hippo, MD;  [facility];  Service: Endoscopy;;   POLYPECTOMY  09/12/2021   Procedure: POLYPECTOMY INTESTINAL;  Surgeon: Malissa Hippo, MD;  [facility];  Service: Endoscopy;;   PROCTOSCOPY N/A 02/07/2022   Procedure: RIGID PROCTOSCOPY;  Surgeon: Karie Soda, MD;  Location: [facility];  Service: General;  Laterality: N/A;   SPINAL FUSION     TOTAL HIP ARTHROPLASTY Left 07/15/2022   Procedure: TOTAL HIP ARTHROPLASTY;  Surgeon: Joen Laura, MD;  Location: [facility];  Service: Orthopedics;  Laterality: Left;   VULVECTOMY       reports that she quit smoking about 7 years ago. Her smoking use included cigarettes. She started smoking about 47 years ago. She has a 40 pack-year smoking history. She has been exposed to tobacco smoke. She has never used smokeless tobacco. She reports current alcohol use. She reports that she does not use drugs.  Allergies  Allergen Reactions   Ketek [Telithromycin] Other (See Comments)    Loopy, eyes went different directions   Penicillins Other (See Comments)    Unknown, childhood allergy   Adhesive [Tape] Itching and Rash    Break out   Chantix [Varenicline] Palpitations    Family History  Problem Relation Age of Onset   Asthma Mother    Heart attack Mother    Diabetes Mother    Heart failure Mother    Colon cancer Neg Hx    Prior to Admission medications   Medication Sig Start Date End Date Taking? Authorizing Provider  albuterol (VENTOLIN HFA) 108 (90 Base) MCG/ACT inhaler Inhale 1-2 puffs into the lungs every 6 (six) hours as needed for wheezing or shortness of breath.   Yes [provider]  atorvastatin (LIPITOR) 40 MG tablet TAKE 1 TABLET DAILY 07/01/22  Yes Jonelle Sidle, MD  azelastine (ASTELIN) 0.1 % nasal spray Place 1 spray into both nostrils daily as needed for rhinitis or allergies. Use in each nostril as directed   Yes [provider]  carboxymethylcellulose 1 % ophthalmic solution Place 2 drops into both eyes 3  (three) times daily as needed (for dry eyes).   Yes [provider]  cetirizine (ZYRTEC) 10 MG tablet Take 10 mg by mouth daily.   Yes [provider]  diltiazem (DILACOR XR) 240 MG 24 hr capsule Take 1 capsule (240 mg total) by mouth daily. 10/07/22  Yes Jonelle Sidle, MD  escitalopram (LEXAPRO) 20 MG tablet Take 20 mg by mouth daily.   Yes [provider]  Fluticasone-Umeclidin-Vilant (TRELEGY ELLIPTA) 100-62.5-25 MCG/ACT AEPB Inhale 1 puff into the lungs daily.   Yes [provider]  ibuprofen (ADVIL) 200 MG tablet Take 400 mg by mouth every 6 (six) hours as needed for moderate pain.   Yes [provider]  montelukast (SINGULAIR) 10 MG tablet Take 1 tablet (10 mg total) by mouth at bedtime. 01/26/15  Yes Erick Blinks, MD  oxybutynin (DITROPAN) 5 MG tablet Take 5 mg by mouth daily.  08/28/13  Yes  [provider]  OXYGEN Inhale 3.5-7 L into the lungs continuous.   Yes [provider]  roflumilast (DALIRESP) 500 MCG TABS tablet Take 500 mcg by mouth daily.   Yes [provider]  valACYclovir (VALTREX) 500 MG tablet Take 500 mg by mouth daily. 12/31/21  Yes [provider]  NON FORMULARY Pt uses a bipap with 4L of oxygen when laying down    [provider]    Physical Exam: Vitals:   02/20/23 1415 02/20/23 1530 02/20/23 1544 02/20/23 1627  BP: (!) 143/67 (!) 133/92 (!) 140/73 (!) 154/68  Pulse: 84 90 95 94  Resp:   18 (!) 30  Temp:   98.1 F (36.7 C)   TempSrc:   Oral   SpO2: 97% 99% 98% 96%  Weight:      Height:        Constitutional: NAD, calm, comfortable Vitals:   02/20/23 1415 02/20/23 1530 02/20/23 1544 02/20/23 1627  BP: (!) 143/67 (!) 133/92 (!) 140/73 (!) 154/68  Pulse: 84 90 95 94  Resp:   18 (!) 30  Temp:   98.1 F (36.7 C)   TempSrc:   Oral   SpO2: 97% 99% 98% 96%  Weight:      Height:       Eyes: PERRL, lids and conjunctivae normal ENMT: Mucous membranes are moist Neck:  normal, supple, no masses, no thyromegaly Respiratory: Reduced air entry bilaterally, some faint expiratory wheezing anteriorly, mild increased work of breathing, tachypeic. Normal respiratory effort. No accessory muscle use.  Cardiovascular: Regular rate and rhythm, no murmurs / rubs / gallops. No extremity edema.  Abdomen: no tenderness, no masses palpated. No hepatosplenomegaly. Bowel sounds positive.  Musculoskeletal: no clubbing / cyanosis. No joint deformity upper and lower extremities.  Skin: no rashes, lesions, ulcers. No induration Neurologic: No facial symmetry, moving EXTR spontaneously..,  Speech fluent Psychiatric: Normal judgment and insight. Alert and oriented x 3. Normal mood.   Labs on Admission: I have personally reviewed following labs and imaging studies  CBC: Recent Labs  Lab 02/20/23 1121  WBC 9.4  NEUTROABS 6.3  HGB 12.9  HCT 39.0  MCV 96.8  PLT 310   Basic Metabolic Panel: Recent Labs  Lab 02/20/23 1121  NA 138  K 3.9  CL 95*  CO2 34*  GLUCOSE 134*  BUN 15  CREATININE 0.68  CALCIUM 8.7*   Urine analysis:    Component Value Date/Time   COLORURINE STRAW (A) 02/20/2023 1050   APPEARANCEUR CLEAR 02/20/2023 1050   LABSPEC 1.004 (L) 02/20/2023 1050   PHURINE 6.0 02/20/2023 1050   GLUCOSEU NEGATIVE 02/20/2023 1050   HGBUR MODERATE (A) 02/20/2023 1050   BILIRUBINUR NEGATIVE 02/20/2023 1050   KETONESUR NEGATIVE 02/20/2023 1050   PROTEINUR NEGATIVE 02/20/2023 1050   UROBILINOGEN 0.2 01/20/2015 1957   NITRITE NEGATIVE 02/20/2023 1050   LEUKOCYTESUR SMALL (A) 02/20/2023 1050    Radiological Exams on Admission: DG Chest 2 View  Result Date: 02/20/2023 CLINICAL DATA:  Shortness of breath.  History of COPD. EXAM: CHEST - 2 VIEW COMPARISON:  10/14/2022. FINDINGS: Redemonstration of extensive subcentimeter calcified granulomas throughout bilateral lungs. Bilateral lungs appear hyperexpanded and hyperlucent with coarse bronchovascular markings, in keeping  with COPD. Bilateral lungs otherwise appear clear. No dense consolidation or lung collapse. Bilateral costophrenic angles are clear. Normal cardio-mediastinal silhouette. No acute osseous abnormalities. The soft tissues are within normal limits. IMPRESSION: 1. No radiographic evidence of acute cardiopulmonary disease. 2. COPD. Electronically Signed   By:  Jules Schick M.D.   On: 02/20/2023 13:00    EKG: Independently reviewed.  Sinus rhythm, rate 94, QTc 425.  No significant change from prior.  Assessment/Plan Principal Problem:   COPD with acute exacerbation (HCC) Active Problems:   Acute on chronic respiratory failure with hypoxia (HCC)   Gastroesophageal reflux disease   UTI (urinary tract infection)  Assessment and Plan: * COPD with acute exacerbation (HCC) Increased cough, dyspnea, with acute on chronic respiratory failure.  Barely any air movement on lung exam, with faint wheezing.  COVID test negative.  Chest x-ray with no radiologic evidence of acute pulmonary disease.  But on my view appears to have somewhat increased density to the right lower lobe compared to previous x-ray.  Will need antibiotics anyway for COPD exacerbation and possible UTI. -Prednisone 60 mg given in ED, continue with IV Solu-Medrol 60 twice daily in a.m. -Mucolytic's -DuoNebs as needed and scheduled - oral Doxycycline, (allergy to telithromycin), will add ceftriaxone for possible UTI -Resume home steroid inhalers, montelukast, loratadine  Acute on chronic respiratory failure with hypoxia (HCC) On 4 L chronically, O2 sats here dropped to 87% on 4 L after bronchodilators and steroids.  Secondary to COPD exacerbation.  OSA (obstructive sleep apnea) Resume BiPAP nightly.  UTI (urinary tract infection) Reports urinary frequency.  Afebrile without leukocytosis.  Rules out for sepsis.  UA with small leukocytes. -Obtain urine cultures -IV ceftriaxone   DVT prophylaxis: Lovenox Code Status: FULL- confirmed  with patient at bedside Family Communication: None at bedside Disposition Plan:  ~ 2 days Consults called: None  Admission status: Obs tele    Author: Onnie Boer, MD 02/20/2023 5:21 PM  For on call review [email or web address].

## 2023-02-20 NOTE — Assessment & Plan Note (Signed)
On 4 L chronically, O2 sats here dropped to 87% on 4 L after bronchodilators and steroids.  Secondary to COPD exacerbation.

## 2023-02-20 NOTE — ED Triage Notes (Signed)
Pt presents with SOB x 2 weeks, chronically on 4 liters of O2, history of COPD.

## 2023-02-20 NOTE — ED Provider Notes (Signed)
[place] EMERGENCY DEPARTMENT AT [facility] Provider Note   CSN: [id number] Arrival date & time: 02/20/23  1039     History  Chief Complaint  Patient presents with   Shortness of Breath    [name] is a 67 y.o. female.  67 year old female history of COPD on 4 L of oxygen presenting emergency department for shortness of breath.  Reports compliance with her home medications.  She had viral URI symptoms with rhinorrhea congestion roughly a week and a half ago.  She has had worsening shortness of breath since that time.  Seemingly worse in the past day or 2.  Notes that her oxygen saturation is fine when she is sitting, but will drop into the upper 70s with ambulation and she gets extremely short of breath.  She notes that she has had some palpitations, but no chest pain.  Some lightheadedness with ambulation.   Shortness of Breath      Home Medications Prior to Admission medications   Medication Sig Start Date End Date Taking? Authorizing Provider  albuterol (PROVENTIL) (2.5 MG/3ML) 0.083% nebulizer solution Take 3 mLs (2.5 mg total) by nebulization every 6 (six) hours as needed for wheezing or shortness of breath. 11/25/22   Coralyn Helling, MD  albuterol (VENTOLIN HFA) 108 (90 Base) MCG/ACT inhaler Inhale 1-2 puffs into the lungs every 6 (six) hours as needed for wheezing or shortness of breath.    [provider]  ALPRAZolam Prudy Feeler) 0.25 MG tablet Take 0.25 mg by mouth 3 (three) times daily as needed for anxiety.  06/17/16   Kari Baars, MD  atorvastatin (LIPITOR) 40 MG tablet TAKE 1 TABLET DAILY 07/01/22   Jonelle Sidle, MD  azelastine (ASTELIN) 0.1 % nasal spray Place 1 spray into both nostrils daily as needed for rhinitis or allergies. Use in each nostril as directed    [provider]  carboxymethylcellulose 1 % ophthalmic solution Place 2 drops into both eyes 3 (three) times daily as needed (for dry eyes).    [provider]   cetirizine (ZYRTEC) 10 MG tablet Take 10 mg by mouth daily.    [provider]  clonazePAM (KLONOPIN) 0.5 MG tablet Take 1 tablet (0.5 mg total) by mouth at bedtime. Patient taking differently: Take 0.5 mg by mouth at bedtime as needed for anxiety. 02/14/20   Coralyn Helling, MD  diclofenac Sodium (VOLTAREN) 1 % GEL Apply 2 g topically 2 (two) times daily as needed (pain).    [provider]  diltiazem (CARDIZEM) 30 MG tablet Take 1 tablet (30 mg total) by mouth 3 (three) times daily. Take 1-2 tablets every 6 hours as needed for persistent tachycardai Patient taking differently: Take 30 mg by mouth daily as needed (As needed for tachycardia). 02/13/22   Jonelle Sidle, MD  diltiazem (DILACOR XR) 240 MG 24 hr capsule Take 1 capsule (240 mg total) by mouth daily. 10/07/22   Jonelle Sidle, MD  escitalopram (LEXAPRO) 20 MG tablet Take 20 mg by mouth daily.    [provider]  Fluticasone-Umeclidin-Vilant (TRELEGY ELLIPTA) 100-62.5-25 MCG/ACT AEPB Inhale 1 puff into the lungs daily.    [provider]  hydrOXYzine (ATARAX) 25 MG tablet Take 0.5 tablets (12.5 mg total) by mouth every 6 (six) hours as needed for itching. 04/08/22   Tiffany Kocher, PA-C  ibuprofen (ADVIL) 200 MG tablet Take 400 mg by mouth every 6 (six) hours as needed for moderate pain.    [provider]  methocarbamol (ROBAXIN) 500 MG tablet Take 500-1,000 mg by mouth every 6 (six) hours as needed for muscle spasms.    [provider]  montelukast (SINGULAIR) 10 MG tablet Take 1 tablet (10 mg total) by mouth at bedtime. 01/26/15   Erick Blinks, MD  NON FORMULARY Pt uses a bipap with 4L of oxygen when laying down    [provider]  oxybutynin (DITROPAN) 5 MG tablet Take 5 mg by mouth daily.  08/28/13   [provider]  OXYGEN Inhale 3.5-7 L into the lungs continuous.    [provider]  promethazine (PHENERGAN) 12.5 MG tablet Take 12.5 mg by mouth  every 6 (six) hours as needed for vomiting or nausea. 02/25/22   [provider]  roflumilast (DALIRESP) 500 MCG TABS tablet Take 500 mcg by mouth daily.    [provider]  Sodium Chloride (NASAL MIST IN) Inhale into the lungs. Using twice a day    [provider]  traZODone (DESYREL) 50 MG tablet Take 50 mg by mouth at bedtime as needed for sleep.    [provider]  valACYclovir (VALTREX) 500 MG tablet Take 500 mg by mouth daily. 12/31/21   [provider]      Allergies    Benadryl [diphenhydramine], Ketek [telithromycin], Penicillins, Adhesive [tape], Chantix [varenicline], and Iohexol    Review of Systems   Review of Systems  Respiratory:  Positive for shortness of breath.     Physical Exam Updated Vital Signs BP (!) 143/67   Pulse 84   Temp 98.4 F (36.9 C) (Oral)   Resp (!) 22   Ht 5\' 2"  (1.575 m)   Wt 59 kg   SpO2 97%   BMI 23.78 kg/m  Physical Exam Vitals and nursing note reviewed.  Constitutional:      General: She is not in acute distress.    Appearance: She is not toxic-appearing.  HENT:     Head: Normocephalic.  Cardiovascular:     Rate and Rhythm: Normal rate and regular rhythm.  Pulmonary:     Effort: Pulmonary effort is normal.     Breath sounds: Wheezing present.     Comments: Poor air movement and decreased breath sounds throughout all lung fields. Skin:    General: Skin is warm and dry.     Capillary Refill: Capillary refill takes less than 2 seconds.  Neurological:     General: No focal deficit present.     Mental Status: She is alert.  Psychiatric:        Mood and Affect: Mood normal.        Behavior: Behavior normal.     ED Results / Procedures / Treatments   Labs (all labs ordered are listed, but only abnormal results are displayed) Labs Reviewed  BASIC METABOLIC PANEL - Abnormal; Notable for the following components:      Result Value   Chloride 95 (*)    CO2 34 (*)    Glucose, Bld 134 (*)     Calcium 8.7 (*)    All other components within normal limits  URINALYSIS, ROUTINE W REFLEX MICROSCOPIC - Abnormal; Notable for the following components:   Color, Urine STRAW (*)    Specific Gravity, Urine 1.004 (*)    Hgb urine dipstick MODERATE (*)    Leukocytes,Ua SMALL (*)    All other components within normal limits  SARS CORONAVIRUS 2 BY RT PCR  CBC WITH DIFFERENTIAL/PLATELET  TROPONIN I (HIGH SENSITIVITY)  TROPONIN I (HIGH  SENSITIVITY)    EKG EKG Interpretation Date/Time:  Thursday February 20 2023 10:51:09 EDT Ventricular Rate:  94 PR Interval:  110 QRS Duration:  74 QT Interval:  340 QTC Calculation: 425 R Axis:   90  Text Interpretation: Sinus rhythm with short PR Rightward axis Nonspecific ST abnormality Abnormal ECG When compared with ECG of 13-May-2017 15:10, No significant change was found Confirmed by Estanislado Pandy [phone]) on 02/20/2023 12:13:10 PM  Radiology DG Chest 2 View  Result Date: 02/20/2023 CLINICAL DATA:  Shortness of breath.  History of COPD. EXAM: CHEST - 2 VIEW COMPARISON:  10/14/2022. FINDINGS: Redemonstration of extensive subcentimeter calcified granulomas throughout bilateral lungs. Bilateral lungs appear hyperexpanded and hyperlucent with coarse bronchovascular markings, in keeping with COPD. Bilateral lungs otherwise appear clear. No dense consolidation or lung collapse. Bilateral costophrenic angles are clear. Normal cardio-mediastinal silhouette. No acute osseous abnormalities. The soft tissues are within normal limits. IMPRESSION: 1. No radiographic evidence of acute cardiopulmonary disease. 2. COPD. Electronically Signed   By: Jules Schick M.D.   On: 02/20/2023 13:00    Procedures Procedures    Medications Ordered in ED Medications  ALPRAZolam (XANAX) tablet 0.25 mg (has no administration in time range)  predniSONE (DELTASONE) tablet 60 mg (60 mg Oral Given 02/20/23 1318)  ipratropium-albuterol (DUONEB) 0.5-2.5 (3) MG/3ML nebulizer  solution 3 mL (3 mLs Nebulization Given 02/20/23 1302)    ED Course/ Medical Decision Making/ A&P Clinical Course as of 02/20/23 1506  Thu Feb 20, 2023  1505 Patient continues to look well on her home 4 L, however attempted to ambulate after breathing treatment and steroid with desaturation on 4 L down to mid 80s with significant increase in work of breathing.  She has no fever or leukocytosis or tachycardia to suggest systemic infection.  COVID-negative.  Chest x-ray read as negative by radiology, question subtle right lower lobe infiltrate.  However again no fever tachycardia leukocytosis.  Given patient's desaturation and worsening symptoms on her home oxygen will admit for COPD exacerbation. [TY]    Clinical Course User Index [TY] Coral Spikes, DO                                 Medical Decision Making CT 89-year-old female present emergency department for shortness of breath in the setting of COPD on 4 L of oxygen at baseline.  Patient is afebrile nontachycardic maintaining oxygen saturation at rest on her home 4 L.  Workup ordered by triage largely reassuring with no leukocytosis to suggest systemic infection.  Given her viral symptoms, COVID was ordered and it was negative.  No significant metabolic derangements.  Troponin negative.  EKG without ST segment changes to indicate ischemia.  Cardiac etiology unlikely.  She is low risk for PE based on Wells criteria and also reports recent workup for PE.  Chest x-ray reviewed possible right lower lobe infiltrate but awaiting radiology final read..  Will give steroid and DuoNeb and reassess.  Amount and/or Complexity of Data Reviewed Labs: ordered. Radiology: ordered.  Risk Prescription drug management. Decision regarding hospitalization.          Final Clinical Impression(s) / ED Diagnoses Final diagnoses:  COPD exacerbation [facility])    Rx / DC Orders ED Discharge Orders     None         Coral Spikes, DO 02/20/23  1506

## 2023-02-20 NOTE — Assessment & Plan Note (Signed)
Reports urinary frequency.  Afebrile without leukocytosis.  Rules out for sepsis.  UA with small leukocytes. -Obtain urine cultures -IV ceftriaxone

## 2023-02-20 NOTE — ED Notes (Signed)
Pt assisted to bathroom by NT.

## 2023-02-20 NOTE — Assessment & Plan Note (Signed)
Resume BiPAP nightly.

## 2023-02-20 NOTE — ED Notes (Signed)
Pt walked approximately 25 feet and O2 dropped from 96 to 87. Unable to tolerate walking back to room. Pt assisted back in wheelchair.

## 2023-02-21 DIAGNOSIS — Z1152 Encounter for screening for COVID-19: Secondary | ICD-10-CM | POA: Diagnosis not present

## 2023-02-21 DIAGNOSIS — F419 Anxiety disorder, unspecified: Secondary | ICD-10-CM | POA: Diagnosis present

## 2023-02-21 DIAGNOSIS — R0609 Other forms of dyspnea: Secondary | ICD-10-CM | POA: Diagnosis not present

## 2023-02-21 DIAGNOSIS — Z66 Do not resuscitate: Secondary | ICD-10-CM | POA: Diagnosis present

## 2023-02-21 DIAGNOSIS — G4733 Obstructive sleep apnea (adult) (pediatric): Secondary | ICD-10-CM | POA: Diagnosis present

## 2023-02-21 DIAGNOSIS — Z833 Family history of diabetes mellitus: Secondary | ICD-10-CM | POA: Diagnosis not present

## 2023-02-21 DIAGNOSIS — B9620 Unspecified Escherichia coli [E. coli] as the cause of diseases classified elsewhere: Secondary | ICD-10-CM | POA: Diagnosis present

## 2023-02-21 DIAGNOSIS — N390 Urinary tract infection, site not specified: Secondary | ICD-10-CM | POA: Diagnosis present

## 2023-02-21 DIAGNOSIS — J441 Chronic obstructive pulmonary disease with (acute) exacerbation: Secondary | ICD-10-CM | POA: Diagnosis present

## 2023-02-21 DIAGNOSIS — G709 Myoneural disorder, unspecified: Secondary | ICD-10-CM | POA: Diagnosis present

## 2023-02-21 DIAGNOSIS — Z8249 Family history of ischemic heart disease and other diseases of the circulatory system: Secondary | ICD-10-CM | POA: Diagnosis not present

## 2023-02-21 DIAGNOSIS — Z23 Encounter for immunization: Secondary | ICD-10-CM | POA: Diagnosis present

## 2023-02-21 DIAGNOSIS — M810 Age-related osteoporosis without current pathological fracture: Secondary | ICD-10-CM | POA: Diagnosis present

## 2023-02-21 DIAGNOSIS — Z825 Family history of asthma and other chronic lower respiratory diseases: Secondary | ICD-10-CM | POA: Diagnosis not present

## 2023-02-21 DIAGNOSIS — Z981 Arthrodesis status: Secondary | ICD-10-CM | POA: Diagnosis not present

## 2023-02-21 DIAGNOSIS — Z8701 Personal history of pneumonia (recurrent): Secondary | ICD-10-CM | POA: Diagnosis not present

## 2023-02-21 DIAGNOSIS — R7302 Impaired glucose tolerance (oral): Secondary | ICD-10-CM | POA: Diagnosis present

## 2023-02-21 DIAGNOSIS — E785 Hyperlipidemia, unspecified: Secondary | ICD-10-CM | POA: Diagnosis present

## 2023-02-21 DIAGNOSIS — I4719 Other supraventricular tachycardia: Secondary | ICD-10-CM | POA: Diagnosis present

## 2023-02-21 DIAGNOSIS — N3000 Acute cystitis without hematuria: Secondary | ICD-10-CM | POA: Diagnosis not present

## 2023-02-21 DIAGNOSIS — J9621 Acute and chronic respiratory failure with hypoxia: Secondary | ICD-10-CM | POA: Diagnosis present

## 2023-02-21 DIAGNOSIS — I10 Essential (primary) hypertension: Secondary | ICD-10-CM | POA: Diagnosis present

## 2023-02-21 DIAGNOSIS — Z87891 Personal history of nicotine dependence: Secondary | ICD-10-CM | POA: Diagnosis not present

## 2023-02-21 DIAGNOSIS — Z96642 Presence of left artificial hip joint: Secondary | ICD-10-CM | POA: Diagnosis present

## 2023-02-21 DIAGNOSIS — K219 Gastro-esophageal reflux disease without esophagitis: Secondary | ICD-10-CM | POA: Diagnosis present

## 2023-02-21 DIAGNOSIS — F32A Depression, unspecified: Secondary | ICD-10-CM | POA: Diagnosis present

## 2023-02-21 LAB — CBC
HCT: 34.7 % — ABNORMAL LOW (ref 36.0–46.0)
Hemoglobin: 11.2 g/dL — ABNORMAL LOW (ref 12.0–15.0)
MCH: 31.6 pg (ref 26.0–34.0)
MCHC: 32.3 g/dL (ref 30.0–36.0)
MCV: 98 fL (ref 80.0–100.0)
Platelets: 272 10*3/uL (ref 150–400)
RBC: 3.54 MIL/uL — ABNORMAL LOW (ref 3.87–5.11)
RDW: 12.2 % (ref 11.5–15.5)
WBC: 8.3 10*3/uL (ref 4.0–10.5)
nRBC: 0 % (ref 0.0–0.2)

## 2023-02-21 LAB — BASIC METABOLIC PANEL
Anion gap: 8 (ref 5–15)
BUN: 17 mg/dL (ref 8–23)
CO2: 33 mmol/L — ABNORMAL HIGH (ref 22–32)
Calcium: 8.4 mg/dL — ABNORMAL LOW (ref 8.9–10.3)
Chloride: 96 mmol/L — ABNORMAL LOW (ref 98–111)
Creatinine, Ser: 0.59 mg/dL (ref 0.44–1.00)
GFR, Estimated: >60 mL/min (ref >60)
Glucose, Bld: 124 mg/dL — ABNORMAL HIGH (ref 70–99)
Potassium: 3.9 mmol/L (ref 3.5–5.1)
Sodium: 137 mmol/L (ref 135–145)

## 2023-02-21 LAB — HIV ANTIBODY (ROUTINE TESTING W REFLEX): HIV Screen 4th Generation wRfx: NONREACTIVE

## 2023-02-21 MED ORDER — ARFORMOTEROL TARTRATE 15 MCG/2ML IN NEBU
15.0000 ug | INHALATION_SOLUTION | Freq: Two times a day (BID) | RESPIRATORY_TRACT | Status: DC
  Administered 2023-02-21 – 2023-02-24 (×7): 15 ug via RESPIRATORY_TRACT
  Filled 2023-02-21 (×7): qty 2

## 2023-02-21 MED ORDER — ORAL CARE MOUTH RINSE: 15.0000 mL | OROMUCOSAL | Status: DC | PRN

## 2023-02-21 MED ORDER — ALPRAZOLAM 0.25 MG PO TABS
0.2500 mg | ORAL_TABLET | Freq: Two times a day (BID) | ORAL | Status: DC | PRN
  Administered 2023-02-21 – 2023-02-24 (×6): 0.25 mg via ORAL
  Filled 2023-02-21 (×6): qty 1

## 2023-02-21 MED ORDER — BUDESONIDE 0.5 MG/2ML IN SUSP
0.5000 mg | Freq: Two times a day (BID) | RESPIRATORY_TRACT | Status: DC
  Administered 2023-02-21 – 2023-02-24 (×7): 0.5 mg via RESPIRATORY_TRACT
  Filled 2023-02-21 (×7): qty 2

## 2023-02-21 MED ORDER — INFLUENZA VAC A&B SURF ANT ADJ 0.5 ML IM SUSY
0.5000 mL | PREFILLED_SYRINGE | INTRAMUSCULAR | Status: AC
  Administered 2023-02-22: 0.5 mL via INTRAMUSCULAR
  Filled 2023-02-21: qty 0.5

## 2023-02-21 MED ORDER — METHYLPREDNISOLONE SODIUM SUCC 125 MG IJ SOLR
60.0000 mg | Freq: Two times a day (BID) | INTRAMUSCULAR | Status: DC
  Administered 2023-02-21 – 2023-02-24 (×6): 60 mg via INTRAVENOUS
  Filled 2023-02-21 (×7): qty 2

## 2023-02-21 MED ORDER — REVEFENACIN 175 MCG/3ML IN SOLN
175.0000 ug | Freq: Every day | RESPIRATORY_TRACT | Status: DC
  Administered 2023-02-22 – 2023-02-24 (×3): 175 ug via RESPIRATORY_TRACT
  Filled 2023-02-21 (×3): qty 3

## 2023-02-21 NOTE — Progress Notes (Signed)
   02/21/23 1122  TOC Brief Assessment  Insurance and Status Reviewed  Patient has primary care physician Yes  Home environment has been reviewed from home  Prior level of function: independent  Prior/Current Home Services No current home services  Social Determinants of Health Reivew SDOH reviewed no interventions necessary  Readmission risk has been reviewed Yes  Transition of care needs no transition of care needs at this time    Transition of Care Department [facility] has reviewed patient and no TOC needs have been identified at this time. We will continue to monitor patient advancement through interdisciplinary progression rounds. If new patient transition needs arise, please place a TOC consult.

## 2023-02-21 NOTE — Hospital Course (Signed)
67 year old female with a history of COPD, chronic respiratory failure on 4 L, anxiety, SVT, impaired glucose tolerance, and nonspecific neuromuscular disorder presenting with 2 days of worsening shortness of breath.  The patient states that she had URI type symptoms with chest and head congestion and worsening nonproductive cough about 3 weeks prior to this admission.  She states that she initially got some white improvement, but has gradually gotten worse, particularly in the past 2 days prior to admission.  She endorses worsening dyspnea on exertion.  She checked her oxygen saturation at home on her usual 4 L and it has decreased to the 70% range.  As result, she presented for further evaluation and treatment.  She denies any fevers, chills, chest pain, nausea, vomiting, diarrhea.  She has some occasional loose stools.  There is no hematochezia or melena.  There is no abdominal pain.  There is no hemoptysis.  Her cough is largely nonproductive.  She complains of a lot of head congestion.   The patient quit smoking over 10 years ago after over 30-pack-year history. In the ED, the patient was afebrile and hemodynamically stable with oxygen saturation 97% on 4 L.  WBC 9.4, hemoglobin 12.9, platelets 272.  Sodium 137, potassium 3.9, bicarbonate 23, serum creatinine 0.59.  Troponin <2 and then 2.  EKG showed sinus rhythm with nonspecific ST changes.  COVID-19 PCR is negative.  Chest x-ray showed hyperinflation with coarse bronchovascular markings.  The patient was started on bronchodilators and Solu-Medrol.

## 2023-02-21 NOTE — Plan of Care (Signed)
  Problem: Education: Goal: Knowledge of disease or condition will improve Outcome: Progressing   Problem: Activity: Goal: Ability to tolerate increased activity will improve Outcome: Progressing   Problem: Respiratory: Goal: Ability to maintain a clear airway will improve Outcome: Progressing Goal: Levels of oxygenation will improve Outcome: Progressing Goal: Ability to maintain adequate ventilation will improve Outcome: Progressing   Problem: Activity: Goal: Risk for activity intolerance will decrease Outcome: Progressing   Problem: Nutrition: Goal: Adequate nutrition will be maintained Outcome: Progressing   Problem: Coping: Goal: Level of anxiety will decrease Outcome: Progressing   Problem: Pain Managment: Goal: General experience of comfort will improve Outcome: Progressing   Problem: Safety: Goal: Ability to remain free from injury will improve Outcome: Progressing

## 2023-02-21 NOTE — Progress Notes (Signed)
PROGRESS NOTE  [name] XLK:[id number] DOB: Sep 28, 1955 DOA: 02/20/2023 PCP: Carylon Perches, MD  Brief History:  67 year old female with a history of COPD, chronic respiratory failure on 4 L, anxiety, SVT, impaired glucose tolerance, and nonspecific neuromuscular disorder presenting with 2 days of worsening shortness of breath.  The patient states that she had URI type symptoms with chest and head congestion and worsening nonproductive cough about 3 weeks prior to this admission.  She states that she initially got some white improvement, but has gradually gotten worse, particularly in the past 2 days prior to admission.  She endorses worsening dyspnea on exertion.  She checked her oxygen saturation at home on her usual 4 L and it has decreased to the 70% range.  As result, she presented for further evaluation and treatment.  She denies any fevers, chills, chest pain, nausea, vomiting, diarrhea.  She has some occasional loose stools.  There is no hematochezia or melena.  There is no abdominal pain.  There is no hemoptysis.  Her cough is largely nonproductive.  She complains of a lot of head congestion.   The patient quit smoking over 10 years ago after over 30-pack-year history. In the ED, the patient was afebrile and hemodynamically stable with oxygen saturation 97% on 4 L.  WBC 9.4, hemoglobin 12.9, platelets 272.  Sodium 137, potassium 3.9, bicarbonate 23, serum creatinine 0.59.  Troponin <2 and then 2.  EKG showed sinus rhythm with nonspecific ST changes.  COVID-19 PCR is negative.  Chest x-ray showed hyperinflation with coarse bronchovascular markings.  The patient was started on bronchodilators and Solu-Medrol.   Assessment/Plan: Acute on chronic respiratory failure with hypoxia -Secondary to COPD exacerbation -Oxygen saturation dropped to 87% on 4 L -Wean oxygen for saturation greater 90%  COPD exacerbation -Start Brovana -Start Pulmicort -Continue DuoNebs -Continue IV  Solu-Medrol -Check viral respiratory panel -Continue empiric doxycycline -Continue Singulair -Continue Daliresp  Pyuria and dysuria -UA 21-50 WBC -Continue ceftriaxone pending culture data  OSA -Continue BiPAP nightly  Anxiety/depression -Continue Lexapro  Hypertension -Continue diltiazem          Family Communication:  no Family at bedside  Consultants:  none  Code Status:  FULL / DNR  DVT Prophylaxis:  [place] Heparin / [place] Lovenox   Procedures: As Listed in Progress Note Above  Antibiotics: None  RN Pressure Injury Documentation:        Subjective: Pt states breathing is about the same.  Denies f/c, cp, n/v/d, abd pain  Objective: Vitals:   02/21/23 0936 02/21/23 0937 02/21/23 0942 02/21/23 1215  BP:    134/60  Pulse:    99  Resp:    17  Temp:    98.1 F (36.7 C)  TempSrc:      SpO2: 99% 100% 100% 97%  Weight:      Height:        Intake/Output Summary (Last 24 hours) at 02/21/2023 1441 Last data filed at 02/21/2023 0400 Gross per 24 hour  Intake 100 ml  Output --  Net 100 ml   Weight change:  Exam:  General:  Pt is alert, follows commands appropriately, not in acute distress HEENT: No icterus, No thrush, No neck mass, [place]/AT Cardiovascular: RRR, S1/S2, no rubs, no gallops Respiratory: diminished BS.  bibase Abdomen: Soft/+BS, non tender, non distended, no guarding Extremities: No edema, No lymphangitis, No petechiae, No rashes, no synovitis   Data Reviewed: I have personally reviewed following  labs and imaging studies Basic Metabolic Panel: Recent Labs  Lab 02/20/23 1121 02/21/23 0430  NA 138 137  K 3.9 3.9  CL 95* 96*  CO2 34* 33*  GLUCOSE 134* 124*  BUN 15 17  CREATININE 0.68 0.59  CALCIUM 8.7* 8.4*   Liver Function Tests: No results for input(s): "AST", "ALT", "ALKPHOS", "BILITOT", "PROT", "ALBUMIN" in the last 168 hours. No results for input(s): "LIPASE", "AMYLASE" in the last 168 hours. No results for input(s):  "AMMONIA" in the last 168 hours. Coagulation Profile: No results for input(s): "INR", "PROTIME" in the last 168 hours. CBC: Recent Labs  Lab 02/20/23 1121 02/21/23 0430  WBC 9.4 8.3  NEUTROABS 6.3  --   HGB 12.9 11.2*  HCT 39.0 34.7*  MCV 96.8 98.0  PLT 310 272   Cardiac Enzymes: No results for input(s): "CKTOTAL", "CKMB", "CKMBINDEX", "TROPONINI" in the last 168 hours. BNP: Invalid input(s): "POCBNP" CBG: No results for input(s): "GLUCAP" in the last 168 hours. HbA1C: No results for input(s): "HGBA1C" in the last 72 hours. Urine analysis:    Component Value Date/Time   COLORURINE STRAW (A) 02/20/2023 1050   APPEARANCEUR CLEAR 02/20/2023 1050   LABSPEC 1.004 (L) 02/20/2023 1050   PHURINE 6.0 02/20/2023 1050   GLUCOSEU NEGATIVE 02/20/2023 1050   HGBUR MODERATE (A) 02/20/2023 1050   BILIRUBINUR NEGATIVE 02/20/2023 1050   KETONESUR NEGATIVE 02/20/2023 1050   PROTEINUR NEGATIVE 02/20/2023 1050   UROBILINOGEN 0.2 01/20/2015 1957   NITRITE NEGATIVE 02/20/2023 1050   LEUKOCYTESUR SMALL (A) 02/20/2023 1050   Sepsis Labs: @LABRCNTIP (procalcitonin:4,lacticidven:4) ) Recent Results (from the past 240 hour(s))  SARS Coronavirus 2 by RT PCR (hospital order, performed in [facility] hospital lab) *cepheid single result test* Anterior Nasal Swab     Status: None   Collection Time: 02/20/23 10:54 AM   Specimen: Anterior Nasal Swab  Result Value Ref Range Status   SARS Coronavirus 2 by RT PCR NEGATIVE NEGATIVE Final    Comment: (NOTE) SARS-CoV-2 target nucleic acids are NOT DETECTED.  The SARS-CoV-2 RNA is generally detectable in upper and lower respiratory specimens during the acute phase of infection. The lowest concentration of SARS-CoV-2 viral copies this assay can detect is 250 copies / mL. A negative result does not preclude SARS-CoV-2 infection and should not be used as the sole basis for treatment or other patient management decisions.  A negative result may occur  with improper specimen collection / handling, submission of specimen other than nasopharyngeal swab, presence of viral mutation(s) within the areas targeted by this assay, and inadequate number of viral copies (<250 copies / mL). A negative result must be combined with clinical observations, patient history, and epidemiological information.  Fact Sheet for Patients:   RoadLapTop.co.za  Fact Sheet for Healthcare Providers: http://kim-miller.com/  This test is not yet approved or  cleared by the [place] FDA and has been authorized for detection and/or diagnosis of SARS-CoV-2 by FDA under an Emergency Use Authorization (EUA).  This EUA will remain in effect (meaning this test can be used) for the duration of the COVID-19 declaration under Section 564(b)(1) of the Act, 21 U.S.C. section 360bbb-3(b)(1), unless the authorization is terminated or revoked sooner.  Performed at [facility], [address], [place], [place] [address]      Scheduled Meds:  arformoterol  15 mcg Nebulization BID   budesonide (PULMICORT) nebulizer solution  0.5 mg Nebulization BID   diltiazem  240 mg Oral Daily   doxycycline  100 mg Oral Q12H  enoxaparin (LOVENOX) injection  40 mg Subcutaneous Q24H   escitalopram  20 mg Oral Daily   [START ON 02/22/2023] influenza vaccine adjuvanted  0.5 mL Intramuscular Tomorrow-1000   ipratropium-albuterol  3 mL Nebulization TID   loratadine  10 mg Oral Daily   methylPREDNISolone (SOLU-MEDROL) injection  60 mg Intravenous Q12H   Followed by   Melene Muller ON 02/22/2023] predniSONE  40 mg Oral Q breakfast   montelukast  10 mg Oral QHS   oxybutynin  5 mg Oral Daily   roflumilast  500 mcg Oral Daily   valACYclovir  500 mg Oral Daily   Continuous Infusions:  cefTRIAXone (ROCEPHIN)  IV 1 g (02/20/23 1659)    Procedures/Studies: DG Chest 2 View  Result Date: 02/20/2023 CLINICAL DATA:  Shortness of breath.  History of COPD.  EXAM: CHEST - 2 VIEW COMPARISON:  10/14/2022. FINDINGS: Redemonstration of extensive subcentimeter calcified granulomas throughout bilateral lungs. Bilateral lungs appear hyperexpanded and hyperlucent with coarse bronchovascular markings, in keeping with COPD. Bilateral lungs otherwise appear clear. No dense consolidation or lung collapse. Bilateral costophrenic angles are clear. Normal cardio-mediastinal silhouette. No acute osseous abnormalities. The soft tissues are within normal limits. IMPRESSION: 1. No radiographic evidence of acute cardiopulmonary disease. 2. COPD. Electronically Signed   By: Jules Schick M.D.   On: 02/20/2023 13:00    Catarina Hartshorn, DO  [facility]  If 7PM-7AM, please contact night-coverage [email or web address] Password [email or web address] 02/21/2023, 2:41 PM   LOS: 0 days

## 2023-02-21 NOTE — Plan of Care (Signed)
[facility] Tonsillectomy/Adenoidectomy, Postoperative PEDS care plan entered in error.

## 2023-02-22 DIAGNOSIS — J441 Chronic obstructive pulmonary disease with (acute) exacerbation: Secondary | ICD-10-CM | POA: Diagnosis not present

## 2023-02-22 DIAGNOSIS — N3000 Acute cystitis without hematuria: Secondary | ICD-10-CM | POA: Diagnosis not present

## 2023-02-22 DIAGNOSIS — J9621 Acute and chronic respiratory failure with hypoxia: Secondary | ICD-10-CM | POA: Diagnosis not present

## 2023-02-22 LAB — RESPIRATORY PANEL BY PCR

## 2023-02-22 LAB — URINE CULTURE: Culture: 20000 — AB

## 2023-02-22 LAB — CBC
HCT: 35.7 % — ABNORMAL LOW (ref 36.0–46.0)
Hemoglobin: 11.4 g/dL — ABNORMAL LOW (ref 12.0–15.0)
MCH: 31.5 pg (ref 26.0–34.0)
MCHC: 31.9 g/dL (ref 30.0–36.0)
MCV: 98.6 fL (ref 80.0–100.0)
Platelets: 289 10*3/uL (ref 150–400)
RBC: 3.62 MIL/uL — ABNORMAL LOW (ref 3.87–5.11)
RDW: 12.3 % (ref 11.5–15.5)
WBC: 9.5 10*3/uL (ref 4.0–10.5)
nRBC: 0 % (ref 0.0–0.2)

## 2023-02-22 LAB — BASIC METABOLIC PANEL
Anion gap: 10 (ref 5–15)
BUN: 15 mg/dL (ref 8–23)
CO2: 31 mmol/L (ref 22–32)
Calcium: 8.7 mg/dL — ABNORMAL LOW (ref 8.9–10.3)
Chloride: 97 mmol/L — ABNORMAL LOW (ref 98–111)
Creatinine, Ser: 0.64 mg/dL (ref 0.44–1.00)
GFR, Estimated: >60 mL/min (ref >60)
Glucose, Bld: 211 mg/dL — ABNORMAL HIGH (ref 70–99)
Potassium: 4.3 mmol/L (ref 3.5–5.1)
Sodium: 138 mmol/L (ref 135–145)

## 2023-02-22 LAB — PROCALCITONIN: Procalcitonin: <0.1 ng/mL

## 2023-02-22 LAB — D-DIMER, QUANTITATIVE: D-Dimer, Quant: <0.27 ug{FEU}/mL (ref 0.00–0.50)

## 2023-02-22 LAB — PHOSPHORUS: Phosphorus: 3 mg/dL (ref 2.5–4.6)

## 2023-02-22 LAB — MAGNESIUM: Magnesium: 1.8 mg/dL (ref 1.7–2.4)

## 2023-02-22 LAB — BRAIN NATRIURETIC PEPTIDE: B Natriuretic Peptide: 31 pg/mL (ref 0.0–100.0)

## 2023-02-22 MED ORDER — ALBUTEROL SULFATE (2.5 MG/3ML) 0.083% IN NEBU
2.5000 mg | INHALATION_SOLUTION | Freq: Three times a day (TID) | RESPIRATORY_TRACT | Status: DC
  Administered 2023-02-22 – 2023-02-24 (×6): 2.5 mg via RESPIRATORY_TRACT
  Filled 2023-02-22 (×6): qty 3

## 2023-02-22 MED ORDER — CEPHALEXIN 500 MG PO CAPS
500.0000 mg | ORAL_CAPSULE | Freq: Three times a day (TID) | ORAL | Status: DC
  Administered 2023-02-22 – 2023-02-24 (×6): 500 mg via ORAL
  Filled 2023-02-22: qty 2
  Filled 2023-02-22 (×5): qty 1

## 2023-02-22 MED ORDER — OXYMETAZOLINE HCL 0.05 % NA SOLN
1.0000 | Freq: Two times a day (BID) | NASAL | Status: DC
  Administered 2023-02-23 – 2023-02-24 (×4): 1 via NASAL
  Filled 2023-02-22: qty 30

## 2023-02-22 MED ORDER — GUAIFENESIN ER 600 MG PO TB12
1200.0000 mg | ORAL_TABLET | Freq: Two times a day (BID) | ORAL | Status: DC
  Administered 2023-02-22 – 2023-02-24 (×4): 1200 mg via ORAL
  Filled 2023-02-22 (×4): qty 2

## 2023-02-22 MED ORDER — ALBUTEROL SULFATE (2.5 MG/3ML) 0.083% IN NEBU: 2.5000 mg | INHALATION_SOLUTION | Freq: Three times a day (TID) | RESPIRATORY_TRACT | Status: DC

## 2023-02-22 NOTE — Progress Notes (Signed)
PROGRESS NOTE  [name] ZOX:[id number] DOB: [birth year] DOA: 02/20/2023 PCP: Carylon Perches, MD  Brief History:  67 year old female with a history of COPD, chronic respiratory failure on 4 L, anxiety, SVT, impaired glucose tolerance, and nonspecific neuromuscular disorder presenting with 2 days of worsening shortness of breath.  The patient states that she had URI type symptoms with chest and head congestion and worsening nonproductive cough about 3 weeks prior to this admission.  She states that she initially got some white improvement, but has gradually gotten worse, particularly in the past 2 days prior to admission.  She endorses worsening dyspnea on exertion.  She checked her oxygen saturation at home on her usual 4 L and it has decreased to the 70% range.  As result, she presented for further evaluation and treatment.  She denies any fevers, chills, chest pain, nausea, vomiting, diarrhea.  She has some occasional loose stools.  There is no hematochezia or melena.  There is no abdominal pain.  There is no hemoptysis.  Her cough is largely nonproductive.  She complains of a lot of head congestion.   The patient quit smoking over 10 years ago after over 30-pack-year history. In the ED, the patient was afebrile and hemodynamically stable with oxygen saturation 97% on 4 L.  WBC 9.4, hemoglobin 12.9, platelets 272.  Sodium 137, potassium 3.9, bicarbonate 23, serum creatinine 0.59.  Troponin <2 and then 2.  EKG showed sinus rhythm with nonspecific ST changes.  COVID-19 PCR is negative.  Chest x-ray showed hyperinflation with coarse bronchovascular markings.  The patient was started on bronchodilators and Solu-Medrol.   Assessment/Plan: Acute on chronic respiratory failure with hypoxia -Secondary to COPD exacerbation -Oxygen saturation dropped to 87% on 4 L -Wean oxygen for saturation greater 90% -remains sob with mild exertion -suspect a degree of deconditioning and poor pulmonary  reserve -Echo -D-dimer   COPD exacerbation -continue Brovana -continue Pulmicort -Continue albuterol -started Yupelri -Continue IV Solu-Medrol -Check viral respiratory panel--neg -COVID-19 PCR neg -Continue empiric doxycycline -Continue Singulair -Continue Daliresp   UTI--E-coli -UA 21-50 WBC -Continue ceftriaxone>>cephalexin   OSA -Continue BiPAP nightly   Anxiety/depression -Continue Lexapro   Hypertension -Continue diltiazem -controlled                   Family Communication:  no Family at bedside   Consultants:  none   Code Status:  FULL   DVT Prophylaxis:   [place] Lovenox     Procedures: As Listed in Progress Note Above   Antibiotics: Ceftriaxone Cephalexin 10/19>> Doxy 10/17>>        Subjective:  Pt states she remains sob with minimal exertion.  Denies f/c, cp, n/v/d, abd pain.  Has dry cough Objective: Vitals:   02/22/23 0358 02/22/23 0742 02/22/23 0747 02/22/23 0758  BP: 121/69     Pulse: 87     Resp: 20     Temp: (!) 97.5 F (36.4 C)     TempSrc: Oral     SpO2: 99% 99% 99% 99%  Weight:      Height:        Intake/Output Summary (Last 24 hours) at 02/22/2023 1415 Last data filed at 02/21/2023 2115 Gross per 24 hour  Intake 480 ml  Output --  Net 480 ml   Weight change:  Exam:  General:  Pt is alert, follows commands appropriately, not in acute distress HEENT: No icterus, No thrush, No neck mass, [place]/AT Cardiovascular: RRR,  S1/S2, no rubs, no gallops Respiratory: bibasilar rales.  Diminished BS.  Minimal basilar wheeze Abdomen: Soft/+BS, non tender, non distended, no guarding Extremities: No edema, No lymphangitis, No petechiae, No rashes, no synovitis   Data Reviewed: I have personally reviewed following labs and imaging studies Basic Metabolic Panel: Recent Labs  Lab 02/20/23 1121 02/21/23 0430 02/22/23 0351  NA 138 137 138  K 3.9 3.9 4.3  CL 95* 96* 97*  CO2 34* 33* 31  GLUCOSE 134* 124* 211*  BUN 15 17  15   CREATININE 0.68 0.59 0.64  CALCIUM 8.7* 8.4* 8.7*  MG  --   --  1.8  PHOS  --   --  3.0   Liver Function Tests: No results for input(s): "AST", "ALT", "ALKPHOS", "BILITOT", "PROT", "ALBUMIN" in the last 168 hours. No results for input(s): "LIPASE", "AMYLASE" in the last 168 hours. No results for input(s): "AMMONIA" in the last 168 hours. Coagulation Profile: No results for input(s): "INR", "PROTIME" in the last 168 hours. CBC: Recent Labs  Lab 02/20/23 1121 02/21/23 0430 02/22/23 0351  WBC 9.4 8.3 9.5  NEUTROABS 6.3  --   --   HGB 12.9 11.2* 11.4*  HCT 39.0 34.7* 35.7*  MCV 96.8 98.0 98.6  PLT 310 272 289   Cardiac Enzymes: No results for input(s): "CKTOTAL", "CKMB", "CKMBINDEX", "TROPONINI" in the last 168 hours. BNP: Invalid input(s): "POCBNP" CBG: No results for input(s): "GLUCAP" in the last 168 hours. HbA1C: No results for input(s): "HGBA1C" in the last 72 hours. Urine analysis:    Component Value Date/Time   COLORURINE STRAW (A) 02/20/2023 1050   APPEARANCEUR CLEAR 02/20/2023 1050   LABSPEC 1.004 (L) 02/20/2023 1050   PHURINE 6.0 02/20/2023 1050   GLUCOSEU NEGATIVE 02/20/2023 1050   HGBUR MODERATE (A) 02/20/2023 1050   BILIRUBINUR NEGATIVE 02/20/2023 1050   KETONESUR NEGATIVE 02/20/2023 1050   PROTEINUR NEGATIVE 02/20/2023 1050   UROBILINOGEN 0.2 01/20/2015 1957   NITRITE NEGATIVE 02/20/2023 1050   LEUKOCYTESUR SMALL (A) 02/20/2023 1050   Sepsis Labs: @LABRCNTIP (procalcitonin:4,lacticidven:4) ) Recent Results (from the past 240 hour(s))  Urine Culture (for pregnant, neutropenic or urologic patients or patients with an indwelling urinary catheter)     Status: Abnormal   Collection Time: 02/20/23 10:50 AM   Specimen: Urine, Clean Catch  Result Value Ref Range Status   Specimen Description   Final    URINE, CLEAN CATCH Performed at [facility] [address] [place], [place] [address]    Special Requests   Final    NONE Performed at [facility], [address], [place], [place] [address]    Culture 20,000 COLONIES/mL ESCHERICHIA COLI (A)  Final   Report Status 02/22/2023 FINAL  Final   Organism ID, Bacteria ESCHERICHIA COLI (A)  Final      Susceptibility   Escherichia coli - MIC*    AMPICILLIN >=32 RESISTANT Resistant     CEFAZOLIN <=4 SENSITIVE Sensitive     CEFEPIME <=0.12 SENSITIVE Sensitive     CEFTRIAXONE <=0.25 SENSITIVE Sensitive     CIPROFLOXACIN <=0.25 SENSITIVE Sensitive     GENTAMICIN <=1 SENSITIVE Sensitive     IMIPENEM <=0.25 SENSITIVE Sensitive     NITROFURANTOIN <=16 SENSITIVE Sensitive     TRIMETH/SULFA <=20 SENSITIVE Sensitive     AMPICILLIN/SULBACTAM 16 INTERMEDIATE Intermediate     PIP/TAZO <=4 SENSITIVE Sensitive ug/mL    * 20,000 COLONIES/mL ESCHERICHIA COLI  SARS Coronavirus 2 by RT PCR (hospital order, performed in [facility] hospital lab) *cepheid  single result test* Anterior Nasal Swab     Status: None   Collection Time: 02/20/23 10:54 AM   Specimen: Anterior Nasal Swab  Result Value Ref Range Status   SARS Coronavirus 2 by RT PCR NEGATIVE NEGATIVE Final    Comment: (NOTE) SARS-CoV-2 target nucleic acids are NOT DETECTED.  The SARS-CoV-2 RNA is generally detectable in upper and lower respiratory specimens during the acute phase of infection. The lowest concentration of SARS-CoV-2 viral copies this assay can detect is 250 copies / mL. A negative result does not preclude SARS-CoV-2 infection and should not be used as the sole basis for treatment or other patient management decisions.  A negative result may occur with improper specimen collection / handling, submission of specimen other than nasopharyngeal swab, presence of viral mutation(s) within the areas targeted by this assay, and inadequate number of viral copies (<250 copies / mL). A negative result must be combined with clinical observations, patient history, and epidemiological information.  Fact Sheet for Patients:    RoadLapTop.co.za  Fact Sheet for Healthcare Providers: http://kim-miller.com/  This test is not yet approved or  cleared by the [place] FDA and has been authorized for detection and/or diagnosis of SARS-CoV-2 by FDA under an Emergency Use Authorization (EUA).  This EUA will remain in effect (meaning this test can be used) for the duration of the COVID-19 declaration under Section 564(b)(1) of the Act, 21 U.S.C. section 360bbb-3(b)(1), unless the authorization is terminated or revoked sooner.  Performed at [facility], [address], [place], [place] [address]   Respiratory (~20 pathogens) panel by PCR     Status: None   Collection Time: 02/21/23  3:35 PM   Specimen: Nasopharyngeal Swab; Respiratory  Result Value Ref Range Status   Adenovirus NOT DETECTED NOT DETECTED Final   Coronavirus 229E NOT DETECTED NOT DETECTED Final    Comment: (NOTE) The Coronavirus on the Respiratory Panel, DOES NOT test for the novel  Coronavirus (2019 nCoV)    Coronavirus HKU1 NOT DETECTED NOT DETECTED Final   Coronavirus NL63 NOT DETECTED NOT DETECTED Final   Coronavirus OC43 NOT DETECTED NOT DETECTED Final   Metapneumovirus NOT DETECTED NOT DETECTED Final   Rhinovirus / Enterovirus NOT DETECTED NOT DETECTED Final   Influenza A NOT DETECTED NOT DETECTED Final   Influenza B NOT DETECTED NOT DETECTED Final   Parainfluenza Virus 1 NOT DETECTED NOT DETECTED Final   Parainfluenza Virus 2 NOT DETECTED NOT DETECTED Final   Parainfluenza Virus 3 NOT DETECTED NOT DETECTED Final   Parainfluenza Virus 4 NOT DETECTED NOT DETECTED Final   Respiratory Syncytial Virus NOT DETECTED NOT DETECTED Final   Bordetella pertussis NOT DETECTED NOT DETECTED Final   Bordetella Parapertussis NOT DETECTED NOT DETECTED Final   Chlamydophila pneumoniae NOT DETECTED NOT DETECTED Final   Mycoplasma pneumoniae NOT DETECTED NOT DETECTED Final    Comment: Performed at [facility], [address] [place], [place] [address]     Scheduled Meds:  arformoterol  15 mcg Nebulization BID   budesonide (PULMICORT) nebulizer solution  0.5 mg Nebulization BID   diltiazem  240 mg Oral Daily   doxycycline  100 mg Oral Q12H   enoxaparin (LOVENOX) injection  40 mg Subcutaneous Q24H   escitalopram  20 mg Oral Daily   loratadine  10 mg Oral Daily   methylPREDNISolone (SOLU-MEDROL) injection  60 mg Intravenous Q12H   montelukast  10 mg Oral QHS   oxybutynin  5 mg Oral Daily  revefenacin  175 mcg Nebulization Daily   roflumilast  500 mcg Oral Daily   valACYclovir  500 mg Oral Daily   Continuous Infusions:  cefTRIAXone (ROCEPHIN)  IV 1 g (02/21/23 1843)    Procedures/Studies: DG Chest 2 View  Result Date: 02/20/2023 CLINICAL DATA:  Shortness of breath.  History of COPD. EXAM: CHEST - 2 VIEW COMPARISON:  10/14/2022. FINDINGS: Redemonstration of extensive subcentimeter calcified granulomas throughout bilateral lungs. Bilateral lungs appear hyperexpanded and hyperlucent with coarse bronchovascular markings, in keeping with COPD. Bilateral lungs otherwise appear clear. No dense consolidation or lung collapse. Bilateral costophrenic angles are clear. Normal cardio-mediastinal silhouette. No acute osseous abnormalities. The soft tissues are within normal limits. IMPRESSION: 1. No radiographic evidence of acute cardiopulmonary disease. 2. COPD. Electronically Signed   By: Jules Schick M.D.   On: 02/20/2023 13:00    Catarina Hartshorn, DO  [facility]  If 7PM-7AM, please contact night-coverage [email or web address] Password [email or web address] 02/22/2023, 2:15 PM   LOS: 1 day

## 2023-02-23 ENCOUNTER — Other Ambulatory Visit (HOSPITAL_COMMUNITY): Payer: Self-pay | Admitting: *Deleted

## 2023-02-23 ENCOUNTER — Inpatient Hospital Stay (HOSPITAL_COMMUNITY): Payer: Medicare Other

## 2023-02-23 DIAGNOSIS — N3000 Acute cystitis without hematuria: Secondary | ICD-10-CM | POA: Diagnosis not present

## 2023-02-23 DIAGNOSIS — R0609 Other forms of dyspnea: Secondary | ICD-10-CM

## 2023-02-23 DIAGNOSIS — J441 Chronic obstructive pulmonary disease with (acute) exacerbation: Secondary | ICD-10-CM | POA: Diagnosis not present

## 2023-02-23 DIAGNOSIS — J9621 Acute and chronic respiratory failure with hypoxia: Secondary | ICD-10-CM | POA: Diagnosis not present

## 2023-02-23 LAB — C DIFFICILE (CDIFF) QUICK SCRN (NO PCR REFLEX)
C Diff antigen: NEGATIVE
C Diff interpretation: NOT DETECTED
C Diff toxin: NEGATIVE

## 2023-02-23 LAB — ECHOCARDIOGRAM COMPLETE
Area-P 1/2: 5.02 cm2
Height: 62 in
S' Lateral: 3 cm
Weight: 2080 [oz_av]

## 2023-02-23 MED ORDER — HYDROCORTISONE ACETATE 25 MG RE SUPP
25.0000 mg | Freq: Two times a day (BID) | RECTAL | Status: DC
  Administered 2023-02-23 – 2023-02-24 (×2): 25 mg via RECTAL
  Filled 2023-02-23 (×2): qty 1

## 2023-02-23 NOTE — Progress Notes (Signed)
PROGRESS NOTE  Cynthia KOENEMAN AYT:[id number] DOB: 1956-03-03 DOA: 02/20/2023 PCP: Carylon Perches, MD  Brief History:  67 year old female with a history of COPD, chronic respiratory failure on 4 L, anxiety, SVT, impaired glucose tolerance, and nonspecific neuromuscular disorder presenting with 2 days of worsening shortness of breath.  The patient states that she had URI type symptoms with chest and head congestion and worsening nonproductive cough about 3 weeks prior to this admission.  She states that she initially got some white improvement, but has gradually gotten worse, particularly in the past 2 days prior to admission.  She endorses worsening dyspnea on exertion.  She checked her oxygen saturation at home on her usual 4 L and it has decreased to the 70% range.  As result, she presented for further evaluation and treatment.  She denies any fevers, chills, chest pain, nausea, vomiting, diarrhea.  She has some occasional loose stools.  There is no hematochezia or melena.  There is no abdominal pain.  There is no hemoptysis.  Her cough is largely nonproductive.  She complains of a lot of head congestion.   The patient quit smoking over 10 years ago after over 30-pack-year history. In the ED, the patient was afebrile and hemodynamically stable with oxygen saturation 97% on 4 L.  WBC 9.4, hemoglobin 12.9, platelets 272.  Sodium 137, potassium 3.9, bicarbonate 23, serum creatinine 0.59.  Troponin <2 and then 2.  EKG showed sinus rhythm with nonspecific ST changes.  COVID-19 PCR is negative.  Chest x-ray showed hyperinflation with coarse bronchovascular markings.  The patient was started on bronchodilators and Solu-Medrol.   Assessment/Plan: Acute on chronic respiratory failure with hypoxia -Secondary to COPD exacerbation -Oxygen saturation dropped to 87% on 4 L -Wean oxygen for saturation greater 90% -remains sob with mild exertion -suspect a degree of deconditioning and poor pulmonary  reserve -Echo -D-dimer <0.27   COPD exacerbation -continue Brovana -continue Pulmicort -Continue albuterol -started Yupelri -Continue IV Solu-Medrol -Check viral respiratory panel--neg -COVID-19 PCR neg -Continue empiric doxycycline -Continue Singulair -Continue Daliresp   UTI--E-coli -UA 21-50 WBC -Continue ceftriaxone>>cephalexin   OSA -Continue BiPAP nightly   Anxiety/depression -Continue Lexapro   Hypertension -Continue diltiazem -controlled                   Family Communication:  no Family at bedside   Consultants:  none   Code Status:  FULL   DVT Prophylaxis:   [place] Lovenox     Procedures: As Listed in Progress Note Above   Antibiotics: Ceftriaxone Cephalexin 10/19>> Doxy 10/17>>        Subjective: Pt states she is breathing a little better.  She remains sob with exertion, but it is a little better.  Has largely dry cough.  Denies f/c, cp, n/v/d, abd pain  Objective: Vitals:   02/23/23 0749 02/23/23 0753 02/23/23 0847 02/23/23 1406  BP:      Pulse:      Resp:      Temp:      TempSrc:      SpO2: 99% 99% 99% 100%  Weight:      Height:        Intake/Output Summary (Last 24 hours) at 02/23/2023 1413 Last data filed at 02/23/2023 1000 Gross per 24 hour  Intake 340 ml  Output --  Net 340 ml   Weight change:  Exam:  General:  Pt is alert, follows commands appropriately, not in acute distress HEENT:  No icterus, No thrush, No neck mass, [place]/AT Cardiovascular: RRR, S1/S2, no rubs, no gallops Respiratory: bibasilar rales.  No wheeze Abdomen: Soft/+BS, non tender, non distended, no guarding Extremities: No edema, No lymphangitis, No petechiae, No rashes, no synovitis   Data Reviewed: I have personally reviewed following labs and imaging studies Basic Metabolic Panel: Recent Labs  Lab 02/20/23 1121 02/21/23 0430 02/22/23 0351  NA 138 137 138  K 3.9 3.9 4.3  CL 95* 96* 97*  CO2 34* 33* 31  GLUCOSE 134* 124* 211*  BUN 15  17 15   CREATININE 0.68 0.59 0.64  CALCIUM 8.7* 8.4* 8.7*  MG  --   --  1.8  PHOS  --   --  3.0   Liver Function Tests: No results for input(s): "AST", "ALT", "ALKPHOS", "BILITOT", "PROT", "ALBUMIN" in the last 168 hours. No results for input(s): "LIPASE", "AMYLASE" in the last 168 hours. No results for input(s): "AMMONIA" in the last 168 hours. Coagulation Profile: No results for input(s): "INR", "PROTIME" in the last 168 hours. CBC: Recent Labs  Lab 02/20/23 1121 02/21/23 0430 02/22/23 0351  WBC 9.4 8.3 9.5  NEUTROABS 6.3  --   --   HGB 12.9 11.2* 11.4*  HCT 39.0 34.7* 35.7*  MCV 96.8 98.0 98.6  PLT 310 272 289   Cardiac Enzymes: No results for input(s): "CKTOTAL", "CKMB", "CKMBINDEX", "TROPONINI" in the last 168 hours. BNP: Invalid input(s): "POCBNP" CBG: No results for input(s): "GLUCAP" in the last 168 hours. HbA1C: No results for input(s): "HGBA1C" in the last 72 hours. Urine analysis:    Component Value Date/Time   COLORURINE STRAW (A) 02/20/2023 1050   APPEARANCEUR CLEAR 02/20/2023 1050   LABSPEC 1.004 (L) 02/20/2023 1050   PHURINE 6.0 02/20/2023 1050   GLUCOSEU NEGATIVE 02/20/2023 1050   HGBUR MODERATE (A) 02/20/2023 1050   BILIRUBINUR NEGATIVE 02/20/2023 1050   KETONESUR NEGATIVE 02/20/2023 1050   PROTEINUR NEGATIVE 02/20/2023 1050   UROBILINOGEN 0.2 01/20/2015 1957   NITRITE NEGATIVE 02/20/2023 1050   LEUKOCYTESUR SMALL (A) 02/20/2023 1050   Sepsis Labs: @LABRCNTIP (procalcitonin:4,lacticidven:4) ) Recent Results (from the past 240 hour(s))  Urine Culture (for pregnant, neutropenic or urologic patients or patients with an indwelling urinary catheter)     Status: Abnormal   Collection Time: 02/20/23 10:50 AM   Specimen: Urine, Clean Catch  Result Value Ref Range Status   Specimen Description   Final    URINE, CLEAN CATCH Performed at [facility], [address], [place], [place] [address]    Special Requests   Final    NONE Performed at [facility], [address] [place], [place] [address]    Culture 20,000 COLONIES/mL ESCHERICHIA COLI (A)  Final   Report Status 02/22/2023 FINAL  Final   Organism ID, Bacteria ESCHERICHIA COLI (A)  Final      Susceptibility   Escherichia coli - MIC*    AMPICILLIN >=32 RESISTANT Resistant     CEFAZOLIN <=4 SENSITIVE Sensitive     CEFEPIME <=0.12 SENSITIVE Sensitive     CEFTRIAXONE <=0.25 SENSITIVE Sensitive     CIPROFLOXACIN <=0.25 SENSITIVE Sensitive     GENTAMICIN <=1 SENSITIVE Sensitive     IMIPENEM <=0.25 SENSITIVE Sensitive     NITROFURANTOIN <=16 SENSITIVE Sensitive     TRIMETH/SULFA <=20 SENSITIVE Sensitive     AMPICILLIN/SULBACTAM 16 INTERMEDIATE Intermediate     PIP/TAZO <=4 SENSITIVE Sensitive ug/mL    * 20,000 COLONIES/mL ESCHERICHIA COLI  SARS Coronavirus 2 by RT PCR (hospital order, performed  in [facility] hospital lab) *cepheid single result test* Anterior Nasal Swab     Status: None   Collection Time: 02/20/23 10:54 AM   Specimen: Anterior Nasal Swab  Result Value Ref Range Status   SARS Coronavirus 2 by RT PCR NEGATIVE NEGATIVE Final    Comment: (NOTE) SARS-CoV-2 target nucleic acids are NOT DETECTED.  The SARS-CoV-2 RNA is generally detectable in upper and lower respiratory specimens during the acute phase of infection. The lowest concentration of SARS-CoV-2 viral copies this assay can detect is 250 copies / mL. A negative result does not preclude SARS-CoV-2 infection and should not be used as the sole basis for treatment or other patient management decisions.  A negative result may occur with improper specimen collection / handling, submission of specimen other than nasopharyngeal swab, presence of viral mutation(s) within the areas targeted by this assay, and inadequate number of viral copies (<250 copies / mL). A negative result must be combined with clinical observations, patient history, and epidemiological information.  Fact Sheet for Patients:    RoadLapTop.co.za  Fact Sheet for Healthcare Providers: http://kim-miller.com/  This test is not yet approved or  cleared by the [place] FDA and has been authorized for detection and/or diagnosis of SARS-CoV-2 by FDA under an Emergency Use Authorization (EUA).  This EUA will remain in effect (meaning this test can be used) for the duration of the COVID-19 declaration under Section 564(b)(1) of the Act, 21 U.S.C. section 360bbb-3(b)(1), unless the authorization is terminated or revoked sooner.  Performed at [facility], [address] [place], [place] [address]   Respiratory (~20 pathogens) panel by PCR     Status: None   Collection Time: 02/21/23  3:35 PM   Specimen: Nasopharyngeal Swab; Respiratory  Result Value Ref Range Status   Adenovirus NOT DETECTED NOT DETECTED Final   Coronavirus 229E NOT DETECTED NOT DETECTED Final    Comment: (NOTE) The Coronavirus on the Respiratory Panel, DOES NOT test for the novel  Coronavirus (2019 nCoV)    Coronavirus HKU1 NOT DETECTED NOT DETECTED Final   Coronavirus NL63 NOT DETECTED NOT DETECTED Final   Coronavirus OC43 NOT DETECTED NOT DETECTED Final   Metapneumovirus NOT DETECTED NOT DETECTED Final   Rhinovirus / Enterovirus NOT DETECTED NOT DETECTED Final   Influenza A NOT DETECTED NOT DETECTED Final   Influenza B NOT DETECTED NOT DETECTED Final   Parainfluenza Virus 1 NOT DETECTED NOT DETECTED Final   Parainfluenza Virus 2 NOT DETECTED NOT DETECTED Final   Parainfluenza Virus 3 NOT DETECTED NOT DETECTED Final   Parainfluenza Virus 4 NOT DETECTED NOT DETECTED Final   Respiratory Syncytial Virus NOT DETECTED NOT DETECTED Final   Bordetella pertussis NOT DETECTED NOT DETECTED Final   Bordetella Parapertussis NOT DETECTED NOT DETECTED Final   Chlamydophila pneumoniae NOT DETECTED NOT DETECTED Final   Mycoplasma pneumoniae NOT DETECTED NOT DETECTED Final    Comment: Performed at [facility], [address], [place], [place] [address]     Scheduled Meds:  albuterol  2.5 mg Nebulization TID   arformoterol  15 mcg Nebulization BID   budesonide (PULMICORT) nebulizer solution  0.5 mg Nebulization BID   cephALEXin  500 mg Oral Q8H   diltiazem  240 mg Oral Daily   doxycycline  100 mg Oral Q12H   enoxaparin (LOVENOX) injection  40 mg Subcutaneous Q24H   escitalopram  20 mg Oral Daily   guaiFENesin  1,200 mg Oral BID   loratadine  10 mg  Oral Daily   methylPREDNISolone (SOLU-MEDROL) injection  60 mg Intravenous Q12H   montelukast  10 mg Oral QHS   oxybutynin  5 mg Oral Daily   oxymetazoline  1 spray Each Nare BID   revefenacin  175 mcg Nebulization Daily   roflumilast  500 mcg Oral Daily   valACYclovir  500 mg Oral Daily   Continuous Infusions:  Procedures/Studies: DG Chest 2 View  Result Date: 02/20/2023 CLINICAL DATA:  Shortness of breath.  History of COPD. EXAM: CHEST - 2 VIEW COMPARISON:  10/14/2022. FINDINGS: Redemonstration of extensive subcentimeter calcified granulomas throughout bilateral lungs. Bilateral lungs appear hyperexpanded and hyperlucent with coarse bronchovascular markings, in keeping with COPD. Bilateral lungs otherwise appear clear. No dense consolidation or lung collapse. Bilateral costophrenic angles are clear. Normal cardio-mediastinal silhouette. No acute osseous abnormalities. The soft tissues are within normal limits. IMPRESSION: 1. No radiographic evidence of acute cardiopulmonary disease. 2. COPD. Electronically Signed   By: Jules Schick M.D.   On: 02/20/2023 13:00    Catarina Hartshorn, DO  [facility]  If 7PM-7AM, please contact night-coverage [email or web address] Password [email or web address] 02/23/2023, 2:13 PM   LOS: 2 days

## 2023-02-23 NOTE — Progress Notes (Signed)
  Echocardiogram 2D Echocardiogram has been performed.  Delcie Roch 02/23/2023, 12:00 PM

## 2023-02-24 DIAGNOSIS — J441 Chronic obstructive pulmonary disease with (acute) exacerbation: Secondary | ICD-10-CM | POA: Diagnosis not present

## 2023-02-24 DIAGNOSIS — N3000 Acute cystitis without hematuria: Secondary | ICD-10-CM | POA: Diagnosis not present

## 2023-02-24 DIAGNOSIS — J9621 Acute and chronic respiratory failure with hypoxia: Secondary | ICD-10-CM | POA: Diagnosis not present

## 2023-02-24 LAB — CBC
HCT: 37.3 % (ref 36.0–46.0)
Hemoglobin: 11.9 g/dL — ABNORMAL LOW (ref 12.0–15.0)
MCH: 31.6 pg (ref 26.0–34.0)
MCHC: 31.9 g/dL (ref 30.0–36.0)
MCV: 98.9 fL (ref 80.0–100.0)
Platelets: 319 10*3/uL (ref 150–400)
RBC: 3.77 MIL/uL — ABNORMAL LOW (ref 3.87–5.11)
RDW: 12.6 % (ref 11.5–15.5)
WBC: 11.7 10*3/uL — ABNORMAL HIGH (ref 4.0–10.5)
nRBC: 0 % (ref 0.0–0.2)

## 2023-02-24 LAB — BASIC METABOLIC PANEL
Anion gap: 8 (ref 5–15)
BUN: 17 mg/dL (ref 8–23)
CO2: 33 mmol/L — ABNORMAL HIGH (ref 22–32)
Calcium: 8.3 mg/dL — ABNORMAL LOW (ref 8.9–10.3)
Chloride: 95 mmol/L — ABNORMAL LOW (ref 98–111)
Creatinine, Ser: 0.61 mg/dL (ref 0.44–1.00)
GFR, Estimated: >60 mL/min (ref >60)
Glucose, Bld: 170 mg/dL — ABNORMAL HIGH (ref 70–99)
Potassium: 4.1 mmol/L (ref 3.5–5.1)
Sodium: 136 mmol/L (ref 135–145)

## 2023-02-24 MED ORDER — CEPHALEXIN 500 MG PO CAPS: 500.0000 mg | ORAL_CAPSULE | Freq: Three times a day (TID) | ORAL | 0 refills | Status: DC

## 2023-02-24 MED ORDER — PREDNISONE 10 MG PO TABS: 60.0000 mg | ORAL_TABLET | Freq: Every day | ORAL | 0 refills | Status: DC

## 2023-02-24 MED ORDER — PREDNISONE 20 MG PO TABS: 60.0000 mg | ORAL_TABLET | Freq: Every day | ORAL | Status: DC

## 2023-02-24 MED ORDER — DOXYCYCLINE HYCLATE 100 MG PO TABS: 100.0000 mg | ORAL_TABLET | Freq: Two times a day (BID) | ORAL | 0 refills | Status: DC

## 2023-02-24 NOTE — Evaluation (Addendum)
Physical Therapy Evaluation Patient Details Name: [name] MRN: [id number] DOB: [birth year] Today's Date: 02/24/2023  History of Present Illness  [name] is a 67 y.o. 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  Patient presented to the ED with complaints of persistent difficulty breathing over the past 2 weeks.  She also reports worsening of her cough which is dry.  Difficulty breathing is worse with ambulation, she becomes severely short of breath, with O2 sats dropping to 70% at home on 4 L.  No chest pain, no leg swelling or pain.  She reports urinary frequency and cloudy colored urine.   Clinical Impression  Patient functioning near baseline for functional mobility and gait other than having to take occasional standing rest breaks leaning on side rails mostly due to SOB with SpO2 dropping from 100% to 90%  with HR increasing to 110 bpm while on 4 LPM O2, other than that patient demonstrates good return for functional mobility and ambulating in room/hallways without loss of balance.  Patient SpO2 recovered to 99% and HR at 107 after resting seated at bedside.  Patient encouraged to pace her self at home and take rest breaks before becoming to exhausted with understanding acknowledged.  Plan:  Patient discharged from physical therapy to care of nursing for ambulation daily as tolerated for length of stay.          If plan is discharge home, recommend the following: Help with stairs or ramp for entrance;Assist for transportation   Can travel by private vehicle        Equipment Recommendations None recommended by PT  Recommendations for Other Services       Functional Status Assessment Patient has had a recent decline in their functional status and/or demonstrates limited ability to make significant improvements in function in a reasonable and predictable amount of time     Precautions /  Restrictions Precautions Precautions: None Restrictions Weight Bearing Restrictions: No      Mobility  Bed Mobility Overal bed mobility: Modified Independent                  Transfers Overall transfer level: Modified independent                      Ambulation/Gait Ambulation/Gait assistance: Modified independent (Device/Increase time) Gait Distance (Feet): 100 Feet Assistive device: None Gait Pattern/deviations: Decreased step length - left, Decreased stance time - right, Decreased stride length Gait velocity: decreased     General Gait Details: slightly labored cadence without loss of balance, required occasional standing rest breaks leaning on side rail due to SOB with SpO2 dropping from 100% to 90%  Stairs            Wheelchair Mobility     Tilt Bed    Modified Rankin (Stroke Patients Only)       Balance Overall balance assessment: No apparent balance deficits (not formally assessed)                                           Pertinent Vitals/Pain Pain Assessment Pain Assessment: No/denies pain    Home Living Family/patient expects to be discharged to:: Private residence Living Arrangements: Alone Available Help at Discharge: Available PRN/intermittently;Friend(s) Type of Home: House Home Access: Ramped entrance     Alternate  Level Stairs-Number of Steps: 12 Home Layout: Able to live on main level with bedroom/bathroom;Laundry or work area in basement;Two level Home Equipment: BSC/3in1;Rollator (4 wheels);Cane - single point;Grab bars - tub/shower;Shower seat      Prior Function Prior Level of Function : Independent/Modified Independent             Mobility Comments: household and short community Journalist, newspaper PRN ADLs Comments: Independent, has groceries delivered, home aid 2 x/week x 4 hours/day     Extremity/Trunk Assessment   Upper Extremity Assessment Upper Extremity Assessment:  Overall WFL for tasks assessed    Lower Extremity Assessment Lower Extremity Assessment: Overall WFL for tasks assessed    Cervical / Trunk Assessment Cervical / Trunk Assessment: Normal  Communication   Communication Communication: No apparent difficulties  Cognition Arousal: Alert Behavior During Therapy: WFL for tasks assessed/performed Overall Cognitive Status: Within Functional Limits for tasks assessed                                          General Comments      Exercises     Assessment/Plan    PT Assessment Patient does not need any further PT services  PT Problem List         PT Treatment Interventions      PT Goals (Current goals can be found in the Care Plan section)  Acute Rehab PT Goals Patient Stated Goal: return home with home aides to assist PT Goal Formulation: With patient Time For Goal Achievement: 02/24/23 Potential to Achieve Goals: Good    Frequency       Co-evaluation               AM-PAC PT "6 Clicks" Mobility  Outcome Measure Help needed turning from your back to your side while in a flat bed without using bedrails?: None Help needed moving from lying on your back to sitting on the side of a flat bed without using bedrails?: None Help needed moving to and from a bed to a chair (including a wheelchair)?: None Help needed standing up from a chair using your arms (e.g., wheelchair or bedside chair)?: None Help needed to walk in hospital room?: None Help needed climbing 3-5 steps with a railing? : A Little 6 Click Score: 23    End of Session Equipment Utilized During Treatment: Oxygen Activity Tolerance: Patient tolerated treatment well;Patient limited by fatigue Patient left: in bed;with call bell/phone within reach Nurse Communication: Mobility status PT Visit Diagnosis: Unsteadiness on feet (R26.81);Other abnormalities of gait and mobility (R26.89);Muscle weakness (generalized) (M62.81)    Time:  1478-2956 PT Time Calculation (min) (ACUTE ONLY): 24 min   Charges:   PT Evaluation $PT Eval Moderate Complexity: 1 Mod PT Treatments $Therapeutic Activity: 23-37 mins PT General Charges $$ ACUTE PT VISIT: 1 Visit         10:02 AM, 02/24/23 Ocie Bob, MPT Physical Therapist with [facility] 336 [phone] office [phone] mobile phone

## 2023-02-24 NOTE — Progress Notes (Signed)
Patient has discharge orders, discharge teaching given and no further questions at this time. Pt wheeled down to main entrance by staff via w/c.

## 2023-02-24 NOTE — Care Management Important Message (Signed)
Important Message  Patient Details  Name: [name] MRN: [id number] Date of Birth: 1955/06/09   Important Message Given:  Yes - Medicare IM     Corey Harold 02/24/2023, 10:17 AM

## 2023-02-24 NOTE — Plan of Care (Signed)
  Problem: Respiratory: Goal: Levels of oxygenation will improve Outcome: Progressing   Problem: Education: Goal: Knowledge of General Education information will improve Description: Including pain rating scale, medication(s)/side effects and non-pharmacologic comfort measures Outcome: Progressing

## 2023-02-24 NOTE — Discharge Summary (Signed)
Physician Discharge Summary   Patient: [name] MRN: [id number] DOB: Aug 29, 1955  Admit date:     02/20/2023  Discharge date: 02/24/23  Discharge Physician: Onalee Hua Hilari Wethington   PCP: Carylon Perches, MD   Recommendations at discharge:   Please follow up with primary care provider within 1-2 weeks  Please repeat BMP and CBC in one week     Hospital Course: 67 year old female with a history of COPD, chronic respiratory failure on 4 L, anxiety, SVT, impaired glucose tolerance, and nonspecific neuromuscular disorder presenting with 2 days of worsening shortness of breath.  The patient states that she had URI type symptoms with chest and head congestion and worsening nonproductive cough about 3 weeks prior to this admission.  She states that she initially got some white improvement, but has gradually gotten worse, particularly in the past 2 days prior to admission.  She endorses worsening dyspnea on exertion.  She checked her oxygen saturation at home on her usual 4 L and it has decreased to the 70% range.  As result, she presented for further evaluation and treatment.  She denies any fevers, chills, chest pain, nausea, vomiting, diarrhea.  She has some occasional loose stools.  There is no hematochezia or melena.  There is no abdominal pain.  There is no hemoptysis.  Her cough is largely nonproductive.  She complains of a lot of head congestion.   The patient quit smoking over 10 years ago after over 30-pack-year history. In the ED, the patient was afebrile and hemodynamically stable with oxygen saturation 97% on 4 L.  WBC 9.4, hemoglobin 12.9, platelets 272.  Sodium 137, potassium 3.9, bicarbonate 23, serum creatinine 0.59.  Troponin <2 and then 2.  EKG showed sinus rhythm with nonspecific ST changes.  COVID-19 PCR is negative.  Chest x-ray showed hyperinflation with coarse bronchovascular markings.  The patient was started on bronchodilators and Solu-Medrol.  Assessment and Plan: Acute on chronic  respiratory failure with hypoxia -Secondary to COPD exacerbation -Oxygen saturation dropped to 87% on 4 L -Wean oxygen for saturation greater 90% -suspect a degree of deconditioning and poor pulmonary reserve -Echo EF 70-75%, no WMA, RV hyperdynamic -D-dimer <0.27 -overall improving, albeit slow--saturation 93-94% on 4L with ambulation   COPD exacerbation -continue Brovana -continue Pulmicort -Continue albuterol -started Yupelri -Continue IV Solu-Medrol>>d/c home with prednisone taper -Check viral respiratory panel--neg -COVID-19 PCR neg -Continue empiric doxycycline--2 more days after dc -Continue Singulair -Continue Daliresp   UTI--E-coli -UA 21-50 WBC -Continue ceftriaxone>>cephalexin--d/c home with 3 more days   OSA -Continue BiPAP nightly   Anxiety/depression -Continue Lexapro   Hypertension -Continue diltiazem -controlled         Consultants: none Procedures performed: none  Disposition: Home Diet recommendation:  Cardiac diet DISCHARGE MEDICATION: Allergies as of 02/24/2023       Reactions   Ketek [telithromycin] Other (See Comments)   Loopy, eyes went different directions   Penicillins Other (See Comments)   Unknown, childhood allergy   Adhesive [tape] Itching, Rash   Break out   Chantix [varenicline] Palpitations        Medication List     TAKE these medications    albuterol 108 (90 Base) MCG/ACT inhaler Commonly known as: VENTOLIN HFA Inhale 1-2 puffs into the lungs every 6 (six) hours as needed for wheezing or shortness of breath.   atorvastatin 40 MG tablet Commonly known as: LIPITOR TAKE 1 TABLET DAILY   azelastine 0.1 % nasal spray Commonly known as: ASTELIN Place 1 spray into both  nostrils daily as needed for rhinitis or allergies. Use in each nostril as directed   carboxymethylcellulose 1 % ophthalmic solution Place 2 drops into both eyes 3 (three) times daily as needed (for dry eyes).   cephALEXin 500 MG  capsule Commonly known as: KEFLEX Take 1 capsule (500 mg total) by mouth every 8 (eight) hours.   cetirizine 10 MG tablet Commonly known as: ZYRTEC Take 10 mg by mouth daily.   diltiazem 240 MG 24 hr capsule Commonly known as: DILACOR XR Take 1 capsule (240 mg total) by mouth daily.   doxycycline 100 MG tablet Commonly known as: VIBRA-TABS Take 1 tablet (100 mg total) by mouth every 12 (twelve) hours.   escitalopram 20 MG tablet Commonly known as: LEXAPRO Take 20 mg by mouth daily.   ibuprofen 200 MG tablet Commonly known as: ADVIL Take 400 mg by mouth every 6 (six) hours as needed for moderate pain.   montelukast 10 MG tablet Commonly known as: SINGULAIR Take 1 tablet (10 mg total) by mouth at bedtime.   NON FORMULARY Pt uses a bipap with 4L of oxygen when laying down   oxybutynin 5 MG tablet Commonly known as: DITROPAN Take 5 mg by mouth daily.   OXYGEN Inhale 3.5-7 L into the lungs continuous.   predniSONE 10 MG tablet Commonly known as: DELTASONE Take 6 tablets (60 mg total) by mouth daily with breakfast. And decrease by one tablet daily Start taking on: February 25, 2023   roflumilast 500 MCG Tabs tablet Commonly known as: DALIRESP Take 500 mcg by mouth daily.   Trelegy Ellipta 100-62.5-25 MCG/ACT Aepb Generic drug: Fluticasone-Umeclidin-Vilant Inhale 1 puff into the lungs daily.   valACYclovir 500 MG tablet Commonly known as: VALTREX Take 500 mg by mouth daily.        Discharge Exam: Filed Weights   02/20/23 1046  Weight: 59 kg   HEENT:  [place]/AT, No thrush, no icterus CV:  RRR, no rub, no S3, no S4 Lung:  diminished BS.  Bibasilar crackles.  No wheeze Abd:  soft/+BS, NT Ext:  No edema, no lymphangitis, no synovitis, no rash   Condition at discharge: stable  The results of significant diagnostics from this hospitalization (including imaging, microbiology, ancillary and laboratory) are listed below for reference.   Imaging  Studies: ECHOCARDIOGRAM COMPLETE  Result Date: 02/23/2023    ECHOCARDIOGRAM REPORT   Patient Name:   [name] Date of Exam: 02/23/2023 Medical Rec #:  [id number]      Height:       62.0 in Accession #:    [id number]     Weight:       130.0 lb Date of Birth:  1955-06-28      BSA:          1.592 m Patient Age:    67 years       BP:           118/77 mmHg Patient Gender: F              HR:           89 bpm. Exam Location:  Inpatient Procedure: 2D Echo, Cardiac Doppler and Color Doppler Indications:    dyspnea  History:        Patient has prior history of Echocardiogram examinations, most                 recent 10/31/2020. COPD; Risk Factors:Dyslipidemia and Sleep  nostrils daily as needed for rhinitis or allergies. Use in each nostril as directed   carboxymethylcellulose 1 % ophthalmic solution Place 2 drops into both eyes 3 (three) times daily as needed (for dry eyes).   cephALEXin 500 MG  capsule Commonly known as: KEFLEX Take 1 capsule (500 mg total) by mouth every 8 (eight) hours.   cetirizine 10 MG tablet Commonly known as: ZYRTEC Take 10 mg by mouth daily.   diltiazem 240 MG 24 hr capsule Commonly known as: DILACOR XR Take 1 capsule (240 mg total) by mouth daily.   doxycycline 100 MG tablet Commonly known as: VIBRA-TABS Take 1 tablet (100 mg total) by mouth every 12 (twelve) hours.   escitalopram 20 MG tablet Commonly known as: LEXAPRO Take 20 mg by mouth daily.   ibuprofen 200 MG tablet Commonly known as: ADVIL Take 400 mg by mouth every 6 (six) hours as needed for moderate pain.   montelukast 10 MG tablet Commonly known as: SINGULAIR Take 1 tablet (10 mg total) by mouth at bedtime.   NON FORMULARY Pt uses a bipap with 4L of oxygen when laying down   oxybutynin 5 MG tablet Commonly known as: DITROPAN Take 5 mg by mouth daily.   OXYGEN Inhale 3.5-7 L into the lungs continuous.   predniSONE 10 MG tablet Commonly known as: DELTASONE Take 6 tablets (60 mg total) by mouth daily with breakfast. And decrease by one tablet daily Start taking on: February 25, 2023   roflumilast 500 MCG Tabs tablet Commonly known as: DALIRESP Take 500 mcg by mouth daily.   Trelegy Ellipta 100-62.5-25 MCG/ACT Aepb Generic drug: Fluticasone-Umeclidin-Vilant Inhale 1 puff into the lungs daily.   valACYclovir 500 MG tablet Commonly known as: VALTREX Take 500 mg by mouth daily.        Discharge Exam: Filed Weights   02/20/23 1046  Weight: 59 kg   HEENT:  [place]/AT, No thrush, no icterus CV:  RRR, no rub, no S3, no S4 Lung:  diminished BS.  Bibasilar crackles.  No wheeze Abd:  soft/+BS, NT Ext:  No edema, no lymphangitis, no synovitis, no rash   Condition at discharge: stable  The results of significant diagnostics from this hospitalization (including imaging, microbiology, ancillary and laboratory) are listed below for reference.   Imaging  Studies: ECHOCARDIOGRAM COMPLETE  Result Date: 02/23/2023    ECHOCARDIOGRAM REPORT   Patient Name:   [name] Date of Exam: 02/23/2023 Medical Rec #:  [id number]      Height:       62.0 in Accession #:    [id number]     Weight:       130.0 lb Date of Birth:  1955-06-28      BSA:          1.592 m Patient Age:    67 years       BP:           118/77 mmHg Patient Gender: F              HR:           89 bpm. Exam Location:  Inpatient Procedure: 2D Echo, Cardiac Doppler and Color Doppler Indications:    dyspnea  History:        Patient has prior history of Echocardiogram examinations, most                 recent 10/31/2020. COPD; Risk Factors:Dyslipidemia and Sleep  nostrils daily as needed for rhinitis or allergies. Use in each nostril as directed   carboxymethylcellulose 1 % ophthalmic solution Place 2 drops into both eyes 3 (three) times daily as needed (for dry eyes).   cephALEXin 500 MG  capsule Commonly known as: KEFLEX Take 1 capsule (500 mg total) by mouth every 8 (eight) hours.   cetirizine 10 MG tablet Commonly known as: ZYRTEC Take 10 mg by mouth daily.   diltiazem 240 MG 24 hr capsule Commonly known as: DILACOR XR Take 1 capsule (240 mg total) by mouth daily.   doxycycline 100 MG tablet Commonly known as: VIBRA-TABS Take 1 tablet (100 mg total) by mouth every 12 (twelve) hours.   escitalopram 20 MG tablet Commonly known as: LEXAPRO Take 20 mg by mouth daily.   ibuprofen 200 MG tablet Commonly known as: ADVIL Take 400 mg by mouth every 6 (six) hours as needed for moderate pain.   montelukast 10 MG tablet Commonly known as: SINGULAIR Take 1 tablet (10 mg total) by mouth at bedtime.   NON FORMULARY Pt uses a bipap with 4L of oxygen when laying down   oxybutynin 5 MG tablet Commonly known as: DITROPAN Take 5 mg by mouth daily.   OXYGEN Inhale 3.5-7 L into the lungs continuous.   predniSONE 10 MG tablet Commonly known as: DELTASONE Take 6 tablets (60 mg total) by mouth daily with breakfast. And decrease by one tablet daily Start taking on: February 25, 2023   roflumilast 500 MCG Tabs tablet Commonly known as: DALIRESP Take 500 mcg by mouth daily.   Trelegy Ellipta 100-62.5-25 MCG/ACT Aepb Generic drug: Fluticasone-Umeclidin-Vilant Inhale 1 puff into the lungs daily.   valACYclovir 500 MG tablet Commonly known as: VALTREX Take 500 mg by mouth daily.        Discharge Exam: Filed Weights   02/20/23 1046  Weight: 59 kg   HEENT:  [place]/AT, No thrush, no icterus CV:  RRR, no rub, no S3, no S4 Lung:  diminished BS.  Bibasilar crackles.  No wheeze Abd:  soft/+BS, NT Ext:  No edema, no lymphangitis, no synovitis, no rash   Condition at discharge: stable  The results of significant diagnostics from this hospitalization (including imaging, microbiology, ancillary and laboratory) are listed below for reference.   Imaging  Studies: ECHOCARDIOGRAM COMPLETE  Result Date: 02/23/2023    ECHOCARDIOGRAM REPORT   Patient Name:   [name] Date of Exam: 02/23/2023 Medical Rec #:  [id number]      Height:       62.0 in Accession #:    [id number]     Weight:       130.0 lb Date of Birth:  1955-06-28      BSA:          1.592 m Patient Age:    67 years       BP:           118/77 mmHg Patient Gender: F              HR:           89 bpm. Exam Location:  Inpatient Procedure: 2D Echo, Cardiac Doppler and Color Doppler Indications:    dyspnea  History:        Patient has prior history of Echocardiogram examinations, most                 recent 10/31/2020. COPD; Risk Factors:Dyslipidemia and Sleep  Physician Discharge Summary   Patient: [name] MRN: [id number] DOB: Aug 29, 1955  Admit date:     02/20/2023  Discharge date: 02/24/23  Discharge Physician: Onalee Hua Hilari Wethington   PCP: Carylon Perches, MD   Recommendations at discharge:   Please follow up with primary care provider within 1-2 weeks  Please repeat BMP and CBC in one week     Hospital Course: 67 year old female with a history of COPD, chronic respiratory failure on 4 L, anxiety, SVT, impaired glucose tolerance, and nonspecific neuromuscular disorder presenting with 2 days of worsening shortness of breath.  The patient states that she had URI type symptoms with chest and head congestion and worsening nonproductive cough about 3 weeks prior to this admission.  She states that she initially got some white improvement, but has gradually gotten worse, particularly in the past 2 days prior to admission.  She endorses worsening dyspnea on exertion.  She checked her oxygen saturation at home on her usual 4 L and it has decreased to the 70% range.  As result, she presented for further evaluation and treatment.  She denies any fevers, chills, chest pain, nausea, vomiting, diarrhea.  She has some occasional loose stools.  There is no hematochezia or melena.  There is no abdominal pain.  There is no hemoptysis.  Her cough is largely nonproductive.  She complains of a lot of head congestion.   The patient quit smoking over 10 years ago after over 30-pack-year history. In the ED, the patient was afebrile and hemodynamically stable with oxygen saturation 97% on 4 L.  WBC 9.4, hemoglobin 12.9, platelets 272.  Sodium 137, potassium 3.9, bicarbonate 23, serum creatinine 0.59.  Troponin <2 and then 2.  EKG showed sinus rhythm with nonspecific ST changes.  COVID-19 PCR is negative.  Chest x-ray showed hyperinflation with coarse bronchovascular markings.  The patient was started on bronchodilators and Solu-Medrol.  Assessment and Plan: Acute on chronic  respiratory failure with hypoxia -Secondary to COPD exacerbation -Oxygen saturation dropped to 87% on 4 L -Wean oxygen for saturation greater 90% -suspect a degree of deconditioning and poor pulmonary reserve -Echo EF 70-75%, no WMA, RV hyperdynamic -D-dimer <0.27 -overall improving, albeit slow--saturation 93-94% on 4L with ambulation   COPD exacerbation -continue Brovana -continue Pulmicort -Continue albuterol -started Yupelri -Continue IV Solu-Medrol>>d/c home with prednisone taper -Check viral respiratory panel--neg -COVID-19 PCR neg -Continue empiric doxycycline--2 more days after dc -Continue Singulair -Continue Daliresp   UTI--E-coli -UA 21-50 WBC -Continue ceftriaxone>>cephalexin--d/c home with 3 more days   OSA -Continue BiPAP nightly   Anxiety/depression -Continue Lexapro   Hypertension -Continue diltiazem -controlled         Consultants: none Procedures performed: none  Disposition: Home Diet recommendation:  Cardiac diet DISCHARGE MEDICATION: Allergies as of 02/24/2023       Reactions   Ketek [telithromycin] Other (See Comments)   Loopy, eyes went different directions   Penicillins Other (See Comments)   Unknown, childhood allergy   Adhesive [tape] Itching, Rash   Break out   Chantix [varenicline] Palpitations        Medication List     TAKE these medications    albuterol 108 (90 Base) MCG/ACT inhaler Commonly known as: VENTOLIN HFA Inhale 1-2 puffs into the lungs every 6 (six) hours as needed for wheezing or shortness of breath.   atorvastatin 40 MG tablet Commonly known as: LIPITOR TAKE 1 TABLET DAILY   azelastine 0.1 % nasal spray Commonly known as: ASTELIN Place 1 spray into both  nostrils daily as needed for rhinitis or allergies. Use in each nostril as directed   carboxymethylcellulose 1 % ophthalmic solution Place 2 drops into both eyes 3 (three) times daily as needed (for dry eyes).   cephALEXin 500 MG  capsule Commonly known as: KEFLEX Take 1 capsule (500 mg total) by mouth every 8 (eight) hours.   cetirizine 10 MG tablet Commonly known as: ZYRTEC Take 10 mg by mouth daily.   diltiazem 240 MG 24 hr capsule Commonly known as: DILACOR XR Take 1 capsule (240 mg total) by mouth daily.   doxycycline 100 MG tablet Commonly known as: VIBRA-TABS Take 1 tablet (100 mg total) by mouth every 12 (twelve) hours.   escitalopram 20 MG tablet Commonly known as: LEXAPRO Take 20 mg by mouth daily.   ibuprofen 200 MG tablet Commonly known as: ADVIL Take 400 mg by mouth every 6 (six) hours as needed for moderate pain.   montelukast 10 MG tablet Commonly known as: SINGULAIR Take 1 tablet (10 mg total) by mouth at bedtime.   NON FORMULARY Pt uses a bipap with 4L of oxygen when laying down   oxybutynin 5 MG tablet Commonly known as: DITROPAN Take 5 mg by mouth daily.   OXYGEN Inhale 3.5-7 L into the lungs continuous.   predniSONE 10 MG tablet Commonly known as: DELTASONE Take 6 tablets (60 mg total) by mouth daily with breakfast. And decrease by one tablet daily Start taking on: February 25, 2023   roflumilast 500 MCG Tabs tablet Commonly known as: DALIRESP Take 500 mcg by mouth daily.   Trelegy Ellipta 100-62.5-25 MCG/ACT Aepb Generic drug: Fluticasone-Umeclidin-Vilant Inhale 1 puff into the lungs daily.   valACYclovir 500 MG tablet Commonly known as: VALTREX Take 500 mg by mouth daily.        Discharge Exam: Filed Weights   02/20/23 1046  Weight: 59 kg   HEENT:  [place]/AT, No thrush, no icterus CV:  RRR, no rub, no S3, no S4 Lung:  diminished BS.  Bibasilar crackles.  No wheeze Abd:  soft/+BS, NT Ext:  No edema, no lymphangitis, no synovitis, no rash   Condition at discharge: stable  The results of significant diagnostics from this hospitalization (including imaging, microbiology, ancillary and laboratory) are listed below for reference.   Imaging  Studies: ECHOCARDIOGRAM COMPLETE  Result Date: 02/23/2023    ECHOCARDIOGRAM REPORT   Patient Name:   [name] Date of Exam: 02/23/2023 Medical Rec #:  [id number]      Height:       62.0 in Accession #:    [id number]     Weight:       130.0 lb Date of Birth:  1955-06-28      BSA:          1.592 m Patient Age:    67 years       BP:           118/77 mmHg Patient Gender: F              HR:           89 bpm. Exam Location:  Inpatient Procedure: 2D Echo, Cardiac Doppler and Color Doppler Indications:    dyspnea  History:        Patient has prior history of Echocardiogram examinations, most                 recent 10/31/2020. COPD; Risk Factors:Dyslipidemia and Sleep

## 2023-02-26 ENCOUNTER — Ambulatory Visit (INDEPENDENT_AMBULATORY_CARE_PROVIDER_SITE_OTHER): Payer: Medicare Other | Admitting: Allergy & Immunology

## 2023-02-26 ENCOUNTER — Encounter: Payer: Self-pay | Admitting: Allergy & Immunology

## 2023-02-26 ENCOUNTER — Other Ambulatory Visit: Payer: Self-pay

## 2023-02-26 VITALS — BP 120/68 | HR 110 | Temp 98.4°F | Resp 16 | Ht 62.0 in | Wt 134.2 lb

## 2023-02-26 DIAGNOSIS — J329 Chronic sinusitis, unspecified: Secondary | ICD-10-CM | POA: Diagnosis not present

## 2023-02-26 DIAGNOSIS — J449 Chronic obstructive pulmonary disease, unspecified: Secondary | ICD-10-CM | POA: Diagnosis not present

## 2023-02-26 DIAGNOSIS — J310 Chronic rhinitis: Secondary | ICD-10-CM | POA: Diagnosis not present

## 2023-02-26 MED ORDER — CARBINOXAMINE MALEATE 4 MG PO TABS: 4.0000 mg | ORAL_TABLET | Freq: Two times a day (BID) | ORAL | 5 refills | Status: AC

## 2023-02-26 NOTE — Progress Notes (Unsigned)
NEW PATIENT  Date of Service/Encounter:  02/26/23  Consult requested by: Carylon Perches, MD   Assessment:   No diagnosis found.  Plan/Recommendations:   Assessment and Plan              There are no Patient Instructions on file for this visit.   {Blank single:19197::"This note in its entirety was forwarded to the Provider who requested this consultation."}  Subjective:   [name] is a 67 y.o. female presenting today for evaluation of  Chief Complaint  Patient presents with   Allergic Rhinitis    Other    Got out the hospital on Monday - shortness of breath, runny nose, pain all over, watery eyes, felt sick and could not get better     [name] has a history of the following: Patient Active Problem List   Diagnosis Date Noted   COPD with acute exacerbation (HCC) 02/20/2023   Acute on chronic respiratory failure with hypoxia (HCC) 02/20/2023   UTI (urinary tract infection) 02/20/2023   OSA (obstructive sleep apnea) 02/20/2023   Fracture of femoral head (HCC) 07/15/2022   C. difficile colitis 04/08/2022   Abdominal pain 03/24/2022   Rectal bleeding 03/24/2022   External hemorrhoids with complication 02/11/2022   Stricture of sigmoid colon (HCC) 02/07/2022   Supplemental oxygen dependent 10/23/2021   Diverticulitis of large intestine without perforation or abscess without bleeding 10/23/2021   Gastroesophageal reflux disease 08/07/2021   Solitary pulmonary nodule 09/08/2019   Muscle weakness (generalized)    Chronic respiratory failure with hypoxia and hypercapnia (HCC) 01/21/2015   Macrocytic anemia 01/21/2015   Respiratory failure with hypercapnia (HCC) 11/14/2014   COPD exacerbation (HCC) 11/14/2014   Anemia 11/14/2014   Elevated glucose 05/24/2012   COPD Group D symptoms 12/23/2011   Hyperlipidemia 12/23/2011    History obtained from: chart review and {Persons; PED relatives w/patient:19415::"patient"}.  Discussed the use of AI scribe  software for clinical note transcription with the patient and/or guardian, who gave verbal consent to proceed.  [name] was referred by Carylon Perches, MD.     [name] is a 67 y.o. female presenting for {Blank single:19197::"a food challenge","a drug challenge","skin testing","a sick visit","an evaluation of ***","a follow up visit"}.  Discussed the use of AI scribe software for clinical note transcription with the patient, who gave verbal consent to proceed.  History of Present Illness             {Blank single:19197::"Asthma/Respiratory Symptom History: ***"," "} She follows with [name] and Marcy Salvo with Pulmonology.  She was last seen in July 2024 via video visit.  Her dyspnea on exertion had improved.  It was felt that she was deconditioned from her recent surgery.  For her COPD, she was continued on Trelegy 100 mcg 1 puff daily as well as Dalisrep 100 mcg daily and singular 10 mg nightly.  She also was on a BiPAP for her sleep apnea.  For her rhinitis, she was continued on Singulair and as needed Astelin, Zyrtec, and Flonase.  {Blank single:19197::"Allergic Rhinitis Symptom History: ***"," "}  {Blank single:19197::"Food Allergy Symptom History: ***"," "}  {Blank single:19197::"Skin Symptom History: ***"," "}  {Blank single:19197::"GERD Symptom History: ***"," "}  ***Otherwise, there is no history of other atopic diseases, including {Blank multiple:19196:o:"asthma","food allergies","drug allergies","environmental allergies","stinging insect allergies","eczema","urticaria","contact dermatitis"}. There is no significant infectious history. ***Vaccinations are up to date.    Past Medical History: Patient Active Problem List   Diagnosis Date Noted   COPD  with acute exacerbation (HCC) 02/20/2023   Acute on chronic respiratory failure with hypoxia (HCC) 02/20/2023   UTI (urinary tract infection) 02/20/2023   OSA (obstructive sleep apnea) 02/20/2023   Fracture of femoral head  (HCC) 07/15/2022   C. difficile colitis 04/08/2022   Abdominal pain 03/24/2022   Rectal bleeding 03/24/2022   External hemorrhoids with complication 02/11/2022   Stricture of sigmoid colon (HCC) 02/07/2022   Supplemental oxygen dependent 10/23/2021   Diverticulitis of large intestine without perforation or abscess without bleeding 10/23/2021   Gastroesophageal reflux disease 08/07/2021   Solitary pulmonary nodule 09/08/2019   Muscle weakness (generalized)    Chronic respiratory failure with hypoxia and hypercapnia (HCC) 01/21/2015   Macrocytic anemia 01/21/2015   Respiratory failure with hypercapnia (HCC) 11/14/2014   COPD exacerbation (HCC) 11/14/2014   Anemia 11/14/2014   Elevated glucose 05/24/2012   COPD Group D symptoms 12/23/2011   Hyperlipidemia 12/23/2011    Medication List:  Allergies as of 02/26/2023       Reactions   Ketek [telithromycin] Other (See Comments)   Loopy, eyes went different directions   Penicillins Other (See Comments)   Unknown, childhood allergy   Adhesive [tape] Itching, Rash   Break out   Chantix [varenicline] Palpitations        Medication List        Accurate as of February 26, 2023  1:49 PM. If you have any questions, ask your nurse or doctor.          albuterol 108 (90 Base) MCG/ACT inhaler Commonly known as: VENTOLIN HFA Inhale 1-2 puffs into the lungs every 6 (six) hours as needed for wheezing or shortness of breath.   atorvastatin 40 MG tablet Commonly known as: LIPITOR TAKE 1 TABLET DAILY   azelastine 0.1 % nasal spray Commonly known as: ASTELIN Place 1 spray into both nostrils daily as needed for rhinitis or allergies. Use in each nostril as directed   carboxymethylcellulose 1 % ophthalmic solution Place 2 drops into both eyes 3 (three) times daily as needed (for dry eyes).   cephALEXin 500 MG capsule Commonly known as: KEFLEX Take 1 capsule (500 mg total) by mouth every 8 (eight) hours.   cetirizine 10 MG  tablet Commonly known as: ZYRTEC Take 10 mg by mouth daily.   diltiazem 240 MG 24 hr capsule Commonly known as: DILACOR XR Take 1 capsule (240 mg total) by mouth daily.   doxycycline 100 MG tablet Commonly known as: VIBRA-TABS Take 1 tablet (100 mg total) by mouth every 12 (twelve) hours.   escitalopram 20 MG tablet Commonly known as: LEXAPRO Take 20 mg by mouth daily.   ibuprofen 200 MG tablet Commonly known as: ADVIL Take 400 mg by mouth every 6 (six) hours as needed for moderate pain.   montelukast 10 MG tablet Commonly known as: SINGULAIR Take 1 tablet (10 mg total) by mouth at bedtime.   NON FORMULARY Pt uses a bipap with 4L of oxygen when laying down   oxybutynin 5 MG tablet Commonly known as: DITROPAN Take 5 mg by mouth daily.   OXYGEN Inhale 3.5-7 L into the lungs continuous.   predniSONE 10 MG tablet Commonly known as: DELTASONE Take 6 tablets (60 mg total) by mouth daily with breakfast. And decrease by one tablet daily   roflumilast 500 MCG Tabs tablet Commonly known as: DALIRESP Take 500 mcg by mouth daily.   Trelegy Ellipta 100-62.5-25 MCG/ACT Aepb Generic drug: Fluticasone-Umeclidin-Vilant Inhale 1 puff into the lungs daily.  valACYclovir 500 MG tablet Commonly known as: VALTREX Take 500 mg by mouth daily.        Birth History: {Blank single:19197::"non-contributory","born premature and spent time in the NICU","born at term without complications"}  Developmental History: Virgene has met all milestones on time. She has required no {Blank multiple:19196:a:"speech therapy","occupational therapy","physical therapy"}. ***non-contributory  Past Surgical History: Past Surgical History:  Procedure Laterality Date   BACK SURGERY     BIOPSY  09/12/2021   Procedure: BIOPSY;  Surgeon: Malissa Hippo, MD;  [facility];  Service: Endoscopy;;   BREAST SURGERY     CARDIAC CATHETERIZATION  2004   COLONOSCOPY  03/22/2011   Procedure:  COLONOSCOPY;  Surgeon: Malissa Hippo, MD;  [facility];  Service: Endoscopy;  Laterality: N/A;   COLONOSCOPY WITH PROPOFOL N/A 05/16/2017   Procedure: COLONOSCOPY WITH PROPOFOL;  Surgeon: Malissa Hippo, MD;  [facility];  Service: Endoscopy;  Laterality: N/A;   COLONOSCOPY WITH PROPOFOL N/A 09/12/2021   Procedure: COLONOSCOPY WITH PROPOFOL;  Surgeon: Malissa Hippo, MD;  [facility];  Service: Endoscopy;  Laterality: N/A;  1230 ASA 2   ESOPHAGEAL DILATION N/A 04/19/2020   Procedure: ESOPHAGEAL DILATION;  Surgeon: Malissa Hippo, MD;  [facility];  Service: Endoscopy;  Laterality: N/A;   ESOPHAGOGASTRODUODENOSCOPY (EGD) WITH PROPOFOL N/A 04/19/2020   Procedure: ESOPHAGOGASTRODUODENOSCOPY (EGD) WITH PROPOFOL;  Surgeon: Malissa Hippo, MD;  [facility];  Service: Endoscopy;  Laterality: N/A;  9:15   ESOPHAGOGASTRODUODENOSCOPY (EGD) WITH PROPOFOL N/A 09/12/2021   Procedure: ESOPHAGOGASTRODUODENOSCOPY (EGD) WITH PROPOFOL;  Surgeon: Malissa Hippo, MD;  [facility];  Service: Endoscopy;  Laterality: N/A;   FLEXIBLE SIGMOIDOSCOPY N/A 09/26/2017   Procedure: FLEXIBLE SIGMOIDOSCOPY with Propofol ;  Surgeon: Malissa Hippo, MD;  [facility];  Service: Endoscopy;  Laterality: N/A;  9:30   FLEXIBLE SIGMOIDOSCOPY N/A 06/04/2022   Procedure: FLEXIBLE SIGMOIDOSCOPY;  Surgeon: Dolores Frame, MD;  [facility];  Service: Gastroenterology;  Laterality: N/A;  10:30am, asa 3   OSTOMY N/A 02/07/2022   Procedure: POSSIBLE OSTOMY;  Surgeon: Karie Soda, MD;  Location: [facility];  Service: General;  Laterality: N/A;   POLYPECTOMY  05/16/2017   Procedure: POLYPECTOMY;  Surgeon: Malissa Hippo, MD;  [facility];  Service: Endoscopy;;   POLYPECTOMY  09/12/2021   Procedure: POLYPECTOMY INTESTINAL;  Surgeon: Malissa Hippo, MD;  [facility];  Service: Endoscopy;;   PROCTOSCOPY  N/A 02/07/2022   Procedure: RIGID PROCTOSCOPY;  Surgeon: Karie Soda, MD;  Location: [facility];  Service: General;  Laterality: N/A;   SPINAL FUSION     TOTAL HIP ARTHROPLASTY Left 07/15/2022   Procedure: TOTAL HIP ARTHROPLASTY;  Surgeon: Joen Laura, MD;  Location: [facility];  Service: Orthopedics;  Laterality: Left;   VULVECTOMY       Family History: Family History  Problem Relation Age of Onset   Asthma Mother    Heart attack Mother    Diabetes Mother    Heart failure Mother    Colon cancer Neg Hx      Social History: Clytie lives at home with ***.  She lives in a house that is 65 years old.  There is linoleum in the main living areas and was in the bathroom.  She has a heat pump for heating and cooling.  There are dogs inside of the home.  There are dust mite covers  on the bed as well as the pillows.  There is no tobacco exposure.  She is currently retired.  There is no fume, chemical, or dust exposure.  There is no HEPA filter.  She does not live alone interstate or industrial area.   Review of systems otherwise negative other than that mentioned in the HPI.    Objective:   There were no vitals taken for this visit. There is no height or weight on file to calculate BMI.     Physical Exam   Diagnostic studies: {Blank single:19197::"none","deferred due to recent antihistamine use","deferred due to insurance stipulations that refuse to pay for testing at initial visits, making it more difficult for patients to get the care they need","labs sent instead"," "}  Spirometry: {Blank single:19197::"results normal (FEV1: ***%, FVC: ***%, FEV1/FVC: ***%)","results abnormal (FEV1: ***%, FVC: ***%, FEV1/FVC: ***%)"}.    {Blank single:19197::"Spirometry consistent with mild obstructive disease","Spirometry consistent with moderate obstructive disease","Spirometry consistent with severe obstructive disease","Spirometry consistent with possible restrictive disease","Spirometry  consistent with mixed obstructive and restrictive disease","Spirometry uninterpretable due to technique","Spirometry consistent with normal pattern"}. {Blank single:19197::"Albuterol/Atrovent nebulizer","Xopenex/Atrovent nebulizer","Albuterol nebulizer","Albuterol four puffs via MDI","Xopenex four puffs via MDI"} treatment given in clinic with {Blank single:19197::"significant improvement in FEV1 per ATS criteria","significant improvement in FVC per ATS criteria","significant improvement in FEV1 and FVC per ATS criteria","improvement in FEV1, but not significant per ATS criteria","improvement in FVC, but not significant per ATS criteria","improvement in FEV1 and FVC, but not significant per ATS criteria","no improvement"}.  Allergy Studies: {Blank single:19197::"none","labs sent instead"," "}    {Blank single:19197::"Allergy testing results were read and interpreted by myself, documented by clinical staff."," "}         Malachi Bonds, MD [facility]

## 2023-02-26 NOTE — Patient Instructions (Addendum)
1. Chronic rhinitis - Because of your insurance, we are going to test you at the next visit. - We are going to get some lab work to see if you have evidence of allergies via the blood. - We can figure out a plan once we have the results from the testing.  - Add on carbinoxamine 4 mg twice daily to help with drying out the nasal cavity.   2. COPD  - Continue to follow with [facility]. - Hopefully we will find someone to take over for [name].   3. Recurrent sinusitis - We will obtain some screening labs to evaluate your immune system.  - Labs to evaluate the quantitative [facility]) aspects of your immune system: IgG/IgA/IgM, CBC with differential - Labs to evaluate the qualitative (HOW WELL THEY WORK) aspects of your immune system: CH50, Pneumococcal titers, Tetanus titers, Diphtheria titers - We may consider immunizations with Pneumovax and Tdap to challenge your immune system, and then obtain repeat titers in 4-6 weeks.   4. Return in about 4 weeks (around 03/26/2023) for repeat testing.    Please inform us of any Emergency Department visits, hospitalizations, or changes in symptoms. Call us before going to the ED for breathing or allergy symptoms since we might be able to fit you in for a sick visit. Feel free to contact us anytime with any questions, problems, or concerns.  It was a pleasure to meet you today!  Websites that have reliable patient information: 1. American Academy of Asthma, Allergy, and Immunology: www.aaaai.org 2. Food Allergy Research and Education (FARE): foodallergy.org 3. Mothers of Asthmatics: http://www.asthmacommunitynetwork.org 4. American College of Allergy, Asthma, and Immunology: www.acaai.org   COVID-19 Vaccine Information can be found at: PodExchange.nl For questions related to vaccine distribution or appointments, please email [email or web address] or call [phone].      "Like" [place] on Facebook and Instagram for our latest updates!      A healthy democracy works best when [employer] participate! Make sure you are registered to vote! If you have moved or changed any of your contact information, you will need to get this updated before voting! Scan the QR codes below to learn more!

## 2023-03-02 LAB — ALLERGEN PROFILE, MOLD
Aureobasidi Pullulans IgE: <0.1 kU/L
Candida Albicans IgE: <0.1 kU/L
M009-IgE Fusarium proliferatum: <0.1 kU/L
M014-IgE Epicoccum purpur: <0.1 kU/L
Mucor Racemosus IgE: <0.1 kU/L
Phoma Betae IgE: <0.1 kU/L
Setomelanomma Rostrat: <0.1 kU/L
Stemphylium Herbarum IgE: <0.1 kU/L

## 2023-03-02 LAB — STREP PNEUMONIAE 23 SEROTYPES IGG
Pneumo Ab Type 1*: 0.1 ug/mL — ABNORMAL LOW (ref >1.3)
Pneumo Ab Type 12 (12F)*: 0.6 ug/mL — ABNORMAL LOW (ref >1.3)
Pneumo Ab Type 14*: <0.1 ug/mL — ABNORMAL LOW (ref >1.3)
Pneumo Ab Type 17 (17F)*: <0.1 ug/mL — ABNORMAL LOW (ref >1.3)
Pneumo Ab Type 19 (19F)*: 0.7 ug/mL — ABNORMAL LOW (ref >1.3)
Pneumo Ab Type 2*: 0.6 ug/mL — ABNORMAL LOW (ref >1.3)
Pneumo Ab Type 20*: 0.7 ug/mL — ABNORMAL LOW (ref >1.3)
Pneumo Ab Type 22 (22F)*: <0.1 ug/mL — ABNORMAL LOW (ref >1.3)
Pneumo Ab Type 23 (23F)*: <0.1 ug/mL — ABNORMAL LOW (ref >1.3)
Pneumo Ab Type 26 (6B)*: <0.1 ug/mL — ABNORMAL LOW (ref >1.3)
Pneumo Ab Type 3*: 0.1 ug/mL — ABNORMAL LOW (ref >1.3)
Pneumo Ab Type 34 (10A)*: <0.1 ug/mL — ABNORMAL LOW (ref >1.3)
Pneumo Ab Type 4*: 0.1 ug/mL — ABNORMAL LOW (ref >1.3)
Pneumo Ab Type 43 (11A)*: <0.1 ug/mL — ABNORMAL LOW (ref >1.3)
Pneumo Ab Type 5*: <0.1 ug/mL — ABNORMAL LOW (ref >1.3)
Pneumo Ab Type 51 (7F)*: 0.1 ug/mL — ABNORMAL LOW (ref >1.3)
Pneumo Ab Type 54 (15B)*: <0.2 ug/mL — ABNORMAL LOW (ref >1.3)
Pneumo Ab Type 56 (18C)*: 0.5 ug/mL — ABNORMAL LOW (ref >1.3)
Pneumo Ab Type 57 (19A)*: 0.7 ug/mL — ABNORMAL LOW (ref >1.3)
Pneumo Ab Type 68 (9V)*: 0.4 ug/mL — ABNORMAL LOW (ref >1.3)
Pneumo Ab Type 70 (33F)*: 1.5 ug/mL (ref >1.3)
Pneumo Ab Type 8*: <0.3 ug/mL — ABNORMAL LOW (ref >1.3)
Pneumo Ab Type 9 (9N)*: <0.1 ug/mL — ABNORMAL LOW (ref >1.3)

## 2023-03-02 LAB — IGG, IGA, IGM
IgA/Immunoglobulin A, Serum: 155 mg/dL (ref 87–352)
IgG (Immunoglobin G), Serum: 695 mg/dL (ref 586–1602)
IgM (Immunoglobulin M), Srm: 90 mg/dL (ref 26–217)

## 2023-03-02 LAB — ALLERGENS W/COMP RFLX AREA 2
Alternaria Alternata IgE: <0.1 kU/L
Aspergillus Fumigatus IgE: 0.65 kU/L — AB
Bermuda Grass IgE: <0.1 kU/L
Cedar, Mountain IgE: <0.1 kU/L
Cladosporium Herbarum IgE: <0.1 kU/L
Cockroach, German IgE: <0.1 kU/L
Common Silver Birch IgE: <0.1 kU/L
Cottonwood IgE: <0.1 kU/L
D Farinae IgE: 0.31 kU/L — AB
D Pteronyssinus IgE: 0.15 kU/L — AB
E001-IgE Cat Dander: <0.1 kU/L
E005-IgE Dog Dander: <0.1 kU/L
Elm, American IgE: <0.1 kU/L
IgE (Immunoglobulin E), Serum: 40 [IU]/mL (ref 6–495)
Johnson Grass IgE: <0.1 kU/L
Maple/Box Elder IgE: <0.1 kU/L
Mouse Urine IgE: <0.1 kU/L
Oak, White IgE: <0.1 kU/L
Pecan, Hickory IgE: <0.1 kU/L
Penicillium Chrysogen IgE: <0.1 kU/L
Pigweed, Rough IgE: <0.1 kU/L
Ragweed, Short IgE: <0.1 kU/L
Sheep Sorrel IgE Qn: <0.1 kU/L
Timothy Grass IgE: <0.1 kU/L
White Mulberry IgE: <0.1 kU/L

## 2023-03-02 LAB — COMPLEMENT, TOTAL: Compl, Total (CH50): >60 U/mL (ref >41)

## 2023-03-02 LAB — DIPHTHERIA / TETANUS ANTIBODY PANEL
Diphtheria Ab: 0.27 [IU]/mL (ref <0.10)
Tetanus Ab, IgG: 4.68 [IU]/mL (ref <0.10)

## 2023-03-05 ENCOUNTER — Ambulatory Visit: Payer: Medicare Other | Admitting: Allergy & Immunology

## 2023-03-06 ENCOUNTER — Ambulatory Visit: Payer: Medicare Other | Admitting: Cardiology

## 2023-03-06 ENCOUNTER — Encounter: Payer: Self-pay | Admitting: Cardiology

## 2023-03-06 ENCOUNTER — Ambulatory Visit: Payer: Medicare Other | Attending: Cardiology | Admitting: Cardiology

## 2023-03-06 VITALS — BP 116/68 | HR 98 | Ht 62.0 in | Wt 131.2 lb

## 2023-03-06 DIAGNOSIS — J449 Chronic obstructive pulmonary disease, unspecified: Secondary | ICD-10-CM | POA: Diagnosis not present

## 2023-03-06 DIAGNOSIS — I471 Supraventricular tachycardia, unspecified: Secondary | ICD-10-CM

## 2023-03-06 NOTE — Patient Instructions (Signed)
Medication Instructions:  Your physician recommends that you continue on your current medications as directed. Please refer to the Current Medication list given to you today.   Labwork: None today  Testing/Procedures: None today  Follow-Up: 6 months  Any Other Special Instructions Will Be Listed Below (If Applicable).  If you need a refill on your cardiac medications before your next appointment, please call your pharmacy.  

## 2023-03-06 NOTE — Progress Notes (Signed)
Cardiology Office Note  Date: 03/06/2023   ID: [name], DOB [birth year], MRN [id number]  History of Present Illness: [name] is a 67 y.o. female last seen in April.  She is here for a follow-up visit.  Record review finds recent hospitalization with acute on chronic hypoxic respiratory failure associated with URI/COPD exacerbation, also UTI.  She states that she is not back to baseline, still short of breath despite continued use of oxygen, she uses BiPAP as well for OSA, has been using this during the daytime so.  [name] has moved on to another health system, she has follow-up scheduled with Pulmonary and can establish with a new provider going forward.  Recent echocardiogram showed vigorous LVEF at 70 to 75% as noted below.  I reviewed her medications.  Regimen includes Lipitor, Cardizem CD 240 mg daily.  Physical Exam: VS:  BP 116/68 (BP Location: Left Arm, Patient Position: Sitting, Cuff Size: Normal)   Pulse 98   Ht 5\' 2"  (1.575 m)   Wt 131 lb 3.2 oz (59.5 kg)   SpO2 98%   BMI 24.00 kg/m , BMI Body mass index is 24 kg/m.  Wt Readings from Last 3 Encounters:  03/06/23 131 lb 3.2 oz (59.5 kg)  02/26/23 134 lb 3.2 oz (60.9 kg)  02/20/23 130 lb (59 kg)    General: Patient appears comfortable at rest.  Wearing oxygen via nasal cannula. HEENT: Conjunctiva and lids normal. Lungs: Decreased breath sounds, no wheezing. Cardiac: Regular rate and rhythm, no S3 or significant systolic murmur, no pericardial rub.  ECG:  An ECG dated 02/20/2023 was personally reviewed today and demonstrated:  Sinus rhythm with lead motion artifact, R' in lead V1.  Labwork: 03/19/2022: TSH 1.740 07/02/2022: ALT 19; AST 19 02/22/2023: B Natriuretic Peptide 31.0; Magnesium 1.8 02/24/2023: BUN 17; Creatinine, Ser 0.61; Hemoglobin 11.9; Platelets 319; Potassium 4.1; Sodium 136   Other Studies Reviewed Today:  Echocardiogram 02/23/2023:  1. Left ventricular ejection fraction, by  estimation, is 70 to 75%. The  left ventricle has hyperdynamic function. The left ventricle has no  regional wall motion abnormalities. Left ventricular diastolic parameters  were normal.   2. Right ventricular systolic function is hyperdynamic. The right  ventricular size is normal.   3. The mitral valve is normal in structure. No evidence of mitral valve  regurgitation. No evidence of mitral stenosis.   4. The aortic valve was not well visualized. Aortic valve regurgitation  is not visualized.   5. The inferior vena cava is normal in size with greater than 50%  respiratory variability, suggesting right atrial pressure of 3 mmHg.   Assessment and Plan:  1.  PSVT.  Continue Cardizem CD 240 mg daily.  Heart rate elevated at baseline in the setting of severe pulmonary disease.  Echocardiogram done during recent hospital stay shows vigorous LVEF at 70 to 75%, no significant valvular abnormalities.   2.  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  Also OSA on BiPAP.  Continue to follow with Pulmonary.  She needs to establish with new provider following the departure of [name].   3.  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  Disposition:  Follow up  6 months.  Signed, Jonelle Sidle, M.D., F.A.C.C. [facility]

## 2023-03-11 ENCOUNTER — Ambulatory Visit: Payer: Medicare Other | Admitting: Cardiology

## 2023-03-26 ENCOUNTER — Ambulatory Visit (INDEPENDENT_AMBULATORY_CARE_PROVIDER_SITE_OTHER): Payer: Medicare Other | Admitting: Allergy & Immunology

## 2023-03-26 ENCOUNTER — Encounter: Payer: Self-pay | Admitting: Allergy & Immunology

## 2023-03-26 VITALS — BP 100/60 | HR 101 | Temp 98.2°F | Resp 16

## 2023-03-26 DIAGNOSIS — J449 Chronic obstructive pulmonary disease, unspecified: Secondary | ICD-10-CM

## 2023-03-26 DIAGNOSIS — J3089 Other allergic rhinitis: Secondary | ICD-10-CM | POA: Diagnosis not present

## 2023-03-26 DIAGNOSIS — J329 Chronic sinusitis, unspecified: Secondary | ICD-10-CM | POA: Diagnosis not present

## 2023-03-26 NOTE — Progress Notes (Signed)
FOLLOW UP  Date of Service/Encounter:  03/26/23   Assessment:   Perennial allergic rhinitis (indoor molds, dust mites) - will do intradermal testing at the next visit   COPD - followed by [facility] (now seeing Rubye Oaks, NP)    Recurrent sinusitis - getting immune workup  Plan/Recommendations:   1. Chronic rhinitis - Information on dust mites provided.  - We will do intradermal testing in the future at the next visit to be sure that there is not something else going on that is triggering your rhinitis symptoms.  - We may consider allergy shots to help with this long term.  - Continue on carbinoxamine 4 mg twice daily to help with drying out the nasal cavity.   2. COPD  - Continue to follow with [facility]. - I will send my note to Tammy Parrett to keep her in the loop.   3. Recurrent sinusitis - I would definitely get the Prenvar-20 at some point, which you can get at Dr. Alonza Smoker office.  - Then would get repeat testing to the Streptococcal titers in 4-6 weeks after this vaccine.  - I will also get also get some labs to look for the different markers on your immune cells. - This is important since you tend to get these viral infections.   4. Return in about 4 weeks (around 04/23/2023) for INTRADERMAL TESTING.    Subjective:   [name] is a 67 y.o. female presenting today for follow up of  Chief Complaint  Patient presents with   Follow-up    ANYIAH OLLIS has a history of the following: Patient Active Problem List   Diagnosis Date Noted   COPD with acute exacerbation (HCC) 02/20/2023   Acute on chronic respiratory failure with hypoxia (HCC) 02/20/2023   UTI (urinary tract infection) 02/20/2023   OSA (obstructive sleep apnea) 02/20/2023   Fracture of femoral head (HCC) 07/15/2022   C. difficile colitis 04/08/2022   Abdominal pain 03/24/2022   Rectal bleeding 03/24/2022   External hemorrhoids with complication 02/11/2022   Stricture  of sigmoid colon (HCC) 02/07/2022   Supplemental oxygen dependent 10/23/2021   Diverticulitis of large intestine without perforation or abscess without bleeding 10/23/2021   Gastroesophageal reflux disease 08/07/2021   Solitary pulmonary nodule 09/08/2019   Muscle weakness (generalized)    Chronic respiratory failure with hypoxia and hypercapnia (HCC) 01/21/2015   Macrocytic anemia 01/21/2015   Respiratory failure with hypercapnia (HCC) 11/14/2014   COPD exacerbation (HCC) 11/14/2014   Anemia 11/14/2014   Elevated glucose 05/24/2012   COPD Group D symptoms 12/23/2011   Hyperlipidemia 12/23/2011    History obtained from: chart review and patient.  Discussed the use of AI scribe software for clinical note transcription with the patient and/or guardian, who gave verbal consent to proceed.  [name] is a 67 y.o. female presenting for a follow up visit.  She was last seen in October 2024.  At that time, we got labs to look at her immune system as well as environmental allergies.  For her COPD, she continue to follow with [name].   Labs showed sensitization to dust mites and Aspergillus.  We talked about doing intradermal's at this next visit.  We did do an immune workup that showed she was only protective to 1 out of 23 serotypes of Streptococcus pneumonia.  We recommended that she get a Pneumovax or Prevnar with repeat titers.  Since last visit, she has been better. She thinks that the carbinoxamine  did help a lot.   The patient, with a history of chronic obstructive pulmonary disease (COPD), granulomatous disease, and environmental allergies, presents with improved respiratory symptoms. She remains on the Trelegy one puff once daily as well as Dalisrep for approximately 8-10 years. She is not sure whether it is helping, but she has certainly not gotten worse since being on it. She has an appointment next week with Tammy, an APNP with Pulmonology. She was previously  followed by [name].    They report a significant improvement in their breathing, which they attribute to the use of carbinoxamine. They also report a reduction in eye symptoms since starting the medication. They have partial dust mite covers on their bedding and no carpeting in their home. They also have pets.  She is wondering whether the Aspergillus lab result is related to her granulomas in her lungs. Reviewed the imaging and it refers to "old granulomas". Per the patient, they have been stable in size for years.    Chest CT (June 2024):  IMPRESSION: 1. No evidence of pulmonary embolism. 2. No acute findings in the thorax to account for the patient's symptoms. 3. Aortic atherosclerosis, in addition to left anterior descending coronary artery disease. Please note that although the presence of coronary artery calcium documents the presence of coronary artery disease, the severity of this disease and any potential stenosis cannot be assessed on this non-gated CT examination. Assessment for potential risk factor modification, dietary therapy or pharmacologic therapy may be warranted, if clinically indicated. 4. Diffuse bronchial wall thickening with severe centrilobular and paraseptal emphysema; imaging findings suggestive of underlying COPD. 5. Old granulomatous disease. 6. Cholelithiasis.  The patient has been experiencing recurrent herpes lesions, which are preceded by a flu-like illness. They are currently on daily acyclovir for this condition. She has never had these lesions swabbed to make sure that they are HSV, but she is open to doing that. She has never been to see Infectious Disease. They also report a history of granulomatous disease, with calcified nodules visible on chest x-rays. She is planning to get the Prevnar-20, but she is going to wait until next week to get it until she feels a bit better.   Otherwise, there have been no changes to her past medical history, surgical history, family history, or  social history.    Review of systems otherwise negative other than that mentioned in the HPI.    Objective:   Blood pressure 100/60, pulse (!) 101, temperature 98.2 F (36.8 C), resp. rate 16, SpO2 96%. There is no height or weight on file to calculate BMI.    Physical Exam Vitals reviewed.  Constitutional:      Appearance: Normal appearance. She is well-developed.     Comments: Talking in complete sentences.   HENT:     Head: Normocephalic and atraumatic.     Right Ear: Tympanic membrane, ear canal and external ear normal. No drainage, swelling or tenderness. Tympanic membrane is not injected, scarred, erythematous, retracted or bulging.     Left Ear: Tympanic membrane, ear canal and external ear normal. No drainage, swelling or tenderness. Tympanic membrane is not injected, scarred, erythematous, retracted or bulging.     Nose: No nasal deformity, septal deviation, mucosal edema or rhinorrhea.     Right Turbinates: Enlarged, swollen and pale.     Left Turbinates: Enlarged, swollen and pale.     Right Sinus: No maxillary sinus tenderness or frontal sinus tenderness.     Left Sinus: No  maxillary sinus tenderness or frontal sinus tenderness.     Mouth/Throat:     Mouth: Mucous membranes are not pale and not dry.     Pharynx: Uvula midline.  Eyes:     General:        Right eye: No discharge.        Left eye: No discharge.     Conjunctiva/sclera: Conjunctivae normal.     Right eye: Right conjunctiva is not injected. No chemosis.    Left eye: Left conjunctiva is not injected. No chemosis.    Pupils: Pupils are equal, round, and reactive to light.  Cardiovascular:     Rate and Rhythm: Normal rate and regular rhythm.     Heart sounds: Normal heart sounds.  Pulmonary:     Effort: Accessory muscle usage present. No tachypnea or respiratory distress.     Breath sounds: Examination of the right-upper field reveals wheezing and rhonchi. Examination of the left-upper field reveals  wheezing and rhonchi. Examination of the right-middle field reveals wheezing and rhonchi. Examination of the left-middle field reveals wheezing and rhonchi. Wheezing and rhonchi present. No rales.     Comments: Oxygen in place.  Chest:     Chest wall: No tenderness.  Lymphadenopathy:     Head:     Right side of head: No submandibular, tonsillar or occipital adenopathy.     Left side of head: No submandibular, tonsillar or occipital adenopathy.     Cervical: No cervical adenopathy.  Skin:    Coloration: Skin is not pale.     Findings: No abrasion, erythema, petechiae or rash. Rash is not papular, urticarial or vesicular.  Neurological:     Mental Status: She is alert.  Psychiatric:        Behavior: Behavior is cooperative.      Diagnostic studies: none      Malachi Bonds, MD  [facility]

## 2023-03-26 NOTE — Patient Instructions (Addendum)
1. Chronic rhinitis - Information on dust mites provided.  - We will do intradermal testing in the future at the next visit to be sure that there is not something else going on that is triggering your rhinitis symptoms.  - We may consider allergy shots to help with this long term.  - Continue on carbinoxamine 4 mg twice daily to help with drying out the nasal cavity.   2. COPD  - Continue to follow with [facility]. - I will send my note to Tammy Parrett to keep her in the loop.   3. Recurrent sinusitis - I would definitely get the Prenvar-20 at some point, which you can get at Dr. Alonza Smoker office.  - Then would get repeat testing to the Streptococcal titers in 4-6 weeks after this vaccine.  - I will also get also get some labs to look for the different markers on your immune cells. - This is important since you tend to get these viral infections.   4. Return in about 4 weeks (around 04/23/2023) for INTRADERMAL TESTING.      Please inform us of any Emergency Department visits, hospitalizations, or changes in symptoms. Call us before going to the ED for breathing or allergy symptoms since we might be able to fit you in for a sick visit. Feel free to contact us anytime with any questions, problems, or concerns.  It was a pleasure to see you and your family again today!  Websites that have reliable patient information: 1. American Academy of Asthma, Allergy, and Immunology: www.aaaai.org 2. Food Allergy Research and Education (FARE): foodallergy.org 3. Mothers of Asthmatics: http://www.asthmacommunitynetwork.org 4. American College of Allergy, Asthma, and Immunology: www.acaai.org      "Like" [place] on Facebook and Instagram for our latest updates!      A healthy democracy works best when [employer] participate! Make sure you are registered to vote! If you have moved or changed any of your contact information, you will need to get this updated before voting! Scan the QR codes  below to learn more!        Control of Dust Mite Allergen    Dust mites play a major role in allergic asthma and rhinitis.  They occur in environments with high humidity wherever human skin is found.  Dust mites absorb humidity from the atmosphere (ie, they do not drink) and feed on organic matter (including shed human and animal skin).  Dust mites are a microscopic type of insect that you cannot see with the naked eye.  High levels of dust mites have been detected from mattresses, pillows, carpets, upholstered furniture, bed covers, clothes, soft toys and any woven material.  The principal allergen of the dust mite is found in its feces.  A gram of dust may contain 1,000 mites and 250,000 fecal particles.  Mite antigen is easily measured in the air during house cleaning activities.  Dust mites do not bite and do not cause harm to humans, other than by triggering allergies/asthma.    Ways to decrease your exposure to dust mites in your home:  Encase mattresses, box springs and pillows with a mite-impermeable barrier or cover   Wash sheets, blankets and drapes weekly in hot water (130 F) with detergent and dry them in a dryer on the hot setting.  Have the room cleaned frequently with a vacuum cleaner and a damp dust-mop.  For carpeting or rugs, vacuuming with a vacuum cleaner equipped with a high-efficiency particulate air (HEPA) filter.  The  dust mite allergic individual should not be in a room which is being cleaned and should wait 1 hour after cleaning before going into the room. Do not sleep on upholstered furniture (eg, couches).   If possible removing carpeting, upholstered furniture and drapery from the home is ideal.  Horizontal blinds should be eliminated in the rooms where the person spends the most time (bedroom, study, television room).  Washable vinyl, roller-type shades are optimal. Remove all non-washable stuffed toys from the bedroom.  Wash stuffed toys weekly like sheets and  blankets above.   Reduce indoor humidity to less than 50%.  Inexpensive humidity monitors can be purchased at most hardware stores.  Do not use a humidifier as can make the problem worse and are not recommended.

## 2023-04-01 ENCOUNTER — Encounter: Payer: Self-pay | Admitting: Adult Health

## 2023-04-01 ENCOUNTER — Ambulatory Visit: Payer: Medicare Other | Admitting: Adult Health

## 2023-04-01 VITALS — BP 144/72 | HR 100 | Ht 62.0 in | Wt 133.0 lb

## 2023-04-01 DIAGNOSIS — J449 Chronic obstructive pulmonary disease, unspecified: Secondary | ICD-10-CM

## 2023-04-01 DIAGNOSIS — J432 Centrilobular emphysema: Secondary | ICD-10-CM | POA: Diagnosis not present

## 2023-04-01 DIAGNOSIS — J9611 Chronic respiratory failure with hypoxia: Secondary | ICD-10-CM

## 2023-04-01 DIAGNOSIS — R0609 Other forms of dyspnea: Secondary | ICD-10-CM

## 2023-04-01 NOTE — Progress Notes (Signed)
[place] paperwork signed and faxed to [phone].

## 2023-04-01 NOTE — Progress Notes (Signed)
@Patient  ID: [name], female    DOB: 1956-04-09, 67 y.o.   MRN: [id number]  Chief Complaint  Patient presents with   Hospitalization Follow-up   Discussed the use of AI scribe software for clinical note transcription with the patient, who gave verbal consent to proceed.  Referring provider: Carylon Perches, MD  HPI: 67 year old female former smoker followed for COPD with emphysema and chronic respiratory failure, OSA  REM sleep disorder  TEST/EVENTS :  Spirometry 12/31/11 >> FEV1 0.4 (18%), FEV1% 42   CT chest 10/16/19 >> mod/severe centrilobular emphysema, granuloma RUL   Bipap titration study 05/16/14 >> Bipap 15/10 cm H2O with 3 liters O2 Bipap 02/18/22 to 03/19/22 >> used on 30 of 30 nights with average 14 hrs.  Average AHI 0.7 with Bipap 14/6 cm H2O. Echo 10/31/20 >> EF 55 to 60%, grade 1 DD   04/01/2023 Follow up : COPD O2RF . OSA , post hospital follow-up Patient returns for a 49-month follow-up.  Patient has underlying very severe COPD with emphysema, chronic respiratory failure on home oxygen, sleep apnea on nocturnal BiPAP.  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 She does have chronic allergies and is on Zyrtec, Flonase and Singulair.  She was recently admitted last month for COPD exacerbation with acute on chronic hypoxic respiratory failure.  She was treated with nebulized bronchodilators steroids and empiric antibiotics.  Discharged home on a prednisone taper.  She did have a urinary tract infection.  Was treated with appropriate antibiotics.  Since discharge she is feeling better. Back to baseline with dyspnea at rest . Sedentary lifestyle. Gets air hunger intermittently.    The patient's COPD is managed with Trelegy, Daliresp, and albuterol, both as an inhaler and nebulizer. However, the patient expresses doubt about the effectiveness of albuterol, describing side effects such as nervousness. Despite the use of these medications,  the patient's lung function is significantly reduced, with the patient describing it as "sucky." The patient experiences shortness of breath at rest and with any activity, requiring higher levels of oxygen with activity. The patient also reports episodes of air hunger, which are managed with BiPAP and occasionally with Xanax for relaxation.  BiPAP download shows excellent compliance with daily average usage at 12 hours.  AHI 1.4.  The patient lives alone and manages daily activities despite the severe COPD.  She does have a caregiver that helps her with transportation.    he patient has received a flu shot this year but needs a pneumonia vaccine booster.    She remains on oxygen 4 L at rest and 7 L with activity.  She also uses oxygen with her BiPAP at night at 4 L         Allergies  Allergen Reactions   Ketek [Telithromycin] Other (See Comments)    Loopy, eyes went different directions   Penicillins Other (See Comments)    Unknown, childhood allergy   Adhesive [Tape] Itching and Rash    Break out   Chantix [Varenicline] Palpitations    Immunization History  Administered Date(s) Administered   Fluad Quad(high Dose 65+) 03/21/2022   Fluad Trivalent(High Dose 65+) 02/22/2023   Influenza,inj,Quad PF,6+ Mos 02/14/2020   Influenza-Unspecified 02/03/2013, 02/03/2018, 04/06/2019   Moderna Sars-Covid-2 Vaccination 07/15/2019, 08/14/2019    Past Medical History:  Diagnosis Date   Allergy    Anemia    Anxiety    Arthritis    COPD (chronic obstructive pulmonary disease) (HCC)  [facility] oxygen at [facility]   COPD exacerbation (HCC)    Depression    Diverticulosis    Dyspnea    Dysrhythmia    GERD (gastroesophageal reflux disease)    History of cardiac catheterization    No significant CAD 2005   Hyperlipidemia    Hypoglycemia    IBS (irritable bowel syndrome)    Neuromuscular disorder (HCC)    Osteoporosis    Paroxysmal SVT (supraventricular tachycardia) (HCC)    Pneumonia 2015    Pre-diabetes    Shingles 2012   Sleep apnea     Tobacco History: Social History   Tobacco Use  Smoking Status Former   Current packs/day: 0.00   Average packs/day: 1 pack/day for 40.0 years (40.0 ttl pk-yrs)   Types: Cigarettes   Start date: 05/07/1975   Quit date: 05/07/2015   Years since quitting: 7.9   Passive exposure: Past  Smokeless Tobacco Never   Counseling given: Not Answered   Outpatient Medications Prior to Visit  Medication Sig Dispense Refill   albuterol (VENTOLIN HFA) 108 (90 Base) MCG/ACT inhaler Inhale 1-2 puffs into the lungs every 6 (six) hours as needed for wheezing or shortness of breath.     atorvastatin (LIPITOR) 40 MG tablet TAKE 1 TABLET DAILY 90 tablet 3   azelastine (ASTELIN) 0.1 % nasal spray Place 1 spray into both nostrils daily as needed for rhinitis or allergies. Use in each nostril as directed     carboxymethylcellulose 1 % ophthalmic solution Place 2 drops into both eyes 3 (three) times daily as needed (for dry eyes).     cetirizine (ZYRTEC) 10 MG tablet Take 10 mg by mouth daily.     diltiazem (DILACOR XR) 240 MG 24 hr capsule Take 1 capsule (240 mg total) by mouth daily. 90 capsule 1   escitalopram (LEXAPRO) 20 MG tablet Take 20 mg by mouth daily.     Fluticasone-Umeclidin-Vilant (TRELEGY ELLIPTA) 100-62.5-25 MCG/ACT AEPB Inhale 1 puff into the lungs daily.     ibuprofen (ADVIL) 200 MG tablet Take 400 mg by mouth every 6 (six) hours as needed for moderate pain.     montelukast (SINGULAIR) 10 MG tablet Take 1 tablet (10 mg total) by mouth at bedtime. 30 tablet 1   NON FORMULARY Pt uses a bipap with 4L of oxygen when laying down     oxybutynin (DITROPAN) 5 MG tablet Take 5 mg by mouth daily.      OXYGEN Inhale 3.5-7 L into the lungs continuous.     roflumilast (DALIRESP) 500 MCG TABS tablet Take 500 mcg by mouth daily.     valACYclovir (VALTREX) 500 MG tablet Take 500 mg by mouth daily.     Carbinoxamine Maleate 4 MG TABS Take 1 tablet (4 mg total)  by mouth in the morning and at bedtime. 56 tablet 5   cephALEXin (KEFLEX) 500 MG capsule Take 1 capsule (500 mg total) by mouth every 8 (eight) hours. 9 capsule 0   doxycycline (VIBRA-TABS) 100 MG tablet Take 1 tablet (100 mg total) by mouth every 12 (twelve) hours. 4 tablet 0   predniSONE (DELTASONE) 10 MG tablet Take 6 tablets (60 mg total) by mouth daily with breakfast. And decrease by one tablet daily 21 tablet 0   No facility-administered medications prior to visit.     Review of Systems:   Constitutional:   No  weight loss, night sweats,  Fevers, chills, +fatigue, or  lassitude.  HEENT:   No headaches,  Difficulty swallowing,  Tooth/dental problems, or  Sore throat,                No sneezing, itching, ear ache, nasal congestion, post nasal drip,   CV:  No chest pain,  Orthopnea, PND, swelling in lower extremities, anasarca, dizziness, palpitations, syncope.   GI  No heartburn, indigestion, abdominal pain, nausea, vomiting, diarrhea, change in bowel habits, loss of appetite, bloody stools.   Resp:  No chest wall deformity  Skin: no rash or lesions.  GU: no dysuria, change in color of urine, no urgency or frequency.  No flank pain, no hematuria   MS:  No joint pain or swelling.  No decreased range of motion.  No back pain.    Physical Exam  BP (!) 144/72   [name] 100   Ht 5\' 2"  (1.575 m)   Wt 133 lb (60.3 kg)   SpO2 94% Comment: 4L of oxygen  BMI 24.33 kg/m   GEN: A/Ox3; pleasant , NAD, frail and elderly  on O2    HEENT:  [place]/AT,   NOSE-clear, THROAT-clear, no lesions, no postnasal drip or exudate noted.   NECK:  Supple w/ fair ROM; no JVD; normal carotid impulses w/o bruits; no thyromegaly or nodules palpated; no lymphadenopathy.    RESP decreased breath sounds at the bases no accessory muscle use, no dullness to percussion  CARD:  RRR, no m/r/g, no peripheral edema, pulses intact, no cyanosis or clubbing.  GI:   Soft & nt; nml bowel sounds; no organomegaly or  masses detected.   Musco: Warm bil, no deformities or joint swelling noted.   Neuro: alert, no focal deficits noted.    Skin: Warm, no lesions or rashes    Lab Results:  CBC       ProBNP No results found for: "PROBNP"  Imaging: No results found.  Administration History     None          Latest Ref Rng & Units 11/25/2022   11:43 AM  PFT Results  FVC-Predicted Pre % 33   FVC-Post L 1.07   FVC-Predicted Post % 35   Pre FEV1/FVC % % 46   Post FEV1/FCV % % 47   FEV1-Pre L 0.47   FEV1-Predicted Pre % 20   FEV1-Post L 0.50   DLCO uncorrected ml/min/mmHg 4.39   DLCO UNC% % 22   DLCO corrected ml/min/mmHg 4.59   DLCO COR %Predicted % 23   DLVA Predicted % 43   TLC L 4.97   TLC % Predicted % 101   RV % Predicted % 188     No results found for: "NITRICOXIDE"      Assessment & Plan:   Assessment and Plan    Chronic Obstructive Pulmonary Disease (COPD) Very  severe COPD -FEV1 20% Baseline -significant symptom burden with  dyspnea at rest and with minimal activity. They are currently on Trelegy, Daliresp, and albuterol, though albuterol causes jitteriness and is perceived as ineffective. We will discontinue Daliresp and start Ohtuvayre. . Albuterol inhaler will be used as needed, with consideration for half doses to manage side effects. We emphasized the importance of maximizing lung function and minimizing hospitalizations, discussing the potential use of biologics like Dupixent if frequent prednisone use becomes necessary. Recommend Prevnar 20 vaccine .  Discussed referral to [facility] for lung transplant evaluation, with a follow-up in three months.  Emphysema referral to [facility] for lung transplant evaluation.  Sleep Apnea Their obstructive sleep apnea is managed with BiPAP and nocturnal oxygen at  4 liters, emphasizing the importance of BiPAP   Chronic respiratory failure.  Continue on oxygen to maintain O2 saturation greater than 88 to 90%  General Health  Maintenance They have received their flu shot for the year and will be rec  Prevnar 20 vaccine.  Follow-up We will follow up in three months and consider virtual follow-up every other visit to reduce their travel burden.         I spent  45  minutes dedicated to the care of this patient on the date of this encounter to include pre-visit review of records, face-to-face time with the patient discussing conditions above, post visit ordering of testing, clinical documentation with the electronic health record, making appropriate referrals as documented, and communicating necessary findings to members of the patients care team.    Rubye Oaks, NP 04/01/2023

## 2023-04-01 NOTE — Patient Instructions (Addendum)
Continue on Trelegy 1 puff daily, rinse after use  Change Daliresp to [facility] [employer] Twice daily.  Albuterol inhaler or neb As needed   Prevnar 20 vaccine as discussed at pharmacy   Continue on Oxygen 4l/m at rest and 7l/m with activity   Continue on BIPAP At bedtime  with Oxygen 4l/m  Keep up good work.   Refer to [facility]Transplant team.  Follow up in 3 months and As needed

## 2023-04-09 ENCOUNTER — Other Ambulatory Visit: Payer: Self-pay

## 2023-04-09 MED ORDER — DILTIAZEM HCL ER 240 MG PO CP24: 240.0000 mg | ORAL_CAPSULE | Freq: Every day | ORAL | 1 refills | Status: DC

## 2023-04-21 ENCOUNTER — Telehealth: Payer: Self-pay | Admitting: *Deleted

## 2023-04-21 NOTE — Telephone Encounter (Signed)
Called and spoke with patient, she states she signed the consent form for the [facility] while she was in the [place] office.  Advised that I would try to track down the form.  She stated that if needed we could send the form to her and she would sign it.  Advised we would let her know if we could not locate the form.

## 2023-04-21 NOTE — Telephone Encounter (Signed)
Unable to locate form that patient signed.  Placed a form in the mail for patient to complete and mail back.

## 2023-05-13 NOTE — Telephone Encounter (Signed)
 Patient Consent form received and faxed to [place].

## 2023-05-15 ENCOUNTER — Telehealth: Payer: Self-pay | Admitting: Pharmacist

## 2023-05-15 NOTE — Telephone Encounter (Signed)
 Provider form received for [facility]. Faxed to [employer] with insurance card copy and clinicals  Phone#: [phone] Fax#: [phone]  Chesley Mires, PharmD, MPH, BCPS, CPP Clinical Pharmacist (Rheumatology and Pulmonology)

## 2023-05-19 DIAGNOSIS — Z23 Encounter for immunization: Secondary | ICD-10-CM | POA: Diagnosis not present

## 2023-05-19 DIAGNOSIS — B070 Plantar wart: Secondary | ICD-10-CM | POA: Diagnosis not present

## 2023-05-19 DIAGNOSIS — L218 Other seborrheic dermatitis: Secondary | ICD-10-CM | POA: Diagnosis not present

## 2023-05-19 DIAGNOSIS — M2550 Pain in unspecified joint: Secondary | ICD-10-CM | POA: Diagnosis not present

## 2023-05-19 DIAGNOSIS — Z1283 Encounter for screening for malignant neoplasm of skin: Secondary | ICD-10-CM | POA: Diagnosis not present

## 2023-05-19 DIAGNOSIS — J9611 Chronic respiratory failure with hypoxia: Secondary | ICD-10-CM | POA: Diagnosis not present

## 2023-05-19 DIAGNOSIS — Z87440 Personal history of urinary (tract) infections: Secondary | ICD-10-CM | POA: Diagnosis not present

## 2023-05-19 DIAGNOSIS — Z79899 Other long term (current) drug therapy: Secondary | ICD-10-CM | POA: Diagnosis not present

## 2023-05-19 DIAGNOSIS — J449 Chronic obstructive pulmonary disease, unspecified: Secondary | ICD-10-CM | POA: Diagnosis not present

## 2023-05-19 DIAGNOSIS — N390 Urinary tract infection, site not specified: Secondary | ICD-10-CM | POA: Diagnosis not present

## 2023-05-21 ENCOUNTER — Other Ambulatory Visit (HOSPITAL_COMMUNITY): Payer: Self-pay | Admitting: Internal Medicine

## 2023-05-21 DIAGNOSIS — N390 Urinary tract infection, site not specified: Secondary | ICD-10-CM

## 2023-05-21 NOTE — Telephone Encounter (Addendum)
Received fax from [facility] stating patient's [name] rx will be [facility] [phone]). Pharmacy will reach out to schedule shipment. IF patient cannot afford medication, the rx will be triaged back to [facility]  Patient ID: [id number]  Chesley Mires, PharmD, MPH, BCPS, CPP Clinical Pharmacist (Rheumatology and Pulmonology)

## 2023-05-29 ENCOUNTER — Ambulatory Visit: Payer: Medicare Other | Admitting: Adult Health

## 2023-06-02 ENCOUNTER — Ambulatory Visit (HOSPITAL_COMMUNITY)
Admission: RE | Admit: 2023-06-02 | Discharge: 2023-06-02 | Disposition: A | Discharge status: Home / Self Care | Payer: Medicare Other | Source: Ambulatory Visit | Attending: Internal Medicine | Admitting: Internal Medicine

## 2023-06-02 DIAGNOSIS — N390 Urinary tract infection, site not specified: Secondary | ICD-10-CM | POA: Insufficient documentation

## 2023-06-16 DIAGNOSIS — M65331 Trigger finger, right middle finger: Secondary | ICD-10-CM | POA: Diagnosis not present

## 2023-07-02 ENCOUNTER — Other Ambulatory Visit: Payer: Self-pay

## 2023-07-02 MED ORDER — ATORVASTATIN CALCIUM 40 MG PO TABS: ORAL_TABLET | ORAL | 3 refills | Status: DC

## 2023-07-21 ENCOUNTER — Ambulatory Visit: Payer: Medicare Other | Admitting: Adult Health

## 2023-09-01 DIAGNOSIS — E559 Vitamin D deficiency, unspecified: Secondary | ICD-10-CM | POA: Diagnosis not present

## 2023-09-01 DIAGNOSIS — E663 Overweight: Secondary | ICD-10-CM | POA: Diagnosis not present

## 2023-09-01 DIAGNOSIS — M810 Age-related osteoporosis without current pathological fracture: Secondary | ICD-10-CM | POA: Diagnosis not present

## 2023-09-01 DIAGNOSIS — Z6827 Body mass index (BMI) 27.0-27.9, adult: Secondary | ICD-10-CM | POA: Diagnosis not present

## 2023-09-01 DIAGNOSIS — M79641 Pain in right hand: Secondary | ICD-10-CM | POA: Diagnosis not present

## 2023-09-01 DIAGNOSIS — M79642 Pain in left hand: Secondary | ICD-10-CM | POA: Diagnosis not present

## 2023-09-01 DIAGNOSIS — M2540 Effusion, unspecified joint: Secondary | ICD-10-CM | POA: Diagnosis not present

## 2023-09-01 DIAGNOSIS — R5383 Other fatigue: Secondary | ICD-10-CM | POA: Diagnosis not present

## 2023-09-01 DIAGNOSIS — M2560 Stiffness of unspecified joint, not elsewhere classified: Secondary | ICD-10-CM | POA: Diagnosis not present

## 2023-09-02 ENCOUNTER — Other Ambulatory Visit (HOSPITAL_COMMUNITY): Payer: Self-pay | Admitting: Rheumatology

## 2023-09-02 DIAGNOSIS — Z780 Asymptomatic menopausal state: Secondary | ICD-10-CM

## 2023-09-04 ENCOUNTER — Other Ambulatory Visit (HOSPITAL_COMMUNITY)

## 2023-09-11 ENCOUNTER — Ambulatory Visit (HOSPITAL_COMMUNITY)
Admission: RE | Admit: 2023-09-11 | Discharge: 2023-09-11 | Disposition: A | Discharge status: Home / Self Care | Source: Ambulatory Visit | Attending: Rheumatology | Admitting: Rheumatology

## 2023-09-11 DIAGNOSIS — Z780 Asymptomatic menopausal state: Secondary | ICD-10-CM | POA: Insufficient documentation

## 2023-09-11 DIAGNOSIS — M810 Age-related osteoporosis without current pathological fracture: Secondary | ICD-10-CM | POA: Diagnosis not present

## 2023-09-16 ENCOUNTER — Telehealth (INDEPENDENT_AMBULATORY_CARE_PROVIDER_SITE_OTHER): Admitting: Adult Health

## 2023-09-16 ENCOUNTER — Ambulatory Visit: Payer: Medicare Other | Admitting: Adult Health

## 2023-09-16 ENCOUNTER — Encounter: Payer: Self-pay | Admitting: Adult Health

## 2023-09-16 DIAGNOSIS — K219 Gastro-esophageal reflux disease without esophagitis: Secondary | ICD-10-CM

## 2023-09-16 DIAGNOSIS — J449 Chronic obstructive pulmonary disease, unspecified: Secondary | ICD-10-CM

## 2023-09-16 DIAGNOSIS — G4733 Obstructive sleep apnea (adult) (pediatric): Secondary | ICD-10-CM

## 2023-09-16 DIAGNOSIS — R490 Dysphonia: Secondary | ICD-10-CM

## 2023-09-16 DIAGNOSIS — Z87891 Personal history of nicotine dependence: Secondary | ICD-10-CM | POA: Diagnosis not present

## 2023-09-16 DIAGNOSIS — J9611 Chronic respiratory failure with hypoxia: Secondary | ICD-10-CM

## 2023-09-16 NOTE — Patient Instructions (Addendum)
 PPI  Zyrte and singular  Saline nsal spray  Gel  O2 4l/m  Rfer to ent -[employer]

## 2023-09-16 NOTE — Progress Notes (Unsigned)
 Virtual Visit via Video Note  I connected with [name] on 09/17/23 at 11:00 AM EDT by a video enabled telemedicine application and verified that I am speaking with the correct person using two identifiers.  Location: Patient: Home  Provider: Office    I discussed the limitations of evaluation and management by telemedicine and the availability of in person appointments. The patient expressed understanding and agreed to proceed.  History of Present Illness: 68 year old female former smoker followed for COPD with emphysema and chronic respiratory failure on home oxygen , sleep apnea-on nocturnal BiPAP, REM sleep disorder  Today's video visit is a 21-month follow-up.  Patient has an extensive medical history with underlying severe COPD with emphysema she is managed on Trelegy daily.  Last visit she was recommended to begin Ohtuvayre  and stop Daliresp .  She was referred to [facility] transplant team but has not heard from the team. Does not feel Ohtuvayre  has helped and seemed to irritate her throat.  Has started back on Daliresp .  Complains of drippy nose most days , no fever or discolored mucus  Has issue with recurrent hoarseness that comes and goes for long time. Feels throat gets sore and has a lot of upper airway noise. Previous seen by ENT, Dr Darlin Ehrlich.   Patient has underlying sleep apnea.  Is on BiPAP at bedtime with 4 L of oxygen .  BiPAP download shows excellent compliance with 100% usage.  Daily average usage at 13 hours.  She is on BiPAP IPAP 14, EPAP 6 cm H2O.  AHI 0.9.  She remains on oxygen  24/7.  She is on 4 L of oxygen . Will increase 7 l/m with prolonged walking.  No increased oxygen  demands.    Current Outpatient Medications on File Prior to Visit  Medication Sig Dispense Refill   albuterol  (VENTOLIN  HFA) 108 (90 Base) MCG/ACT inhaler Inhale 1-2 puffs into the lungs every 6 (six) hours as needed for wheezing or shortness of breath.     atorvastatin  (LIPITOR) 40 MG  tablet TAKE 1 TABLET DAILY 90 tablet 3   azelastine  (ASTELIN ) 0.1 % nasal spray Place 1 spray into both nostrils daily as needed for rhinitis or allergies. Use in each nostril as directed     Carbinoxamine  Maleate 4 MG TABS Take 1 tablet (4 mg total) by mouth in the morning and at bedtime. 56 tablet 5   carboxymethylcellulose 1 % ophthalmic solution Place 2 drops into both eyes 3 (three) times daily as needed (for dry eyes).     cetirizine (ZYRTEC) 10 MG tablet Take 10 mg by mouth daily.     diltiazem  (DILACOR XR ) 240 MG 24 hr capsule Take 1 capsule (240 mg total) by mouth daily. 90 capsule 1   escitalopram  (LEXAPRO ) 20 MG tablet Take 20 mg by mouth daily.     Fluticasone -Umeclidin-Vilant (TRELEGY ELLIPTA ) 100-62.5-25 MCG/ACT AEPB Inhale 1 puff into the lungs daily.     ibuprofen  (ADVIL ) 200 MG tablet Take 400 mg by mouth every 6 (six) hours as needed for moderate pain.     meloxicam (MOBIC) 15 MG tablet Take 15 mg by mouth daily as needed.     montelukast  (SINGULAIR ) 10 MG tablet Take 1 tablet (10 mg total) by mouth at bedtime. 30 tablet 1   NON FORMULARY Pt uses a bipap with 4L of oxygen  when laying down     oxybutynin  (DITROPAN ) 5 MG tablet Take 5 mg by mouth daily.      OXYGEN  Inhale 3.5-7 L into the  lungs continuous.     roflumilast  (DALIRESP ) 500 MCG TABS tablet Take 500 mcg by mouth daily.     valACYclovir  (VALTREX ) 500 MG tablet Take 500 mg by mouth daily.     No current facility-administered medications on file prior to visit.       Observations/Objective: Spirometry 12/31/11 >> FEV1 0.4 (18%), FEV1% 42    CT chest 10/16/19 >> mod/severe centrilobular emphysema, granuloma RUL  CT chest October 31, 2022 negative for PE, numerous calcified granulomas bilaterally.  Severe emphysema and diffuse bronchial wall thickening   Bipap titration study 05/16/14 >> Bipap 15/10 cm H2O with 3 liters O2 Bipap 02/18/22 to 03/19/22 >> used on 30 of 30 nights with average 14 hrs.  Average AHI 0.7 with  Bipap 14/6 cm H2O. Echo 10/31/20 >> EF 55 to 60%, grade 1 DD   Assessment and Plan: COPD - stable . Very severe -[facility] transplant referral sent -contacted [facility] team, referral was received by [facility], paperwork resent today for evaluation.  Continue on Trelegy . No perceived benefit from Ohtuvayre . Back on Trelegy   O2 RF -continue on O2 to keep O2 sats >90%. Continue on nocturnal BIPAP with O2  OSA-excellent control and compliance on BIPAP with O2 -has perceived benefit.   Hoarseness/ Upper airway irritation-refer to ENT. If exam unrevealing, consider CT chest /neck . ? Tracheobronchomalcia   GERD -continue on PPI, add Pepcid   Hx of smoking -CT chest 10/2022 no suspicious nodules noted.  Plan  Patient Instructions  Continue on Prilosec 20mg  daily  Add Pepcid 20mg  At bedtime   Continue on Zyrtec and Singulair  daily  Add Saline nasal spray Twice daily   Add Saline nasal gel At bedtime   Refer to ENT for hoarseness and throat issues.   Continue on Trelegy 1 puff daily, rinse after use  Change Daliresp  daily  Albuterol  inhaler or neb As needed    Continue on Oxygen  4l/m at rest and 7l/m with activity   Continue on BIPAP At bedtime  with Oxygen  4l/m  Keep up good work.   [facility]Transplant team referral has been received.   Follow up in 3 months and As needed            Follow Up Instructions:    I discussed the assessment and treatment plan with the patient. The patient was provided an opportunity to ask questions and all were answered. The patient agreed with the plan and demonstrated an understanding of the instructions.   The patient was advised to call back or seek an in-person evaluation if the symptoms worsen or if the condition fails to improve as anticipated.  I provided 30  minutes of non-face-to-face time during this encounter.   Roena Clark, NP

## 2023-09-22 DIAGNOSIS — M79641 Pain in right hand: Secondary | ICD-10-CM | POA: Diagnosis not present

## 2023-09-22 DIAGNOSIS — M2540 Effusion, unspecified joint: Secondary | ICD-10-CM | POA: Diagnosis not present

## 2023-09-22 DIAGNOSIS — E663 Overweight: Secondary | ICD-10-CM | POA: Diagnosis not present

## 2023-09-22 DIAGNOSIS — R5383 Other fatigue: Secondary | ICD-10-CM | POA: Diagnosis not present

## 2023-09-22 DIAGNOSIS — M79642 Pain in left hand: Secondary | ICD-10-CM | POA: Diagnosis not present

## 2023-09-22 DIAGNOSIS — M0600 Rheumatoid arthritis without rheumatoid factor, unspecified site: Secondary | ICD-10-CM | POA: Diagnosis not present

## 2023-09-22 DIAGNOSIS — M810 Age-related osteoporosis without current pathological fracture: Secondary | ICD-10-CM | POA: Diagnosis not present

## 2023-09-22 DIAGNOSIS — M2560 Stiffness of unspecified joint, not elsewhere classified: Secondary | ICD-10-CM | POA: Diagnosis not present

## 2023-09-22 DIAGNOSIS — E559 Vitamin D deficiency, unspecified: Secondary | ICD-10-CM | POA: Diagnosis not present

## 2023-09-22 DIAGNOSIS — Z6827 Body mass index (BMI) 27.0-27.9, adult: Secondary | ICD-10-CM | POA: Diagnosis not present

## 2023-10-09 ENCOUNTER — Telehealth: Payer: Self-pay | Admitting: Cardiology

## 2023-10-09 MED ORDER — DILTIAZEM HCL ER 240 MG PO CP24: 240.0000 mg | ORAL_CAPSULE | Freq: Every day | ORAL | 1 refills | Status: DC

## 2023-10-09 NOTE — Telephone Encounter (Signed)
 Pt's medication was sent to pt's pharmacy as requested. Confirmation received.

## 2023-10-09 NOTE — Telephone Encounter (Signed)
*  STAT* If patient is at the pharmacy, call can be transferred to refill team.   1. Which medications need to be refilled? (please list name of each medication and dose if known)   diltiazem  (DILACOR XR ) 240 MG 24 hr capsule     4. Which pharmacy/location (including street and city if local pharmacy) is medication to be sent to?  [facility] - [place] - [address] AT [address] TO [address]     5. Do they need a 30 day or 90 day supply? 90

## 2023-10-21 ENCOUNTER — Ambulatory Visit: Admitting: Student

## 2023-10-28 ENCOUNTER — Telehealth: Payer: Self-pay

## 2023-10-28 NOTE — Telephone Encounter (Signed)
 I returned the patients call from the after ours nurse line regarding scheduling a new patient appointment. I left a voicemail on her home phone advising her our office required a referral to to contact me at [phone] with any questions.

## 2023-12-02 ENCOUNTER — Telehealth (INDEPENDENT_AMBULATORY_CARE_PROVIDER_SITE_OTHER): Payer: Self-pay | Admitting: Otolaryngology

## 2023-12-02 NOTE — Telephone Encounter (Signed)
 LVM to confirm appt & location [id number] afm

## 2023-12-04 ENCOUNTER — Encounter (INDEPENDENT_AMBULATORY_CARE_PROVIDER_SITE_OTHER): Payer: Self-pay | Admitting: Otolaryngology

## 2023-12-04 ENCOUNTER — Ambulatory Visit (INDEPENDENT_AMBULATORY_CARE_PROVIDER_SITE_OTHER): Admitting: Otolaryngology

## 2023-12-04 VITALS — BP 131/82 | HR 88

## 2023-12-04 DIAGNOSIS — R0982 Postnasal drip: Secondary | ICD-10-CM | POA: Diagnosis not present

## 2023-12-04 DIAGNOSIS — K219 Gastro-esophageal reflux disease without esophagitis: Secondary | ICD-10-CM | POA: Diagnosis not present

## 2023-12-04 DIAGNOSIS — J383 Other diseases of vocal cords: Secondary | ICD-10-CM | POA: Diagnosis not present

## 2023-12-04 DIAGNOSIS — R490 Dysphonia: Secondary | ICD-10-CM | POA: Diagnosis not present

## 2023-12-04 DIAGNOSIS — R1310 Dysphagia, unspecified: Secondary | ICD-10-CM

## 2023-12-04 MED ORDER — FLUTICASONE PROPIONATE 50 MCG/ACT NA SUSP: 2.0000 | Freq: Two times a day (BID) | NASAL | 6 refills | Status: AC

## 2023-12-04 MED ORDER — CETIRIZINE HCL 10 MG PO TABS: 10.0000 mg | ORAL_TABLET | Freq: Every day | ORAL | 11 refills | Status: AC

## 2023-12-04 NOTE — Progress Notes (Signed)
 ENT CONSULT:  Reason for Consult: hoarseness   HPI: Discussed the use of AI scribe software for clinical note transcription with the patient, who gave verbal consent to proceed.  History of Present Illness [name] is a 68 year old female with hx of COPD on 24/7 supplemental O2, who is here for hoarseness.  She experiences a persistent, variable-intensity sound, described as a 'horrible sound' similar to belching without sensation of belching.   She denies current smoking and is on continuous oxygen  therapy, prescribed for 4 to 7 liters, currently using 4 or 5 liters. She has a history of feeling like something was catching in her throat when swallowing, which has improved, but she occasionally feels like the anomaly obstructs her airway.  She reports postnasal drainage and is taking Zyrtec  and Singulair  for allergies. She recently started a new medication, though the name is unclear.  She feels like there is a pouch in her throat where medicine gets stuck, described as a sensation of a pouch in her esophagus.  No current issues with swallowing, but past episodes of feeling something catching in her throat. Occasional coughing.  Records Reviewed:  Madelin Stank NP Pulm  istory of Present Illness: 68 year old female former smoker followed for COPD with emphysema and chronic respiratory failure on home oxygen , sleep apnea-on nocturnal BiPAP, REM sleep disorder   Today's video visit is a 66-month follow-up.  Patient has an extensive medical history with underlying severe COPD with emphysema she is managed on Trelegy daily.  Last visit she was recommended to begin Ohtuvayre  and stop Daliresp .  She was referred to [facility] transplant team but has not heard from the team. Does not feel Ohtuvayre  has helped and seemed to irritate her throat.  Has started back on Daliresp .  Complains of drippy nose most days , no fever or discolored mucus  Has issue with recurrent hoarseness that  comes and goes for long time. Feels throat gets sore and has a lot of upper airway noise. Previous seen by ENT, Dr Karis.    Patient has underlying sleep apnea.  Is on BiPAP at bedtime with 4 L of oxygen .  BiPAP download shows excellent compliance with 100% usage.  Daily average usage at 13 hours.  She is on BiPAP IPAP 14, EPAP 6 cm H2O.  AHI 0.9.   She remains on oxygen  24/7.  She is on 4 L of oxygen . Will increase 7 l/m with prolonged walking.  No increased oxygen  demands.     Past Medical History:  Diagnosis Date   Allergy    Anemia    Anxiety    Arthritis    COPD (chronic obstructive pulmonary disease) (HCC)    Home oxygen  at [facility]   COPD exacerbation (HCC)    Depression    Diverticulosis    Dyspnea    Dysrhythmia    GERD (gastroesophageal reflux disease)    History of cardiac catheterization    No significant CAD 2005   Hyperlipidemia    Hypoglycemia    IBS (irritable bowel syndrome)    Neuromuscular disorder (HCC)    Osteoporosis    Paroxysmal SVT (supraventricular tachycardia) (HCC)    Pneumonia 2015   Pre-diabetes    Shingles 2012   Sleep apnea     Past Surgical History:  Procedure Laterality Date   BACK SURGERY     BIOPSY  09/12/2021   Procedure: BIOPSY;  Surgeon: Golda Claudis PENNER, MD;  [facility];  Service: Endoscopy;;  BREAST SURGERY     CARDIAC CATHETERIZATION  2004   COLONOSCOPY  03/22/2011   Procedure: COLONOSCOPY;  Surgeon: Claudis RAYMOND Rivet, MD;  [facility];  Service: Endoscopy;  Laterality: N/A;   COLONOSCOPY WITH PROPOFOL  N/A 05/16/2017   Procedure: COLONOSCOPY WITH PROPOFOL ;  Surgeon: Rivet Claudis RAYMOND, MD;  [facility];  Service: Endoscopy;  Laterality: N/A;   COLONOSCOPY WITH PROPOFOL  N/A 09/12/2021   Procedure: COLONOSCOPY WITH PROPOFOL ;  Surgeon: Rivet Claudis RAYMOND, MD;  [facility];  Service: Endoscopy;  Laterality: N/A;  1230 ASA 2   ESOPHAGEAL DILATION N/A 04/19/2020   Procedure: ESOPHAGEAL  DILATION;  Surgeon: Rivet Claudis RAYMOND, MD;  [facility];  Service: Endoscopy;  Laterality: N/A;   ESOPHAGOGASTRODUODENOSCOPY (EGD) WITH PROPOFOL  N/A 04/19/2020   Procedure: ESOPHAGOGASTRODUODENOSCOPY (EGD) WITH PROPOFOL ;  Surgeon: Rivet Claudis RAYMOND, MD;  [facility];  Service: Endoscopy;  Laterality: N/A;  9:15   ESOPHAGOGASTRODUODENOSCOPY (EGD) WITH PROPOFOL  N/A 09/12/2021   Procedure: ESOPHAGOGASTRODUODENOSCOPY (EGD) WITH PROPOFOL ;  Surgeon: Rivet Claudis RAYMOND, MD;  [facility];  Service: Endoscopy;  Laterality: N/A;   FLEXIBLE SIGMOIDOSCOPY N/A 09/26/2017   Procedure: FLEXIBLE SIGMOIDOSCOPY with Propofol  ;  Surgeon: Rivet Claudis RAYMOND, MD;  [facility];  Service: Endoscopy;  Laterality: N/A;  9:30   FLEXIBLE SIGMOIDOSCOPY N/A 06/04/2022   Procedure: FLEXIBLE SIGMOIDOSCOPY;  Surgeon: Eartha Angelia Sieving, MD;  [facility];  Service: Gastroenterology;  Laterality: N/A;  10:30am, asa 3   OSTOMY N/A 02/07/2022   Procedure: POSSIBLE OSTOMY;  Surgeon: Sheldon Standing, MD;  Location: [facility];  Service: General;  Laterality: N/A;   POLYPECTOMY  05/16/2017   Procedure: POLYPECTOMY;  Surgeon: Rivet Claudis RAYMOND, MD;  [facility];  Service: Endoscopy;;   POLYPECTOMY  09/12/2021   Procedure: POLYPECTOMY INTESTINAL;  Surgeon: Rivet Claudis RAYMOND, MD;  [facility];  Service: Endoscopy;;   PROCTOSCOPY N/A 02/07/2022   Procedure: RIGID PROCTOSCOPY;  Surgeon: Sheldon Standing, MD;  Location: [facility];  Service: General;  Laterality: N/A;   SPINAL FUSION     TOTAL HIP ARTHROPLASTY Left 07/15/2022   Procedure: TOTAL HIP ARTHROPLASTY;  Surgeon: Edna Sieving LABOR, MD;  Location: [facility];  Service: Orthopedics;  Laterality: Left;   VULVECTOMY      Family History  Problem Relation Age of Onset   Asthma Mother    Heart attack Mother    Diabetes Mother    Heart failure Mother    Colon cancer Neg Hx     Social History:  reports that she  quit smoking about 8 years ago. Her smoking use included cigarettes. She started smoking about 48 years ago. She has a 40 pack-year smoking history. She has been exposed to tobacco smoke. She has never used smokeless tobacco. She reports current alcohol  use. She reports that she does not use drugs.  Allergies:  Allergies  Allergen Reactions   Ketek [Telithromycin] Other (See Comments)    Loopy, eyes went different directions   Penicillins Other (See Comments)    Unknown, childhood allergy   Adhesive [Tape] Itching and Rash    Break out   Chantix [Varenicline] Palpitations    Medications: I have reviewed the patient's current medications.  The PMH, PSH, Medications, Allergies, and SH were reviewed and updated.  ROS: Constitutional: Negative for fever, weight loss and weight gain. Cardiovascular: Negative for chest pain and dyspnea on exertion. Respiratory: Is not experiencing shortness of breath at  rest. Gastrointestinal: Negative for nausea and vomiting. Neurological: Negative for headaches. Psychiatric: The patient is not nervous/anxious  Blood pressure 131/82, pulse 88, SpO2 96%. There is no height or weight on file to calculate BMI.  PHYSICAL EXAM:  Exam: General: Well-developed, well-nourished Communication and Voice: Clear pitch and clarity Respiratory Respiratory effort: Equal inspiration and expiration without stridor Cardiovascular Peripheral Vascular: Warm extremities with equal color/perfusion Eyes: No nystagmus with equal extraocular motion bilaterally Neuro/Psych/Balance: Patient oriented to person, place, and time; Appropriate mood and affect; Gait is intact with no imbalance; Cranial nerves I-XII are intact Head and Face Inspection: Normocephalic and atraumatic without mass or lesion Palpation: Facial skeleton intact without bony stepoffs Salivary Glands: No mass or tenderness Facial Strength: Facial motility symmetric and full bilaterally ENT Pinna:  External ear intact and fully developed External canal: Canal is patent with intact skin Tympanic Membrane: Clear and mobile External Nose: No scar or anatomic deformity Internal Nose: Septum is deviated to the left with narrowing of the left side. No polyp, or purulence. Mucosal edema and erythema present.  Bilateral inferior turbinate hypertrophy.  Lips, Teeth, and gums: Mucosa and teeth intact and viable TMJ: No pain to palpation with full mobility Oral cavity/oropharynx: No erythema or exudate, no lesions present - cleft palate  Nasopharynx: No mass or lesion with intact mucosa Hypopharynx: Intact mucosa with minimal pooling of secretions in left pyriform Larynx Glottic: Full true vocal cord mobility without lesion or mass VF atrophy and glottic insufficiency Supraglottic: Normal appearing epiglottis and AE folds Interarytenoid Space: Moderate pachydermia&edema Subglottic Space: Patent without lesion or edema Neck Neck and Trachea: Midline trachea without mass or lesion Thyroid : No mass or nodularity Lymphatics: No lymphadenopathy  Procedure:  Preoperative diagnosis: hoarseness  Postoperative diagnosis:   same  Procedure: Flexible fiberoptic laryngoscopy with stroboscopy (68420)   Surgeon: Ansel Ferrall, MD  Anesthesia: Topical lidocaine  and Afrin  Complications: None  Condition is stable throughout exam  Indications and consent:   The patient presents to the clinic with hoarseness. All the risks, benefits, and potential complications were reviewed with the patient preoperatively and informed verbal consent was obtained.  Procedure: The patient was seated upright in the exam chair.   Topical lidocaine  and Afrin were applied to the nasal cavity. After adequate anesthesia had occurred, the flexible telescope with strobe capabilities was passed into the nasal cavity. The nasopharynx was patent without mass or lesion. The scope was passed behind the soft palate and directed  toward the base of tongue. The base of tongue was visualized and was symmetric with no apparent masses or abnormal appearing tissue. There were no signs of a mass or pooling of secretions in the piriform sinuses. The supraglottic structures were normal.  The true vocal cords are mobile. The medial edges were bowed. Closure was incomplete. Periodicity present. The mucosal wave and amplitude were intact and normal symmetric. There is moderate interarytenoid pachydermia and post cricoid edema. The mucosa appears without lesions.   The laryngoscope was then slowly withdrawn and the patient tolerated the procedure well. There were no complications or blood loss.        Studies Reviewed: CXR 02/20/23 Redemonstration of extensive subcentimeter calcified granulomas throughout bilateral lungs. Bilateral lungs appear hyperexpanded and hyperlucent with coarse bronchovascular markings, in keeping with COPD. Bilateral lungs otherwise appear clear. No dense consolidation or lung collapse. Bilateral costophrenic angles are clear.   Normal cardio-mediastinal silhouette.   No acute osseous abnormalities.   The soft tissues are within normal limits.  IMPRESSION: 1. No radiographic evidence of acute cardiopulmonary disease. 2. COPD.  Assessment/Plan: Encounter Diagnoses  Name Primary?   Glottic insufficiency Yes   Dysphonia    Hoarseness    Age-related vocal fold atrophy    Dysphagia, unspecified type     Assessment and Plan Assessment & Plan Chronic dysphonia Hx of productive cough and COPD, former smoker. Strobe exam without lesions but evidence of VF atrophy and glottic insufficiency. We discussed voice therapy but at this time she would like to hold off - monitor for now  Dysphagia to pills sensation of a pouch in her throat She had mild pooling of secretions in left pyriform. Will obtain esophagram to rule out Zenker's.  - esophagram.  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  - observe for now   Chronic nasal congestion and post-nasal drainage Evidence of post-nasal drainage during flexible scope exam today, could be contributing to her sx - trial of Zyrtec  10 mg daily and Flonase  2 puffs b/l nares BID - consider nasal saline rinses   GERD LPR -  Reflux Gourmet after meals - diet and lifestyle changes to minimize GERD - Refer to [employer] blog for dietary and lifestyle modifications/reflux cook book   Thank you for allowing me to participate in the care of this patient. Please do not hesitate to contact me with any questions or concerns.   Elena Larry, MD Otolaryngology [facility] Phone: [phone] Fax: [phone]    12/04/2023, 11:28 AM

## 2023-12-05 ENCOUNTER — Telehealth: Payer: Self-pay

## 2023-12-05 NOTE — Telephone Encounter (Signed)
 Contact patient's referring provider and spoke to Celeste. Sari state's their office do blood work with their urine and patient got blood work done. Sari state's that she only have the two urinalysis that were sent to us .

## 2023-12-05 NOTE — Telephone Encounter (Signed)
-----   Message from Lauraine JAYSON Oz sent at 12/05/2023 12:04 PM EDT ----- Pt coming Monday 12/08/23 as new pt for UTls. Per referral notes she has been treated for UTIs 3x in the past 6 months, but I can only find 1 positive urine culture in the past year:  Urine culture results in past 12 months: - 02/20/2023: Positive for E. coli - 05/19/2023: Negative   Please contact referring provider for more urine culture results. Thanks!

## 2023-12-05 NOTE — Progress Notes (Signed)
 Name: [name] DOB: [birth year] MRN: [id number]  History of Present Illness: [name] is a 68 y.o. female who presents today as a new patient at [facility]. All available relevant medical records have been reviewed.   She reports chief complaints of recurrent UTls and urinary incontinence.  Urine culture results in past 12 months: - 02/20/2023: Positive for E. coli - 05/19/2023: Negative  Relevant imaging: - 06/02/2023: RUS showed no stones, masses, or hydronephrosis.   Urinary Symptoms: She reports 3-4 UTl's in the last year. When present, UTI symptoms include dysuria, increased urinary urgency, frequency.  She denies acute UTI symptoms today.  At baseline: She reports urinary urgency, frequency, and mixed urinary incontinence. Denies dysuria, gross hematuria, hesitancy, straining to void, or sensations of incomplete emptying. She reports voiding 15-20 times per day and 1-2 times at night. She leaks all day long. Wears 2 diapers per day. She states the UUI is predominant. This has been going on for years and has been progressively worsening over the past 6 months. In terms of treatment, She has been on Ditropan  (Oxybutynin ) 5 mg daily for several years and notes that it has been helpful. She reports caffeine intake; about 3 caffeinated beverages per day on average.  She denies history of kidney stones.  Reports distant history >30 years ago of urethral dilation. To her recollection that was performed for urinary urgency, possibly for incomplete bladder emptying.   Vaginal / prolapse Symptoms: She denies vaginal bulge sensation.  She denies seeing a vaginal bulge. She denies pushing on a bulge in order to empty her bladder.  She denies vaginal pain, bleeding, or discharge.  She denies use of topical vaginal estrogen cream.  Bowel Symptoms: She reports IBS with alternating constipation and diarrhea.   Past OB/GYN History: OB History      Gravida  1   Para  1   Term  1   Preterm      AB      Living  2      SAB      IAB      Ectopic      Multiple  1   Live Births             She is post menopausal.   Medications: Current Outpatient Medications  Medication Sig Dispense Refill   albuterol  (VENTOLIN  HFA) 108 (90 Base) MCG/ACT inhaler Inhale 1-2 puffs into the lungs every 6 (six) hours as needed for wheezing or shortness of breath.     atorvastatin  (LIPITOR) 40 MG tablet TAKE 1 TABLET DAILY 90 tablet 3   azelastine  (ASTELIN ) 0.1 % nasal spray Place 1 spray into both nostrils daily as needed for rhinitis or allergies. Use in each nostril as directed     Carbinoxamine  Maleate 4 MG TABS Take 1 tablet (4 mg total) by mouth in the morning and at bedtime. 56 tablet 5   carboxymethylcellulose 1 % ophthalmic solution Place 2 drops into both eyes 3 (three) times daily as needed (for dry eyes).     cetirizine  (ZYRTEC ) 10 MG tablet Take 1 tablet (10 mg total) by mouth daily. 30 tablet 11   diltiazem  (DILACOR XR ) 240 MG 24 hr capsule Take 1 capsule (240 mg total) by mouth daily. 90 capsule 1   escitalopram  (LEXAPRO ) 20 MG tablet Take 20 mg by mouth daily.     estradiol  (ESTRACE ) 0.1 MG/GM vaginal cream Discard plastic applicator. Insert a blueberry size amount (approximately 1 gram)  of cream on fingertip inside vagina at bedtime every night for 1 week then every other night. For long term use. 30 g 3   fluticasone  (FLONASE ) 50 MCG/ACT nasal spray Place 2 sprays into both nostrils 2 (two) times daily. 16 g 6   Fluticasone -Umeclidin-Vilant (TRELEGY ELLIPTA ) 100-62.5-25 MCG/ACT AEPB Inhale 1 puff into the lungs daily.     ibuprofen  (ADVIL ) 200 MG tablet Take 400 mg by mouth every 6 (six) hours as needed for moderate pain.     meloxicam (MOBIC) 15 MG tablet Take 15 mg by mouth daily as needed.     montelukast  (SINGULAIR ) 10 MG tablet Take 1 tablet (10 mg total) by mouth at bedtime. 30 tablet 1   NON FORMULARY Pt uses a bipap  with 4L of oxygen  when laying down     OXYGEN  Inhale 3.5-7 L into the lungs continuous.     roflumilast  (DALIRESP ) 500 MCG TABS tablet Take 500 mcg by mouth daily.     Vibegron  (GEMTESA ) 75 MG TABS Take 1 tablet (75 mg total) by mouth daily. 30 tablet 11   Vitamin D, Ergocalciferol, (DRISDOL) 1.25 MG (50000 UNIT) CAPS capsule Take 50,000 Units by mouth once a week.     valACYclovir  (VALTREX ) 500 MG tablet Take 500 mg by mouth daily.     No current facility-administered medications for this visit.    Allergies: Allergies  Allergen Reactions   Ketek [Telithromycin] Other (See Comments)    Loopy, eyes went different directions   Penicillins Other (See Comments)    Unknown, childhood allergy   Adhesive [Tape] Itching and Rash    Break out   Chantix [Varenicline] Palpitations    Past Medical History:  Diagnosis Date   Allergy    Anemia    Anxiety    Arthritis    COPD (chronic obstructive pulmonary disease) (HCC)    Home oxygen  at [facility]   COPD exacerbation (HCC)    Depression    Diverticulosis    Dyspnea    Dysrhythmia    GERD (gastroesophageal reflux disease)    History of cardiac catheterization    No significant CAD 2005   Hyperlipidemia    Hypoglycemia    IBS (irritable bowel syndrome)    Neuromuscular disorder (HCC)    Osteoporosis    Paroxysmal SVT (supraventricular tachycardia) (HCC)    Pneumonia 2015   Pre-diabetes    Shingles 2012   Sleep apnea    Past Surgical History:  Procedure Laterality Date   BACK SURGERY     BIOPSY  09/12/2021   Procedure: BIOPSY;  Surgeon: Golda Claudis PENNER, MD;  [facility];  Service: Endoscopy;;   BREAST SURGERY     CARDIAC CATHETERIZATION  2004   COLONOSCOPY  03/22/2011   Procedure: COLONOSCOPY;  Surgeon: Claudis PENNER Golda, MD;  [facility];  Service: Endoscopy;  Laterality: N/A;   COLONOSCOPY WITH PROPOFOL  N/A 05/16/2017   Procedure: COLONOSCOPY WITH PROPOFOL ;  Surgeon: Golda Claudis PENNER, MD;  [facility];  Service: Endoscopy;  Laterality: N/A;   COLONOSCOPY WITH PROPOFOL  N/A 09/12/2021   Procedure: COLONOSCOPY WITH PROPOFOL ;  Surgeon: Golda Claudis PENNER, MD;  [facility];  Service: Endoscopy;  Laterality: N/A;  1230 ASA 2   ESOPHAGEAL DILATION N/A 04/19/2020   Procedure: ESOPHAGEAL DILATION;  Surgeon: Golda Claudis PENNER, MD;  [facility];  Service: Endoscopy;  Laterality: N/A;   ESOPHAGOGASTRODUODENOSCOPY (EGD) WITH PROPOFOL  N/A 04/19/2020   Procedure: ESOPHAGOGASTRODUODENOSCOPY (EGD) WITH  PROPOFOL ;  Surgeon: Golda Claudis PENNER, MD;  [facility];  Service: Endoscopy;  Laterality: N/A;  9:15   ESOPHAGOGASTRODUODENOSCOPY (EGD) WITH PROPOFOL  N/A 09/12/2021   Procedure: ESOPHAGOGASTRODUODENOSCOPY (EGD) WITH PROPOFOL ;  Surgeon: Golda Claudis PENNER, MD;  [facility];  Service: Endoscopy;  Laterality: N/A;   FLEXIBLE SIGMOIDOSCOPY N/A 09/26/2017   Procedure: FLEXIBLE SIGMOIDOSCOPY with Propofol  ;  Surgeon: Golda Claudis PENNER, MD;  [facility];  Service: Endoscopy;  Laterality: N/A;  9:30   FLEXIBLE SIGMOIDOSCOPY N/A 06/04/2022   Procedure: FLEXIBLE SIGMOIDOSCOPY;  Surgeon: Eartha Angelia Sieving, MD;  [facility];  Service: Gastroenterology;  Laterality: N/A;  10:30am, asa 3   OSTOMY N/A 02/07/2022   Procedure: POSSIBLE OSTOMY;  Surgeon: Sheldon Standing, MD;  Location: [facility];  Service: General;  Laterality: N/A;   POLYPECTOMY  05/16/2017   Procedure: POLYPECTOMY;  Surgeon: Golda Claudis PENNER, MD;  [facility];  Service: Endoscopy;;   POLYPECTOMY  09/12/2021   Procedure: POLYPECTOMY INTESTINAL;  Surgeon: Golda Claudis PENNER, MD;  [facility];  Service: Endoscopy;;   PROCTOSCOPY N/A 02/07/2022   Procedure: RIGID PROCTOSCOPY;  Surgeon: Sheldon Standing, MD;  Location: [facility];  Service: General;  Laterality: N/A;   SPINAL FUSION     TOTAL HIP ARTHROPLASTY Left 07/15/2022   Procedure: TOTAL HIP ARTHROPLASTY;  Surgeon:  Edna Sieving LABOR, MD;  Location: [facility];  Service: Orthopedics;  Laterality: Left;   VULVECTOMY     Family History  Problem Relation Age of Onset   Asthma Mother    Heart attack Mother    Diabetes Mother    Heart failure Mother    Colon cancer Neg Hx    Social History   Socioeconomic History   Marital status: Divorced    Spouse name: Not on file   Number of children: Not on file   Years of education: Not on file   Highest education level: Not on file  Occupational History   Not on file  Tobacco Use   Smoking status: Former    Current packs/day: 0.00    Average packs/day: 1 pack/day for 40.0 years (40.0 ttl pk-yrs)    Types: Cigarettes    Start date: 05/07/1975    Quit date: 05/07/2015    Years since quitting: 8.5    Passive exposure: Past   Smokeless tobacco: Never  Vaping Use   Vaping status: Former  Substance and Sexual Activity   Alcohol  use: Yes    Comment: 1 -2 glasses of occasional wine   Drug use: No   Sexual activity: Not Currently  Other Topics Concern   Not on file  Social History Narrative   Daughter is MD [employer], radiologist in [place])   Social Drivers of Health   Financial Resource Strain: Not on file  Food Insecurity: No Food Insecurity (02/21/2023)   Hunger Vital Sign    Worried About Running Out of Food in the Last Year: Never true    Ran Out of Food in the Last Year: Never true  Transportation Needs: No Transportation Needs (02/21/2023)   PRAPARE - Administrator, Civil Service (Medical): No    Lack of Transportation (Non-Medical): No  Physical Activity: Not on file  Stress: Not on file  Social Connections: Not on file  Intimate Partner Violence: Not At Risk (02/21/2023)   Humiliation, Afraid, Rape, and Kick questionnaire    Fear of Current or Ex-Partner: No  Emotionally Abused: No    Physically Abused: No    Sexually Abused: No    SUBJECTIVE  Review of Systems Constitutional: Patient denies any  unintentional weight loss or change in strength lntegumentary: Patient denies any rashes or pruritus Cardiovascular: Patient denies chest pain or syncope Respiratory: Patient denies shortness of breath ENT: Reports dry eyes Gastrointestinal: Reports dry mouth. Reports alternating constipation and diarrhea.   Musculoskeletal: Patient denies muscle cramps or weakness Neurologic: Patient denies convulsions or seizures Allergic/Immunologic: Patient denies recent allergic reaction(s) Hematologic/Lymphatic: Patient denies bleeding tendencies Endocrine: Patient denies heat/cold intolerance  GU: As per HPI.  OBJECTIVE Vitals:   12/08/23 1116  BP: 131/68  Pulse: 92   There is no height or weight on file to calculate BMI.  Physical Examination Constitutional: No obvious distress; patient is non-toxic appearing  Cardiovascular: No visible lower extremity edema.  Respiratory: The patient does not have audible wheezing/stridor; respirations do not appear labored  Gastrointestinal: Abdomen non-distended Musculoskeletal: Normal ROM of UEs  Skin: No obvious rashes/open sores  Neurologic: CN 2-12 grossly intact Psychiatric: Answered questions appropriately with normal affect  Hematologic/Lymphatic/Immunologic: No obvious bruises or sites of spontaneous bleeding  Urine microscopy: 0 WBC/hpf, 3-10 RBC/hpf, 0 bacteria PVR: 6 ml  ASSESSMENT OAB (overactive bladder) - Plan: BLADDER SCAN AMB NON-IMAGING, Urinalysis, Routine w reflex microscopic, estradiol  (ESTRACE ) 0.1 MG/GM vaginal cream, Vibegron  (GEMTESA ) 75 MG TABS  History of recurrent UTI (urinary tract infection) - Plan: estradiol  (ESTRACE ) 0.1 MG/GM vaginal cream  Microscopic hematuria - Plan: estradiol  (ESTRACE ) 0.1 MG/GM vaginal cream  Former smoker  Atrophic vaginitis - Plan: estradiol  (ESTRACE ) 0.1 MG/GM vaginal cream  Caffeine use  History of UTls with insufficient urine culture data to formally validate recurrent UTI  diagnosis:  We discussed the possible etiologies of recurrent UTls including ascending infection related to intercourse; vaginal atrophy; transmural infection that has been treated incompletely; urinary tract stones; incomplete bladder emptying with urinary stasis; kidney or bladder tumor; urethral diverticulum; and colonization of  vagina and urinary tract with pathologic, adherent organisms.   We reviewed the diagnostic criteria for recurrent UTI and that we do not currently have adequate urine culture data to support or refute the formal diagnosis of recurrent UTI. Considered other possible etiologies for urinary symptoms including but not limited to: non-infectious cystitis (such as interstitial cystitis), stone, mass, malignancy, vulvovaginal irritation, etc. She was advised to contact our office for lab visit if/when UTI symptoms occur to provide urine specimen for evaluation and possible antibiotic treatment.  For UTI prevention advised: > Maintain adequate fluid intake daily to flush out the urinary tract. > Go to the bathroom to urinate every 4-6 hours while awake to minimize urinary stasis / bacterial overgrowth in the bladder. > Starting topical vaginal estrogen cream. The rationale, appropriate use, and potential pros / cons were discussed in detail.   Handout provided with additional recommendations / options for UTI prevention.   2. Microscopic hematuria:  We discussed possible etiologies including but not limited to: vigorous exercise, sexual activity, stone, trauma, blood thinner use, urinary tract infection, urethral irritation secondary to vaginal atrophy, chronic kidney disease, glomerulonephropathy, malignancy. We discussed pt's former nicotine use as a risk factor for GU cancer and encouraged continued cessation.   Based on history and individual risk factors, patient was advised that the recommended workup includes CT urogram and cystoscopy per the AUA/SUFU 2025 Microhematuria  guideline. Ultimately we agreed to reassess after 6-12 weeks of topical vaginal estrogen cream use to see if her  microscopic hematuria resolves with treatment of vaginal atrophy / genitourinary syndrome of menopause (GSM). If not then we will plan to proceed with CT and cystoscopy for further evaluation.   3. OAB with urinary frequency, nocturia, urgency, and urge incontinence. We discussed the symptoms of overactive bladder (OAB), which include urinary urgency, frequency, nocturia, with or without urge incontinence.  While we may not know the exact etiology of OAB, several risk factors can be identified.  - Patient's neurogenic risk factors: former nicotine use. - Patient's exacerbating factors include: caffeine intake, ambulatory dysfunction (functional incontinence), OSA.   We discussed the following management options in detail including potential benefits, risks, and side effects: Behavioral therapy: Modify fluid intake Minimize / avoid bladder irritants (such as caffeine)  Medication(s): - Not a safe candidate for anticholinergic medications due to risk for side effects based on patient's age, comorbidities, and pre-existing dry mouth, dry eyes, constipation. Advised to discontinue Ditropan . - Beta-3 agonist medications: We discussed potential side effects of beta-3 agonist medications such as urinary retention and (infrequently) elevated blood pressure.   She decided to proceed with trial of Gemtesa  (Vibegron ) 75 mg daily and work on behavioral modifications including minimizing / avoiding caffeine intake.   We agreed to plan for follow up in 6 weeks or sooner if needed.  Patient verbalized understanding of and agreement with current plan. All questions were answered.  PLAN Advised the following: 1. Start topical vaginal estrogen cream as prescribed. 2. Discontinue Ditropan  (Oxybutynin ) 5 mg daily. 3. Start Gemtesa  (Vibegron ) 75 mg daily.  4. Minimize / avoid caffeine intake. 5.  Return in about 6 weeks (around 01/19/2024) for OAB, Recurrent UTI, with UA & PVR.  Orders Placed This Encounter  Procedures   Urinalysis, Routine w reflex microscopic   BLADDER SCAN AMB NON-IMAGING   Total time spent caring for the patient today was over 45 minutes. This includes time spent on the date of the visit reviewing the patient's chart before the visit, time spent during the visit, and time spent after the visit on documentation. Over 50% of that time was spent in face-to-face time with this patient for direct counseling. E&M based on time and complexity of medical decision making.  It has been explained that the patient is to follow regularly with their PCP in addition to all other providers involved in their care and to follow instructions provided by these respective offices. Patient advised to contact urology clinic if any urologic-pertaining questions, concerns, new symptoms or problems arise in the interim period.  Patient Instructions       Overactive bladder (OAB) overview for patients:  Symptoms may include: urinary urgency (gotta go feeling) urinary frequency (voiding >8 times per day) night time urination (nocturia) urge incontinence of urine (UUI)  While we do not know the exact etiology of OAB, several treatment options exist including:  Behavioral therapy: Reducing fluid intake Decreasing bladder stimulants (such as caffeine) and irritants (such as acidic food, spicy foods, alcohol ) Urge suppression strategies Bladder retraining via timed voiding  Pelvic floor physical therapy  Medication(s) - can use one or both of the drug classes below. Anticholinergic / antimuscarinic medications:  Mechanism of action: Activate M3 receptors to reduce detrusor stimulation and increase bladder capacity   (parasympathetic nervous system). Effect: Relaxes the bladder to decrease overactivity, increase bladder storage capacity, and increase time between voids. Onset:  Slow acting (may take 8-12 weeks to determine efficacy). Medications include: Vesicare (Solifenacin), Ditropan  (Oxybutynin ), Detrol (Tolterodine), Toviaz (Fesoterodine), Sanctura (Trospium), Urispas (Flavoxate),  Enablex (Darifenacin), Bentyl  (Dicyclomine ), Levsin  (Hyoscyamine  ). Potential side effects include but are not limited to: Dry eyes, dry mouth, constipation, cognitive impairment, dementia risk with long term use, and urinary retention/ incomplete bladder emptying. Insurance companies generally prefer for patients to try 1-2 anticholinergic / antimuscarinic medications first due to low cost. Some exceptions are made based on patient-specific comorbidities / risk factors. Beta-3 agonist medications: Mechanism of action: Stimulates selective B3 adrenergic receptors to cause smooth muscle bladder relaxation (sympathetic nervous system). Effect: Relaxes the bladder to decrease overactivity, increase bladder storage capacity, and increase time between voids. Onset: Slow acting (may take 8-12 weeks to determine efficacy). Medications include: Myrbetriq (Mirabegron) and Vibegron  (Gemtesa ). Potential side effects include but are not limited to: urinary retention / incomplete bladder emptying and elevated blood pressure (more likely to occur in individuals with pre-existing uncontrolled hypertension). These medications tend to be more expensive than the anticholinergic / antimuscarinic medications.   For patients with refractory OAB (if the above treatment options have been unsuccessful): Posterior tibial nerve stimulation (PTNS). Small acupuncture-type needle inserted near ankle with electric current to stimulate bladder via posterior tibial nerve pathway. Initially requires 12 weekly in-office treatments lasting 30 minutes each; followed by monthly in-office treatments lasting 30 minutes each for 1 year.  Bladder Botox injections. How it is done: Typically done via in-office cystoscopy; sometimes  done in the OR depending on the situation. The bladder is numbed with lidocaine  instilled via a catheter. Then the urologist injects Botox into the bladder muscle wall in about 20 locations. Causes local paralysis of the bladder muscle at the injection sites to reduce bladder muscle overactivity / spasms. The effect lasts for approximately 6 months and cannot be reversed once performed. Risks may included but are not limited to: infection, incomplete bladder emptying/ urinary retention, short term need for self-catheterization or indwelling catheter, and need for repeat therapy. There is a 5-12% chance of needing to catheterize with Botox - that usually resolves in a few months as the Botox wears off. Typically Botox injections would need to be repeated every 3-12 months since this is not a permanent therapy.  Sacral neuromodulation trial (Medtronic lnterStim or Axonics implant). Sacral neuromodulation is FDA-approved for uncontrolled urinary urgency, urinary frequency, urinary urge incontinence, non-obstructive urinary retention, or fecal incontinence. It is not FDA-approved as a treatment for pain. The goal of this therapy is at least a 50% improvement in symptoms. It is NOT realistic to expect a 100% cure. This is a a 2-step outpatient procedure. After a successful test period, a permanent wire and generator are placed in the OR. We discussed the risk of infection. We reviewed the fact that about 30% of patients fail the test phase and are not candidates for permanent generator placement. During the 1-2 week trial phase, symptoms are documented by the patient to determine response. If patient gets at least a 50% improvement in symptoms, they may then proceed with Step 2. Step 1: Trial lead placement. Per physician discretion, may done one of two ways: Percutaneous nerve evaluation (PNE) in the [facility] urology office. Performed by urologist under local anesthesia (numbing the area with lidocaine ) using a  spinal needle for placement of test wire, which usually stays in place for 5-7 days to determine therapy response. Test lead placement in OR under anesthesia. Usually stays in place 2 weeks to determine therapy response. > Step 2: Permanent implantation of sacral neuromodulation device, which is performed in the OR.  Sacral neuromodulation implants: All are conditionally  MRI safe. Manufacturer: Medtronic Website: [employer] therapy/right-for-you.html Options: lnterStim X: Non-rechargeable. The battery lasts 10 years on average. lnterStim Micro: Rechargeable. The battery lasts 15 years on average and must be charged routinely. Approximately 50% smaller implant than lnterStim X implant.  Manufacturer: Axonics Website: Findrealrelief.axonics.com Options: Non-rechargeable (Axonics F15): The battery lasts 15 years on average. Rechargeable (Axonics R20): The battery lasts 20 years on average and must be charged in office for about 1 hour every 6-10 months on average. Approximately 50% smaller implant than Axonics non-rechargeable implant.  Note: Generally the rechargeable devices are only advised for very small or thin patients who may not have sufficient adipose tissue to comfortably overlay the implanted device.  Suprapubic catheter (SP tube) placement. Only done in severely refractory OAB when all other options have failed or are not a viable treatment choice depending on patient factors. Involves placement of a catheter through the lower abdomen into the bladder to continuously drain the bladder into an external collection bag, which patient can then empty at their convenience every few hours. Done via an outpatient surgical procedure in the OR under anesthesia. Risks may included but are not limited to: surgical site pain, infections, skin irritation / breakdown, chronic bacteriuria, symptomatic UTls. The SP  tube must stay in place continuously. This is a reversible procedure however - the insertion site will close if catheter is removed for more than a few hours. The SP tube must be exchanged routinely every 4 weeks to prevent the catheter from becoming clogged with sediment. SP tube exchanges are typically performed at a urology nurse visit or by a home health nurse.           UTI prevention / management:  UTI symptoms may include:  - Pain / burning / discomfort when urinating - Recent increase in urinary urgency (how quickly you feel like you need to rush to the bathroom) - Recent increase in urinary frequency (how often you are urinating) - Fever - Acute mental status change / confusion - Fatigue / Feeling tired - Weakness - Note: Urine color, clarity, and odor are not considered to be clinically significant indicators of UTI and do not warrant urine testing unless patient is also experiencing UTI symptoms such as those listed above.  Difference between Urinalysis (urine dipstick test) and Urine culture / Why urine culture often needed to determine appropriate diagnosis and treatment of urologic symptoms: > Urinalysis (urine dipstick test): A quick office test used as an indicator to determine whether or not further testing is necessary (such as a urine culture, urine microscopy, etc.) The urinalysis cannot differentiate a true bacterial UTI or give a definitive diagnosis for the findings.  > Urine culture: May be performed based on the findings of a urinalysis to evaluate for UTI. Grows out on a petri dish for 48-72 hours. Provides important information about: whether or not bacterial growth is present and if so: what the predominant bacteria is which antibiotics will work best against that bacteria That information is important so that we can diagnose and treat patients appropriately as there are other conditions which may mimic UTls which must not be missed (such as cancer,  interstitial cystitis, stones, etc.). Assists us  with antibiotic stewardship to minimize patient's risk for developing antibiotic resistance (getting to a point where no antibiotics work anymore).  Options when UTI symptoms occur: 1. Call [facility] [place] and request to speak with triage nurse (phone # [phone], select option 3). In accordance with clinic guidelines the nurse  will determine next steps based on patient-reported symptoms, which may include: same-day lab visit to provide urine specimen, recommendation to schedule Urology office visit appointment for further evaluation, recommendation to proceed to ER, etc. 2. Call your Primary Care Provider (PCP) office to request urgent / same-day visit. Be sure to request for urine culture to be ordered and have results faxed to Urology (fax # [phone]).  3. Go to urgent care. Be sure to request for urine culture to be ordered and have results faxed to Urology (fax # [phone]).   For bladder pain/ burning with urination: - Can take over-the-counter Pyridium (phenazopyridine; commonly known under the AZO brand) for a few days as needed. Limit use to no more than 3 days consecutively due to risk for methemoglobinemia, liver function issues, and bone health damage with long term use of Pyridium. - Alternative: Prescription urinary analgesics (such as Uribel, Urogesic blue, Urelle, Uro-MP). Often expensive / poorly covered by insurance unfortunately.  Options / recommendations for UTI prevention: - Topical vaginal estrogen for vaginal atrophy (aka Genitourinary Syndrome of Menopause (GSM)). - Adequate daily fluid intake to flush out the urinary tract. - Go to the bathroom to urinate every 4-6 hours while awake to minimize urinary stasis / bacterial overgrowth in the bladder. - Proanthocyanidin (PAC) supplement 36 mg daily; must be soluble (insoluble form of PAC will be ineffective). Recommended brand: Ellura. This is an  over-the-counter supplement (often must be found/ purchased online) supplement derived from cranberries with concentrated active component: Proanthocyanidin (PAC) 36 mg daily. Decreases bacterial adherence to bladder lining.  - D-mannose powder (2 grams daily). This is an over-the-counter supplement which decreases bacterial adherence to bladder lining (it is a sugar that inhibits bacterial adherence to urothelial cells by binding to the pili of enteric bacteria). Take as per manufacturer recommendation. Can be used as an alternative or in addition to the concentrated cranberry supplement.  - Vitamin C supplement to acidify urine to minimize bacterial growth.  - Probiotic to maintain healthy vaginal microbiome to suppress bacteria at urethral opening. Brand recommendations: Verlon (includes probiotic & D-mannose ), Feminine Balance (highest concentration of lactobacillus) or Hyperbiotic Pro 15.  Note for patients with diabetes:  - Be aware that D-mannose contains sugar.  Note for patients with interstitial cystitis (IC):  - Patients with IC should typically avoid cranberry/ PAC supplements and Vitamin C supplements due to their acidity, which may exacerbate IC-related bladder pain. - Symptoms of true bacterial UTI can overlap / mimic symptoms of an IC flare up. Antibiotic use is NOT indicated for IC flare ups. Urine culture needed prior to antibiotic treatment for IC patients. The goal is to minimize your risk for developing antibiotic-resistant bacteria.    Vaginal atrophy I Genitourinary syndrome of menopause (GSM):  What it is: Changes in the vaginal environment (including the vulva and urethra) including: Thinning of the epithelium (skin/ mucosa surface) Can contribute to urinary urgency and frequency Can contribute to dryness, itching, irritation of the vulvar and vaginal tissue Can contribute to pain with intercourse Can contribute to physical changes of the labia, vulva, and vagina such  as: Narrowing of the vaginal opening Decreased vaginal length Loss of labial architecture Labial adhesions Pale color of vulvovaginal tissue Loss of pubic hair Allows bacteria to become adherent Results in increased risk for urinary tract infection (UTI) due to bacterial overgrowth and migration up the urethra into the bladder Change in vaginal pH (acid/ base balance) Allows for alteration / disruption of the normal bacterial  flora / microbiome Results in increased risk for urinary tract infection (UTI) due to bacterial overgrowth  Treatment options: Over-the-counter lubricants (see list below). Prescription vaginal estrogen replacement. Options: Topical vaginal estrogen (estradiol ) cream: (Estrace , Premarin, compounded) Apply as directed Estring  vaginal ring Exchanged every 3 months (either at home or in office by provider) Vagifem  vaginal tablet Inserted nightly for 2 weeks then twice a week (long term) lntrarosa vaginal suppository Vaginal DHEA: converts to estrogen in vaginal tissue without systemic effect Inserted nightly (long term) 3. Vaginal laser therapy (Mona Olam touch) Performed in 3 treatments each 6 weeks apart (available in our [place] office). Can feel like a sunburn for 3-4 days after each treatment until new skin heals in. Usually not covered by insurance. Estimated cost is $1500 for all 3 sessions.  FYI regarding prescription vaginal estrogen treatment options: - All topical vaginal estrogen replacement options are equivalent in terms of efficacy. - Topical vaginal estrogen replacement will take about 3 months to be effective. - OK to have sex with any of the topical vaginal estrogen replacement options. - Topical vaginal estrogen replacement may sting/burn initially due to severe dryness, which will improve with ongoing treatment. - Studies have demonstrated negligible systemic absorption of low-dose intravaginal estrogen cream therefore the risk for cancer  development or recurrence with this medication is minimal.  Genitourinary Syndrome of Menopause: AUA/SUFU/AUGS Guideline (2025) [place]  Topical vaginal estrogen cream safe to use with breast cancer history [email or web address]  Topical vaginal estrogen cream safe to use with blood clot history [email or web address]   Lubricants and Moisturizers for Treating Genitourinary Syndrome of Menopause and Vulvovaginal Atrophy Treatment Comments I Available Products   Lubricants   Water -based Ingredients: Deionized water , glycerin, propylene glycol; latex safe; rare irritation; dry out with extended sexual activity Astroglide, Good Clean Love, K-Y Jelly, Natural, Organic, Pink, Sliquid, Sylk, Yes    Oil Based Ingredients: avocado, olive, peanut, corn; latex safe; can be used with silicone products; staining; safe (unless peanut allergy); non-irritating Coconut oil, vegetable oil, vitamin E oil  Silicone-Based Ingredients: Silicone polymers; staining; typically nonirritating, long lasting; waterproof; should not be used with silicone dilators, sexual toys, or gynecologic products Astroglide X, Oceanus Ultra Pure, Pink Silicone, Pjur Eros, Replens Silky Smooth, Silicone Premium JO, SKYN, Uberlube, [employer] Based Minimize harm to sperm motility; designed for couples trying to conceive:  Astroglide TTC, Conceive Plus, PreSeed, Yes Baby  Fertility Friendly Minimize harm to sperm motility; designed for couples trying to conceive:  Astroglide, TTC, Conceive Plus, PreSeed, Yes Baby  Vaginal Moisturizers   Vaginal Moisturizers For maintenance use 1 to 3 times  weekly; can benefit women with dryness, chafing with AOL, and recurrent vaginal infections irrespective of sexual activity timing Balance Active Menopause Vaginal Moisturizing Lubricant, Canesintima Intimate Moisturizer, Replens, Rephresh, Sylk Natural Intimate Moisturizer, Yes Vaginal Moisturizer  Hybrids Properties of both water  and silicone-based products (combination of a vaginal lubricant and moisturizer); Non-irritating; good option for women with allergies and sensitivities Lubrigyn, Luvena  Suppositories Hyaluronic acid to retain moisture Revaree  Vulvar Soothing Creams/Oils    Medicated Creams Pain and burn relief; Ingredients: 4% Lidocaine , Aloe Vera gel Releveum ([place])  Non-Medicated Creams For anti-itch and moisture/maintenance; Ingredients: Coconut oil, Avocado oil, Shea Butter, Olive oil, Vitamin E Vajuvenate, Vmagic  Oils for moisture/maintenance: Coconut oil, Vitamin E oil, Emu oil       Electronically signed by:  Lauraine KYM Oz, MSN, FNP-C, CUNP 12/08/2023 11:59 AM

## 2023-12-08 ENCOUNTER — Ambulatory Visit (INDEPENDENT_AMBULATORY_CARE_PROVIDER_SITE_OTHER): Admitting: Urology

## 2023-12-08 ENCOUNTER — Encounter: Payer: Self-pay | Admitting: Urology

## 2023-12-08 VITALS — BP 131/68 | HR 92

## 2023-12-08 DIAGNOSIS — Z789 Other specified health status: Secondary | ICD-10-CM

## 2023-12-08 DIAGNOSIS — J9611 Chronic respiratory failure with hypoxia: Secondary | ICD-10-CM | POA: Diagnosis not present

## 2023-12-08 DIAGNOSIS — F1590 Other stimulant use, unspecified, uncomplicated: Secondary | ICD-10-CM | POA: Diagnosis not present

## 2023-12-08 DIAGNOSIS — Z87440 Personal history of urinary (tract) infections: Secondary | ICD-10-CM | POA: Diagnosis not present

## 2023-12-08 DIAGNOSIS — N3281 Overactive bladder: Secondary | ICD-10-CM | POA: Diagnosis not present

## 2023-12-08 DIAGNOSIS — R3129 Other microscopic hematuria: Secondary | ICD-10-CM | POA: Diagnosis not present

## 2023-12-08 DIAGNOSIS — N952 Postmenopausal atrophic vaginitis: Secondary | ICD-10-CM | POA: Insufficient documentation

## 2023-12-08 DIAGNOSIS — Z79899 Other long term (current) drug therapy: Secondary | ICD-10-CM | POA: Diagnosis not present

## 2023-12-08 DIAGNOSIS — J449 Chronic obstructive pulmonary disease, unspecified: Secondary | ICD-10-CM | POA: Diagnosis not present

## 2023-12-08 DIAGNOSIS — Z87891 Personal history of nicotine dependence: Secondary | ICD-10-CM | POA: Insufficient documentation

## 2023-12-08 LAB — URINALYSIS, ROUTINE W REFLEX MICROSCOPIC
Bilirubin, UA: NEGATIVE
Glucose, UA: NEGATIVE
Ketones, UA: NEGATIVE
Leukocytes,UA: NEGATIVE
Nitrite, UA: NEGATIVE
Protein,UA: NEGATIVE
Specific Gravity, UA: <1.005 — ABNORMAL LOW (ref 1.005–1.030)
Urobilinogen, Ur: 0.2 mg/dL (ref 0.2–1.0)
pH, UA: 6 (ref 5.0–7.5)

## 2023-12-08 LAB — MICROSCOPIC EXAMINATION
Bacteria, UA: NONE SEEN
WBC, UA: NONE SEEN /HPF (ref 0–5)

## 2023-12-08 LAB — BLADDER SCAN AMB NON-IMAGING: Scan Result: 6

## 2023-12-08 MED ORDER — GEMTESA 75 MG PO TABS: 1.0000 | ORAL_TABLET | Freq: Every day | ORAL | 11 refills | Status: AC

## 2023-12-08 MED ORDER — ESTRADIOL 0.1 MG/GM VA CREA: TOPICAL_CREAM | VAGINAL | 3 refills | Status: AC

## 2023-12-08 NOTE — Patient Instructions (Signed)
 Overactive bladder (OAB) overview for patients:  Symptoms may include: urinary urgency (gotta go feeling) urinary frequency (voiding >8 times per day) night time urination (nocturia) urge incontinence of urine (UUI)  While we do not know the exact etiology of OAB, several treatment options exist including:  Behavioral therapy: Reducing fluid intake Decreasing bladder stimulants (such as caffeine) and irritants (such as acidic food, spicy foods, alcohol) Urge suppression strategies Bladder retraining via timed voiding  Pelvic floor physical therapy  Medication(s) - can use one or both of the drug classes below. Anticholinergic / antimuscarinic medications:  Mechanism of action: Activate M3 receptors to reduce detrusor stimulation and increase bladder capacity  (parasympathetic nervous system). Effect: Relaxes the bladder to decrease overactivity, increase bladder storage capacity, and increase time between voids. Onset: Slow acting (may take 8-12 weeks to determine efficacy). Medications include: Vesicare (Solifenacin), Ditropan (Oxybutynin), Detrol (Tolterodine), Toviaz (Fesoterodine), Sanctura (Trospium), Urispas (Flavoxate), Enablex (Darifenacin), Bentyl (Dicyclomine), Levsin (Hyoscyamine ). Potential side effects include but are not limited to: Dry eyes, dry mouth, constipation, cognitive impairment, dementia risk with long term use, and urinary retention/ incomplete bladder emptying. Insurance companies generally prefer for patients to try 1-2 anticholinergic / antimuscarinic medications first due to low cost. Some exceptions are made based on patient-specific comorbidities / risk factors. Beta-3 agonist medications: Mechanism of action: Stimulates selective B3 adrenergic receptors to cause smooth muscle bladder relaxation (sympathetic nervous system). Effect: Relaxes the bladder to decrease overactivity, increase bladder storage capacity, and increase time between  voids. Onset: Slow acting (may take 8-12 weeks to determine efficacy). Medications include: Myrbetriq (Mirabegron) and Vibegron Andris). Potential side effects include but are not limited to: urinary retention / incomplete bladder emptying and elevated blood pressure (more likely to occur in individuals with pre-existing uncontrolled hypertension). These medications tend to be more expensive than the anticholinergic / antimuscarinic medications.   For patients with refractory OAB (if the above treatment options have been unsuccessful): Posterior tibial nerve stimulation (PTNS). Small acupuncture-type needle inserted near ankle with electric current to stimulate bladder via posterior tibial nerve pathway. Initially requires 12 weekly in-office treatments lasting 30 minutes each; followed by monthly in-office treatments lasting 30 minutes each for 1 year.  Bladder Botox injections. How it is done: Typically done via in-office cystoscopy; sometimes done in the OR depending on the situation. The bladder is numbed with lidocaine  instilled via a catheter. Then the urologist injects Botox into the bladder muscle wall in about 20 locations. Causes local paralysis of the bladder muscle at the injection sites to reduce bladder muscle overactivity / spasms. The effect lasts for approximately 6 months and cannot be reversed once performed. Risks may included but are not limited to: infection, incomplete bladder emptying/ urinary retention, short term need for self-catheterization or indwelling catheter, and need for repeat therapy. There is a 5-12% chance of needing to catheterize with Botox - that usually resolves in a few months as the Botox wears off. Typically Botox injections would need to be repeated every 3-12 months since this is not a permanent therapy.  Sacral neuromodulation trial (Medtronic lnterStim or Axonics implant). Sacral neuromodulation is FDA-approved for uncontrolled urinary urgency,  urinary frequency, urinary urge incontinence, non-obstructive urinary retention, or fecal incontinence. It is not FDA-approved as a treatment for pain. The goal of this therapy is at least a 50% improvement in symptoms. It is NOT realistic to expect a 100% cure. This is a a 2-step outpatient procedure. After a successful test period,  a permanent wire and generator are placed in the OR. We discussed the risk of infection. We reviewed the fact that about 30% of patients fail the test phase and are not candidates for permanent generator placement. During the 1-2 week trial phase, symptoms are documented by the patient to determine response. If patient gets at least a 50% improvement in symptoms, they may then proceed with Step 2. Step 1: Trial lead placement. Per physician discretion, may done one of two ways: Percutaneous nerve evaluation (PNE) in the [facility] urology office. Performed by urologist under local anesthesia (numbing the area with lidocaine ) using a spinal needle for placement of test wire, which usually stays in place for 5-7 days to determine therapy response. Test lead placement in OR under anesthesia. Usually stays in place 2 weeks to determine therapy response. > Step 2: Permanent implantation of sacral neuromodulation device, which is performed in the OR.  Sacral neuromodulation implants: All are conditionally MRI safe. Manufacturer: Medtronic Website: BuffaloDryCleaner.gl therapy/right-for-you.html Options: lnterStim X: Non-rechargeable. The battery lasts 10 years on average. lnterStim Micro: Rechargeable. The battery lasts 15 years on average and must be charged routinely. Approximately 50% smaller implant than lnterStim X implant.  Manufacturer: Axonics Website: Findrealrelief.axonics.com Options: Non-rechargeable (Axonics F15): The battery lasts 15 years on average. Rechargeable (Axonics R20): The  battery lasts 20 years on average and must be charged in office for about 1 hour every 6-10 months on average. Approximately 50% smaller implant than Axonics non-rechargeable implant.  Note: Generally the rechargeable devices are only advised for very small or thin patients who may not have sufficient adipose tissue to comfortably overlay the implanted device.  Suprapubic catheter (SP tube) placement. Only done in severely refractory OAB when all other options have failed or are not a viable treatment choice depending on patient factors. Involves placement of a catheter through the lower abdomen into the bladder to continuously drain the bladder into an external collection bag, which patient can then empty at their convenience every few hours. Done via an outpatient surgical procedure in the OR under anesthesia. Risks may included but are not limited to: surgical site pain, infections, skin irritation / breakdown, chronic bacteriuria, symptomatic UTls. The SP tube must stay in place continuously. This is a reversible procedure however - the insertion site will close if catheter is removed for more than a few hours. The SP tube must be exchanged routinely every 4 weeks to prevent the catheter from becoming clogged with sediment. SP tube exchanges are typically performed at a urology nurse visit or by a home health nurse.       UTI prevention / management:  UTI symptoms may include:  - Pain / burning / discomfort when urinating - Recent increase in urinary urgency (how quickly you feel like you need to rush to the bathroom) - Recent increase in urinary frequency (how often you are urinating) - Fever - Acute mental status change / confusion - Fatigue / Feeling tired - Weakness - Note: Urine color, clarity, and odor are not considered to be clinically significant indicators of UTI and do not warrant urine testing unless patient is also experiencing UTI symptoms such as those listed  above.  Difference between Urinalysis (urine dipstick test) and Urine culture / Why urine culture often needed to determine appropriate diagnosis and treatment of urologic symptoms: > Urinalysis (urine dipstick test): A quick office test used as an indicator to determine whether or not further testing is necessary (such as a urine culture, urine microscopy,  etc.) The urinalysis cannot differentiate a true bacterial UTI or give a definitive diagnosis for the findings.  > Urine culture: May be performed based on the findings of a urinalysis to evaluate for UTI. Grows out on a petri dish for 48-72 hours. Provides important information about: whether or not bacterial growth is present and if so: what the predominant bacteria is which antibiotics will work best against that bacteria That information is important so that we can diagnose and treat patients appropriately as there are other conditions which may mimic UTls which must not be missed (such as cancer, interstitial cystitis, stones, etc.). Assists us  with antibiotic stewardship to minimize patient's risk for developing antibiotic resistance (getting to a point where no antibiotics work anymore).  Options when UTI symptoms occur: 1. Call [facility] [place] and request to speak with triage nurse (phone # [phone], select option 3). In accordance with clinic guidelines the nurse will determine next steps based on patient-reported symptoms, which may include: same-day lab visit to provide urine specimen, recommendation to schedule Urology office visit appointment for further evaluation, recommendation to proceed to ER, etc. 2. Call your Primary Care Provider (PCP) office to request urgent / same-day visit. Be sure to request for urine culture to be ordered and have results faxed to Urology (fax # [phone]).  3. Go to urgent care. Be sure to request for urine culture to be ordered and have results faxed to Urology (fax #  [phone]).   For bladder pain/ burning with urination: - Can take over-the-counter Pyridium (phenazopyridine; commonly known under the AZO brand) for a few days as needed. Limit use to no more than 3 days consecutively due to risk for methemoglobinemia, liver function issues, and bone health damage with long term use of Pyridium. - Alternative: Prescription urinary analgesics (such as Uribel, Urogesic blue, Urelle, Uro-MP). Often expensive / poorly covered by insurance unfortunately.  Options / recommendations for UTI prevention: - Topical vaginal estrogen for vaginal atrophy (aka Genitourinary Syndrome of Menopause (GSM)). - Adequate daily fluid intake to flush out the urinary tract. - Go to the bathroom to urinate every 4-6 hours while awake to minimize urinary stasis / bacterial overgrowth in the bladder. - Proanthocyanidin (PAC) supplement 36 mg daily; must be soluble (insoluble form of PAC will be ineffective). Recommended brand: Ellura. This is an over-the-counter supplement (often must be found/ purchased online) supplement derived from cranberries with concentrated active component: Proanthocyanidin (PAC) 36 mg daily. Decreases bacterial adherence to bladder lining.  - D-mannose powder (2 grams daily). This is an over-the-counter supplement which decreases bacterial adherence to bladder lining (it is a sugar that inhibits bacterial adherence to urothelial cells by binding to the pili of enteric bacteria). Take as per manufacturer recommendation. Can be used as an alternative or in addition to the concentrated cranberry supplement.  - Vitamin C supplement to acidify urine to minimize bacterial growth.  - Probiotic to maintain healthy vaginal microbiome to suppress bacteria at urethral opening. Brand recommendations: Verlon (includes probiotic & D-mannose ), Feminine Balance (highest concentration of lactobacillus) or Hyperbiotic Pro 15.  Note for patients with diabetes:  - Be aware that  D-mannose contains sugar.  Note for patients with interstitial cystitis (IC):  - Patients with IC should typically avoid cranberry/ PAC supplements and Vitamin C supplements due to their acidity, which may exacerbate IC-related bladder pain. - Symptoms of true bacterial UTI can overlap / mimic symptoms of an IC flare up. Antibiotic use is NOT indicated for IC  flare ups. Urine culture needed prior to antibiotic treatment for IC patients. The goal is to minimize your risk for developing antibiotic-resistant bacteria.    Vaginal atrophy I Genitourinary syndrome of menopause (GSM):  What it is: Changes in the vaginal environment (including the vulva and urethra) including: Thinning of the epithelium (skin/ mucosa surface) Can contribute to urinary urgency and frequency Can contribute to dryness, itching, irritation of the vulvar and vaginal tissue Can contribute to pain with intercourse Can contribute to physical changes of the labia, vulva, and vagina such as: Narrowing of the vaginal opening Decreased vaginal length Loss of labial architecture Labial adhesions Pale color of vulvovaginal tissue Loss of pubic hair Allows bacteria to become adherent Results in increased risk for urinary tract infection (UTI) due to bacterial overgrowth and migration up the urethra into the bladder Change in vaginal pH (acid/ base balance) Allows for alteration / disruption of the normal bacterial flora / microbiome Results in increased risk for urinary tract infection (UTI) due to bacterial overgrowth  Treatment options: Over-the-counter lubricants (see list below). Prescription vaginal estrogen replacement. Options: Topical vaginal estrogen (estradiol ) cream: (Estrace , Premarin, compounded) Apply as directed Estring  vaginal ring Exchanged every 3 months (either at home or in office by provider) Vagifem  vaginal tablet Inserted nightly for 2 weeks then twice a week (long term) lntrarosa vaginal  suppository Vaginal DHEA: converts to estrogen in vaginal tissue without systemic effect Inserted nightly (long term) 3. Vaginal laser therapy (Mona Olam touch) Performed in 3 treatments each 6 weeks apart (available in our [place] office). Can feel like a sunburn for 3-4 days after each treatment until new skin heals in. Usually not covered by insurance. Estimated cost is $1500 for all 3 sessions.  FYI regarding prescription vaginal estrogen treatment options: - All topical vaginal estrogen replacement options are equivalent in terms of efficacy. - Topical vaginal estrogen replacement will take about 3 months to be effective. - OK to have sex with any of the topical vaginal estrogen replacement options. - Topical vaginal estrogen replacement may sting/burn initially due to severe dryness, which will improve with ongoing treatment. - Studies have demonstrated negligible systemic absorption of low-dose intravaginal estrogen cream therefore the risk for cancer development or recurrence with this medication is minimal.  Genitourinary Syndrome of Menopause: AUA/SUFU/AUGS Guideline (2025) [place]Info.at  Topical vaginal estrogen cream safe to use with breast cancer history WomenInsider.com.ee  Topical vaginal estrogen cream safe to use with blood clot history [email or web address]   Lubricants and Moisturizers for Treating Genitourinary Syndrome of Menopause and Vulvovaginal Atrophy Treatment Comments I Available Products   Lubricants   Water-based Ingredients: Deionized water, glycerin, propylene glycol; latex safe; rare irritation; dry out with extended sexual activity  Astroglide, Good Clean Love, K-Y Jelly, Natural, Organic, Pink, Sliquid, Sylk, Yes    Oil Based Ingredients: avocado, olive, peanut, corn; latex safe; can be used with silicone products; staining; safe (unless peanut allergy); non-irritating Coconut oil, vegetable oil, vitamin E oil  Silicone-Based Ingredients: Silicone polymers; staining; typically nonirritating, long lasting; waterproof; should not be used with silicone dilators, sexual toys, or gynecologic products Astroglide X, Oceanus Ultra Pure, Pink Silicone, Pjur Eros, Replens Silky Smooth, Silicone Premium JO, SKYN, Uberlube, [employer] Based Minimize harm to sperm motility; designed for couples trying to conceive:  Astroglide TTC, Conceive Plus, PreSeed, Yes Baby  Fertility Friendly Minimize harm to sperm motility; designed for couples trying to conceive:  Astroglide, TTC, Conceive Plus, PreSeed, Yes Baby  Vaginal Moisturizers   Vaginal Moisturizers  For maintenance use 1 to 3 times weekly; can benefit women with dryness, chafing with AOL, and recurrent vaginal infections irrespective of sexual activity timing Balance Active Menopause Vaginal Moisturizing Lubricant, Canesintima Intimate Moisturizer, Replens, Rephresh, Sylk Natural Intimate Moisturizer, Yes Vaginal Moisturizer  Hybrids Properties of both water and silicone-based products (combination of a vaginal lubricant and moisturizer); Non-irritating; good option for women with allergies and sensitivities Lubrigyn, Luvena  Suppositories Hyaluronic acid to retain moisture Revaree  Vulvar Soothing Creams/Oils    Medicated Creams Pain and burn relief; Ingredients: 4% Lidocaine , Aloe Vera gel Releveum ([place])  Non-Medicated Creams For anti-itch and moisture/maintenance; Ingredients: Coconut oil, Avocado oil, Shea Butter, Olive oil, Vitamin E Vajuvenate, Vmagic  Oils for moisture/maintenance: Coconut oil, Vitamin E oil, Emu oil

## 2023-12-15 ENCOUNTER — Ambulatory Visit (HOSPITAL_COMMUNITY)
Admission: RE | Admit: 2023-12-15 | Discharge: 2023-12-15 | Disposition: A | Discharge status: Home / Self Care | Source: Ambulatory Visit | Attending: Otolaryngology | Admitting: Otolaryngology

## 2023-12-15 DIAGNOSIS — R1310 Dysphagia, unspecified: Secondary | ICD-10-CM | POA: Insufficient documentation

## 2023-12-15 DIAGNOSIS — K224 Dyskinesia of esophagus: Secondary | ICD-10-CM | POA: Diagnosis not present

## 2023-12-22 ENCOUNTER — Ambulatory Visit: Admitting: Cardiology

## 2023-12-22 DIAGNOSIS — M2560 Stiffness of unspecified joint, not elsewhere classified: Secondary | ICD-10-CM | POA: Diagnosis not present

## 2023-12-22 DIAGNOSIS — E559 Vitamin D deficiency, unspecified: Secondary | ICD-10-CM | POA: Diagnosis not present

## 2023-12-22 DIAGNOSIS — M79641 Pain in right hand: Secondary | ICD-10-CM | POA: Diagnosis not present

## 2023-12-22 DIAGNOSIS — Z6826 Body mass index (BMI) 26.0-26.9, adult: Secondary | ICD-10-CM | POA: Diagnosis not present

## 2023-12-22 DIAGNOSIS — M810 Age-related osteoporosis without current pathological fracture: Secondary | ICD-10-CM | POA: Diagnosis not present

## 2023-12-22 DIAGNOSIS — M79642 Pain in left hand: Secondary | ICD-10-CM | POA: Diagnosis not present

## 2023-12-22 DIAGNOSIS — E663 Overweight: Secondary | ICD-10-CM | POA: Diagnosis not present

## 2023-12-22 DIAGNOSIS — M0600 Rheumatoid arthritis without rheumatoid factor, unspecified site: Secondary | ICD-10-CM | POA: Diagnosis not present

## 2023-12-22 DIAGNOSIS — R5383 Other fatigue: Secondary | ICD-10-CM | POA: Diagnosis not present

## 2023-12-22 DIAGNOSIS — M2540 Effusion, unspecified joint: Secondary | ICD-10-CM | POA: Diagnosis not present

## 2023-12-24 ENCOUNTER — Telehealth: Payer: Self-pay

## 2023-12-24 NOTE — Telephone Encounter (Signed)
 Pt left vm state's she thinks she may have a UTI. Return call to PT without answer, vm left to return call to office.

## 2023-12-25 ENCOUNTER — Telehealth: Payer: Self-pay | Admitting: Urology

## 2023-12-25 DIAGNOSIS — J9611 Chronic respiratory failure with hypoxia: Secondary | ICD-10-CM | POA: Diagnosis not present

## 2023-12-25 DIAGNOSIS — E559 Vitamin D deficiency, unspecified: Secondary | ICD-10-CM | POA: Diagnosis not present

## 2023-12-25 DIAGNOSIS — J449 Chronic obstructive pulmonary disease, unspecified: Secondary | ICD-10-CM | POA: Diagnosis not present

## 2023-12-25 DIAGNOSIS — Z79899 Other long term (current) drug therapy: Secondary | ICD-10-CM | POA: Diagnosis not present

## 2023-12-25 DIAGNOSIS — M0600 Rheumatoid arthritis without rheumatoid factor, unspecified site: Secondary | ICD-10-CM | POA: Diagnosis not present

## 2023-12-25 DIAGNOSIS — N390 Urinary tract infection, site not specified: Secondary | ICD-10-CM | POA: Diagnosis not present

## 2023-12-25 DIAGNOSIS — M810 Age-related osteoporosis without current pathological fracture: Secondary | ICD-10-CM | POA: Diagnosis not present

## 2023-12-25 DIAGNOSIS — N3289 Other specified disorders of bladder: Secondary | ICD-10-CM | POA: Diagnosis not present

## 2023-12-25 NOTE — Telephone Encounter (Signed)
 Dysuria  Patient called with c/o dysuria x 1 week days.  Pain: sharp, burning, and aching  Severity:5/10  Associated Signs and Symptoms:  Fever: no Chills: no Hematuria: yes Urgency: yes Frequency: yes Hesitancy:no Incontinence: yes Nausea: no Vomiting: no  Message sent to clinic staff to return call to patient to advise of plan.

## 2023-12-25 NOTE — Telephone Encounter (Signed)
 Urologic History:  Any Recent Urologic Surgeries or Procedures:no Recurrent UTI's:no Cystitis: no  Prostatitis:no Kidney or Bladder Stones: no Plan: Walk-in Clinic: Patient made aware that no provider is in office today. Patient states that she has an appointment with her PCP today and will have them assess her symptoms.

## 2024-01-08 ENCOUNTER — Telehealth: Payer: Self-pay

## 2024-01-08 DIAGNOSIS — Z87440 Personal history of urinary (tract) infections: Secondary | ICD-10-CM

## 2024-01-08 NOTE — Telephone Encounter (Signed)
 Patient calling for results from imaging, UA and labs.  Appointment was cancelled due to provider not with office.  Please advise.  Call:  [phone]

## 2024-01-08 NOTE — Telephone Encounter (Signed)
 Called pt to let her know we did not have any ua or imaging orders to give results for pt stated she did not need results but to schedule a f/u appointment to speak with someone regarding her treatment options for pt due to her continued symptoms   Dysuria   Patient called with c/o dysuria x 4-5 months off and on  Pain: intermittent and burning when she has to pee Severity:7/10  Associated Signs and Symptoms:  Fever: no Chills: no Hematuria: no Urgency: yes Frequency: yes Hesitancy:no Incontinence: yes Nausea: no Vomiting: no  Urologic History:  Any Recent Urologic Surgeries or Procedures:no Recurrent UTI's:yes Cystitis: yes no recently   Prostatitis:no Kidney or Bladder Stones: no Plan: Walk-in Clinic: no Appointment w/Physician: [no Lab visit scheduled for urine drop off: Yes Advice given:  Do you take on daily medications for UTI suppression No

## 2024-01-08 NOTE — Telephone Encounter (Signed)
 Tried calling patient and line was disconnect during voice message. Tried calling back several times with no answer.

## 2024-01-12 ENCOUNTER — Encounter: Payer: Self-pay | Admitting: Cardiology

## 2024-01-12 ENCOUNTER — Other Ambulatory Visit

## 2024-01-12 ENCOUNTER — Ambulatory Visit: Attending: Cardiology | Admitting: Cardiology

## 2024-01-12 VITALS — BP 120/72 | HR 93 | Ht 61.0 in | Wt 141.2 lb

## 2024-01-12 DIAGNOSIS — E782 Mixed hyperlipidemia: Secondary | ICD-10-CM | POA: Diagnosis not present

## 2024-01-12 DIAGNOSIS — Z87440 Personal history of urinary (tract) infections: Secondary | ICD-10-CM

## 2024-01-12 DIAGNOSIS — I471 Supraventricular tachycardia, unspecified: Secondary | ICD-10-CM | POA: Insufficient documentation

## 2024-01-12 DIAGNOSIS — J449 Chronic obstructive pulmonary disease, unspecified: Secondary | ICD-10-CM | POA: Diagnosis not present

## 2024-01-12 LAB — URINALYSIS, ROUTINE W REFLEX MICROSCOPIC
Bilirubin, UA: NEGATIVE
Glucose, UA: NEGATIVE
Ketones, UA: NEGATIVE
Nitrite, UA: NEGATIVE
Protein,UA: NEGATIVE
Specific Gravity, UA: <1.005 — ABNORMAL LOW (ref 1.005–1.030)
Urobilinogen, Ur: 0.2 mg/dL (ref 0.2–1.0)
pH, UA: 6 (ref 5.0–7.5)

## 2024-01-12 LAB — MICROSCOPIC EXAMINATION: Bacteria, UA: NONE SEEN

## 2024-01-12 NOTE — Progress Notes (Signed)
    Cardiology Office Note  Date: 01/12/2024   ID: HEIDI MACLIN, DOB 1956-03-18, MRN [id number]  History of Present Illness: [name] is a 68 y.o. female last seen in October 2024.  She is here today with family member for a follow-up visit.  Reports no prolonged interval palpitations, no sudden dizziness or syncope.  She continues to follow with Pulmonary.  Ultimately decided not to pursue transplant workup at [facility].  Medications reviewed.  She remains on Dilacor XR  240 mg daily.  Also Lipitor 40 mg daily.  She has not had a recent lipid panel.  I reviewed her ECG today which shows sinus rhythm.  QT interval incorrectly calculated.  Physical Exam: VS:  BP 120/72 (BP Location: Right Arm, Cuff Size: Normal)   Pulse 93   Ht 5' 1 (1.549 m)   Wt 141 lb 3.2 oz (64 kg)   SpO2 99% Comment: 6 L via [place]  BMI 26.68 kg/m , BMI Body mass index is 26.68 kg/m.  Wt Readings from Last 3 Encounters:  01/12/24 141 lb 3.2 oz (64 kg)  04/01/23 133 lb (60.3 kg)  03/06/23 131 lb 3.2 oz (59.5 kg)    General: Patient appears comfortable at rest.  Wearing oxygen  via nasal cannula. HEENT: Conjunctiva and lids normal. Neck: Supple, no elevated JVP or carotid bruits. Lungs: Decreased breath sounds without active wheezing. Cardiac: Regular rate and rhythm, no S3 or significant systolic murmur.  ECG:  An ECG dated 02/20/2023 was personally reviewed today and demonstrated:  Sinus rhythm with lead motion artifact, R' in lead V1.  Labwork: 02/22/2023: B Natriuretic Peptide 31.0; Magnesium  1.8 02/24/2023: BUN 17; Creatinine, Ser 0.61; Hemoglobin 11.9; Platelets 319; Potassium 4.1; Sodium 136  August 2025: BUN 12, creatinine 0.87, potassium 4.6, hemoglobin 12.8, platelets 311  Other Studies Reviewed Today:  No interval cardiac testing for review today.  Assessment and Plan:  1.  PSVT.  Symptomatically stable with no obvious recurrences on Dilacor XR  240 mg daily.  Resting heart rate in the 90s  today, ECG reviewed.  Continue with observation.   2.  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  Also OSA on BiPAP.  Continue to follow with Pulmonary.  3.  Coronary and aortic atherosclerosis evident by CT imaging.  She is on Lipitor 40 mg daily.  Check FLP.  Disposition:  Follow up 1 year.  Signed, Jayson JUDITHANN Sierras, M.D., F.A.C.C. [facility] at [facility]

## 2024-01-12 NOTE — Patient Instructions (Signed)
 Medication Instructions:   Your physician recommends that you continue on your current medications as directed. Please refer to the Current Medication list given to you today.   Labwork: Lipids  Testing/Procedures: None today  Follow-Up: 1 year  Any Other Special Instructions Will Be Listed Below (If Applicable).  If you need a refill on your cardiac medications before your next appointment, please call your pharmacy.

## 2024-01-13 ENCOUNTER — Ambulatory Visit: Payer: Self-pay | Admitting: Cardiology

## 2024-01-13 LAB — LIPID PANEL
Chol/HDL Ratio: 2.6 ratio (ref 0.0–4.4)
Cholesterol, Total: 197 mg/dL (ref 100–199)
HDL: 75 mg/dL (ref >39)
LDL Chol Calc (NIH): 103 mg/dL — ABNORMAL HIGH (ref 0–99)
Triglycerides: 108 mg/dL (ref 0–149)
VLDL Cholesterol Cal: 19 mg/dL (ref 5–40)

## 2024-01-13 MED ORDER — ATORVASTATIN CALCIUM 80 MG PO TABS
80.0000 mg | ORAL_TABLET | Freq: Every day | ORAL | 3 refills | Status: AC
Start: 2024-01-13 — End: 2024-04-12

## 2024-01-13 NOTE — Telephone Encounter (Signed)
 The patient has been notified of the result and verbalized understanding.  All questions (if any) were answered. Bernett Dorothyann LABOR, RN 01/13/2024 10:01 AM    PCP copied

## 2024-01-13 NOTE — Telephone Encounter (Signed)
-----   Message from Jayson Sierras sent at 01/13/2024  9:40 AM EDT ----- Results reviewed.  LDL 103 and HDL 75.  If she is in agreement, would suggest increasing Lipitor to 80 mg daily in the setting of known atherosclerosis. ----- Message ----- From: Interface, [facility] Lab Results In Sent: 01/13/2024   5:38 AM EDT To: Jayson KANDICE Sierras, MD

## 2024-01-19 ENCOUNTER — Ambulatory Visit (INDEPENDENT_AMBULATORY_CARE_PROVIDER_SITE_OTHER): Admitting: Otolaryngology

## 2024-01-19 VITALS — BP 131/65 | HR 87

## 2024-01-19 DIAGNOSIS — R490 Dysphonia: Secondary | ICD-10-CM

## 2024-01-19 DIAGNOSIS — Z9109 Other allergy status, other than to drugs and biological substances: Secondary | ICD-10-CM

## 2024-01-19 DIAGNOSIS — K224 Dyskinesia of esophagus: Secondary | ICD-10-CM

## 2024-01-19 DIAGNOSIS — J383 Other diseases of vocal cords: Secondary | ICD-10-CM | POA: Diagnosis not present

## 2024-01-19 NOTE — Progress Notes (Signed)
 ENT Progress Note:   Update 01/19/2024  Discussed the use of AI scribe software for clinical note transcription with the patient, who gave verbal consent to proceed.  History of Present Illness [name] is a 68 year old female who presents with chronic mucus production.   She has a history of esophageal dysmotility, confirmed by a recent swallow study showing dysmotility in the lower esophagus. No esophageal pouch was found on the recent swallow study.  She experiences chronic mucus production, which has been a lifelong issue since childhood. She uses Zyrtec , Singulair , carbinoxamine , and Sudafed daily with limited relief. Flonase  is used sporadically due to perceived ineffectiveness. She has also tried nasal saline rinses and environmental controls such as air cleaners and encasing mattresses.  She has a history of lung disease and is on oxygen  therapy, which may contribute to her symptoms. She inquires about the potential role of allergies and has previously seen an allergist.  She mentions a deviated septum and notes a 'crunchy sound'.  Records Reviewed:  Initial Evaluation  Reason for Consult: hoarseness   HPI: Discussed the use of AI scribe software for clinical note transcription with the patient, who gave verbal consent to proceed.  History of Present Illness [name] is a 68 year old female with hx of COPD on 24/7 supplemental O2, who is here for hoarseness.  She experiences a persistent, variable-intensity sound, described as a 'horrible sound' similar to belching without sensation of belching.   She denies current smoking and is on continuous oxygen  therapy, prescribed for 4 to 7 liters, currently using 4 or 5 liters. She has a history of feeling like something was catching in her throat when swallowing, which has improved, but she occasionally feels like the anomaly obstructs her airway.  She reports postnasal drainage and is taking Zyrtec  and Singulair  for  allergies. She recently started a new medication, though the name is unclear.  She feels like there is a pouch in her throat where medicine gets stuck, described as a sensation of a pouch in her esophagus.  No current issues with swallowing, but past episodes of feeling something catching in her throat. Occasional coughing.  Records Reviewed:  Madelin Stank NP Pulm  istory of Present Illness: 68 year old female former smoker followed for COPD with emphysema and chronic respiratory failure on home oxygen , sleep apnea-on nocturnal BiPAP, REM sleep disorder   Today's video visit is a 27-month follow-up.  Patient has an extensive medical history with underlying severe COPD with emphysema she is managed on Trelegy daily.  Last visit she was recommended to begin Ohtuvayre  and stop Daliresp .  She was referred to [facility] transplant team but has not heard from the team. Does not feel Ohtuvayre  has helped and seemed to irritate her throat.  Has started back on Daliresp .  Complains of drippy nose most days , no fever or discolored mucus  Has issue with recurrent hoarseness that comes and goes for long time. Feels throat gets sore and has a lot of upper airway noise. Previous seen by ENT, Dr Karis.    Patient has underlying sleep apnea.  Is on BiPAP at bedtime with 4 L of oxygen .  BiPAP download shows excellent compliance with 100% usage.  Daily average usage at 13 hours.  She is on BiPAP IPAP 14, EPAP 6 cm H2O.  AHI 0.9.   She remains on oxygen  24/7.  She is on 4 L of oxygen . Will increase 7 l/m with prolonged walking.  No increased oxygen  demands.     Past Medical History:  Diagnosis Date   Allergy    Anemia    Anxiety    Arthritis    COPD (chronic obstructive pulmonary disease) (HCC)    [facility] oxygen  at [facility]   COPD exacerbation (HCC)    Depression    Diverticulosis    Dyspnea    Dysrhythmia    GERD (gastroesophageal reflux disease)    History of cardiac catheterization    No  significant CAD 2005   Hyperlipidemia    Hypoglycemia    IBS (irritable bowel syndrome)    Neuromuscular disorder (HCC)    Osteoporosis    Paroxysmal SVT (supraventricular tachycardia) (HCC)    Pneumonia 2015   Pre-diabetes    Shingles 2012   Sleep apnea     Past Surgical History:  Procedure Laterality Date   BACK SURGERY     BIOPSY  09/12/2021   Procedure: BIOPSY;  Surgeon: Golda Claudis PENNER, MD;  [facility];  Service: Endoscopy;;   BREAST SURGERY     CARDIAC CATHETERIZATION  2004   COLONOSCOPY  03/22/2011   Procedure: COLONOSCOPY;  Surgeon: Claudis PENNER Golda, MD;  [facility];  Service: Endoscopy;  Laterality: N/A;   COLONOSCOPY WITH PROPOFOL  N/A 05/16/2017   Procedure: COLONOSCOPY WITH PROPOFOL ;  Surgeon: Golda Claudis PENNER, MD;  [facility];  Service: Endoscopy;  Laterality: N/A;   COLONOSCOPY WITH PROPOFOL  N/A 09/12/2021   Procedure: COLONOSCOPY WITH PROPOFOL ;  Surgeon: Golda Claudis PENNER, MD;  [facility];  Service: Endoscopy;  Laterality: N/A;  1230 ASA 2   ESOPHAGEAL DILATION N/A 04/19/2020   Procedure: ESOPHAGEAL DILATION;  Surgeon: Golda Claudis PENNER, MD;  [facility];  Service: Endoscopy;  Laterality: N/A;   ESOPHAGOGASTRODUODENOSCOPY (EGD) WITH PROPOFOL  N/A 04/19/2020   Procedure: ESOPHAGOGASTRODUODENOSCOPY (EGD) WITH PROPOFOL ;  Surgeon: Golda Claudis PENNER, MD;  [facility];  Service: Endoscopy;  Laterality: N/A;  9:15   ESOPHAGOGASTRODUODENOSCOPY (EGD) WITH PROPOFOL  N/A 09/12/2021   Procedure: ESOPHAGOGASTRODUODENOSCOPY (EGD) WITH PROPOFOL ;  Surgeon: Golda Claudis PENNER, MD;  [facility];  Service: Endoscopy;  Laterality: N/A;   FLEXIBLE SIGMOIDOSCOPY N/A 09/26/2017   Procedure: FLEXIBLE SIGMOIDOSCOPY with Propofol  ;  Surgeon: Golda Claudis PENNER, MD;  [facility];  Service: Endoscopy;  Laterality: N/A;  9:30   FLEXIBLE SIGMOIDOSCOPY N/A 06/04/2022   Procedure: FLEXIBLE SIGMOIDOSCOPY;   Surgeon: Eartha Angelia Sieving, MD;  [facility];  Service: Gastroenterology;  Laterality: N/A;  10:30am, asa 3   OSTOMY N/A 02/07/2022   Procedure: POSSIBLE OSTOMY;  Surgeon: Sheldon Standing, MD;  Location: [facility];  Service: General;  Laterality: N/A;   POLYPECTOMY  05/16/2017   Procedure: POLYPECTOMY;  Surgeon: Golda Claudis PENNER, MD;  [facility];  Service: Endoscopy;;   POLYPECTOMY  09/12/2021   Procedure: POLYPECTOMY INTESTINAL;  Surgeon: Golda Claudis PENNER, MD;  [facility];  Service: Endoscopy;;   PROCTOSCOPY N/A 02/07/2022   Procedure: RIGID PROCTOSCOPY;  Surgeon: Sheldon Standing, MD;  Location: [facility];  Service: General;  Laterality: N/A;   SPINAL FUSION     TOTAL HIP ARTHROPLASTY Left 07/15/2022   Procedure: TOTAL HIP ARTHROPLASTY;  Surgeon: Edna Sieving LABOR, MD;  Location: [facility];  Service: Orthopedics;  Laterality: Left;   VULVECTOMY      Family History  Problem Relation Age of Onset   Asthma Mother    Heart attack Mother  Diabetes Mother    Heart failure Mother    Colon cancer Neg Hx     Social History:  reports that she quit smoking about 8 years ago. Her smoking use included cigarettes. She started smoking about 48 years ago. She has a 40 pack-year smoking history. She has been exposed to tobacco smoke. She has never used smokeless tobacco. She reports current alcohol  use. She reports that she does not use drugs.  Allergies:  Allergies  Allergen Reactions   Ketek [Telithromycin] Other (See Comments)    Loopy, eyes went different directions   Penicillins Other (See Comments)    Unknown, childhood allergy   Adhesive [Tape] Itching and Rash    Break out   Chantix [Varenicline] Palpitations    Medications: I have reviewed the patient's current medications.  The PMH, PSH, Medications, Allergies, and SH were reviewed and updated.  ROS: Constitutional: Negative for fever, weight loss and weight gain. Cardiovascular: Negative for  chest pain and dyspnea on exertion. Respiratory: Is not experiencing shortness of breath at rest. Gastrointestinal: Negative for nausea and vomiting. Neurological: Negative for headaches. Psychiatric: The patient is not nervous/anxious  Blood pressure 131/65, pulse 87, SpO2 96%. There is no height or weight on file to calculate BMI.  PHYSICAL EXAM:  Exam: General: Well-developed, well-nourished Communication and Voice: Clear pitch and clarity Respiratory Respiratory effort: Equal inspiration and expiration without stridor Cardiovascular Peripheral Vascular: Warm extremities with equal color/perfusion Eyes: No nystagmus with equal extraocular motion bilaterally Neuro/Psych/Balance: Patient oriented to person, place, and time; Appropriate mood and affect; Gait is intact with no imbalance; Cranial nerves I-XII are intact Head and Face Inspection: Normocephalic and atraumatic without mass or lesion Palpation: Facial skeleton intact without bony stepoffs Salivary Glands: No mass or tenderness Facial Strength: Facial motility symmetric and full bilaterally ENT Pinna: External ear intact and fully developed External canal: Canal is patent with intact skin Tympanic Membrane: Clear and mobile External Nose: No scar or anatomic deformity Internal Nose: Septum is deviated to the left with narrowing of the left side. No polyp, or purulence. Mucosal edema and erythema present.  Bilateral inferior turbinate hypertrophy.  Lips, Teeth, and gums: Mucosa and teeth intact and viable TMJ: No pain to palpation with full mobility Oral cavity/oropharynx: No erythema or exudate, no lesions present - cleft palate  Neck Neck and Trachea: Midline trachea without mass or lesion Thyroid : No mass or nodularity Lymphatics: No lymphadenopathy   Studies Reviewed: CXR 02/20/23 Redemonstration of extensive subcentimeter calcified granulomas throughout bilateral lungs. Bilateral lungs appear hyperexpanded  and hyperlucent with coarse bronchovascular markings, in keeping with COPD. Bilateral lungs otherwise appear clear. No dense consolidation or lung collapse. Bilateral costophrenic angles are clear.   Normal cardio-mediastinal silhouette.   No acute osseous abnormalities.   The soft tissues are within normal limits.   IMPRESSION: 1. No radiographic evidence of acute cardiopulmonary disease. 2. COPD.  Esophagram 12/15/23 IMPRESSION: Single-contrast esophagram significant for moderate lower esophageal dysmotility.  Assessment/Plan: Encounter Diagnoses  Name Primary?   Environmental allergies    Esophageal dysmotility    Glottic insufficiency    Age-related vocal fold atrophy    Dysphonia Yes   Hoarseness      Assessment and Plan Assessment & Plan Chronic dysphonia Hx of productive cough and COPD, former smoker. Strobe exam without lesions but evidence of VF atrophy and glottic insufficiency. We discussed voice therapy but at this time she would like to hold off - monitor for now  Dysphagia to pills sensation of  a pouch in her throat She had mild pooling of secretions in left pyriform. Will obtain esophagram to rule out Zenker's.  - esophagram.  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  - observe for now   Chronic nasal congestion and post-nasal drainage Evidence of post-nasal drainage during flexible scope exam today, could be contributing to her sx - trial of Zyrtec  10 mg daily and Flonase  2 puffs b/l nares BID - consider nasal saline rinses   GERD LPR -  Reflux Gourmet after meals - diet and lifestyle changes to minimize GERD - Refer to [employer] blog for dietary and lifestyle modifications/reflux cook book  Update 01/19/24 Chronic mucus production with possible allergic and reflux contributing, could be related to hx of lung disease as well Chronic mucus production likely due to nasal drainage and reflux, with limited relief from current  medications. Possibly exacerbated by allergies and supplemental oxygen  therapy. - Provided handout on reducing mucus production. - Refer to allergist for allergy testing. - Consider switching antihistamine from Zyrtec  to Xyzal. - Continue Zyrtec , Singulair , carbinoxamine , Sudafed. - Encourage review of handout for additional strategies.  Vocal cord atrophy Vocal cord atrophy present, expected to progress slowly over the next ten or more years. No immediate intervention required. - she deferred speech therapy  Esophageal dysmotility Swallow study shows esophageal dysmotility in the lower esophagus, common in older patients. No esophageal pouch present. - monitor for now  Elena Larry, MD Otolaryngology [facility] Phone: [phone] Fax: [phone]    01/19/2024, 10:23 AM

## 2024-01-19 NOTE — Patient Instructions (Signed)
 MUCUS MANAGEMENT  Several factors can cause the sensation of increased mucus in the throat, including dryness, acid reflux, and increased mucus production from allergies or chronic sinus drainage.  There is also some evidence that added sugars or processed sugars in the diet (not the kind that occur naturally in honey or ripe fruit) can increase mucus, as well as too much dairy or refined carbohydrates. To avoid these refined carbohydrates, on food labels, watch out for "wheat flour" (also called "white," "refined" or "enriched" flour) on the ingredients list. The below website has several good options for mucus management.  leedsportal.com.pdf  Some recommendations: -Water , water , water  -Cut back on caffeine -Steam treatments during the day (Personal Steam Inhalers are available at [employer] and drugstores. [employer] sells one called Vick's steam inhaler that people like. A cheaper alternative is a bowl of warm water  with a towel over your head. You can breathe in the steam for a couple of minutes, especially if you are about to use your voice a lot, or when you are feeling particularly dry in your throat or mouth.) -Humidifier at night. Put this right next to your head so that the mist falls on your face. Do be aware that indoor dampness can promote bacteria, mold, and dust mite growth, so if you have a dust mite allergy you should be sure to use mattress and pillow covers and avoid excessive humidification. -Prevention of acid reflux by avoiding late-night eating (nothing 3 hours before laying down at night), greasy and spicy foods, and acidic foods -Mucinex  (over-the-counter, generic name guaifenesin . Buy the kind without a cough suppressant or decongestant added). This medicine doesn't work well if you are not well hydrated, so drink plenty of water  on days when you take it. -Xylimelts -Nasal saline spray such as Simply Saline (or any nasal saline without  preservatives, in 0.9% concentration) -Nasal saline irrigations with NeilMed rinse kit, Neti Pot, Active Sinus, or Nasopure irrigation bottles (available in any pharmacy or grocery store, and all available on [place]) -Avoid the things you are allergic to if you have allergies (use dust mite covers on your bed, wash your hair at night instead of morning if you have pollen allergies) - see this website for more information on managing allergies if this is a problem for you: www.sinusallergy.http://hicks.info/ -Hall's Breezers (sugar-free), Grether's black currant pastilles, and Entertainer's Secret Throat Relief can all help dry mouth and thickened mucous. See website above for details. -If you smoke, stop. -Low-processed-sugar diet. Processed sugars may increase mucus and even generalized inflammation in the body. The FDA is changing the way they label foods soon so that it's easier to tell when sugars occur naturally in the food (which is not harmful to our health) vs when processed sugars or syrups have been added to the food (which may increase inflammation in the body).  In the meantime, you can research the plant-based diet, anti-inflammatory diet, mediterranean diet, or the paleo diet to get an idea of foods that you might want to avoid. -Dairy - some patients find that eating a lot of dairy products worsens their mucus. -Sleep apnea - if you have sleep apnea and don't treat it, consider starting up your CPAP again (and be sure to use the humidification). Snoring and struggling to keep your airway open all night long is traumatizing to the lining of your throat and can increase irritation.           Travel steam inhalers/humidifiers: www.amandaflynnvoice.com http://www.amazon.com/Air-O-Swiss-7146-Travel-Ultrasonic-Humidifier/dp/B001JL4LZ4? https://www.jenkins-webster.com/

## 2024-01-22 ENCOUNTER — Ambulatory Visit: Admitting: Urology

## 2024-02-02 ENCOUNTER — Encounter: Payer: Self-pay | Admitting: Urology

## 2024-02-02 ENCOUNTER — Ambulatory Visit: Admitting: Urology

## 2024-02-02 VITALS — BP 146/74 | HR 99

## 2024-02-02 DIAGNOSIS — N3281 Overactive bladder: Secondary | ICD-10-CM | POA: Diagnosis not present

## 2024-02-02 DIAGNOSIS — N3941 Urge incontinence: Secondary | ICD-10-CM

## 2024-02-02 DIAGNOSIS — R3129 Other microscopic hematuria: Secondary | ICD-10-CM

## 2024-02-02 DIAGNOSIS — Z87440 Personal history of urinary (tract) infections: Secondary | ICD-10-CM

## 2024-02-02 MED ORDER — SOLIFENACIN SUCCINATE 5 MG PO TABS: 5.0000 mg | ORAL_TABLET | Freq: Every day | ORAL | 2 refills | Status: DC

## 2024-02-02 NOTE — Progress Notes (Signed)
 02/02/2024 3:47 PM   [name] 07/04/1955 [id number]  Referring provider: Sheryle Carwin, MD [address] [place],  [place] [address]  No chief complaint on file.   HPI:  New patient for me-  1) frequency urgency-associated with recurrent UTI which presents with dysuria.  She wears 2 diapers per day and has urgency and incontinence.  Going on for several years.  Tried oxybutynin  5 mg. No prolapse symptoms. She has COPD and sometimes issue walking (so SOB).   January 2025 renal ultrasound was benign.  Today, seen for the above.  In August 2025, Sarah prescribed estradiol  vaginal cream and Gemtesa . She sees improvement but still with some urgency. She does have some dysuria with urge and finishing voiding. Seems like a spasm. No gross hematuria.   PMH: Past Medical History:  Diagnosis Date   Allergy    Anemia    Anxiety    Arthritis    COPD (chronic obstructive pulmonary disease) (HCC)    Home oxygen  at [facility]   COPD exacerbation (HCC)    Depression    Diverticulosis    Dyspnea    Dysrhythmia    GERD (gastroesophageal reflux disease)    History of cardiac catheterization    No significant CAD 2005   Hyperlipidemia    Hypoglycemia    IBS (irritable bowel syndrome)    Neuromuscular disorder (HCC)    Osteoporosis    Paroxysmal SVT (supraventricular tachycardia)    Pneumonia 2015   Pre-diabetes    Shingles 2012   Sleep apnea     Surgical History: Past Surgical History:  Procedure Laterality Date   BACK SURGERY     BIOPSY  09/12/2021   Procedure: BIOPSY;  Surgeon: Golda Claudis PENNER, MD;  [facility];  Service: Endoscopy;;   BREAST SURGERY     CARDIAC CATHETERIZATION  2004   COLONOSCOPY  03/22/2011   Procedure: COLONOSCOPY;  Surgeon: Claudis PENNER Golda, MD;  [facility];  Service: Endoscopy;  Laterality: N/A;   COLONOSCOPY WITH PROPOFOL  N/A 05/16/2017   Procedure: COLONOSCOPY WITH PROPOFOL ;  Surgeon: Golda Claudis PENNER, MD;  [facility];  Service: Endoscopy;  Laterality: N/A;   COLONOSCOPY WITH PROPOFOL  N/A 09/12/2021   Procedure: COLONOSCOPY WITH PROPOFOL ;  Surgeon: Golda Claudis PENNER, MD;  [facility];  Service: Endoscopy;  Laterality: N/A;  1230 ASA 2   ESOPHAGEAL DILATION N/A 04/19/2020   Procedure: ESOPHAGEAL DILATION;  Surgeon: Golda Claudis PENNER, MD;  [facility];  Service: Endoscopy;  Laterality: N/A;   ESOPHAGOGASTRODUODENOSCOPY (EGD) WITH PROPOFOL  N/A 04/19/2020   Procedure: ESOPHAGOGASTRODUODENOSCOPY (EGD) WITH PROPOFOL ;  Surgeon: Golda Claudis PENNER, MD;  [facility];  Service: Endoscopy;  Laterality: N/A;  9:15   ESOPHAGOGASTRODUODENOSCOPY (EGD) WITH PROPOFOL  N/A 09/12/2021   Procedure: ESOPHAGOGASTRODUODENOSCOPY (EGD) WITH PROPOFOL ;  Surgeon: Golda Claudis PENNER, MD;  [facility];  Service: Endoscopy;  Laterality: N/A;   FLEXIBLE SIGMOIDOSCOPY N/A 09/26/2017   Procedure: FLEXIBLE SIGMOIDOSCOPY with Propofol  ;  Surgeon: Golda Claudis PENNER, MD;  [facility];  Service: Endoscopy;  Laterality: N/A;  9:30   FLEXIBLE SIGMOIDOSCOPY N/A 06/04/2022   Procedure: FLEXIBLE SIGMOIDOSCOPY;  Surgeon: Eartha Angelia Sieving, MD;  [facility];  Service: Gastroenterology;  Laterality: N/A;  10:30am, asa 3   OSTOMY N/A 02/07/2022   Procedure: POSSIBLE OSTOMY;  Surgeon: Sheldon Standing, MD;  Location: [facility];  Service: General;  Laterality: N/A;   POLYPECTOMY  05/16/2017  Procedure: POLYPECTOMY;  Surgeon: Golda Claudis PENNER, MD;  [facility];  Service: Endoscopy;;   POLYPECTOMY  09/12/2021   Procedure: POLYPECTOMY INTESTINAL;  Surgeon: Golda Claudis PENNER, MD;  [facility];  Service: Endoscopy;;   PROCTOSCOPY N/A 02/07/2022   Procedure: RIGID PROCTOSCOPY;  Surgeon: Sheldon Standing, MD;  Location: [facility];  Service: General;  Laterality: N/A;   SPINAL FUSION     TOTAL HIP ARTHROPLASTY Left 07/15/2022   Procedure: TOTAL HIP ARTHROPLASTY;  Surgeon:  Edna Toribio LABOR, MD;  Location: [facility];  Service: Orthopedics;  Laterality: Left;   VULVECTOMY      Home Medications:  Allergies as of 02/02/2024       Reactions   Ketek [telithromycin] Other (See Comments)   Loopy, eyes went different directions   Penicillins Other (See Comments)   Unknown, childhood allergy   Adhesive [tape] Itching, Rash   Break out   Chantix [varenicline] Palpitations        Medication List        Accurate as of February 02, 2024  3:47 PM. If you have any questions, ask your nurse or doctor.          albuterol  108 (90 Base) MCG/ACT inhaler Commonly known as: VENTOLIN  HFA Inhale 1-2 puffs into the lungs every 6 (six) hours as needed for wheezing or shortness of breath.   atorvastatin  80 MG tablet Commonly known as: LIPITOR Take 1 tablet (80 mg total) by mouth daily.   azelastine  0.1 % nasal spray Commonly known as: ASTELIN  Place 1 spray into both nostrils daily as needed for rhinitis or allergies. Use in each nostril as directed   Carbinoxamine  Maleate 4 MG Tabs Take 1 tablet (4 mg total) by mouth in the morning and at bedtime.   carboxymethylcellulose 1 % ophthalmic solution Place 2 drops into both eyes 3 (three) times daily as needed (for dry eyes).   cetirizine  10 MG tablet Commonly known as: ZYRTEC  Take 1 tablet (10 mg total) by mouth daily.   diltiazem  240 MG 24 hr capsule Commonly known as: DILACOR XR  Take 1 capsule (240 mg total) by mouth daily.   escitalopram  20 MG tablet Commonly known as: LEXAPRO  Take 20 mg by mouth daily.   estradiol  0.1 MG/GM vaginal cream Commonly known as: ESTRACE  Discard plastic applicator. Insert a blueberry size amount (approximately 1 gram) of cream on fingertip inside vagina at bedtime every night for 1 week then every other night. For long term use.   fluticasone  50 MCG/ACT nasal spray Commonly known as: FLONASE  Place 2 sprays into both nostrils 2 (two) times daily.   Gemtesa  75 MG  Tabs Generic drug: Vibegron  Take 1 tablet (75 mg total) by mouth daily.   ibuprofen  200 MG tablet Commonly known as: ADVIL  Take 400 mg by mouth every 6 (six) hours as needed for moderate pain.   meloxicam 15 MG tablet Commonly known as: MOBIC Take 15 mg by mouth daily as needed.   montelukast  10 MG tablet Commonly known as: SINGULAIR  Take 1 tablet (10 mg total) by mouth at bedtime.   NON FORMULARY Pt uses a bipap with 4L of oxygen  when laying down   OXYGEN  Inhale 3.5-7 L into the lungs continuous.   roflumilast  500 MCG Tabs tablet Commonly known as: DALIRESP  Take 500 mcg by mouth daily.   Trelegy Ellipta  100-62.5-25 MCG/ACT Aepb Generic drug: Fluticasone -Umeclidin-Vilant Inhale 1 puff into the lungs daily.   valACYclovir  500 MG tablet Commonly known as: VALTREX  Take  500 mg by mouth daily.   Vitamin D (Ergocalciferol) 1.25 MG (50000 UNIT) Caps capsule Commonly known as: DRISDOL Take 50,000 Units by mouth once a week.        Allergies:  Allergies  Allergen Reactions   Ketek [Telithromycin] Other (See Comments)    Loopy, eyes went different directions   Penicillins Other (See Comments)    Unknown, childhood allergy   Adhesive [Tape] Itching and Rash    Break out   Chantix [Varenicline] Palpitations    Family History: Family History  Problem Relation Age of Onset   Asthma Mother    Heart attack Mother    Diabetes Mother    Heart failure Mother    Colon cancer Neg Hx     Social History:  reports that she quit smoking about 8 years ago. Her smoking use included cigarettes. She started smoking about 48 years ago. She has a 40 pack-year smoking history. She has been exposed to tobacco smoke. She has never used smokeless tobacco. She reports current alcohol  use. She reports that she does not use drugs.   Physical Exam: BP (!) 146/74   Pulse 99   Constitutional:  Alert and oriented, No acute distress. HEENT: [place] AT, moist mucus membranes.  Trachea midline,  no masses. Cardiovascular: No clubbing, cyanosis, or edema. Respiratory: Normal respiratory effort, no increased work of breathing. GI: Abdomen is soft, nontender, nondistended, no abdominal masses GU: No CVA tenderness Skin: No rashes, bruises or suspicious lesions. Neurologic: Grossly intact, no focal deficits, moving all 4 extremities. Psychiatric: Normal mood and affect.  Laboratory Data: Lab Results  Component Value Date   WBC 11.7 (H) 02/24/2023   HGB 11.9 (L) 02/24/2023   HCT 37.3 02/24/2023   MCV 98.9 02/24/2023   PLT 319 02/24/2023    Lab Results  Component Value Date   CREATININE 0.61 02/24/2023    No results found for: PSA  No results found for: TESTOSTERONE  Lab Results  Component Value Date   HGBA1C 5.9 (H) 07/02/2022    Urinalysis    Component Value Date/Time   COLORURINE STRAW (A) 02/20/2023 1050   APPEARANCEUR Clear 01/12/2024 1142   LABSPEC 1.004 (L) 02/20/2023 1050   PHURINE 6.0 02/20/2023 1050   GLUCOSEU Negative 01/12/2024 1142   HGBUR MODERATE (A) 02/20/2023 1050   BILIRUBINUR Negative 01/12/2024 1142   KETONESUR NEGATIVE 02/20/2023 1050   PROTEINUR Negative 01/12/2024 1142   PROTEINUR NEGATIVE 02/20/2023 1050   UROBILINOGEN 0.2 01/20/2015 1957   NITRITE Negative 01/12/2024 1142   NITRITE NEGATIVE 02/20/2023 1050   LEUKOCYTESUR Trace (A) 01/12/2024 1142   LEUKOCYTESUR SMALL (A) 02/20/2023 1050    Lab Results  Component Value Date   LABMICR See below: 01/12/2024   WBCUA 0-5 01/12/2024   LABEPIT 0-10 01/12/2024   BACTERIA None seen 01/12/2024    Pertinent Imaging:  Results for orders placed during the hospital encounter of 06/02/23  US  RENAL  Narrative : PROCEDURE: US  RENAL  HISTORY: Patient is a 68 y/o  F with recurrent UTI.  Bladder spasms.  COMPARISON: CT [facility] 08/17/2021.  TECHNIQUE: Two-dimensional grayscale and color Doppler ultrasound of the kidneys was performed.  FINDINGS: The urinary bladder demonstrates  normal anechoic echogenicity. The bilateral ureteral jets are not visualized.  The right kidney measures 9.3 x 5.9 x 4.5 cm. Renal cortical echotexture is normal. There is no hydronephrosis. There are no stones. There are no cysts.  The left kidney measures 10.3 x 5.9 x 5.0 cm. Renal cortical echotexture  is normal. There is no hydronephrosis. There are no stones. There are no cysts.  IMPRESSION: 1.  Unremarkable ultrasound of the kidneys.  No hydronephrosis.  Thank you for allowing us  to assist in the care of this patient.   Electronically Signed By: Lynwood Mains M.D. On: 06/02/2023 21:29   Assessment & Plan:    1. OAB (overactive bladder) (Primary) Discussed adding an anticholinergic in the nature risk and benefits of those medications.  Also PT, PTNS or Botox. Add solifenacin to Gemtesa .   2.  Chronic cystitis-discussed GSM guideline and recommendation for transvaginal estrogen. No bacteria on UA today.   3. MH - complete work up with exam and cystoscopy   - Urinalysis, Routine w reflex microscopic   No follow-ups on file.  Donnice Brooks, MD  [facility]  [address] [place], [place] [address] [phone]

## 2024-02-03 LAB — URINALYSIS, ROUTINE W REFLEX MICROSCOPIC
Bilirubin, UA: NEGATIVE
Glucose, UA: NEGATIVE
Ketones, UA: NEGATIVE
Leukocytes,UA: NEGATIVE
Nitrite, UA: NEGATIVE
Protein,UA: NEGATIVE
Specific Gravity, UA: <1.005 — ABNORMAL LOW (ref 1.005–1.030)
Urobilinogen, Ur: 0.2 mg/dL (ref 0.2–1.0)
pH, UA: 6.5 (ref 5.0–7.5)

## 2024-02-03 LAB — MICROSCOPIC EXAMINATION: Bacteria, UA: NONE SEEN

## 2024-02-18 ENCOUNTER — Encounter (INDEPENDENT_AMBULATORY_CARE_PROVIDER_SITE_OTHER): Payer: Self-pay | Admitting: Gastroenterology

## 2024-03-08 ENCOUNTER — Ambulatory Visit: Admitting: Urology

## 2024-03-08 ENCOUNTER — Encounter: Payer: Self-pay | Admitting: Urology

## 2024-03-08 VITALS — BP 127/75 | HR 87

## 2024-03-08 DIAGNOSIS — R3129 Other microscopic hematuria: Secondary | ICD-10-CM | POA: Diagnosis not present

## 2024-03-08 DIAGNOSIS — N3281 Overactive bladder: Secondary | ICD-10-CM

## 2024-03-08 DIAGNOSIS — R350 Frequency of micturition: Secondary | ICD-10-CM

## 2024-03-08 LAB — URINALYSIS, ROUTINE W REFLEX MICROSCOPIC
Bilirubin, UA: NEGATIVE
Glucose, UA: NEGATIVE
Ketones, UA: NEGATIVE
Nitrite, UA: NEGATIVE
Protein,UA: NEGATIVE
Specific Gravity, UA: <1.005 — ABNORMAL LOW (ref 1.005–1.030)
Urobilinogen, Ur: 0.2 mg/dL (ref 0.2–1.0)
pH, UA: 6 (ref 5.0–7.5)

## 2024-03-08 LAB — MICROSCOPIC EXAMINATION
Bacteria, UA: NONE SEEN
RBC, Urine: >30 /HPF — AB (ref 0–2)
WBC, UA: >30 /HPF — AB (ref 0–5)

## 2024-03-08 MED ORDER — CIPROFLOXACIN HCL 500 MG PO TABS
500.0000 mg | ORAL_TABLET | Freq: Once | ORAL | Status: AC
  Administered 2024-03-08: 500 mg via ORAL

## 2024-03-08 NOTE — Progress Notes (Unsigned)
  [place]  03/08/24  CC: No chief complaint on file.   HPI:  F/u -    1) frequency urgency-associated with recurrent UTI which presents with dysuria.  She wears 2 diapers per day and has urgency and incontinence.  Going on for several years.  Tried oxybutynin  5 mg. No prolapse symptoms. She has COPD and sometimes issue walking (so SOB). In August 2025, Sarah prescribed estradiol  vaginal cream and Gemtesa . She sees improvement but still with some urgency. She does have some dysuria with urge and finishing voiding. Seems like a spasm. No gross hematuria.    2) microscopic hematuria-3-10 RBC on multiple UA.January 2025 renal ultrasound was benign.   Today, seen for the above.  Here for cystoscopy.  She started solifenacin in September 2025 in addition to Gemtesa  and it has been a buyer, retail.  Symptoms much improved.  Even her chronic bronchitis has dried up.  Cystoscopy today - NOV 2025 -  reveals an area of mild erythema lateral to the right UO.  There was a lot of debris in the urine.  UA with greater than 30 reds, 30 whites but no bacteria seen.  She is on home O2.  There were no vitals taken for this visit. NED. A&Ox3.   No respiratory distress   Abd soft, NT, ND Normal external genitalia with patent urethral meatus Introitus appears normal.  No vaginal lesions.  Vulva appeared normal.  Without lesion.  Chaperone for exam and cystoscopy: Bernarda.  Cystoscopy Procedure Note  Patient identification was confirmed, informed consent was obtained, and patient was prepped using Betadine  solution.  Lidocaine  jelly was administered per urethral meatus.    Procedure: - Flexible cystoscope introduced, without any difficulty.   - Thorough search of the bladder revealed:    normal urethral meatus    normal urothelium without papillary lesion, but Slight area of erythema lateral to the right UO.    no stones    no ulcers     no tumors    no urethral polyps    no trabeculation        - Ureteral orifices were normal in position and appearance.  Post-Procedure: - Patient tolerated the procedure well  Assessment/ Plan:  Microscopic hematuria-will send urine for culture and cytology today.  Will consider nightly antibiotic.  She has a follow-up appointment in about 6 weeks and we will keep that for repeat cystoscopy.  She is on home O2 so going to the OR for bladder biopsy not ideal.   No follow-ups on file.  Donnice Brooks, MD

## 2024-03-09 ENCOUNTER — Encounter: Payer: Self-pay | Admitting: Urology

## 2024-03-09 ENCOUNTER — Ambulatory Visit: Payer: Self-pay

## 2024-03-09 LAB — CYTOLOGY, URINE

## 2024-03-09 MED ORDER — SOLIFENACIN SUCCINATE 5 MG PO TABS: 5.0000 mg | ORAL_TABLET | Freq: Every day | ORAL | 3 refills | Status: DC

## 2024-03-09 NOTE — Addendum Note (Signed)
 Addended by: NIEVES COUGH R on: 03/09/2024 03:54 PM   Modules accepted: Orders

## 2024-03-30 ENCOUNTER — Telehealth: Payer: Self-pay

## 2024-03-30 DIAGNOSIS — N3281 Overactive bladder: Secondary | ICD-10-CM

## 2024-03-30 DIAGNOSIS — R350 Frequency of micturition: Secondary | ICD-10-CM

## 2024-03-30 MED ORDER — SOLIFENACIN SUCCINATE 5 MG PO TABS: 5.0000 mg | ORAL_TABLET | Freq: Every day | ORAL | 3 refills | Status: AC

## 2024-03-30 NOTE — Telephone Encounter (Signed)
 My chart message sent

## 2024-04-05 ENCOUNTER — Ambulatory Visit: Admitting: Urology

## 2024-04-05 VITALS — BP 113/64 | HR 96

## 2024-04-05 DIAGNOSIS — R3129 Other microscopic hematuria: Secondary | ICD-10-CM

## 2024-04-05 LAB — URINALYSIS, ROUTINE W REFLEX MICROSCOPIC
Bilirubin, UA: NEGATIVE
Glucose, UA: NEGATIVE
Ketones, UA: NEGATIVE
Leukocytes,UA: NEGATIVE
Nitrite, UA: NEGATIVE
Protein,UA: NEGATIVE
Specific Gravity, UA: <1.005 — ABNORMAL LOW (ref 1.005–1.030)
Urobilinogen, Ur: 0.2 mg/dL (ref 0.2–1.0)
pH, UA: 6 (ref 5.0–7.5)

## 2024-04-05 LAB — MICROSCOPIC EXAMINATION
Bacteria, UA: NONE SEEN
WBC, UA: NONE SEEN /HPF (ref 0–5)

## 2024-04-05 MED ORDER — CIPROFLOXACIN HCL 500 MG PO TABS
500.0000 mg | ORAL_TABLET | Freq: Once | ORAL | Status: AC
  Administered 2024-04-05: 500 mg via ORAL

## 2024-04-05 NOTE — Progress Notes (Unsigned)
 04/05/2024 9:58 AM   [name] 1955-08-11 [id number]  Referring provider: Sheryle Carwin, MD [address] [place],  [place] [address]  No chief complaint on file.   HPI:  F/u -    1) frequency urgency-associated with recurrent UTI which presents with dysuria.  She wears 2 diapers per day and has urgency and incontinence.  Going on for several years.  Tried oxybutynin  5 mg. No prolapse symptoms. She has COPD and sometimes issue walking (so SOB). In August 2025, Sarah prescribed estradiol  vaginal cream and Gemtesa . She sees improvement but still with some urgency. She does have some dysuria with urge and finishing voiding. Seems like a spasm. She started solifenacin  in September 2025 in addition to Gemtesa  and it has been a buyer, retail.  Symptoms much improved.  Even her chronic bronchitis has dried up.     2) microscopic hematuria-3-10 RBC on multiple UA. No gross hematuria. Cr nl in 2024. January 2025 renal ultrasound was benign. Nov 2025 cystoscopy with some scattered areas of erythema lateral to right UO and debris laden urine. UA with greater than 30 reds, 30 whites but no bacteria seen.   Today, seen for the above.  Here for possible repeat cystoscopy. Urine cx ordered last visit but not sent. Plan was to start cx appropriate nightly abx and repeat cysto.  Fortunately she is doing much better.  No dysuria or gross hematuria.  UA much clearer today.  No bacteria was seen.  No white cells, RBC 3-10.  Repeat cystoscopy benign today - DEC 2025.  She has noticed some mild blurry vision after starting solifenacin .  She reports regular eye exams and no history of glaucoma. No halos or eye pain.  Nov 2025 urine cytology NEGATIVE FOR HIGH-GRADE UROTHELIAL CARCINOMA (NHGUC). BENIGN UROTHELIAL CELLS ARE PRESENT. ACUTE AND CHRONIC INFLAMMATION PRESENT.   She is on home O2.  PMH: Past Medical History:  Diagnosis Date   Allergy    Anemia    Anxiety    Arthritis    COPD  (chronic obstructive pulmonary disease) (HCC)    Home oxygen  at [facility]   COPD exacerbation (HCC)    Depression    Diverticulosis    Dyspnea    Dysrhythmia    GERD (gastroesophageal reflux disease)    History of cardiac catheterization    No significant CAD 2005   Hyperlipidemia    Hypoglycemia    IBS (irritable bowel syndrome)    Neuromuscular disorder (HCC)    Osteoporosis    Paroxysmal SVT (supraventricular tachycardia)    Pneumonia 2015   Pre-diabetes    Shingles 2012   Sleep apnea     Surgical History: Past Surgical History:  Procedure Laterality Date   BACK SURGERY     BIOPSY  09/12/2021   Procedure: BIOPSY;  Surgeon: Golda Claudis PENNER, MD;  [facility];  Service: Endoscopy;;   BREAST SURGERY     CARDIAC CATHETERIZATION  2004   COLONOSCOPY  03/22/2011   Procedure: COLONOSCOPY;  Surgeon: Claudis PENNER Golda, MD;  [facility];  Service: Endoscopy;  Laterality: N/A;   COLONOSCOPY WITH PROPOFOL  N/A 05/16/2017   Procedure: COLONOSCOPY WITH PROPOFOL ;  Surgeon: Golda Claudis PENNER, MD;  [facility];  Service: Endoscopy;  Laterality: N/A;   COLONOSCOPY WITH PROPOFOL  N/A 09/12/2021   Procedure: COLONOSCOPY WITH PROPOFOL ;  Surgeon: Golda Claudis PENNER, MD;  [facility];  Service: Endoscopy;  Laterality: N/A;  1230 ASA 2  ESOPHAGEAL DILATION N/A 04/19/2020   Procedure: ESOPHAGEAL DILATION;  Surgeon: Golda Claudis PENNER, MD;  [facility];  Service: Endoscopy;  Laterality: N/A;   ESOPHAGOGASTRODUODENOSCOPY (EGD) WITH PROPOFOL  N/A 04/19/2020   Procedure: ESOPHAGOGASTRODUODENOSCOPY (EGD) WITH PROPOFOL ;  Surgeon: Golda Claudis PENNER, MD;  [facility];  Service: Endoscopy;  Laterality: N/A;  9:15   ESOPHAGOGASTRODUODENOSCOPY (EGD) WITH PROPOFOL  N/A 09/12/2021   Procedure: ESOPHAGOGASTRODUODENOSCOPY (EGD) WITH PROPOFOL ;  Surgeon: Golda Claudis PENNER, MD;  [facility];  Service: Endoscopy;  Laterality: N/A;   FLEXIBLE  SIGMOIDOSCOPY N/A 09/26/2017   Procedure: FLEXIBLE SIGMOIDOSCOPY with Propofol  ;  Surgeon: Golda Claudis PENNER, MD;  [facility];  Service: Endoscopy;  Laterality: N/A;  9:30   FLEXIBLE SIGMOIDOSCOPY N/A 06/04/2022   Procedure: FLEXIBLE SIGMOIDOSCOPY;  Surgeon: Eartha Angelia Sieving, MD;  [facility];  Service: Gastroenterology;  Laterality: N/A;  10:30am, asa 3   OSTOMY N/A 02/07/2022   Procedure: POSSIBLE OSTOMY;  Surgeon: Sheldon Standing, MD;  Location: [facility];  Service: General;  Laterality: N/A;   POLYPECTOMY  05/16/2017   Procedure: POLYPECTOMY;  Surgeon: Golda Claudis PENNER, MD;  [facility];  Service: Endoscopy;;   POLYPECTOMY  09/12/2021   Procedure: POLYPECTOMY INTESTINAL;  Surgeon: Golda Claudis PENNER, MD;  [facility];  Service: Endoscopy;;   PROCTOSCOPY N/A 02/07/2022   Procedure: RIGID PROCTOSCOPY;  Surgeon: Sheldon Standing, MD;  Location: [facility];  Service: General;  Laterality: N/A;   SPINAL FUSION     TOTAL HIP ARTHROPLASTY Left 07/15/2022   Procedure: TOTAL HIP ARTHROPLASTY;  Surgeon: Edna Sieving LABOR, MD;  Location: [facility];  Service: Orthopedics;  Laterality: Left;   VULVECTOMY      Home Medications:  Allergies as of 04/05/2024       Reactions   Ketek [telithromycin] Other (See Comments)   Loopy, eyes went different directions   Penicillins Other (See Comments)   Unknown, childhood allergy   Adhesive [tape] Itching, Rash   Break out   Chantix [varenicline] Palpitations        Medication List        Accurate as of April 05, 2024  9:58 AM. If you have any questions, ask your nurse or doctor.          albuterol  108 (90 Base) MCG/ACT inhaler Commonly known as: VENTOLIN  HFA Inhale 1-2 puffs into the lungs every 6 (six) hours as needed for wheezing or shortness of breath.   atorvastatin  80 MG tablet Commonly known as: LIPITOR Take 1 tablet (80 mg total) by mouth daily.   azelastine  0.1 % nasal spray Commonly known  as: ASTELIN  Place 1 spray into both nostrils daily as needed for rhinitis or allergies. Use in each nostril as directed   Carbinoxamine  Maleate 4 MG Tabs Take 1 tablet (4 mg total) by mouth in the morning and at bedtime.   carboxymethylcellulose 1 % ophthalmic solution Place 2 drops into both eyes 3 (three) times daily as needed (for dry eyes).   cetirizine  10 MG tablet Commonly known as: ZYRTEC  Take 1 tablet (10 mg total) by mouth daily.   diltiazem  240 MG 24 hr capsule Commonly known as: DILACOR XR  Take 1 capsule (240 mg total) by mouth daily.   escitalopram  20 MG tablet Commonly known as: LEXAPRO  Take 20 mg by mouth daily.   estradiol  0.1 MG/GM vaginal cream Commonly known as: ESTRACE  Discard plastic applicator. Insert a blueberry size amount (approximately 1 gram) of cream on  fingertip inside vagina at bedtime every night for 1 week then every other night. For long term use.   fluticasone  50 MCG/ACT nasal spray Commonly known as: FLONASE  Place 2 sprays into both nostrils 2 (two) times daily.   Gemtesa  75 MG Tabs Generic drug: Vibegron  Take 1 tablet (75 mg total) by mouth daily.   ibuprofen  200 MG tablet Commonly known as: ADVIL  Take 400 mg by mouth every 6 (six) hours as needed for moderate pain.   meloxicam 15 MG tablet Commonly known as: MOBIC Take 15 mg by mouth daily as needed.   montelukast  10 MG tablet Commonly known as: SINGULAIR  Take 1 tablet (10 mg total) by mouth at bedtime.   NON FORMULARY Pt uses a bipap with 4L of oxygen  when laying down   OXYGEN  Inhale 3.5-7 L into the lungs continuous.   roflumilast  500 MCG Tabs tablet Commonly known as: DALIRESP  Take 500 mcg by mouth daily.   solifenacin  5 MG tablet Commonly known as: VESICARE  Take 1 tablet (5 mg total) by mouth daily.   Trelegy Ellipta  100-62.5-25 MCG/ACT Aepb Generic drug: Fluticasone -Umeclidin-Vilant Inhale 1 puff into the lungs daily.   valACYclovir  500 MG tablet Commonly known  as: VALTREX  Take 500 mg by mouth daily.   Vitamin D (Ergocalciferol) 1.25 MG (50000 UNIT) Caps capsule Commonly known as: DRISDOL Take 50,000 Units by mouth once a week.        Allergies:  Allergies  Allergen Reactions   Ketek [Telithromycin] Other (See Comments)    Loopy, eyes went different directions   Penicillins Other (See Comments)    Unknown, childhood allergy   Adhesive [Tape] Itching and Rash    Break out   Chantix [Varenicline] Palpitations    Family History: Family History  Problem Relation Age of Onset   Asthma Mother    Heart attack Mother    Diabetes Mother    Heart failure Mother    Colon cancer Neg Hx     Social History:  reports that she quit smoking about 8 years ago. Her smoking use included cigarettes. She started smoking about 48 years ago. She has a 40 pack-year smoking history. She has been exposed to tobacco smoke. She has never used smokeless tobacco. She reports current alcohol  use. She reports that she does not use drugs.  Blood pressure 113/64, pulse 96. NED. A&Ox3.   No respiratory distress   Abd soft, NT, ND Normal external genitalia with patent urethral meatus.  No prolapse.  Chaperone for exam and cystoscopy: Alisheya  Cystoscopy Procedure Note  Patient identification was confirmed, informed consent was obtained, and patient was prepped using Betadine  solution.  Lidocaine  jelly was administered per urethral meatus.    Procedure: - Flexible cystoscope introduced, without any difficulty.   - Thorough search of the bladder revealed:    normal urethral meatus    normal urothelium    no stones    no ulcers     no tumors    no urethral polyps    no trabeculation  - Ureteral orifices were normal in position and appearance.  Post-Procedure: - Patient tolerated the procedure well   Laboratory Data: Lab Results  Component Value Date   WBC 11.7 (H) 02/24/2023   HGB 11.9 (L) 02/24/2023   HCT 37.3 02/24/2023   MCV 98.9  02/24/2023   PLT 319 02/24/2023    Lab Results  Component Value Date   CREATININE 0.61 02/24/2023    No results found for: PSA  No results found  for: TESTOSTERONE  Lab Results  Component Value Date   HGBA1C 5.9 (H) 07/02/2022    Urinalysis    Component Value Date/Time   COLORURINE STRAW (A) 02/20/2023 1050   APPEARANCEUR Clear 03/08/2024 1243   LABSPEC 1.004 (L) 02/20/2023 1050   PHURINE 6.0 02/20/2023 1050   GLUCOSEU Negative 03/08/2024 1243   HGBUR MODERATE (A) 02/20/2023 1050   BILIRUBINUR Negative 03/08/2024 1243   KETONESUR NEGATIVE 02/20/2023 1050   PROTEINUR Negative 03/08/2024 1243   PROTEINUR NEGATIVE 02/20/2023 1050   UROBILINOGEN 0.2 01/20/2015 1957   NITRITE Negative 03/08/2024 1243   NITRITE NEGATIVE 02/20/2023 1050   LEUKOCYTESUR 3+ (A) 03/08/2024 1243   LEUKOCYTESUR SMALL (A) 02/20/2023 1050    Lab Results  Component Value Date   LABMICR See below: 03/08/2024   WBCUA >30 (A) 03/08/2024   LABEPIT 0-10 03/08/2024   BACTERIA None seen 03/08/2024    Pertinent Imaging: N/a  Results for orders placed during the hospital encounter of 06/02/23  US  RENAL  Narrative : PROCEDURE: US  RENAL  HISTORY: Patient is a 68 y/o  F with recurrent UTI.  Bladder spasms.  COMPARISON: CT [facility] 08/17/2021.  TECHNIQUE: Two-dimensional grayscale and color Doppler ultrasound of the kidneys was performed.  FINDINGS: The urinary bladder demonstrates normal anechoic echogenicity. The bilateral ureteral jets are not visualized.  The right kidney measures 9.3 x 5.9 x 4.5 cm. Renal cortical echotexture is normal. There is no hydronephrosis. There are no stones. There are no cysts.  The left kidney measures 10.3 x 5.9 x 5.0 cm. Renal cortical echotexture is normal. There is no hydronephrosis. There are no stones. There are no cysts.  IMPRESSION: 1.  Unremarkable ultrasound of the kidneys.  No hydronephrosis.  Thank you for allowing us  to assist in the care of  this patient.   Electronically Signed By: Lynwood Mains M.D. On: 06/02/2023 21:29  No results found for this or any previous visit.  No results found for this or any previous visit.  No results found for this or any previous visit.   Assessment & Plan:    1. Microscopic hematuria (Primary) Benign evaluation.  Urine clear today in a much better visualization.  I recommended if she continues to have some vision changes or they worsen to stop solifenacin  and follow-up with her optometrist for repeat eye exam.   No follow-ups on file.  Donnice Brooks, MD  [facility]  [address] [place], [place] [address] [phone]

## 2024-04-06 ENCOUNTER — Other Ambulatory Visit: Payer: Self-pay | Admitting: Cardiology

## 2024-04-09 ENCOUNTER — Encounter: Payer: Self-pay | Admitting: Cardiology

## 2024-04-12 ENCOUNTER — Telehealth: Payer: Self-pay

## 2024-04-12 ENCOUNTER — Encounter: Payer: Self-pay | Admitting: Urology

## 2024-04-12 NOTE — Telephone Encounter (Signed)
 Refill request has been sent to MD mychart message was sent to pt

## 2024-04-20 ENCOUNTER — Other Ambulatory Visit (HOSPITAL_COMMUNITY): Payer: Self-pay | Admitting: Internal Medicine

## 2024-04-20 DIAGNOSIS — N63 Unspecified lump in unspecified breast: Secondary | ICD-10-CM

## 2024-04-20 DIAGNOSIS — R928 Other abnormal and inconclusive findings on diagnostic imaging of breast: Secondary | ICD-10-CM

## 2024-05-18 ENCOUNTER — Encounter (HOSPITAL_COMMUNITY): Payer: Self-pay

## 2024-05-18 ENCOUNTER — Ambulatory Visit (HOSPITAL_COMMUNITY)
Admission: RE | Admit: 2024-05-18 | Discharge: 2024-05-18 | Disposition: A | Source: Ambulatory Visit | Attending: Internal Medicine | Admitting: Internal Medicine

## 2024-05-18 ENCOUNTER — Ambulatory Visit (HOSPITAL_COMMUNITY)
Admission: RE | Admit: 2024-05-18 | Discharge: 2024-05-18 | Disposition: A | Discharge status: Home / Self Care | Source: Ambulatory Visit | Attending: Internal Medicine | Admitting: Internal Medicine

## 2024-05-18 DIAGNOSIS — N63 Unspecified lump in unspecified breast: Secondary | ICD-10-CM

## 2024-05-18 DIAGNOSIS — R928 Other abnormal and inconclusive findings on diagnostic imaging of breast: Secondary | ICD-10-CM | POA: Diagnosis present

## 2025-04-25 ENCOUNTER — Ambulatory Visit: Admitting: Urology
